# Patient Record
Sex: Male | Born: 1935 | Race: White | Hispanic: No | Smoking: Never smoker
Health system: Southern US, Community
[De-identification: ages and names within clinical notes are randomized; demographics above are authoritative.]

## PROBLEM LIST (undated history)

## (undated) DIAGNOSIS — G4730 Sleep apnea, unspecified: Secondary | ICD-10-CM

## (undated) DIAGNOSIS — C61 Malignant neoplasm of prostate: Secondary | ICD-10-CM

## (undated) DIAGNOSIS — R3915 Urgency of urination: Secondary | ICD-10-CM

## (undated) DIAGNOSIS — I639 Cerebral infarction, unspecified: Secondary | ICD-10-CM

## (undated) DIAGNOSIS — N400 Enlarged prostate without lower urinary tract symptoms: Secondary | ICD-10-CM

## (undated) DIAGNOSIS — K429 Umbilical hernia without obstruction or gangrene: Secondary | ICD-10-CM

## (undated) DIAGNOSIS — N419 Inflammatory disease of prostate, unspecified: Secondary | ICD-10-CM

## (undated) DIAGNOSIS — I509 Heart failure, unspecified: Secondary | ICD-10-CM

## (undated) DIAGNOSIS — E119 Type 2 diabetes mellitus without complications: Secondary | ICD-10-CM

## (undated) DIAGNOSIS — D560 Alpha thalassemia: Secondary | ICD-10-CM

## (undated) DIAGNOSIS — R600 Localized edema: Secondary | ICD-10-CM

## (undated) DIAGNOSIS — D649 Anemia, unspecified: Secondary | ICD-10-CM

## (undated) DIAGNOSIS — I10 Essential (primary) hypertension: Secondary | ICD-10-CM

## (undated) DIAGNOSIS — E7800 Pure hypercholesterolemia, unspecified: Secondary | ICD-10-CM

## (undated) DIAGNOSIS — B351 Tinea unguium: Secondary | ICD-10-CM

## (undated) HISTORY — DX: Inflammatory disease of prostate, unspecified: N41.9

## (undated) HISTORY — DX: Alpha thalassemia: D56.0

## (undated) HISTORY — DX: Sleep apnea, unspecified: G47.30

## (undated) HISTORY — DX: Urgency of urination: R39.15

## (undated) HISTORY — DX: Heart failure, unspecified: I50.9

## (undated) HISTORY — PX: HERNIA REPAIR: SHX51

## (undated) HISTORY — PX: VASECTOMY: SHX75

## (undated) HISTORY — PX: MASTOIDECTOMY: SHX711

## (undated) HISTORY — DX: Benign prostatic hyperplasia without lower urinary tract symptoms: N40.0

## (undated) HISTORY — PX: ADENOIDECTOMY: SUR15

## (undated) HISTORY — PX: TONSILLECTOMY: SUR1361

## (undated) HISTORY — DX: Localized edema: R60.0

## (undated) HISTORY — DX: Tinea unguium: B35.1

---

## 1999-09-15 ENCOUNTER — Ambulatory Visit (HOSPITAL_COMMUNITY): Admission: RE | Admit: 1999-09-15 | Discharge: 1999-09-15 | Payer: Self-pay | Admitting: Gastroenterology

## 1999-09-15 ENCOUNTER — Encounter (INDEPENDENT_AMBULATORY_CARE_PROVIDER_SITE_OTHER): Payer: Self-pay

## 2002-05-08 ENCOUNTER — Inpatient Hospital Stay (HOSPITAL_COMMUNITY): Admission: EM | Admit: 2002-05-08 | Discharge: 2002-05-13 | Payer: Self-pay | Admitting: Emergency Medicine

## 2002-05-08 ENCOUNTER — Encounter: Payer: Self-pay | Admitting: Neurology

## 2002-05-08 ENCOUNTER — Encounter: Payer: Self-pay | Admitting: Emergency Medicine

## 2002-05-09 ENCOUNTER — Encounter: Payer: Self-pay | Admitting: Neurology

## 2002-05-09 ENCOUNTER — Encounter: Payer: Self-pay | Admitting: Cardiology

## 2002-06-05 ENCOUNTER — Inpatient Hospital Stay (HOSPITAL_COMMUNITY): Admission: EM | Admit: 2002-06-05 | Discharge: 2002-06-06 | Payer: Self-pay | Admitting: Emergency Medicine

## 2002-06-05 ENCOUNTER — Encounter: Payer: Self-pay | Admitting: Emergency Medicine

## 2002-08-25 ENCOUNTER — Ambulatory Visit (HOSPITAL_COMMUNITY): Admission: RE | Admit: 2002-08-25 | Discharge: 2002-08-25 | Payer: Self-pay | Admitting: Gastroenterology

## 2004-08-16 ENCOUNTER — Ambulatory Visit: Admission: RE | Admit: 2004-08-16 | Discharge: 2004-09-01 | Payer: Self-pay | Admitting: Radiation Oncology

## 2004-10-05 ENCOUNTER — Ambulatory Visit: Admission: RE | Admit: 2004-10-05 | Discharge: 2004-10-05 | Payer: Self-pay | Admitting: Radiation Oncology

## 2004-10-06 ENCOUNTER — Ambulatory Visit: Admission: RE | Admit: 2004-10-06 | Discharge: 2005-01-04 | Payer: Self-pay | Admitting: Radiation Oncology

## 2004-10-10 ENCOUNTER — Emergency Department (HOSPITAL_COMMUNITY): Admission: EM | Admit: 2004-10-10 | Discharge: 2004-10-10 | Payer: Self-pay | Admitting: Emergency Medicine

## 2005-02-16 ENCOUNTER — Ambulatory Visit: Admission: RE | Admit: 2005-02-16 | Discharge: 2005-02-16 | Payer: Self-pay | Admitting: Radiation Oncology

## 2007-02-20 ENCOUNTER — Encounter: Admission: RE | Admit: 2007-02-20 | Discharge: 2007-02-20 | Payer: Self-pay | Admitting: Internal Medicine

## 2007-02-20 ENCOUNTER — Inpatient Hospital Stay (HOSPITAL_COMMUNITY): Admission: AD | Admit: 2007-02-20 | Discharge: 2007-02-25 | Payer: Self-pay | Admitting: Internal Medicine

## 2007-11-08 ENCOUNTER — Encounter: Admission: RE | Admit: 2007-11-08 | Discharge: 2007-11-08 | Payer: Self-pay | Admitting: Cardiology

## 2007-11-18 ENCOUNTER — Ambulatory Visit (HOSPITAL_COMMUNITY): Admission: RE | Admit: 2007-11-18 | Discharge: 2007-11-18 | Payer: Self-pay | Admitting: Cardiology

## 2008-06-03 ENCOUNTER — Inpatient Hospital Stay (HOSPITAL_COMMUNITY): Admission: EM | Admit: 2008-06-03 | Discharge: 2008-06-05 | Payer: Self-pay | Admitting: Emergency Medicine

## 2008-06-04 ENCOUNTER — Ambulatory Visit: Payer: Self-pay | Admitting: Vascular Surgery

## 2008-06-04 ENCOUNTER — Encounter (INDEPENDENT_AMBULATORY_CARE_PROVIDER_SITE_OTHER): Payer: Self-pay | Admitting: Internal Medicine

## 2009-03-19 ENCOUNTER — Inpatient Hospital Stay (HOSPITAL_COMMUNITY): Admission: EM | Admit: 2009-03-19 | Discharge: 2009-03-21 | Payer: Self-pay | Admitting: Emergency Medicine

## 2010-12-07 ENCOUNTER — Inpatient Hospital Stay (HOSPITAL_COMMUNITY)
Admission: EM | Admit: 2010-12-07 | Discharge: 2010-12-11 | Payer: Self-pay | Source: Home / Self Care | Attending: Internal Medicine | Admitting: Internal Medicine

## 2010-12-12 LAB — CBC
HCT: 39.1 % (ref 39.0–52.0)
Hemoglobin: 12.3 g/dL — ABNORMAL LOW (ref 13.0–17.0)
MCH: 21.8 pg — ABNORMAL LOW (ref 26.0–34.0)
MCHC: 31.5 g/dL (ref 30.0–36.0)
MCV: 69.4 fL — ABNORMAL LOW (ref 78.0–100.0)
Platelets: 200 10*3/uL (ref 150–400)
RBC: 5.63 MIL/uL (ref 4.22–5.81)
RDW: 14.2 % (ref 11.5–15.5)
WBC: 9.7 10*3/uL (ref 4.0–10.5)

## 2010-12-12 LAB — COMPREHENSIVE METABOLIC PANEL
ALT: 21 U/L (ref 0–53)
AST: 25 U/L (ref 0–37)
Albumin: 3.8 g/dL (ref 3.5–5.2)
Alkaline Phosphatase: 47 U/L (ref 39–117)
BUN: 16 mg/dL (ref 6–23)
CO2: 32 mEq/L (ref 19–32)
Calcium: 9.9 mg/dL (ref 8.4–10.5)
Chloride: 99 mEq/L (ref 96–112)
Creatinine, Ser: 1.2 mg/dL (ref 0.4–1.5)
GFR calc Af Amer: >60 mL/min (ref >60)
GFR calc non Af Amer: 59 mL/min — ABNORMAL LOW (ref >60)
Glucose, Bld: 127 mg/dL — ABNORMAL HIGH (ref 70–99)
Potassium: 4 mEq/L (ref 3.5–5.1)
Sodium: 141 mEq/L (ref 135–145)
Total Bilirubin: 0.5 mg/dL (ref 0.3–1.2)
Total Protein: 6.7 g/dL (ref 6.0–8.3)

## 2010-12-12 LAB — CARDIAC PANEL(CRET KIN+CKTOT+MB+TROPI)
CK, MB: 1.9 ng/mL (ref 0.3–4.0)
Relative Index: 1 (ref 0.0–2.5)
Total CK: 188 U/L (ref 7–232)
Total CK: 188 U/L (ref 7–232)
Total CK: 141 U/L (ref 7–232)
Troponin I: 0.02 ng/mL (ref 0.00–0.06)

## 2010-12-12 LAB — ACETAMINOPHEN LEVEL: Acetaminophen (Tylenol), Serum: <10 ug/mL — ABNORMAL LOW (ref 10–30)

## 2010-12-12 LAB — GLUCOSE, CAPILLARY
Glucose-Capillary: 192 mg/dL — ABNORMAL HIGH (ref 70–99)
Glucose-Capillary: 139 mg/dL — ABNORMAL HIGH (ref 70–99)
Glucose-Capillary: 150 mg/dL — ABNORMAL HIGH (ref 70–99)
Glucose-Capillary: 157 mg/dL — ABNORMAL HIGH (ref 70–99)
Glucose-Capillary: 170 mg/dL — ABNORMAL HIGH (ref 70–99)
Glucose-Capillary: 155 mg/dL — ABNORMAL HIGH (ref 70–99)
Glucose-Capillary: 179 mg/dL — ABNORMAL HIGH (ref 70–99)

## 2010-12-12 LAB — DIFFERENTIAL
Basophils Absolute: 0 10*3/uL (ref 0.0–0.1)
Basophils Relative: 0 % (ref 0–1)
Eosinophils Absolute: 0.2 10*3/uL (ref 0.0–0.7)
Eosinophils Relative: 2 % (ref 0–5)
Lymphocytes Relative: 14 % (ref 12–46)
Lymphs Abs: 1.4 10*3/uL (ref 0.7–4.0)
Monocytes Absolute: 1.1 10*3/uL — ABNORMAL HIGH (ref 0.1–1.0)
Monocytes Relative: 12 % (ref 3–12)
Neutro Abs: 6.9 10*3/uL (ref 1.7–7.7)
Neutrophils Relative %: 72 % (ref 43–77)

## 2010-12-12 LAB — URINALYSIS, ROUTINE W REFLEX MICROSCOPIC
Bilirubin Urine: NEGATIVE
Hgb urine dipstick: NEGATIVE
Ketones, ur: NEGATIVE mg/dL
Nitrite: NEGATIVE
Protein, ur: NEGATIVE mg/dL
Specific Gravity, Urine: 1.017 (ref 1.005–1.030)
Urine Glucose, Fasting: NEGATIVE mg/dL
Urobilinogen, UA: 1 mg/dL (ref 0.0–1.0)
pH: 7.5 (ref 5.0–8.0)

## 2010-12-12 LAB — CK TOTAL AND CKMB (NOT AT ARMC)
CK, MB: 2.1 ng/mL (ref 0.3–4.0)
Relative Index: 1.2 (ref 0.0–2.5)

## 2010-12-12 LAB — URINE CULTURE: Culture  Setup Time: 201201190030

## 2010-12-12 LAB — RAPID URINE DRUG SCREEN, HOSP PERFORMED
Barbiturates: NOT DETECTED
Opiates: POSITIVE — AB

## 2010-12-12 LAB — PROTIME-INR
INR: 2.3 — ABNORMAL HIGH (ref 0.00–1.49)
INR: 2.37 — ABNORMAL HIGH (ref 0.00–1.49)
Prothrombin Time: 25.4 seconds — ABNORMAL HIGH (ref 11.6–15.2)
Prothrombin Time: 26 seconds — ABNORMAL HIGH (ref 11.6–15.2)

## 2010-12-12 LAB — VITAMIN B12: Vitamin B-12: 340 pg/mL (ref 211–911)

## 2010-12-12 LAB — SEDIMENTATION RATE: Sed Rate: 2 mm/hr (ref 0–16)

## 2010-12-12 LAB — TROPONIN I: Troponin I: <0.01 ng/mL (ref 0.00–0.06)

## 2010-12-12 LAB — APTT: aPTT: 43 seconds — ABNORMAL HIGH (ref 24–37)

## 2010-12-13 LAB — DIFFERENTIAL
Basophils Relative: 0 % (ref 0–1)
Eosinophils Absolute: 0.3 10*3/uL (ref 0.0–0.7)
Eosinophils Relative: 4 % (ref 0–5)
Lymphs Abs: 1.6 10*3/uL (ref 0.7–4.0)
Monocytes Absolute: 1.2 10*3/uL — ABNORMAL HIGH (ref 0.1–1.0)

## 2010-12-13 LAB — GLUCOSE, CAPILLARY
Glucose-Capillary: 162 mg/dL — ABNORMAL HIGH (ref 70–99)
Glucose-Capillary: 149 mg/dL — ABNORMAL HIGH (ref 70–99)
Glucose-Capillary: 139 mg/dL — ABNORMAL HIGH (ref 70–99)
Glucose-Capillary: 174 mg/dL — ABNORMAL HIGH (ref 70–99)

## 2010-12-13 LAB — PROTEIN ELECTROPHORESIS, SERUM
Albumin ELP: 57.9 % (ref 55.8–66.1)
Alpha-1-Globulin: 4.4 % (ref 2.9–4.9)
Alpha-2-Globulin: 13.5 % — ABNORMAL HIGH (ref 7.1–11.8)
Beta 2: 5.1 % (ref 3.2–6.5)

## 2010-12-13 LAB — CBC
MCH: 21.4 pg — ABNORMAL LOW (ref 26.0–34.0)
MCV: 67.9 fL — ABNORMAL LOW (ref 78.0–100.0)
Platelets: 218 10*3/uL (ref 150–400)
RDW: 14.4 % (ref 11.5–15.5)
WBC: 8.6 10*3/uL (ref 4.0–10.5)

## 2010-12-13 LAB — VITAMIN D 1,25 DIHYDROXY: Vitamin D3 1, 25 (OH)2: 43 pg/mL

## 2010-12-13 LAB — PROTIME-INR
INR: 2.05 — ABNORMAL HIGH (ref 0.00–1.49)
INR: 2.55 — ABNORMAL HIGH (ref 0.00–1.49)
Prothrombin Time: 27.5 seconds — ABNORMAL HIGH (ref 11.6–15.2)

## 2010-12-27 NOTE — Discharge Summary (Signed)
Jared Hall, Jared Hall                   ACCOUNT NO.:  [id number]  MEDICAL RECORD NO.:  [id number]          PATIENT TYPE:  INP  LOCATION:  3737                         FACILITY:  [facility]  PHYSICIAN:  Rosalyn Gess. Norins, MD  DATE OF BIRTH:  January 28, 1931  DATE OF ADMISSION:  12/07/2010 DATE OF DISCHARGE:  12/11/2010                              DISCHARGE SUMMARY   ADMITTING DIAGNOSES: 1. Fall. 2. Chronic venous stasis. 3. Diabetes. 4. Hypertension. 5. Paroxysmal atrial fibrillation. 6. Benign prostatic hypertrophy. 7. Hyperlipidemia. 8. Peripheral neuropathy. 9. Obesity.  DISCHARGE DIAGNOSES: 1. Fall. 2. Chronic venous stasis. 3. Diabetes. 4. Hypertension. 5. Paroxysmal atrial fibrillation. 6. Benign prostatic hypertrophy. 7. Hyperlipidemia. 8. Peripheral neuropathy. 9. Obesity.  CONSULTANTS:  None.  PROCEDURES: 1. CT scan of the cervical spine without contrast performed at     admission which showed no evidence of acute cervical spine     fracture, traumatic subluxation, or static signs of instability.     He had diffuse spondylosis. 2. CT scan of the head without contrast which showed no acute     intracranial or calvarial findings.  Stable small-vessel ischemic     changes and old right cerebellar infarct is noted. 3. 2-D echo performed on January 21 which revealed left ventricular     wall thickness to be increased consistent with moderate LVH.  EF     was estimated at 60 to 65% with normal wall motion with no wall     motion abnormalities.  Doppler parameters are consistent with     abnormal left ventricular relaxation, grade 1 diastolic     dysfunction.  Aortic valve with trivial regurgitation.  Mitral     valve with calcified annulus.  Left atrium was mildly dilated,     right ventricle cavity size was mildly dilated.  HISTORY OF PRESENT ILLNESS:  The patient is an obese 77 year old Caucasian male followed by Dr. [name] with a history of  diabetes, hypertension, chronic venous stasis, and PAF who is anticoagulated.  He was seen in the emergency department on the day of admission with difficulty in ambulation as a baseline and a fall at home.  His fall was actually when he hit his foot on a chair and tripped with no loss of consciousness, no blurry vision, no chest pain, chest discomfort, or palpitations.  The patient reports he had 2 episodes of fall.  After the fall, he had extreme difficulty in getting up and ambulating and subsequently called on today to alert the EMS who brought the patient to the hospital.  Please see the H and P as well as the attached notes from Dr. Kevan Hall' office for past medical history, family history, social history, medications, and physical exam.  HOSPITAL COURSE:  The patient was admitted to a telemetry floor.  He remained in stable rate-controlled atrial fibrillation.  He was awake, alert, and was able to ambulate.  He was seen by physical therapy who recommended 24 hours supervision at discharge along with home health physical therapy.  With adequate supervision, skilled care was recommended.  The patient remained otherwise medically stable.  The patient is adamant about not going to a skilled care facility wants to remain in his own home.  Conversation was had with his son and family has agreed to arrange to look in on him or check with him by phone during the day since they all work.  I have advised the family to consider life alert to have for the patient when he is not attended.  We will arrange for home health physical therapy, occupational therapy, as well as fall risk assessment and recommendations for this patient.  The patient is being medically stable, and thought to be ready for discharge home.  DISCHARGE MEDICATIONS:  The patient will resume all his home medications per the medication reconciliation discharge form.  The patient will be seen by Dr. [name] in followup  in 10-14 days.  We will request home health as noted.  DISCHARGE EXAMINATION:  VITAL SIGNS:  Temperature 98.5, blood pressure 133/71, heart rate 62, respirations 18, and O2 sats 97% on room air. Orthostatic vital signs with a blood pressure of 133/71, pulse 62 lying down, blood pressure 129/70 with pulse of 83 sitting, blood pressure 110/64 with a pulse of 87 standing. GENERAL APPEARANCE:  This is an obese Caucasian elderly male sitting in a chair in no acute distress. HEENT:  Unremarkable. CHEST:  Patient is moving air well with no rales, wheezes, or rhonchi. No increased work of breathing. CARDIOVASCULAR:  2+ radial pulses.  Precordium was quiet.  He had a distant heart sound that was irregularly irregular but rate controlled. ABDOMEN:  Protuberant, positive bowel sounds were noted.  No guarding or rebound was noted.  No further examination conducted.  FINAL LABORATORY:  INR on the day of discharge was 2.55 and therapeutic. CBC from December 10, 2010, with a white count of 8600, hemoglobin 11.6 grams, hematocrit 36.8%, platelet count 280,000.  Differential was normal.  BNP from January 21 was less than 30.  Cardiac panels were negative with no evidence of cardiac injury.  B12 on December 08, 2010, was normal at 340.  TSH on January 19 was normal at 0.99.  ESR on January 19 was normal at 2.  DISPOSITION:  The patient to be discharged home in care of his family with home health as noted.  The patient's condition is stable but guarded given his multiple comorbidities.  Thank you very much for your assistance.     Rosalyn Gess Norins, MD     MEN/MEDQ  D:  12/11/2010  T:  12/12/2010  Job:  [id number]  cc:   Candyce Churn, M.D.  Electronically Signed by Illene Regulus MD on 12/27/2010 07:24:11 AM

## 2011-02-28 LAB — PROTIME-INR
INR: 3 — ABNORMAL HIGH (ref 0.00–1.49)
INR: 3.1 — ABNORMAL HIGH (ref 0.00–1.49)

## 2011-02-28 LAB — BASIC METABOLIC PANEL
CO2: 33 mEq/L — ABNORMAL HIGH (ref 19–32)
Chloride: 104 mEq/L (ref 96–112)
Creatinine, Ser: 1.13 mg/dL (ref 0.4–1.5)
GFR calc Af Amer: >60 mL/min (ref >60)
Glucose, Bld: 166 mg/dL — ABNORMAL HIGH (ref 70–99)

## 2011-02-28 LAB — GLUCOSE, CAPILLARY
Glucose-Capillary: 109 mg/dL — ABNORMAL HIGH (ref 70–99)
Glucose-Capillary: 162 mg/dL — ABNORMAL HIGH (ref 70–99)
Glucose-Capillary: 176 mg/dL — ABNORMAL HIGH (ref 70–99)
Glucose-Capillary: 213 mg/dL — ABNORMAL HIGH (ref 70–99)

## 2011-03-01 LAB — BASIC METABOLIC PANEL
BUN: 26 mg/dL — ABNORMAL HIGH (ref 6–23)
Calcium: 9 mg/dL (ref 8.4–10.5)
Chloride: 98 mEq/L (ref 96–112)
GFR calc Af Amer: >60 mL/min (ref >60)
GFR calc non Af Amer: 55 mL/min — ABNORMAL LOW (ref >60)
GFR calc non Af Amer: >60 mL/min (ref >60)
Potassium: 3 mEq/L — ABNORMAL LOW (ref 3.5–5.1)
Sodium: 137 mEq/L (ref 135–145)

## 2011-03-01 LAB — COMPREHENSIVE METABOLIC PANEL
ALT: 28 U/L (ref 0–53)
AST: 27 U/L (ref 0–37)
Albumin: 3.5 g/dL (ref 3.5–5.2)
Alkaline Phosphatase: 73 U/L (ref 39–117)
Chloride: 98 mEq/L (ref 96–112)
Creatinine, Ser: 1.98 mg/dL — ABNORMAL HIGH (ref 0.4–1.5)
GFR calc Af Amer: 40 mL/min — ABNORMAL LOW (ref >60)
Potassium: 3.5 mEq/L (ref 3.5–5.1)
Sodium: 138 mEq/L (ref 135–145)
Total Bilirubin: 0.5 mg/dL (ref 0.3–1.2)

## 2011-03-01 LAB — URINALYSIS, ROUTINE W REFLEX MICROSCOPIC
Nitrite: NEGATIVE
Protein, ur: NEGATIVE mg/dL
Specific Gravity, Urine: 1.022 (ref 1.005–1.030)
Urobilinogen, UA: 1 mg/dL (ref 0.0–1.0)

## 2011-03-01 LAB — POCT CARDIAC MARKERS
Troponin i, poc: <0.05 ng/mL (ref 0.00–0.09)
Troponin i, poc: <0.05 ng/mL (ref 0.00–0.09)

## 2011-03-01 LAB — PROTIME-INR: INR: 2.8 — ABNORMAL HIGH (ref 0.00–1.49)

## 2011-03-01 LAB — DIFFERENTIAL
Basophils Absolute: 0 10*3/uL (ref 0.0–0.1)
Lymphs Abs: 0.9 10*3/uL (ref 0.7–4.0)
Monocytes Relative: 10 % (ref 3–12)
Neutro Abs: 6.4 10*3/uL (ref 1.7–7.7)

## 2011-03-01 LAB — HEMOGLOBIN A1C: Mean Plasma Glucose: 212 mg/dL

## 2011-03-01 LAB — CBC
Platelets: 212 10*3/uL (ref 150–400)
WBC: 8.3 10*3/uL (ref 4.0–10.5)

## 2011-03-01 LAB — GLUCOSE, CAPILLARY
Glucose-Capillary: 147 mg/dL — ABNORMAL HIGH (ref 70–99)
Glucose-Capillary: 154 mg/dL — ABNORMAL HIGH (ref 70–99)
Glucose-Capillary: 196 mg/dL — ABNORMAL HIGH (ref 70–99)

## 2011-04-04 NOTE — Discharge Summary (Signed)
Jared Hall, Jared Hall                   ACCOUNT NO.:  [id number]   MEDICAL RECORD NO.:  [id number]          PATIENT TYPE:  INP   LOCATION:  4729                         FACILITY:  [facility]   PHYSICIAN:  Candyce Churn, M.D.DATE OF BIRTH:  [birth year]   DATE OF ADMISSION:  06/03/2008  DATE OF DISCHARGE:  06/05/2008                               DISCHARGE SUMMARY   DISCHARGE DIAGNOSES:  1. Syncope, likely multifactorial including relative dehydration.  No      further recurrences after 48 hours observation.  2. Mild nocturnal bradycardia, asymptomatic.  3. Azotemia with a BUN of 31, creatinine 1.7 on admission.  4. Mild dehydration.  5. Hyperlipidemia.  6. History of B12 deficiency.  7. 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 Gastroesophageal reflux disease.  9. Chest pain syndrome in the past felt to be secondary to      gastroesophageal reflux disease.  10.Sleep apnea with chronic nocturnal O2 therapy.  11.Severe obesity.  12.Type 2 diabetes mellitus, insulin-requiring.  13.History of small-bowel obstruction with hospitalization in Mar 28, 2007.  Resolved spontaneously.  14.Degenerative joint disease of the knees.  15.The patient had work up for chest pain syndrome in July 2003      revealing no significant coronary artery disease.  16.History of recurrent prostatitis.  17.History of colonic polyps.   DISCHARGE MEDICATIONS:  1. Vesicare 5 mg p.o. at bedtime for BPH.  2. Discontinue metolazone.  3. Simvastatin 20 mg daily.  4. Finasteride 5 mg daily.  5. Coumadin 5 mg daily.  6. Glimepiride 4 mg daily.  7. Lasix 80 mg daily.  8. Potassium chloride 20 mEq b.i.d.  9. Vicodin 10/750, one tablet p.o. p.r.n. pain.  10.Retain Lantus 30 units subcu b.i.d.   HOSPITAL COURSE:  Mr. Settlemire is a very pleasant 77 year old male with  multiple medical issues who was bending over to tie up a trash can  plastic bag besides his wife's bed and when  he stood up from that, he  thinks that he passed out because he found himself sitting in a  reclining chair besides her bed half in and half out of the chair.  He  tried to stand up from the chair after resting and had near syncope.  At  that point an EMS was called and he was brought to [facility] Emergency  Room.  He was found to be relatively dehydrated and azotemic.  His blood  sugar after the initial syncopal episode was 314.   In the emergency room, a CT scan was performed of the brain revealing no  acute intracranial abnormalities.  INR was therapeutic at 2.0 for his  previous stroke.  He was admitted for observation and had no further  symptoms while hospitalized.  The EEG results are still pending.  Carotid Dopplers revealed no significant internal carotid artery  stenoses.  Results of 2-D echo are performed on June 04, 2008, and are  pending.  Again, results of EEG are pending.   DISCHARGE LABORATORIES:  Protime  2.1 seconds on June 05, 2008.  Cardiac  enzymes revealed mildly elevated CK 258 on June 04, 2008, but with a CK-  MB of 2.2 and a troponin of 0.01.  Four other sets of cardiac enzymes  were all normal except for mildly elevated CK at 200-340.  This is most  likely secondary to his fall into the chair more than likely.   TSH from June 04, 2008, was normal at 0.973.  C-MET from June 04, 2008,  reveals sodium 140, potassium 4.8, chloride 101, bicarb 33, glucose 156,  BUN 30, creatinine 1.43, bilirubin 1.6, alk phos 47, SGOT 60, SGPT 30,  total protein 5.8, albumin 3.3, and calcium 9.1.  CBC from June 04, 2008, revealed a white cell count 8900, hemoglobin 12.2, MCV low at 69  with known history of alpha thalassemia.  Platelet count 238,000.   The patient was discharged home in improved condition, able to sit up,  stand, and ambulate to the bathroom back in for without symptoms.  No  exertional chest or arm pain.   Plans will be to discontinue metolazone and  reassess  in 2 weeks.  We  will consider Holter monitor for nocturnal bradycardia.      Candyce Churn, M.D.  Electronically Signed     RNG/MEDQ  D:  06/05/2008  T:  06/05/2008  Job:  [id number]

## 2011-04-04 NOTE — H&P (Signed)
NAME:  Jared Hall, Jared Hall                   ACCOUNT NO.:  [id number]   MEDICAL RECORD NO.:  [id number]          PATIENT TYPE:  INP   LOCATION:  4729                         FACILITY:  [facility]   PHYSICIAN:  Kela Millin, M.D.DATE OF BIRTH:  03/17/1931   DATE OF ADMISSION:  06/03/2008  DATE OF DISCHARGE:                              HISTORY & PHYSICAL   PRIMARY CARE PHYSICIAN:  Candyce Churn, M.D.   CHIEF COMPLAINT:  Syncopal episodes x2.   HISTORY OF PRESENT ILLNESS:  The patient is an obese 77 year old white  male with a past medical history significant for type 2 diabetes,  hypertension, vitamin B-12 deficiency, history of chest pain syndrome in  the past diagnosed with GERD, history of CVA on chronic Coumadin,  degenerative disk disease, alpha thalassemia with low MCV, sleep apnea  on nocturnal O2, who presents with the above complaints.  He states that  he is usually the caregiver for his wife, and he was standing up trying  to give her some juice today, and when he turned around, everything went  black.  He fell onto with a nearby chair and was unconscious for an  unknown duration of time.  When he came to, he checked his blood  glucose; it was 314, and he gave himself his scheduled dose of Lantus.  He then sat in the chair for some time and later on, as he tried to get  up again from the chair, he again passed out, fell back into the chair,  and again lost consciousness.  He states that by the second time, his  daughter was at the house, and they called 9-1-1, but she is not  available at this time, and the patient does not know for how long he  was unconscious.  He reports that he was incontinent of urine after the  first episode.  He admits to feeling dizzy for the past 2-3 days.  He  denies palpitations, blurry vision, focal weakness, chest pain, fevers,  melena, hematochezia, diarrhea, nausea and vomiting, headaches and no  dysphagia.  He admits to feeling short of breath  and weak sometimes in  the past couple of days.  He denies leg swelling; also no PND.  He  states that when EMS came by and they checked his blood glucose (after  he had given himself Lantus) it was 195.   In the ER, a CT scan was done which showed no acute intracranial  findings and no mass lesions.  Vascular calcifications and  dolichoechasia of the left internal carotid artery was noted.  Periventricular white matter disease and remote lacunar type infarcts  also noted with cerebral and more marked cerebellar atrophy.  A  urinalysis was done and was negative for infection, and his INR was  therapeutic at 2.0.  Point of care markers negative x1.  His BUN was 31  with a creatinine of 1.7.  He is admitted for further evaluation and  management.   PAST MEDICAL HISTORY:  1. As above.  2. History of small bowel obstruction.  3. History of recurrent  prostatitis.  4. History of colonic polyps; last colonoscopy in 2003.   MEDICATIONS:  1. Metolazone 5 mg on Mondays, Wednesdays and Fridays.  2. Coumadin 5 mg.  3. Amaryl 4 mg.  4. Lasix 80 mg daily.  5. KCl 20.  6. Simvastatin 20 mg.  7. Vesicare 5 mg daily.  8. Vicodin 10/750 b.i.d. p.r.n.  9. Finasteride 5 mg daily.  10.Lantus 40 units in a.m. and 30 units in p.m. per patient report      (but on his Lantus package, it is listed as 30 units b.i.d.)   ALLERGIES:  No known drug allergies.   SOCIAL HISTORY:  He denies tobacco.  He also denies alcohol.   FAMILY HISTORY:  Reviewed and noncontributory to current illness.   REVIEW OF SYSTEMS:  As per HPI.  Other review of systems negative.   PHYSICAL EXAMINATION:  GENERAL:  The patient is an obese elderly white  male in no apparent distress.  VITAL SIGNS:  Temperature is 97, blood pressure 113/76, pulse 100,  respiratory rate of 20, O2 saturation 96%.  HEENT - Pupils equal, round and reactive to light.  Extraocular  movements intact.  Sclerae anicteric.  Slightly dry mucous membranes  and  no oral exudates.  NECK:  Supple.  No adenopathy, no thyromegaly, no JVD and no carotid  bruits appreciated.  LUNGS:  Decreased breath sounds at the bases.  No crackles and no  wheezes.  CARDIOVASCULAR:  Regular rate and rhythm.  Normal S1 and S2.  No S4  appreciated.  ABDOMEN:  Soft.  Bowel sounds present.  Nontender, nondistended.  No  organomegaly and no masses palpable.  EXTREMITIES:  No cyanosis and no edema.  NEUROLOGIC:  She is alert and oriented x3.  Cranial nerves II-XII  grossly intact.  Strength is 5/5 and symmetric.  Nonfocal.   LABORATORY DATA:  CT scan as per HPI.  Urinalysis is negative for  infection.  Sodium is 141 with a potassium of 3.6, chloride 99, BUN is  31, creatinine 1.7, glucose 151, ionized calcium 1.20.  The white cell  count is 9.1, hemoglobin 12.7, hematocrit 40.7, MCV is 70, platelet  count 255, neutrophil count 75%.  Point of care markers negative x1.  INR is 2.0.   ASSESSMENT AND PLAN:  1. Syncope - as discussed above.  CT scan of the head negative for      acute findings.  We will obtain serial cardiac enzymes, monitor on      telemetry.  Also a 2-D echo, carotid Doppler ultrasound and an EEG.      Will check orthostatic vital signs and a D-dimer.  Follow and      further manage as appropriate pending results.  2. Renal insufficiency.  Obtain baseline creatinine from office      records.  Will hold diuretics for now.  Conscious hydration.      Follow and recheck.  3. Diabetes.  Monitor Accu-Cheks.  Lantus.  Hold off Amaryl for now.      Monitor blood sugars and resume when appropriate.  4. Hypertension.  Was on diuretics; will hold as discussed above.      Monitor blood pressures and treat as appropriate.  5. Gastroesophageal reflux disease.  PPI.  6. History of cerebrovascular accident, on chronic Coumadin.  INR      therapeutic.  Continue monitoring.  7. History of sleep apnea.  Continue nocturnal O2.  8. History of degenerative joint  disease.  Continue outpatient  medications.  9. History of sleep apnea, on nocturnal O2.  10.History of alpha thalassemia , with low MCV as discussed above.      Kela Millin, M.D.  Electronically Signed     ACV/MEDQ  D:  06/04/2008  T:  06/04/2008  Job:  [id number]   cc:   Candyce Churn, M.D.

## 2011-04-04 NOTE — H&P (Signed)
Jared Hall, Jared Hall                   ACCOUNT NO.:  [id number]   MEDICAL RECORD NO.:  [id number]          PATIENT TYPE:  EMS   LOCATION:  MAJO                         FACILITY:  [facility]   PHYSICIAN:  Hollice Espy, M.D.DATE OF BIRTH:  1931/01/07   DATE OF ADMISSION:  03/18/2009  DATE OF DISCHARGE:                              HISTORY & PHYSICAL   ATTENDING PHYSICIAN:  Hollice Espy, M.D.   PRIMARY CARE PHYSICIAN:  Candyce Churn, M.D.   CHIEF COMPLAINT:  Fall and passing out.   HISTORY OF PRESENT ILLNESS:  The patient is a 77 year old white male  with past medical history of obesity, diabetes mellitus and atrial  fibrillation who early on today became dizzy while walking in the  bathroom.  He had been seen by his PCP and was having lower extremity  edema in the last couple of weeks and had some metolazone added.  Today  he felt lightheaded then fell forward on to his face.  Not sure if he  passed out.  If so it was for less than a minute.  He had just a little  bit of bleeding from his nose afterwards.  Paramedics were called and  when he arrived to the emergency room his bleeding had been stopped.  He  complained of some severe dizziness especially when standing upright,  nausea and he had some initial urinary incontinence as well.  The  patient's blood pressure, when he first came in he was noted to be  slightly tachycardic.  His vitals on admission noted a blood pressure of  140/71, heart rate of 97.  The patient was started on IV fluids.  When  sitting upright he starts to become tachycardic.  An EKG done showed  some normal sinus rhythm.  He has occasional ectopic beats and at one  point paramedics thought he might be in first degree block, although EKG  here is definitely normal sinus rhythm with occasional ectopic beats.  Lab work was done.  The patient was found to have a normal white count,  no shift, normal hemoglobin.  His BUN and creatinine looked to be  acutely elevated with a BUN of 39 and creatinine of 1.98 and a glucose  of 201.  His INR stayed therapeutic at 2.4.  CPKs showed greater than  500 but normal MB and troponins consistent with a fall.  When I saw the  patient he was complaining of some dizziness, some nausea, a mild  headache.  No vision changes.  No dysphagia.  No chest pain.  No  palpitations.  No shortness of breath, wheeze, cough, abdominal pain,  hematuria, dysuria, constipation, diarrhea.  He complains of some  bilateral leg cramping and feels overall quite fatigued.   REVIEW OF SYSTEMS:  Otherwise negative.   PAST MEDICAL HISTORY:  Includes obesity, diabetes mellitus,  hypertension, history of paroxysmal atrial fibrillation on chronic  anticoagulation, hyperlipidemia, BPH and peripheral neuropathy secondary  to diabetes.   MEDICATIONS:  1. He is on metolazone 5.  2. Coumadin 5.  3. Glimepiride 4 b.i.d.  4. Lasix  80.  5. K-Dur 20 mEq b.i.d.  6. Zocor 20.  7. Vesicare 5.  8. Vicodin 10/750 b.i.d.  9. Finasteride 5.  10.Lantus 30 units subcu b.i.d.  11.Lyrica daily.   ALLERGIES:  No known drug allergies.   SOCIAL HISTORY:  No tobacco, alcohol or drug use.   FAMILY HISTORY:  Noncontributory.   PHYSICAL EXAMINATION:  VITAL SIGNS:  On admission temperature 96.7,  heart rate 97 up to as high as 117, now down to 94, blood pressure  140/71 when sitting upright.  He drops down to about as low as 116/63.  Respirations 20, O2 sat 97% on 2 liters.  GENERAL:  He is alert and oriented x3 and in no apparent distress.  HEENT:  Normocephalic.  He has signs of dried nosebleed, recent fall.  Mucous membranes are slightly dry.  NECK:  He has no carotid bruits.  HEART:  Currently regular rate and rhythm, S1, S2, 2/6 systolic ejection  murmur.  LUNGS:  Clear to auscultation bilaterally.  ABDOMEN:  Soft, obese, nontender, positive bowel sounds.  EXTREMITIES:  Show no clubbing, cyanosis.  Got about 1+ pitting edema.    LAB WORK:  White count 8.3, H and H 13.1 and 41, MCV 70, platelet count  212 and no shift.  Sodium 138, potassium 3.5, chloride 98, bicarb 33,  BUN 39, creatinine 2, glucose 201.  LFTs are unremarkable.  INR is 2.4.  Urinalysis unremarkable.  CPK greater than 500, MB 1.5, troponin-I less  than 0.05.  CT scan of the head notes no evidence of intracranial  trauma.  Chest x-ray notes decreased sensitivity and specificity but  overall cardiomegaly, low lung volumes and probable left based  atelectasis.   ASSESSMENT AND PLAN:  1. Syncope felt to be secondary to #2.  2. Acute renal failure which is secondary to likely from over      diuresis.  Will hold his antihypertensives, hold his diuretics,      gently hydrate, resume Lasix in the morning.  PT/OT to see.  Pain      control.  Will also continue his Lantus and other medications      including his Coumadin.  His INR is therapeutic.  The patient      likely will be able to be discharged tomorrow.  Please note that      the patient is a DNR.      Hollice Espy, M.D.  Electronically Signed     SKK/MEDQ  D:  03/18/2009  T:  03/18/2009  Job:  [id number]

## 2011-04-04 NOTE — Procedures (Signed)
REFERRING PHYSICIAN:  Candyce Churn, MD   CLINICAL HISTORY:  A 77 year old patient being evaluated for syncope.   MEDICATIONS LISTED:  NovoLog, Lantus, Protonix, Zocor, Coumadin,  Vicodin, Phenergan, and Tylenol.   This is a portable EEG recorded with the patient awake using 17-channel  machine and standard 10/20 electrode placement.   Background awake rhythm consists of 7-8 Hz alpha which is of diminished  amplitude, reactive to eye-opening and closure.  He intermittent 6-7 Hz  theta slowing as seen bilaterally in diffuse and symmetric fashion.  No  paroxysmal epileptiform activity.  Spikes are noted.  Length of the  recording is 23.7 minutes.  Definite sleep stages are not seen.  EKG  tracing reveals regular sinus rhythm.  Hyperventilation and photic  stimulation not performed.   IMPRESSION:  This EEG performed during awake state is borderline  abnormal due to mild background slowing.  No definite epileptiform  features are noted.           ______________________________  Sunny Schlein. Pearlean Brownie, MD     ZOX:WRUE  D:  06/04/2008 18:17:53  T:  06/05/2008 08:55:16  Job #:  [id number]   cc:   Candyce Churn, M.D.  Fax: [phone]

## 2011-04-04 NOTE — Cardiovascular Report (Signed)
NAMEWYLEE, Jared Hall                   ACCOUNT NO.:  [id number]   MEDICAL RECORD NO.:  [id number]          PATIENT TYPE:  OIB   LOCATION:  2899                         FACILITY:  [facility]   PHYSICIAN:  Armanda Magic, M.D.     DATE OF BIRTH:  01-Oct-1931   DATE OF PROCEDURE:  11/18/2007  DATE OF DISCHARGE:  11/18/2007                            CARDIAC CATHETERIZATION   PROCEDURES:  1. Left heart catheterization.  2. Coronary angiography.  3. Left ventriculography.   OPERATOR:  Armanda Magic, M.D.   INDICATIONS:  Shortness of breath and abnormal Cardiolite.   COMPLICATIONS:  None.   IV ACCESS:  Via right femoral artery with a 6-French sheath   This is a very pleasant 77 year old male with a history of type 2  diabetes, hypertension, GERD.  She presents with congestive heart  failure and lower extremity edema.  An adenosine Cardiolite showed a  small area of inferoapical and inferolateral ischemia.  He now presents  for cardiac catheterization.   The patient was brought to cardiac catheterization laboratory in the  fasting nonsedated state.  Informed consent was obtained.  The patient  was connected to continuous heart rate and pulse oximetry monitoring,  and intermittent blood pressure monitoring.  The right groin was prepped  and draped in a sterile fashion.  Xylocaine 1% was used for local  anesthesia.  Using the modified Seldinger technique, a 6-French sheath  was placed in the right femoral artery.  Under fluoroscopic guidance, a  6-French JL-4 catheter was placed in the left coronary artery.  Multiple  cineographic films were taken at 30-degree RAO and 40-degree LAO views.  The catheter was then exchanged out over a guidewire for a 6-French JR-  4.  The catheter was placed under fluoroscopic guidance to the right  coronary artery.  Multiple cine films were taken at 30-degree RAO and 40-  degree LAO views.  This catheter was then exchanged out over a guidewire  for a 6-French  angled pigtail catheter; which was placed under  fluoroscopic guidance to the left ventricular cavity.   Left ventriculography was performed in the 30-degree RAO view, using a  total of 30 mL of contrast at 15 mL per second.  The catheter was then  pulled back across the aortic valve, with no significant gradient noted.   At the end procedure all catheters and sheaths were removed.  Manual  compression was performed until adequate hemostasis was obtained.  The  patient was transferred back to the room in stable condition.   RESULTS:  1. Left main coronary artery was widely patent.  It bifurcates in the      left anterior descending artery and left circumflex artery.  2. The left anterior descending artery was widely patent throughout      its course to the apex; giving rise to one diagonal branch, which      was rather large.  Widely patent and bifurcates into 2 daughter      branches, both of which were widely patent.  3. The left circumflex traverses the AV groove and is  widely patent      throughout its course.  It gives rise to 3 obtuse marginal      branches, all of which were widely patent.  4. The right coronary artery was widely patent throughout its course,      and distally bifurcates into a posterior descending artery and      posterolateral artery; both of which are widely patent.   LEFT VENTRICULOGRAPHY:  1. Normal LV function; LVEDP 11 mmHg.  2. Aortic pressure 138/76 mmHg.  3. LV pressure 135/11 mmHg.   ASSESSMENT:  1. Normal coronary arteries.  2. Normal left ventricular function.  3. Shortness of breath.  4. Abnormal Cardiolite, secondary to obesity.   PLAN:  Discharge to home after IV fluids and bedrest.   FOLLOW-UP:  Groin check in 2 weeks, and follow up with me in 2-3 weeks.      Armanda Magic, M.D.  Electronically Signed     TT/MEDQ  D:  01/06/2008  T:  01/07/2008  Job:  [id number]   cc:   Candyce Churn, M.D.

## 2011-04-04 NOTE — Discharge Summary (Signed)
NAMEPAT, SIRES                   ACCOUNT NO.:  [id number]   MEDICAL RECORD NO.:  [id number]          PATIENT TYPE:  INP   LOCATION:  5125                         FACILITY:  [facility]   PHYSICIAN:  Theressa Millard, M.D.    DATE OF BIRTH:  1931/11/05   DATE OF ADMISSION:  03/18/2009  DATE OF DISCHARGE:  03/21/2009                               DISCHARGE SUMMARY   ADMITTING DIAGNOSIS:  Fall secondary to dehydration.   DISCHARGE DIAGNOSES:  1. Fall secondary to volume depletion.  2. History of peripheral edema and stasis dermatitis, status post      excessive diuresis.  3. Obesity.  4. Diabetes mellitus.  5. Hypertension.  6. Paroxysmal atrial fibrillation, on systemic anticoagulation.  7. Hyperlipidemia.  8. Benign prostatic hypertrophy.  9. Peripheral neuropathy secondary to diabetes.   The patient is a 77 year old white male who has had problems with lower  extremity edema, thought to be on the basis of his obesity.  He has had  a little bit of stasis dermatitis.  He was treated with Lasix and then  metolazone.  He became dizzy and lightheaded, and fell forward on his  face.  He came to the emergency room.   HOSPITAL COURSE:  Because of systemic anticoagulation, the patient was  admitted after a CT scan was negative.  This was done to be sure he did  not have any evidence of neurologic decline.  During his hospitalization  besides generalized weakness, he did fine.  There was no evidence of  serious neurologic problem.  His blood pressure which was initially  slightly low came up nicely.  Lasix and metolazone were both held during  the hospitalization.   He was not excessively anticoagulated, but despite this had bilateral  periorbital ecchymosis and bruising elsewhere in his body.  A CT scan of  the head revealed no facial trauma nor other evidence of intracranial  problem.   Atrial fibrillation remained under control.  In the hospital, his sugars  were acceptable in the 100  to low 200 range.  His hemoglobin A1c,  however, was 9.0.  It was also noted the patient has a very low  microcytic index, but no anemia.  This raised the possibility that he  has underlying thalassemia.   He was discharged in improved condition after evaluation by PT.  They  believe he will do well with the walker at home as well as home health  physical therapy.  This has been arranged.  His daughter-in-law will be  staying with him and his wife, who is demented, and I think they will do  acceptably well.   DISCHARGE MEDICATIONS:  1. Coumadin 5 mg one-half tablet daily.  2. Glimepiride 4 mg b.i.d.  3. Zocor 20 mg daily.  4. Vesicare 5 mg daily.  5. Vicodin 10/750 two times a day.  6. Finasteride 5 mg daily.  7. Lantus 40 units twice daily.  8. Lyrica 50 mg daily.  He is to hold Lasix and metolazone at this point.  He is to call to make  an appointment to  see Dr. Kevan Ny in approximately 4-5 days.  At that  time, he will need a protime.  He is to continue nocturnal oxygen.  Increase activity slowly.  No-added-salt diet.      Theressa Millard, M.D.  Electronically Signed     JO/MEDQ  D:  03/21/2009  T:  03/22/2009  Job:  [id number]

## 2011-04-07 NOTE — Consult Note (Signed)
Jared Hall, Jared Hall                   ACCOUNT NO.:  [id number]   MEDICAL RECORD NO.:  [id number]          PATIENT TYPE:  INP   LOCATION:  5737                         FACILITY:  [facility]   PHYSICIAN:  Sandria Bales. Ezzard Standing, M.D.  DATE OF BIRTH:  [birth year]   DATE OF CONSULTATION:  02/22/2007  DATE OF DISCHARGE:                                 CONSULTATION   PRIMARY CARE PHYSICIAN:  Candyce Churn, M.D.   SURGEON:  Sandria Bales. Ezzard Standing, M.D.   REASON FOR CONSULTATION:  Partial small-bowel obstruction.   HISTORY OF PRESENT ILLNESS:  Jared Hall is a 77 year old male patient  whose primary care doctor is Dr. Jerelyn Scott.  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  He has a history of obesity, diabetes and a  longstanding umbilical hernia.  On about February 19, 2007, the patient  developed abdominal pain that he was he felt was secondary to  constipation, so he took a laxative with no results yet.  He actually  developed increased abdominal pain quite severe enough to seek help from  his physician.   He was sent to [facility] where a CT revealed a partial small-  bowel obstruction.  Jared Hall has never had abdominal surgery.  There  was noted to be a loop of small bowel within the umbilical hernia area,  but this was nonincarcerated and nonobstructive.  There was also air  noted in the colon on the CT scan.  Since admission, the patient has  been placed on n.p.o. status.  He had no nausea and vomiting, so an NG  tube was not inserted.  As of yesterday, February 21, 2007, the patient had  a large explosive BM and gas times several occurrences.  X-ray today  shows continued partial small-bowel obstruction but still air in the  colon.  There is still an area of stacked small bowel in the left upper  abdomen.  The patient states he actually feels much better than prior to  arrival and has been tolerating clear liquids so far today.   REVIEW OF SYSTEMS:  As above.  The patient has had an  umbilical hernia  for some time that is normally soft.  He states that during the worst of  his symptoms that this area was distended and hard.  He denies issues  related to chronic constipation, and he does report that his weight has  steadily crept up since he began caring for his wife at home.   SOCIAL HISTORY:  No alcohol.  No tobacco.   PAST MEDICAL HISTORY:  1. Diabetes mellitus.  2. Hypertension.  3. DJD.  4. Vitamin B12 deficiency.  5. GERD.  6. Obesity.  7. History of prostatitis.  8. Alpha-thalassemia minor.  9. Colon polyps on colonoscopy in 2003.  10.Sleep apnea.  11.Chronic Coumadin anticoagulation.   PAST SURGICAL HISTORY:  No abdominal surgeries.   ALLERGIES:  GLUCOPHAGE AND FLOMAX.   MEDICATIONS:  At home, the patient takes:  1. Amaryl.  2. Lasix.  3. Potassium.  4. Exforge.  5. Zocor.  6. Coumadin.  7. Aspirin.  8. B12 injections.   While hospitalized, the patient has been on:  1. IV Lasix.  2. Lovenox for DVT prophylaxis; INR is 2.7 from prior Coumadin dosing.  3. P.r.n. Dilaudid.  4. Sliding scale insulin.   PHYSICAL EXAMINATION:  GENERAL:  Pleasant male patient currently  reporting decreased abdominal symptoms.  VITAL SIGNS:  Temperature 97.7, BP 136/72, pulse 95 and regular,  respirations 20.  NEUROLOGICAL:  The patient is alert when x3, moving all extremities x4  without focal deficits.  HEENT:  Head normocephalic, sclera noninjected.  NECK:  Supple with no adenopathy.  CHEST:  Bilateral lung sounds clear to auscultation.  Respiratory effort  is nonlabored.  He is on room air.  CARDIAC:  S1-S2; no rubs, murmurs, thrills or gallops.  No JVD.  Pulses  regular.  ABDOMEN:  Is obese but soft, somewhat tympanitic.  Bowel sounds are  present but diminished.  He has a protuberant soft umbilical hernia with  a large abdominal wall defect estimated about 3 x 4 cm total.  The  hernia is easily reducible but does not stay reduced, and again hernia   area is nontender.  He has no abdominal scars.  EXTREMITIES:  Symmetrical in appearance without edema, cyanosis or  clubbing.   LABORATORY DATA:  White count 7400 - was 11,800 on admission, hemoglobin  __________ , platelets 264,000.  Sodium 137, potassium 4.6, CO2 29,  glucose 119, BUN 15, creatinine 1.22.  LFTs were normal at admission.  INR is therapeutic at 2.7.   DIAGNOSTICS:  CT of the abdomen and pelvis was done on the date of  admission and again shows multiple dilated loops of small bowel which  were only mildly dilated within the central lower abdomen and upper  pelvis.  Contrast was noted in the proximal colon.  There is a small  umbilical hernia containing a loop of a small bowel, but this is  nonobstructing.  There is a tiny amount of free fluid.  Repeat plain  abdominal x-rays from today show a slight improvement in the bowel gas  pattern, still has air in colon, no free air and stacking of small bowel  loops which are distended in the left upper abdomen.   IMPRESSION:  1. Partial small-bowel obstruction seen on CT scan which is resolving      clinically.  Consider GI evaluation in this patient whose has never      had abdominal surgery.  2. Diabetes mellitus.  3. Hypertension.  4. Systemic anticoagulation with therapeutic INR.  5. Longstanding umbilical hernia, currently not incarcerated.   PLAN:  Agree with conservative medical management at this point.  Continue clear liquids.  Slowly advance diet.  Agree with serial x-rays.  Would stop diet if the patient's abdomen becomes more distended, if he  develops nausea and vomiting or pain again or x-rays worsened.   Umbilical hernia is stable.  No evidence of incarceration on clinical  exam, nor on CT.  Do not feel this is the etiology of the patient's  small bowel obstruction.  At this point since the patient has not had prior abdominal surgery, unclear what initiated this partial small-bowel  obstruction at this  time.      Allison L. Rennis Harding, N.P.      Sandria Bales. Ezzard Standing, M.D.  Electronically Signed    ALE/MEDQ  D:  02/22/2007  T:  02/22/2007  Job:  [id number]   cc:   Candyce Churn, M.D.

## 2011-04-07 NOTE — Cardiovascular Report (Signed)
[place]. [facility]  Patient:    Jared Hall, Jared Hall Visit Number: [id number] MRN: [id number]          Service Type: MED Location: 3700 [id number] Attending Physician:  Lesly Dukes Dictated by:   Noralyn Pick Eden Emms, M.D. [facility] Proc. Date: 05/12/02 Admit Date:  05/08/2002   CC:         [name], M.D.  Echocardiogram Lab   Cardiac Catheterization  PROCEDURE:  Transesophageal echocardiogram.  CARDIOLOGIST:  Noralyn Pick. Eden Emms, M.D.  INDICATIONS:  Rule out source of embolus, right sided stroke.  DESCRIPTION OF PROCEDURE:  The patient was premedicated with Demerol 12.5 mg and Zofran 4 mg on the floor.  She subsequently received 6 mg of Versed prior to the procedure in endoscopy.  Using digital technique, an Omniplane probe was advanced into the distal esophagus without incidence.  Transgastric imaging revealed mild LVH particularly with some basal septal prominence but no outflow gradient.  Ejection fraction was 60%.  Mitral valve was mildly thickened with mild MR.  Aortic valve was slightly thickened and sclerotic with mild aortic insufficiency.  Imaging in the atrial and ventricular septum showed no AST.  Bubble study showed no evidence of right to left shunting. Left atrial appendage had no thrombus.  There were no atrial masses.  Imaging of the aorta showed no significant aortic debris.  The patient tolerated the procedure well.  In short, there was no significant source of embolus.  FINAL IMPRESSION: 1. No source of embolus. 2. Negative bubble study. 3. No left atrial appendage thrombus. 4. Minimal mural aortic debris. 5. Mild aortic insufficiency. 6. Mild mitral insufficiency. 7. Mild left ventricular hypertrophy with normal left ventricular    function, ejection fraction 60%. Dictated by:   Noralyn Pick Eden Emms, M.D. [facility] Attending Physician:  Lesly Dukes DD:  05/12/02 TD:  05/12/02 Job: [id number]

## 2011-04-07 NOTE — Discharge Summary (Signed)
[place]. [facility]  Patient:    Jared Hall, Jared Hall Visit Number: [id number] MRN: [id number]          Service Type: MED Location: [id number] Attending Physician:  Lesly Dukes Dictated by:   Melvyn Novas, M.D. Admit Date:  05/08/2002 Discharge Date: 05/13/2002                             Discharge Summary  DATE OF BIRTH:  January 16, 1971.  ADMITTING PHYSICIAN:  Dr. [name].  SUMMARY:  The patient is a patient on the cardiac floor.  His primary care physician is Dr. Pearla Dubonnet at [facility].  This pleasant and cooperative right-handed Caucasian gentleman is 77 years old and presented on May 08, 2002 to the emergency room with new-onset right-sided weakness.  This had been of kind of stuttering onset throughout the day.  The patient states that he slept when he first noticed the weakness and after finally leaving his bedstead, he found himself weak in his gait as well.  The patient had left arm weakness, was unable to walk; he also had sensory changes.  The patient has a history of hypertension and diabetes mellitus.  He was treated and will be discharged on Monopril, Norvasc, Amaryl, Avandia, hydrochlorothiazide and Septra for prostatitis.  FURTHER TESTS:  The patient was on cardiac monitoring and showed normal EKG, QRS complexes, probably indicative of a remote inferior MI.  The patient further underwent an echocardiogram on May 09, 2002 which showed the ejection fraction to be 60% with mild mitral valve regurgitation and no obvious source of embolism.  A carotid Doppler study performed on May 09, 2002 showed no evidence of significant ICA stenosis bilaterally.  The right ECA appeared stenotic; antegrade/retrograde flow was appreciated.  Cardiology:  The patient had a portable chest that showed minimal atelectasis. A CT of the head was performed on May 08, 2002 at 10:30 p.m. 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  An MRI of the brain was then performed on the 20th of June; this shows acute non-hemorrhagic infarct scattered throughout the left parasagittal region involving left frontal lobe, left parietal lobe and left splenium of corpus callosum.  There was also a single small infarct, posterior limb of the right internal capsule, seen, question of hypotensive episode versus embolic phenomenon.  MRI shows that the left vertebral artery appeared dominant, the external carotid arteries are patent, internal carotid artery as well.  Ectasia of the distal vertical segment of the right internal carotid artery was appreciated.  Transesophageal echocardiogram was performed on the 23rd of June; no evidence of thrombus.  Laboratory:  The patient was started on IV heparin and therefore followed in his PT and PTT daily by protocol.  He was discharged on the 24th of June 2003 on a PTT of 66, glucose 171, MCV 70, white blood cell count 6.03; these were the only abnormal lab studies.  The patient was started on Coumadin 5 mg p.o. q.d. and was discharged in stabilized condition.  DISCHARGE MEDICATIONS: 1. Coumadin 5 mg q.d. p.o. 2. Norvasc 5 mg q.d. p.o. 3. Lisinopril 20 mg p.o. q.d. 4. Hydrochlorothiazide 25 mg q.a.m. 5. Bactrim DS b.i.d. p.o. for seven more days for prostatitis. 6. Oral antidiabetics as prior to admission. 7. Aspirin 81 mg q.d.  DIET:  The patient has a diabetic 2000-kilocalorie diet restriction.  FOLLOWUP:  He is supposed to see the [facility], Dr. Johnella Moloney, tomorrow for a followup and will have his INR controlled by his primary care physician.  He also received a prescription for physical therapy at home evaluation and treatment for gait and balance as needed. Dictated by:   Melvyn Novas, M.D. Attending Physician:  Lesly Dukes DD:  05/13/02 TD:  05/14/02 Job:  [id number]

## 2011-04-07 NOTE — H&P (Signed)
[place]. [facility]  Patient:    Jared Hall, Jared Hall Visit Number: [id number] MRN: [id number]          Service Type: MED Location: 3700 [phone] Attending Physician:  Lesly Dukes Dictated by:   [name], M.D. Admit Date:  05/08/2002   CC:         Pearla Dubonnet, M.D.   History and Physical  HISTORY OF PRESENT ILLNESS:  The patient is a 77 year old left-handed white male born [birth year] with a history of diabetes and obesity who comes to the [facility] emergency room for some right-sided weakness.  This patient claims that he was watching the news on T.V., believes that he fell asleep around 7 p.m., is not sure how long he slept.  The patient noted it was dark when he awakened and when he woke up noted that his right arm was weak.  The patient was sleeping on his left side on the couch.  The patient was able to walk to the telephone and called 911.  The patient was brought to the emergency room for an evaluation.  The patient denied any numbness of the arm in the emergency room.  Nursing staff and physician staff noted weakness of both the right arm and the right leg.  No clear slurred speech or visual changes were reported.  The patient has resolved with his deficit since initial presentation.  CT scan of the head is unremarkable.  The patient is being admitted for an evaluation of an apparent TIA event.  PAST MEDICAL HISTORY: 1. History of left-brain right-body TIA as above. 2. Diabetes. 3. Hypertension. 4. Obesity. 5. Recent prostate gland infection, on antibiotics.  MEDICATIONS: 1. Bactrim DS one p.o. b.i.d. 2. Norvasc 5 mg a day. 3. Monopril 20 mg a day. 4. Amaryl 4 mg a day. 5. Avandia 4 mg a day. 6. Aspirin 81 mg Mondays, Wednesdays, and Fridays.  ALLERGIES:  No known allergies.  HABITS:  Does not smoke or drink.  SOCIAL HISTORY:  This patient lives in [place], [place], is married, has two children who  are alive and well.  One child has passed away from lymphoma.  FAMILY HISTORY:  Notable that mother has passed away from double pneumonia. Father died from an MI at age 80.  The patient has one sister who died from colon cancer.  REVIEW OF SYSTEMS:  Notable for no recent fevers or chills.  The patient denies headache, visual field changes, swallowing problems, neck pains, shortness of breath, chest pains.  No nausea or vomiting.  The patient has had some urinary frequency.  Has had some dizziness.  Had a near-syncopal episode several days ago, did not blackout completely.  The patient denies any prior history of stroke.  PHYSICAL EXAMINATION:  VITAL SIGNS:  Blood pressure 142/76, heart rate 83, respiratory rate 16.  GENERAL:  This patient is a markedly obese white male who is alert and cooperative at the time of examination, appearing somewhat anxious.  HEENT:  Head is atraumatic.  Eyes show pupils are equal, round, react to light.  Disks are flat bilaterally.  NECK:  Supple.  No carotid bruits noted.  RESPIRATORY:  Clear.  CARDIOVASCULAR:  Reveals distant heart sounds.  No obvious murmurs or rubs noted.  ABDOMEN:  Very obese, positive bowel sounds.  No organomegaly or tenderness is noted.  EXTREMITIES:  Reveal 1+ edema at the ankles bilaterally.  NEUROLOGIC:  Cranial nerves as above.  Facial symmetry  is present.  The patient notes good sensation of the face to pinprick and soft touch bilaterally.  The patient has good strength to facial muscles and the muscles to head turning and shoulder shrug bilaterally.  Speech is well enunciated, not aphasic or dysarthric.  Motor testing reveals 5/5 strength in all fours. Good symmetric motor tone is noted throughout.  Sensory testing is intact to pinprick, soft touch, vibratory sensation throughout.  The patient has good finger-nose-finger, toe-to-finger bilaterally.  The patient was not ambulated. Deep tendon reflexes were present  but depressed throughout.  Toes neutral bilaterally.  Pronator drift was not present.  LABORATORY DATA:  White count 8.4, hemoglobin 13.1, hematocrit 41.1, MCV 67.4, platelets 278.  The rest of the blood work including PT, PTT, comprehensive metabolic profile are pending.  EKG and chest x-ray are pending.  CT scan of the head is as above.  IMPRESSION: 1. Transient ischemic attack event involving the left brain/right body. 2. Obesity. 3. Hypertension. 4. Diabetes.  This patient appears to be at baseline at this point.  Will be admitting this patient for evaluation of a TIA event that occurred tonight.  PLAN: 1. Admission to [facility], monitored bed. 2. MRI of the brain. 3. MR angiogram. 4. A 2-D echocardiogram. 5. IV heparin therapy for now.  Will follow the patients clinical course while in-house. Dictated by:   [name], M.D. Attending Physician:  Lesly Dukes DD:  05/08/02 TD:  05/10/02 Job: [id number]

## 2011-04-07 NOTE — Discharge Summary (Signed)
NAMECHAUN, UEMURA                   ACCOUNT NO.:  [id number]   MEDICAL RECORD NO.:  [id number]          PATIENT TYPE:  INP   LOCATION:  5737                         FACILITY:  [facility]   PHYSICIAN:  Candyce Churn, M.D.DATE OF BIRTH:  01/22/1931   DATE OF ADMISSION:  02/20/2007  DATE OF DISCHARGE:  02/25/2007                               DISCHARGE SUMMARY   DISCHARGE DIAGNOSES:  1. Small bowel obstruction, resolved.  2. Type 2 diabetes.  3. Hypertension.  4. 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  5. B12 deficiency.  6. Chest pain syndrome July 2003 secondary to gastroesophageal reflux      disease and spasm.  Coronary artery disease work up at the time      revealed no significant coronary artery disease.  7. History of cerebrovascular accident-on anticoagulation therapy.  8. Obesity.  9. Recurrent prostatitis.  10.Cervical degenerative disk disease.  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  12.History of colonic polyps with last colonoscopy October 2003.  13.History of sleep apnea on nocturnal O2.   DISCHARGE MEDICATIONS:  1. Coumadin 2.5 mg orally daily.  2. Lasix 40 mg daily.  3. Amaryl 40 mg daily.  4. Potassium chloride 20 mg daily.  5. Zocor 20 mg daily.  6. Aspirin 81 mg daily.  7. Exforge 5/160 onedaily.  8. Lantus insulin as per prior to hospitalization.   PROCEDURE:  CT of the abdomen with contrast on February 20, 2007-small bowel  obstruction the central lower abdomen and upper pelvis.  Thought that it  may possibly be secondary to adhesion.  Small umbilical hernia  containing small bowel which is nonobstructive.   HOSPITAL COURSE:  Mr. Jakyren Fluegge very pleasant 77 year old male who  presented to [facility] on February 20, 2007 complaining  of abdominal pain and bloating for 24 hours.  X-ray revealed a partial  small bowl obstruction with no free air.  Admission was elected to  manage.   Patient was treated conservatively with IV  fluids and no NG tube  suction.  He had a CT scan on the second day of admission revealing a  partial small bowel obstruction.   Simply resting his bowel with IV fluids and keeping NPO without NG tube  suctioning was quite helpful.   Continued to resolve on it's own.  Surgical consultation was obtained on  February 22, 2007 because of persistent abdominal distention on day 2 of the  hospitalization and they recommended conservative medical management as  well.   By February 22, 2007 he was able to take clear liquids and fullliquids on  February 23, 2007 and was passing flatus on February 24, 2007.  Was discharged  on February 25, 2007 with a spontaneous resolution of the small bowel  obstruction.  He never had prior abdominal surgeries.  Most likely cause  would be an adhesion.   The patient was discharged home with above medications to be followed up  with in one week at [facility].   DISCHARGE LABORATORIES:  On February 24, 2007 white count 6600.  Hemoglobin  11.9.  Platelet count 256000.  Pro Time on February 25, 2007 was 13.9  seconds.  INR 1.1.  INR on admission was 2.7.   Patient admits that his Coumadin was stopped.  He was placed on Lovenox  while hospitalized to for the possibility for necessary surgery for the  small bowel obstruction but because of it's spontaneous resolution the  Coumadin was restarted and discharged.   Electrolytes on February 25, 2007 revealed sodium 139.  Potassium 4.1.  Chloride 105.  Bicarb 31.  Glucose 119.  BUN 14.  Creatinine 1.24.  On  February 20, 2007, amylase was67 and lipase 25.  LFTs were normal on February 20, 2007 except for a slightly low albuminat 3.3.   Patient was discharged in improved condition and will be followed up in  1 week at [facility].      Candyce Churn, M.D.  Electronically Signed     RNG/MEDQ  D:  04/11/2007  T:  04/11/2007  Job:  [id number]   cc:   Sandria Bales. Ezzard Standing, M.D.

## 2011-04-07 NOTE — Discharge Summary (Signed)
[place]. [facility]  Patient:    Jared Hall, Jared Hall Visit Number: [id number] MRN: [id number]          Service Type: MED Location: 3700 [id number] Attending Physician:  Danae Chen Admit Date:  06/05/2002 Discharge Date: 06/06/2002   CC:         Jared Hall, M.D.   Discharge Summary  DISCHARGE DIAGNOSES: 1. Chest pain, resolved; likely secondary to esophageal reflux and esophageal    spasm.  Further cardiac work-up will occur as an outpatient. 2. Recent cerebrovascular accident with no residual deficits, on Coumadin. 3. Type 2 diabetes mellitus. 4. Hypertension. 5. Hypercholesterolemia.  DISCHARGE MEDICATIONS: 1. Amaryl 2 mg b.i.d. 2. Avandia 4 mg b.i.d. 3. Coumadin therapy - usually at 5 mg daily. 4. Monopril 10 mg b.i.d. 5. Norvasc 2.5 mg daily. 6. Zocor 20 mg a day.  HOSPITAL COURSE:  The patient is a pleasant 77 year old male who presented with left arm pain radiating to his chest, upper jaw, with an episode lasting less than five minutes each. These pains were associated with belching.  No nausea, vomiting, or diaphoresis.  Nitroglycerin helped relieve the pain in the emergency room.  When the patient presented to the [facility] ER on June 05, 2002, he had two more episodes of sharp pain in the emergency room and sublingual nitroglycerin helped relieve the pain twice.  The patient was admitted to the hospital and started on heparin and nitroglycerin. Serial CPKs were within normal limits.  Troponins were less than 0.02.  The patient was started on Protonix on the day of admission with resolution of symptoms the patient presented with.  The patient was discharged home on the second day of admission pain-free and feeling well. He ambulated without any chest, throat or arm pain.  No shortness of breath.  PLAN:  Plans will be to schedule an outpatient stress Cardiolite just to make sure that he has no coronary  ischemia and to continue Protonix 40 mg a day and to follow GERD precautions.  FOLLOW-UP:  Will plan to see him back in the office within one week.  OTHER LABORATORIES ON ADMISSION:  EKG revealing normal sinus rhythm with first degree AV block, rule out ST and T-wave changes.  Chest x-ray revealed cardiomegaly with no active disease.  INR on admission was 3.8 and Coumadin was held.  It was resumed as an outpatient. White count 6300, hemoglobin 12.3, platelets 259,000.  Sodium 135, potassium 3.9, chloride 104, bicarb 28, BUN 20, creatinine 1.2, calcium 9.0, blood sugar 243. CPK on admission was 137, CK-MB 1.8.  Total protein 6.3, albumin 3.6.  LFTs were normal. Attending Physician:  Danae Chen DD:  06/09/02 TD:  06/13/02 Job: [id number]

## 2011-04-07 NOTE — H&P (Signed)
[place]. [facility]  Patient:    Jared Hall, Jared Hall Visit Number: [id number] MRN: [id number]          Service Type: MED Location: 3700 [id number] Attending Physician:  Danae Chen Dictated by:   [name], M.D. Admit Date:  06/05/2002 Discharge Date: 06/06/2002   CC:         Pearla Dubonnet, M.D.   History and Physical  DATE OF BIRTH:  [birth year]  PRIMARY CARE PHYSICIAN:  Dr. [facility] [place].  PROBLEM LIST: 1. Chest pain with left arm pain.    a. With dyspnea.    b. Relieved with nitroglycerin. 2. Recent cerebrovascular accident, no residual deficits.    a. On Coumadin. 3. Diabetes mellitus. 4. Hypertension. 5. Hypercholesterolemia.  HISTORY OF PRESENT ILLNESS:  This is a 77 year old diabetic gentleman with a history of high blood pressure and cholesterol, who recently had a stroke about three weeks ago.  His primary physicians placed him on Coumadin and he had been doing well until this past two days, when he started experiencing some left arm numbness and tingling.  Over the last 24 hours, the numbness and tingling progressed to actual discomfort which radiated to his chest and he had several episodes of shoulder pain that were associated with dyspnea.  He never experienced any nausea or vomiting or diaphoresis.  Each episode lasted about five minutes, was not associated with exertional activity but was relieved with rest.  On admission to the emergency department, he had similar episodes which were relieved with nitroglycerin.  On his initial exam, he was pain-free, but he did have two more episodes of chest discomfort that were relieved with sublingual nitroglycerin.  PAST MEDICAL HISTORY:  Per his problem list.  SOCIAL HISTORY:  Per his problem list.  FAMILY HISTORY:  No history of coronary artery disease.  SOCIAL HISTORY:  He is not a tobacco user, occasional alcohol, is  married.  MEDICATIONS: 1. Amaryl 2 mg p.o. b.i.d. 2. Avandia 4 mg p.o. b.i.d. 3. Coumadin 5 mg p.o. q.d. 4. Monopril 10 mg p.o. b.i.d. 5. Norvasc 2.5 mg p.o. q.d. 6. Zocor 20 mg p.o. q.d.  ALLERGIES:  He has no allergies.  PHYSICAL EXAMINATION:  VITAL SIGNS:  Temperature 96.6, blood pressure 202/90, pulse 109, respiratory rate 22.  O2 saturation 97% on room air.  GENERAL:  He is in no acute distress, speaking in full complete sentences.  HEENT:  Pupils are equal and reactive.  Oropharynx is clear.  NECK:  Supple.  There are no carotid bruits appreciated.  CARDIOVASCULAR:  He was tachycardic but with regular rhythm and no murmurs. He had 2+ peripheral pulses.  ABDOMEN:  Soft, slightly obese but nontender and no rebound and no guarding.  LUNGS:  Clear to auscultation bilaterally.  EXTREMITIES:  No evidence of edema.  RECTAL:  He is guaiac-negative.  NEUROLOGIC:  Exam was nonfocal.  Strength was 5/5.  Deep tendon reflexes were intact. His cranial nerves II-XII were grossly intact.  LABORATORY AND ACCESSORY DATA:  Lab values:  White count was 6300, hemoglobin was 12.3 mg/dl, platelets 578,469.  Sodium 135, potassium 2.9, chloride 104, CO2 228, BUN 20, creatinine 1.2, calcium 9, glucose 243,000.  CK:  Initial set was 137/1.8.  INR was 3.8.  Troponin was 0.02.  EKG showed normal sinus rhythm with first-degree A-V block.  There were no ST or T wave abnormalities, no ST segment depression or elevation.  Chest x-ray showed  some cardiac enlargement but there was no active disease noted.  ASSESSMENT AND PLAN: 1. A [age]-year-old diabetic with atypical chest pain, but with a history    of hypertension and high cholesterol.  He is suspicious for silent    myocardial infarction or an atypical presentation of myocardial infarction.    a. Admit to telemetry bed.    b. Rule out myocardial infarction with serial cardiac enzymes.    c. Adequate pain control.    d. Adequate  sugar monitoring and control.    e. Gastrointestinal prophylaxis.    f. Cardiology consult.  The patient will most likely need an adenosine or       stress Cardiolite.  He is admitted to be 3741. Dictated by:   [name], M.D. Attending Physician:  Danae Chen DD:  06/05/02 TD:  06/10/02 Job: [id number]

## 2011-04-07 NOTE — H&P (Signed)
Jared Hall, Jared Hall                   ACCOUNT NO.:  [id number]   MEDICAL RECORD NO.:  [id number]          PATIENT TYPE:  INP   LOCATION:  2028                         FACILITY:  [facility]   PHYSICIAN:  Candyce Churn, M.D.DATE OF BIRTH:  1931/03/23   DATE OF ADMISSION:  02/20/2007  DATE OF DISCHARGE:                              HISTORY & PHYSICAL   DICTATED BY:  Lavinia Sharps, FNP   HISTORY OF PRESENT ILLNESS AND CHIEF COMPLAINT:  He has had abdominal  pain and bloating for 24 hours.  He only realized he had not had a bowel  movement and took a laxative.  He has noted ever since his abdomen has  become more painful and more bloated.   He does complain of mild nausea, but no vomiting.  He does not remember  when he had his last bowel movement.  He has no history of small bowel  obstruction or abdominal surgeries.   He does have a history of diabetes.   His abdominal x-rays showed a partial bowel obstruction with no free  air.  We decided to admit him.   DRUG ALLERGIES:  GLUCOPHAGE AND FLOMAX.   MEDICATIONS:  1. Amaryl 4 mg 1 every day.  2. Lasix 80 mg 1 every day.  3. K-Dur 20 mEq 1 b.i.d.  4. Exforge 5/160 1 every day.  5. Zocor 20 mg 1 q.h.s.  6. Coumadin 2.5 mg every day.  7. Aspirin 81 mg every day.  8. B12 injections every month.   PAST MEDICAL HISTORY:  1. Diabetes.  2. Hypertension.  3. DJD of the knees.  4. B12 deficiency.  5. Chest pain syndrome July 2003, secondary to GERD and spasm.  No CAD      with cardiac evaluation in August of 2003.  Now he is on the      anticoagulation therapy because of a stroke.  6. Obesity.  7. Prostatitis, recurring.  8. Cervical DJD.  9. 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  10.He has a history of colonic polyps, with a last colonoscopy in      October of 2003.  11.History of sleep apnea, on nocturnal O2.   FAMILY HISTORY:  Father died at age 41 of an MI.  Sister died of rectal  cancer about 12 years ago.  A son died of an  adenocarcinoma about 7  years ago.   SOCIAL HISTORY:  No smoking, no alcohol intake.  He is retired from [employer].   REVIEW OF SYSTEMS:  Negative other than chief complaint.   PHYSICAL EXAM:  GENERAL:  Obese male in acute distress.  VITAL SIGNS:  Weight not done.  Temperature 98.1, pulse 80, blood  pressure 140/70.  HEENT:  Eyes:  PERRL.  Ears:  Clear.  Nose and throat:  Unremarkable.  NECK:  Supple.  No thyroid enlargement.  No tenderness.  No  lymphadenopathy.  No carotid bruit.  CHEST:  Clear.  ABDOMEN:  Distended and firm to touch, with no bowel sounds present.  He  is tender throughout.  GENITALIA:  Testicles are normal and descended.  He has no masses.  RECTAL EXAM:  Reveals no stool to guaiac and prostate is enlarged, but  nontender.  EXTREMITIES:  No edema.  NEUROLOGIC EXAM:  Oriented x3.  SKIN:  Clear.   LABORATORY DATA:  A flat and upright of the abdomen reveals a small  bowel obstruction with no free air.   IMPRESSION:  Small bowel obstruction.   PLAN:  To admit for further evaluation and treatment.      Candyce Churn, M.D.  Electronically Signed     RNG/MEDQ  D:  02/20/2007  T:  02/21/2007  Job:  [id number]

## 2011-04-07 NOTE — Op Note (Signed)
   NAME:  Jared Hall, Jared Hall                             ACCOUNT NO.:  [id number]   MEDICAL RECORD NO.:  [id number]                   PATIENT TYPE:  AMB   LOCATION:  ENDO                                 [facility]ILITY[facility]  [facility]   PHYSICIAN:  Danise Edge, M.D.                DATE OF BIRTH:  12-14-1930   DATE OF PROCEDURE:  08/25/2002  DATE OF DISCHARGE:                                 OPERATIVE REPORT   PROCEDURE:  Surveillance colonoscopy.   INDICATIONS FOR PROCEDURE:  Mr. Jared Hall is a 77 year old male born  23-Mar-1931. Mr. Jared Hall has undergone colonoscopic exams in the past to  remove neoplastic but noncancerous colon polyps. He is scheduled to undergo  a surveillance colonoscopy with polypectomy to prevent colon cancer.   ENDOSCOPIST:  Charolett Bumpers, M.D.   PREMEDICATION:  Versed 5 mg, Demerol 50 mg .   ENDOSCOPE:  Olympus pediatric colonoscope.   DESCRIPTION OF PROCEDURE:  After obtaining informed consent, Mr. Jared Hall was  placed in the left lateral decubitus position. I administered intravenous  Demerol and intravenous Versed to achieve conscious sedation for the  procedure. The patient's blood pressure, oxygen saturation and cardiac  rhythm were monitored throughout the procedure and documented in the medical  record.   Anal inspection was normal. Digital rectal exam revealed an enlarged but non-  nodular prostate. The Olympus pediatric video colonoscope was introduced  into the rectum and advanced to the cecum. Colonic preparation for the exam  today was excellent.   RECTUM:  Normal.   SIGMOID COLON AND DESCENDING COLON:  Normal.   SPLENIC FLEXURE:  Normal.   TRANSVERSE COLON:  Normal.   HEPATIC FLEXURE:  Normal.   ASCENDING COLON:  Normal.   CECUM AND ILEOCECAL VALVE:  Normal.    ASSESSMENT:  Normal proctocolonoscopy to the cecum. No endoscopic evidence  for the presence of colorectal neoplasia.   RECOMMENDATIONS:  Repeat colonoscopy in five  years.                                                 Danise Edge, M.D.    MJ/MEDQ  D:  08/25/2002  T:  08/25/2002  Job:  [id number]   cc:   Candyce Churn, MD  [address] [place]  [place] [address]  Fax: [phone]

## 2011-08-18 LAB — DIFFERENTIAL
Basophils Absolute: 0
Lymphocytes Relative: 11 — ABNORMAL LOW
Lymphs Abs: 1
Neutro Abs: 6.9
Neutrophils Relative %: 75

## 2011-08-18 LAB — POCT CARDIAC MARKERS
CKMB, poc: 1.9
Myoglobin, poc: 136
Operator id: 272551

## 2011-08-18 LAB — COMPREHENSIVE METABOLIC PANEL
ALT: 30
AST: 60 — ABNORMAL HIGH
Alkaline Phosphatase: 47
CO2: 33 — ABNORMAL HIGH
Calcium: 9.1
GFR calc Af Amer: 58 — ABNORMAL LOW
Potassium: 4.8
Sodium: 140
Total Protein: 5.8 — ABNORMAL LOW

## 2011-08-18 LAB — CARDIAC PANEL(CRET KIN+CKTOT+MB+TROPI)
CK, MB: 2
Relative Index: 0.6
Relative Index: 0.8
Total CK: 297 — ABNORMAL HIGH
Total CK: 340 — ABNORMAL HIGH
Troponin I: 0.01
Troponin I: 0.01

## 2011-08-18 LAB — POCT I-STAT, CHEM 8
Calcium, Ion: 1.2
Creatinine, Ser: 1.7 — ABNORMAL HIGH
Glucose, Bld: 151 — ABNORMAL HIGH
Hemoglobin: 14.3
Potassium: 3.6

## 2011-08-18 LAB — CBC
MCHC: 31.9
Platelets: 255
RBC: 5.52
RDW: 15.3
RDW: 15.5
WBC: 9.1

## 2011-08-18 LAB — URINALYSIS, ROUTINE W REFLEX MICROSCOPIC
Glucose, UA: NEGATIVE
Ketones, ur: NEGATIVE
Nitrite: NEGATIVE
Protein, ur: NEGATIVE

## 2011-08-18 LAB — PROTIME-INR
INR: 2 — ABNORMAL HIGH
Prothrombin Time: 24 — ABNORMAL HIGH

## 2011-08-18 LAB — CK TOTAL AND CKMB (NOT AT ARMC): CK, MB: 1.4

## 2011-12-12 ENCOUNTER — Other Ambulatory Visit: Payer: Self-pay | Admitting: Internal Medicine

## 2011-12-12 DIAGNOSIS — K469 Unspecified abdominal hernia without obstruction or gangrene: Secondary | ICD-10-CM | POA: Diagnosis not present

## 2011-12-13 ENCOUNTER — Other Ambulatory Visit: Payer: Self-pay

## 2011-12-13 ENCOUNTER — Inpatient Hospital Stay (HOSPITAL_COMMUNITY)
Admission: EM | Admit: 2011-12-13 | Discharge: 2011-12-22 | DRG: 354 | Disposition: A | Discharge status: Home Health | Payer: Medicare Other | Attending: General Surgery | Admitting: General Surgery

## 2011-12-13 ENCOUNTER — Ambulatory Visit
Admission: RE | Admit: 2011-12-13 | Discharge: 2011-12-13 | Disposition: A | Discharge status: Home / Self Care | Payer: Medicare Other | Source: Ambulatory Visit | Attending: Internal Medicine | Admitting: Internal Medicine

## 2011-12-13 ENCOUNTER — Encounter (HOSPITAL_COMMUNITY): Payer: Self-pay | Admitting: *Deleted

## 2011-12-13 DIAGNOSIS — I471 Supraventricular tachycardia, unspecified: Secondary | ICD-10-CM | POA: Diagnosis present

## 2011-12-13 DIAGNOSIS — K420 Umbilical hernia with obstruction, without gangrene: Principal | ICD-10-CM | POA: Diagnosis present

## 2011-12-13 DIAGNOSIS — Z79899 Other long term (current) drug therapy: Secondary | ICD-10-CM

## 2011-12-13 DIAGNOSIS — E119 Type 2 diabetes mellitus without complications: Secondary | ICD-10-CM | POA: Diagnosis not present

## 2011-12-13 DIAGNOSIS — Z7982 Long term (current) use of aspirin: Secondary | ICD-10-CM

## 2011-12-13 DIAGNOSIS — N400 Enlarged prostate without lower urinary tract symptoms: Secondary | ICD-10-CM | POA: Diagnosis present

## 2011-12-13 DIAGNOSIS — N189 Chronic kidney disease, unspecified: Secondary | ICD-10-CM | POA: Diagnosis present

## 2011-12-13 DIAGNOSIS — Z8546 Personal history of malignant neoplasm of prostate: Secondary | ICD-10-CM | POA: Diagnosis not present

## 2011-12-13 DIAGNOSIS — I10 Essential (primary) hypertension: Secondary | ICD-10-CM | POA: Diagnosis not present

## 2011-12-13 DIAGNOSIS — R269 Unspecified abnormalities of gait and mobility: Secondary | ICD-10-CM | POA: Diagnosis present

## 2011-12-13 DIAGNOSIS — R109 Unspecified abdominal pain: Secondary | ICD-10-CM | POA: Diagnosis not present

## 2011-12-13 DIAGNOSIS — E860 Dehydration: Secondary | ICD-10-CM | POA: Diagnosis not present

## 2011-12-13 DIAGNOSIS — I4891 Unspecified atrial fibrillation: Secondary | ICD-10-CM | POA: Diagnosis not present

## 2011-12-13 DIAGNOSIS — K429 Umbilical hernia without obstruction or gangrene: Secondary | ICD-10-CM | POA: Diagnosis not present

## 2011-12-13 DIAGNOSIS — Z01810 Encounter for preprocedural cardiovascular examination: Secondary | ICD-10-CM

## 2011-12-13 DIAGNOSIS — F329 Major depressive disorder, single episode, unspecified: Secondary | ICD-10-CM | POA: Diagnosis present

## 2011-12-13 DIAGNOSIS — E785 Hyperlipidemia, unspecified: Secondary | ICD-10-CM | POA: Diagnosis present

## 2011-12-13 DIAGNOSIS — R5381 Other malaise: Secondary | ICD-10-CM | POA: Diagnosis present

## 2011-12-13 DIAGNOSIS — K430 Incisional hernia with obstruction, without gangrene: Secondary | ICD-10-CM

## 2011-12-13 DIAGNOSIS — K56609 Unspecified intestinal obstruction, unspecified as to partial versus complete obstruction: Secondary | ICD-10-CM | POA: Diagnosis not present

## 2011-12-13 DIAGNOSIS — Z6835 Body mass index (BMI) 35.0-35.9, adult: Secondary | ICD-10-CM

## 2011-12-13 DIAGNOSIS — Z01812 Encounter for preprocedural laboratory examination: Secondary | ICD-10-CM

## 2011-12-13 DIAGNOSIS — D72829 Elevated white blood cell count, unspecified: Secondary | ICD-10-CM | POA: Diagnosis present

## 2011-12-13 DIAGNOSIS — F3289 Other specified depressive episodes: Secondary | ICD-10-CM | POA: Diagnosis present

## 2011-12-13 DIAGNOSIS — Z8679 Personal history of other diseases of the circulatory system: Secondary | ICD-10-CM

## 2011-12-13 DIAGNOSIS — E669 Obesity, unspecified: Secondary | ICD-10-CM | POA: Diagnosis present

## 2011-12-13 DIAGNOSIS — Z8673 Personal history of transient ischemic attack (TIA), and cerebral infarction without residual deficits: Secondary | ICD-10-CM

## 2011-12-13 DIAGNOSIS — E1169 Type 2 diabetes mellitus with other specified complication: Secondary | ICD-10-CM | POA: Diagnosis present

## 2011-12-13 DIAGNOSIS — Z794 Long term (current) use of insulin: Secondary | ICD-10-CM

## 2011-12-13 DIAGNOSIS — N289 Disorder of kidney and ureter, unspecified: Secondary | ICD-10-CM | POA: Diagnosis not present

## 2011-12-13 DIAGNOSIS — K5669 Other intestinal obstruction: Secondary | ICD-10-CM | POA: Diagnosis present

## 2011-12-13 DIAGNOSIS — Z7902 Long term (current) use of antithrombotics/antiplatelets: Secondary | ICD-10-CM

## 2011-12-13 HISTORY — DX: Umbilical hernia without obstruction or gangrene: K42.9

## 2011-12-13 LAB — CBC
Hemoglobin: 12.7 g/dL — ABNORMAL LOW (ref 13.0–17.0)
MCH: 21.9 pg — ABNORMAL LOW (ref 26.0–34.0)
MCHC: 32.1 g/dL (ref 30.0–36.0)
MCV: 68.2 fL — ABNORMAL LOW (ref 78.0–100.0)
RBC: 5.81 MIL/uL (ref 4.22–5.81)

## 2011-12-13 LAB — DIFFERENTIAL
Basophils Relative: 0 % (ref 0–1)
Eosinophils Relative: 2 % (ref 0–5)
Lymphs Abs: 1.1 10*3/uL (ref 0.7–4.0)
Monocytes Absolute: 1.7 10*3/uL — ABNORMAL HIGH (ref 0.1–1.0)
Neutro Abs: 10.7 10*3/uL — ABNORMAL HIGH (ref 1.7–7.7)

## 2011-12-13 LAB — BASIC METABOLIC PANEL
BUN: 25 mg/dL — ABNORMAL HIGH (ref 6–23)
CO2: 26 mEq/L (ref 19–32)
Chloride: 102 mEq/L (ref 96–112)
Creatinine, Ser: 1.87 mg/dL — ABNORMAL HIGH (ref 0.50–1.35)
Glucose, Bld: 101 mg/dL — ABNORMAL HIGH (ref 70–99)

## 2011-12-13 LAB — LACTIC ACID, PLASMA: Lactic Acid, Venous: 2 mmol/L (ref 0.5–2.2)

## 2011-12-13 MED ORDER — HYDROMORPHONE HCL PF 1 MG/ML IJ SOLN
0.5000 mg | Freq: Once | INTRAMUSCULAR | Status: AC
  Administered 2011-12-13: 0.5 mg via INTRAVENOUS
  Filled 2011-12-13: qty 1

## 2011-12-13 NOTE — ED Notes (Signed)
Report received from [facility]

## 2011-12-13 NOTE — Consult Note (Addendum)
PCP: Dr. Candyce Churn Consulting physician: Dr. Bebe Shaggy   DOA:  12/13/2011  5:16 PM Reason for consultation Management of medical problems  Chief Complaint:  Abdominal pain  HPI: Patient is a poor historian Jared Hall is a 78 years old Caucasian man presented to the ER with abdominal pain for several days which progressively worsening associated with increasing bulge in his abdomen, and nausea but he denies any vomiting. He had a bowel movement at around 7 PM today. Which he described as regular nonbloody. He denies any chest pain, shortness of breath, cough, dysuria, flank pain, fever or chills . Patient had a CT abdomen today that showed small bowel obstruction caused by an umbilical hernia. Surgical service was consulted and the hernia was reduced manually. Patient was admitted under surgical service and we were consulted for management of medical problems.  Allergies: No Known Allergies  Prior to Admission medications   Medication Sig Start Date End Date Taking? Authorizing Provider  citalopram (CELEXA) 40 MG tablet Take 20 mg by mouth daily.   Yes Historical Provider, MD  clopidogrel (PLAVIX) 75 MG tablet Take 75 mg by mouth daily.   Yes Historical Provider, MD  finasteride (PROSCAR) 5 MG tablet Take 5 mg by mouth daily.   Yes Historical Provider, MD  HYDROcodone-acetaminophen (VICODIN) 5-500 MG per tablet Take 1 tablet by mouth 3 (three) times daily.   Yes Historical Provider, MD  insulin glargine (LANTUS) 100 UNIT/ML injection Inject 40 Units into the skin 2 (two) times daily.   Yes Historical Provider, MD  pantoprazole (PROTONIX) 40 MG tablet Take 40 mg by mouth daily.   Yes Historical Provider, MD  pregabalin (LYRICA) 50 MG capsule Take 50 mg by mouth 2 (two) times daily.   Yes Historical Provider, MD  simvastatin (ZOCOR) 20 MG tablet Take 20 mg by mouth every evening.   Yes Historical Provider, MD  solifenacin (VESICARE) 5 MG tablet Take 5 mg by mouth daily.    Yes  Historical Provider, MD  valsartan (DIOVAN) 320 MG tablet Take 160 mg by mouth daily.   Yes Historical Provider, MD  vitamin B-12 (CYANOCOBALAMIN) 1000 MCG tablet Take 1,000 mcg by mouth daily.   Yes Historical Provider, MD  warfarin (COUMADIN) 5 MG tablet Take 2.5-5 mg by mouth daily. Takes 0.5 tablet (2.5 MG) every day except on Friday; on Fridays, take 1 tablet (5 MG)   Yes Historical Provider, MD  zolpidem (AMBIEN) 10 MG tablet Take 10 mg by mouth at bedtime as needed. For insomnia   Yes Historical Provider, MD    Past Medical History  Diagnosis Date  .  paroxysmal atrial fibrillation History of stroke Benign prostatic hypertrophy Hypertension Umbilical hernia   . Diabetes mellitus   .  peripheral neuropathy     Social History:  He denies smoking , drinking alcohol or use illicit drugs.  No family history on file.  Review of Systems:  Constitutional: Denies fever, chills, diaphoresis, appetite change and fatigue. [name] Kitchen   Respiratory: Denies SOB, DOE, cough, chest tightness,  and wheezing.   Cardiovascular: Denies chest pain, palpitations and leg swelling.  Gastrointestinal: c/o  nausea, , abdominal pain, denies vomiting, diarrhea, constipation, blood in stool Genitourinary: Denies dysuria, urgency, frequency, hematuria, flank pain . Complaining of difficulty urinating sometimes.  Neurological: Denies dizziness, seizures, syncope, weakness, light-headedness, numbness and headaches.    Physical Exam:  Filed Vitals:   12/13/11 1717 12/13/11 1907 12/13/11 2100  BP: 104/55 120/59 120/59  Pulse: 80 80  Temp: 98.1 F (36.7 C)    TempSrc: Oral    Resp: 16 16 18   SpO2: 94% 96% 96%    Constitutional: Vital signs reviewed.  Patient is a  in mild  acute distress . Alert and oriented x 2.  Eyes: PERRL, EOMI, conjunctivae normal, No scleral icterus.  Neck: Supple, Trachea midline normal ROM, No JVD, .  Cardiovascular: Irregular irregular rate and rhythm , S1 normal, S2 normal, no  MRG, pulses symmetric and intact bilaterally Pulmonary/Chest: CTAB, no wheezes, rales, or rhonchi Abdominal: Distended, umbilical hernia noted with tenderness, no erythema, partially reducible. Bowel sounds diminished. Ext: no edema and no cyanosis, pulses palpable bilaterally  Neurological: Alert, no focal deficits appreciated.    Labs on Admission:  Results for orders placed during the hospital encounter of 12/13/11 (from the past 48 hour(s))  CBC     Status: Abnormal   Collection Time   12/13/11  6:20 PM      Component Value Range Comment   WBC 13.8 (*) 4.0 - 10.5 (K/uL)    RBC 5.81  4.22 - 5.81 (MIL/uL)    Hemoglobin 12.7 (*) 13.0 - 17.0 (g/dL)    HCT 16.1  09.6 - 04.5 (%)    MCV 68.2 (*) 78.0 - 100.0 (fL)    MCH 21.9 (*) 26.0 - 34.0 (pg)    MCHC 32.1  30.0 - 36.0 (g/dL)    RDW 40.9  81.1 - 91.4 (%)    Platelets 200  150 - 400 (K/uL)   DIFFERENTIAL     Status: Abnormal   Collection Time   12/13/11  6:20 PM      Component Value Range Comment   Neutrophils Relative 78 (*) 43 - 77 (%)    Lymphocytes Relative 8 (*) 12 - 46 (%)    Monocytes Relative 12  3 - 12 (%)    Eosinophils Relative 2  0 - 5 (%)    Basophils Relative 0  0 - 1 (%)    Neutro Abs 10.7 (*) 1.7 - 7.7 (K/uL)    Lymphs Abs 1.1  0.7 - 4.0 (K/uL)    Monocytes Absolute 1.7 (*) 0.1 - 1.0 (K/uL)    Eosinophils Absolute 0.3  0.0 - 0.7 (K/uL)    Basophils Absolute 0.0  0.0 - 0.1 (K/uL)    RBC Morphology POLYCHROMASIA PRESENT   ELLIPTOCYTES   Smear Review LARGE PLATELETS PRESENT     BASIC METABOLIC PANEL     Status: Abnormal   Collection Time   12/13/11  6:20 PM      Component Value Range Comment   Sodium 140  135 - 145 (mEq/L)    Potassium 3.4 (*) 3.5 - 5.1 (mEq/L)    Chloride 102  96 - 112 (mEq/L)    CO2 26  19 - 32 (mEq/L)    Glucose, Bld 101 (*) 70 - 99 (mg/dL)    BUN 25 (*) 6 - 23 (mg/dL)    Creatinine, Ser 7.82 (*) 0.50 - 1.35 (mg/dL)    Calcium 9.7  8.4 - 10.5 (mg/dL)    GFR calc non Af Amer 33 (*) >90  (mL/min)    GFR calc Af Amer 38 (*) >90 (mL/min)   LACTIC ACID, PLASMA     Status: Normal   Collection Time   12/13/11  6:27 PM      Component Value Range Comment   Lactic Acid, Venous 2.0  0.5 - 2.2 (mmol/L)   PROTIME-INR     Status: Normal  Collection Time   12/13/11  6:37 PM      Component Value Range Comment   Prothrombin Time 13.9  11.6 - 15.2 (seconds)    INR 1.05  0.00 - 1.49      Radiological Exams on Admission: Ct Abdomen Pelvis Wo Contrast  12/13/2011  *RADIOLOGY REPORT*  Clinical Data: Severe abdominal pain for several days, history of prostate carcinoma  CT ABDOMEN AND PELVIS WITHOUT CONTRAST  Technique:  Multidetector CT imaging of the abdomen and pelvis was performed following the standard protocol without intravenous contrast.  Comparison: CT abdomen pelvis of 02/20/2007  Findings: The lung bases are clear.  The liver is unremarkable in the unenhanced state.  No calcified gallstones are seen.  The pancreas is fatty infiltrated and the pancreatic duct is not dilated.  The adrenal glands and spleen are unremarkable.  The stomach is moderately distended with contrast and food debris and is unremarkable.  A cyst is stable emanating from the lateral right kidney.  No hydronephrosis is seen and no renal calculi are noted. The abdominal aorta is normal in caliber with atheromatous change present.  No adenopathy is seen.  An umbilical hernia has enlarged somewhat containing only a slightly dilated loop of small bowel.  No mucosal edema is seen. However, the more proximal small bowel is dilated measuring up to 4 cms in diameter consistent with partial small bowel obstruction with the umbilical hernia causing the obstruction.  No fluid is seen within the pelvis.  The urinary bladder is not distended.  The prostate is normal in size with a few small prostate clips.  Degenerative disc disease is noted diffusely throughout the lumbar spine.  IMPRESSION:    Small partial small bowel obstruction  caused by an umbilical hernia.  No mucosal edema is seen.  I discussed the findings of the study with Dr. Kevan Ny at 4:25 p.m. on 12/13/2011  Original Report Authenticated By: Juline Patch, M.D.   Assessment/Plan Small bowel obstruction secondary to an umbilical hernia Status post manual reduction, further management as per surgical service. Diabetes mellitus type 2 Recommend to hold Lantus insulin, place on insulin sliding scale and monitor CBGs. Chronic Paroxysmal atrial fibrillation EKG requested, rate controlled, patient was previously on Coumadin however as per ER conversation with daughter in law he was taken off Coumadin couple of months ago probably because of falls. Hypertension Currently well controlled on Diovan however we  recommend to switch to Norvasc because of a AK I.. Acute kidney injury Possibly secondary to dehydration, recommend to hold Diovan and gently hydrate. CT abdomen showed no hydronephrosis or bladder obstruction. Hypokalemia Recommend to replete and check magnesium level Leukocytosis Patient is afebrile, possibly secondary to stress de margination. Continue to observe and consider workup if he spikes fever. History of BPH Continue Proscar History of stroke On Plavix, that will probably need to be held if patient is going for surgery at some point. At this point he is being treated conservatively. Follow up Patient will be followed in consultation by Dr.Neville Kevan Ny and his group.VM was left . Time Spent : 45 minutes  Jared Hall 12/13/2011, 9:35 PM

## 2011-12-13 NOTE — ED Notes (Signed)
Pt had a CT at [facility] imaging which showed a umbilical hernia.  Pt was told to come here to be evaluated.  Pt had CT due to 2-3 days of abdominal pain.  No n/v or diarrhea.  LBM yesterday.

## 2011-12-13 NOTE — ED Notes (Signed)
Pt undressed and sitting comfortably in the bed.  Call bell in reach.

## 2011-12-13 NOTE — ED Provider Notes (Addendum)
History     CSN: [id number]  Arrival date & time 12/13/11  1643   First MD Initiated Contact with Patient 12/13/11 1737      Pt seen with medical student, I performed history/physical/documentation   Chief Complaint  Patient presents with  . Abdominal Pain    Patient is a 78 y.o. male presenting with abdominal pain. The history is provided by the patient and a relative.  Abdominal Pain The primary symptoms of the illness include abdominal pain and nausea. The primary symptoms of the illness do not include fever, vomiting or diarrhea. Episode onset: a brief time ago. The onset of the illness was gradual. The problem has been gradually worsening.  Associated symptoms comments: Nausea .   pt denies cp/sob/constipation No new weakness in his legs Reports long h/o umbilical hernia, has had increased pain and increased bulge in his abdomen over past several days  Past Medical History  Diagnosis Date  . Umbilical hernia   . Diabetes mellitus   . Cancer     History reviewed. No pertinent past surgical history.  No family history on file.  History  Substance Use Topics  . Smoking status: Not on file  . Smokeless tobacco: Not on file  . Alcohol Use: No      Review of Systems  Constitutional: Negative for fever.  Gastrointestinal: Positive for nausea and abdominal pain. Negative for vomiting and diarrhea.  All other systems reviewed and are negative.    Allergies  Review of patient's allergies indicates no known allergies.  Home Medications   Current Outpatient Rx  Name Route Sig Dispense Refill  . CITALOPRAM HYDROBROMIDE 40 MG PO TABS Oral Take 20 mg by mouth daily.    [name] Kitchen CLOPIDOGREL BISULFATE 75 MG PO TABS Oral Take 75 mg by mouth daily.    [name] Kitchen FINASTERIDE 5 MG PO TABS Oral Take 5 mg by mouth daily.    [name] Kitchen HYDROCODONE-ACETAMINOPHEN 5-500 MG PO TABS Oral Take 1 tablet by mouth 3 (three) times daily.    . INSULIN GLARGINE 100 UNIT/ML [place] SOLN Subcutaneous Inject 40 Units  into the skin 2 (two) times daily.    [name] Kitchen PANTOPRAZOLE SODIUM 40 MG PO TBEC Oral Take 40 mg by mouth daily.    [name] Kitchen PREGABALIN 50 MG PO CAPS Oral Take 50 mg by mouth 2 (two) times daily.    [name] Kitchen SIMVASTATIN 20 MG PO TABS Oral Take 20 mg by mouth every evening.    [name] Kitchen SOLIFENACIN SUCCINATE 5 MG PO TABS Oral Take 5 mg by mouth daily.     [name] Kitchen VALSARTAN 320 MG PO TABS Oral Take 160 mg by mouth daily.    [name] Kitchen VITAMIN B-12 1000 MCG PO TABS Oral Take 1,000 mcg by mouth daily.    . WARFARIN SODIUM 5 MG PO TABS Oral Take 2.5-5 mg by mouth daily. Takes 0.5 tablet (2.5 MG) every day except on Friday; on Fridays, take 1 tablet (5 MG)    . ZOLPIDEM TARTRATE 10 MG PO TABS Oral Take 10 mg by mouth at bedtime as needed. For insomnia      BP 104/55  Pulse 80  Temp(Src) 98.1 F (36.7 C) (Oral)  Resp 16  SpO2 94%  Physical Exam CONSTITUTIONAL: Well developed/well nourished HEAD AND FACE: Normocephalic/atraumatic EYES: EOMI/PERRL ENMT: Mucous membranes moist NECK: supple no meningeal signs SPINE:entire spine nontender CV: S1/S2 noted, no murmurs/rubs/gallops noted LUNGS: Lungs are clear to auscultation bilaterally, no apparent distress ABDOMEN: soft, umbilical hernia noted with significant tenderness, no  significant overlying erythema No other focal abd tenderness GU:no cva tenderness NEURO: Pt is awake/alert, moves all extremitiesx4 EXTREMITIES: full ROM SKIN: warm, color normal PSYCH: no abnormalities of mood noted  ED Course  Procedures  Labs Reviewed  CBC  DIFFERENTIAL  BASIC METABOLIC PANEL  LACTIC ACID, PLASMA  PROTIME-INR   Ct Abdomen Pelvis Wo Contrast  12/13/2011  *RADIOLOGY REPORT*  Clinical Data: Severe abdominal pain for several days, history of prostate carcinoma  CT ABDOMEN AND PELVIS WITHOUT CONTRAST  Technique:  Multidetector CT imaging of the abdomen and pelvis was performed following the standard protocol without intravenous contrast.  Comparison: CT abdomen pelvis of 02/20/2007   Findings: The lung bases are clear.  The liver is unremarkable in the unenhanced state.  No calcified gallstones are seen.  The pancreas is fatty infiltrated and the pancreatic duct is not dilated.  The adrenal glands and spleen are unremarkable.  The stomach is moderately distended with contrast and food debris and is unremarkable.  A cyst is stable emanating from the lateral right kidney.  No hydronephrosis is seen and no renal calculi are noted. The abdominal aorta is normal in caliber with atheromatous change present.  No adenopathy is seen.  An umbilical hernia has enlarged somewhat containing only a slightly dilated loop of small bowel.  No mucosal edema is seen. However, the more proximal small bowel is dilated measuring up to 4 cms in diameter consistent with partial small bowel obstruction with the umbilical hernia causing the obstruction.  No fluid is seen within the pelvis.  The urinary bladder is not distended.  The prostate is normal in size with a few small prostate clips.  Degenerative disc disease is noted diffusely throughout the lumbar spine.  IMPRESSION:    Small partial small bowel obstruction caused by an umbilical hernia.  No mucosal edema is seen.  I discussed the findings of the study with Dr. Kevan Ny at 4:25 p.m. on 12/13/2011  Original Report Authenticated By: Juline Patch, M.D.      6:34 PM D/w dr wyatt, will review ct imaging He asks me to attempt to reduce manually after pain meds  7:29 PM Pt pain controlled but unable to fully reduce hernia D/w dr wyatt will see patient  9:23 PM Dr wyatt able to reduce hernia, but will admit to hospital He requests medicine consultation but he will admit patient D/w medicine, will consult Pt stable in ED  MDM  Nursing notes reviewed and considered in documentation All labs/vitals reviewed and considered         Joya Gaskins, MD 12/13/11 2123   10:00 PM Pt is not currently on coumadin per daughter   Joya Gaskins, MD 12/13/11 2200

## 2011-12-13 NOTE — ED Notes (Signed)
Family Info: Aldon Hengst - [phone] - home, [phone] cell

## 2011-12-13 NOTE — ED Notes (Signed)
Assisted pt on bedpan. Pt is experiencing diarrhea

## 2011-12-13 NOTE — H&P (Signed)
Jared Hall is an 78 y.o. male.   Chief Complaint: Abdominal pain. HPI: CT diagnosis of partial SBO with incarcerated umbilical hernia which I was able to reduce in < 2 minutes.  Comes out immediately though.  Past Medical History  Diagnosis Date  . Umbilical hernia   . Diabetes mellitus   . Cancer     History reviewed. No pertinent past surgical history.  No family history on file. Social History:  does not have a smoking history on file. He does not have any smokeless tobacco history on file. He reports that he does not drink alcohol or use illicit drugs.  Allergies: No Known Allergies  Medications Prior to Admission  Medication Dose Route Frequency Provider Last Rate Last Dose  . HYDROmorphone (DILAUDID) injection 0.5 mg  0.5 mg Intravenous Once Joya Gaskins, MD   0.5 mg at 12/13/11 1846   No current outpatient prescriptions on file as of 12/13/2011.    Results for orders placed during the hospital encounter of 12/13/11 (from the past 48 hour(s))  CBC     Status: Abnormal   Collection Time   12/13/11  6:20 PM      Component Value Range Comment   WBC 13.8 (*) 4.0 - 10.5 (K/uL)    RBC 5.81  4.22 - 5.81 (MIL/uL)    Hemoglobin 12.7 (*) 13.0 - 17.0 (g/dL)    HCT 81.1  91.4 - 78.2 (%)    MCV 68.2 (*) 78.0 - 100.0 (fL)    MCH 21.9 (*) 26.0 - 34.0 (pg)    MCHC 32.1  30.0 - 36.0 (g/dL)    RDW 95.6  21.3 - 08.6 (%)    Platelets 200  150 - 400 (K/uL)   DIFFERENTIAL     Status: Abnormal   Collection Time   12/13/11  6:20 PM      Component Value Range Comment   Neutrophils Relative 78 (*) 43 - 77 (%)    Lymphocytes Relative 8 (*) 12 - 46 (%)    Monocytes Relative 12  3 - 12 (%)    Eosinophils Relative 2  0 - 5 (%)    Basophils Relative 0  0 - 1 (%)    Neutro Abs 10.7 (*) 1.7 - 7.7 (K/uL)    Lymphs Abs 1.1  0.7 - 4.0 (K/uL)    Monocytes Absolute 1.7 (*) 0.1 - 1.0 (K/uL)    Eosinophils Absolute 0.3  0.0 - 0.7 (K/uL)    Basophils Absolute 0.0  0.0 - 0.1 (K/uL)    RBC  Morphology POLYCHROMASIA PRESENT   ELLIPTOCYTES   Smear Review LARGE PLATELETS PRESENT     BASIC METABOLIC PANEL     Status: Abnormal   Collection Time   12/13/11  6:20 PM      Component Value Range Comment   Sodium 140  135 - 145 (mEq/L)    Potassium 3.4 (*) 3.5 - 5.1 (mEq/L)    Chloride 102  96 - 112 (mEq/L)    CO2 26  19 - 32 (mEq/L)    Glucose, Bld 101 (*) 70 - 99 (mg/dL)    BUN 25 (*) 6 - 23 (mg/dL)    Creatinine, Ser 5.78 (*) 0.50 - 1.35 (mg/dL)    Calcium 9.7  8.4 - 10.5 (mg/dL)    GFR calc non Af Amer 33 (*) >90 (mL/min)    GFR calc Af Amer 38 (*) >90 (mL/min)   LACTIC ACID, PLASMA     Status: Normal  Collection Time   12/13/11  6:27 PM      Component Value Range Comment   Lactic Acid, Venous 2.0  0.5 - 2.2 (mmol/L)   PROTIME-INR     Status: Normal   Collection Time   12/13/11  6:37 PM      Component Value Range Comment   Prothrombin Time 13.9  11.6 - 15.2 (seconds)    INR 1.05  0.00 - 1.49     Ct Abdomen Pelvis Wo Contrast  12/13/2011  *RADIOLOGY REPORT*  Clinical Data: Severe abdominal pain for several days, history of prostate carcinoma  CT ABDOMEN AND PELVIS WITHOUT CONTRAST  Technique:  Multidetector CT imaging of the abdomen and pelvis was performed following the standard protocol without intravenous contrast.  Comparison: CT abdomen pelvis of 02/20/2007  Findings: The lung bases are clear.  The liver is unremarkable in the unenhanced state.  No calcified gallstones are seen.  The pancreas is fatty infiltrated and the pancreatic duct is not dilated.  The adrenal glands and spleen are unremarkable.  The stomach is moderately distended with contrast and food debris and is unremarkable.  A cyst is stable emanating from the lateral right kidney.  No hydronephrosis is seen and no renal calculi are noted. The abdominal aorta is normal in caliber with atheromatous change present.  No adenopathy is seen.  An umbilical hernia has enlarged somewhat containing only a slightly dilated  loop of small bowel.  No mucosal edema is seen. However, the more proximal small bowel is dilated measuring up to 4 cms in diameter consistent with partial small bowel obstruction with the umbilical hernia causing the obstruction.  No fluid is seen within the pelvis.  The urinary bladder is not distended.  The prostate is normal in size with a few small prostate clips.  Degenerative disc disease is noted diffusely throughout the lumbar spine.  IMPRESSION:    Small partial small bowel obstruction caused by an umbilical hernia.  No mucosal edema is seen.  I discussed the findings of the study with Dr. Kevan Ny at 4:25 p.m. on 12/13/2011  Original Report Authenticated By: Juline Patch, M.D.    Review of Systems  Gastrointestinal: Positive for abdominal pain (chronically incarcerated umbilical hernia).  Genitourinary: Negative.   Skin: Positive for itching (scaley, thin skin).  Neurological: Positive for weakness. Negative for headaches.  Endo/Heme/Allergies: Negative.     Blood pressure 120/59, pulse 80, temperature 98.1 F (36.7 C), temperature source Oral, resp. rate 18, SpO2 96.00%. Physical Exam  Constitutional: He is oriented to person, place, and time. He appears well-developed and well-nourished.  HENT:  Head: Normocephalic and atraumatic.  Cardiovascular: Normal rate and regular rhythm.   Respiratory: Effort normal and breath sounds normal.  GI: Soft. Bowel sounds are normal. He exhibits distension (big belly). There is tenderness (mild at time of reduction). A hernia (large chronically incarcerated umbilical  hernia) is present.    Musculoskeletal: Normal range of motion.  Neurological: He is oriented to person, place, and time.  Skin: Skin is dry.     Assessment/Plan Chronically incarcerated umbilical hernia in a pretty unhealthy man, history of CVA, DM,  Poor condition. Partial SBO, but reducible hernia. Admit for pain control and possible repair semi-electively.  Luciann Gossett  III,Garey Alleva O 12/13/2011, 9:40 PM

## 2011-12-13 NOTE — ED Notes (Signed)
Dr Wyatt at bedside.  

## 2011-12-13 NOTE — ED Notes (Signed)
Report given to Thayer Ohm, [place].   Patient to be moved to 19.

## 2011-12-14 ENCOUNTER — Encounter (HOSPITAL_COMMUNITY): Payer: Self-pay | Admitting: *Deleted

## 2011-12-14 DIAGNOSIS — Z7902 Long term (current) use of antithrombotics/antiplatelets: Secondary | ICD-10-CM | POA: Diagnosis not present

## 2011-12-14 DIAGNOSIS — N189 Chronic kidney disease, unspecified: Secondary | ICD-10-CM | POA: Diagnosis not present

## 2011-12-14 DIAGNOSIS — E119 Type 2 diabetes mellitus without complications: Secondary | ICD-10-CM | POA: Diagnosis present

## 2011-12-14 DIAGNOSIS — K420 Umbilical hernia with obstruction, without gangrene: Secondary | ICD-10-CM | POA: Diagnosis present

## 2011-12-14 DIAGNOSIS — R109 Unspecified abdominal pain: Secondary | ICD-10-CM | POA: Diagnosis not present

## 2011-12-14 DIAGNOSIS — Z01810 Encounter for preprocedural cardiovascular examination: Secondary | ICD-10-CM | POA: Diagnosis not present

## 2011-12-14 DIAGNOSIS — K429 Umbilical hernia without obstruction or gangrene: Secondary | ICD-10-CM | POA: Diagnosis not present

## 2011-12-14 DIAGNOSIS — E669 Obesity, unspecified: Secondary | ICD-10-CM | POA: Diagnosis present

## 2011-12-14 DIAGNOSIS — N289 Disorder of kidney and ureter, unspecified: Secondary | ICD-10-CM | POA: Diagnosis not present

## 2011-12-14 DIAGNOSIS — R269 Unspecified abnormalities of gait and mobility: Secondary | ICD-10-CM | POA: Diagnosis present

## 2011-12-14 DIAGNOSIS — Z7982 Long term (current) use of aspirin: Secondary | ICD-10-CM | POA: Diagnosis not present

## 2011-12-14 DIAGNOSIS — D72829 Elevated white blood cell count, unspecified: Secondary | ICD-10-CM | POA: Diagnosis present

## 2011-12-14 DIAGNOSIS — N400 Enlarged prostate without lower urinary tract symptoms: Secondary | ICD-10-CM | POA: Diagnosis present

## 2011-12-14 DIAGNOSIS — K5669 Other intestinal obstruction: Secondary | ICD-10-CM | POA: Diagnosis present

## 2011-12-14 DIAGNOSIS — I471 Supraventricular tachycardia: Secondary | ICD-10-CM | POA: Diagnosis present

## 2011-12-14 DIAGNOSIS — Z01812 Encounter for preprocedural laboratory examination: Secondary | ICD-10-CM | POA: Diagnosis not present

## 2011-12-14 DIAGNOSIS — F3289 Other specified depressive episodes: Secondary | ICD-10-CM | POA: Diagnosis not present

## 2011-12-14 DIAGNOSIS — K439 Ventral hernia without obstruction or gangrene: Secondary | ICD-10-CM | POA: Diagnosis not present

## 2011-12-14 DIAGNOSIS — E782 Mixed hyperlipidemia: Secondary | ICD-10-CM | POA: Diagnosis not present

## 2011-12-14 DIAGNOSIS — E1169 Type 2 diabetes mellitus with other specified complication: Secondary | ICD-10-CM | POA: Diagnosis present

## 2011-12-14 DIAGNOSIS — E785 Hyperlipidemia, unspecified: Secondary | ICD-10-CM | POA: Diagnosis not present

## 2011-12-14 DIAGNOSIS — Z8673 Personal history of transient ischemic attack (TIA), and cerebral infarction without residual deficits: Secondary | ICD-10-CM | POA: Diagnosis not present

## 2011-12-14 DIAGNOSIS — R5381 Other malaise: Secondary | ICD-10-CM | POA: Diagnosis present

## 2011-12-14 DIAGNOSIS — F329 Major depressive disorder, single episode, unspecified: Secondary | ICD-10-CM | POA: Diagnosis present

## 2011-12-14 DIAGNOSIS — Z6835 Body mass index (BMI) 35.0-35.9, adult: Secondary | ICD-10-CM | POA: Diagnosis not present

## 2011-12-14 DIAGNOSIS — Z8679 Personal history of other diseases of the circulatory system: Secondary | ICD-10-CM

## 2011-12-14 DIAGNOSIS — E860 Dehydration: Secondary | ICD-10-CM | POA: Diagnosis not present

## 2011-12-14 DIAGNOSIS — Z9889 Other specified postprocedural states: Secondary | ICD-10-CM | POA: Diagnosis not present

## 2011-12-14 DIAGNOSIS — Z79899 Other long term (current) drug therapy: Secondary | ICD-10-CM | POA: Diagnosis not present

## 2011-12-14 DIAGNOSIS — Z794 Long term (current) use of insulin: Secondary | ICD-10-CM | POA: Diagnosis not present

## 2011-12-14 LAB — COMPREHENSIVE METABOLIC PANEL
ALT: 13 U/L (ref 0–53)
AST: 15 U/L (ref 0–37)
Albumin: 3.1 g/dL — ABNORMAL LOW (ref 3.5–5.2)
Alkaline Phosphatase: 57 U/L (ref 39–117)
Potassium: 3.6 mEq/L (ref 3.5–5.1)
Sodium: 141 mEq/L (ref 135–145)
Total Protein: 6.3 g/dL (ref 6.0–8.3)

## 2011-12-14 LAB — PROTIME-INR
INR: 1.12 (ref 0.00–1.49)
Prothrombin Time: 14.6 s (ref 11.6–15.2)

## 2011-12-14 LAB — GLUCOSE, CAPILLARY
Glucose-Capillary: 114 mg/dL — ABNORMAL HIGH (ref 70–99)
Glucose-Capillary: 156 mg/dL — ABNORMAL HIGH (ref 70–99)
Glucose-Capillary: 69 mg/dL — ABNORMAL LOW (ref 70–99)
Glucose-Capillary: 71 mg/dL (ref 70–99)
Glucose-Capillary: 83 mg/dL (ref 70–99)

## 2011-12-14 LAB — CBC
HCT: 35.4 % — ABNORMAL LOW (ref 39.0–52.0)
MCH: 21.7 pg — ABNORMAL LOW (ref 26.0–34.0)
MCHC: 31.9 g/dL (ref 30.0–36.0)
MCV: 67.9 fL — ABNORMAL LOW (ref 78.0–100.0)
RDW: 14.1 % (ref 11.5–15.5)
WBC: 10.9 10*3/uL — ABNORMAL HIGH (ref 4.0–10.5)

## 2011-12-14 MED ORDER — FINASTERIDE 5 MG PO TABS
5.0000 mg | ORAL_TABLET | Freq: Every day | ORAL | Status: DC
  Administered 2011-12-14 – 2011-12-22 (×9): 5 mg via ORAL
  Filled 2011-12-14 (×9): qty 1

## 2011-12-14 MED ORDER — CHLORHEXIDINE GLUCONATE 0.12 % MT SOLN
15.0000 mL | Freq: Two times a day (BID) | OROMUCOSAL | Status: DC
  Administered 2011-12-14 – 2011-12-16 (×5): 15 mL via OROMUCOSAL
  Filled 2011-12-14 (×5): qty 15

## 2011-12-14 MED ORDER — DEXTROSE 50 % IV SOLN
INTRAVENOUS | Status: AC
  Administered 2011-12-14: 25 mL via INTRAVENOUS
  Filled 2011-12-14: qty 50

## 2011-12-14 MED ORDER — PREGABALIN 50 MG PO CAPS
50.0000 mg | ORAL_CAPSULE | Freq: Two times a day (BID) | ORAL | Status: DC
  Administered 2011-12-14 – 2011-12-22 (×15): 50 mg via ORAL
  Filled 2011-12-14 (×15): qty 1

## 2011-12-14 MED ORDER — OLMESARTAN MEDOXOMIL 40 MG PO TABS
40.0000 mg | ORAL_TABLET | Freq: Every day | ORAL | Status: DC
  Administered 2011-12-14 – 2011-12-21 (×8): 40 mg via ORAL
  Filled 2011-12-14 (×9): qty 1

## 2011-12-14 MED ORDER — DEXTROSE 50 % IV SOLN
25.0000 mL | Freq: Once | INTRAVENOUS | Status: AC | PRN
  Administered 2011-12-14: 25 mL via INTRAVENOUS

## 2011-12-14 MED ORDER — ONDANSETRON HCL 4 MG/2ML IJ SOLN
4.0000 mg | Freq: Four times a day (QID) | INTRAMUSCULAR | Status: DC | PRN
  Administered 2011-12-16 – 2011-12-17 (×3): 4 mg via INTRAVENOUS
  Filled 2011-12-14 (×5): qty 2

## 2011-12-14 MED ORDER — POTASSIUM CHLORIDE IN NACL 20-0.9 MEQ/L-% IV SOLN
INTRAVENOUS | Status: DC
  Administered 2011-12-14: 100 mL/h via INTRAVENOUS
  Administered 2011-12-14 – 2011-12-15 (×2): via INTRAVENOUS
  Administered 2011-12-15: 10 mL/h via INTRAVENOUS
  Filled 2011-12-14 (×7): qty 1000

## 2011-12-14 MED ORDER — MORPHINE SULFATE 2 MG/ML IJ SOLN
2.0000 mg | INTRAMUSCULAR | Status: DC | PRN
  Administered 2011-12-14 – 2011-12-15 (×3): 4 mg via INTRAVENOUS
  Administered 2011-12-15 (×2): 2 mg via INTRAVENOUS
  Administered 2011-12-15 – 2011-12-16 (×7): 4 mg via INTRAVENOUS
  Administered 2011-12-17 – 2011-12-18 (×7): 2 mg via INTRAVENOUS
  Administered 2011-12-18 – 2011-12-20 (×3): 4 mg via INTRAVENOUS
  Filled 2011-12-14: qty 2
  Filled 2011-12-14: qty 1
  Filled 2011-12-14: qty 2
  Filled 2011-12-14: qty 1
  Filled 2011-12-14: qty 2
  Filled 2011-12-14: qty 1
  Filled 2011-12-14 (×3): qty 2
  Filled 2011-12-14 (×2): qty 1
  Filled 2011-12-14 (×3): qty 2
  Filled 2011-12-14: qty 1
  Filled 2011-12-14: qty 2
  Filled 2011-12-14 (×3): qty 1
  Filled 2011-12-14 (×3): qty 2

## 2011-12-14 MED ORDER — SIMVASTATIN 20 MG PO TABS
20.0000 mg | ORAL_TABLET | Freq: Every day | ORAL | Status: DC
  Administered 2011-12-14 – 2011-12-21 (×8): 20 mg via ORAL
  Filled 2011-12-14 (×9): qty 1

## 2011-12-14 MED ORDER — DARIFENACIN HYDROBROMIDE ER 7.5 MG PO TB24
7.5000 mg | ORAL_TABLET | Freq: Every day | ORAL | Status: DC
  Administered 2011-12-14 – 2011-12-22 (×9): 7.5 mg via ORAL
  Filled 2011-12-14 (×9): qty 1

## 2011-12-14 MED ORDER — PANTOPRAZOLE SODIUM 40 MG PO TBEC
40.0000 mg | DELAYED_RELEASE_TABLET | Freq: Every day | ORAL | Status: DC
  Administered 2011-12-14 – 2011-12-21 (×7): 40 mg via ORAL
  Filled 2011-12-14 (×6): qty 1

## 2011-12-14 NOTE — Progress Notes (Signed)
.    Subjective: He denies abdominal pain, nausea or vomiting.  He has not taken Plavix or Coumadin recently.  Hernia has been there for many years, but has been getting larger recently.  Objective: Vital signs in last 24 hours: Temp:  [98.1 F (36.7 C)-98.5 F (36.9 C)] 98.5 F (36.9 C) (01/24 0510) Pulse Rate:  [80-88] 80  (01/24 0510) Resp:  [16-18] 18  (01/24 0510) BP: (98-150)/(44-92) 150/81 mmHg (01/24 0510) SpO2:  [92 %-97 %] 97 % (01/24 0510) Weight:  [251 lb 1.7 oz (113.9 kg)] 251 lb 1.7 oz (113.9 kg) (01/24 0143) Last BM Date: 12/13/11  Intake/Output from previous day: 01/23 0701 - 01/24 0700 In: 348.3 [I.V.:348.3] Out: 400 [Urine:400] Intake/Output this shift:    PE: Gen-Obese, NAD.  Abd-soft and obese, reducible umbilical hernia   Lab Results:   Basename 12/14/11 0520 12/13/11 1820  WBC 10.9* 13.8*  HGB 11.3* 12.7*  HCT 35.4* 39.6  PLT 193 200   BMET  Basename 12/14/11 0520 12/13/11 1820  NA 141 140  K 3.6 3.4*  CL 105 102  CO2 28 26  GLUCOSE 79 101*  BUN 21 25*  CREATININE 1.53* 1.87*  CALCIUM 9.0 9.7   PT/INR  Basename 12/14/11 0520 12/13/11 1837  LABPROT 14.6 13.9  INR 1.12 1.05   Comprehensive Metabolic Panel:    Component Value Date/Time   NA 141 12/14/2011 0520   K 3.6 12/14/2011 0520   CL 105 12/14/2011 0520   CO2 28 12/14/2011 0520   BUN 21 12/14/2011 0520   CREATININE 1.53* 12/14/2011 0520   GLUCOSE 79 12/14/2011 0520   CALCIUM 9.0 12/14/2011 0520   AST 15 12/14/2011 0520   ALT 13 12/14/2011 0520   ALKPHOS 57 12/14/2011 0520   BILITOT 0.5 12/14/2011 0520   PROT 6.3 12/14/2011 0520   ALBUMIN 3.1* 12/14/2011 0520     Studies/Results: Ct Abdomen Pelvis Wo Contrast  12/13/2011  *RADIOLOGY REPORT*  Clinical Data: Severe abdominal pain for several days, history of prostate carcinoma  CT ABDOMEN AND PELVIS WITHOUT CONTRAST  Technique:  Multidetector CT imaging of the abdomen and pelvis was performed following the standard protocol without  intravenous contrast.  Comparison: CT abdomen pelvis of 02/20/2007  Findings: The lung bases are clear.  The liver is unremarkable in the unenhanced state.  No calcified gallstones are seen.  The pancreas is fatty infiltrated and the pancreatic duct is not dilated.  The adrenal glands and spleen are unremarkable.  The stomach is moderately distended with contrast and food debris and is unremarkable.  A cyst is stable emanating from the lateral right kidney.  No hydronephrosis is seen and no renal calculi are noted. The abdominal aorta is normal in caliber with atheromatous change present.  No adenopathy is seen.  An umbilical hernia has enlarged somewhat containing only a slightly dilated loop of small bowel.  No mucosal edema is seen. However, the more proximal small bowel is dilated measuring up to 4 cms in diameter consistent with partial small bowel obstruction with the umbilical hernia causing the obstruction.  No fluid is seen within the pelvis.  The urinary bladder is not distended.  The prostate is normal in size with a few small prostate clips.  Degenerative disc disease is noted diffusely throughout the lumbar spine.  IMPRESSION:    Small partial small bowel obstruction caused by an umbilical hernia.  No mucosal edema is seen.  I discussed the findings of the study with Dr. Kevan Ny at  4:25 p.m. on 12/13/2011  Original Report Authenticated By: Juline Patch, M.D.    Anti-infectives: Anti-infectives    None      Assessment Active Problems:  Umbilical hernia with obstruction-partial on CT scan; easily reducible-no clinical signs of obstruction   History of PSVT (paroxysmal supraventricular tachycardia)  DM (diabetes mellitus)    LOS: 1 day   Plan: Discussed laparoscopic or open repair of hernia with mesh with him.  I have discussed the procedure, risks, and aftercare. Risks include but are not limited to bleeding, infection, wound healing problems, anesthesia, recurrence, accidental injury  to intra-abdominal organs-such as intestine, liver, spleen, bladder, etc. We also discussed the rare complication of mesh rejection. All questions were answered.  Will try and talk with Dr. Kevan Ny to see if he is an acceptable operative candidate.   Myanna Ziesmer J 12/14/2011

## 2011-12-14 NOTE — Progress Notes (Signed)
CBG: 69   Treatment: D50 IV 25 mL  Symptoms: None  Follow-up CBG: Time:0211 CBG Result:125  Possible Reasons for Event: Inadequate meal intake  Comments/MD notified:Dr. Lindie Spruce, no new orders received    Neva Seat

## 2011-12-14 NOTE — Progress Notes (Signed)
Patient admitted from emergency department via stretcher.  Alert and oriented to person, self, place, and situation.  IV intact to right forearm.  Patient oriented to room and unit; safety video viewed.  Stage II pressure ulcer located on right inner buttock.  Dry/flaky skin, bruising to BUE, scattered scabs to BUE and to left shin.  Denies pain.  Umbilical hernia present.  Will continue to monitor.

## 2011-12-14 NOTE — Progress Notes (Signed)
INITIAL ADULT NUTRITION ASSESSMENT Date: 12/14/2011   Time: 10:40 AM  Reason for Assessment: Nutrition Risk report- "Dysphagia"  ASSESSMENT: Male 78 y.o.  Dx: Umbilical hernia with partial small bowel obstruction  Hx:  Past Medical History  Diagnosis Date  . Umbilical hernia   . Diabetes mellitus   . Cancer     Related Meds:     . chlorhexidine  15 mL Mouth Rinse BID  . darifenacin  7.5 mg Oral Daily  . finasteride  5 mg Oral Daily  .  HYDROmorphone (DILAUDID) injection  0.5 mg Intravenous Once  . olmesartan  40 mg Oral Daily  . pantoprazole  40 mg Oral Daily  . pregabalin  50 mg Oral BID  . simvastatin  20 mg Oral q1800     Ht: 5\' 11"  (180.3 cm)  Wt: 251 lb 1.7 oz (113.9 kg)  Ideal Wt: 78.2 kg  % Ideal Wt: 145.7%  Usual Wt: 113.9 kg- Patient denies recent weight loss % Usual Wt: 100%   Body mass index is 35.02 kg/(m^2). Class II Obesity  Food/Nutrition Related Hx: Patient has Stage II pressure ulcer on right buttcocks.  He had CBG: 69 on 01/24 AM d/t inadequate meal intake per RN.  Patient does not follow DM diet at home, but gives himself Lantus twice/day to control blood sugars. He also reported a healthcare aid visits him three days/week to assist him with daily activities and prepare meals.  Labs:  CMP     Component Value Date/Time   NA 141 12/14/2011 0520   K 3.6 12/14/2011 0520   CL 105 12/14/2011 0520   CO2 28 12/14/2011 0520   GLUCOSE 79 12/14/2011 0520   BUN 21 12/14/2011 0520   CREATININE 1.53* 12/14/2011 0520   CALCIUM 9.0 12/14/2011 0520   PROT 6.3 12/14/2011 0520   ALBUMIN 3.1* 12/14/2011 0520   AST 15 12/14/2011 0520   ALT 13 12/14/2011 0520   ALKPHOS 57 12/14/2011 0520   BILITOT 0.5 12/14/2011 0520   GFRNONAA 42* 12/14/2011 0520   GFRAA 48* 12/14/2011 0520   CBG (last 3)   Basename 12/14/11 0416 12/14/11 0211 12/14/11 0147  GLUCAP 83 125* 69*      Lab Results  Component Value Date   HGBA1C  Value: 9.0 (NOTE) The ADA recommends the  following therapeutic goal for glycemic control related to Hgb A1c measurement: Goal of therapy: <6.5 Hgb A1c  Reference: American Diabetes Association: Clinical Practice Recommendations 2010, Diabetes Care, 2010, 33: (Suppl  1).* 03/18/2009    Intake:   Intake/Output Summary (Last 24 hours) at 12/14/11 1046 Last data filed at 12/14/11 0600  Gross per 24 hour  Intake 348.33 ml  Output    400 ml  Net -51.67 ml   Diet Order: Carb Control Medium 1600-2000: Ordered this AM (01/24)  Supplements/Tube Feeding: N/A  IVF:    0.9 % NaCl with KCl 20 mEq / L Last Rate: 100 mL/hr at 12/14/11 0231    Estimated Nutritional Needs:   Kcal:2075-2275 Protein:90-100 gm Fluid: > 2.0 L  Patient reported having a very good appetite. He seemed slightly confused, but was able to answer majority of questions. He is missing some teeth, which may inhibit chewing certain foods, as was stated in the nutrition screening. However, patient stated he is able to eat most foods at home and has no history of weight loss.   NUTRITION DIAGNOSIS: No nutrition diagnosis at this time  MONITORING/EVALUATION(Goals): Goal: PO intake adequate to meet estimated needs  to prepare for upcoming surgery  Monitor: Adequacy of PO intake   EDUCATION NEEDS: - No needs identified at this time  INTERVENTION: - Consider rechecking HgbA1c in order to monitor recent glycemic control d/t newest hgbA1c being taken in 2010   Dietitian #[phone]  DOCUMENTATION CODES Per approved criteria  - Obesity unspecified    Lloyd Huger 12/14/2011, 10:40 AM  Sanjuan Dame, Sheliah Hatch Pager [phone]

## 2011-12-14 NOTE — Progress Notes (Addendum)
Subjective: Patient with no symptoms to suggest unstable angina or respiratory compromise. Could likely tolerate surgery reasonably well  Objective: Weight change:   Intake/Output Summary (Last 24 hours) at 12/14/11 0829 Last data filed at 12/14/11 0600  Gross per 24 hour  Intake 348.33 ml  Output    400 ml  Net -51.67 ml    General Appearance: Alert, cooperative, no distress, appears stated age Head: Normocephalic, without obvious abnormality, atraumatic Neck: Supple, symmetrical Lungs: Clear to auscultation bilaterally, respirations unlabored Heart: Regular rate and rhythm, S1 and S2 normal, no murmur, rub or gallop Abdomen: Soft, reducible umbilical hernia Extremities: Extremities normal, atraumatic, no cyanosis or edema Pulses: 2+ and symmetric all extremities Skin: Skin color, texture, turgor normal, no rashes or lesions Neuro: CNII-XII intact. Normal strength, sensation and reflexes throughout   Lab Results:  [facility] 12/14/11 0520 12/13/11 1820  NA 141 140  K 3.6 3.4*  CL 105 102  CO2 28 26  GLUCOSE 79 101*  BUN 21 25*  CREATININE 1.53* 1.87*  CALCIUM 9.0 9.7  MG -- --  PHOS -- --    Basename 12/14/11 0520  AST 15  ALT 13  ALKPHOS 57  BILITOT 0.5  PROT 6.3  ALBUMIN 3.1*   No results found for this basename: LIPASE:2,AMYLASE:2 in the last 72 hours  Basename 12/14/11 0520 12/13/11 1820  WBC 10.9* 13.8*  NEUTROABS -- 10.7*  HGB 11.3* 12.7*  HCT 35.4* 39.6  MCV 67.9* 68.2*  PLT 193 200   No results found for this basename: CKTOTAL:3,CKMB:3,CKMBINDEX:3,TROPONINI:3 in the last 72 hours No components found with this basename: POCBNP:3 No results found for this basename: DDIMER:2 in the last 72 hours No results found for this basename: HGBA1C:2 in the last 72 hours No results found for this basename: CHOL:2,HDL:2,LDLCALC:2,TRIG:2,CHOLHDL:2,LDLDIRECT:2 in the last 72 hours No results found for this basename: TSH,T4TOTAL,FREET3,T3FREE,THYROIDAB in the last  72 hours No results found for this basename: VITAMINB12:2,FOLATE:2,FERRITIN:2,TIBC:2,IRON:2,RETICCTPCT:2 in the last 72 hours  Studies/Results: Ct Abdomen Pelvis Wo Contrast  12/13/2011  *RADIOLOGY REPORT*  Clinical Data: Severe abdominal pain for several days, history of prostate carcinoma  CT ABDOMEN AND PELVIS WITHOUT CONTRAST  Technique:  Multidetector CT imaging of the abdomen and pelvis was performed following the standard protocol without intravenous contrast.  Comparison: CT abdomen pelvis of 02/20/2007  Findings: The lung bases are clear.  The liver is unremarkable in the unenhanced state.  No calcified gallstones are seen.  The pancreas is fatty infiltrated and the pancreatic duct is not dilated.  The adrenal glands and spleen are unremarkable.  The stomach is moderately distended with contrast and food debris and is unremarkable.  A cyst is stable emanating from the lateral right kidney.  No hydronephrosis is seen and no renal calculi are noted. The abdominal aorta is normal in caliber with atheromatous change present.  No adenopathy is seen.  An umbilical hernia has enlarged somewhat containing only a slightly dilated loop of small bowel.  No mucosal edema is seen. However, the more proximal small bowel is dilated measuring up to 4 cms in diameter consistent with partial small bowel obstruction with the umbilical hernia causing the obstruction.  No fluid is seen within the pelvis.  The urinary bladder is not distended.  The prostate is normal in size with a few small prostate clips.  Degenerative disc disease is noted diffusely throughout the lumbar spine.  IMPRESSION:    Small partial small bowel obstruction caused by an umbilical hernia.  No mucosal edema  is seen.  I discussed the findings of the study with Dr. Kevan Ny at 4:25 p.m. on 12/13/2011  Original Report Authenticated By: Juline Patch, M.D.   Medications: Scheduled Meds:   . chlorhexidine  15 mL Mouth Rinse BID  . darifenacin  7.5 mg  Oral Daily  . finasteride  5 mg Oral Daily  .  HYDROmorphone (DILAUDID) injection  0.5 mg Intravenous Once  . olmesartan  40 mg Oral Daily  . pantoprazole  40 mg Oral Daily  . pregabalin  50 mg Oral BID  . simvastatin  20 mg Oral q1800   Continuous Infusions:   . 0.9 % NaCl with KCl 20 mEq / L 100 mL/hr at 12/14/11 0231   PRN Meds:.dextrose, morphine, ondansetron  Assessment/Plan: Patient Active Problem List  Diagnoses Date Noted  . Umbilical hernia with obstruction-partial on CT scan; easily reducible - suspect he would tolerate surgery well  12/14/2011  . History of PSVT (paroxysmal supraventricular tachycardia) 12/14/2011  . DM (diabetes mellitus) History of CVA  Obesity  Gait disorder, multifactorial  Hyperlipidemia Has typically been on Coumadin therapy for what was felt to be thromboembolic CVA on aspirin/Plavix. We'll review old records  12/14/2011     LOS: 1 day   Jared Hall 12/14/2011, 8:29 AM

## 2011-12-14 NOTE — ED Notes (Signed)
Pt. Transferred to the floor via stretcher with tech.

## 2011-12-14 NOTE — Progress Notes (Signed)
Notified Dr. Lindie Spruce of patient's CBG of 21; patient asympomatic.  Dr. Lindie Spruce aware of former CBG of 69 and follow-up CBG of 125.  Patient NPO.  No new orders received.  Will continue to monitor.

## 2011-12-14 NOTE — Progress Notes (Signed)
Patient's CBG 69.  Dr. Lindie Spruce notified of patient's CBG.  New orders received for diet order and CBG checks.  Will continue to monitor.

## 2011-12-14 NOTE — ED Notes (Signed)
Pt. OOB to BR with 1 assist., gait unsteady, large amount of diarrhea noted, pt returned to bed, c/o abd. Pain 10/10

## 2011-12-14 NOTE — Progress Notes (Signed)
I spoke with Dr. Kevan Ny and Mr. Sardina' son and daughter in law.  He is on Aspirin and Plavix.  He no longer needs and urgent operation as the hernia is easily reducible.  Will hold the Plavix and Aspirin.  Will start him on a diet and place an abdominal binder on him.  If he does okay with this, will discharge him tomorrow and arrange for an elective hernia repair in the next week or two.

## 2011-12-15 LAB — GLUCOSE, CAPILLARY
Glucose-Capillary: 155 mg/dL — ABNORMAL HIGH (ref 70–99)
Glucose-Capillary: 129 mg/dL — ABNORMAL HIGH (ref 70–99)
Glucose-Capillary: 141 mg/dL — ABNORMAL HIGH (ref 70–99)

## 2011-12-15 MED ORDER — INSULIN GLARGINE 100 UNIT/ML ~~LOC~~ SOLN
40.0000 [IU] | Freq: Two times a day (BID) | SUBCUTANEOUS | Status: DC
  Administered 2011-12-15 – 2011-12-18 (×7): 40 [IU] via SUBCUTANEOUS
  Filled 2011-12-15: qty 3

## 2011-12-15 MED ORDER — INSULIN ASPART 100 UNIT/ML ~~LOC~~ SOLN
0.0000 [IU] | Freq: Three times a day (TID) | SUBCUTANEOUS | Status: DC
  Administered 2011-12-15: 1 [IU] via SUBCUTANEOUS
  Filled 2011-12-15: qty 3

## 2011-12-15 NOTE — Progress Notes (Signed)
Patient seen and examined.  Agree with PA's note.  Plan hernia repair next week.

## 2011-12-15 NOTE — Progress Notes (Signed)
Rn noticed a redness on patient's right arm. I asked the patient if he had an IV in the area of where the redness is an he did. It is tender to touch and warm. Rn elevated and put warm compress on it. Call MD he is aware.

## 2011-12-15 NOTE — Progress Notes (Signed)
Subjective: Jared Hall was complaining of abdominal pain this morning after eating, periumbilical. Otherwise, feeling better  Objective: Weight change:   Intake/Output Summary (Last 24 hours) at 12/15/11 1802 Last data filed at 12/15/11 1400  Gross per 24 hour  Intake 1608.33 ml  Output    427 ml  Net 1181.33 ml   Filed Vitals:   12/14/11 2155 12/15/11 0530 12/15/11 0741 12/15/11 1451  BP: 141/59 139/62 142/55 135/54  Pulse: 73 72 65 86  Temp: 98.7 F (37.1 C) 98.2 F (36.8 C) 97.7 F (36.5 C) 98.1 F (36.7 C)  TempSrc:   Oral Oral  Resp: 18 18 22 20   Height:      Weight:      SpO2: 95% 95% 95% 100%    General Appearance: Alert, cooperative, no distress, appears stated age  Head: Normocephalic, without obvious abnormality, atraumatic  Neck: Supple, symmetrical  Lungs: Clear to auscultation bilaterally, respirations unlabored  Heart: Regular rate and rhythm, S1 and S2 normal, no murmur, rub or gallop  Abdomen: Soft, reducible umbilical hernia, has been uncomfortable at times during the day  Extremities: right upper extremity with some swelling in the right forearm and erythema where he had a prior IV site attempt. Pulses: 2+ and symmetric all extremities  Skin: Skin color, texture, turgor normal, no rashes or lesions  Neuro: CNII-XII intact. Normal strength, sensation and reflexes throughout    Lab Results:  [facility] 12/14/11 0520 12/13/11 1820  NA 141 140  K 3.6 3.4*  CL 105 102  CO2 28 26  GLUCOSE 79 101*  BUN 21 25*  CREATININE 1.53* 1.87*  CALCIUM 9.0 9.7  MG -- --  PHOS -- --    Basename 12/14/11 0520  AST 15  ALT 13  ALKPHOS 57  BILITOT 0.5  PROT 6.3  ALBUMIN 3.1*   No results found for this basename: LIPASE:2,AMYLASE:2 in the last 72 hours  Basename 12/14/11 0520 12/13/11 1820  WBC 10.9* 13.8*  NEUTROABS -- 10.7*  HGB 11.3* 12.7*  HCT 35.4* 39.6  MCV 67.9* 68.2*  PLT 193 200   No results found for this basename:  CKTOTAL:3,CKMB:3,CKMBINDEX:3,TROPONINI:3 in the last 72 hours No components found with this basename: POCBNP:3 No results found for this basename: DDIMER:2 in the last 72 hours No results found for this basename: HGBA1C:2 in the last 72 hours No results found for this basename: CHOL:2,HDL:2,LDLCALC:2,TRIG:2,CHOLHDL:2,LDLDIRECT:2 in the last 72 hours No results found for this basename: TSH,T4TOTAL,FREET3,T3FREE,THYROIDAB in the last 72 hours No results found for this basename: VITAMINB12:2,FOLATE:2,FERRITIN:2,TIBC:2,IRON:2,RETICCTPCT:2 in the last 72 hours  Studies/Results: No results found. Medications: Scheduled Meds:   . chlorhexidine  15 mL Mouth Rinse BID  . darifenacin  7.5 mg Oral Daily  . finasteride  5 mg Oral Daily  . insulin aspart  0-9 Units Subcutaneous TID WC  . insulin glargine  40 Units Subcutaneous BID  . olmesartan  40 mg Oral Daily  . pantoprazole  40 mg Oral Daily  . pregabalin  50 mg Oral BID  . simvastatin  20 mg Oral q1800   Continuous Infusions:   . 0.9 % NaCl with KCl 20 mEq / L 10 mL/hr (12/15/11 1156)   PRN Meds:.morphine, ondansetron  Assessment/Plan:  Patient Active Problem List   Diagnoses  Date Noted   .  Umbilical hernia with obstruction-partial on CT scan; easily reducible - suspect he would tolerate surgery well. Planned for January 28 12/14/2011   .  History of PSVT (paroxysmal supraventricular tachycardia) - controlled  12/14/2011   .  DM (diabetes mellitus) - controlled History of CVA - switch from Coumadin to aspirin and Plavix earlier this year secondary to fall risk Obesity  Gait disorder, multifactorial  Hyperlipidemia BPH - symptomatically doing okay     LOS: 2 days   Jared Hall NEVILL 12/15/2011, 6:02 PM

## 2011-12-15 NOTE — Progress Notes (Signed)
Subjective: Pt c/o a lot of pain at hernia site and would prefer to get hernia fixed while here. He did eat reg breakfast this morning without issue. He is passing flatus  Objective: Vital signs in last 24 hours: Temp:  [97.7 F (36.5 C)-98.7 F (37.1 C)] 97.7 F (36.5 C) (01/25 0741) Pulse Rate:  [65-82] 65  (01/25 0741) Resp:  [18-22] 22  (01/25 0741) BP: (132-142)/(55-71) 142/55 mmHg (01/25 0741) SpO2:  [95 %-96 %] 95 % (01/25 0741) Last BM Date: 12/13/11  Intake/Output this shift:    Physical Exam: BP 142/55  Pulse 65  Temp(Src) 97.7 F (36.5 C) (Oral)  Resp 22  Ht 5\' 11"  (1.803 m)  Wt 113.9 kg (251 lb 1.7 oz)  BMI 35.02 kg/m2  SpO2 95% Abdomen: obese. Umbilical hernia soft, reducible, no erythema but it is tender to manipulate.  Labs: CBC  Basename 12/14/11 0520 12/13/11 1820  WBC 10.9* 13.8*  HGB 11.3* 12.7*  HCT 35.4* 39.6  PLT 193 200   BMET  Basename 12/14/11 0520 12/13/11 1820  NA 141 140  K 3.6 3.4*  CL 105 102  CO2 28 26  GLUCOSE 79 101*  BUN 21 25*  CREATININE 1.53* 1.87*  CALCIUM 9.0 9.7   LFT  Basename 12/14/11 0520  PROT 6.3  ALBUMIN 3.1*  AST 15  ALT 13  ALKPHOS 57  BILITOT 0.5  BILIDIR --  IBILI --  LIPASE --   PT/INR  Basename 12/14/11 0520 12/13/11 1837  LABPROT 14.6 13.9  INR 1.12 1.05   ABG No results found for this basename: PHART:2,PCO2:2,PO2:2,HCO3:2 in the last 72 hours  Studies/Results: Ct Abdomen Pelvis Wo Contrast  12/13/2011  *RADIOLOGY REPORT*  Clinical Data: Severe abdominal pain for several days, history of prostate carcinoma  CT ABDOMEN AND PELVIS WITHOUT CONTRAST  Technique:  Multidetector CT imaging of the abdomen and pelvis was performed following the standard protocol without intravenous contrast.  Comparison: CT abdomen pelvis of 02/20/2007  Findings: The lung bases are clear.  The liver is unremarkable in the unenhanced state.  No calcified gallstones are seen.  The pancreas is fatty infiltrated and  the pancreatic duct is not dilated.  The adrenal glands and spleen are unremarkable.  The stomach is moderately distended with contrast and food debris and is unremarkable.  A cyst is stable emanating from the lateral right kidney.  No hydronephrosis is seen and no renal calculi are noted. The abdominal aorta is normal in caliber with atheromatous change present.  No adenopathy is seen.  An umbilical hernia has enlarged somewhat containing only a slightly dilated loop of small bowel.  No mucosal edema is seen. However, the more proximal small bowel is dilated measuring up to 4 cms in diameter consistent with partial small bowel obstruction with the umbilical hernia causing the obstruction.  No fluid is seen within the pelvis.  The urinary bladder is not distended.  The prostate is normal in size with a few small prostate clips.  Degenerative disc disease is noted diffusely throughout the lumbar spine.  IMPRESSION:    Small partial small bowel obstruction caused by an umbilical hernia.  No mucosal edema is seen.  I discussed the findings of the study with Dr. Kevan Ny at 4:25 p.m. on 12/13/2011  Original Report Authenticated By: Juline Patch, M.D.    Assessment: Active Problems:  Umbilical hernia with obstruction-partial on CT scan; easily reducible  History of PSVT (paroxysmal supraventricular tachycardia)  DM (diabetes mellitus)  Plan: Continue to hold ASA and Plavix. Given continued pain, may need to keep pt here and plan for hernia repair early next week, once off Plavix for minimum 5 days.  LOS: 2 days    Alyse Low 12/15/2011

## 2011-12-16 DIAGNOSIS — E669 Obesity, unspecified: Secondary | ICD-10-CM | POA: Diagnosis present

## 2011-12-16 DIAGNOSIS — Z8673 Personal history of transient ischemic attack (TIA), and cerebral infarction without residual deficits: Secondary | ICD-10-CM

## 2011-12-16 DIAGNOSIS — N289 Disorder of kidney and ureter, unspecified: Secondary | ICD-10-CM

## 2011-12-16 DIAGNOSIS — E785 Hyperlipidemia, unspecified: Secondary | ICD-10-CM

## 2011-12-16 LAB — LIPID PANEL
LDL Cholesterol: 49 mg/dL (ref 0–99)
VLDL: 25 mg/dL (ref 0–40)

## 2011-12-16 LAB — GLUCOSE, CAPILLARY
Glucose-Capillary: 93 mg/dL (ref 70–99)
Glucose-Capillary: 106 mg/dL — ABNORMAL HIGH (ref 70–99)

## 2011-12-16 NOTE — Progress Notes (Signed)
Subjective: Continued moderate discomfort at site of umbilical hernia.  Was able to eat normally yesterday.  Had two BM's yesterday.  No new complaints.  Objective: Vital signs in last 24 hours: Temp:  [97.7 F (36.5 C)-98.7 F (37.1 C)] 98.7 F (37.1 C) (01/25 2135) Pulse Rate:  [65-86] 77  (01/25 2135) Resp:  [18-22] 18  (01/25 2135) BP: (135-156)/(54-55) 156/55 mmHg (01/25 2135) SpO2:  [94 %-100 %] 94 % (01/25 2135) Weight change:  Last BM Date: 12/13/11  Intake/Output from previous day: 01/25 0701 - 01/26 0700 In: 652 [I.V.:652] Out: 200 [Urine:200] Intake/Output this shift:    General appearance: alert, cooperative and no distress HEENT: MMM CV: RRR no mrg ABD: bowel sounds are decreased, abd is soft, some tenderness over umbilical hernia, hernia is soft, mostly reducible,  EXT: no edema over LE's, pulses +1, warm  Lab Results:  [facility] 12/14/11 0520 12/13/11 1820  WBC 10.9* 13.8*  HGB 11.3* 12.7*  HCT 35.4* 39.6  PLT 193 200   BMET  Basename 12/14/11 0520 12/13/11 1820  NA 141 140  K 3.6 3.4*  CL 105 102  CO2 28 26  GLUCOSE 79 101*  BUN 21 25*  CREATININE 1.53* 1.87*  CALCIUM 9.0 9.7    Studies/Results: No results found.  Medications:  Prior to Admission:  Prescriptions prior to admission  Medication Sig Dispense Refill  . citalopram (CELEXA) 40 MG tablet Take 20 mg by mouth daily.      . clopidogrel (PLAVIX) 75 MG tablet Take 75 mg by mouth daily.      . finasteride (PROSCAR) 5 MG tablet Take 5 mg by mouth daily.      [name] Kitchen HYDROcodone-acetaminophen (VICODIN) 5-500 MG per tablet Take 1 tablet by mouth 3 (three) times daily.      . insulin glargine (LANTUS) 100 UNIT/ML injection Inject 40 Units into the skin 2 (two) times daily.      . pantoprazole (PROTONIX) 40 MG tablet Take 40 mg by mouth daily.      . pregabalin (LYRICA) 50 MG capsule Take 50 mg by mouth 2 (two) times daily.      . simvastatin (ZOCOR) 20 MG tablet Take 20 mg by mouth every  evening.      . solifenacin (VESICARE) 5 MG tablet Take 5 mg by mouth daily.       . valsartan (DIOVAN) 320 MG tablet Take 160 mg by mouth daily.      . vitamin B-12 (CYANOCOBALAMIN) 1000 MCG tablet Take 1,000 mcg by mouth daily.      [name] Kitchen warfarin (COUMADIN) 5 MG tablet Take 2.5-5 mg by mouth daily. Takes 0.5 tablet (2.5 MG) every day except on Friday; on Fridays, take 1 tablet (5 MG)      . zolpidem (AMBIEN) 10 MG tablet Take 10 mg by mouth at bedtime as needed. For insomnia       Scheduled:   . chlorhexidine  15 mL Mouth Rinse BID  . darifenacin  7.5 mg Oral Daily  . finasteride  5 mg Oral Daily  . insulin aspart  0-9 Units Subcutaneous TID WC  . insulin glargine  40 Units Subcutaneous BID  . olmesartan  40 mg Oral Daily  . pantoprazole  40 mg Oral Daily  . pregabalin  50 mg Oral BID  . simvastatin  20 mg Oral q1800   Continuous:   . 0.9 % NaCl with KCl 20 mEq / L 10 mL/hr (12/15/11 1156)   HYQ:MVHQIONG, ondansetron  Assessment/Plan:  1) umbilical hernia with partial obstruction:  Hernia is uncomfortable, patient tolerating fairly well.  Obstruction partial with normal meals and two BM's yesterday.  Plan is for surgical repair early next week.  Continue to hold anticoagulation in preparation for surgery. 2) Diabetes mellitus: Continue lantus with sliding scale.  Good control. 3) leucocytosis: likely secondary to #1. Trending down. 4) acute on chronic renal insufficiency: cr trending down.   LOS: 3 days   Jared Hall 12/16/2011, 7:23 AM

## 2011-12-16 NOTE — Progress Notes (Signed)
  Subjective: Pt c/o a lot of pain at hernia site. HOH, limited mobility, uses aide at home and power chair to get around, lives alone.  ON diabetic diet.   Objective: Vital signs in last 24 hours: Temp:  [98.1 F (36.7 C)-98.7 F (37.1 C)] 98.7 F (37.1 C) (01/25 2135) Pulse Rate:  [77-86] 77  (01/25 2135) Resp:  [18-20] 18  (01/25 2135) BP: (135-156)/(54-55) 156/55 mmHg (01/25 2135) SpO2:  [94 %-100 %] 94 % (01/25 2135) Last BM Date: 12/13/11  Intake/Output this shift:    Physical Exam: BP 156/55  Pulse 77  Temp(Src) 98.7 F (37.1 C) (Oral)  Resp 18  Ht 5\' 11"  (1.803 m)  Wt 113.9 kg (251 lb 1.7 oz)  BMI 35.02 kg/m2  SpO2 94% Abdomen: obese. Umbilical hernia soft, reducible, no erythema but it is tender to manipulate. Chest Clear, LE: no edema good pulses Labs: CBC  Basename 12/14/11 0520 12/13/11 1820  WBC 10.9* 13.8*  HGB 11.3* 12.7*  HCT 35.4* 39.6  PLT 193 200   BMET  Basename 12/14/11 0520 12/13/11 1820  NA 141 140  K 3.6 3.4*  CL 105 102  CO2 28 26  GLUCOSE 79 101*  BUN 21 25*  CREATININE 1.53* 1.87*  CALCIUM 9.0 9.7   LFT  Basename 12/14/11 0520  PROT 6.3  ALBUMIN 3.1*  AST 15  ALT 13  ALKPHOS 57  BILITOT 0.5  BILIDIR --  IBILI --  LIPASE --   PT/INR  Basename 12/14/11 0520 12/13/11 1837  LABPROT 14.6 13.9  INR 1.12 1.05   ABG No results found for this basename: PHART:2,PCO2:2,PO2:2,HCO3:2 in the last 72 hours  Studies/Results: No results found.  Assessment: Umbilical Hernia with ongoing pain. Patient Active Problem List  Diagnoses  . Umbilical hernia with obstruction-partial on CT scan; easily reducible  . History of PSVT (paroxysmal supraventricular tachycardia)  . DM (diabetes mellitus)  . Renal insufficiency  . History of CVA (cerebrovascular accident)  . Dyslipidemia  . Obesity (BMI 30-39.9)  Limited mobility at home with an Aide and power chair.   HOH   Plan: Continue to hold ASA and Plavix. Given continued  pain, may need to keep pt here and plan for hernia repair early next week, once off Plavix for minimum 5 days.Add IS,  Ask OT/PT to see. He live alone, he may have allot of difficulty returning home after surgery.     LOS: 3 days    Nadea Kirkland PA-C 12/16/2011

## 2011-12-16 NOTE — Evaluation (Signed)
Physical Therapy Evaluation Patient Details Name: Jared Hall MRN: [id number] DOB: 01/10/1931 Today's Date: 12/16/2011  Problem List:  Patient Active Problem List  Diagnoses  . Umbilical hernia with obstruction-partial on CT scan; easily reducible  . History of PSVT (paroxysmal supraventricular tachycardia)  . DM (diabetes mellitus)  . Renal insufficiency  . History of CVA (cerebrovascular accident)  . Dyslipidemia  . Obesity (BMI 30-39.9)    Past Medical History:  Past Medical History  Diagnosis Date  . Umbilical hernia   . Diabetes mellitus   . Cancer    Past Surgical History: History reviewed. No pertinent past surgical history.  PT Assessment/Plan/Recommendation PT Assessment Clinical Impression Statement: Patient is a 78 yo male admitted with partial SBO and incarcerated umbilical hernia.  Patient for surgery possibly Monday - will need re-order to reassess patient post-op.  Patient with limited mobility prior to admission, using electric wheelchair and ambulating short distances.  Patient has HH aide for assist with ADL's, homemaking, meals.  Patient will need to progress to modified independent level with transfers post-op to be able to return home.  Recommend HHPT at this time for continued therapy at discharge.  Will need re-order to reassess post-op for any additional needs. PT Recommendation/Assessment: Patient will need skilled PT in the acute care venue PT Problem List: Decreased strength;Decreased activity tolerance;Decreased mobility;Decreased coordination;Pain PT Therapy Diagnosis : Difficulty walking;Generalized weakness;Acute pain PT Plan PT Frequency: Min 3X/week PT Treatment/Interventions: DME instruction;Gait training;Functional mobility training;Balance training;Patient/family education PT Recommendation Follow Up Recommendations: Home health PT;Supervision - Intermittent (Continue HH aide) Equipment Recommended: None recommended by PT PT Goals  Acute Rehab  PT Goals PT Goal Formulation: With patient Time For Goal Achievement: 7 days Pt will go Supine/Side to Sit: with modified independence;with rail PT Goal: Supine/Side to Sit - Progress: Goal set today Pt will go Sit to Supine/Side: with modified independence;with rail PT Goal: Sit to Supine/Side - Progress: Goal set today Pt will go Sit to Stand: with modified independence;with upper extremity assist PT Goal: Sit to Stand - Progress: Goal set today Pt will go Stand to Sit: with modified independence;with upper extremity assist PT Goal: Stand to Sit - Progress: Goal set today Pt will Transfer Bed to Chair/Chair to Bed: with modified independence PT Transfer Goal: Bed to Chair/Chair to Bed - Progress: Goal set today Pt will Ambulate: 1 - 15 feet;with modified independence;with rolling walker PT Goal: Ambulate - Progress: Goal set today  PT Evaluation Precautions/Restrictions  Precautions Precautions: Fall Required Braces or Orthoses: No Restrictions Weight Bearing Restrictions: No Prior Functioning  Home Living Lives With: Alone Receives Help From: Home health;Family (Aide 3 days/week, 4 hours per day) Type of Home: House Home Layout: One level Home Access: Ramped entrance Bathroom Shower/Tub: Health visitor: Standard Home Adaptive Equipment: Wheelchair - powered;Crutches;Hospital bed;Shower chair with back;Bedside commode/3-in-1;Walker - rolling Prior Function Level of Independence: Needs assistance with ADLs;Needs assistance with homemaking;Independent with transfers;Requires assistive device for independence Driving: Yes Vocation: Retired Comments: Patient primarily uses Passenger transport manager.  Does walk short distances within house (6-8 feet inside bathroom). Cognition Cognition Arousal/Alertness: Awake/alert Overall Cognitive Status: Appears within functional limits for tasks assessed Orientation Level: Oriented X4 Sensation/Coordination Coordination Gross  Motor Movements are Fluid and Coordinated: No Coordination and Movement Description: Movements uncoordinated in LE's Extremity Assessment RUE Assessment RUE Assessment: Within Functional Limits LUE Assessment LUE Assessment: Within Functional Limits RLE Assessment RLE Assessment:  (Grossly 4-/5) LLE Assessment LLE Assessment:  (Grossly 4-/5) Mobility (including Balance) Bed  Mobility Bed Mobility: Yes Supine to Sit: 6: Modified independent (Device/Increase time);With rails;HOB elevated (Comment degrees) (HOB at 40 degrees) Sitting - Scoot to Edge of Bed: 6: Modified independent (Device/Increase time);With rail Transfers Transfers: Yes Sit to Stand: 4: Min assist;From bed;With upper extremity assist Sit to Stand Details (indicate cue type and reason): Assist for safety/balance.  Cues for safe hand placement. Stand to Sit: 5: Supervision;With upper extremity assist;To bed Stand to Sit Details: Cues for safety and hand placement Ambulation/Gait Ambulation/Gait: Yes Ambulation/Gait Assistance: 4: Min assist Ambulation/Gait Assistance Details (indicate cue type and reason): Decreased balance - decreased ankle strategy, using hip strategy.  Keeps knees extended during steps (2 steps forward and 2 steps backward) Ambulation Distance (Feet): 4 Feet Assistive device: Rolling walker Gait Pattern: Step-to pattern;Decreased stride length;Decreased dorsiflexion - right;Decreased dorsiflexion - left;Shuffle (Knees in extension during gait) Gait velocity: Slow gait speed  Balance Balance Assessed: No Exercise    End of Session PT - End of Session Equipment Utilized During Treatment: Gait belt Activity Tolerance: Patient limited by fatigue;Patient limited by pain Patient left: in bed;with call bell in reach (Sitting on edge of bed) Nurse Communication: Mobility status for transfers General Behavior During Session: [facility] for tasks performed Cognition: [facility] for tasks performed  Vena Austria  [phone] 12/16/2011, 8:46 PM

## 2011-12-16 NOTE — Progress Notes (Signed)
I agree with Jared Hall night.   Patient is stable and alert. Insight  fair. States he is having bowel movements and tolerating diet. Denies abdominal pain.  Abdomen is obese. Slightly distended. Soft. Nontender. Large umbilical hernia sac a little tender, could not completely reduced, but soft.  Assuming he. does not reobstruct, would think we could proceed with umbilical hernia repair on Monday.   Angelia Mould. Derrell Lolling, M.D., [facility] General and Minimally invasive Surgery Breast and Colorectal Surgery Office:   [phone] Pager:   [phone]

## 2011-12-17 LAB — HEMOGLOBIN A1C: Hgb A1c MFr Bld: 8.7 % — ABNORMAL HIGH (ref <5.7)

## 2011-12-17 LAB — BASIC METABOLIC PANEL
BUN: 12 mg/dL (ref 6–23)
CO2: 28 mEq/L (ref 19–32)
Chloride: 104 mEq/L (ref 96–112)
GFR calc Af Amer: 61 mL/min — ABNORMAL LOW (ref >90)
Glucose, Bld: 75 mg/dL (ref 70–99)
Potassium: 4.1 mEq/L (ref 3.5–5.1)

## 2011-12-17 LAB — SURGICAL PCR SCREEN
MRSA, PCR: NEGATIVE
Staphylococcus aureus: NEGATIVE

## 2011-12-17 LAB — GLUCOSE, CAPILLARY
Glucose-Capillary: 75 mg/dL (ref 70–99)
Glucose-Capillary: 105 mg/dL — ABNORMAL HIGH (ref 70–99)
Glucose-Capillary: 94 mg/dL (ref 70–99)

## 2011-12-17 LAB — CBC
HCT: 36.7 % — ABNORMAL LOW (ref 39.0–52.0)
Hemoglobin: 11.4 g/dL — ABNORMAL LOW (ref 13.0–17.0)
MCV: 69.2 fL — ABNORMAL LOW (ref 78.0–100.0)
RBC: 5.3 MIL/uL (ref 4.22–5.81)
RDW: 14.2 % (ref 11.5–15.5)
WBC: 9.7 10*3/uL (ref 4.0–10.5)

## 2011-12-17 MED ORDER — POLYETHYLENE GLYCOL 3350 17 G PO PACK
17.0000 g | PACK | Freq: Once | ORAL | Status: AC
  Administered 2011-12-17: 17 g via ORAL
  Filled 2011-12-17: qty 1

## 2011-12-17 MED ORDER — CEFAZOLIN SODIUM-DEXTROSE 2-3 GM-% IV SOLR
2.0000 g | INTRAVENOUS | Status: AC
  Administered 2011-12-18: 2 g via INTRAVENOUS
  Filled 2011-12-17 (×2): qty 50

## 2011-12-17 MED ORDER — CHLORHEXIDINE GLUCONATE 4 % EX LIQD
1.0000 "application " | Freq: Once | CUTANEOUS | Status: DC
  Filled 2011-12-17: qty 15

## 2011-12-17 NOTE — Progress Notes (Signed)
Subjective: Uncomfortable, has just received morphine for pain, resting in bed, not moving.  Objective: Vital signs in last 24 hours: Temp:  [98.4 F (36.9 C)-98.9 F (37.2 C)] 98.4 F (36.9 C) (01/27 0618) Pulse Rate:  [89-95] 95  (01/27 0618) Resp:  [18] 18  (01/27 0618) BP: (128-132)/(51-60) 132/60 mmHg (01/27 0618) SpO2:  [90 %-91 %] 91 % (01/27 0618) Weight change:  Last BM Date: 12/16/11  Intake/Output from previous day: 01/26 0701 - 01/27 0700 In: 1170 [P.O.:1080; I.V.:90] Out: 400 [Urine:400] Intake/Output this shift: Total I/O In: -  Out: 150 [Urine:150]  General appearance: alert, moderate distress and morbidly obese Cardio: regular rate and rhythm, S1, S2 normal, no murmur, click, rub or gallop GI: soft, tender to palpation at all sites, but mostly over hernia, brace is in position Extremities: extremities normal, atraumatic, no cyanosis or edema  Lab Results:  [facility] 12/17/11 0635  WBC 9.7  HGB 11.4*  HCT 36.7*  PLT 206   BMET  Basename 12/17/11 0635  NA 141  K 4.1  CL 104  CO2 28  GLUCOSE 75  BUN 12  CREATININE 1.26  CALCIUM 9.2    Studies/Results: No results found.  Medications: I have reviewed the patient's current medications.  Assessment/Plan: 1) umbilical hernia with partial obstruction: Hernia is uncomfortable, tolerating less well than yesterday. Obstruction partial with normal meals and passing flatus. Plan is for surgical repair early next week, hopefully tomorrow. Continue to hold anticoagulation in preparation for surgery.  2) Diabetes mellitus: Continue lantus with sliding scale. Good control. cbgs in normal range. 3) acute on chronic renal insufficiency: resolved. cr trending down.   LOS: 4 days   Thad Osoria 12/17/2011, 11:54 AM

## 2011-12-17 NOTE — Progress Notes (Addendum)
Subjective: Creatinine is better.  Lying on his side with the Hernia out and binder abound his buttocks.  Complaining of pain.  I was able to reduce it and with nurses help get binder back up around the hernia. Objective: Vital signs in last 24 hours: Temp:  [98.4 F (36.9 C)-98.9 F (37.2 C)] 98.4 F (36.9 C) (01/27 0618) Pulse Rate:  [89-95] 95  (01/27 0618) Resp:  [18] 18  (01/27 0618) BP: (128-132)/(51-60) 132/60 mmHg (01/27 0618) SpO2:  [90 %-91 %] 91 % (01/27 0618) Last BM Date: 12/16/11  Intake/Output this shift:    Physical Exam: BP 132/60  Pulse 95  Temp(Src) 98.4 F (36.9 C) (Oral)  Resp 18  Ht 5\' 11"  (1.803 m)  Wt 113.9 kg (251 lb 1.7 oz)  BMI 35.02 kg/m2  SpO2 91% Abdomen: obese. Umbilical hernia soft tender, and full of small bowel, reducible, no erythema but it is tender to manipulate. abd distended.  +BS, +flatus,+BM. Chest Clear, LE: no edema good pulses Labs: CBC  Basename 12/17/11 0635  WBC 9.7  HGB 11.4*  HCT 36.7*  PLT 206   BMET  Basename 12/17/11 0635  NA 141  K 4.1  CL 104  CO2 28  GLUCOSE 75  BUN 12  CREATININE 1.26  CALCIUM 9.2   LFT No results found for this basename: PROT,ALBUMIN,AST,ALT,ALKPHOS,BILITOT,BILIDIR,IBILI,LIPASE in the last 72 hours PT/INR No results found for this basename: LABPROT:2,INR:2 in the last 72 hours ABG No results found for this basename: PHART:2,PCO2:2,PO2:2,HCO3:2 in the last 72 hours  Studies/Results: No results found.  Assessment: Umbilical Hernia with ongoing pain. Patient Active Problem List  Diagnoses  . Umbilical hernia with obstruction-partial on CT scan; easily reducible  . History of PSVT (paroxysmal supraventricular tachycardia)  . DM (diabetes mellitus)  . Renal insufficiency  . History of CVA (cerebrovascular accident)  . Dyslipidemia  . Obesity (BMI 30-39.9)  Limited mobility at home with an Aide and power chair.   HOH   Plan: Continue to hold ASA and Plavix. Given  continued pain, may need to keep pt here and plan for hernia repair early next week, once off Plavix for minimum 5 days.Add IS,  Possible surgery tomorrow.   LOS: 4 days    JENNINGS,WILLARD PA-C 12/17/2011  Addendum:  I have examined Mr. Kamath and I agree with Mr. Marlyne Beards' note above.  His hernia is painful but is still reducible. He is tolerating his diet very well.   He has been off Plavix for 5 days. I have proposed to him that we take him to the operating room tomorrow for laparoscopic repair of his ventral hernia with mesh. I told him that we may have to convert this to an open procedure if the adhesions are bad. I told him that we will need to resect skin and umbilicus and he will be left without an umbilicus.  I discussed the indications and details of surgery with him. Risks and complications have been outlined, including but not limited to bleeding, infection, conversion to open laparotomy, recurrence of the hernia in the future, injury to the intestine with major surgery for repair, cardiac, pulmonary, and thromboembolic problems, skin healing problems, and other unforseen problems.  He understands these issues. His questions were answered. He agrees with this plan.  We will give him a dose of MiraLAX as a bowel prep today.  Angelia Mould. Derrell Lolling, M.D., [facility] General and Minimally invasive Surgery Breast and Colorectal Surgery Office:   [phone] Pager:  [phone]  12/17/2011 9:09 AM

## 2011-12-18 ENCOUNTER — Encounter (HOSPITAL_COMMUNITY): Payer: Self-pay | Admitting: Anesthesiology

## 2011-12-18 ENCOUNTER — Encounter (HOSPITAL_COMMUNITY): Admission: EM | Disposition: A | Discharge status: Home Health | Payer: Self-pay | Source: Home / Self Care

## 2011-12-18 ENCOUNTER — Other Ambulatory Visit (INDEPENDENT_AMBULATORY_CARE_PROVIDER_SITE_OTHER): Payer: Self-pay | Admitting: General Surgery

## 2011-12-18 ENCOUNTER — Inpatient Hospital Stay (HOSPITAL_COMMUNITY): Payer: Medicare Other | Admitting: Anesthesiology

## 2011-12-18 HISTORY — PX: VENTRAL HERNIA REPAIR: SHX424

## 2011-12-18 LAB — CBC
Hemoglobin: 11.3 g/dL — ABNORMAL LOW (ref 13.0–17.0)
MCH: 21.7 pg — ABNORMAL LOW (ref 26.0–34.0)
RBC: 5.2 MIL/uL (ref 4.22–5.81)

## 2011-12-18 LAB — GLUCOSE, CAPILLARY
Glucose-Capillary: 60 mg/dL — ABNORMAL LOW (ref 70–99)
Glucose-Capillary: 82 mg/dL (ref 70–99)
Glucose-Capillary: 93 mg/dL (ref 70–99)
Glucose-Capillary: 60 mg/dL — ABNORMAL LOW (ref 70–99)
Glucose-Capillary: 70 mg/dL (ref 70–99)
Glucose-Capillary: 99 mg/dL (ref 70–99)

## 2011-12-18 LAB — CREATININE, SERUM
Creatinine, Ser: 1.06 mg/dL (ref 0.50–1.35)
GFR calc Af Amer: 76 mL/min — ABNORMAL LOW (ref >90)

## 2011-12-18 SURGERY — REPAIR, HERNIA, VENTRAL
Anesthesia: General | Site: Abdomen | Wound class: Clean

## 2011-12-18 MED ORDER — DEXTROSE 50 % IV SOLN
INTRAVENOUS | Status: DC | PRN
  Administered 2011-12-18: 10 g via INTRAVENOUS

## 2011-12-18 MED ORDER — SODIUM CHLORIDE 0.9 % IJ SOLN
INTRAMUSCULAR | Status: DC | PRN
  Administered 2011-12-18: 20 mL

## 2011-12-18 MED ORDER — MORPHINE SULFATE 2 MG/ML IJ SOLN: 2.0000 mg | INTRAMUSCULAR | Status: DC | PRN

## 2011-12-18 MED ORDER — ONDANSETRON HCL 4 MG/2ML IJ SOLN: 4.0000 mg | Freq: Four times a day (QID) | INTRAMUSCULAR | Status: DC | PRN

## 2011-12-18 MED ORDER — NEOSTIGMINE METHYLSULFATE 1 MG/ML IJ SOLN
INTRAMUSCULAR | Status: DC | PRN
  Administered 2011-12-18: 5 mg via INTRAVENOUS

## 2011-12-18 MED ORDER — BUPIVACAINE LIPOSOME 1.3 % IJ SUSP
20.0000 mL | INTRAMUSCULAR | Status: AC
  Administered 2011-12-18: 20 mL
  Filled 2011-12-18: qty 20

## 2011-12-18 MED ORDER — ROCURONIUM BROMIDE 100 MG/10ML IV SOLN
INTRAVENOUS | Status: DC | PRN
  Administered 2011-12-18: 50 mg via INTRAVENOUS

## 2011-12-18 MED ORDER — POTASSIUM CHLORIDE IN NACL 20-0.9 MEQ/L-% IV SOLN
INTRAVENOUS | Status: DC
  Administered 2011-12-18 – 2011-12-21 (×4): via INTRAVENOUS
  Filled 2011-12-18 (×4): qty 1000

## 2011-12-18 MED ORDER — POTASSIUM CHLORIDE IN NACL 20-0.9 MEQ/L-% IV SOLN: INTRAVENOUS | Status: DC

## 2011-12-18 MED ORDER — CEFAZOLIN SODIUM 1-5 GM-% IV SOLN
INTRAVENOUS | Status: DC | PRN
  Administered 2011-12-18: 2 g via INTRAVENOUS

## 2011-12-18 MED ORDER — CEFAZOLIN SODIUM-DEXTROSE 2-3 GM-% IV SOLR
2.0000 g | Freq: Three times a day (TID) | INTRAVENOUS | Status: AC
  Administered 2011-12-18 – 2011-12-19 (×3): 2 g via INTRAVENOUS
  Filled 2011-12-18 (×3): qty 50

## 2011-12-18 MED ORDER — HYDROCODONE-ACETAMINOPHEN 5-325 MG PO TABS
1.0000 | ORAL_TABLET | ORAL | Status: DC | PRN
  Administered 2011-12-19 – 2011-12-21 (×12): 2 via ORAL
  Filled 2011-12-18 (×12): qty 2

## 2011-12-18 MED ORDER — ONDANSETRON HCL 4 MG/2ML IJ SOLN
4.0000 mg | Freq: Four times a day (QID) | INTRAMUSCULAR | Status: DC | PRN
  Administered 2011-12-20: 4 mg via INTRAVENOUS

## 2011-12-18 MED ORDER — ONDANSETRON HCL 4 MG PO TABS: 4.0000 mg | ORAL_TABLET | Freq: Four times a day (QID) | ORAL | Status: DC | PRN

## 2011-12-18 MED ORDER — HYDROMORPHONE HCL PF 1 MG/ML IJ SOLN
0.2500 mg | INTRAMUSCULAR | Status: DC | PRN
  Administered 2011-12-18 (×2): 0.5 mg via INTRAVENOUS

## 2011-12-18 MED ORDER — PROPOFOL 10 MG/ML IV EMUL
INTRAVENOUS | Status: DC | PRN
  Administered 2011-12-18: 180 mg via INTRAVENOUS

## 2011-12-18 MED ORDER — HEPARIN SODIUM (PORCINE) 5000 UNIT/ML IJ SOLN
5000.0000 [IU] | Freq: Three times a day (TID) | INTRAMUSCULAR | Status: DC
  Administered 2011-12-19 – 2011-12-22 (×10): 5000 [IU] via SUBCUTANEOUS
  Filled 2011-12-18 (×13): qty 1

## 2011-12-18 MED ORDER — SUCCINYLCHOLINE CHLORIDE 20 MG/ML IJ SOLN
INTRAMUSCULAR | Status: DC | PRN
  Administered 2011-12-18: 100 mg via INTRAVENOUS

## 2011-12-18 MED ORDER — HYDROMORPHONE HCL PF 1 MG/ML IJ SOLN
0.2500 mg | INTRAMUSCULAR | Status: DC | PRN
  Administered 2011-12-18: 0.5 mg via INTRAVENOUS

## 2011-12-18 MED ORDER — HYDROMORPHONE HCL PF 1 MG/ML IJ SOLN
INTRAMUSCULAR | Status: AC
  Filled 2011-12-18: qty 1

## 2011-12-18 MED ORDER — FENTANYL CITRATE 0.05 MG/ML IJ SOLN
INTRAMUSCULAR | Status: DC | PRN
  Administered 2011-12-18: 50 ug via INTRAVENOUS
  Administered 2011-12-18: 100 ug via INTRAVENOUS

## 2011-12-18 MED ORDER — LACTATED RINGERS IV SOLN
INTRAVENOUS | Status: DC | PRN
  Administered 2011-12-18 (×2): via INTRAVENOUS

## 2011-12-18 MED ORDER — 0.9 % SODIUM CHLORIDE (POUR BTL) OPTIME
TOPICAL | Status: DC | PRN
  Administered 2011-12-18: 1000 mL

## 2011-12-18 MED ORDER — GLYCOPYRROLATE 0.2 MG/ML IJ SOLN
INTRAMUSCULAR | Status: DC | PRN
  Administered 2011-12-18: .8 mg via INTRAVENOUS

## 2011-12-18 SURGICAL SUPPLY — 66 items
ADH SKN CLS APL DERMABOND .7 (GAUZE/BANDAGES/DRESSINGS)
APL SKNCLS STERI-STRIP NONHPOA (GAUZE/BANDAGES/DRESSINGS)
APPLIER CLIP LOGIC TI 5 (MISCELLANEOUS) IMPLANT
APPLIER CLIP ROT 10 11.4 M/L (STAPLE)
APR CLP MED LRG 11.4X10 (STAPLE)
APR CLP MED LRG 33X5 (MISCELLANEOUS)
BANDAGE GAUZE ELAST BULKY 4 IN (GAUZE/BANDAGES/DRESSINGS) ×1 IMPLANT
BENZOIN TINCTURE PRP APPL 2/3 (GAUZE/BANDAGES/DRESSINGS) IMPLANT
BINDER ABD UNIV 10 28-50 (GAUZE/BANDAGES/DRESSINGS) ×1 IMPLANT
BINDER ABDOM UNIV 10 (GAUZE/BANDAGES/DRESSINGS) ×3
BLADE SURG 10 STRL SS (BLADE) ×2 IMPLANT
BLADE SURG ROTATE 9660 (MISCELLANEOUS) ×2 IMPLANT
CANISTER SUCTION 2500CC (MISCELLANEOUS) IMPLANT
CHLORAPREP W/TINT 26ML (MISCELLANEOUS) ×3 IMPLANT
CLIP APPLIE ROT 10 11.4 M/L (STAPLE) IMPLANT
CLOTH BEACON ORANGE TIMEOUT ST (SAFETY) ×3 IMPLANT
COVER SURGICAL LIGHT HANDLE (MISCELLANEOUS) ×3 IMPLANT
DECANTER SPIKE VIAL GLASS SM (MISCELLANEOUS) ×1 IMPLANT
DERMABOND ADVANCED (GAUZE/BANDAGES/DRESSINGS)
DERMABOND ADVANCED .7 DNX12 (GAUZE/BANDAGES/DRESSINGS) ×1 IMPLANT
DEVICE SECURE STRAP 25 ABSORB (INSTRUMENTS) ×1 IMPLANT
DEVICE TROCAR PUNCTURE CLOSURE (ENDOMECHANICALS) ×1 IMPLANT
DRAPE INCISE IOBAN 66X45 STRL (DRAPES) ×1 IMPLANT
ELECT CAUTERY BLADE 6.4 (BLADE) ×2 IMPLANT
ELECT REM PT RETURN 9FT ADLT (ELECTROSURGICAL) ×3
ELECTRODE REM PT RTRN 9FT ADLT (ELECTROSURGICAL) ×2 IMPLANT
GAUZE SPONGE 4X4 12PLY STRL LF (GAUZE/BANDAGES/DRESSINGS) ×2 IMPLANT
GLOVE BIO SURGEON STRL SZ7 (GLOVE) ×1 IMPLANT
GLOVE BIOGEL PI IND STRL 7.0 (GLOVE) ×1 IMPLANT
GLOVE BIOGEL PI IND STRL 7.5 (GLOVE) ×1 IMPLANT
GLOVE BIOGEL PI INDICATOR 7.0 (GLOVE) ×1
GLOVE BIOGEL PI INDICATOR 7.5 (GLOVE) ×1
GLOVE ECLIPSE 7.5 STRL STRAW (GLOVE) ×2 IMPLANT
GLOVE EUDERMIC 7 POWDERFREE (GLOVE) ×2 IMPLANT
GLOVE SURG SS PI 6.5 STRL IVOR (GLOVE) ×4 IMPLANT
GOWN PREVENTION PLUS XLARGE (GOWN DISPOSABLE) ×2 IMPLANT
GOWN STRL NON-REIN LRG LVL3 (GOWN DISPOSABLE) ×9 IMPLANT
KIT BASIN OR (CUSTOM PROCEDURE TRAY) ×3 IMPLANT
KIT ROOM TURNOVER OR (KITS) ×3 IMPLANT
MESH PHYSIO OVAL 10X15CM (Mesh General) ×2 IMPLANT
NDL SPNL 22GX3.5 QUINCKE BK (NEEDLE) ×1 IMPLANT
NEEDLE 22X1 1/2 (OR ONLY) (NEEDLE) ×2 IMPLANT
NEEDLE SPNL 22GX3.5 QUINCKE BK (NEEDLE) IMPLANT
NS IRRIG 1000ML POUR BTL (IV SOLUTION) ×3 IMPLANT
PAD ARMBOARD 7.5X6 YLW CONV (MISCELLANEOUS) ×6 IMPLANT
PEN SKIN MARKING BROAD (MISCELLANEOUS) ×3 IMPLANT
PENCIL BUTTON HOLSTER BLD 10FT (ELECTRODE) ×2 IMPLANT
SCALPEL HARMONIC ACE (MISCELLANEOUS) IMPLANT
SCISSORS LAP 5X35 DISP (ENDOMECHANICALS) IMPLANT
SET IRRIG TUBING LAPAROSCOPIC (IRRIGATION / IRRIGATOR) IMPLANT
SLEEVE ENDOPATH XCEL 5M (ENDOMECHANICALS) ×2 IMPLANT
STAPLER VISISTAT 35W (STAPLE) ×2 IMPLANT
SUT MNCRL AB 4-0 PS2 18 (SUTURE) ×4 IMPLANT
SUT NOVA NAB DX-16 0-1 5-0 T12 (SUTURE) ×10 IMPLANT
SUT VIC AB 3-0 SH 18 (SUTURE) ×2 IMPLANT
TAPE CLOTH SURG 4X10 WHT LF (GAUZE/BANDAGES/DRESSINGS) ×2 IMPLANT
TOWEL OR 17X24 6PK STRL BLUE (TOWEL DISPOSABLE) ×3 IMPLANT
TOWEL OR 17X26 10 PK STRL BLUE (TOWEL DISPOSABLE) ×3 IMPLANT
TRAY FOLEY CATH 14FRSI W/METER (CATHETERS) ×3 IMPLANT
TRAY LAPAROSCOPIC (CUSTOM PROCEDURE TRAY) ×3 IMPLANT
TROCAR XCEL BLUNT TIP 100MML (ENDOMECHANICALS) ×1 IMPLANT
TROCAR XCEL NON-BLD 11X100MML (ENDOMECHANICALS) ×1 IMPLANT
TROCAR XCEL NON-BLD 5MMX100MML (ENDOMECHANICALS) ×3 IMPLANT
TUBE CONNECTING 12X1/4 (SUCTIONS) ×2 IMPLANT
WATER STERILE IRR 1000ML POUR (IV SOLUTION) IMPLANT
YANKAUER SUCT BULB TIP NO VENT (SUCTIONS) ×2 IMPLANT

## 2011-12-18 NOTE — Progress Notes (Signed)
PT CANCELLATION NOTE  Pt. Headed down for surgery this morning. Will need re-order after surgery to continue with therapy.  Thanks 12/18/2011 Fredrich Birks PTA [phone] pager [phone] office

## 2011-12-18 NOTE — Preoperative (Signed)
Beta Blockers   Reason not to administer Beta Blockers:Not Applicable 

## 2011-12-18 NOTE — Op Note (Addendum)
Patient Name:           Jared Hall   Date of Surgery:        12/18/2011  Pre op Diagnosis:      Incarcerated ventral hernia  Post op Diagnosis:    Incarcerated ventral hernia with partial small bowel junction  Procedure:                 Repair of incarcerated ventral hernia with mesh, debride 15 cm X 10 cm area of skin and subcutaneous tissue.  Surgeon:                     Angelia Mould. Derrell Lolling, M.D., FACS  Assistant:                      none  Operative Indications:   This is a 78 year old Caucasian male with cardiac disease, history of PSVT, history of CVA, history of renal insuffiency,  and diabetes and obesity. He presented to the hospital 5 days ago with a partial small bowel obstruction secondary to incarcerated ventral hernia. He was found to have a softball-sized ventral hernia protrusion in the central abdomen. This had essentially destroyed  the umbilicus. The hernia contained small bowel and there was a partial small bowel obstruction.    The hernia was reduced in the emergency room and he felt better. We had to reduce this 2 or 3 times since then. We have hospitalized him and taking him off of his Plavix and aspirin. He is brought to thr operating room now after being off of his Plavix for 5 days.  Operative Findings:       The patient had a defect in the fascia centrally which was about 8 cm transversely by about 5 cm vertically. There was a loop of small bowel which was densely and chronically incarcerated in the hernia which required fairly careful tedious dissection to dissect it out. Once the small bowel was dissected out of the hernia the small bowel was free. It was not grossly obstructed at this point in time. The patient had almost no omentum to bring down. There were no other defects palpable.  Procedure in Detail:          Following the induction of general endotracheal anesthesia the patient's abdomen was prepped and draped in a sterile fashion. Surgical time out was performed.  Intravenous antibiotics were given.  At the end of the case we injected a 50% solution of Exparel Into the muscle layers and subcutaneous tissue.  The patient had a partially incarcerated hernia and I was able to reduce most of the small bowel back into the abdomen. He had a very large redundant amount of skin which I had planned to resect. A transverse elliptical incision excised all the skin subcutaneous tissue. I dissected down and entered the hernia sac without any difficulty and debrided the hernia sac. This was all sent to the lab. There was a loop of small bowel chronically scarred into the upper rim of the hernia defect. This was taken down with sharp scissors and reduced and returned the abdominal cavity. I inspected and palpated throughout the abdominal cavity and felt no other abnormalities.  I brought a 15 cm x 10 cm piece of PhysioMesh up to the field. I had to trim this down a little bit in size. This was used to repair the hernia as an inlay graft. I placed about ten mattress sutures of #1 Novofil.  These were passed under direct vision down through the fascia, through the mesh and then back up through the fascia. These are placed carefully so as to not leave any gaps in the mesh. Once all these sutures were placed we slowly tied all of the sutures and very carefully made sure that there was no incarcerated bowel.  We could feel all the way around the defect and there was no gaps. We then tied all the sutures and we were very satisfied with this. We irrigated the wound. The fascia was then closed transversely over the top of the mesh with interrupted sutures of #1 Novofil. Subcutaneous tissue was closed with interrupted sutures of #1 of 3-0 Vicryl and the skin closed with skin staples. A dry bandage and a Velcro binder was placed. Patient tolerated procedure well. No complications. Counts correct. EBL 25 cc.     Angelia Mould. Derrell Lolling, M.D., FACS General and Minimally Invasive Surgery Breast  and Colorectal Surgery  12/18/2011 10:46 AM

## 2011-12-18 NOTE — Progress Notes (Signed)
Pt received from PACU alert and oriented but sleepy. VSS. Rn re-introduced self and did assessment. Rn put call bell within reach and placed patient on bed alarm.

## 2011-12-18 NOTE — Anesthesia Preprocedure Evaluation (Addendum)
Anesthesia Evaluation  Patient identified by MRN, date of birth, ID band Patient awake    Reviewed: Allergy & Precautions, H&P , NPO status , Patient's Chart, lab work & pertinent test results  Airway Mallampati: II  Neck ROM: full    Dental   Pulmonary          Cardiovascular hypertension, Pt. on medications     Neuro/Psych Right side leg weakness residual form cva  On blood thinners CVA, Residual Symptoms    GI/Hepatic GERD-  Medicated,  Endo/Other  Diabetes mellitus-Morbid obesityAM blood sugar in holding room - 72  Renal/GU      Musculoskeletal   Abdominal   Peds  Hematology   Anesthesia Other Findings   Reproductive/Obstetrics                         Anesthesia Physical Anesthesia Plan  ASA: II  Anesthesia Plan: General   Post-op Pain Management:    Induction: Intravenous  Airway Management Planned: Oral ETT  Additional Equipment:   Intra-op Plan:   Post-operative Plan: Extubation in OR  Informed Consent: I have reviewed the patients History and Physical, chart, labs and discussed the procedure including the risks, benefits and alternatives for the proposed anesthesia with the patient or authorized representative who has indicated his/her understanding and acceptance.   Dental advisory given  Plan Discussed with: CRNA, Surgeon and Anesthesiologist  Anesthesia Plan Comments:        Anesthesia Quick Evaluation

## 2011-12-18 NOTE — Progress Notes (Signed)
Subjective: Resting in chair.  Has bathed and binder is off of abdomen.  Uncomfortable.  Hoping for surgery today.  Objective: Vital signs in last 24 hours: Temp:  [97.5 F (36.4 C)-99 F (37.2 C)] 97.7 F (36.5 C) (01/28 0623) Pulse Rate:  [84-97] 97  (01/28 0623) Resp:  [18] 18  (01/28 0623) BP: (94-150)/(50-68) 94/50 mmHg (01/28 0623) SpO2:  [92 %-94 %] 92 % (01/28 4540) Weight change:  Last BM Date: 12/17/11  Intake/Output from previous day: 01/27 0701 - 01/28 0700 In: 720 [P.O.:720] Out: 550 [Urine:550] Intake/Output this shift:    General appearance: alert, cooperative and mild distress Resp: clear to auscultation bilaterally and shallow resps Cardio: regular rate and rhythm, S1, S2 normal, no murmur, click, rub or gallop GI: BS +, soft, tender to palpation at all sites, hernia is soft very tender Extremities: extremities normal, atraumatic, no cyanosis or edema  Lab Results:  [facility] 12/17/11 0635  WBC 9.7  HGB 11.4*  HCT 36.7*  PLT 206   BMET  Basename 12/17/11 0635  NA 141  K 4.1  CL 104  CO2 28  GLUCOSE 75  BUN 12  CREATININE 1.26  CALCIUM 9.2    Studies/Results: No results found.  Medications: I have reviewed the patient's current medications.  Assessment/Plan: 1) umbilical hernia with partial obstruction: Hernia is uncomfortable. Has not eaten this am in preparation for surgery.  Colon prep was ordered, but he denies any BM's overnight.  Hoping to have surgery today.  Continues off of anticoagulation.  2) Diabetes mellitus: Continue lantus with sliding scale. Good control. cbgs in normal range.  3) acute on chronic renal insufficiency: resolved. Cr has been trending down. No results this am.   LOS: 5 days   Brittain Smithey 12/18/2011, 7:06 AM

## 2011-12-18 NOTE — Anesthesia Postprocedure Evaluation (Signed)
  Anesthesia Post-op Note  Patient: Jared Hall  Procedure(s) Performed:  HERNIA REPAIR VENTRAL ADULT  Patient Location: PACU  Anesthesia Type: General  Level of Consciousness: awake and alert   Airway and Oxygen Therapy: Patient Spontanous Breathing  Post-op Pain: mild  Post-op Assessment: Post-op Vital signs reviewed, Patient's Cardiovascular Status Stable, Respiratory Function Stable, Patent Airway, No signs of Nausea or vomiting and Pain level controlled  Post-op Vital Signs: stable  Complications: No apparent anesthesia complications

## 2011-12-18 NOTE — Anesthesia Procedure Notes (Signed)
Procedure Name: Intubation Date/Time: 12/18/2011 9:43 AM Performed by: Glendora Score Pre-anesthesia Checklist: Patient identified, Emergency Drugs available, Suction available and Patient being monitored Patient Re-evaluated:Patient Re-evaluated prior to inductionOxygen Delivery Method: Circle System Utilized Preoxygenation: Pre-oxygenation with 100% oxygen Intubation Type: IV induction Ventilation: Mask ventilation without difficulty Laryngoscope Size: Miller and 3 Grade View: Grade I Tube type: Oral Tube size: 7.5 mm Number of attempts: 1 Airway Equipment and Method: stylet and LTA Placement Confirmation: ETT inserted through vocal cords under direct vision,  positive ETCO2 and breath sounds checked- equal and bilateral Secured at: 23 cm Tube secured with: Tape Dental Injury: Teeth and Oropharynx as per pre-operative assessment

## 2011-12-18 NOTE — Progress Notes (Signed)
Blood sugar taken at 1545 CBG: 60  Treatment: orange juice  Symptoms: sleepy  Follow-up CBG: Time:1600 CBG Result:70  Possible Reasons for Event: inadequate meal intake  Comments/MD notified:     Terance Hart

## 2011-12-18 NOTE — Transfer of Care (Signed)
Immediate Anesthesia Transfer of Care Note  Patient: Jared Hall  Procedure(s) Performed:  HERNIA REPAIR VENTRAL ADULT  Patient Location: PACU  Anesthesia Type: General  Level of Consciousness: awake and patient cooperative  Airway & Oxygen Therapy: Patient Spontanous Breathing and Patient connected to face mask oxygen  Post-op Assessment: Report given to PACU RN  Post vital signs: Reviewed and stable  Complications: No apparent anesthesia complications

## 2011-12-18 NOTE — Progress Notes (Signed)
OT Note  OT order received. Pt underwent surgery today. Please reorder OT in order to complete eval. Thanks [facility], OTR/L  [phone] 12/18/2011

## 2011-12-19 ENCOUNTER — Encounter (HOSPITAL_COMMUNITY): Payer: Self-pay | Admitting: General Surgery

## 2011-12-19 LAB — GLUCOSE, CAPILLARY
Glucose-Capillary: 65 mg/dL — ABNORMAL LOW (ref 70–99)
Glucose-Capillary: 82 mg/dL (ref 70–99)
Glucose-Capillary: 89 mg/dL (ref 70–99)

## 2011-12-19 LAB — BASIC METABOLIC PANEL
BUN: 10 mg/dL (ref 6–23)
Calcium: 8.6 mg/dL (ref 8.4–10.5)
Creatinine, Ser: 1.07 mg/dL (ref 0.50–1.35)
GFR calc Af Amer: 75 mL/min — ABNORMAL LOW (ref >90)
GFR calc non Af Amer: 64 mL/min — ABNORMAL LOW (ref >90)
Potassium: 3.5 mEq/L (ref 3.5–5.1)

## 2011-12-19 LAB — CBC
Hemoglobin: 10.7 g/dL — ABNORMAL LOW (ref 13.0–17.0)
MCHC: 31.5 g/dL (ref 30.0–36.0)
Platelets: 264 10*3/uL (ref 150–400)
RDW: 14.2 % (ref 11.5–15.5)

## 2011-12-19 MED ORDER — POLYETHYLENE GLYCOL 3350 17 G PO PACK
17.0000 g | PACK | Freq: Once | ORAL | Status: AC
  Administered 2011-12-19: 17 g via ORAL
  Filled 2011-12-19: qty 1

## 2011-12-19 MED ORDER — INSULIN GLARGINE 100 UNIT/ML ~~LOC~~ SOLN
20.0000 [IU] | Freq: Two times a day (BID) | SUBCUTANEOUS | Status: DC
  Administered 2011-12-19 – 2011-12-20 (×3): 20 [IU] via SUBCUTANEOUS
  Filled 2011-12-19: qty 3

## 2011-12-19 NOTE — Progress Notes (Signed)
MD notified about hypoglycemic event and Lantus dose.  Order received.  Will continue to monitor.  Hector Shade [place]

## 2011-12-19 NOTE — Progress Notes (Signed)
PT Note  New order received after surgery.  Pt up to chair earlier today and doesn't feel ready for therapy right now.  Ask [place] to come back in AM.  Will re-eval tomorrow AM.  Harrison Mons [facility] PT [phone]

## 2011-12-19 NOTE — Progress Notes (Signed)
CBG: 58    Treatment: 15 GM carbohydrate snack  Symptoms: None  Follow-up CBG: Time: 0818 CBG Result: 51  Treatment: 15 GM carbohydrate snack  Follow-up CBG: Time: 0835  CBG Result: 63    Treatment: Pt now eating breakfast tray  Follow-up CBG: Time: 0856  CBG Result: 65  Treatment: Pt still eating breakfast  Follow-up CBG: Time: 0917  CBG Result: 65    Treatment: 15 GM carbohydrate snack  Follow-up CBG: Time: 1011  CBG Result: 74     Possible Reasons for Event: Inadequate meal intake  Comments/MD notified: Will continue to monitor     Kincaid, Sharyon Medicus

## 2011-12-19 NOTE — Progress Notes (Signed)
Inpatient Diabetes Program Recommendations  AACE/ADA: New Consensus Statement on Inpatient Glycemic Control (2009)  Target Ranges:  Prepandial:   less than 140 mg/dL      Peak postprandial:   less than 180 mg/dL (1-2 hours)      Critically ill patients:  140 - 180 mg/dL   Reason for Visit: Hypoglycemia  Inpatient Diabetes Program Recommendations Insulin - Basal: Current Lantus dose is 40 units twice daily.  Needs reduction in dosage.  CBG's under target.  Note:  Results for Jared Hall, Jared Hall (MRN [id number]) as of 12/19/2011 09:14  Ref. Range 12/18/2011 10:43 12/18/2011 11:12 12/18/2011 15:42 12/18/2011 15:56 12/18/2011 17:07 12/18/2011 22:18 12/19/2011 08:03 12/19/2011 08:18 12/19/2011 08:35  Glucose-Capillary Latest Range: 70-99 mg/dL 93 82 60 (L) 70 70 99 58 (L) 51 (L) 63 (L)   Nurse to contact MD to address Lantus.

## 2011-12-19 NOTE — Progress Notes (Signed)
Patient ID: TENNESSEE PERRA, male   DOB: 1931/08/10, 78 y.o.   MRN: [id number] 1 Day Post-Op  Subjective: Pt feels ok.  C/o some soreness.  No nausea.  Some flatus.  Ambulated to the bathroom.  Objective: Vital signs in last 24 hours: Temp:  [97.3 F (36.3 C)-99.1 F (37.3 C)] 99.1 F (37.3 C) (01/29 0548) Pulse Rate:  [75-92] 85  (01/29 0548) Resp:  [15-20] 20  (01/29 0548) BP: (109-150)/(62-110) 124/65 mmHg (01/29 0548) SpO2:  [91 %-99 %] 97 % (01/29 0548) Last BM Date: 12/17/11  Intake/Output from previous day: 01/28 0701 - 01/29 0700 In: 2665.8 [P.O.:60; I.V.:2455.8; IV Piggyback:150] Out: 400 [Urine:400] Intake/Output this shift:    PE: Abd: soft, tender, few BS, ND, obese, incision c/d/i and covered with gauze and tape.  Abdominal binder in place.  Lab Results:   Basename 12/19/11 0500 12/18/11 1524  WBC 11.3* 10.9*  HGB 10.7* 11.3*  HCT 34.0* 36.0*  PLT 264 238   BMET  Basename 12/19/11 0500 12/18/11 1524 12/17/11 0635  NA 140 -- 141  K 3.5 -- 4.1  CL 103 -- 104  CO2 29 -- 28  GLUCOSE 64* -- 75  BUN 10 -- 12  CREATININE 1.07 1.06 --  CALCIUM 8.6 -- 9.2   PT/INR No results found for this basename: LABPROT:2,INR:2 in the last 72 hours   Studies/Results: No results found.  Anti-infectives: Anti-infectives     Start     Dose/Rate Route Frequency Ordered Stop   12/18/11 1600   ceFAZolin (ANCEF) IVPB 2 g/50 mL premix        2 g 100 mL/hr over 30 Minutes Intravenous 3 times per day 12/18/11 1341 12/19/11 0543   12/17/11 0931   ceFAZolin (ANCEF) IVPB 2 g/50 mL premix        2 g 100 mL/hr over 30 Minutes Intravenous 60 min pre-op 12/17/11 0931 12/18/11 0830           Assessment/Plan  1. S/p umbilical hernia repair with mesh 2. Deconditioning 3. DM 4. H/o CVA  Plan: 1. Patient does have some bowel activity, will allow clear liquids 2. PT/OT consult for mobilization 3. Resume coumadin and plavix today per IM team.   LOS: 6 days     Rainee Sweatt E 12/19/2011

## 2011-12-19 NOTE — Progress Notes (Signed)
Patient seen and examined. He is alert and stable.  Abdomen soft, obese, wound OK.  Plan: advance diet and activities.            Resume all home meds per [facility]. Will contact.   Angelia Mould. Derrell Lolling, M.D., [facility] General and Minimally invasive Surgery Breast and Colorectal Surgery Office:   [phone] Pager:   [phone]

## 2011-12-20 LAB — GLUCOSE, CAPILLARY
Glucose-Capillary: 71 mg/dL (ref 70–99)
Glucose-Capillary: 70 mg/dL (ref 70–99)
Glucose-Capillary: 85 mg/dL (ref 70–99)

## 2011-12-20 MED ORDER — CLOPIDOGREL BISULFATE 75 MG PO TABS
75.0000 mg | ORAL_TABLET | Freq: Every day | ORAL | Status: DC
  Administered 2011-12-21 – 2011-12-22 (×2): 75 mg via ORAL
  Filled 2011-12-20 (×3): qty 1

## 2011-12-20 MED ORDER — GLUCOSE 40 % PO GEL: 1.0000 | ORAL | Status: DC | PRN

## 2011-12-20 MED ORDER — DEXTROSE 50 % IV SOLN: 25.0000 mL | Freq: Once | INTRAVENOUS | Status: AC | PRN

## 2011-12-20 MED ORDER — GLUCOSE 40 % PO GEL
ORAL | Status: AC
  Administered 2011-12-20: 37.5 g
  Filled 2011-12-20: qty 1

## 2011-12-20 MED ORDER — GLUCOSE-VITAMIN C 4-6 GM-MG PO CHEW: 4.0000 | CHEWABLE_TABLET | ORAL | Status: DC | PRN

## 2011-12-20 NOTE — Progress Notes (Signed)
Physical Therapy Treatment Patient Details Name: Jared Hall MRN: [id number] DOB: 04/27/1931 Today's Date: 12/20/2011  PT Assessment/Plan  PT - Assessment/Plan Comments on Treatment Session: Pt reevaluated after repair of ventral hernia.  Pt making good progress with mobility and goals remain appropriate.  Pt's home environment adapted for him and he should be able to return home. PT Plan: Discharge plan remains appropriate PT Frequency: Min 3X/week Follow Up Recommendations: Home health PT;Supervision - Intermittent (continue HH aide) Equipment Recommended: None recommended by PT PT Goals  Acute Rehab PT Goals PT Goal: Sit to Stand - Progress: Progressing toward goal PT Goal: Stand to Sit - Progress: Progressing toward goal PT Transfer Goal: Bed to Chair/Chair to Bed - Progress: Progressing toward goal PT Goal: Ambulate - Progress: Progressing toward goal  PT Treatment Precautions/Restrictions  Precautions Precautions: Fall Required Braces or Orthoses: No Restrictions Weight Bearing Restrictions: No Mobility (including Balance) Transfers Sit to Stand: 5: Supervision;From chair/3-in-1;With upper extremity assist;With armrests Sit to Stand Details (indicate cue type and reason): No cues needed.   Stand to Sit: 5: Supervision;With upper extremity assist;To chair/3-in-1;With armrests Stand to Sit Details: No cues needed. Ambulation/Gait Ambulation/Gait Assistance: 5: Supervision Ambulation Distance (Feet): 120 Feet Assistive device: Rolling walker Gait Pattern: Trunk flexed;Decreased step length - left;Decreased step length - right (Knees in extension)  Static Standing Balance Static Standing - Balance Support: Bilateral upper extremity supported (on walker) Static Standing - Level of Assistance: 5: Stand by assistance Exercise    End of Session PT - End of Session Activity Tolerance: Patient tolerated treatment well Patient left: in chair;with call bell in reach Nurse  Communication: Mobility status for transfers;Mobility status for ambulation General Behavior During Session: [facility] for tasks performed Cognition: [facility] for tasks performed  [facility] 12/20/2011, 9:28 AM  [facility] PT [phone]

## 2011-12-20 NOTE — Progress Notes (Signed)
Patient ID: Jared Hall, male   DOB: April 26, 1931, 78 y.o.   MRN: [id number] 2 Days Post-Op  Subjective: Pt feels well.  Wants more than liquids.  + flatus  Objective: Vital signs in last 24 hours: Temp:  [98.2 F (36.8 C)-98.6 F (37 C)] 98.6 F (37 C) (01/30 0621) Pulse Rate:  [75-84] 83  (01/30 0621) Resp:  [17-20] 20  (01/30 0621) BP: (122-154)/(60-71) 154/71 mmHg (01/30 0621) SpO2:  [92 %-98 %] 92 % (01/30 0919) Last BM Date: 12/17/11  Intake/Output from previous day: 01/29 0701 - 01/30 0700 In: 1579.5 [P.O.:360; I.V.:1219.5] Out: 1850 [Urine:1850] Intake/Output this shift:    PE: Abd: soft, minimally tender, +BS, ND, obese, incision c/d/i  Lab Results:   Basename 12/19/11 0500 12/18/11 1524  WBC 11.3* 10.9*  HGB 10.7* 11.3*  HCT 34.0* 36.0*  PLT 264 238   BMET  Basename 12/19/11 0500 12/18/11 1524  NA 140 --  K 3.5 --  CL 103 --  CO2 29 --  GLUCOSE 64* --  BUN 10 --  CREATININE 1.07 1.06  CALCIUM 8.6 --   PT/INR No results found for this basename: LABPROT:2,INR:2 in the last 72 hours   Studies/Results: No results found.  Anti-infectives: Anti-infectives     Start     Dose/Rate Route Frequency Ordered Stop   12/18/11 1600   ceFAZolin (ANCEF) IVPB 2 g/50 mL premix        2 g 100 mL/hr over 30 Minutes Intravenous 3 times per day 12/18/11 1341 12/19/11 0543   12/17/11 0931   ceFAZolin (ANCEF) IVPB 2 g/50 mL premix        2 g 100 mL/hr over 30 Minutes Intravenous 60 min pre-op 12/17/11 0931 12/18/11 0830           Assessment/Plan  1. S/p umbilical hernia repair 2. On plavix, will resume today 3. DM 4. Deconditioning  Plan: 1. HHPT has been arranged 2. Advance diet today, anticipate d/c tomorrow am 3. D/w Dr. Kevan Ny, he will adjust Lantus today 4. Will resume Plavix today as well.   LOS: 7 days    Shalina Norfolk E 12/20/2011

## 2011-12-20 NOTE — Progress Notes (Signed)
Occupational Therapy Evaluation Patient Details Name: Jared Hall MRN: [id number] DOB: [birth year] Today's Date: 12/20/2011  Problem List:  Patient Active Problem List  Diagnoses  . Umbilical hernia with obstruction-partial on CT scan; easily reducible  . History of PSVT (paroxysmal supraventricular tachycardia)  . DM (diabetes mellitus)  . Renal insufficiency  . History of CVA (cerebrovascular accident)  . Dyslipidemia  . Obesity (BMI 30-39.9)    Past Medical History:  Past Medical History  Diagnosis Date  . Umbilical hernia   . Diabetes mellitus   . Cancer    Past Surgical History:  Past Surgical History  Procedure Date  . Ventral hernia repair 12/18/2011    Procedure: HERNIA REPAIR VENTRAL ADULT;  Surgeon: Ernestene Mention, MD;  Location: [facility] OR;  Service: General;  Laterality: N/A;    OT Assessment/Plan/Recommendation OT Assessment Clinical Impression Statement: 78 yo male s/p hernia repair. Pt required A for ADL and IADL tasks PTA and received services (?cap worker?) to assist with those needs. Pt will have necessary help after D/C. Pt given reacher to assist with ADL. No further acute OT needs. All future needs can be addressed by HHOT. OT Recommendation/Assessment: All further OT needs can be met in the next venue of care OT Recommendation Follow Up Recommendations: Home health OT Equipment Recommended: None recommended by OT Individuals Consulted Consulted and Agree with Results and Recommendations: Patient OT Goals Acute Rehab OT Goals OT Goal Formulation:  (eval only)  OT Evaluation Precautions/Restrictions  Precautions Precautions: Fall Required Braces or Orthoses: No Restrictions Weight Bearing Restrictions: No Prior Functioning Home Living Lives With: Alone Receives Help From: Home health;Family;Personal care attendant (Attendant comes every other day to help with ADL and IADL) Type of Home: House Home Layout: One level Home Access: Ramped  entrance Bathroom Shower/Tub: Health visitor: Standard Bathroom Accessibility: Yes How Accessible: Accessible via walker Home Adaptive Equipment: Wheelchair - powered;Crutches;Hospital bed;Shower chair with back;Bedside commode/3-in-1;Walker - rolling Prior Function Level of Independence: Needs assistance with ADLs;Needs assistance with homemaking;Independent with transfers;Requires assistive device for independence Driving: Yes Vocation: Retired ADL ADL Eating/Feeding: Not assessed Grooming: Simulated;Independent Upper Body Bathing: Simulated;Set up Where Assessed - Upper Body Bathing: Sitting, bed Lower Body Bathing: Moderate assistance (demonstrated use of long handled sponge to increase indep) Where Assessed - Lower Body Bathing: Sitting, bed Upper Body Dressing: Simulated;Moderate assistance (due to decreased B shoulder ROM) Where Assessed - Upper Body Dressing: Sitting, bed Lower Body Dressing: Moderate assistance Lower Body Dressing Details (indicate cue type and reason):  (demonstrated use of reacher, sock aid and shoehorn) Where Assessed - Lower Body Dressing: Sitting, bed Toilet Transfer: Not assessed (Pt stated he did not want to get up and walk) Equipment Used: Reacher;Long-handled sponge;Long-handled shoe horn;Sock aid Ambulation Related to ADLs: NA. Per PT, pt ambulating with S ADL Comments: REquired A PTA. Will resume services after D/C. discussed use of AE and energy conservation techniques to increase indep with ADL. Vision/Perception    Cognition Cognition Arousal/Alertness: Awake/alert Overall Cognitive Status: Appears within functional limits for tasks assessed Orientation Level: Oriented X4 Sensation/Coordination Sensation Light Touch: Appears Intact Proprioception: Appears Intact Coordination Gross Motor Movements are Fluid and Coordinated: Yes Fine Motor Movements are Fluid and Coordinated: Yes Coordination and Movement Description:  decreased functional use BUE Extremity Assessment RUE Assessment RUE Assessment: Exceptions to [facility] (@ 30 shoulder flexion/abduction) LUE Assessment LUE Assessment: Exceptions to [facility] (limited shoulder ROM. premorbid) Mobility  Bed Mobility Bed Mobility: Yes Supine to Sit: 6: Modified independent (Device/Increase  time);With rails;HOB elevated (Comment degrees) Sitting - Scoot to Edge of Bed: 6: Modified independent (Device/Increase time);With rail Transfers Transfers: No Exercises   End of Session OT - End of Session Activity Tolerance: Patient limited by pain Patient left: in bed;with call bell in reach General Behavior During Session: [facility] for tasks performed Cognition: [facility] for tasks performed   [facility] 12/20/2011, 4:26 PM  [facility], OTR/L  [phone] 12/20/2011

## 2011-12-20 NOTE — Progress Notes (Signed)
The patient is seen and examined. I agree with Ms. Earl Gala is note.  Plavix to be restarted and insulin to be adjusted.  He looks pretty good and his abdominal wound looks fine. I appreciate all of the medical input from [place] [facility] [place].  Hopefully we can advance his diet and activities and he can be discharged in one to 2 days.   Angelia Mould. Derrell Lolling, M.D., [facility] General and Minimally invasive Surgery Breast and Colorectal Surgery Office:   [phone] Pager:   [phone]

## 2011-12-20 NOTE — Progress Notes (Signed)
HH agency choice offered to patient. He reports that he used [facility] in the past and wants to stay with them.  HHPT order entered with Face to Face form sent to MD. Address and phone number in the system is correct for patient. He has no DME needs.

## 2011-12-21 LAB — GLUCOSE, CAPILLARY
Glucose-Capillary: 73 mg/dL (ref 70–99)
Glucose-Capillary: 68 mg/dL — ABNORMAL LOW (ref 70–99)
Glucose-Capillary: 68 mg/dL — ABNORMAL LOW (ref 70–99)
Glucose-Capillary: 134 mg/dL — ABNORMAL HIGH (ref 70–99)
Glucose-Capillary: 98 mg/dL (ref 70–99)

## 2011-12-21 LAB — CBC
Hemoglobin: 10.9 g/dL — ABNORMAL LOW (ref 13.0–17.0)
Platelets: 269 10*3/uL (ref 150–400)
RBC: 5.1 MIL/uL (ref 4.22–5.81)
WBC: 9.5 10*3/uL (ref 4.0–10.5)

## 2011-12-21 LAB — DIFFERENTIAL
Basophils Relative: 0 % (ref 0–1)
Eosinophils Relative: 3 % (ref 0–5)
Lymphocytes Relative: 10 % — ABNORMAL LOW (ref 12–46)
Monocytes Absolute: 1.4 10*3/uL — ABNORMAL HIGH (ref 0.1–1.0)
Monocytes Relative: 15 % — ABNORMAL HIGH (ref 3–12)
Neutrophils Relative %: 72 % (ref 43–77)

## 2011-12-21 LAB — BASIC METABOLIC PANEL
CO2: 28 mEq/L (ref 19–32)
Calcium: 8.9 mg/dL (ref 8.4–10.5)
Glucose, Bld: 69 mg/dL — ABNORMAL LOW (ref 70–99)
Potassium: 4 mEq/L (ref 3.5–5.1)
Sodium: 142 mEq/L (ref 135–145)

## 2011-12-21 MED ORDER — INSULIN GLARGINE 100 UNIT/ML ~~LOC~~ SOLN
20.0000 [IU] | Freq: Every day | SUBCUTANEOUS | Status: DC
  Administered 2011-12-21: 20 [IU] via SUBCUTANEOUS

## 2011-12-21 MED ORDER — DEXTROSE 50 % IV SOLN
INTRAVENOUS | Status: AC
  Administered 2011-12-21: 19:00:00
  Filled 2011-12-21: qty 50

## 2011-12-21 NOTE — Progress Notes (Signed)
CBG: 58  Treatment: D50 IV 25 mL  Symptoms: None  Follow-up CBG: Time:1900 CBG Result:134  Possible Reasons for Event: Inadequate meal intake  Comments/MD notified:no    Arbutus Leas A

## 2011-12-21 NOTE — Progress Notes (Signed)
CBG: 66  Treatment: 15 GM geldextrose gelSymptoms: None  Follow-up CBG: Time2250: CBG Result70:  Possible Reasons for Event: Inadequate meal intake  Comments/MD notified:none    Jared Hall, Jared Hall

## 2011-12-21 NOTE — Progress Notes (Addendum)
Room Air Sat @93 % down from 2l/m. Using IS. Room Air Sat @92 % after ambulating with PT.

## 2011-12-21 NOTE — Progress Notes (Addendum)
Subjective: Starting to eat well. CBGs still low. Abd pain is better  Objective: Weight change:   Intake/Output Summary (Last 24 hours) at 12/21/11 0820 Last data filed at 12/21/11 0700  Gross per 24 hour  Intake   2500 ml  Output    500 ml  Net   2000 ml   Filed Vitals:   12/20/11 0919 12/20/11 1430 12/20/11 2155 12/21/11 0625  BP:  113/92 137/74 119/57  Pulse:  90 103 87  Temp:  99 F (37.2 C) 98.4 F (36.9 C) 98.4 F (36.9 C)  TempSrc:  Oral    Resp:  20 20 18   Height:      Weight:      SpO2: 92%  91% 92%   Physical exam: Alert and oriented Chest clear Cardiac exam with regular rhythm Abdomen is obese soft and only minimally tender near the surgical site periumbilically. Dressing is dry Extremities without cyanosis or clubbing, trace edema Neurological, oriented x3   Lab Results:  Basename 12/19/11 0500 12/18/11 1524  NA 140 --  K 3.5 --  CL 103 --  CO2 29 --  GLUCOSE 64* --  BUN 10 --  CREATININE 1.07 1.06  CALCIUM 8.6 --  MG -- --  PHOS -- --   No results found for this basename: AST:2,ALT:2,ALKPHOS:2,BILITOT:2,PROT:2,ALBUMIN:2 in the last 72 hours No results found for this basename: LIPASE:2,AMYLASE:2 in the last 72 hours  Basename 12/19/11 0500 12/18/11 1524  WBC 11.3* 10.9*  NEUTROABS -- --  HGB 10.7* 11.3*  HCT 34.0* 36.0*  MCV 69.5* 69.2*  PLT 264 238   No results found for this basename: CKTOTAL:3,CKMB:3,CKMBINDEX:3,TROPONINI:3 in the last 72 hours No components found with this basename: POCBNP:3 No results found for this basename: DDIMER:2 in the last 72 hours No results found for this basename: HGBA1C:2 in the last 72 hours No results found for this basename: CHOL:2,HDL:2,LDLCALC:2,TRIG:2,CHOLHDL:2,LDLDIRECT:2 in the last 72 hours No results found for this basename: TSH,T4TOTAL,FREET3,T3FREE,THYROIDAB in the last 72 hours No results found for this basename: VITAMINB12:2,FOLATE:2,FERRITIN:2,TIBC:2,IRON:2,RETICCTPCT:2 in the last 72  hours  Studies/Results: No results found. Medications: Scheduled Meds:   . clopidogrel  75 mg Oral Q breakfast  . darifenacin  7.5 mg Oral Daily  . dextrose      . finasteride  5 mg Oral Daily  . heparin  5,000 Units Subcutaneous Q8H  . insulin aspart  0-9 Units Subcutaneous TID WC  . insulin glargine  20 Units Subcutaneous BID  . olmesartan  40 mg Oral Daily  . pantoprazole  40 mg Oral Daily  . pregabalin  50 mg Oral BID  . simvastatin  20 mg Oral q1800   Continuous Infusions:   . 0.9 % NaCl with KCl 20 mEq / L 50 mL/hr at 12/21/11 0605   PRN Meds:.dextrose, dextrose, glucose-Vitamin C, HYDROcodone-acetaminophen, HYDROmorphone, morphine, ondansetron, ondansetron (ZOFRAN) IV, ondansetron (ZOFRAN) IV, ondansetron  Assessment/Plan: Patient Active Problem List  Diagnoses Date Noted  . Renal insufficiency - stable  12/16/2011  . History of CVA (cerebrovascular accident) - Plavix resumed  12/16/2011  . Dyslipidemia - aware  12/16/2011  . Obesity (BMI 30-39.9) 12/16/2011  . Umbilical hernia - repaired. Should be ready for discharge home in a.m.  12/14/2011  . History of PSVT (paroxysmal supraventricular tachycardia) - controlled  12/14/2011  . DM (diabetes mellitus) - Lantus has been reduced. Plans for nursing followup at home and nutritional education today Plans are for discharge home in a.m., 12/22/2011 with home health physical therapy occupational therapy  and nursing  12/14/2011     LOS: 8 days   Jared Hall 12/21/2011, 8:20 AM

## 2011-12-21 NOTE — Progress Notes (Signed)
Agree with dc from acute PT.

## 2011-12-21 NOTE — Progress Notes (Signed)
Patient ID: Jared Hall, male   DOB: 10-22-1931, 78 y.o.   MRN: [id number] 3 Days Post-Op  Subjective: Pt without c/o.  Tolerating a regular diet.  Had one BM. Pain well controlled.  Objective: Vital signs in last 24 hours: Temp:  [98.4 F (36.9 C)-99 F (37.2 C)] 98.4 F (36.9 C) (01/31 0625) Pulse Rate:  [87-103] 87  (01/31 0625) Resp:  [18-20] 18  (01/31 0625) BP: (113-137)/(57-92) 119/57 mmHg (01/31 0625) SpO2:  [91 %-92 %] 92 % (01/31 0625) Last BM Date: 12/21/11  Intake/Output from previous day: 01/30 0701 - 01/31 0700 In: 2500 [P.O.:1200; I.V.:1300] Out: 500 [Urine:500] Intake/Output this shift:    PE: Abd: soft, minimally tender, incision c/d/i with staples.  +BS, ND, obese  Lab Results:   Basename 12/19/11 0500 12/18/11 1524  WBC 11.3* 10.9*  HGB 10.7* 11.3*  HCT 34.0* 36.0*  PLT 264 238   BMET  Basename 12/19/11 0500 12/18/11 1524  NA 140 --  K 3.5 --  CL 103 --  CO2 29 --  GLUCOSE 64* --  BUN 10 --  CREATININE 1.07 1.06  CALCIUM 8.6 --   PT/INR No results found for this basename: LABPROT:2,INR:2 in the last 72 hours   Studies/Results: No results found.  Anti-infectives: Anti-infectives     Start     Dose/Rate Route Frequency Ordered Stop   12/18/11 1600   ceFAZolin (ANCEF) IVPB 2 g/50 mL premix        2 g 100 mL/hr over 30 Minutes Intravenous 3 times per day 12/18/11 1341 12/19/11 0543   12/17/11 0931   ceFAZolin (ANCEF) IVPB 2 g/50 mL premix        2 g 100 mL/hr over 30 Minutes Intravenous 60 min pre-op 12/17/11 0931 12/18/11 0830           Assessment/Plan  1. S/p UHR 2. DM  Plan: 1. Patient is doing well surgically.  He is tolerating a regular diet well. 2. Appreciate Dr. Kevan Ny' assistance with management of the patient's medical problems. 3. Patient has HH already set up.  Anticipate d/c home tomorrow am.   LOS: 8 days    Jared Hall E 12/21/2011

## 2011-12-21 NOTE — Progress Notes (Signed)
CBG: 70  Treatment: 15 GM carbohydrate snack  Symptoms: None  Follow-up CBG: Time:2320 CBG Result:85  Possible Reasons for Event: Inadequate meal intake  Comments/MD notified:none    [place], Raylene Everts

## 2011-12-21 NOTE — Progress Notes (Signed)
Physical Therapy Treatment Patient Details Name: Jared Hall MRN: [id number] DOB: October 30, 1931 Today's Date: 12/21/2011  PT Assessment/Plan  PT - Assessment/Plan Comments on Treatment Session: Pt progressed well with therapy. Will sign off at this time. Patient has HHPT and aide set up for discharge.  PT Plan: All goals met and education completed, patient dischaged from PT services Follow Up Recommendations: Home health PT;Supervision - Intermittent;Other (comment) (continue aide) Equipment Recommended: None recommended by PT;None recommended by OT PT Goals  Acute Rehab PT Goals PT Goal: Sit to Stand - Progress: Met PT Goal: Stand to Sit - Progress: Met PT Transfer Goal: Bed to Chair/Chair to Bed - Progress: Met PT Goal: Ambulate - Progress: Met  PT Treatment Precautions/Restrictions  Precautions Precautions: Fall Required Braces or Orthoses: No Restrictions Weight Bearing Restrictions: No Mobility (including Balance) Transfers Sit to Stand: 6: Modified independent (Device/Increase time) Stand to Sit: 6: Modified independent (Device/Increase time) Ambulation/Gait Ambulation/Gait Assistance: 6: Modified independent (Device/Increase time) Ambulation Distance (Feet): 120 Feet Assistive device: Rolling walker Gait Pattern: Step-through pattern;Decreased stride length    Exercise  General Exercises - Lower Extremity Long Arc Quad: AROM;Both;10 reps Hip Flexion/Marching: AROM;Both;10 reps End of Session PT - End of Session Equipment Utilized During Treatment: Gait belt Activity Tolerance: Patient tolerated treatment well Patient left: in chair General Behavior During Session: [facility] for tasks performed Cognition: [facility] for tasks performed  [place], Adline Potter 12/21/2011, 2:02 PM 12/21/2011 Fredrich Birks PTA [phone] pager [phone] office

## 2011-12-21 NOTE — Progress Notes (Signed)
Nutrition Follow-up/Consult for Diet Education:  Diet Order:  Carb Mod Medium diet, 10-50% meal completion.  Patient states he is just not hungry and has mostly been taking liquids.  Meds: Scheduled Meds:   . clopidogrel  75 mg Oral Q breakfast  . darifenacin  7.5 mg Oral Daily  . dextrose      . finasteride  5 mg Oral Daily  . heparin  5,000 Units Subcutaneous Q8H  . insulin aspart  0-9 Units Subcutaneous TID WC  . insulin glargine  20 Units Subcutaneous Daily  . olmesartan  40 mg Oral Daily  . pantoprazole  40 mg Oral Daily  . pregabalin  50 mg Oral BID  . simvastatin  20 mg Oral q1800  . DISCONTD: insulin glargine  20 Units Subcutaneous BID   Continuous Infusions:   . DISCONTD: 0.9 % NaCl with KCl 20 mEq / L 50 mL/hr at 12/21/11 0605   PRN Meds:.dextrose, dextrose, glucose-Vitamin C, HYDROcodone-acetaminophen, ondansetron, ondansetron (ZOFRAN) IV, ondansetron (ZOFRAN) IV, ondansetron, DISCONTD: HYDROmorphone, DISCONTD: morphine  Labs:  CMP     Component Value Date/Time   NA 142 12/21/2011 0919   K 4.0 12/21/2011 0919   CL 106 12/21/2011 0919   CO2 28 12/21/2011 0919   GLUCOSE 69* 12/21/2011 0919   BUN 11 12/21/2011 0919   CREATININE 0.93 12/21/2011 0919   CALCIUM 8.9 12/21/2011 0919   PROT 6.3 12/14/2011 0520   ALBUMIN 3.1* 12/14/2011 0520   AST 15 12/14/2011 0520   ALT 13 12/14/2011 0520   ALKPHOS 57 12/14/2011 0520   BILITOT 0.5 12/14/2011 0520   GFRNONAA 78* 12/21/2011 0919   GFRAA >90 12/21/2011 0919   CBG (last 3)   Basename 12/21/11 1237 12/21/11 0931 12/21/11 0756  GLUCAP 91 68* 68*    Lab Results  Component Value Date   HGBA1C 8.7* 12/16/2011     Intake/Output Summary (Last 24 hours) at 12/21/11 1512 Last data filed at 12/21/11 1300  Gross per 24 hour  Intake   1570 ml  Output      0 ml  Net   1570 ml    Weight Status:  No new measurements available  Estimated needs:  2075-2275 kcal, 90-100 gm protein daily   Patient with frequent episodes of  hypoglycemia during this admission.  Intakes are likely inadequate, but insulin regimen has also been adjusted with reduction in basal insulin and addition of a bolus insulin correction scale.  Patient states he didn't follow a special diet prior to admission but was regularly eating breakfast and lunch, sometimes eating dinner, and usually eating a bedtime snack.  He said he always checks his blood sugar 2 or 3 times daily.  He is able to verbalize what to do if his blood sugar is low at home.  Nutrition Dx:  Inadequate oral intake related to decreased appetite post-operatively as evidenced by 10-50% meal completion and frequent hypoglycemic episodes.  Goal:  Po intake adequate to meet estimated needs for wound healing, not met  Intervention:    Brief education completed with patient including importance of consuming meals at regular intervals to prevent low BG as well as how carbohydrate affects blood glucose levels.  Printed materials provided along with contact information.  Patient verbalized limited understanding of concepts.   Otto Herb Pager #:  [phone]

## 2011-12-21 NOTE — Progress Notes (Signed)
Agree with assessment and care plan as outlined by Ms. Osborne. The patient interviewed and examined.  Care plan also discussed with Dr. Johnella Moloney. Hopefully discharge tomorrow.  Angelia Mould. Derrell Lolling, M.D., [facility] General and Minimally invasive Surgery Breast and Colorectal Surgery Office:   [phone] Pager:   [phone]

## 2011-12-21 NOTE — Progress Notes (Signed)
Room Air Sat.@87 %, up to 89% with deep breathing, 02 on @ 2l/m 91%. Using IS

## 2011-12-22 LAB — HEMOGLOBIN A1C
Hgb A1c MFr Bld: 8 % — ABNORMAL HIGH (ref <5.7)
Mean Plasma Glucose: 183 mg/dL — ABNORMAL HIGH (ref <117)

## 2011-12-22 LAB — GLUCOSE, CAPILLARY: Glucose-Capillary: 95 mg/dL (ref 70–99)

## 2011-12-22 LAB — TSH: TSH: 0.532 u[IU]/mL (ref 0.350–4.500)

## 2011-12-22 MED ORDER — INSULIN GLARGINE 100 UNIT/ML ~~LOC~~ SOLN: 20.0000 [IU] | Freq: Every day | SUBCUTANEOUS | Status: DC

## 2011-12-22 NOTE — Discharge Summary (Signed)
Physician Discharge Summary  NAME:Jared Hall  WUJ:[id number]  DOB: May 30, 1931   Admit date: 12/13/2011 Discharge date: 12/22/2011  Discharge Diagnoses:  Active Problems:  Umbilical hernia with obstruction-partial - resolved with surgical repair of umbilical hernia with mesh  History of PSVT (paroxysmal supraventricular tachycardia) - controlled  DM (diabetes mellitus) - episodes of hypoglycemia, Lantus has been reduced at discharge  Renal insufficiency - resolved. Discharge creatinine 0.93  History of CVA (cerebrovascular accident) - aware, stable  Dyslipidemia - cholesterol 99 and HDL 25 and LDL 49 on 12/16/2011  Obesity (BMI 30-39.9)  Depression - stable  Discharge Condition: Improved  Hospital Course:  Jared Hall is a pleasant 78 year old male with a history of type 2 diabetes mellitus, obesity, cerebrovascular disease and stroke, and chronic umbilical hernia who presented on 12/13/2011 with abdominal pain. CT scan showed partial small bowel obstruction emanating from the hernia. Because patient had been on aspirin and Plavix, surgery had to be delayed for 5 days for surgical repair. Patient underwent reduction of the umbilical hernia surgically with mesh repair per Dr. Claud Kelp on 12/18/2011. His recovery has been relatively uneventful except for episodes of hypoglycemia secondary to decreased by mouth intake. Patient is normally on Lantus 40 units subcutaneous twice a day at home but his dietary intake has been markedly different while hospitalized. He will be discharged on Lantus 20 units subcutaneous daily expecting dietary intake to definitely increased. He will also likely be less compliant at home with his diabetic diet which has been a chronic issue. He has been placed back on his Plavix for cerebrovascular disease and is now ready for discharge.   Consults: Treatment Team:  Pearla Dubonnet, MD  Filed Vitals:   12/21/11 9562 12/21/11 1400 12/21/11 2200 12/22/11 0555    BP: 131/62 155/75 125/68 147/63  Pulse: 82 85 81 80  Temp: 98.2 F (36.8 C) 98.2 F (36.8 C) 97.5 F (36.4 C) 97.9 F (36.6 C)  TempSrc: Oral Oral    Resp: 18 19 18 18   Height:      Weight:      SpO2: 97% 94% 93% 94%   Physical exam:  Alert and oriented  Chest clear  Cardiac exam with regular rhythm  Abdomen is obese soft and only minimally tender near the surgical site periumbilically. Dressing is dry. Bowel sounds are normal  Extremities without cyanosis or clubbing, trace edema  Neurological, oriented x3  Disposition:  Discharge to home in the company of family with followup in 4-5 days in the office   Medication List  As of 12/22/2011  7:31 AM   ASK your doctor about these medications         citalopram 40 MG tablet   Commonly known as: CELEXA   Take 20 mg by mouth daily.      clopidogrel 75 MG tablet   Commonly known as: PLAVIX   Take 75 mg by mouth daily.      finasteride 5 MG tablet   Commonly known as: PROSCAR   Take 5 mg by mouth daily.      HYDROcodone-acetaminophen 5-500 MG per tablet   Commonly known as: VICODIN   Take 1 tablet by mouth 3 (three) times daily.      insulin glargine 100 UNIT/ML injection   Commonly known as: LANTUS   Inject 40 Units into the skin 2 (two) times daily.      pantoprazole 40 MG tablet   Commonly known as: PROTONIX   Take 40  mg by mouth daily.      pregabalin 50 MG capsule   Commonly known as: LYRICA   Take 50 mg by mouth 2 (two) times daily.      simvastatin 20 MG tablet   Commonly known as: ZOCOR   Take 20 mg by mouth every evening.      solifenacin 5 MG tablet   Commonly known as: VESICARE   Take 5 mg by mouth daily.      valsartan 320 MG tablet   Commonly known as: DIOVAN   Take 160 mg by mouth daily.      vitamin B-12 1000 MCG tablet   Commonly known as: CYANOCOBALAMIN   Take 1,000 mcg by mouth daily.      warfarin 5 MG tablet   Commonly known as: COUMADIN   Take 2.5-5 mg by mouth daily. Takes 0.5  tablet (2.5 MG) every day except on Friday; on Fridays, take 1 tablet (5 MG)      zolpidem 10 MG tablet   Commonly known as: AMBIEN   Take 10 mg by mouth at bedtime as needed. For insomnia             Things to follow up in the outpatient setting: Abdominal pain/dietary intake/blood sugar control  Time coordinating discharge: 35 minutes  The results of significant diagnostics from this hospitalization (including imaging, microbiology, ancillary and laboratory) are listed below for reference.    Significant Diagnostic Studies: Ct Abdomen Pelvis Wo Contrast  12/13/2011  *RADIOLOGY REPORT*  Clinical Data: Severe abdominal pain for several days, history of prostate carcinoma  CT ABDOMEN AND PELVIS WITHOUT CONTRAST  Technique:  Multidetector CT imaging of the abdomen and pelvis was performed following the standard protocol without intravenous contrast.  Comparison: CT abdomen pelvis of 02/20/2007  Findings: The lung bases are clear.  The liver is unremarkable in the unenhanced state.  No calcified gallstones are seen.  The pancreas is fatty infiltrated and the pancreatic duct is not dilated.  The adrenal glands and spleen are unremarkable.  The stomach is moderately distended with contrast and food debris and is unremarkable.  A cyst is stable emanating from the lateral right kidney.  No hydronephrosis is seen and no renal calculi are noted. The abdominal aorta is normal in caliber with atheromatous change present.  No adenopathy is seen.  An umbilical hernia has enlarged somewhat containing only a slightly dilated loop of small bowel.  No mucosal edema is seen. However, the more proximal small bowel is dilated measuring up to 4 cms in diameter consistent with partial small bowel obstruction with the umbilical hernia causing the obstruction.  No fluid is seen within the pelvis.  The urinary bladder is not distended.  The prostate is normal in size with a few small prostate clips.  Degenerative disc  disease is noted diffusely throughout the lumbar spine.  IMPRESSION:    Small partial small bowel obstruction caused by an umbilical hernia.  No mucosal edema is seen.  I discussed the findings of the study with Dr. Kevan Ny at 4:25 p.m. on 12/13/2011  Original Report Authenticated By: Juline Patch, M.D.    Microbiology: Recent Results (from the past 240 hour(s))  SURGICAL PCR SCREEN     Status: Normal   Collection Time   12/17/11  2:40 PM      Component Value Range Status Comment   MRSA, PCR NEGATIVE  NEGATIVE  Final    Staphylococcus aureus NEGATIVE  NEGATIVE  Final  Labs: Results for orders placed during the hospital encounter of 12/13/11  CBC      Component Value Range   WBC 13.8 (*) 4.0 - 10.5 (K/uL)   RBC 5.81  4.22 - 5.81 (MIL/uL)   Hemoglobin 12.7 (*) 13.0 - 17.0 (g/dL)   HCT 16.1  09.6 - 04.5 (%)   MCV 68.2 (*) 78.0 - 100.0 (fL)   MCH 21.9 (*) 26.0 - 34.0 (pg)   MCHC 32.1  30.0 - 36.0 (g/dL)   RDW 40.9  81.1 - 91.4 (%)   Platelets 200  150 - 400 (K/uL)  DIFFERENTIAL      Component Value Range   Neutrophils Relative 78 (*) 43 - 77 (%)   Lymphocytes Relative 8 (*) 12 - 46 (%)   Monocytes Relative 12  3 - 12 (%)   Eosinophils Relative 2  0 - 5 (%)   Basophils Relative 0  0 - 1 (%)   Neutro Abs 10.7 (*) 1.7 - 7.7 (K/uL)   Lymphs Abs 1.1  0.7 - 4.0 (K/uL)   Monocytes Absolute 1.7 (*) 0.1 - 1.0 (K/uL)   Eosinophils Absolute 0.3  0.0 - 0.7 (K/uL)   Basophils Absolute 0.0  0.0 - 0.1 (K/uL)   RBC Morphology POLYCHROMASIA PRESENT     Smear Review LARGE PLATELETS PRESENT    BASIC METABOLIC PANEL      Component Value Range   Sodium 140  135 - 145 (mEq/L)   Potassium 3.4 (*) 3.5 - 5.1 (mEq/L)   Chloride 102  96 - 112 (mEq/L)   CO2 26  19 - 32 (mEq/L)   Glucose, Bld 101 (*) 70 - 99 (mg/dL)   BUN 25 (*) 6 - 23 (mg/dL)   Creatinine, Ser 7.82 (*) 0.50 - 1.35 (mg/dL)   Calcium 9.7  8.4 - 95.6 (mg/dL)   GFR calc non Af Amer 33 (*) >90 (mL/min)   GFR calc Af Amer 38 (*) >90  (mL/min)  LACTIC ACID, PLASMA      Component Value Range   Lactic Acid, Venous 2.0  0.5 - 2.2 (mmol/L)  PROTIME-INR      Component Value Range   Prothrombin Time 13.9  11.6 - 15.2 (seconds)   INR 1.05  0.00 - 1.49   COMPREHENSIVE METABOLIC PANEL      Component Value Range   Sodium 141  135 - 145 (mEq/L)   Potassium 3.6  3.5 - 5.1 (mEq/L)   Chloride 105  96 - 112 (mEq/L)   CO2 28  19 - 32 (mEq/L)   Glucose, Bld 79  70 - 99 (mg/dL)   BUN 21  6 - 23 (mg/dL)   Creatinine, Ser 2.13 (*) 0.50 - 1.35 (mg/dL)   Calcium 9.0  8.4 - 08.6 (mg/dL)   Total Protein 6.3  6.0 - 8.3 (g/dL)   Albumin 3.1 (*) 3.5 - 5.2 (g/dL)   AST 15  0 - 37 (U/L)   ALT 13  0 - 53 (U/L)   Alkaline Phosphatase 57  39 - 117 (U/L)   Total Bilirubin 0.5  0.3 - 1.2 (mg/dL)   GFR calc non Af Amer 42 (*) >90 (mL/min)   GFR calc Af Amer 48 (*) >90 (mL/min)  CBC      Component Value Range   WBC 10.9 (*) 4.0 - 10.5 (K/uL)   RBC 5.21  4.22 - 5.81 (MIL/uL)   Hemoglobin 11.3 (*) 13.0 - 17.0 (g/dL)   HCT 57.8 (*) 46.9 - 52.0 (%)  MCV 67.9 (*) 78.0 - 100.0 (fL)   MCH 21.7 (*) 26.0 - 34.0 (pg)   MCHC 31.9  30.0 - 36.0 (g/dL)   RDW 16.1  09.6 - 04.5 (%)   Platelets 193  150 - 400 (K/uL)  PROTIME-INR      Component Value Range   Prothrombin Time 14.6  11.6 - 15.2 (seconds)   INR 1.12  0.00 - 1.49   GLUCOSE, CAPILLARY      Component Value Range   Glucose-Capillary 69 (*) 70 - 99 (mg/dL)  GLUCOSE, CAPILLARY      Component Value Range   Glucose-Capillary 125 (*) 70 - 99 (mg/dL)  GLUCOSE, CAPILLARY      Component Value Range   Glucose-Capillary 83  70 - 99 (mg/dL)  GLUCOSE, CAPILLARY      Component Value Range   Glucose-Capillary 71  70 - 99 (mg/dL)   Comment 1 Notify RN    GLUCOSE, CAPILLARY      Component Value Range   Glucose-Capillary 114 (*) 70 - 99 (mg/dL)  GLUCOSE, CAPILLARY      Component Value Range   Glucose-Capillary 130 (*) 70 - 99 (mg/dL)  GLUCOSE, CAPILLARY      Component Value Range    Glucose-Capillary 159 (*) 70 - 99 (mg/dL)  GLUCOSE, CAPILLARY      Component Value Range   Glucose-Capillary 156 (*) 70 - 99 (mg/dL)  GLUCOSE, CAPILLARY      Component Value Range   Glucose-Capillary 155 (*) 70 - 99 (mg/dL)  GLUCOSE, CAPILLARY      Component Value Range   Glucose-Capillary 163 (*) 70 - 99 (mg/dL)   Comment 1 Notify RN    GLUCOSE, CAPILLARY      Component Value Range   Glucose-Capillary 155 (*) 70 - 99 (mg/dL)   Comment 1 Notify RN    GLUCOSE, CAPILLARY      Component Value Range   Glucose-Capillary 129 (*) 70 - 99 (mg/dL)  GLUCOSE, CAPILLARY      Component Value Range   Glucose-Capillary 141 (*) 70 - 99 (mg/dL)  HEMOGLOBIN W0J      Component Value Range   Hemoglobin A1C 8.7 (*) <5.7 (%)   Mean Plasma Glucose 203 (*) <117 (mg/dL)  MICROALBUMIN, URINE      Component Value Range   Microalb, Ur 0.68  0.00 - 1.89 (mg/dL)  LIPID PANEL      Component Value Range   Cholesterol 99  0 - 200 (mg/dL)   Triglycerides 811  <914 (mg/dL)   HDL 25 (*) >78 (mg/dL)   Total CHOL/HDL Ratio 4.0     VLDL 25  0 - 40 (mg/dL)   LDL Cholesterol 49  0 - 99 (mg/dL)  GLUCOSE, CAPILLARY      Component Value Range   Glucose-Capillary 93  70 - 99 (mg/dL)   Comment 1 Documented in Chart     Comment 2 Notify RN    GLUCOSE, CAPILLARY      Component Value Range   Glucose-Capillary 104 (*) 70 - 99 (mg/dL)   Comment 1 Documented in Chart     Comment 2 Notify RN    CBC      Component Value Range   WBC 9.7  4.0 - 10.5 (K/uL)   RBC 5.30  4.22 - 5.81 (MIL/uL)   Hemoglobin 11.4 (*) 13.0 - 17.0 (g/dL)   HCT 29.5 (*) 62.1 - 52.0 (%)   MCV 69.2 (*) 78.0 - 100.0 (fL)   MCH 21.5 (*)  26.0 - 34.0 (pg)   MCHC 31.1  30.0 - 36.0 (g/dL)   RDW 16.1  09.6 - 04.5 (%)   Platelets 206  150 - 400 (K/uL)  BASIC METABOLIC PANEL      Component Value Range   Sodium 141  135 - 145 (mEq/L)   Potassium 4.1  3.5 - 5.1 (mEq/L)   Chloride 104  96 - 112 (mEq/L)   CO2 28  19 - 32 (mEq/L)   Glucose, Bld 75  70  - 99 (mg/dL)   BUN 12  6 - 23 (mg/dL)   Creatinine, Ser 4.09  0.50 - 1.35 (mg/dL)   Calcium 9.2  8.4 - 81.1 (mg/dL)   GFR calc non Af Amer 53 (*) >90 (mL/min)   GFR calc Af Amer 61 (*) >90 (mL/min)  GLUCOSE, CAPILLARY      Component Value Range   Glucose-Capillary 106 (*) 70 - 99 (mg/dL)   Comment 1 Documented in Chart     Comment 2 Notify RN    GLUCOSE, CAPILLARY      Component Value Range   Glucose-Capillary 106 (*) 70 - 99 (mg/dL)  GLUCOSE, CAPILLARY      Component Value Range   Glucose-Capillary 75  70 - 99 (mg/dL)   Comment 1 Documented in Chart     Comment 2 Notify RN    GLUCOSE, CAPILLARY      Component Value Range   Glucose-Capillary 114 (*) 70 - 99 (mg/dL)   Comment 1 Documented in Chart     Comment 2 Notify RN    SURGICAL PCR SCREEN      Component Value Range   MRSA, PCR NEGATIVE  NEGATIVE    Staphylococcus aureus NEGATIVE  NEGATIVE   GLUCOSE, CAPILLARY      Component Value Range   Glucose-Capillary 105 (*) 70 - 99 (mg/dL)   Comment 1 Notify RN    GLUCOSE, CAPILLARY      Component Value Range   Glucose-Capillary 94  70 - 99 (mg/dL)  GLUCOSE, CAPILLARY      Component Value Range   Glucose-Capillary 68 (*) 70 - 99 (mg/dL)   Comment 1 Notify RN    GLUCOSE, CAPILLARY      Component Value Range   Glucose-Capillary 65 (*) 70 - 99 (mg/dL)   Comment 1 Repeat Test    GLUCOSE, CAPILLARY      Component Value Range   Glucose-Capillary 72  70 - 99 (mg/dL)  GLUCOSE, CAPILLARY      Component Value Range   Glucose-Capillary 82  70 - 99 (mg/dL)  GLUCOSE, CAPILLARY      Component Value Range   Glucose-Capillary 55 (*) 70 - 99 (mg/dL)  GLUCOSE, CAPILLARY      Component Value Range   Glucose-Capillary 60 (*) 70 - 99 (mg/dL)  GLUCOSE, CAPILLARY      Component Value Range   Glucose-Capillary 93  70 - 99 (mg/dL)  CBC      Component Value Range   WBC 10.9 (*) 4.0 - 10.5 (K/uL)   RBC 5.20  4.22 - 5.81 (MIL/uL)   Hemoglobin 11.3 (*) 13.0 - 17.0 (g/dL)   HCT 91.4 (*)  78.2 - 52.0 (%)   MCV 69.2 (*) 78.0 - 100.0 (fL)   MCH 21.7 (*) 26.0 - 34.0 (pg)   MCHC 31.4  30.0 - 36.0 (g/dL)   RDW 95.6  21.3 - 08.6 (%)   Platelets 238  150 - 400 (K/uL)  CREATININE, SERUM  Component Value Range   Creatinine, Ser 1.06  0.50 - 1.35 (mg/dL)   GFR calc non Af Amer 65 (*) >90 (mL/min)   GFR calc Af Amer 76 (*) >90 (mL/min)  GLUCOSE, CAPILLARY      Component Value Range   Glucose-Capillary 60 (*) 70 - 99 (mg/dL)   Comment 1 Notify RN    GLUCOSE, CAPILLARY      Component Value Range   Glucose-Capillary 70  70 - 99 (mg/dL)  BASIC METABOLIC PANEL      Component Value Range   Sodium 140  135 - 145 (mEq/L)   Potassium 3.5  3.5 - 5.1 (mEq/L)   Chloride 103  96 - 112 (mEq/L)   CO2 29  19 - 32 (mEq/L)   Glucose, Bld 64 (*) 70 - 99 (mg/dL)   BUN 10  6 - 23 (mg/dL)   Creatinine, Ser 0.98  0.50 - 1.35 (mg/dL)   Calcium 8.6  8.4 - 11.9 (mg/dL)   GFR calc non Af Amer 64 (*) >90 (mL/min)   GFR calc Af Amer 75 (*) >90 (mL/min)  CBC      Component Value Range   WBC 11.3 (*) 4.0 - 10.5 (K/uL)   RBC 4.89  4.22 - 5.81 (MIL/uL)   Hemoglobin 10.7 (*) 13.0 - 17.0 (g/dL)   HCT 14.7 (*) 82.9 - 52.0 (%)   MCV 69.5 (*) 78.0 - 100.0 (fL)   MCH 21.9 (*) 26.0 - 34.0 (pg)   MCHC 31.5  30.0 - 36.0 (g/dL)   RDW 56.2  13.0 - 86.5 (%)   Platelets 264  150 - 400 (K/uL)  GLUCOSE, CAPILLARY      Component Value Range   Glucose-Capillary 70  70 - 99 (mg/dL)  GLUCOSE, CAPILLARY      Component Value Range   Glucose-Capillary 99  70 - 99 (mg/dL)  GLUCOSE, CAPILLARY      Component Value Range   Glucose-Capillary 58 (*) 70 - 99 (mg/dL)   Comment 1 Notify RN    GLUCOSE, CAPILLARY      Component Value Range   Glucose-Capillary 51 (*) 70 - 99 (mg/dL)   Comment 1 Notify RN    GLUCOSE, CAPILLARY      Component Value Range   Glucose-Capillary 63 (*) 70 - 99 (mg/dL)   Comment 1 Notify RN    GLUCOSE, CAPILLARY      Component Value Range   Glucose-Capillary 82  70 - 99 (mg/dL)    GLUCOSE, CAPILLARY      Component Value Range   Glucose-Capillary 65 (*) 70 - 99 (mg/dL)   Comment 1 Documented in Chart     Comment 2 Notify RN     Comment 3 Repeat Test    GLUCOSE, CAPILLARY      Component Value Range   Glucose-Capillary 65 (*) 70 - 99 (mg/dL)   Comment 1 Notify RN    GLUCOSE, CAPILLARY      Component Value Range   Glucose-Capillary 74  70 - 99 (mg/dL)  GLUCOSE, CAPILLARY      Component Value Range   Glucose-Capillary 111 (*) 70 - 99 (mg/dL)   Comment 1 Notify RN    GLUCOSE, CAPILLARY      Component Value Range   Glucose-Capillary 89  70 - 99 (mg/dL)   Comment 1 Documented in Chart     Comment 2 Notify RN    GLUCOSE, CAPILLARY      Component Value Range   Glucose-Capillary 71  70 - 99 (mg/dL)   Comment 1 Notify RN    GLUCOSE, CAPILLARY      Component Value Range   Glucose-Capillary 71  70 - 99 (mg/dL)   Comment 1 Notify RN    GLUCOSE, CAPILLARY      Component Value Range   Glucose-Capillary 76  70 - 99 (mg/dL)   Comment 1 Notify RN    GLUCOSE, CAPILLARY      Component Value Range   Glucose-Capillary 66 (*) 70 - 99 (mg/dL)  GLUCOSE, CAPILLARY      Component Value Range   Glucose-Capillary 70  70 - 99 (mg/dL)  GLUCOSE, CAPILLARY      Component Value Range   Glucose-Capillary 85  70 - 99 (mg/dL)  GLUCOSE, CAPILLARY      Component Value Range   Glucose-Capillary 73  70 - 99 (mg/dL)  GLUCOSE, CAPILLARY      Component Value Range   Glucose-Capillary 68 (*) 70 - 99 (mg/dL)   Comment 1 Notify RN    CBC      Component Value Range   WBC 9.5  4.0 - 10.5 (K/uL)   RBC 5.10  4.22 - 5.81 (MIL/uL)   Hemoglobin 10.9 (*) 13.0 - 17.0 (g/dL)   HCT 46.9 (*) 62.9 - 52.0 (%)   MCV 69.8 (*) 78.0 - 100.0 (fL)   MCH 21.4 (*) 26.0 - 34.0 (pg)   MCHC 30.6  30.0 - 36.0 (g/dL)   RDW 52.8  41.3 - 24.4 (%)   Platelets 269  150 - 400 (K/uL)  DIFFERENTIAL      Component Value Range   Neutrophils Relative 72  43 - 77 (%)   Lymphocytes Relative 10 (*) 12 - 46 (%)    Monocytes Relative 15 (*) 3 - 12 (%)   Eosinophils Relative 3  0 - 5 (%)   Basophils Relative 0  0 - 1 (%)   Neutro Abs 6.8  1.7 - 7.7 (K/uL)   Lymphs Abs 1.0  0.7 - 4.0 (K/uL)   Monocytes Absolute 1.4 (*) 0.1 - 1.0 (K/uL)   Eosinophils Absolute 0.3  0.0 - 0.7 (K/uL)   Basophils Absolute 0.0  0.0 - 0.1 (K/uL)   RBC Morphology POLYCHROMASIA PRESENT    BASIC METABOLIC PANEL      Component Value Range   Sodium 142  135 - 145 (mEq/L)   Potassium 4.0  3.5 - 5.1 (mEq/L)   Chloride 106  96 - 112 (mEq/L)   CO2 28  19 - 32 (mEq/L)   Glucose, Bld 69 (*) 70 - 99 (mg/dL)   BUN 11  6 - 23 (mg/dL)   Creatinine, Ser 0.10  0.50 - 1.35 (mg/dL)   Calcium 8.9  8.4 - 27.2 (mg/dL)   GFR calc non Af Amer 78 (*) >90 (mL/min)   GFR calc Af Amer >90  >90 (mL/min)  HEMOGLOBIN A1C      Component Value Range   Hemoglobin A1C 8.0 (*) <5.7 (%)   Mean Plasma Glucose 183 (*) <117 (mg/dL)  GLUCOSE, CAPILLARY      Component Value Range   Glucose-Capillary 68 (*) 70 - 99 (mg/dL)  GLUCOSE, CAPILLARY      Component Value Range   Glucose-Capillary 91  70 - 99 (mg/dL)   Comment 1 Notify RN    GLUCOSE, CAPILLARY      Component Value Range   Glucose-Capillary 58 (*) 70 - 99 (mg/dL)   Comment 1 Notify RN    GLUCOSE, CAPILLARY  Component Value Range   Glucose-Capillary 134 (*) 70 - 99 (mg/dL)   Comment 1 Notify RN    GLUCOSE, CAPILLARY      Component Value Range   Glucose-Capillary 98  70 - 99 (mg/dL)     Signed: Nera Haworth NEVILL 12/22/2011, 7:31 AM

## 2011-12-22 NOTE — Progress Notes (Signed)
4 Days Post-Op  Subjective: Seen by DR.Gates who has a discharge summary already on chart from this AM.  He wants to go home.  He says family will take care of him at night and caretaker he had before admission will attend to him  During the day.  Objective: Vital signs in last 24 hours: Temp:  [97.5 F (36.4 C)-98.2 F (36.8 C)] 97.9 F (36.6 C) (02/01 0555) Pulse Rate:  [80-85] 80  (02/01 0555) Resp:  [18-19] 18  (02/01 0555) BP: (125-155)/(62-75) 147/63 mmHg (02/01 0555) SpO2:  [93 %-97 %] 94 % (02/01 0555) Last BM Date: 12/21/11  Intake/Output from previous day: 01/31 0701 - 02/01 0700 In: 600 [P.O.:600] Out: -  Intake/Output this shift:   PE: Up in chair, Chest: decreased BS, but he's clear.  Abd: Staples in, Wound OK, +BS, +BM.  He normally walks at home with walker.   Lab Results:   [facility] 12/21/11 0919  WBC 9.5  HGB 10.9*  HCT 35.6*  PLT 269    BMET  Basename 12/21/11 0919  NA 142  K 4.0  CL 106  CO2 28  GLUCOSE 69*  BUN 11  CREATININE 0.93  CALCIUM 8.9   PT/INR No results found for this basename: LABPROT:2,INR:2 in the last 72 hours   Studies/Results: No results found.  Anti-infectives: Anti-infectives     Start     Dose/Rate Route Frequency Ordered Stop   12/18/11 1600   ceFAZolin (ANCEF) IVPB 2 g/50 mL premix        2 g 100 mL/hr over 30 Minutes Intravenous 3 times per day 12/18/11 1341 12/19/11 0543   12/17/11 0931   ceFAZolin (ANCEF) IVPB 2 g/50 mL premix        2 g 100 mL/hr over 30 Minutes Intravenous 60 min pre-op 12/17/11 0931 12/18/11 0830         Current Facility-Administered Medications  Medication Dose Route Frequency Provider Last Rate Last Dose  . clopidogrel (PLAVIX) tablet 75 mg  75 mg Oral Q breakfast Letha Cape, PA   75 mg at 12/21/11 5784  . darifenacin (ENABLEX) 24 hr tablet 7.5 mg  7.5 mg Oral Daily Cherylynn Ridges III, MD   7.5 mg at 12/21/11 1009  . dextrose 50 % solution           . finasteride (PROSCAR)  tablet 5 mg  5 mg Oral Daily Cherylynn Ridges III, MD   5 mg at 12/21/11 1009  . HYDROcodone-acetaminophen (NORCO) 5-325 MG per tablet 1-2 tablet  1-2 tablet Oral Q4H PRN Ernestene Mention, MD   2 tablet at 12/21/11 1859  . pregabalin (LYRICA) capsule 50 mg  50 mg Oral BID Cherylynn Ridges III, MD   50 mg at 12/21/11 2230  . simvastatin (ZOCOR) tablet 20 mg  20 mg Oral q1800 Cherylynn Ridges III, MD   20 mg at 12/21/11 1900  . DISCONTD: 0.9 % NaCl with KCl 20 mEq/ L  infusion   Intravenous Continuous Ernestene Mention, MD 50 mL/hr at 12/21/11 [phone]    . DISCONTD: dextrose (GLUTOSE) 40 % oral gel 37.5 g  1 Tube Oral PRN Ernestene Mention, MD      . DISCONTD: glucose-Vitamin C 4-0.006 GM per chewable tablet 4 tablet  4 tablet Oral PRN Ernestene Mention, MD      . DISCONTD: heparin injection 5,000 Units  5,000 Units Subcutaneous Q8H Ernestene Mention, MD   5,000 Units at  12/22/11 0654  . DISCONTD: HYDROmorphone (DILAUDID) injection 0.25-0.5 mg  0.25-0.5 mg Intravenous Q5 min PRN Herby Abraham, PHARMD   0.5 mg at 12/18/11 1224  . DISCONTD: insulin aspart (novoLOG) injection 0-9 Units  0-9 Units Subcutaneous TID WC Marianna Fuss, PA   1 Units at 12/15/11 1758  . DISCONTD: insulin glargine (LANTUS) injection 20 Units  20 Units Subcutaneous Daily Pearla Dubonnet, MD   20 Units at 12/21/11 1010  . DISCONTD: morphine 2 MG/ML injection 2-4 mg  2-4 mg Intravenous Q2H PRN Cherylynn Ridges III, MD   4 mg at 12/20/11 0120  . DISCONTD: olmesartan (BENICAR) tablet 40 mg  40 mg Oral Daily Cherylynn Ridges III, MD   40 mg at 12/21/11 1009  . DISCONTD: ondansetron (ZOFRAN) injection 4 mg  4 mg Intravenous Q6H PRN Cherylynn Ridges III, MD   4 mg at 12/17/11 1137  . DISCONTD: ondansetron (ZOFRAN) injection 4 mg  4 mg Intravenous Q6H PRN Ernestene Mention, MD   4 mg at 12/20/11 2248  . DISCONTD: ondansetron (ZOFRAN) injection 4 mg  4 mg Intravenous Q6H PRN Herby Abraham, PHARMD      . DISCONTD: ondansetron (ZOFRAN) tablet 4 mg  4 mg  Oral Q6H PRN Ernestene Mention, MD      . DISCONTD: pantoprazole (PROTONIX) EC tablet 40 mg  40 mg Oral Daily Cherylynn Ridges III, MD   40 mg at 12/21/11 1010    Assessment/Plan Repair of incarcerated ventral hernia with mesh, debride 15 cm X 10 cm area of skin and subcutaneous tissue. 12/18/11 On Plavix .History of PSVT (paroxysmal supraventricular tachycardia) - controlled  DM (diabetes mellitus) - episodes of hypoglycemia, Lantus has been reduced at discharge  Renal insufficiency - resolved. Discharge creatinine 0.93  History of CVA (cerebrovascular accident) - aware, stable  Dyslipidemia - cholesterol 99 and HDL 25 and LDL 49 on 12/16/2011  Obesity (BMI 30-39.9)  Depression    Plan:  I will go over DR. Gates D/C info and be sure he has follow up with DR. Ingram,  Staple removal and assist in discharge. I have added a follow-up appointment with DR. Derrell Lolling 12/28/11 @10 :30AM, and added Open abdominal surgery post op instructions.    LOS: 9 days    Jared Hall 12/22/2011

## 2011-12-22 NOTE — Progress Notes (Signed)
  Patient seen and examined. I agree with assessment and care plan as outlined by Mr. Marlyne Beards.  I will see next week for wound check.   Angelia Mould. Derrell Lolling, M.D., [facility] General and Minimally invasive Surgery Breast and Colorectal Surgery Office:   [phone] Pager:   [phone]

## 2011-12-22 NOTE — Discharge Planning (Signed)
Patient discharged home in stable condition. Verbalizes understanding of all discharge instructions, including home medications and follow up appointments.   Earna Coder, RN

## 2011-12-25 DIAGNOSIS — R269 Unspecified abnormalities of gait and mobility: Secondary | ICD-10-CM | POA: Diagnosis not present

## 2011-12-25 DIAGNOSIS — Z8673 Personal history of transient ischemic attack (TIA), and cerebral infarction without residual deficits: Secondary | ICD-10-CM | POA: Diagnosis not present

## 2011-12-25 DIAGNOSIS — N189 Chronic kidney disease, unspecified: Secondary | ICD-10-CM | POA: Diagnosis not present

## 2011-12-25 DIAGNOSIS — E119 Type 2 diabetes mellitus without complications: Secondary | ICD-10-CM | POA: Diagnosis not present

## 2011-12-25 DIAGNOSIS — F329 Major depressive disorder, single episode, unspecified: Secondary | ICD-10-CM | POA: Diagnosis not present

## 2011-12-27 DIAGNOSIS — F329 Major depressive disorder, single episode, unspecified: Secondary | ICD-10-CM | POA: Diagnosis not present

## 2011-12-27 DIAGNOSIS — Z8673 Personal history of transient ischemic attack (TIA), and cerebral infarction without residual deficits: Secondary | ICD-10-CM | POA: Diagnosis not present

## 2011-12-27 DIAGNOSIS — K429 Umbilical hernia without obstruction or gangrene: Secondary | ICD-10-CM | POA: Diagnosis not present

## 2011-12-27 DIAGNOSIS — E119 Type 2 diabetes mellitus without complications: Secondary | ICD-10-CM | POA: Diagnosis not present

## 2011-12-27 DIAGNOSIS — R269 Unspecified abnormalities of gait and mobility: Secondary | ICD-10-CM | POA: Diagnosis not present

## 2011-12-27 DIAGNOSIS — N189 Chronic kidney disease, unspecified: Secondary | ICD-10-CM | POA: Diagnosis not present

## 2011-12-28 ENCOUNTER — Encounter (INDEPENDENT_AMBULATORY_CARE_PROVIDER_SITE_OTHER): Payer: Medicare Other | Admitting: General Surgery

## 2011-12-28 DIAGNOSIS — R269 Unspecified abnormalities of gait and mobility: Secondary | ICD-10-CM | POA: Diagnosis not present

## 2011-12-28 DIAGNOSIS — N189 Chronic kidney disease, unspecified: Secondary | ICD-10-CM | POA: Diagnosis not present

## 2011-12-28 DIAGNOSIS — Z8673 Personal history of transient ischemic attack (TIA), and cerebral infarction without residual deficits: Secondary | ICD-10-CM | POA: Diagnosis not present

## 2011-12-28 DIAGNOSIS — F329 Major depressive disorder, single episode, unspecified: Secondary | ICD-10-CM | POA: Diagnosis not present

## 2011-12-28 DIAGNOSIS — E119 Type 2 diabetes mellitus without complications: Secondary | ICD-10-CM | POA: Diagnosis not present

## 2011-12-29 ENCOUNTER — Encounter (INDEPENDENT_AMBULATORY_CARE_PROVIDER_SITE_OTHER): Payer: Medicare Other

## 2011-12-29 DIAGNOSIS — N189 Chronic kidney disease, unspecified: Secondary | ICD-10-CM | POA: Diagnosis not present

## 2011-12-29 DIAGNOSIS — Z8673 Personal history of transient ischemic attack (TIA), and cerebral infarction without residual deficits: Secondary | ICD-10-CM | POA: Diagnosis not present

## 2011-12-29 DIAGNOSIS — F329 Major depressive disorder, single episode, unspecified: Secondary | ICD-10-CM | POA: Diagnosis not present

## 2011-12-29 DIAGNOSIS — E119 Type 2 diabetes mellitus without complications: Secondary | ICD-10-CM | POA: Diagnosis not present

## 2011-12-29 DIAGNOSIS — R269 Unspecified abnormalities of gait and mobility: Secondary | ICD-10-CM | POA: Diagnosis not present

## 2012-01-01 DIAGNOSIS — F329 Major depressive disorder, single episode, unspecified: Secondary | ICD-10-CM | POA: Diagnosis not present

## 2012-01-01 DIAGNOSIS — E119 Type 2 diabetes mellitus without complications: Secondary | ICD-10-CM | POA: Diagnosis not present

## 2012-01-01 DIAGNOSIS — N189 Chronic kidney disease, unspecified: Secondary | ICD-10-CM | POA: Diagnosis not present

## 2012-01-01 DIAGNOSIS — R269 Unspecified abnormalities of gait and mobility: Secondary | ICD-10-CM | POA: Diagnosis not present

## 2012-01-01 DIAGNOSIS — Z8673 Personal history of transient ischemic attack (TIA), and cerebral infarction without residual deficits: Secondary | ICD-10-CM | POA: Diagnosis not present

## 2012-01-02 DIAGNOSIS — F329 Major depressive disorder, single episode, unspecified: Secondary | ICD-10-CM | POA: Diagnosis not present

## 2012-01-02 DIAGNOSIS — R269 Unspecified abnormalities of gait and mobility: Secondary | ICD-10-CM | POA: Diagnosis not present

## 2012-01-02 DIAGNOSIS — E119 Type 2 diabetes mellitus without complications: Secondary | ICD-10-CM | POA: Diagnosis not present

## 2012-01-02 DIAGNOSIS — Z8673 Personal history of transient ischemic attack (TIA), and cerebral infarction without residual deficits: Secondary | ICD-10-CM | POA: Diagnosis not present

## 2012-01-02 DIAGNOSIS — N189 Chronic kidney disease, unspecified: Secondary | ICD-10-CM | POA: Diagnosis not present

## 2012-01-03 DIAGNOSIS — F329 Major depressive disorder, single episode, unspecified: Secondary | ICD-10-CM | POA: Diagnosis not present

## 2012-01-03 DIAGNOSIS — N189 Chronic kidney disease, unspecified: Secondary | ICD-10-CM | POA: Diagnosis not present

## 2012-01-03 DIAGNOSIS — R269 Unspecified abnormalities of gait and mobility: Secondary | ICD-10-CM | POA: Diagnosis not present

## 2012-01-03 DIAGNOSIS — Z8673 Personal history of transient ischemic attack (TIA), and cerebral infarction without residual deficits: Secondary | ICD-10-CM | POA: Diagnosis not present

## 2012-01-03 DIAGNOSIS — E119 Type 2 diabetes mellitus without complications: Secondary | ICD-10-CM | POA: Diagnosis not present

## 2012-01-05 ENCOUNTER — Telehealth (INDEPENDENT_AMBULATORY_CARE_PROVIDER_SITE_OTHER): Payer: Self-pay

## 2012-01-05 DIAGNOSIS — N189 Chronic kidney disease, unspecified: Secondary | ICD-10-CM | POA: Diagnosis not present

## 2012-01-05 DIAGNOSIS — R269 Unspecified abnormalities of gait and mobility: Secondary | ICD-10-CM | POA: Diagnosis not present

## 2012-01-05 DIAGNOSIS — Z8673 Personal history of transient ischemic attack (TIA), and cerebral infarction without residual deficits: Secondary | ICD-10-CM | POA: Diagnosis not present

## 2012-01-05 DIAGNOSIS — E119 Type 2 diabetes mellitus without complications: Secondary | ICD-10-CM | POA: Diagnosis not present

## 2012-01-05 DIAGNOSIS — F329 Major depressive disorder, single episode, unspecified: Secondary | ICD-10-CM | POA: Diagnosis not present

## 2012-01-05 NOTE — Telephone Encounter (Signed)
Tresa Endo at [facility] requests order for [facility] aid and Social worker consult due to living conditions of pt. Also I noticed the pt does not have F/U appt with our office until 3-14 for wound check. I will route this note to Dr Derrell Lolling for review of order request and if he wants pt seen sooner for wound check.

## 2012-01-05 NOTE — Telephone Encounter (Signed)
Per Dr Aura Camps request Tresa Endo at [facility] given ok to set up [facility] aid and social worker consult. She will also advise [facility] to call with wound update and if wounds healing well 02-01-12 ov will be ok.

## 2012-01-08 DIAGNOSIS — R269 Unspecified abnormalities of gait and mobility: Secondary | ICD-10-CM | POA: Diagnosis not present

## 2012-01-08 DIAGNOSIS — E119 Type 2 diabetes mellitus without complications: Secondary | ICD-10-CM | POA: Diagnosis not present

## 2012-01-08 DIAGNOSIS — Z8673 Personal history of transient ischemic attack (TIA), and cerebral infarction without residual deficits: Secondary | ICD-10-CM | POA: Diagnosis not present

## 2012-01-08 DIAGNOSIS — F329 Major depressive disorder, single episode, unspecified: Secondary | ICD-10-CM | POA: Diagnosis not present

## 2012-01-08 DIAGNOSIS — N189 Chronic kidney disease, unspecified: Secondary | ICD-10-CM | POA: Diagnosis not present

## 2012-01-09 DIAGNOSIS — N189 Chronic kidney disease, unspecified: Secondary | ICD-10-CM | POA: Diagnosis not present

## 2012-01-09 DIAGNOSIS — F329 Major depressive disorder, single episode, unspecified: Secondary | ICD-10-CM | POA: Diagnosis not present

## 2012-01-09 DIAGNOSIS — Z8673 Personal history of transient ischemic attack (TIA), and cerebral infarction without residual deficits: Secondary | ICD-10-CM | POA: Diagnosis not present

## 2012-01-09 DIAGNOSIS — E119 Type 2 diabetes mellitus without complications: Secondary | ICD-10-CM | POA: Diagnosis not present

## 2012-01-09 DIAGNOSIS — R269 Unspecified abnormalities of gait and mobility: Secondary | ICD-10-CM | POA: Diagnosis not present

## 2012-01-11 DIAGNOSIS — F329 Major depressive disorder, single episode, unspecified: Secondary | ICD-10-CM | POA: Diagnosis not present

## 2012-01-11 DIAGNOSIS — E119 Type 2 diabetes mellitus without complications: Secondary | ICD-10-CM | POA: Diagnosis not present

## 2012-01-11 DIAGNOSIS — N189 Chronic kidney disease, unspecified: Secondary | ICD-10-CM | POA: Diagnosis not present

## 2012-01-11 DIAGNOSIS — Z8673 Personal history of transient ischemic attack (TIA), and cerebral infarction without residual deficits: Secondary | ICD-10-CM | POA: Diagnosis not present

## 2012-01-11 DIAGNOSIS — R269 Unspecified abnormalities of gait and mobility: Secondary | ICD-10-CM | POA: Diagnosis not present

## 2012-01-15 DIAGNOSIS — E119 Type 2 diabetes mellitus without complications: Secondary | ICD-10-CM | POA: Diagnosis not present

## 2012-01-15 DIAGNOSIS — N189 Chronic kidney disease, unspecified: Secondary | ICD-10-CM | POA: Diagnosis not present

## 2012-01-15 DIAGNOSIS — R269 Unspecified abnormalities of gait and mobility: Secondary | ICD-10-CM | POA: Diagnosis not present

## 2012-01-15 DIAGNOSIS — F329 Major depressive disorder, single episode, unspecified: Secondary | ICD-10-CM | POA: Diagnosis not present

## 2012-01-15 DIAGNOSIS — Z8673 Personal history of transient ischemic attack (TIA), and cerebral infarction without residual deficits: Secondary | ICD-10-CM | POA: Diagnosis not present

## 2012-01-16 DIAGNOSIS — F329 Major depressive disorder, single episode, unspecified: Secondary | ICD-10-CM | POA: Diagnosis not present

## 2012-01-16 DIAGNOSIS — Z8673 Personal history of transient ischemic attack (TIA), and cerebral infarction without residual deficits: Secondary | ICD-10-CM | POA: Diagnosis not present

## 2012-01-16 DIAGNOSIS — N189 Chronic kidney disease, unspecified: Secondary | ICD-10-CM | POA: Diagnosis not present

## 2012-01-16 DIAGNOSIS — R269 Unspecified abnormalities of gait and mobility: Secondary | ICD-10-CM | POA: Diagnosis not present

## 2012-01-16 DIAGNOSIS — E119 Type 2 diabetes mellitus without complications: Secondary | ICD-10-CM | POA: Diagnosis not present

## 2012-01-17 DIAGNOSIS — N189 Chronic kidney disease, unspecified: Secondary | ICD-10-CM | POA: Diagnosis not present

## 2012-01-17 DIAGNOSIS — R269 Unspecified abnormalities of gait and mobility: Secondary | ICD-10-CM | POA: Diagnosis not present

## 2012-01-17 DIAGNOSIS — F329 Major depressive disorder, single episode, unspecified: Secondary | ICD-10-CM | POA: Diagnosis not present

## 2012-01-17 DIAGNOSIS — E119 Type 2 diabetes mellitus without complications: Secondary | ICD-10-CM | POA: Diagnosis not present

## 2012-01-17 DIAGNOSIS — Z8673 Personal history of transient ischemic attack (TIA), and cerebral infarction without residual deficits: Secondary | ICD-10-CM | POA: Diagnosis not present

## 2012-01-18 DIAGNOSIS — Z8673 Personal history of transient ischemic attack (TIA), and cerebral infarction without residual deficits: Secondary | ICD-10-CM | POA: Diagnosis not present

## 2012-01-18 DIAGNOSIS — N189 Chronic kidney disease, unspecified: Secondary | ICD-10-CM | POA: Diagnosis not present

## 2012-01-18 DIAGNOSIS — F329 Major depressive disorder, single episode, unspecified: Secondary | ICD-10-CM | POA: Diagnosis not present

## 2012-01-18 DIAGNOSIS — E119 Type 2 diabetes mellitus without complications: Secondary | ICD-10-CM | POA: Diagnosis not present

## 2012-01-18 DIAGNOSIS — R269 Unspecified abnormalities of gait and mobility: Secondary | ICD-10-CM | POA: Diagnosis not present

## 2012-01-22 DIAGNOSIS — N189 Chronic kidney disease, unspecified: Secondary | ICD-10-CM | POA: Diagnosis not present

## 2012-01-22 DIAGNOSIS — F329 Major depressive disorder, single episode, unspecified: Secondary | ICD-10-CM | POA: Diagnosis not present

## 2012-01-22 DIAGNOSIS — R269 Unspecified abnormalities of gait and mobility: Secondary | ICD-10-CM | POA: Diagnosis not present

## 2012-01-22 DIAGNOSIS — Z8673 Personal history of transient ischemic attack (TIA), and cerebral infarction without residual deficits: Secondary | ICD-10-CM | POA: Diagnosis not present

## 2012-01-22 DIAGNOSIS — E119 Type 2 diabetes mellitus without complications: Secondary | ICD-10-CM | POA: Diagnosis not present

## 2012-01-24 DIAGNOSIS — E119 Type 2 diabetes mellitus without complications: Secondary | ICD-10-CM | POA: Diagnosis not present

## 2012-01-24 DIAGNOSIS — F329 Major depressive disorder, single episode, unspecified: Secondary | ICD-10-CM | POA: Diagnosis not present

## 2012-01-24 DIAGNOSIS — R269 Unspecified abnormalities of gait and mobility: Secondary | ICD-10-CM | POA: Diagnosis not present

## 2012-01-24 DIAGNOSIS — Z8673 Personal history of transient ischemic attack (TIA), and cerebral infarction without residual deficits: Secondary | ICD-10-CM | POA: Diagnosis not present

## 2012-01-24 DIAGNOSIS — N189 Chronic kidney disease, unspecified: Secondary | ICD-10-CM | POA: Diagnosis not present

## 2012-01-31 DIAGNOSIS — E119 Type 2 diabetes mellitus without complications: Secondary | ICD-10-CM | POA: Diagnosis not present

## 2012-01-31 DIAGNOSIS — F329 Major depressive disorder, single episode, unspecified: Secondary | ICD-10-CM | POA: Diagnosis not present

## 2012-01-31 DIAGNOSIS — Z8673 Personal history of transient ischemic attack (TIA), and cerebral infarction without residual deficits: Secondary | ICD-10-CM | POA: Diagnosis not present

## 2012-01-31 DIAGNOSIS — R269 Unspecified abnormalities of gait and mobility: Secondary | ICD-10-CM | POA: Diagnosis not present

## 2012-01-31 DIAGNOSIS — N189 Chronic kidney disease, unspecified: Secondary | ICD-10-CM | POA: Diagnosis not present

## 2012-02-01 ENCOUNTER — Encounter (INDEPENDENT_AMBULATORY_CARE_PROVIDER_SITE_OTHER): Payer: Medicare Other | Admitting: General Surgery

## 2012-02-07 DIAGNOSIS — R269 Unspecified abnormalities of gait and mobility: Secondary | ICD-10-CM | POA: Diagnosis not present

## 2012-02-07 DIAGNOSIS — F329 Major depressive disorder, single episode, unspecified: Secondary | ICD-10-CM | POA: Diagnosis not present

## 2012-02-07 DIAGNOSIS — Z8673 Personal history of transient ischemic attack (TIA), and cerebral infarction without residual deficits: Secondary | ICD-10-CM | POA: Diagnosis not present

## 2012-02-07 DIAGNOSIS — N189 Chronic kidney disease, unspecified: Secondary | ICD-10-CM | POA: Diagnosis not present

## 2012-02-07 DIAGNOSIS — E119 Type 2 diabetes mellitus without complications: Secondary | ICD-10-CM | POA: Diagnosis not present

## 2012-02-14 DIAGNOSIS — F329 Major depressive disorder, single episode, unspecified: Secondary | ICD-10-CM | POA: Diagnosis not present

## 2012-02-14 DIAGNOSIS — Z8673 Personal history of transient ischemic attack (TIA), and cerebral infarction without residual deficits: Secondary | ICD-10-CM | POA: Diagnosis not present

## 2012-02-14 DIAGNOSIS — R269 Unspecified abnormalities of gait and mobility: Secondary | ICD-10-CM | POA: Diagnosis not present

## 2012-02-14 DIAGNOSIS — E119 Type 2 diabetes mellitus without complications: Secondary | ICD-10-CM | POA: Diagnosis not present

## 2012-02-14 DIAGNOSIS — N189 Chronic kidney disease, unspecified: Secondary | ICD-10-CM | POA: Diagnosis not present

## 2012-02-20 DIAGNOSIS — F329 Major depressive disorder, single episode, unspecified: Secondary | ICD-10-CM | POA: Diagnosis not present

## 2012-02-20 DIAGNOSIS — N189 Chronic kidney disease, unspecified: Secondary | ICD-10-CM | POA: Diagnosis not present

## 2012-02-20 DIAGNOSIS — Z8673 Personal history of transient ischemic attack (TIA), and cerebral infarction without residual deficits: Secondary | ICD-10-CM | POA: Diagnosis not present

## 2012-02-20 DIAGNOSIS — R269 Unspecified abnormalities of gait and mobility: Secondary | ICD-10-CM | POA: Diagnosis not present

## 2012-02-20 DIAGNOSIS — E119 Type 2 diabetes mellitus without complications: Secondary | ICD-10-CM | POA: Diagnosis not present

## 2012-03-18 DIAGNOSIS — G609 Hereditary and idiopathic neuropathy, unspecified: Secondary | ICD-10-CM | POA: Diagnosis not present

## 2012-03-18 DIAGNOSIS — E1149 Type 2 diabetes mellitus with other diabetic neurological complication: Secondary | ICD-10-CM | POA: Diagnosis not present

## 2012-03-18 DIAGNOSIS — R269 Unspecified abnormalities of gait and mobility: Secondary | ICD-10-CM | POA: Diagnosis not present

## 2012-03-18 DIAGNOSIS — B351 Tinea unguium: Secondary | ICD-10-CM | POA: Diagnosis not present

## 2012-03-18 DIAGNOSIS — L218 Other seborrheic dermatitis: Secondary | ICD-10-CM | POA: Diagnosis not present

## 2012-03-21 DIAGNOSIS — E1142 Type 2 diabetes mellitus with diabetic polyneuropathy: Secondary | ICD-10-CM | POA: Diagnosis not present

## 2012-03-21 DIAGNOSIS — B350 Tinea barbae and tinea capitis: Secondary | ICD-10-CM | POA: Diagnosis not present

## 2012-03-21 DIAGNOSIS — Z7901 Long term (current) use of anticoagulants: Secondary | ICD-10-CM | POA: Diagnosis not present

## 2012-03-21 DIAGNOSIS — F329 Major depressive disorder, single episode, unspecified: Secondary | ICD-10-CM | POA: Diagnosis not present

## 2012-03-21 DIAGNOSIS — E1149 Type 2 diabetes mellitus with other diabetic neurological complication: Secondary | ICD-10-CM | POA: Diagnosis not present

## 2012-03-21 DIAGNOSIS — M1710 Unilateral primary osteoarthritis, unspecified knee: Secondary | ICD-10-CM | POA: Diagnosis not present

## 2012-03-21 DIAGNOSIS — R609 Edema, unspecified: Secondary | ICD-10-CM | POA: Diagnosis not present

## 2012-03-21 DIAGNOSIS — L97909 Non-pressure chronic ulcer of unspecified part of unspecified lower leg with unspecified severity: Secondary | ICD-10-CM | POA: Diagnosis not present

## 2012-03-21 DIAGNOSIS — I69998 Other sequelae following unspecified cerebrovascular disease: Secondary | ICD-10-CM | POA: Diagnosis not present

## 2012-03-21 DIAGNOSIS — R269 Unspecified abnormalities of gait and mobility: Secondary | ICD-10-CM | POA: Diagnosis not present

## 2012-03-21 DIAGNOSIS — I5030 Unspecified diastolic (congestive) heart failure: Secondary | ICD-10-CM | POA: Diagnosis not present

## 2012-03-21 DIAGNOSIS — I10 Essential (primary) hypertension: Secondary | ICD-10-CM | POA: Diagnosis not present

## 2012-03-25 DIAGNOSIS — M1710 Unilateral primary osteoarthritis, unspecified knee: Secondary | ICD-10-CM | POA: Diagnosis not present

## 2012-03-25 DIAGNOSIS — R269 Unspecified abnormalities of gait and mobility: Secondary | ICD-10-CM | POA: Diagnosis not present

## 2012-03-25 DIAGNOSIS — E1142 Type 2 diabetes mellitus with diabetic polyneuropathy: Secondary | ICD-10-CM | POA: Diagnosis not present

## 2012-03-25 DIAGNOSIS — I69998 Other sequelae following unspecified cerebrovascular disease: Secondary | ICD-10-CM | POA: Diagnosis not present

## 2012-03-25 DIAGNOSIS — F329 Major depressive disorder, single episode, unspecified: Secondary | ICD-10-CM | POA: Diagnosis not present

## 2012-03-25 DIAGNOSIS — E1149 Type 2 diabetes mellitus with other diabetic neurological complication: Secondary | ICD-10-CM | POA: Diagnosis not present

## 2012-03-26 DIAGNOSIS — F329 Major depressive disorder, single episode, unspecified: Secondary | ICD-10-CM | POA: Diagnosis not present

## 2012-03-26 DIAGNOSIS — E1142 Type 2 diabetes mellitus with diabetic polyneuropathy: Secondary | ICD-10-CM | POA: Diagnosis not present

## 2012-03-26 DIAGNOSIS — R269 Unspecified abnormalities of gait and mobility: Secondary | ICD-10-CM | POA: Diagnosis not present

## 2012-03-26 DIAGNOSIS — M1710 Unilateral primary osteoarthritis, unspecified knee: Secondary | ICD-10-CM | POA: Diagnosis not present

## 2012-03-26 DIAGNOSIS — E1149 Type 2 diabetes mellitus with other diabetic neurological complication: Secondary | ICD-10-CM | POA: Diagnosis not present

## 2012-03-26 DIAGNOSIS — I69998 Other sequelae following unspecified cerebrovascular disease: Secondary | ICD-10-CM | POA: Diagnosis not present

## 2012-03-28 DIAGNOSIS — I69998 Other sequelae following unspecified cerebrovascular disease: Secondary | ICD-10-CM | POA: Diagnosis not present

## 2012-03-28 DIAGNOSIS — E1142 Type 2 diabetes mellitus with diabetic polyneuropathy: Secondary | ICD-10-CM | POA: Diagnosis not present

## 2012-03-28 DIAGNOSIS — F329 Major depressive disorder, single episode, unspecified: Secondary | ICD-10-CM | POA: Diagnosis not present

## 2012-03-28 DIAGNOSIS — R269 Unspecified abnormalities of gait and mobility: Secondary | ICD-10-CM | POA: Diagnosis not present

## 2012-03-28 DIAGNOSIS — E1149 Type 2 diabetes mellitus with other diabetic neurological complication: Secondary | ICD-10-CM | POA: Diagnosis not present

## 2012-03-28 DIAGNOSIS — M1710 Unilateral primary osteoarthritis, unspecified knee: Secondary | ICD-10-CM | POA: Diagnosis not present

## 2012-04-01 DIAGNOSIS — E1149 Type 2 diabetes mellitus with other diabetic neurological complication: Secondary | ICD-10-CM | POA: Diagnosis not present

## 2012-04-01 DIAGNOSIS — I69998 Other sequelae following unspecified cerebrovascular disease: Secondary | ICD-10-CM | POA: Diagnosis not present

## 2012-04-01 DIAGNOSIS — F329 Major depressive disorder, single episode, unspecified: Secondary | ICD-10-CM | POA: Diagnosis not present

## 2012-04-01 DIAGNOSIS — M1710 Unilateral primary osteoarthritis, unspecified knee: Secondary | ICD-10-CM | POA: Diagnosis not present

## 2012-04-01 DIAGNOSIS — R269 Unspecified abnormalities of gait and mobility: Secondary | ICD-10-CM | POA: Diagnosis not present

## 2012-04-01 DIAGNOSIS — E1142 Type 2 diabetes mellitus with diabetic polyneuropathy: Secondary | ICD-10-CM | POA: Diagnosis not present

## 2012-04-02 DIAGNOSIS — R269 Unspecified abnormalities of gait and mobility: Secondary | ICD-10-CM | POA: Diagnosis not present

## 2012-04-02 DIAGNOSIS — F329 Major depressive disorder, single episode, unspecified: Secondary | ICD-10-CM | POA: Diagnosis not present

## 2012-04-02 DIAGNOSIS — M1710 Unilateral primary osteoarthritis, unspecified knee: Secondary | ICD-10-CM | POA: Diagnosis not present

## 2012-04-02 DIAGNOSIS — E1149 Type 2 diabetes mellitus with other diabetic neurological complication: Secondary | ICD-10-CM | POA: Diagnosis not present

## 2012-04-02 DIAGNOSIS — E1142 Type 2 diabetes mellitus with diabetic polyneuropathy: Secondary | ICD-10-CM | POA: Diagnosis not present

## 2012-04-02 DIAGNOSIS — I69998 Other sequelae following unspecified cerebrovascular disease: Secondary | ICD-10-CM | POA: Diagnosis not present

## 2012-04-04 DIAGNOSIS — I69998 Other sequelae following unspecified cerebrovascular disease: Secondary | ICD-10-CM | POA: Diagnosis not present

## 2012-04-04 DIAGNOSIS — E1142 Type 2 diabetes mellitus with diabetic polyneuropathy: Secondary | ICD-10-CM | POA: Diagnosis not present

## 2012-04-04 DIAGNOSIS — M1710 Unilateral primary osteoarthritis, unspecified knee: Secondary | ICD-10-CM | POA: Diagnosis not present

## 2012-04-04 DIAGNOSIS — R269 Unspecified abnormalities of gait and mobility: Secondary | ICD-10-CM | POA: Diagnosis not present

## 2012-04-04 DIAGNOSIS — E1149 Type 2 diabetes mellitus with other diabetic neurological complication: Secondary | ICD-10-CM | POA: Diagnosis not present

## 2012-04-04 DIAGNOSIS — F329 Major depressive disorder, single episode, unspecified: Secondary | ICD-10-CM | POA: Diagnosis not present

## 2012-04-05 DIAGNOSIS — E1149 Type 2 diabetes mellitus with other diabetic neurological complication: Secondary | ICD-10-CM | POA: Diagnosis not present

## 2012-04-05 DIAGNOSIS — E1142 Type 2 diabetes mellitus with diabetic polyneuropathy: Secondary | ICD-10-CM | POA: Diagnosis not present

## 2012-04-05 DIAGNOSIS — M1710 Unilateral primary osteoarthritis, unspecified knee: Secondary | ICD-10-CM | POA: Diagnosis not present

## 2012-04-05 DIAGNOSIS — I69998 Other sequelae following unspecified cerebrovascular disease: Secondary | ICD-10-CM | POA: Diagnosis not present

## 2012-04-05 DIAGNOSIS — F329 Major depressive disorder, single episode, unspecified: Secondary | ICD-10-CM | POA: Diagnosis not present

## 2012-04-05 DIAGNOSIS — R269 Unspecified abnormalities of gait and mobility: Secondary | ICD-10-CM | POA: Diagnosis not present

## 2012-04-09 DIAGNOSIS — R269 Unspecified abnormalities of gait and mobility: Secondary | ICD-10-CM | POA: Diagnosis not present

## 2012-04-09 DIAGNOSIS — M1710 Unilateral primary osteoarthritis, unspecified knee: Secondary | ICD-10-CM | POA: Diagnosis not present

## 2012-04-09 DIAGNOSIS — I69998 Other sequelae following unspecified cerebrovascular disease: Secondary | ICD-10-CM | POA: Diagnosis not present

## 2012-04-09 DIAGNOSIS — F329 Major depressive disorder, single episode, unspecified: Secondary | ICD-10-CM | POA: Diagnosis not present

## 2012-04-09 DIAGNOSIS — E1142 Type 2 diabetes mellitus with diabetic polyneuropathy: Secondary | ICD-10-CM | POA: Diagnosis not present

## 2012-04-09 DIAGNOSIS — E1149 Type 2 diabetes mellitus with other diabetic neurological complication: Secondary | ICD-10-CM | POA: Diagnosis not present

## 2012-04-10 DIAGNOSIS — F329 Major depressive disorder, single episode, unspecified: Secondary | ICD-10-CM | POA: Diagnosis not present

## 2012-04-10 DIAGNOSIS — I69998 Other sequelae following unspecified cerebrovascular disease: Secondary | ICD-10-CM | POA: Diagnosis not present

## 2012-04-10 DIAGNOSIS — M1710 Unilateral primary osteoarthritis, unspecified knee: Secondary | ICD-10-CM | POA: Diagnosis not present

## 2012-04-10 DIAGNOSIS — E1142 Type 2 diabetes mellitus with diabetic polyneuropathy: Secondary | ICD-10-CM | POA: Diagnosis not present

## 2012-04-10 DIAGNOSIS — E1149 Type 2 diabetes mellitus with other diabetic neurological complication: Secondary | ICD-10-CM | POA: Diagnosis not present

## 2012-04-10 DIAGNOSIS — R269 Unspecified abnormalities of gait and mobility: Secondary | ICD-10-CM | POA: Diagnosis not present

## 2012-04-11 DIAGNOSIS — I69998 Other sequelae following unspecified cerebrovascular disease: Secondary | ICD-10-CM | POA: Diagnosis not present

## 2012-04-11 DIAGNOSIS — E1149 Type 2 diabetes mellitus with other diabetic neurological complication: Secondary | ICD-10-CM | POA: Diagnosis not present

## 2012-04-11 DIAGNOSIS — E1142 Type 2 diabetes mellitus with diabetic polyneuropathy: Secondary | ICD-10-CM | POA: Diagnosis not present

## 2012-04-11 DIAGNOSIS — F329 Major depressive disorder, single episode, unspecified: Secondary | ICD-10-CM | POA: Diagnosis not present

## 2012-04-11 DIAGNOSIS — R269 Unspecified abnormalities of gait and mobility: Secondary | ICD-10-CM | POA: Diagnosis not present

## 2012-04-11 DIAGNOSIS — M1710 Unilateral primary osteoarthritis, unspecified knee: Secondary | ICD-10-CM | POA: Diagnosis not present

## 2012-04-12 DIAGNOSIS — I69998 Other sequelae following unspecified cerebrovascular disease: Secondary | ICD-10-CM | POA: Diagnosis not present

## 2012-04-12 DIAGNOSIS — M1710 Unilateral primary osteoarthritis, unspecified knee: Secondary | ICD-10-CM | POA: Diagnosis not present

## 2012-04-12 DIAGNOSIS — E1149 Type 2 diabetes mellitus with other diabetic neurological complication: Secondary | ICD-10-CM | POA: Diagnosis not present

## 2012-04-12 DIAGNOSIS — R269 Unspecified abnormalities of gait and mobility: Secondary | ICD-10-CM | POA: Diagnosis not present

## 2012-04-12 DIAGNOSIS — F329 Major depressive disorder, single episode, unspecified: Secondary | ICD-10-CM | POA: Diagnosis not present

## 2012-04-12 DIAGNOSIS — E1142 Type 2 diabetes mellitus with diabetic polyneuropathy: Secondary | ICD-10-CM | POA: Diagnosis not present

## 2012-04-16 DIAGNOSIS — I69998 Other sequelae following unspecified cerebrovascular disease: Secondary | ICD-10-CM | POA: Diagnosis not present

## 2012-04-16 DIAGNOSIS — E1149 Type 2 diabetes mellitus with other diabetic neurological complication: Secondary | ICD-10-CM | POA: Diagnosis not present

## 2012-04-16 DIAGNOSIS — E1142 Type 2 diabetes mellitus with diabetic polyneuropathy: Secondary | ICD-10-CM | POA: Diagnosis not present

## 2012-04-16 DIAGNOSIS — R269 Unspecified abnormalities of gait and mobility: Secondary | ICD-10-CM | POA: Diagnosis not present

## 2012-04-16 DIAGNOSIS — F329 Major depressive disorder, single episode, unspecified: Secondary | ICD-10-CM | POA: Diagnosis not present

## 2012-04-16 DIAGNOSIS — M1710 Unilateral primary osteoarthritis, unspecified knee: Secondary | ICD-10-CM | POA: Diagnosis not present

## 2012-04-17 DIAGNOSIS — M1710 Unilateral primary osteoarthritis, unspecified knee: Secondary | ICD-10-CM | POA: Diagnosis not present

## 2012-04-17 DIAGNOSIS — E1142 Type 2 diabetes mellitus with diabetic polyneuropathy: Secondary | ICD-10-CM | POA: Diagnosis not present

## 2012-04-17 DIAGNOSIS — I69998 Other sequelae following unspecified cerebrovascular disease: Secondary | ICD-10-CM | POA: Diagnosis not present

## 2012-04-17 DIAGNOSIS — R269 Unspecified abnormalities of gait and mobility: Secondary | ICD-10-CM | POA: Diagnosis not present

## 2012-04-17 DIAGNOSIS — F329 Major depressive disorder, single episode, unspecified: Secondary | ICD-10-CM | POA: Diagnosis not present

## 2012-04-17 DIAGNOSIS — E1149 Type 2 diabetes mellitus with other diabetic neurological complication: Secondary | ICD-10-CM | POA: Diagnosis not present

## 2012-04-18 DIAGNOSIS — R259 Unspecified abnormal involuntary movements: Secondary | ICD-10-CM | POA: Diagnosis not present

## 2012-04-18 DIAGNOSIS — R5381 Other malaise: Secondary | ICD-10-CM | POA: Diagnosis not present

## 2012-04-19 ENCOUNTER — Other Ambulatory Visit: Payer: Self-pay | Admitting: Internal Medicine

## 2012-04-19 DIAGNOSIS — I69998 Other sequelae following unspecified cerebrovascular disease: Secondary | ICD-10-CM | POA: Diagnosis not present

## 2012-04-19 DIAGNOSIS — M1710 Unilateral primary osteoarthritis, unspecified knee: Secondary | ICD-10-CM | POA: Diagnosis not present

## 2012-04-19 DIAGNOSIS — F329 Major depressive disorder, single episode, unspecified: Secondary | ICD-10-CM | POA: Diagnosis not present

## 2012-04-19 DIAGNOSIS — I639 Cerebral infarction, unspecified: Secondary | ICD-10-CM

## 2012-04-19 DIAGNOSIS — R269 Unspecified abnormalities of gait and mobility: Secondary | ICD-10-CM | POA: Diagnosis not present

## 2012-04-19 DIAGNOSIS — E1142 Type 2 diabetes mellitus with diabetic polyneuropathy: Secondary | ICD-10-CM | POA: Diagnosis not present

## 2012-04-19 DIAGNOSIS — E1149 Type 2 diabetes mellitus with other diabetic neurological complication: Secondary | ICD-10-CM | POA: Diagnosis not present

## 2012-04-22 ENCOUNTER — Ambulatory Visit
Admission: RE | Admit: 2012-04-22 | Discharge: 2012-04-22 | Disposition: A | Discharge status: Home / Self Care | Payer: Medicare Other | Source: Ambulatory Visit | Attending: Internal Medicine | Admitting: Internal Medicine

## 2012-04-22 DIAGNOSIS — G609 Hereditary and idiopathic neuropathy, unspecified: Secondary | ICD-10-CM | POA: Diagnosis not present

## 2012-04-22 DIAGNOSIS — G259 Extrapyramidal and movement disorder, unspecified: Secondary | ICD-10-CM | POA: Diagnosis not present

## 2012-04-22 DIAGNOSIS — R269 Unspecified abnormalities of gait and mobility: Secondary | ICD-10-CM | POA: Diagnosis not present

## 2012-04-22 DIAGNOSIS — K089 Disorder of teeth and supporting structures, unspecified: Secondary | ICD-10-CM | POA: Diagnosis not present

## 2012-04-22 DIAGNOSIS — I6789 Other cerebrovascular disease: Secondary | ICD-10-CM | POA: Diagnosis not present

## 2012-04-22 DIAGNOSIS — I639 Cerebral infarction, unspecified: Secondary | ICD-10-CM

## 2012-04-22 DIAGNOSIS — R42 Dizziness and giddiness: Secondary | ICD-10-CM | POA: Diagnosis not present

## 2012-04-22 DIAGNOSIS — D510 Vitamin B12 deficiency anemia due to intrinsic factor deficiency: Secondary | ICD-10-CM | POA: Diagnosis not present

## 2012-04-24 DIAGNOSIS — E1142 Type 2 diabetes mellitus with diabetic polyneuropathy: Secondary | ICD-10-CM | POA: Diagnosis not present

## 2012-04-24 DIAGNOSIS — F329 Major depressive disorder, single episode, unspecified: Secondary | ICD-10-CM | POA: Diagnosis not present

## 2012-04-24 DIAGNOSIS — I69998 Other sequelae following unspecified cerebrovascular disease: Secondary | ICD-10-CM | POA: Diagnosis not present

## 2012-04-24 DIAGNOSIS — E1149 Type 2 diabetes mellitus with other diabetic neurological complication: Secondary | ICD-10-CM | POA: Diagnosis not present

## 2012-04-24 DIAGNOSIS — R269 Unspecified abnormalities of gait and mobility: Secondary | ICD-10-CM | POA: Diagnosis not present

## 2012-04-24 DIAGNOSIS — M1710 Unilateral primary osteoarthritis, unspecified knee: Secondary | ICD-10-CM | POA: Diagnosis not present

## 2012-04-25 DIAGNOSIS — M1710 Unilateral primary osteoarthritis, unspecified knee: Secondary | ICD-10-CM | POA: Diagnosis not present

## 2012-04-25 DIAGNOSIS — E1149 Type 2 diabetes mellitus with other diabetic neurological complication: Secondary | ICD-10-CM | POA: Diagnosis not present

## 2012-04-25 DIAGNOSIS — R269 Unspecified abnormalities of gait and mobility: Secondary | ICD-10-CM | POA: Diagnosis not present

## 2012-04-25 DIAGNOSIS — I69998 Other sequelae following unspecified cerebrovascular disease: Secondary | ICD-10-CM | POA: Diagnosis not present

## 2012-04-25 DIAGNOSIS — F329 Major depressive disorder, single episode, unspecified: Secondary | ICD-10-CM | POA: Diagnosis not present

## 2012-04-25 DIAGNOSIS — E1142 Type 2 diabetes mellitus with diabetic polyneuropathy: Secondary | ICD-10-CM | POA: Diagnosis not present

## 2012-04-26 DIAGNOSIS — M1710 Unilateral primary osteoarthritis, unspecified knee: Secondary | ICD-10-CM | POA: Diagnosis not present

## 2012-04-26 DIAGNOSIS — R269 Unspecified abnormalities of gait and mobility: Secondary | ICD-10-CM | POA: Diagnosis not present

## 2012-04-26 DIAGNOSIS — F329 Major depressive disorder, single episode, unspecified: Secondary | ICD-10-CM | POA: Diagnosis not present

## 2012-04-26 DIAGNOSIS — E1149 Type 2 diabetes mellitus with other diabetic neurological complication: Secondary | ICD-10-CM | POA: Diagnosis not present

## 2012-04-26 DIAGNOSIS — I69998 Other sequelae following unspecified cerebrovascular disease: Secondary | ICD-10-CM | POA: Diagnosis not present

## 2012-04-26 DIAGNOSIS — E1142 Type 2 diabetes mellitus with diabetic polyneuropathy: Secondary | ICD-10-CM | POA: Diagnosis not present

## 2012-04-30 DIAGNOSIS — E1142 Type 2 diabetes mellitus with diabetic polyneuropathy: Secondary | ICD-10-CM | POA: Diagnosis not present

## 2012-04-30 DIAGNOSIS — M1710 Unilateral primary osteoarthritis, unspecified knee: Secondary | ICD-10-CM | POA: Diagnosis not present

## 2012-04-30 DIAGNOSIS — F329 Major depressive disorder, single episode, unspecified: Secondary | ICD-10-CM | POA: Diagnosis not present

## 2012-04-30 DIAGNOSIS — I69998 Other sequelae following unspecified cerebrovascular disease: Secondary | ICD-10-CM | POA: Diagnosis not present

## 2012-04-30 DIAGNOSIS — E1149 Type 2 diabetes mellitus with other diabetic neurological complication: Secondary | ICD-10-CM | POA: Diagnosis not present

## 2012-04-30 DIAGNOSIS — R269 Unspecified abnormalities of gait and mobility: Secondary | ICD-10-CM | POA: Diagnosis not present

## 2012-05-01 DIAGNOSIS — F329 Major depressive disorder, single episode, unspecified: Secondary | ICD-10-CM | POA: Diagnosis not present

## 2012-05-01 DIAGNOSIS — I69998 Other sequelae following unspecified cerebrovascular disease: Secondary | ICD-10-CM | POA: Diagnosis not present

## 2012-05-01 DIAGNOSIS — M1710 Unilateral primary osteoarthritis, unspecified knee: Secondary | ICD-10-CM | POA: Diagnosis not present

## 2012-05-01 DIAGNOSIS — E1142 Type 2 diabetes mellitus with diabetic polyneuropathy: Secondary | ICD-10-CM | POA: Diagnosis not present

## 2012-05-01 DIAGNOSIS — R269 Unspecified abnormalities of gait and mobility: Secondary | ICD-10-CM | POA: Diagnosis not present

## 2012-05-01 DIAGNOSIS — E1149 Type 2 diabetes mellitus with other diabetic neurological complication: Secondary | ICD-10-CM | POA: Diagnosis not present

## 2012-05-02 DIAGNOSIS — E1142 Type 2 diabetes mellitus with diabetic polyneuropathy: Secondary | ICD-10-CM | POA: Diagnosis not present

## 2012-05-02 DIAGNOSIS — F329 Major depressive disorder, single episode, unspecified: Secondary | ICD-10-CM | POA: Diagnosis not present

## 2012-05-02 DIAGNOSIS — E1149 Type 2 diabetes mellitus with other diabetic neurological complication: Secondary | ICD-10-CM | POA: Diagnosis not present

## 2012-05-02 DIAGNOSIS — I69998 Other sequelae following unspecified cerebrovascular disease: Secondary | ICD-10-CM | POA: Diagnosis not present

## 2012-05-02 DIAGNOSIS — M1710 Unilateral primary osteoarthritis, unspecified knee: Secondary | ICD-10-CM | POA: Diagnosis not present

## 2012-05-02 DIAGNOSIS — R269 Unspecified abnormalities of gait and mobility: Secondary | ICD-10-CM | POA: Diagnosis not present

## 2012-05-06 DIAGNOSIS — I10 Essential (primary) hypertension: Secondary | ICD-10-CM | POA: Diagnosis not present

## 2012-05-06 DIAGNOSIS — R269 Unspecified abnormalities of gait and mobility: Secondary | ICD-10-CM | POA: Diagnosis not present

## 2012-05-06 DIAGNOSIS — D510 Vitamin B12 deficiency anemia due to intrinsic factor deficiency: Secondary | ICD-10-CM | POA: Diagnosis not present

## 2012-05-06 DIAGNOSIS — K089 Disorder of teeth and supporting structures, unspecified: Secondary | ICD-10-CM | POA: Diagnosis not present

## 2012-05-06 DIAGNOSIS — G609 Hereditary and idiopathic neuropathy, unspecified: Secondary | ICD-10-CM | POA: Diagnosis not present

## 2012-05-06 DIAGNOSIS — G259 Extrapyramidal and movement disorder, unspecified: Secondary | ICD-10-CM | POA: Diagnosis not present

## 2012-05-08 DIAGNOSIS — R269 Unspecified abnormalities of gait and mobility: Secondary | ICD-10-CM | POA: Diagnosis not present

## 2012-05-08 DIAGNOSIS — E1142 Type 2 diabetes mellitus with diabetic polyneuropathy: Secondary | ICD-10-CM | POA: Diagnosis not present

## 2012-05-08 DIAGNOSIS — E1149 Type 2 diabetes mellitus with other diabetic neurological complication: Secondary | ICD-10-CM | POA: Diagnosis not present

## 2012-05-08 DIAGNOSIS — I69998 Other sequelae following unspecified cerebrovascular disease: Secondary | ICD-10-CM | POA: Diagnosis not present

## 2012-05-08 DIAGNOSIS — M1710 Unilateral primary osteoarthritis, unspecified knee: Secondary | ICD-10-CM | POA: Diagnosis not present

## 2012-05-08 DIAGNOSIS — F329 Major depressive disorder, single episode, unspecified: Secondary | ICD-10-CM | POA: Diagnosis not present

## 2012-05-10 DIAGNOSIS — E1149 Type 2 diabetes mellitus with other diabetic neurological complication: Secondary | ICD-10-CM | POA: Diagnosis not present

## 2012-05-10 DIAGNOSIS — E1142 Type 2 diabetes mellitus with diabetic polyneuropathy: Secondary | ICD-10-CM | POA: Diagnosis not present

## 2012-05-10 DIAGNOSIS — R269 Unspecified abnormalities of gait and mobility: Secondary | ICD-10-CM | POA: Diagnosis not present

## 2012-05-10 DIAGNOSIS — I69998 Other sequelae following unspecified cerebrovascular disease: Secondary | ICD-10-CM | POA: Diagnosis not present

## 2012-05-10 DIAGNOSIS — M1710 Unilateral primary osteoarthritis, unspecified knee: Secondary | ICD-10-CM | POA: Diagnosis not present

## 2012-05-10 DIAGNOSIS — F329 Major depressive disorder, single episode, unspecified: Secondary | ICD-10-CM | POA: Diagnosis not present

## 2012-05-29 DIAGNOSIS — E119 Type 2 diabetes mellitus without complications: Secondary | ICD-10-CM | POA: Diagnosis not present

## 2012-06-04 DIAGNOSIS — I10 Essential (primary) hypertension: Secondary | ICD-10-CM | POA: Diagnosis not present

## 2012-06-04 DIAGNOSIS — M109 Gout, unspecified: Secondary | ICD-10-CM | POA: Diagnosis not present

## 2012-06-04 DIAGNOSIS — R609 Edema, unspecified: Secondary | ICD-10-CM | POA: Diagnosis not present

## 2012-06-04 DIAGNOSIS — N2581 Secondary hyperparathyroidism of renal origin: Secondary | ICD-10-CM | POA: Diagnosis not present

## 2012-06-06 ENCOUNTER — Encounter: Payer: Self-pay | Admitting: Internal Medicine

## 2012-06-19 DIAGNOSIS — E1149 Type 2 diabetes mellitus with other diabetic neurological complication: Secondary | ICD-10-CM | POA: Diagnosis not present

## 2012-06-19 DIAGNOSIS — G909 Disorder of the autonomic nervous system, unspecified: Secondary | ICD-10-CM | POA: Diagnosis not present

## 2012-06-19 DIAGNOSIS — M19079 Primary osteoarthritis, unspecified ankle and foot: Secondary | ICD-10-CM | POA: Diagnosis not present

## 2012-06-19 DIAGNOSIS — B351 Tinea unguium: Secondary | ICD-10-CM | POA: Diagnosis not present

## 2012-08-07 DIAGNOSIS — M5137 Other intervertebral disc degeneration, lumbosacral region: Secondary | ICD-10-CM | POA: Diagnosis not present

## 2012-08-07 DIAGNOSIS — M5430 Sciatica, unspecified side: Secondary | ICD-10-CM | POA: Diagnosis not present

## 2012-08-07 DIAGNOSIS — S336XXA Sprain of sacroiliac joint, initial encounter: Secondary | ICD-10-CM | POA: Diagnosis not present

## 2012-08-07 DIAGNOSIS — M999 Biomechanical lesion, unspecified: Secondary | ICD-10-CM | POA: Diagnosis not present

## 2012-08-14 DIAGNOSIS — M5137 Other intervertebral disc degeneration, lumbosacral region: Secondary | ICD-10-CM | POA: Diagnosis not present

## 2012-08-14 DIAGNOSIS — S336XXA Sprain of sacroiliac joint, initial encounter: Secondary | ICD-10-CM | POA: Diagnosis not present

## 2012-08-14 DIAGNOSIS — M999 Biomechanical lesion, unspecified: Secondary | ICD-10-CM | POA: Diagnosis not present

## 2012-08-14 DIAGNOSIS — M5430 Sciatica, unspecified side: Secondary | ICD-10-CM | POA: Diagnosis not present

## 2012-08-15 DIAGNOSIS — S336XXA Sprain of sacroiliac joint, initial encounter: Secondary | ICD-10-CM | POA: Diagnosis not present

## 2012-08-15 DIAGNOSIS — M5137 Other intervertebral disc degeneration, lumbosacral region: Secondary | ICD-10-CM | POA: Diagnosis not present

## 2012-08-15 DIAGNOSIS — M5430 Sciatica, unspecified side: Secondary | ICD-10-CM | POA: Diagnosis not present

## 2012-08-15 DIAGNOSIS — M999 Biomechanical lesion, unspecified: Secondary | ICD-10-CM | POA: Diagnosis not present

## 2012-08-16 DIAGNOSIS — S336XXA Sprain of sacroiliac joint, initial encounter: Secondary | ICD-10-CM | POA: Diagnosis not present

## 2012-08-16 DIAGNOSIS — M999 Biomechanical lesion, unspecified: Secondary | ICD-10-CM | POA: Diagnosis not present

## 2012-08-16 DIAGNOSIS — M5430 Sciatica, unspecified side: Secondary | ICD-10-CM | POA: Diagnosis not present

## 2012-08-16 DIAGNOSIS — M5137 Other intervertebral disc degeneration, lumbosacral region: Secondary | ICD-10-CM | POA: Diagnosis not present

## 2012-08-20 DIAGNOSIS — M999 Biomechanical lesion, unspecified: Secondary | ICD-10-CM | POA: Diagnosis not present

## 2012-08-20 DIAGNOSIS — Z23 Encounter for immunization: Secondary | ICD-10-CM | POA: Diagnosis not present

## 2012-08-20 DIAGNOSIS — M5430 Sciatica, unspecified side: Secondary | ICD-10-CM | POA: Diagnosis not present

## 2012-08-20 DIAGNOSIS — M5137 Other intervertebral disc degeneration, lumbosacral region: Secondary | ICD-10-CM | POA: Diagnosis not present

## 2012-08-20 DIAGNOSIS — S336XXA Sprain of sacroiliac joint, initial encounter: Secondary | ICD-10-CM | POA: Diagnosis not present

## 2012-08-21 DIAGNOSIS — S336XXA Sprain of sacroiliac joint, initial encounter: Secondary | ICD-10-CM | POA: Diagnosis not present

## 2012-08-21 DIAGNOSIS — M5137 Other intervertebral disc degeneration, lumbosacral region: Secondary | ICD-10-CM | POA: Diagnosis not present

## 2012-08-21 DIAGNOSIS — M999 Biomechanical lesion, unspecified: Secondary | ICD-10-CM | POA: Diagnosis not present

## 2012-08-21 DIAGNOSIS — M5430 Sciatica, unspecified side: Secondary | ICD-10-CM | POA: Diagnosis not present

## 2012-08-22 DIAGNOSIS — S336XXA Sprain of sacroiliac joint, initial encounter: Secondary | ICD-10-CM | POA: Diagnosis not present

## 2012-08-22 DIAGNOSIS — M5137 Other intervertebral disc degeneration, lumbosacral region: Secondary | ICD-10-CM | POA: Diagnosis not present

## 2012-08-22 DIAGNOSIS — M999 Biomechanical lesion, unspecified: Secondary | ICD-10-CM | POA: Diagnosis not present

## 2012-08-22 DIAGNOSIS — M5430 Sciatica, unspecified side: Secondary | ICD-10-CM | POA: Diagnosis not present

## 2012-08-27 DIAGNOSIS — M5430 Sciatica, unspecified side: Secondary | ICD-10-CM | POA: Diagnosis not present

## 2012-08-27 DIAGNOSIS — S336XXA Sprain of sacroiliac joint, initial encounter: Secondary | ICD-10-CM | POA: Diagnosis not present

## 2012-08-27 DIAGNOSIS — M999 Biomechanical lesion, unspecified: Secondary | ICD-10-CM | POA: Diagnosis not present

## 2012-08-27 DIAGNOSIS — M5137 Other intervertebral disc degeneration, lumbosacral region: Secondary | ICD-10-CM | POA: Diagnosis not present

## 2012-08-29 DIAGNOSIS — M5137 Other intervertebral disc degeneration, lumbosacral region: Secondary | ICD-10-CM | POA: Diagnosis not present

## 2012-08-29 DIAGNOSIS — S336XXA Sprain of sacroiliac joint, initial encounter: Secondary | ICD-10-CM | POA: Diagnosis not present

## 2012-08-29 DIAGNOSIS — M999 Biomechanical lesion, unspecified: Secondary | ICD-10-CM | POA: Diagnosis not present

## 2012-08-29 DIAGNOSIS — M5430 Sciatica, unspecified side: Secondary | ICD-10-CM | POA: Diagnosis not present

## 2012-09-10 DIAGNOSIS — M5137 Other intervertebral disc degeneration, lumbosacral region: Secondary | ICD-10-CM | POA: Diagnosis not present

## 2012-09-10 DIAGNOSIS — S336XXA Sprain of sacroiliac joint, initial encounter: Secondary | ICD-10-CM | POA: Diagnosis not present

## 2012-09-10 DIAGNOSIS — M5430 Sciatica, unspecified side: Secondary | ICD-10-CM | POA: Diagnosis not present

## 2012-09-10 DIAGNOSIS — M999 Biomechanical lesion, unspecified: Secondary | ICD-10-CM | POA: Diagnosis not present

## 2012-09-12 DIAGNOSIS — M5137 Other intervertebral disc degeneration, lumbosacral region: Secondary | ICD-10-CM | POA: Diagnosis not present

## 2012-09-12 DIAGNOSIS — M999 Biomechanical lesion, unspecified: Secondary | ICD-10-CM | POA: Diagnosis not present

## 2012-09-12 DIAGNOSIS — M5430 Sciatica, unspecified side: Secondary | ICD-10-CM | POA: Diagnosis not present

## 2012-09-12 DIAGNOSIS — S336XXA Sprain of sacroiliac joint, initial encounter: Secondary | ICD-10-CM | POA: Diagnosis not present

## 2012-09-19 DIAGNOSIS — R259 Unspecified abnormal involuntary movements: Secondary | ICD-10-CM | POA: Diagnosis not present

## 2012-09-19 DIAGNOSIS — D510 Vitamin B12 deficiency anemia due to intrinsic factor deficiency: Secondary | ICD-10-CM | POA: Diagnosis not present

## 2012-09-19 DIAGNOSIS — K089 Disorder of teeth and supporting structures, unspecified: Secondary | ICD-10-CM | POA: Diagnosis not present

## 2012-09-19 DIAGNOSIS — G609 Hereditary and idiopathic neuropathy, unspecified: Secondary | ICD-10-CM | POA: Diagnosis not present

## 2012-09-19 DIAGNOSIS — E1349 Other specified diabetes mellitus with other diabetic neurological complication: Secondary | ICD-10-CM | POA: Diagnosis not present

## 2012-12-03 DIAGNOSIS — K089 Disorder of teeth and supporting structures, unspecified: Secondary | ICD-10-CM | POA: Diagnosis not present

## 2012-12-03 DIAGNOSIS — D510 Vitamin B12 deficiency anemia due to intrinsic factor deficiency: Secondary | ICD-10-CM | POA: Diagnosis not present

## 2012-12-03 DIAGNOSIS — R259 Unspecified abnormal involuntary movements: Secondary | ICD-10-CM | POA: Diagnosis not present

## 2012-12-03 DIAGNOSIS — E1349 Other specified diabetes mellitus with other diabetic neurological complication: Secondary | ICD-10-CM | POA: Diagnosis not present

## 2012-12-03 DIAGNOSIS — G609 Hereditary and idiopathic neuropathy, unspecified: Secondary | ICD-10-CM | POA: Diagnosis not present

## 2013-01-29 DIAGNOSIS — L03119 Cellulitis of unspecified part of limb: Secondary | ICD-10-CM | POA: Diagnosis not present

## 2013-02-10 DIAGNOSIS — L03119 Cellulitis of unspecified part of limb: Secondary | ICD-10-CM | POA: Diagnosis not present

## 2013-02-10 DIAGNOSIS — L02419 Cutaneous abscess of limb, unspecified: Secondary | ICD-10-CM | POA: Diagnosis not present

## 2013-02-10 DIAGNOSIS — E669 Obesity, unspecified: Secondary | ICD-10-CM | POA: Diagnosis not present

## 2013-04-04 ENCOUNTER — Encounter (HOSPITAL_COMMUNITY): Payer: Self-pay | Admitting: *Deleted

## 2013-04-04 ENCOUNTER — Inpatient Hospital Stay (HOSPITAL_COMMUNITY)
Admission: EM | Admit: 2013-04-04 | Discharge: 2013-04-08 | DRG: 603 | Disposition: A | Discharge status: Skilled Nursing Facility | Payer: Medicare Other | Attending: Internal Medicine | Admitting: Internal Medicine

## 2013-04-04 ENCOUNTER — Emergency Department (HOSPITAL_COMMUNITY): Payer: Medicare Other

## 2013-04-04 DIAGNOSIS — I872 Venous insufficiency (chronic) (peripheral): Secondary | ICD-10-CM | POA: Diagnosis present

## 2013-04-04 DIAGNOSIS — IMO0001 Reserved for inherently not codable concepts without codable children: Secondary | ICD-10-CM | POA: Diagnosis present

## 2013-04-04 DIAGNOSIS — L03119 Cellulitis of unspecified part of limb: Secondary | ICD-10-CM | POA: Diagnosis not present

## 2013-04-04 DIAGNOSIS — E7800 Pure hypercholesterolemia, unspecified: Secondary | ICD-10-CM | POA: Diagnosis present

## 2013-04-04 DIAGNOSIS — N179 Acute kidney failure, unspecified: Secondary | ICD-10-CM

## 2013-04-04 DIAGNOSIS — Z8679 Personal history of other diseases of the circulatory system: Secondary | ICD-10-CM | POA: Diagnosis not present

## 2013-04-04 DIAGNOSIS — G4700 Insomnia, unspecified: Secondary | ICD-10-CM | POA: Diagnosis not present

## 2013-04-04 DIAGNOSIS — L02419 Cutaneous abscess of limb, unspecified: Secondary | ICD-10-CM | POA: Diagnosis not present

## 2013-04-04 DIAGNOSIS — Z8673 Personal history of transient ischemic attack (TIA), and cerebral infarction without residual deficits: Secondary | ICD-10-CM

## 2013-04-04 DIAGNOSIS — N289 Disorder of kidney and ureter, unspecified: Secondary | ICD-10-CM | POA: Diagnosis present

## 2013-04-04 DIAGNOSIS — L03116 Cellulitis of left lower limb: Secondary | ICD-10-CM

## 2013-04-04 DIAGNOSIS — K863 Pseudocyst of pancreas: Secondary | ICD-10-CM | POA: Diagnosis not present

## 2013-04-04 DIAGNOSIS — L02619 Cutaneous abscess of unspecified foot: Secondary | ICD-10-CM | POA: Diagnosis not present

## 2013-04-04 DIAGNOSIS — L0390 Cellulitis, unspecified: Secondary | ICD-10-CM | POA: Diagnosis not present

## 2013-04-04 DIAGNOSIS — M6281 Muscle weakness (generalized): Secondary | ICD-10-CM | POA: Diagnosis not present

## 2013-04-04 DIAGNOSIS — F17200 Nicotine dependence, unspecified, uncomplicated: Secondary | ICD-10-CM | POA: Diagnosis not present

## 2013-04-04 DIAGNOSIS — Z794 Long term (current) use of insulin: Secondary | ICD-10-CM | POA: Diagnosis not present

## 2013-04-04 DIAGNOSIS — E785 Hyperlipidemia, unspecified: Secondary | ICD-10-CM | POA: Diagnosis present

## 2013-04-04 DIAGNOSIS — E119 Type 2 diabetes mellitus without complications: Secondary | ICD-10-CM | POA: Diagnosis not present

## 2013-04-04 DIAGNOSIS — I6789 Other cerebrovascular disease: Secondary | ICD-10-CM | POA: Diagnosis not present

## 2013-04-04 DIAGNOSIS — K862 Cyst of pancreas: Secondary | ICD-10-CM | POA: Diagnosis not present

## 2013-04-04 DIAGNOSIS — Z6836 Body mass index (BMI) 36.0-36.9, adult: Secondary | ICD-10-CM | POA: Diagnosis not present

## 2013-04-04 DIAGNOSIS — E1149 Type 2 diabetes mellitus with other diabetic neurological complication: Secondary | ICD-10-CM | POA: Diagnosis not present

## 2013-04-04 DIAGNOSIS — I10 Essential (primary) hypertension: Secondary | ICD-10-CM | POA: Diagnosis present

## 2013-04-04 DIAGNOSIS — R609 Edema, unspecified: Secondary | ICD-10-CM | POA: Diagnosis present

## 2013-04-04 DIAGNOSIS — R1031 Right lower quadrant pain: Secondary | ICD-10-CM | POA: Diagnosis not present

## 2013-04-04 DIAGNOSIS — M25559 Pain in unspecified hip: Secondary | ICD-10-CM | POA: Diagnosis present

## 2013-04-04 DIAGNOSIS — R262 Difficulty in walking, not elsewhere classified: Secondary | ICD-10-CM | POA: Diagnosis not present

## 2013-04-04 DIAGNOSIS — N189 Chronic kidney disease, unspecified: Secondary | ICD-10-CM | POA: Diagnosis not present

## 2013-04-04 DIAGNOSIS — E1142 Type 2 diabetes mellitus with diabetic polyneuropathy: Secondary | ICD-10-CM | POA: Diagnosis not present

## 2013-04-04 DIAGNOSIS — M169 Osteoarthritis of hip, unspecified: Secondary | ICD-10-CM | POA: Diagnosis not present

## 2013-04-04 DIAGNOSIS — M25551 Pain in right hip: Secondary | ICD-10-CM

## 2013-04-04 DIAGNOSIS — R109 Unspecified abdominal pain: Secondary | ICD-10-CM | POA: Diagnosis not present

## 2013-04-04 DIAGNOSIS — L03115 Cellulitis of right lower limb: Secondary | ICD-10-CM

## 2013-04-04 DIAGNOSIS — L0291 Cutaneous abscess, unspecified: Secondary | ICD-10-CM | POA: Diagnosis not present

## 2013-04-04 DIAGNOSIS — K219 Gastro-esophageal reflux disease without esophagitis: Secondary | ICD-10-CM | POA: Diagnosis not present

## 2013-04-04 DIAGNOSIS — M79609 Pain in unspecified limb: Secondary | ICD-10-CM | POA: Diagnosis not present

## 2013-04-04 DIAGNOSIS — R6889 Other general symptoms and signs: Secondary | ICD-10-CM | POA: Diagnosis not present

## 2013-04-04 LAB — CBC WITH DIFFERENTIAL/PLATELET
Basophils Absolute: 0 10*3/uL (ref 0.0–0.1)
Basophils Relative: 0 % (ref 0–1)
Eosinophils Absolute: 0.3 10*3/uL (ref 0.0–0.7)
Hemoglobin: 13.2 g/dL (ref 13.0–17.0)
Lymphocytes Relative: 14 % (ref 12–46)
MCHC: 32.4 g/dL (ref 30.0–36.0)
Neutrophils Relative %: 75 % (ref 43–77)
RDW: 15.6 % — ABNORMAL HIGH (ref 11.5–15.5)

## 2013-04-04 LAB — GLUCOSE, CAPILLARY
Glucose-Capillary: 99 mg/dL (ref 70–99)
Glucose-Capillary: 111 mg/dL — ABNORMAL HIGH (ref 70–99)
Glucose-Capillary: 156 mg/dL — ABNORMAL HIGH (ref 70–99)

## 2013-04-04 LAB — URINALYSIS, ROUTINE W REFLEX MICROSCOPIC
Bilirubin Urine: NEGATIVE
Hgb urine dipstick: NEGATIVE
Nitrite: NEGATIVE
Specific Gravity, Urine: 1.007 (ref 1.005–1.030)
pH: 7.5 (ref 5.0–8.0)

## 2013-04-04 LAB — COMPREHENSIVE METABOLIC PANEL
ALT: 21 U/L (ref 0–53)
AST: 26 U/L (ref 0–37)
Albumin: 3.7 g/dL (ref 3.5–5.2)
Alkaline Phosphatase: 73 U/L (ref 39–117)
Potassium: 4.3 mEq/L (ref 3.5–5.1)
Sodium: 142 mEq/L (ref 135–145)
Total Protein: 7.1 g/dL (ref 6.0–8.3)

## 2013-04-04 LAB — CG4 I-STAT (LACTIC ACID): Lactic Acid, Venous: 0.77 mmol/L (ref 0.5–2.2)

## 2013-04-04 MED ORDER — DOCUSATE SODIUM 100 MG PO CAPS
100.0000 mg | ORAL_CAPSULE | Freq: Two times a day (BID) | ORAL | Status: DC
  Administered 2013-04-04 – 2013-04-08 (×8): 100 mg via ORAL
  Filled 2013-04-04 (×9): qty 1

## 2013-04-04 MED ORDER — ONDANSETRON HCL 4 MG/2ML IJ SOLN: 4.0000 mg | Freq: Four times a day (QID) | INTRAMUSCULAR | Status: DC | PRN

## 2013-04-04 MED ORDER — HYDROCODONE-ACETAMINOPHEN 5-500 MG PO TABS: 2.0000 | ORAL_TABLET | ORAL | Status: DC | PRN

## 2013-04-04 MED ORDER — VANCOMYCIN HCL IN DEXTROSE 1-5 GM/200ML-% IV SOLN
1000.0000 mg | Freq: Once | INTRAVENOUS | Status: AC
  Administered 2013-04-04: 1000 mg via INTRAVENOUS
  Filled 2013-04-04: qty 200

## 2013-04-04 MED ORDER — HYDROMORPHONE HCL PF 1 MG/ML IJ SOLN: 0.5000 mg | INTRAMUSCULAR | Status: AC | PRN

## 2013-04-04 MED ORDER — VITAMIN B-12 1000 MCG PO TABS
1000.0000 ug | ORAL_TABLET | Freq: Every day | ORAL | Status: DC
  Administered 2013-04-04 – 2013-04-08 (×5): 1000 ug via ORAL
  Filled 2013-04-04 (×5): qty 1

## 2013-04-04 MED ORDER — ENOXAPARIN SODIUM 40 MG/0.4ML ~~LOC~~ SOLN
40.0000 mg | SUBCUTANEOUS | Status: DC
  Administered 2013-04-04 – 2013-04-07 (×4): 40 mg via SUBCUTANEOUS
  Filled 2013-04-04 (×5): qty 0.4

## 2013-04-04 MED ORDER — ACETAMINOPHEN 650 MG RE SUPP: 650.0000 mg | Freq: Four times a day (QID) | RECTAL | Status: DC | PRN

## 2013-04-04 MED ORDER — TERAZOSIN HCL 2 MG PO CAPS
4.0000 mg | ORAL_CAPSULE | Freq: Every day | ORAL | Status: DC
  Administered 2013-04-04 – 2013-04-08 (×5): 4 mg via ORAL
  Filled 2013-04-04 (×5): qty 2

## 2013-04-04 MED ORDER — INSULIN GLARGINE 100 UNIT/ML ~~LOC~~ SOLN
60.0000 [IU] | Freq: Every day | SUBCUTANEOUS | Status: DC
  Administered 2013-04-04 – 2013-04-07 (×4): 60 [IU] via SUBCUTANEOUS
  Filled 2013-04-04 (×6): qty 0.6

## 2013-04-04 MED ORDER — INSULIN ASPART 100 UNIT/ML ~~LOC~~ SOLN
0.0000 [IU] | Freq: Three times a day (TID) | SUBCUTANEOUS | Status: DC
  Administered 2013-04-05 – 2013-04-06 (×3): 3 [IU] via SUBCUTANEOUS
  Administered 2013-04-06: 5 [IU] via SUBCUTANEOUS
  Administered 2013-04-07: 18:00:00 via SUBCUTANEOUS
  Administered 2013-04-07: 3 [IU] via SUBCUTANEOUS
  Administered 2013-04-07: 5 [IU] via SUBCUTANEOUS
  Administered 2013-04-08: 3 [IU] via SUBCUTANEOUS

## 2013-04-04 MED ORDER — ACETAMINOPHEN 325 MG PO TABS
650.0000 mg | ORAL_TABLET | Freq: Four times a day (QID) | ORAL | Status: DC | PRN
  Administered 2013-04-08: 650 mg via ORAL
  Filled 2013-04-04: qty 2

## 2013-04-04 MED ORDER — ZOLPIDEM TARTRATE 5 MG PO TABS
5.0000 mg | ORAL_TABLET | Freq: Every evening | ORAL | Status: DC | PRN
  Administered 2013-04-06: 5 mg via ORAL
  Filled 2013-04-04: qty 1

## 2013-04-04 MED ORDER — PRIMIDONE 50 MG PO TABS
25.0000 mg | ORAL_TABLET | Freq: Two times a day (BID) | ORAL | Status: DC
  Administered 2013-04-04 – 2013-04-08 (×8): 25 mg via ORAL
  Filled 2013-04-04 (×9): qty 0.5

## 2013-04-04 MED ORDER — SODIUM CHLORIDE 0.9 % IV SOLN
INTRAVENOUS | Status: DC
  Administered 2013-04-05: 07:00:00 via INTRAVENOUS

## 2013-04-04 MED ORDER — SODIUM CHLORIDE 0.9 % IV SOLN
INTRAVENOUS | Status: AC
  Administered 2013-04-04: 18:00:00 via INTRAVENOUS

## 2013-04-04 MED ORDER — VANCOMYCIN HCL 10 G IV SOLR
1500.0000 mg | INTRAVENOUS | Status: DC
  Administered 2013-04-04 – 2013-04-07 (×4): 1500 mg via INTRAVENOUS
  Filled 2013-04-04 (×5): qty 1500

## 2013-04-04 MED ORDER — SODIUM CHLORIDE 0.9 % IV BOLUS (SEPSIS)
2000.0000 mL | Freq: Once | INTRAVENOUS | Status: AC
  Administered 2013-04-04: 2000 mL via INTRAVENOUS

## 2013-04-04 MED ORDER — ASPIRIN EC 81 MG PO TBEC
81.0000 mg | DELAYED_RELEASE_TABLET | Freq: Every day | ORAL | Status: DC
  Administered 2013-04-04 – 2013-04-08 (×5): 81 mg via ORAL
  Filled 2013-04-04 (×5): qty 1

## 2013-04-04 MED ORDER — MORPHINE SULFATE 4 MG/ML IJ SOLN
4.0000 mg | INTRAMUSCULAR | Status: DC | PRN
  Administered 2013-04-04 – 2013-04-07 (×6): 4 mg via INTRAVENOUS
  Filled 2013-04-04 (×6): qty 1

## 2013-04-04 MED ORDER — IOHEXOL 300 MG/ML  SOLN
25.0000 mL | INTRAMUSCULAR | Status: AC
  Administered 2013-04-04: 25 mL via ORAL

## 2013-04-04 MED ORDER — SODIUM CHLORIDE 0.9 % IV SOLN
INTRAVENOUS | Status: DC
  Administered 2013-04-04 (×2): via INTRAVENOUS

## 2013-04-04 MED ORDER — ONDANSETRON HCL 4 MG PO TABS: 4.0000 mg | ORAL_TABLET | Freq: Four times a day (QID) | ORAL | Status: DC | PRN

## 2013-04-04 MED ORDER — SODIUM CHLORIDE 0.9 % IV BOLUS (SEPSIS)
1000.0000 mL | Freq: Once | INTRAVENOUS | Status: AC
  Administered 2013-04-04: 1000 mL via INTRAVENOUS

## 2013-04-04 MED ORDER — PANTOPRAZOLE SODIUM 40 MG PO TBEC
40.0000 mg | DELAYED_RELEASE_TABLET | Freq: Every day | ORAL | Status: DC
  Administered 2013-04-04 – 2013-04-08 (×5): 40 mg via ORAL
  Filled 2013-04-04 (×5): qty 1

## 2013-04-04 MED ORDER — IOHEXOL 300 MG/ML  SOLN
100.0000 mL | Freq: Once | INTRAMUSCULAR | Status: AC | PRN
  Administered 2013-04-04: 100 mL via INTRAVENOUS

## 2013-04-04 MED ORDER — PREGABALIN 50 MG PO CAPS
50.0000 mg | ORAL_CAPSULE | Freq: Two times a day (BID) | ORAL | Status: DC
  Administered 2013-04-04 – 2013-04-08 (×8): 50 mg via ORAL
  Filled 2013-04-04 (×8): qty 1

## 2013-04-04 MED ORDER — HYDROCODONE-ACETAMINOPHEN 10-325 MG PO TABS
1.0000 | ORAL_TABLET | Freq: Three times a day (TID) | ORAL | Status: DC | PRN
  Administered 2013-04-06 – 2013-04-08 (×4): 1 via ORAL
  Filled 2013-04-04 (×4): qty 1

## 2013-04-04 MED ORDER — SIMVASTATIN 20 MG PO TABS
20.0000 mg | ORAL_TABLET | Freq: Every evening | ORAL | Status: DC
  Administered 2013-04-04 – 2013-04-07 (×4): 20 mg via ORAL
  Filled 2013-04-04 (×5): qty 1

## 2013-04-04 MED ORDER — SENNA 8.6 MG PO TABS
2.0000 | ORAL_TABLET | Freq: Two times a day (BID) | ORAL | Status: DC
  Administered 2013-04-04 – 2013-04-08 (×8): 17.2 mg via ORAL
  Filled 2013-04-04 (×9): qty 2

## 2013-04-04 MED ORDER — BUPROPION HCL ER (SR) 150 MG PO TB12
150.0000 mg | ORAL_TABLET | Freq: Every day | ORAL | Status: DC
  Administered 2013-04-04 – 2013-04-08 (×5): 150 mg via ORAL
  Filled 2013-04-04 (×5): qty 1

## 2013-04-04 MED ORDER — HYDROMORPHONE HCL PF 1 MG/ML IJ SOLN
1.0000 mg | Freq: Once | INTRAMUSCULAR | Status: AC
  Administered 2013-04-04: 1 mg via INTRAVENOUS
  Filled 2013-04-04: qty 1

## 2013-04-04 MED ORDER — CLOPIDOGREL BISULFATE 75 MG PO TABS
75.0000 mg | ORAL_TABLET | Freq: Every day | ORAL | Status: DC
  Administered 2013-04-04 – 2013-04-08 (×5): 75 mg via ORAL
  Filled 2013-04-04 (×5): qty 1

## 2013-04-04 NOTE — ED Notes (Signed)
Lactic acid results shown to Dr. Bednar 

## 2013-04-04 NOTE — H&P (Signed)
[facility] History and Physical  Jared Hall ZOX:[id number] DOB: January 25, 1931 DOA: 04/04/2013   PCP: No primary provider on file.   Chief Complaint: leg edema and pain, right hip pain  HPI:  79 year old male with a history of diabetes mellitus type 2, stroke, chronic abdominal pain secondary to umbilical hernia, small bowel obstruction, PSVT, dyslipidemia presents with 3 day history of worsening atraumatic right hip pain. The patient denies any recent falls or injuries. He states that his pain in his right hip has worsened progressively over the last 3 days. He is able to stand on it but has significant pain. The patient, amylase with a walker. In addition, the patient also complains of worsening edema and erythema of the lower extremities, left greater than right. The patient states that he went to see his primary care physician, Dr. Kevan Ny, approximately 6 weeks agoat which time, the patient was given a ten-day course of antibiotics. There was some improvement of the erythema. However it worsened again. The patient was placed on a second ten-day course of antibiotics. Again there was improvement. However in the past week he has noted increasing edema and erythema of his extremities. He has noted "water blisters" that have erupted draining clear fluid. He has subjective chills without any fevers. He denies any chest discomfort, chest pain, nausea, vomiting, diarrhea, dysuria, hematuria. He states that he is somewhat short of breath with exertion, but this has been unchanged. He states that he has had dyspnea on exertion for the past 6 months. He also complains of right lower quadrant abdominal pain for the past week. He had his last bowel movement yesterday.  In the emergency department, the patient had x-ray of the right hip which did not reveal any acute fractures. CT of the abdomen and pelvis revealed a cystic lesion in the pancreatic body consistent with a pseudocyst without other acute findings.  He did suggest constipation. The patient is afebrile and hemodynamically stable. He was given vancomycin. WBC was 10.0. BMP showed renal insufficiency with a serum creatinine of 1.39. Assessment/Plan: Cellulitis left lower extremity -Has failed outpatient antibiotics -Start IV vancomycin -Blood cultures have been obtained -venous Dopplers to rule out DVT -Some of the patient's discoloration is due to chronic venous stasis particularly in the right lower extremity. -judicious IV fluids x1 L Abdominal pain -CT of the abdomen and pelvis without any acute findings -Suspect constipation -Start the patient on Colace and Senokot Right hip pain -MRI of right hip without gadolinium Diabetes mellitus type 2 -Start patient on half of his home dose of insulin as the patient may require procedures -NovoLog sliding scale -Check hemoglobin A1c AKI -Baseline creatinine 0.9-1.0 -Discontinue patient's furosemide at this time -Give patient judicious normal saline -Recheck morning BMP Hyperlipidemia -Continue Zocor        Past Medical History  Diagnosis Date  . Umbilical hernia   . Diabetes mellitus   . Cancer    Past Surgical History  Procedure Laterality Date  . Ventral hernia repair  12/18/2011    Procedure: HERNIA REPAIR VENTRAL ADULT;  Surgeon: Ernestene Mention, MD;  Location: [facility] OR;  Service: General;  Laterality: N/A;   Social History:  reports that he has never smoked. He has never used smokeless tobacco. He reports that he does not drink alcohol or use illicit drugs.   Family history reviewed: no pertinent family history  No Known Allergies    Prior to Admission medications   Medication Sig Start Date End Date Taking? Authorizing  Provider  aspirin EC 81 MG tablet Take 81 mg by mouth daily.   Yes Historical Provider, MD  B Complex Vitamins (VITAMIN B COMPLEX PO) Take 1 tablet by mouth daily.   Yes Historical Provider, MD  buPROPion (WELLBUTRIN SR) 150 MG 12 hr tablet Take  150 mg by mouth daily.   Yes Historical Provider, MD  clopidogrel (PLAVIX) 75 MG tablet Take 75 mg by mouth daily.   Yes Historical Provider, MD  clotrimazole-betamethasone (LOTRISONE) cream Apply 1 application topically 2 (two) times daily as needed (to affected areas of scalp).   Yes Historical Provider, MD  furosemide (LASIX) 80 MG tablet Take 40 mg by mouth daily.   Yes Historical Provider, MD  HYDROcodone-acetaminophen (VICODIN) 5-500 MG per tablet Take 2 tablets by mouth 3 (three) times daily.    Yes Historical Provider, MD  insulin glargine (LANTUS) 100 UNIT/ML injection Inject 60 Units into the skin 2 (two) times daily.   Yes Historical Provider, MD  pantoprazole (PROTONIX) 40 MG tablet Take 40 mg by mouth daily.   Yes Historical Provider, MD  potassium chloride SA (K-DUR,KLOR-CON) 20 MEQ tablet Take 20 mEq by mouth daily.   Yes Historical Provider, MD  pregabalin (LYRICA) 50 MG capsule Take 50 mg by mouth 2 (two) times daily.   Yes Historical Provider, MD  primidone (MYSOLINE) 50 MG tablet Take 25 mg by mouth 2 (two) times daily.   Yes Historical Provider, MD  simvastatin (ZOCOR) 20 MG tablet Take 20 mg by mouth every evening.   Yes Historical Provider, MD  terazosin (HYTRIN) 2 MG capsule Take 4 mg by mouth daily.   Yes Historical Provider, MD  vitamin B-12 (CYANOCOBALAMIN) 1000 MCG tablet Take 1,000 mcg by mouth daily.   Yes Historical Provider, MD  zolpidem (AMBIEN) 10 MG tablet Take 10 mg by mouth at bedtime as needed. For insomnia   Yes Historical Provider, MD    Review of Systems:  Constitutional:  No weight loss, night sweats,  fatigue.  Head&Eyes: No headache.  No vision loss.  No eye pain or scotoma ENT:  No Difficulty swallowing,Tooth/dental problems,Sore throat,   Cardio-vascular:  No chest pain, Orthopnea, PND,dizziness, palpitations  GI:  No  abdominal pain, nausea, vomiting, diarrhea, loss of appetite, hematochezia, melena, heartburn, indigestion, Resp:  No  shortness of breath with exertion or at rest. No cough. No coughing up of blood . Skin:  no rash or lesions.  GU:  no dysuria, change in color of urine, no urgency or frequency. No flank pain.  Musculoskeletal:  Complains of right hip pain without trauma. No back pain.  Psych:  No change in mood or affect. No depression or anxiety. Neurologic: No headache, no dysesthesia, no focal weakness, no vision loss. No syncope  Physical Exam: Filed Vitals:   04/04/13 1138 04/04/13 1157 04/04/13 1403 04/04/13 1554  BP:  176/85 174/86 163/81  Pulse:  84 88 88  Temp:    97.8 F (36.6 C)  TempSrc:    Oral  Resp:  22 20 21   Height:      Weight:      SpO2: 100% 100% 99% 100%   General:  A&O x 3, NAD, nontoxic, pleasant/cooperative Head/Eye: No conjunctival hemorrhage, no icterus, [place]/AT, No nystagmus ENT:  No icterus,  No thrush, good dentition, no pharyngeal exudate Neck:  No masses, no lymphadenpathy, no bruits CV:  RRR, no rub, no gallop, no S3 Lung:  CTAB, good air movement, no wheeze, no rhonchi Abdomen: soft/NT, +BS,  nondistended, no peritoneal signs Ext: No cyanosis, No rashes, No petechiae,3+lower extremity edema, left greater than right;erythema extending from the patient's dorsal foot to the distal third of the pretibial area on the left lower extremity without crepitance or necrosis--fluid blister noted on the calf of the left lower extremity without any purulent drainage or necrosis; bilateral venous stasis changes with lichenification of the bilateral pretibial areas   Labs on Admission:  Basic Metabolic Panel:  Recent Labs Lab 04/04/13 1140  NA 142  K 4.3  CL 106  CO2 29  GLUCOSE 113*  BUN 18  CREATININE 1.33  CALCIUM 9.8   Liver Function Tests:  Recent Labs Lab 04/04/13 1140  AST 26  ALT 21  ALKPHOS 73  BILITOT 0.4  PROT 7.1  ALBUMIN 3.7    Recent Labs Lab 04/04/13 1140  LIPASE 28   No results found for this basename: AMMONIA,  in the last 168  hours CBC:  Recent Labs Lab 04/04/13 1140  WBC 10.0  NEUTROABS 7.5  HGB 13.2  HCT 40.8  MCV 67.1*  PLT 193   Cardiac Enzymes: No results found for this basename: CKTOTAL, CKMB, CKMBINDEX, TROPONINI,  in the last 168 hours BNP: No components found with this basename: POCBNP,  CBG:  Recent Labs Lab 04/04/13 1118  GLUCAP 99    Radiological Exams on Admission: Dg Hip Complete Right  04/04/2013   *RADIOLOGY REPORT*  Clinical Data: Right hip pain without trauma  RIGHT HIP - COMPLETE 2+ VIEW  Comparison: None  Findings: Negative for fracture.  Mild cartilage loss in the medial acetabulum.  Negative for AVN.  No focal bony lesion.  IMPRESSION: No acute abnormality.   Original Report Authenticated By: Janeece Riggers, M.D.   Ct Abdomen Pelvis W Contrast  04/04/2013   *RADIOLOGY REPORT*  Clinical Data: Right lower quadrant pain.  Sweating.  Chills. History of umbilical hernia repair and prostate cancer.  Diabetes.  CT ABDOMEN AND PELVIS WITH CONTRAST  Technique:  Multidetector CT imaging of the abdomen and pelvis was performed following the standard protocol during bolus administration of intravenous contrast.  Contrast: OMNIPAQUE IOHEXOL 300 MG/ML  SOLN  Comparison: 12/13/2011.  Findings: Lung bases:  Mild motion degradation at the lung bases. A calcified granuloma at the right lung base.  Mild scarring at the left lung base.  Mild cardiomegaly with aortic valvular calcifications. No pericardial or pleural effusion.  Abdomen/pelvis:  Normal liver, spleen, stomach, gallbladder, biliary tract, right adrenal gland.  Minimal left adrenal thickening is unchanged. A cystic lesion within the pancreatic body measures 1.5 cm on image 39/series 2.  Right renal cyst with bilateral too small to characterize renal lesions.  Small retroperitoneal nodes.  The largest is in the aortocaval space measures 1.2 cm on image 50/series 2.  This is similar to the 12/13/2011 exam, suggesting a reactive etiology.   Moderate stool within the rectum.  Normal terminal ileum and appendix.  Normal small bowel without abdominal ascites.  Fat containing right inguinal hernia. No pelvic adenopathy.  Normal urinary bladder.  Radiation seeds in the prostate. No significant free fluid.  Surgical changes about the anterior abdominal wall with secondary fascial thickening.No acute osseous abnormality.  Bones/Musculoskeletal:  No acute osseous abnormality.  IMPRESSION:  1. No acute process in the abdomen or pelvis. 2.  Stool within the rectum, suggesting constipation or possibly fecal impaction. 3.  Aortic valvular calcifications.  Consider echocardiography non emergently to exclude valvular pathology. 4.  Cystic lesion within the pancreatic  body which measures 1.5 cm. Most likely a pseudocyst versus less likely an indolent neoplasm such as intraductal papillary mucinous tumor.  Recommend follow-up with pre and post contrast abdominal MRI at 1 year. This recommendation follows ACR consensus guidelines:  Managing Incidental Findings on Abdominal CT:  White Paper of the ACR Incidental Findings Committee.  [facility] [phone]   Original Report Authenticated By: Jeronimo Greaves, M.D.        Time spent:70 minutes Code Status:   Full Family Communication:   Family at bedside   Amond Speranza, DO  [facility] Pager [phone]  If 7PM-7AM, please contact night-coverage [email or web address] Password [facility] 04/04/2013, 4:33 PM

## 2013-04-04 NOTE — ED Notes (Signed)
Jared Hall, EMT transporting pt to floor. Belongings being sent with pt to floor

## 2013-04-04 NOTE — ED Notes (Signed)
MD at bedside. 

## 2013-04-04 NOTE — ED Notes (Signed)
Patient c/o right hip x 3 days, patient states he usually can get around with walker but now cannot bear weight on right leg, patient with ongoing cellulitis on feet bilaterally

## 2013-04-04 NOTE — Progress Notes (Signed)
ANTIBIOTIC CONSULT NOTE - INITIAL  Pharmacy Consult for vancomycin Indication: lower extremity cellulitis  No Known Allergies  Patient Measurements: Height: 5\' 11"  (180.3 cm) Weight: 264 lb (119.75 kg) IBW/kg (Calculated) : 75.3 Adjusted Body Weight:   Vital Signs: Temp: 97.8 F (36.6 C) (05/16 1554) Temp src: Oral (05/16 1554) BP: 163/81 mmHg (05/16 1554) Pulse Rate: 88 (05/16 1554) Intake/Output from previous day:   Intake/Output from this shift:    Labs:  Recent Labs  04/04/13 1140  WBC 10.0  HGB 13.2  PLT 193  CREATININE 1.33   Estimated Creatinine Clearance: 58.3 ml/min (by C-G formula based on Cr of 1.33). No results found for this basename: VANCOTROUGH, VANCOPEAK, VANCORANDOM, GENTTROUGH, GENTPEAK, GENTRANDOM, TOBRATROUGH, TOBRAPEAK, TOBRARND, AMIKACINPEAK, AMIKACINTROU, AMIKACIN,  in the last 72 hours   Microbiology: No results found for this or any previous visit (from the past 720 hour(s)).  Medical History: Past Medical History  Diagnosis Date  . Umbilical hernia   . Diabetes mellitus   . Cancer     Medications:  Scheduled:  . sodium chloride   Intravenous STAT  . aspirin EC  81 mg Oral Daily  . buPROPion  150 mg Oral Daily  . clopidogrel  75 mg Oral Daily  . docusate sodium  100 mg Oral BID  . enoxaparin (LOVENOX) injection  40 mg Subcutaneous Q24H  . [START ON 04/05/2013] insulin aspart  0-15 Units Subcutaneous TID WC  . insulin glargine  60 Units Subcutaneous QHS  . pantoprazole  40 mg Oral Daily  . pregabalin  50 mg Oral BID  . primidone  25 mg Oral BID  . senna  2 tablet Oral BID  . simvastatin  20 mg Oral QPM  . terazosin  4 mg Oral Daily  . vitamin B-12  1,000 mcg Oral Daily   Assessment: 79 yr old male presented to the ED with complaints of leg edema and pain plus rt hip pain. He has failed 2 ten day courses of antibiotics as an outpatient. To be admitted for IV antibiotics for cellulitis of his left lower extremity. Pt received  vancomycin 1 Gm in the ED.  Goal of Therapy:  Vancomycin trough level 10-15 mcg/ml  Plan:  Pt had Vancomycin 1 Gm IV in the ED. Pt wt 120 kg and CrCl 58 ml/min. Will start Vancomycin 1500 mg q24 hrs tonight.  Eugene Garnet 04/04/2013,6:09 PM

## 2013-04-04 NOTE — ED Notes (Signed)
Kathlene November and Lawson Fiscal Hine's (pts son and daughter) phone number [phone]

## 2013-04-04 NOTE — ED Notes (Signed)
Pt denies feeling shortness of breath at this time. Pt resting in bed. Pt alert and mentating appropriately. NAD noted at this time

## 2013-04-04 NOTE — ED Provider Notes (Signed)
History     CSN: [id number]  Arrival date & time 04/04/13  4098   First MD Initiated Contact with Patient 04/04/13 1050      Chief Complaint  Patient presents with  . Hip Pain    right    (Consider location/radiation/quality/duration/timing/severity/associated sxs/prior treatment) HPI This 79 year old male lives at home alone, at baseline has generalized weakness and walks with a walker from chronic bilateral foot pain with chronic edema to his lower legs chronic stasis dermatitis, within the last few weeks had localized cellulitis left lower leg and foot street with oral antibiotics, temporarily improved but not the last few days has recurrent worse than usual pain with redness warmth and tenderness to his left foot and lower leg consistent with recurrent localized cellulitis he also however has atraumatic new severe right hip pain gradual onset constant over the last few days without a fall but now the patient cannot bear weight on his right leg due to right hip pain or left leg due to left foot pain today, upon arrival to the emergency room he realizes he also has abdominal pain to the right lower quadrant which may or may not have been in the last few days as well but he was perceiving his right hip pain in part, he is no fever confusion chest pain shortness breath or cough vomiting diarrhea dysuria or testicular pain, he takes hydrocodone for his chronic pain which is not helping his new hip pain is new left foot pain. he states his blood sugars have been running approximately 150.   Past Medical History  Diagnosis Date  . Umbilical hernia   . Diabetes mellitus   . Cancer     Past Surgical History  Procedure Laterality Date  . Ventral hernia repair  12/18/2011    Procedure: HERNIA REPAIR VENTRAL ADULT;  Surgeon: Ernestene Mention, MD;  Location: [facility] OR;  Service: General;  Laterality: N/A;    No family history on file.  History  Substance Use Topics  . Smoking status: Never  Smoker   . Smokeless tobacco: Never Used  . Alcohol Use: No      Review of Systems 10 Systems reviewed and are negative for acute change except as noted in the HPI. Allergies  Review of patient's allergies indicates no known allergies.  Home Medications   Current Outpatient Rx  Name  Route  Sig  Dispense  Refill  . aspirin EC 81 MG tablet   Oral   Take 81 mg by mouth daily.         . B Complex Vitamins (VITAMIN B COMPLEX PO)   Oral   Take 1 tablet by mouth daily.         [name] Kitchen buPROPion (WELLBUTRIN SR) 150 MG 12 hr tablet   Oral   Take 150 mg by mouth daily.         . clopidogrel (PLAVIX) 75 MG tablet   Oral   Take 75 mg by mouth daily.         . furosemide (LASIX) 80 MG tablet   Oral   Take 40 mg by mouth daily.         . insulin glargine (LANTUS) 100 UNIT/ML injection   Subcutaneous   Inject 60 Units into the skin 2 (two) times daily.         . pantoprazole (PROTONIX) 40 MG tablet   Oral   Take 40 mg by mouth daily.         [name] Kitchen  potassium chloride SA (K-DUR,KLOR-CON) 20 MEQ tablet   Oral   Take 20 mEq by mouth daily.         . pregabalin (LYRICA) 50 MG capsule   Oral   Take 50 mg by mouth 2 (two) times daily.         . primidone (MYSOLINE) 50 MG tablet   Oral   Take 25 mg by mouth 2 (two) times daily.         . simvastatin (ZOCOR) 20 MG tablet   Oral   Take 20 mg by mouth every evening.         . terazosin (HYTRIN) 2 MG capsule   Oral   Take 4 mg by mouth daily.         . vitamin B-12 (CYANOCOBALAMIN) 1000 MCG tablet   Oral   Take 1,000 mcg by mouth daily.         [name] Kitchen zolpidem (AMBIEN) 10 MG tablet   Oral   Take 10 mg by mouth at bedtime as needed. For insomnia         . docusate sodium 100 MG CAPS   Oral   Take 100 mg by mouth 2 (two) times daily.   30 capsule   0   . doxycycline (VIBRAMYCIN) 50 MG capsule   Oral   Take 1 capsule (50 mg total) by mouth 2 (two) times daily.   14 capsule   0   .  HYDROcodone-acetaminophen (NORCO) 10-325 MG per tablet   Oral   Take 1 tablet by mouth every 8 (eight) hours as needed.   30 tablet   0   . senna (SENOKOT) 8.6 MG TABS   Oral   Take 2 tablets (17.2 mg total) by mouth 2 (two) times daily.   120 each   1     BP 166/77  Pulse 90  Temp(Src) 97.2 F (36.2 C) (Oral)  Resp 18  Ht 5\' 11"  (1.803 m)  Wt 264 lb (119.75 kg)  BMI 36.84 kg/m2  SpO2 91%  Physical Exam  Nursing note and vitals reviewed. Constitutional:  Awake, alert, nontoxic appearance. Pleasant cooperative morbidly obese.  HENT:  Head: Atraumatic.  Eyes: Right eye exhibits no discharge. Left eye exhibits no discharge.  Neck: Neck supple.  Cardiovascular: Normal rate and regular rhythm.   No murmur heard. Pulmonary/Chest: Effort normal and breath sounds normal. No respiratory distress. He has no wheezes. He has no rales. He exhibits no tenderness.  Abdominal: Soft. Bowel sounds are normal. He exhibits no distension and no mass. There is tenderness. There is no rebound and no guarding.  Minimal diffuse tenderness moderate right lower quadrant tenderness without rebound  Genitourinary:  Testicles are nontender no palpable inguinal hernias  Musculoskeletal: He exhibits edema and tenderness.  Baseline ROM, no obvious new focal weakness. Right hip is tender with pain with active and passive range of motion without erythema or increased warmth, bilateral lower legs have chronic stasis dermatitis right lower leg with no surrounding cellulitis his right foot his cap refill less than than 3 seconds with baseline light touch and movement his left leg is chronic stasis dermatitis with localized acute cellulitis with surrounding erythema induration warmth tenderness for the last few days with capillary refill less than 3 seconds normal light touch as well with good movement and strength to the left foot.  Neurological: He is alert.  Mental status and motor strength appears baseline  for patient and situation.  Skin: No rash noted.  Psychiatric: He has a normal mood and affect.    ED Course  Procedures (including critical care time) Patient understand and agree with initial ED impression and plan with expectations set for ED visit.D/w [facility] for admit. Pt stable in ED with no significant deterioration in condition.   Labs Reviewed  CBC WITH DIFFERENTIAL - Abnormal; Notable for the following:    RBC 6.08 (*)    MCV 67.1 (*)    MCH 21.7 (*)    RDW 15.6 (*)    All other components within normal limits  COMPREHENSIVE METABOLIC PANEL - Abnormal; Notable for the following:    Glucose, Bld 113 (*)    GFR calc non Af Amer 49 (*)    GFR calc Af Amer 57 (*)    All other components within normal limits  BASIC METABOLIC PANEL - Abnormal; Notable for the following:    Glucose, Bld 100 (*)    GFR calc non Af Amer 67 (*)    GFR calc Af Amer 78 (*)    All other components within normal limits  CBC - Abnormal; Notable for the following:    Hemoglobin 11.4 (*)    HCT 36.0 (*)    MCV 67.4 (*)    MCH 21.3 (*)    RDW 15.8 (*)    All other components within normal limits  HEMOGLOBIN A1C - Abnormal; Notable for the following:    Hemoglobin A1C 8.3 (*)    Mean Plasma Glucose 192 (*)    All other components within normal limits  GLUCOSE, CAPILLARY - Abnormal; Notable for the following:    Glucose-Capillary 111 (*)    All other components within normal limits  GLUCOSE, CAPILLARY - Abnormal; Notable for the following:    Glucose-Capillary 156 (*)    All other components within normal limits  GLUCOSE, CAPILLARY - Abnormal; Notable for the following:    Glucose-Capillary 110 (*)    All other components within normal limits  GLUCOSE, CAPILLARY - Abnormal; Notable for the following:    Glucose-Capillary 159 (*)    All other components within normal limits  GLUCOSE, CAPILLARY - Abnormal; Notable for the following:    Glucose-Capillary 194 (*)    All other components within  normal limits  BASIC METABOLIC PANEL - Abnormal; Notable for the following:    Glucose, Bld 117 (*)    GFR calc non Af Amer 58 (*)    GFR calc Af Amer 67 (*)    All other components within normal limits  GLUCOSE, CAPILLARY - Abnormal; Notable for the following:    Glucose-Capillary 125 (*)    All other components within normal limits  GLUCOSE, CAPILLARY - Abnormal; Notable for the following:    Glucose-Capillary 118 (*)    All other components within normal limits  GLUCOSE, CAPILLARY - Abnormal; Notable for the following:    Glucose-Capillary 180 (*)    All other components within normal limits  BASIC METABOLIC PANEL - Abnormal; Notable for the following:    Glucose, Bld 139 (*)    GFR calc non Af Amer 56 (*)    GFR calc Af Amer 65 (*)    All other components within normal limits  GLUCOSE, CAPILLARY - Abnormal; Notable for the following:    Glucose-Capillary 201 (*)    All other components within normal limits  GLUCOSE, CAPILLARY - Abnormal; Notable for the following:    Glucose-Capillary 171 (*)    All other components within normal limits  GLUCOSE, CAPILLARY -  Abnormal; Notable for the following:    Glucose-Capillary 156 (*)    All other components within normal limits  GLUCOSE, CAPILLARY - Abnormal; Notable for the following:    Glucose-Capillary 184 (*)    All other components within normal limits  GLUCOSE, CAPILLARY - Abnormal; Notable for the following:    Glucose-Capillary 149 (*)    All other components within normal limits  GLUCOSE, CAPILLARY - Abnormal; Notable for the following:    Glucose-Capillary 130 (*)    All other components within normal limits  GLUCOSE, CAPILLARY - Abnormal; Notable for the following:    Glucose-Capillary 109 (*)    All other components within normal limits  GLUCOSE, CAPILLARY - Abnormal; Notable for the following:    Glucose-Capillary 190 (*)    All other components within normal limits  CULTURE, BLOOD (ROUTINE X 2)  CULTURE, BLOOD  (ROUTINE X 2)  URINE CULTURE  LIPASE, BLOOD  URINALYSIS, ROUTINE W REFLEX MICROSCOPIC  GLUCOSE, CAPILLARY  CG4 I-STAT (LACTIC ACID)   No results found.   1. Cellulitis of left leg   2. Right hip pain   3. Abdominal pain   4. AKI (acute kidney injury)   5. DM (diabetes mellitus)   6. Essential hypertension, benign   7. History of PSVT (paroxysmal supraventricular tachycardia)       MDM  The patient appears reasonably stabilized for admission considering the current resources, flow, and capabilities available in the ED at this time, and I doubt any other [facility] requiring further screening and/or treatment in the ED prior to admission.        Hurman Horn, MD 04/14/13 [phone]

## 2013-04-04 NOTE — ED Notes (Signed)
Report called to Renata Caprice, Charity fundraiser. Nurse has no further questions upon report given.

## 2013-04-05 ENCOUNTER — Inpatient Hospital Stay (HOSPITAL_COMMUNITY): Payer: Medicare Other

## 2013-04-05 DIAGNOSIS — M79609 Pain in unspecified limb: Secondary | ICD-10-CM

## 2013-04-05 DIAGNOSIS — R609 Edema, unspecified: Secondary | ICD-10-CM

## 2013-04-05 LAB — BASIC METABOLIC PANEL
CO2: 23 mEq/L (ref 19–32)
Chloride: 108 mEq/L (ref 96–112)
Creatinine, Ser: 1.02 mg/dL (ref 0.50–1.35)
GFR calc Af Amer: 78 mL/min — ABNORMAL LOW (ref >90)
Potassium: 3.8 mEq/L (ref 3.5–5.1)

## 2013-04-05 LAB — GLUCOSE, CAPILLARY
Glucose-Capillary: 110 mg/dL — ABNORMAL HIGH (ref 70–99)
Glucose-Capillary: 159 mg/dL — ABNORMAL HIGH (ref 70–99)
Glucose-Capillary: 194 mg/dL — ABNORMAL HIGH (ref 70–99)
Glucose-Capillary: 125 mg/dL — ABNORMAL HIGH (ref 70–99)

## 2013-04-05 LAB — CBC
HCT: 36 % — ABNORMAL LOW (ref 39.0–52.0)
Hemoglobin: 11.4 g/dL — ABNORMAL LOW (ref 13.0–17.0)
MCV: 67.4 fL — ABNORMAL LOW (ref 78.0–100.0)
RDW: 15.8 % — ABNORMAL HIGH (ref 11.5–15.5)
WBC: 7.6 10*3/uL (ref 4.0–10.5)

## 2013-04-05 LAB — HEMOGLOBIN A1C
Hgb A1c MFr Bld: 8.3 % — ABNORMAL HIGH
Mean Plasma Glucose: 192 mg/dL — ABNORMAL HIGH

## 2013-04-05 MED ORDER — POTASSIUM CHLORIDE CRYS ER 20 MEQ PO TBCR
20.0000 meq | EXTENDED_RELEASE_TABLET | Freq: Every day | ORAL | Status: DC
  Administered 2013-04-05 – 2013-04-08 (×4): 20 meq via ORAL
  Filled 2013-04-05 (×5): qty 1

## 2013-04-05 MED ORDER — SALINE SPRAY 0.65 % NA SOLN
1.0000 | NASAL | Status: DC | PRN
  Administered 2013-04-05: 1 via NASAL
  Filled 2013-04-05: qty 44

## 2013-04-05 MED ORDER — FUROSEMIDE 40 MG PO TABS
40.0000 mg | ORAL_TABLET | Freq: Every day | ORAL | Status: DC
  Administered 2013-04-05 – 2013-04-08 (×4): 40 mg via ORAL
  Filled 2013-04-05 (×4): qty 1

## 2013-04-05 NOTE — Progress Notes (Signed)
VASCULAR LAB PRELIMINARY  PRELIMINARY  PRELIMINARY  PRELIMINARY  Bilateral lower extremity venous Dopplers completed.    Preliminary report:  There is no DVT or SVT noted in the bilateral lower extremities.  Jared Hall, RVT 04/05/2013, 12:07 PM

## 2013-04-05 NOTE — Progress Notes (Signed)
Pt had shaking/tremors in bilateral arms. When asked about this the pt stated that this has happened before a long time ago. Pt is a poor historian so it was difficult to get good information from him. On call MD was notified & advised me to take a temperature. Temperature was 97.7. Will continue to monitor.

## 2013-04-05 NOTE — Progress Notes (Signed)
Subjective: Admission history and physical reviewed. Apparently patient admitted with cellulitis, worsening leg erythema, edema, right hip pain. Apparently patient has been treated with 2 rounds of antibiotics outpatient without improvement. Patient denies any falls. He states he typically ambulates with a walker. He states he typically does not have any hip pain. X-ray is negative for pathology. Currently he denies fever or chills.  Objective: Vital signs in last 24 hours: Temp:  [97.8 F (36.6 C)-98.7 F (37.1 C)] 97.8 F (36.6 C) (05/17 0602) Pulse Rate:  [76-88] 76 (05/17 0602) Resp:  [19-22] 20 (05/17 0602) BP: (150-176)/(70-86) 151/77 mmHg (05/17 0602) SpO2:  [94 %-100 %] 95 % (05/17 0602) Weight:  [119.75 kg (264 lb)] 119.75 kg (264 lb) (05/16 0956) Weight change:  Last BM Date: 04/04/13  Intake/Output from previous day:   Intake/Output this shift:    General appearance: morbidly obese Resp: A few basilar crackles Cardio: regular rate and rhythm, S1, S2 normal, no murmur, click, rub or gallop Extremities: Bilateral leg edema 2+, patient has some venous stasis dermatitis. A demarcation line is noted. There is no expansion of erythema past the line. Patient does have a denuded blister left posterior calf Neurologic: Grossly normal  Lab Results:  Results for orders placed during the hospital encounter of 04/04/13 (from the past 24 hour(s))  GLUCOSE, CAPILLARY     Status: None   Collection Time    04/04/13 11:18 AM      Result Value Range   Glucose-Capillary 99  70 - 99 mg/dL   Comment 1 Documented in Chart     Comment 2 Notify RN    URINALYSIS, ROUTINE W REFLEX MICROSCOPIC     Status: None   Collection Time    04/04/13 11:20 AM      Result Value Range   Color, Urine YELLOW  YELLOW   APPearance CLEAR  CLEAR   Specific Gravity, Urine 1.007  1.005 - 1.030   pH 7.5  5.0 - 8.0   Glucose, UA NEGATIVE  NEGATIVE mg/dL   Hgb urine dipstick NEGATIVE  NEGATIVE   Bilirubin  Urine NEGATIVE  NEGATIVE   Ketones, ur NEGATIVE  NEGATIVE mg/dL   Protein, ur NEGATIVE  NEGATIVE mg/dL   Urobilinogen, UA 0.2  0.0 - 1.0 mg/dL   Nitrite NEGATIVE  NEGATIVE   Leukocytes, UA NEGATIVE  NEGATIVE  CBC WITH DIFFERENTIAL     Status: Abnormal   Collection Time    04/04/13 11:40 AM      Result Value Range   WBC 10.0  4.0 - 10.5 K/uL   RBC 6.08 (*) 4.22 - 5.81 MIL/uL   Hemoglobin 13.2  13.0 - 17.0 g/dL   HCT 16.1  09.6 - 04.5 %   MCV 67.1 (*) 78.0 - 100.0 fL   MCH 21.7 (*) 26.0 - 34.0 pg   MCHC 32.4  30.0 - 36.0 g/dL   RDW 40.9 (*) 81.1 - 91.4 %   Platelets 193  150 - 400 K/uL   Neutrophils Relative % 75  43 - 77 %   Lymphocytes Relative 14  12 - 46 %   Monocytes Relative 8  3 - 12 %   Eosinophils Relative 3  0 - 5 %   Basophils Relative 0  0 - 1 %   Neutro Abs 7.5  1.7 - 7.7 K/uL   Lymphs Abs 1.4  0.7 - 4.0 K/uL   Monocytes Absolute 0.8  0.1 - 1.0 K/uL   Eosinophils Absolute 0.3  0.0 -  0.7 K/uL   Basophils Absolute 0.0  0.0 - 0.1 K/uL   RBC Morphology ELLIPTOCYTES    COMPREHENSIVE METABOLIC PANEL     Status: Abnormal   Collection Time    04/04/13 11:40 AM      Result Value Range   Sodium 142  135 - 145 mEq/L   Potassium 4.3  3.5 - 5.1 mEq/L   Chloride 106  96 - 112 mEq/L   CO2 29  19 - 32 mEq/L   Glucose, Bld 113 (*) 70 - 99 mg/dL   BUN 18  6 - 23 mg/dL   Creatinine, Ser 1.61  0.50 - 1.35 mg/dL   Calcium 9.8  8.4 - 09.6 mg/dL   Total Protein 7.1  6.0 - 8.3 g/dL   Albumin 3.7  3.5 - 5.2 g/dL   AST 26  0 - 37 U/L   ALT 21  0 - 53 U/L   Alkaline Phosphatase 73  39 - 117 U/L   Total Bilirubin 0.4  0.3 - 1.2 mg/dL   GFR calc non Af Amer 49 (*) >90 mL/min   GFR calc Af Amer 57 (*) >90 mL/min  LIPASE, BLOOD     Status: None   Collection Time    04/04/13 11:40 AM      Result Value Range   Lipase 28  11 - 59 U/L  CG4 I-STAT (LACTIC ACID)     Status: None   Collection Time    04/04/13 11:44 AM      Result Value Range   Lactic Acid, Venous 0.77  0.5 - 2.2  mmol/L  GLUCOSE, CAPILLARY     Status: Abnormal   Collection Time    04/04/13  5:40 PM      Result Value Range   Glucose-Capillary 111 (*) 70 - 99 mg/dL  GLUCOSE, CAPILLARY     Status: Abnormal   Collection Time    04/04/13  9:57 PM      Result Value Range   Glucose-Capillary 156 (*) 70 - 99 mg/dL  BASIC METABOLIC PANEL     Status: Abnormal   Collection Time    04/05/13  6:45 AM      Result Value Range   Sodium 141  135 - 145 mEq/L   Potassium 3.8  3.5 - 5.1 mEq/L   Chloride 108  96 - 112 mEq/L   CO2 23  19 - 32 mEq/L   Glucose, Bld 100 (*) 70 - 99 mg/dL   BUN 12  6 - 23 mg/dL   Creatinine, Ser 0.45  0.50 - 1.35 mg/dL   Calcium 8.5  8.4 - 40.9 mg/dL   GFR calc non Af Amer 67 (*) >90 mL/min   GFR calc Af Amer 78 (*) >90 mL/min  CBC     Status: Abnormal   Collection Time    04/05/13  6:45 AM      Result Value Range   WBC 7.6  4.0 - 10.5 K/uL   RBC 5.34  4.22 - 5.81 MIL/uL   Hemoglobin 11.4 (*) 13.0 - 17.0 g/dL   HCT 81.1 (*) 91.4 - 78.2 %   MCV 67.4 (*) 78.0 - 100.0 fL   MCH 21.3 (*) 26.0 - 34.0 pg   MCHC 31.7  30.0 - 36.0 g/dL   RDW 95.6 (*) 21.3 - 08.6 %   Platelets 182  150 - 400 K/uL  GLUCOSE, CAPILLARY     Status: Abnormal   Collection Time    04/05/13  8:00 AM      Result Value Range   Glucose-Capillary 110 (*) 70 - 99 mg/dL      Studies/Results: Dg Hip Complete Right  04/04/2013   *RADIOLOGY REPORT*  Clinical Data: Right hip pain without trauma  RIGHT HIP - COMPLETE 2+ VIEW  Comparison: None  Findings: Negative for fracture.  Mild cartilage loss in the medial acetabulum.  Negative for AVN.  No focal bony lesion.  IMPRESSION: No acute abnormality.   Original Report Authenticated By: Janeece Riggers, M.D.   Ct Abdomen Pelvis W Contrast  04/04/2013   *RADIOLOGY REPORT*  Clinical Data: Right lower quadrant pain.  Sweating.  Chills. History of umbilical hernia repair and prostate cancer.  Diabetes.  CT ABDOMEN AND PELVIS WITH CONTRAST  Technique:  Multidetector CT  imaging of the abdomen and pelvis was performed following the standard protocol during bolus administration of intravenous contrast.  Contrast: OMNIPAQUE IOHEXOL 300 MG/ML  SOLN  Comparison: 12/13/2011.  Findings: Lung bases:  Mild motion degradation at the lung bases. A calcified granuloma at the right lung base.  Mild scarring at the left lung base.  Mild cardiomegaly with aortic valvular calcifications. No pericardial or pleural effusion.  Abdomen/pelvis:  Normal liver, spleen, stomach, gallbladder, biliary tract, right adrenal gland.  Minimal left adrenal thickening is unchanged. A cystic lesion within the pancreatic body measures 1.5 cm on image 39/series 2.  Right renal cyst with bilateral too small to characterize renal lesions.  Small retroperitoneal nodes.  The largest is in the aortocaval space measures 1.2 cm on image 50/series 2.  This is similar to the 12/13/2011 exam, suggesting a reactive etiology.  Moderate stool within the rectum.  Normal terminal ileum and appendix.  Normal small bowel without abdominal ascites.  Fat containing right inguinal hernia. No pelvic adenopathy.  Normal urinary bladder.  Radiation seeds in the prostate. No significant free fluid.  Surgical changes about the anterior abdominal wall with secondary fascial thickening.No acute osseous abnormality.  Bones/Musculoskeletal:  No acute osseous abnormality.  IMPRESSION:  1. No acute process in the abdomen or pelvis. 2.  Stool within the rectum, suggesting constipation or possibly fecal impaction. 3.  Aortic valvular calcifications.  Consider echocardiography non emergently to exclude valvular pathology. 4.  Cystic lesion within the pancreatic body which measures 1.5 cm. Most likely a pseudocyst versus less likely an indolent neoplasm such as intraductal papillary mucinous tumor.  Recommend follow-up with pre and post contrast abdominal MRI at 1 year. This recommendation follows ACR consensus guidelines:  Managing Incidental  Findings on Abdominal CT:  White Paper of the ACR Incidental Findings Committee.  [facility] [phone]   Original Report Authenticated By: Jeronimo Greaves, M.D.    Medications:  Prior to Admission:  Prescriptions prior to admission  Medication Sig Dispense Refill  . aspirin EC 81 MG tablet Take 81 mg by mouth daily.      . B Complex Vitamins (VITAMIN B COMPLEX PO) Take 1 tablet by mouth daily.      [name] Kitchen buPROPion (WELLBUTRIN SR) 150 MG 12 hr tablet Take 150 mg by mouth daily.      . clopidogrel (PLAVIX) 75 MG tablet Take 75 mg by mouth daily.      . clotrimazole-betamethasone (LOTRISONE) cream Apply 1 application topically 2 (two) times daily as needed (to affected areas of scalp).      . furosemide (LASIX) 80 MG tablet Take 40 mg by mouth daily.      [name] Kitchen HYDROcodone-acetaminophen (VICODIN)  5-500 MG per tablet Take 2 tablets by mouth 3 (three) times daily.       . insulin glargine (LANTUS) 100 UNIT/ML injection Inject 60 Units into the skin 2 (two) times daily.      . pantoprazole (PROTONIX) 40 MG tablet Take 40 mg by mouth daily.      . potassium chloride SA (K-DUR,KLOR-CON) 20 MEQ tablet Take 20 mEq by mouth daily.      . pregabalin (LYRICA) 50 MG capsule Take 50 mg by mouth 2 (two) times daily.      . primidone (MYSOLINE) 50 MG tablet Take 25 mg by mouth 2 (two) times daily.      . simvastatin (ZOCOR) 20 MG tablet Take 20 mg by mouth every evening.      . terazosin (HYTRIN) 2 MG capsule Take 4 mg by mouth daily.      . vitamin B-12 (CYANOCOBALAMIN) 1000 MCG tablet Take 1,000 mcg by mouth daily.      [name] Kitchen zolpidem (AMBIEN) 10 MG tablet Take 10 mg by mouth at bedtime as needed. For insomnia       Scheduled: . aspirin EC  81 mg Oral Daily  . buPROPion  150 mg Oral Daily  . clopidogrel  75 mg Oral Daily  . docusate sodium  100 mg Oral BID  . enoxaparin (LOVENOX) injection  40 mg Subcutaneous Q24H  . insulin aspart  0-15 Units Subcutaneous TID WC  . insulin glargine  60 Units Subcutaneous  QHS  . pantoprazole  40 mg Oral Daily  . pregabalin  50 mg Oral BID  . primidone  25 mg Oral BID  . senna  2 tablet Oral BID  . simvastatin  20 mg Oral QPM  . terazosin  4 mg Oral Daily  . vancomycin  1,500 mg Intravenous Q24H  . vitamin B-12  1,000 mcg Oral Daily   Continuous:   Assessment/Plan: Failed outpatient treatment for cellulitis in a patient with chronic lower extremity edema, venous stasis dermatitis. He does have a denuded blister posterior left calf. Continue antibiotics, wound care consult. Right hip pain, patient denies any history of fall, x-ray negative for pathology, patient elevated leg without problem, continue analgesia, PT evaluation Bilateral lower extremity edema 2-3+. DC IV fluids resume home dose of Lasix Diabetes resume home medication Hypercholesterolemia Hypertension CT abnormality of the pancreas, cystic lesion . Most likely a pseudocyst versus less likely an indolent neoplasm such as intraductal papillary mucinous tumor.  Recommend follow-up with pre and post contrast abdominal MRI at 1 year.  LOS: 1 day   Clark Clowdus D 04/05/2013, 8:29 AM

## 2013-04-06 LAB — URINE CULTURE: Colony Count: 40000

## 2013-04-06 LAB — BASIC METABOLIC PANEL
BUN: 10 mg/dL (ref 6–23)
Calcium: 9.1 mg/dL (ref 8.4–10.5)
Chloride: 106 mEq/L (ref 96–112)
Creatinine, Ser: 1.16 mg/dL (ref 0.50–1.35)
GFR calc Af Amer: 67 mL/min — ABNORMAL LOW (ref >90)
GFR calc non Af Amer: 58 mL/min — ABNORMAL LOW (ref >90)

## 2013-04-06 NOTE — Evaluation (Signed)
Physical Therapy Evaluation Patient Details Name: Jared Hall MRN: [id number] DOB: November 04, 1931 Today's Date: 04/06/2013 Time: 1139-1200 PT Time Calculation (min): 21 min  PT Assessment / Plan / Recommendation Clinical Impression  79 yo male admitted with recurrent cellulitis and RLE pain.  He is deconditioned and lives alone with intermittent caregivers.  He will benefit from short term SNF to improve functional mobility to level of independence to return to prior living situation.    PT Assessment  Patient needs continued PT services    Follow Up Recommendations  SNF    Does the patient have the potential to tolerate intense rehabilitation      Barriers to Discharge Decreased caregiver support      Equipment Recommendations  None recommended by PT    Recommendations for Other Services OT consult   Frequency Min 3X/week    Precautions / Restrictions Precautions Precautions: Fall Restrictions Weight Bearing Restrictions: No   Pertinent Vitals/Pain Pt c/o pain in right leg with attempts at ROM      Mobility  Bed Mobility Bed Mobility: Supine to Sit;Sit to Supine Supine to Sit: 4: Min assist Details for Bed Mobility Assistance: Pt is able to lift legs and use momentum to assist with lifting body up to edge of bed Transfers Transfers: Sit to Stand;Stand to Sit Sit to Stand: 4: Min assist Stand to Sit: 4: Min assist Ambulation/Gait Ambulation/Gait Assistance: 4: Min assist Ambulation Distance (Feet): 10 Feet (pt marched in place for 10 steps on each leg) Assistive device: Rolling walker Ambulation/Gait Assistance Details: assist for safety General Gait Details: pt marched in place. He was able to lift knees up, but had some pain with weight bearing throught RLE. Pt limited by weakness Stairs: No Wheelchair Mobility Wheelchair Mobility: No    Exercises General Exercises - Lower Extremity Gluteal Sets: AROM;Both;5 reps;Seated;Standing Short Arc Quad: AROM;Both;10  reps;Supine   PT Diagnosis: Difficulty walking;Abnormality of gait;Generalized weakness;Acute pain  PT Problem List: Decreased strength;Decreased activity tolerance;Decreased mobility;Decreased range of motion;Pain PT Treatment Interventions: DME instruction;Gait training;Functional mobility training;Therapeutic activities;Therapeutic exercise   PT Goals Acute Rehab PT Goals PT Goal Formulation: With patient Time For Goal Achievement: 04/20/13 Potential to Achieve Goals: Good Pt will go Supine/Side to Sit: Independently PT Goal: Supine/Side to Sit - Progress: Goal set today Pt will go Sit to Supine/Side: Independently PT Goal: Sit to Supine/Side - Progress: Goal set today Pt will go Sit to Stand: with modified independence PT Goal: Sit to Stand - Progress: Goal set today Pt will go Stand to Sit: with modified independence PT Goal: Stand to Sit - Progress: Goal set today Pt will Ambulate: >150 feet;with modified independence PT Goal: Ambulate - Progress: Goal set today  Visit Information  Last PT Received On: 04/06/13    Subjective Data  Subjective: "I didn't realize how weak I was" Patient Stated Goal: To go for rehab for a couple of weeks and then return home   Prior Functioning  Home Living Lives With: Alone Available Help at Discharge: Personal care attendant (3 times a week.  Family nearby.  Life alert) Type of Home: House Home Layout: One level Home Adaptive Equipment: Walker - rolling;Walker - four wheeled Prior Function Level of Independence: Independent with assistive device(s) Comments: Pt reports he can't get rid of the cellulitis Communication Communication: No difficulties    Cognition  Cognition Arousal/Alertness: Awake/alert Behavior During Therapy: WFL for tasks assessed/performed Overall Cognitive Status: Within Functional Limits for tasks assessed    Extremity/Trunk Assessment Right  Lower Extremity Assessment RLE ROM/Strength/Tone: Deficits RLE  ROM/Strength/Tone Deficits: edema and area of erythema with trophic changes in skin and nails Left Lower Extremity Assessment LLE ROM/Strength/Tone: Deficits LLE ROM/Strength/Tone Deficits: edema and area of erythema with trophic changes in skin and nails Trunk Assessment Trunk Assessment: Other exceptions Trunk Exceptions: obese   Balance Static Sitting Balance Static Sitting - Balance Support: No upper extremity supported Static Sitting - Level of Assistance: 7: Independent Static Standing Balance Static Standing - Balance Support: Bilateral upper extremity supported Static Standing - Level of Assistance: 5: Stand by assistance Static Standing - Comment/# of Minutes: pt limited by feeling of weakness  End of Session PT - End of Session Activity Tolerance: Patient limited by fatigue Patient left: in chair;with call bell/phone within reach Nurse Communication: Mobility status  GP    Rosey Bath K. Hoffman, [place] [phone] 04/06/2013, 12:12 PM

## 2013-04-06 NOTE — Progress Notes (Signed)
Subjective: No problems overnight. Patient still with some complaint of hip pain although x-ray negative, MRI negative. He elevates legs off the bed without any trouble. Again he denies any falls. His legs are less erythematous. There is no fever no chills. Labs reveal uncontrolled diabetes  Objective: Vital signs in last 24 hours: Temp:  [97.6 F (36.4 C)-98.8 F (37.1 C)] 98.2 F (36.8 C) (05/18 0605) Pulse Rate:  [85-89] 88 (05/18 0605) Resp:  [20] 20 (05/18 0605) BP: (130-169)/(83-93) 130/93 mmHg (05/18 0605) SpO2:  [91 %-94 %] 91 % (05/18 0605) Weight change:  Last BM Date: 04/06/13  Intake/Output from previous day: 05/17 0701 - 05/18 0700 In: 740 [P.O.:240; IV Piggyback:500] Out: 3600 [Urine:3600] Intake/Output this shift:    General appearance: alert and cooperative Resp: clear to auscultation bilaterally Cardio: Regular rate, rhythm Extremities: Less erythema, no warmth. Fairly dry denuded blister left posterior calf.  Lab Results:  Results for orders placed during the hospital encounter of 04/04/13 (from the past 24 hour(s))  GLUCOSE, CAPILLARY     Status: Abnormal   Collection Time    04/05/13 12:46 PM      Result Value Range   Glucose-Capillary 159 (*) 70 - 99 mg/dL  GLUCOSE, CAPILLARY     Status: Abnormal   Collection Time    04/05/13  4:35 PM      Result Value Range   Glucose-Capillary 194 (*) 70 - 99 mg/dL  GLUCOSE, CAPILLARY     Status: Abnormal   Collection Time    04/05/13  9:28 PM      Result Value Range   Glucose-Capillary 125 (*) 70 - 99 mg/dL   Comment 1 Notify RN     Comment 2 Documented in Chart    BASIC METABOLIC PANEL     Status: Abnormal   Collection Time    04/06/13  6:54 AM      Result Value Range   Sodium 142  135 - 145 mEq/L   Potassium 4.0  3.5 - 5.1 mEq/L   Chloride 106  96 - 112 mEq/L   CO2 27  19 - 32 mEq/L   Glucose, Bld 117 (*) 70 - 99 mg/dL   BUN 10  6 - 23 mg/dL   Creatinine, Ser 4.54  0.50 - 1.35 mg/dL   Calcium 9.1   8.4 - 09.8 mg/dL   GFR calc non Af Amer 58 (*) >90 mL/min   GFR calc Af Amer 67 (*) >90 mL/min  GLUCOSE, CAPILLARY     Status: Abnormal   Collection Time    04/06/13  8:07 AM      Result Value Range   Glucose-Capillary 118 (*) 70 - 99 mg/dL      Studies/Results: Dg Hip Complete Right  04/04/2013   *RADIOLOGY REPORT*  Clinical Data: Right hip pain without trauma  RIGHT HIP - COMPLETE 2+ VIEW  Comparison: None  Findings: Negative for fracture.  Mild cartilage loss in the medial acetabulum.  Negative for AVN.  No focal bony lesion.  IMPRESSION: No acute abnormality.   Original Report Authenticated By: Janeece Riggers, M.D.   Ct Abdomen Pelvis W Contrast  04/04/2013   *RADIOLOGY REPORT*  Clinical Data: Right lower quadrant pain.  Sweating.  Chills. History of umbilical hernia repair and prostate cancer.  Diabetes.  CT ABDOMEN AND PELVIS WITH CONTRAST  Technique:  Multidetector CT imaging of the abdomen and pelvis was performed following the standard protocol during bolus administration of intravenous contrast.  Contrast: OMNIPAQUE  IOHEXOL 300 MG/ML  SOLN  Comparison: 12/13/2011.  Findings: Lung bases:  Mild motion degradation at the lung bases. A calcified granuloma at the right lung base.  Mild scarring at the left lung base.  Mild cardiomegaly with aortic valvular calcifications. No pericardial or pleural effusion.  Abdomen/pelvis:  Normal liver, spleen, stomach, gallbladder, biliary tract, right adrenal gland.  Minimal left adrenal thickening is unchanged. A cystic lesion within the pancreatic body measures 1.5 cm on image 39/series 2.  Right renal cyst with bilateral too small to characterize renal lesions.  Small retroperitoneal nodes.  The largest is in the aortocaval space measures 1.2 cm on image 50/series 2.  This is similar to the 12/13/2011 exam, suggesting a reactive etiology.  Moderate stool within the rectum.  Normal terminal ileum and appendix.  Normal small bowel without abdominal  ascites.  Fat containing right inguinal hernia. No pelvic adenopathy.  Normal urinary bladder.  Radiation seeds in the prostate. No significant free fluid.  Surgical changes about the anterior abdominal wall with secondary fascial thickening.No acute osseous abnormality.  Bones/Musculoskeletal:  No acute osseous abnormality.  IMPRESSION:  1. No acute process in the abdomen or pelvis. 2.  Stool within the rectum, suggesting constipation or possibly fecal impaction. 3.  Aortic valvular calcifications.  Consider echocardiography non emergently to exclude valvular pathology. 4.  Cystic lesion within the pancreatic body which measures 1.5 cm. Most likely a pseudocyst versus less likely an indolent neoplasm such as intraductal papillary mucinous tumor.  Recommend follow-up with pre and post contrast abdominal MRI at 1 year. This recommendation follows ACR consensus guidelines:  Managing Incidental Findings on Abdominal CT:  White Paper of the ACR Incidental Findings Committee.  [facility] [phone]   Original Report Authenticated By: Jeronimo Greaves, M.D.    Medications:  Prior to Admission:  Prescriptions prior to admission  Medication Sig Dispense Refill  . aspirin EC 81 MG tablet Take 81 mg by mouth daily.      . B Complex Vitamins (VITAMIN B COMPLEX PO) Take 1 tablet by mouth daily.      [name] Kitchen buPROPion (WELLBUTRIN SR) 150 MG 12 hr tablet Take 150 mg by mouth daily.      . clopidogrel (PLAVIX) 75 MG tablet Take 75 mg by mouth daily.      . clotrimazole-betamethasone (LOTRISONE) cream Apply 1 application topically 2 (two) times daily as needed (to affected areas of scalp).      . furosemide (LASIX) 80 MG tablet Take 40 mg by mouth daily.      [name] Kitchen HYDROcodone-acetaminophen (VICODIN) 5-500 MG per tablet Take 2 tablets by mouth 3 (three) times daily.       . insulin glargine (LANTUS) 100 UNIT/ML injection Inject 60 Units into the skin 2 (two) times daily.      . pantoprazole (PROTONIX) 40 MG tablet Take 40  mg by mouth daily.      . potassium chloride SA (K-DUR,KLOR-CON) 20 MEQ tablet Take 20 mEq by mouth daily.      . pregabalin (LYRICA) 50 MG capsule Take 50 mg by mouth 2 (two) times daily.      . primidone (MYSOLINE) 50 MG tablet Take 25 mg by mouth 2 (two) times daily.      . simvastatin (ZOCOR) 20 MG tablet Take 20 mg by mouth every evening.      . terazosin (HYTRIN) 2 MG capsule Take 4 mg by mouth daily.      . vitamin B-12 (CYANOCOBALAMIN) 1000  MCG tablet Take 1,000 mcg by mouth daily.      [name] Kitchen zolpidem (AMBIEN) 10 MG tablet Take 10 mg by mouth at bedtime as needed. For insomnia       Scheduled: . aspirin EC  81 mg Oral Daily  . buPROPion  150 mg Oral Daily  . clopidogrel  75 mg Oral Daily  . docusate sodium  100 mg Oral BID  . enoxaparin (LOVENOX) injection  40 mg Subcutaneous Q24H  . furosemide  40 mg Oral Daily  . insulin aspart  0-15 Units Subcutaneous TID WC  . insulin glargine  60 Units Subcutaneous QHS  . pantoprazole  40 mg Oral Daily  . potassium chloride SA  20 mEq Oral Daily  . pregabalin  50 mg Oral BID  . primidone  25 mg Oral BID  . senna  2 tablet Oral BID  . simvastatin  20 mg Oral QPM  . terazosin  4 mg Oral Daily  . vancomycin  1,500 mg Intravenous Q24H  . vitamin B-12  1,000 mcg Oral Daily   Continuous:   Assessment/Plan: Failed outpatient treatment for cellulitis in a patient with chronic lower extremity edema, venous stasis dermatitis. He does have a denuded blister posterior left calf which appears dry today. Continue antibiotics, wound care consult.  Right hip pain, patient denies any history of fall, x-ray and mri negative for pathology, patient elevated leg without problem, continue analgesia, PT evaluation  Bilateral lower extremity edema 2-3+. DC IV fluids resume home dose of Lasix  Diabetes resume home medication , a1c shows poor control Hypercholesterolemia  Hypertension  CT abnormality of the pancreas, cystic lesion . Most likely a pseudocyst  versus less likely an indolent neoplasm such as intraductal papillary mucinous tumor. Recommend follow-up with pre and post contrast abdominal MRI at 1 year   LOS: 2 days   Donald Jacque D 04/06/2013, 8:44 AM

## 2013-04-07 DIAGNOSIS — I10 Essential (primary) hypertension: Secondary | ICD-10-CM | POA: Diagnosis present

## 2013-04-07 LAB — BASIC METABOLIC PANEL
BUN: 13 mg/dL (ref 6–23)
CO2: 28 mEq/L (ref 19–32)
Calcium: 9.3 mg/dL (ref 8.4–10.5)
Creatinine, Ser: 1.19 mg/dL (ref 0.50–1.35)
Glucose, Bld: 139 mg/dL — ABNORMAL HIGH (ref 70–99)

## 2013-04-07 LAB — GLUCOSE, CAPILLARY
Glucose-Capillary: 171 mg/dL — ABNORMAL HIGH (ref 70–99)
Glucose-Capillary: 184 mg/dL — ABNORMAL HIGH (ref 70–99)
Glucose-Capillary: 149 mg/dL — ABNORMAL HIGH (ref 70–99)

## 2013-04-07 NOTE — Progress Notes (Signed)
Physical Therapy Treatment Patient Details Name: Jared Hall MRN: [id number] DOB: 1931/03/14 Today's Date: 04/07/2013 Time: 4098-1191 PT Time Calculation (min): 29 min  PT Assessment / Plan / Recommendation Comments on Treatment Session  Pt showing inmprovement in mobility but still needs assist for care and needs 24/7 assist.  He will benefit from continued PT at SNF    Follow Up Recommendations  SNF     Does the patient have the potential to tolerate intense rehabilitation     Barriers to Discharge        Equipment Recommendations  None recommended by PT    Recommendations for Other Services OT consult  Frequency Min 3X/week   Plan Discharge plan remains appropriate    Precautions / Restrictions Precautions Precautions: Fall Restrictions Weight Bearing Restrictions: No   Pertinent Vitals/Pain Pt c/o pain in right leg    Mobility  Bed Mobility Supine to Sit: 4: Min assist Details for Bed Mobility Assistance: Pt up in chair per nursing Transfers Transfers: Sit to Stand;Stand to Sit Sit to Stand: 4: Min guard Stand to Sit: 4: Min guard Details for Transfer Assistance: Pt using arms to push up on armrests Pt asked to go to bathroom for BM and urination.  He was able to stand but was unable to reach enough to provide his own peri care Ambulation/Gait Ambulation/Gait Assistance: 4: Min assist Ambulation Distance (Feet): 150 Feet (75x 2) Assistive device: Rolling walker (wide) Ambulation/Gait Assistance Details: Pt needs encouragement to keep chest up hight and step up into walker Gait Pattern: Decreased step length - right;Decreased step length - left;Trunk flexed Gait velocity: wfl General Gait Details: pt continues to toe extension when feet are on ground and admits to difficulty feeling his feet. Pt with edema in legs exacerbated by dependent position and constriction from top of gripper socks Pt continues with limited endurance Stairs: No Wheelchair  Mobility Wheelchair Mobility: No    Exercises General Exercises - Lower Extremity Ankle Circles/Pumps: AROM;Both;10 reps;Seated Long Arc Quad: AROM;Both;10 reps;Seated Hip Flexion/Marching: AROM;Both;5 reps;Standing Other Exercises Other Exercises: Bilater UE ROM in sitting and standing to activate core muscles   PT Diagnosis:    PT Problem List:   PT Treatment Interventions:     PT Goals Acute Rehab PT Goals PT Goal Formulation: With patient Time For Goal Achievement: 04/20/13 Potential to Achieve Goals: Good Pt will go Supine/Side to Sit: Independently Pt will go Sit to Supine/Side: Independently Pt will go Sit to Stand: with modified independence PT Goal: Sit to Stand - Progress: Progressing toward goal Pt will go Stand to Sit: with modified independence PT Goal: Stand to Sit - Progress: Progressing toward goal Pt will Ambulate: >150 feet;with modified independence PT Goal: Ambulate - Progress: Progressing toward goal  Visit Information  Last PT Received On: 04/07/13 Assistance Needed: +1    Subjective Data  Subjective: My leg hurts (the right leg) Patient Stated Goal: To go for rehab for a couple of weeks and then return home   Cognition  Cognition Arousal/Alertness: Awake/alert Behavior During Therapy: WFL for tasks assessed/performed Overall Cognitive Status: Within Functional Limits for tasks assessed    Balance  Balance Balance Assessed: Yes Static Sitting Balance Static Sitting - Balance Support: No upper extremity supported Static Sitting - Level of Assistance: 7: Independent Static Standing Balance Static Standing - Balance Support: Bilateral upper extremity supported;Right upper extremity supported;Left upper extremity supported;No upper extremity supported Static Standing - Level of Assistance: 5: Stand by assistance Static Standing - Comment/#  of Minutes: Pt appears unsure when working on standing without any UE support.  Pt encouraged to extend hips  and trunk for more core stability  End of Session PT - End of Session Activity Tolerance: Patient limited by fatigue Patient left: in chair;with call bell/phone within reach Nurse Communication: Mobility status   GP    Bayard Hugger. Manson Passey, [place] [phone] 04/07/2013, 10:53 AM

## 2013-04-07 NOTE — Progress Notes (Signed)
Subjective: Patient feeling better today but still quite weak. Legs are feeling better slowly. Patient will need a skilled nursing facility for rehabilitation and hopefully can transition back home. Too weak to walk safely currently - needs assistance to stand - with upper extremity support for stability by assistant   Objective: Weight change:   Intake/Output Summary (Last 24 hours) at 04/07/13 1933 Last data filed at 04/07/13 1600  Gross per 24 hour  Intake   1100 ml  Output   1825 ml  Net   -725 ml   Filed Vitals:   04/06/13 2152 04/07/13 0616 04/07/13 1102 04/07/13 1400  BP:  159/84 157/77 180/90  Pulse:  89 90 91  Temp: 98.3 F (36.8 C) 98.2 F (36.8 C) 98.5 F (36.9 C) 97.5 F (36.4 C)  TempSrc: Oral Oral Oral   Resp: 19 18  20   Height:      Weight:      SpO2: 93% 93% 90% 94%    General Appearance: Alert, cooperative, no distress, appears stated age, appears fatigued Lungs: Clear to auscultation bilaterally, respirations unlabored Heart: Regular rate and rhythm, S1 and S2 normal, no murmur, rub or gallop Abdomen: Soft, non-tender, bowel sounds active all four quadrants, no masses, no organomegaly Extremities: edema improved with still peripheral erythema. Denuded blister on left posterior leg Neuro: Alert, nonfocal  Lab Results: Results for orders placed during the hospital encounter of 04/04/13 (from the past 48 hour(s))  GLUCOSE, CAPILLARY     Status: Abnormal   Collection Time    04/05/13  9:28 PM      Result Value Range   Glucose-Capillary 125 (*) 70 - 99 mg/dL   Comment 1 Notify RN     Comment 2 Documented in Chart    BASIC METABOLIC PANEL     Status: Abnormal   Collection Time    04/06/13  6:54 AM      Result Value Range   Sodium 142  135 - 145 mEq/L   Potassium 4.0  3.5 - 5.1 mEq/L   Chloride 106  96 - 112 mEq/L   CO2 27  19 - 32 mEq/L   Glucose, Bld 117 (*) 70 - 99 mg/dL   BUN 10  6 - 23 mg/dL   Creatinine, Ser 1.61  0.50 - 1.35 mg/dL   Calcium  9.1  8.4 - 09.6 mg/dL   GFR calc non Af Amer 58 (*) >90 mL/min   GFR calc Af Amer 67 (*) >90 mL/min   Comment:            The eGFR has been calculated     using the CKD EPI equation.     This calculation has not been     validated in all clinical     situations.     eGFR's persistently     <90 mL/min signify     possible Chronic Kidney Disease.  GLUCOSE, CAPILLARY     Status: Abnormal   Collection Time    04/06/13  8:07 AM      Result Value Range   Glucose-Capillary 118 (*) 70 - 99 mg/dL  GLUCOSE, CAPILLARY     Status: Abnormal   Collection Time    04/06/13 12:20 PM      Result Value Range   Glucose-Capillary 180 (*) 70 - 99 mg/dL  GLUCOSE, CAPILLARY     Status: Abnormal   Collection Time    04/06/13  5:37 PM      Result Value Range  Glucose-Capillary 201 (*) 70 - 99 mg/dL  GLUCOSE, CAPILLARY     Status: Abnormal   Collection Time    04/06/13  9:54 PM      Result Value Range   Glucose-Capillary 171 (*) 70 - 99 mg/dL  BASIC METABOLIC PANEL     Status: Abnormal   Collection Time    04/07/13  4:15 AM      Result Value Range   Sodium 141  135 - 145 mEq/L   Potassium 3.5  3.5 - 5.1 mEq/L   Chloride 105  96 - 112 mEq/L   CO2 28  19 - 32 mEq/L   Glucose, Bld 139 (*) 70 - 99 mg/dL   BUN 13  6 - 23 mg/dL   Creatinine, Ser 1.61  0.50 - 1.35 mg/dL   Calcium 9.3  8.4 - 09.6 mg/dL   GFR calc non Af Amer 56 (*) >90 mL/min   GFR calc Af Amer 65 (*) >90 mL/min   Comment:            The eGFR has been calculated     using the CKD EPI equation.     This calculation has not been     validated in all clinical     situations.     eGFR's persistently     <90 mL/min signify     possible Chronic Kidney Disease.  GLUCOSE, CAPILLARY     Status: Abnormal   Collection Time    04/07/13  7:51 AM      Result Value Range   Glucose-Capillary 156 (*) 70 - 99 mg/dL   Comment 1 Documented in Chart     Comment 2 Notify RN    GLUCOSE, CAPILLARY     Status: Abnormal   Collection Time     04/07/13 12:14 PM      Result Value Range   Glucose-Capillary 184 (*) 70 - 99 mg/dL   Comment 1 Documented in Chart     Comment 2 Notify RN    GLUCOSE, CAPILLARY     Status: Abnormal   Collection Time    04/07/13  4:58 PM      Result Value Range   Glucose-Capillary 149 (*) 70 - 99 mg/dL   Comment 1 Documented in Chart     Comment 2 Notify RN      Studies/Results: No results found. Medications: Scheduled Meds: . aspirin EC  81 mg Oral Daily  . buPROPion  150 mg Oral Daily  . clopidogrel  75 mg Oral Daily  . docusate sodium  100 mg Oral BID  . enoxaparin (LOVENOX) injection  40 mg Subcutaneous Q24H  . furosemide  40 mg Oral Daily  . insulin aspart  0-15 Units Subcutaneous TID WC  . insulin glargine  60 Units Subcutaneous QHS  . pantoprazole  40 mg Oral Daily  . potassium chloride SA  20 mEq Oral Daily  . pregabalin  50 mg Oral BID  . primidone  25 mg Oral BID  . senna  2 tablet Oral BID  . simvastatin  20 mg Oral QPM  . terazosin  4 mg Oral Daily  . vancomycin  1,500 mg Intravenous Q24H  . vitamin B-12  1,000 mcg Oral Daily   Continuous Infusions:  PRN Meds:.acetaminophen, acetaminophen, HYDROcodone-acetaminophen, morphine injection, ondansetron (ZOFRAN) IV, ondansetron, sodium chloride, zolpidem  Assessment/Plan: Assessment/Plan:  Failed outpatient treatment for cellulitis in a patient with chronic lower extremity edema, venous stasis dermatitis. He does have a denuded blister  posterior left calf which appears dry today. Continue antibiotics, wound care consult.  Right hip pain, patient denies any history of fall, x-ray and mri negative for pathology, patient elevated leg without problem, continue analgesia, PT recommending sniff bed for further physical therapy Bilateral lower extremity edema 2-3+ - improved today.  Diabetes resume home medication , a1c shows poor control  Hypercholesterolemia  Hypertension  CT abnormality of the pancreas, cystic lesion . Most likely a  pseudocyst versus less likely an indolent neoplasm such as intraductal papillary mucinous tumor. Recommend follow-up with pre and post contrast abdominal MRI at 1 year  Disposition - for rehabilitation skilled nursing facility and to complete therapy for lower shin cellulitis      LOS: 3 days   Pearla Dubonnet, MD 04/07/2013, 7:33 PM

## 2013-04-07 NOTE — Progress Notes (Signed)
ANTIBIOTIC CONSULT NOTE - FOLLOW UP  Pharmacy Consult for Vancomycin Indication: lower extremity cellulitis  No Known Allergies  Patient Measurements: Height: 5\' 11"  (180.3 cm) Weight: 264 lb (119.75 kg) IBW/kg (Calculated) : 75.3  Vital Signs: Temp: 97.5 F (36.4 C) (05/19 1400) Temp src: Oral (05/19 1102) BP: 180/90 mmHg (05/19 1400) Pulse Rate: 91 (05/19 1400) Intake/Output from previous day: 05/18 0701 - 05/19 0700 In: 740 [P.O.:240; IV Piggyback:500] Out: 1575 [Urine:1575] Intake/Output from this shift: Total I/O In: 360 [P.O.:360] Out: 750 [Urine:750]  Labs:  Recent Labs  04/05/13 0645 04/06/13 0654 04/07/13 0415  WBC 7.6  --   --   HGB 11.4*  --   --   PLT 182  --   --   CREATININE 1.02 1.16 1.19   Estimated Creatinine Clearance: 65.2 ml/min (by C-G formula based on Cr of 1.19).  Microbiology: Recent Results (from the past 720 hour(s))  URINE CULTURE     Status: None   Collection Time    04/04/13 11:20 AM      Result Value Range Status   Specimen Description URINE, CLEAN CATCH   Final   Special Requests NONE   Final   Culture  Setup Time 04/04/2013 12:18   Final   Colony Count 40,000 COLONIES/ML   Final   Culture PROVIDENCIA RETTGERI   Final   Report Status 04/06/2013 FINAL   Final   Organism ID, Bacteria PROVIDENCIA RETTGERI   Final  CULTURE, BLOOD (ROUTINE X 2)     Status: None   Collection Time    04/04/13 11:40 AM      Result Value Range Status   Specimen Description BLOOD LEFT ARM   Final   Special Requests BOTTLES DRAWN AEROBIC ONLY 10CC   Final   Culture  Setup Time 04/04/2013 18:31   Final   Culture     Final   Value:        BLOOD CULTURE RECEIVED NO GROWTH TO DATE CULTURE WILL BE HELD FOR 5 DAYS BEFORE ISSUING A FINAL NEGATIVE REPORT   Report Status PENDING   Incomplete  CULTURE, BLOOD (ROUTINE X 2)     Status: None   Collection Time    04/04/13 11:53 AM      Result Value Range Status   Specimen Description BLOOD RIGHT ANTECUBITAL    Final   Special Requests BOTTLES DRAWN AEROBIC ONLY 11CC   Final   Culture  Setup Time 04/04/2013 18:33   Final   Culture     Final   Value:        BLOOD CULTURE RECEIVED NO GROWTH TO DATE CULTURE WILL BE HELD FOR 5 DAYS BEFORE ISSUING A FINAL NEGATIVE REPORT   Report Status PENDING   Incomplete    Anti-infectives   Start     Dose/Rate Route Frequency Ordered Stop   04/04/13 2200  vancomycin (VANCOCIN) 1,500 mg in sodium chloride 0.9 % 500 mL IVPB     1,500 mg 250 mL/hr over 120 Minutes Intravenous Every 24 hours 04/04/13 1820     04/04/13 1115  vancomycin (VANCOCIN) IVPB 1000 mg/200 mL premix     1,000 mg 200 mL/hr over 60 Minutes Intravenous  Once 04/04/13 1107 04/04/13 1444      Assessment:  Day # 4 Vancomycin for lower extremity cellulitis. Afebrile, WBC 7.6 on 5/17.  Once daily Vancomycin still appropriate for renal function.  Providencia species in urine culture, low colony count.   Goal of Therapy:  Vancomycin trough  level 10-15 mcg/ml  Plan:   Continue Vancomycin 1500 mg IV q24hrs.  Will consider checking trough level if >5-7 days therapy needed.  Dennie Fetters, [place] Pager: [phone] 04/07/2013,4:19 PM

## 2013-04-08 DIAGNOSIS — L0390 Cellulitis, unspecified: Secondary | ICD-10-CM | POA: Diagnosis not present

## 2013-04-08 DIAGNOSIS — E785 Hyperlipidemia, unspecified: Secondary | ICD-10-CM | POA: Diagnosis not present

## 2013-04-08 DIAGNOSIS — N289 Disorder of kidney and ureter, unspecified: Secondary | ICD-10-CM | POA: Diagnosis not present

## 2013-04-08 DIAGNOSIS — G4700 Insomnia, unspecified: Secondary | ICD-10-CM | POA: Diagnosis not present

## 2013-04-08 DIAGNOSIS — F329 Major depressive disorder, single episode, unspecified: Secondary | ICD-10-CM | POA: Diagnosis not present

## 2013-04-08 DIAGNOSIS — N189 Chronic kidney disease, unspecified: Secondary | ICD-10-CM | POA: Diagnosis not present

## 2013-04-08 DIAGNOSIS — M6281 Muscle weakness (generalized): Secondary | ICD-10-CM | POA: Diagnosis not present

## 2013-04-08 DIAGNOSIS — E1149 Type 2 diabetes mellitus with other diabetic neurological complication: Secondary | ICD-10-CM | POA: Diagnosis not present

## 2013-04-08 DIAGNOSIS — I6789 Other cerebrovascular disease: Secondary | ICD-10-CM | POA: Diagnosis not present

## 2013-04-08 DIAGNOSIS — L03119 Cellulitis of unspecified part of limb: Secondary | ICD-10-CM | POA: Diagnosis not present

## 2013-04-08 DIAGNOSIS — R51 Headache: Secondary | ICD-10-CM | POA: Diagnosis not present

## 2013-04-08 DIAGNOSIS — F17200 Nicotine dependence, unspecified, uncomplicated: Secondary | ICD-10-CM | POA: Diagnosis not present

## 2013-04-08 DIAGNOSIS — K219 Gastro-esophageal reflux disease without esophagitis: Secondary | ICD-10-CM | POA: Diagnosis not present

## 2013-04-08 DIAGNOSIS — E119 Type 2 diabetes mellitus without complications: Secondary | ICD-10-CM | POA: Diagnosis not present

## 2013-04-08 DIAGNOSIS — I872 Venous insufficiency (chronic) (peripheral): Secondary | ICD-10-CM | POA: Diagnosis not present

## 2013-04-08 DIAGNOSIS — E1059 Type 1 diabetes mellitus with other circulatory complications: Secondary | ICD-10-CM | POA: Diagnosis not present

## 2013-04-08 DIAGNOSIS — Z8673 Personal history of transient ischemic attack (TIA), and cerebral infarction without residual deficits: Secondary | ICD-10-CM | POA: Diagnosis not present

## 2013-04-08 DIAGNOSIS — Z8679 Personal history of other diseases of the circulatory system: Secondary | ICD-10-CM | POA: Diagnosis not present

## 2013-04-08 DIAGNOSIS — M79609 Pain in unspecified limb: Secondary | ICD-10-CM | POA: Diagnosis not present

## 2013-04-08 DIAGNOSIS — M25559 Pain in unspecified hip: Secondary | ICD-10-CM | POA: Diagnosis not present

## 2013-04-08 DIAGNOSIS — K863 Pseudocyst of pancreas: Secondary | ICD-10-CM | POA: Diagnosis not present

## 2013-04-08 DIAGNOSIS — R262 Difficulty in walking, not elsewhere classified: Secondary | ICD-10-CM | POA: Diagnosis not present

## 2013-04-08 DIAGNOSIS — I10 Essential (primary) hypertension: Secondary | ICD-10-CM | POA: Diagnosis not present

## 2013-04-08 DIAGNOSIS — E1142 Type 2 diabetes mellitus with diabetic polyneuropathy: Secondary | ICD-10-CM | POA: Diagnosis not present

## 2013-04-08 LAB — GLUCOSE, CAPILLARY: Glucose-Capillary: 190 mg/dL — ABNORMAL HIGH (ref 70–99)

## 2013-04-08 MED ORDER — HYDROCODONE-ACETAMINOPHEN 10-325 MG PO TABS: 1.0000 | ORAL_TABLET | Freq: Three times a day (TID) | ORAL | Status: DC | PRN

## 2013-04-08 MED ORDER — DSS 100 MG PO CAPS: 100.0000 mg | ORAL_CAPSULE | Freq: Two times a day (BID) | ORAL | Status: DC

## 2013-04-08 MED ORDER — SENNA 8.6 MG PO TABS: 2.0000 | ORAL_TABLET | Freq: Two times a day (BID) | ORAL | Status: DC

## 2013-04-08 MED ORDER — DOXYCYCLINE HYCLATE 50 MG PO CAPS: 50.0000 mg | ORAL_CAPSULE | Freq: Two times a day (BID) | ORAL | Status: DC

## 2013-04-08 NOTE — Discharge Summary (Signed)
Physician Discharge Summary  NAME:Jared Hall  JXB:[id number]  DOB: [birth year]   Admit date: 04/04/2013 Discharge date: 04/08/2013  Discharge Diagnoses:  Active Problems:   DM (diabetes mellitus)   Renal insufficiency   History of CVA (cerebrovascular accident)   AKI (acute kidney injury)   Cellulitis of leg   Right hip pain   Essential hypertension, benign   Discharge Physical Exam:  General Appearance: Alert, cooperative, no distress, appears stated age  Weight change:   Intake/Output Summary (Last 24 hours) at 04/08/13 0827 Last data filed at 04/08/13 0500  Gross per 24 hour  Intake    360 ml  Output   2675 ml  Net  -2315 ml   Filed Vitals:   04/07/13 1102 04/07/13 1400 04/07/13 2051 04/08/13 0528  BP: 157/77 180/90 177/94 156/85  Pulse: 90 91 81 88  Temp: 98.5 F (36.9 C) 97.5 F (36.4 C) 98.3 F (36.8 C) 98.3 F (36.8 C)  TempSrc: Oral  Oral Oral  Resp:  20 18 20   Height:      Weight:      SpO2: 90% 94% 93% 91%   General Appearance: Alert, cooperative, no distress, appears stated age, appears fatigued  Lungs: Clear to auscultation bilaterally, respirations unlabored  Heart: Regular rate and rhythm, S1 and S2 normal, no murmur, rub or gallop  Abdomen: Soft, non-tender, bowel sounds active all four quadrants, no masses, no organomegaly  Extremities: edema improved with still peripheral erythema. Denuded blister on left posterior leg  Neuro: Alert, nonfocal  Discharge Condition: Improved  Hospital Course: Very pleasant 79 year old male with multiple medical problems including type 2 diabetes, lower extremity venous insufficiency, prior CVA, hypertension, who presented with recurrent lower extremity saline is not responding to oral antibiotics.  He also has a blisterlike lesion on the posterior aspect of the left leg which will need continuing topical therapy for healing.  Issues during this hospitalization include:  Failed outpatient treatment for cellulitis  in a patient with chronic lower extremity edema, venous stasis dermatitis. He does have a denuded blister posterior left calf which is drying up. Continue oral doxycycline for 7 days.  Elevate legs at all times when not ambulating or eating meals  Right hip pain, patient denies any history of fall, x-ray and mri negative for pathology except for some slight muscle edema in the hip area.  Probable strain. PT recommending skilled nursing facility for further physical therapy - probable muscular strain  Bilateral lower extremity edema 2-3+ - improved with leg elevation. - Now 1+  Diabetes - continue carb modified diet and NPH twice a day Hypercholesterolemia  Hypertension  CT abnormality of the pancreas, cystic lesion . Most likely a pseudocyst versus less likely an indolent neoplasm such as intraductal papillary mucinous tumor. Recommend follow-up with pre and post contrast abdominal MRI at 1 year  Disposition - for rehabilitation skilled nursing facility and to complete therapy for lower shin cellulitis   Things to follow up in the outpatient setting:  Would follow blood sugars twice a day and add sliding scale NovoLog insulin if necessary.  Consults: Physical therapy  Disposition: Short-term skilled nursing facility rehabilitation with hopes to transfer home in 2-3 weeks  Discharge Orders   Future Orders Complete By Expires     Call MD for:  difficulty breathing, headache or visual disturbances  As directed     Call MD for:  redness, tenderness, or signs of infection (pain, swelling, redness, odor or green/yellow discharge around incision site)  As directed     Call MD for:  severe uncontrolled pain  As directed     Call MD for:  temperature >100.4  As directed     Diet - low sodium heart healthy  As directed     Discharge wound care:  As directed     Comments:      Mepilex to left leg wound and change daily    Increase activity slowly  As directed         Medication List    STOP  taking these medications       clotrimazole-betamethasone cream  Commonly known as:  LOTRISONE     HYDROcodone-acetaminophen 5-500 MG per tablet  Commonly known as:  VICODIN      TAKE these medications       aspirin EC 81 MG tablet  Take 81 mg by mouth daily.     buPROPion 150 MG 12 hr tablet  Commonly known as:  WELLBUTRIN SR  Take 150 mg by mouth daily.     clopidogrel 75 MG tablet  Commonly known as:  PLAVIX  Take 75 mg by mouth daily.     doxycycline 50 MG capsule  Commonly known as:  VIBRAMYCIN  Take 1 capsule (50 mg total) by mouth 2 (two) times daily.     DSS 100 MG Caps  Take 100 mg by mouth 2 (two) times daily.     furosemide 80 MG tablet  Commonly known as:  LASIX  Take 40 mg by mouth daily.     HYDROcodone-acetaminophen 10-325 MG per tablet  Commonly known as:  NORCO  Take 1 tablet by mouth every 8 (eight) hours as needed.     insulin glargine 100 UNIT/ML injection  Commonly known as:  LANTUS  Inject 60 Units into the skin 2 (two) times daily.     pantoprazole 40 MG tablet  Commonly known as:  PROTONIX  Take 40 mg by mouth daily.     potassium chloride SA 20 MEQ tablet  Commonly known as:  K-DUR,KLOR-CON  Take 20 mEq by mouth daily.     pregabalin 50 MG capsule  Commonly known as:  LYRICA  Take 50 mg by mouth 2 (two) times daily.     primidone 50 MG tablet  Commonly known as:  MYSOLINE  Take 25 mg by mouth 2 (two) times daily.     senna 8.6 MG Tabs  Commonly known as:  SENOKOT  Take 2 tablets (17.2 mg total) by mouth 2 (two) times daily.     simvastatin 20 MG tablet  Commonly known as:  ZOCOR  Take 20 mg by mouth every evening.     terazosin 2 MG capsule  Commonly known as:  HYTRIN  Take 4 mg by mouth daily.     VITAMIN B COMPLEX PO  Take 1 tablet by mouth daily.     vitamin B-12 1000 MCG tablet  Commonly known as:  CYANOCOBALAMIN  Take 1,000 mcg by mouth daily.     zolpidem 10 MG tablet  Commonly known as:  AMBIEN  Take 10 mg  by mouth at bedtime as needed. For insomnia         The results of significant diagnostics from this hospitalization (including imaging, microbiology, ancillary and laboratory) are listed below for reference.    Significant Diagnostic Studies: Dg Hip Complete Right  04/04/2013   *RADIOLOGY REPORT*  Clinical Data: Right hip pain without trauma  RIGHT HIP - COMPLETE 2+ VIEW  Comparison: None  Findings: Negative for fracture.  Mild cartilage loss in the medial acetabulum.  Negative for AVN.  No focal bony lesion.  IMPRESSION: No acute abnormality.   Original Report Authenticated By: Janeece Riggers, M.D.   Ct Abdomen Pelvis W Contrast  04/04/2013   *RADIOLOGY REPORT*  Clinical Data: Right lower quadrant pain.  Sweating.  Chills. History of umbilical hernia repair and prostate cancer.  Diabetes.  CT ABDOMEN AND PELVIS WITH CONTRAST  Technique:  Multidetector CT imaging of the abdomen and pelvis was performed following the standard protocol during bolus administration of intravenous contrast.  Contrast: OMNIPAQUE IOHEXOL 300 MG/ML  SOLN  Comparison: 12/13/2011.  Findings: Lung bases:  Mild motion degradation at the lung bases. A calcified granuloma at the right lung base.  Mild scarring at the left lung base.  Mild cardiomegaly with aortic valvular calcifications. No pericardial or pleural effusion.  Abdomen/pelvis:  Normal liver, spleen, stomach, gallbladder, biliary tract, right adrenal gland.  Minimal left adrenal thickening is unchanged. A cystic lesion within the pancreatic body measures 1.5 cm on image 39/series 2.  Right renal cyst with bilateral too small to characterize renal lesions.  Small retroperitoneal nodes.  The largest is in the aortocaval space measures 1.2 cm on image 50/series 2.  This is similar to the 12/13/2011 exam, suggesting a reactive etiology.  Moderate stool within the rectum.  Normal terminal ileum and appendix.  Normal small bowel without abdominal ascites.  Fat containing  right inguinal hernia. No pelvic adenopathy.  Normal urinary bladder.  Radiation seeds in the prostate. No significant free fluid.  Surgical changes about the anterior abdominal wall with secondary fascial thickening.No acute osseous abnormality.  Bones/Musculoskeletal:  No acute osseous abnormality.  IMPRESSION:  1. No acute process in the abdomen or pelvis. 2.  Stool within the rectum, suggesting constipation or possibly fecal impaction. 3.  Aortic valvular calcifications.  Consider echocardiography non emergently to exclude valvular pathology. 4.  Cystic lesion within the pancreatic body which measures 1.5 cm. Most likely a pseudocyst versus less likely an indolent neoplasm such as intraductal papillary mucinous tumor.  Recommend follow-up with pre and post contrast abdominal MRI at 1 year. This recommendation follows ACR consensus guidelines:  Managing Incidental Findings on Abdominal CT:  White Paper of the ACR Incidental Findings Committee.  [facility] [phone]   Original Report Authenticated By: Jeronimo Greaves, M.D.   Mr Hip Right Wo Contrast  04/06/2013   *RADIOLOGY REPORT*  Clinical Data: Right hip pain.  MRI OF THE RIGHT HIP WITHOUT CONTRAST  Technique:  Multiplanar, multisequence MR imaging was performed. No intravenous contrast was administered.  Comparison: Radiographs and CT scan dated 04/04/2013  Findings: There is slight edema around the distal right gluteus minimus muscle as well as around the proximal right biceps femoris muscle anterior to the right hip.  On the coronal images there is slight edema in the right vastus lateralis muscle more distally.  The osseous structures of the right hip are normal except for minimal degenerative changes of the anterior-superior aspect of the acetabulum and slight medial joint space narrowing.  The appearance is similar to the opposite side.  The pelvic bones are normal. There are no mass lesions and there is no adenopathy.  No joint effusions.   IMPRESSION:  1. Minimal degenerative changes of the right hip, similar to the opposite side.  No acute abnormality of the hip joint. 2.  Slight nonspecific edema of the distal right gluteus minimus muscle  and the proximal biceps femoris and vastus lateralis muscles. This might represent  slight strains of those muscles.   Original Report Authenticated By: Francene Boyers, M.D.    Microbiology: Recent Results (from the past 240 hour(s))  URINE CULTURE     Status: None   Collection Time    04/04/13 11:20 AM      Result Value Range Status   Specimen Description URINE, CLEAN CATCH   Final   Special Requests NONE   Final   Culture  Setup Time 04/04/2013 12:18   Final   Colony Count 40,000 COLONIES/ML   Final   Culture [facility]ENCIA RETTGERI   Final   Report Status 04/06/2013 FINAL   Final   Organism ID, Bacteria PROVIDENCIA RETTGERI   Final  CULTURE, BLOOD (ROUTINE X 2)     Status: None   Collection Time    04/04/13 11:40 AM      Result Value Range Status   Specimen Description BLOOD LEFT ARM   Final   Special Requests BOTTLES DRAWN AEROBIC ONLY 10CC   Final   Culture  Setup Time 04/04/2013 18:31   Final   Culture     Final   Value:        BLOOD CULTURE RECEIVED NO GROWTH TO DATE CULTURE WILL BE HELD FOR 5 DAYS BEFORE ISSUING A FINAL NEGATIVE REPORT   Report Status PENDING   Incomplete  CULTURE, BLOOD (ROUTINE X 2)     Status: None   Collection Time    04/04/13 11:53 AM      Result Value Range Status   Specimen Description BLOOD RIGHT ANTECUBITAL   Final   Special Requests BOTTLES DRAWN AEROBIC ONLY 11CC   Final   Culture  Setup Time 04/04/2013 18:33   Final   Culture     Final   Value:        BLOOD CULTURE RECEIVED NO GROWTH TO DATE CULTURE WILL BE HELD FOR 5 DAYS BEFORE ISSUING A FINAL NEGATIVE REPORT   Report Status PENDING   Incomplete     Labs: Results for orders placed during the hospital encounter of 04/04/13  CULTURE, BLOOD (ROUTINE X 2)      Result Value Range   Specimen  Description BLOOD LEFT ARM     Special Requests BOTTLES DRAWN AEROBIC ONLY 10CC     Culture  Setup Time 04/04/2013 18:31     Culture       Value:        BLOOD CULTURE RECEIVED NO GROWTH TO DATE CULTURE WILL BE HELD FOR 5 DAYS BEFORE ISSUING A FINAL NEGATIVE REPORT   Report Status PENDING    CULTURE, BLOOD (ROUTINE X 2)      Result Value Range   Specimen Description BLOOD RIGHT ANTECUBITAL     Special Requests BOTTLES DRAWN AEROBIC ONLY 11CC     Culture  Setup Time 04/04/2013 18:33     Culture       Value:        BLOOD CULTURE RECEIVED NO GROWTH TO DATE CULTURE WILL BE HELD FOR 5 DAYS BEFORE ISSUING A FINAL NEGATIVE REPORT   Report Status PENDING    URINE CULTURE      Result Value Range   Specimen Description URINE, CLEAN CATCH     Special Requests NONE     Culture  Setup Time 04/04/2013 12:18     Colony Count 40,000 COLONIES/ML     Culture PROVIDENCIA RETTGERI     Report Status 04/06/2013  FINAL     Organism ID, Bacteria PROVIDENCIA RETTGERI    CBC WITH DIFFERENTIAL      Result Value Range   WBC 10.0  4.0 - 10.5 K/uL   RBC 6.08 (*) 4.22 - 5.81 MIL/uL   Hemoglobin 13.2  13.0 - 17.0 g/dL   HCT 40.9  81.1 - 91.4 %   MCV 67.1 (*) 78.0 - 100.0 fL   MCH 21.7 (*) 26.0 - 34.0 pg   MCHC 32.4  30.0 - 36.0 g/dL   RDW 78.2 (*) 95.6 - 21.3 %   Platelets 193  150 - 400 K/uL   Neutrophils Relative % 75  43 - 77 %   Lymphocytes Relative 14  12 - 46 %   Monocytes Relative 8  3 - 12 %   Eosinophils Relative 3  0 - 5 %   Basophils Relative 0  0 - 1 %   Neutro Abs 7.5  1.7 - 7.7 K/uL   Lymphs Abs 1.4  0.7 - 4.0 K/uL   Monocytes Absolute 0.8  0.1 - 1.0 K/uL   Eosinophils Absolute 0.3  0.0 - 0.7 K/uL   Basophils Absolute 0.0  0.0 - 0.1 K/uL   RBC Morphology ELLIPTOCYTES    COMPREHENSIVE METABOLIC PANEL      Result Value Range   Sodium 142  135 - 145 mEq/L   Potassium 4.3  3.5 - 5.1 mEq/L   Chloride 106  96 - 112 mEq/L   CO2 29  19 - 32 mEq/L   Glucose, Bld 113 (*) 70 - 99 mg/dL   BUN 18   6 - 23 mg/dL   Creatinine, Ser 0.86  0.50 - 1.35 mg/dL   Calcium 9.8  8.4 - 57.8 mg/dL   Total Protein 7.1  6.0 - 8.3 g/dL   Albumin 3.7  3.5 - 5.2 g/dL   AST 26  0 - 37 U/L   ALT 21  0 - 53 U/L   Alkaline Phosphatase 73  39 - 117 U/L   Total Bilirubin 0.4  0.3 - 1.2 mg/dL   GFR calc non Af Amer 49 (*) >90 mL/min   GFR calc Af Amer 57 (*) >90 mL/min  LIPASE, BLOOD      Result Value Range   Lipase 28  11 - 59 U/L  URINALYSIS, ROUTINE W REFLEX MICROSCOPIC      Result Value Range   Color, Urine YELLOW  YELLOW   APPearance CLEAR  CLEAR   Specific Gravity, Urine 1.007  1.005 - 1.030   pH 7.5  5.0 - 8.0   Glucose, UA NEGATIVE  NEGATIVE mg/dL   Hgb urine dipstick NEGATIVE  NEGATIVE   Bilirubin Urine NEGATIVE  NEGATIVE   Ketones, ur NEGATIVE  NEGATIVE mg/dL   Protein, ur NEGATIVE  NEGATIVE mg/dL   Urobilinogen, UA 0.2  0.0 - 1.0 mg/dL   Nitrite NEGATIVE  NEGATIVE   Leukocytes, UA NEGATIVE  NEGATIVE  GLUCOSE, CAPILLARY      Result Value Range   Glucose-Capillary 99  70 - 99 mg/dL   Comment 1 Documented in Chart     Comment 2 Notify RN    BASIC METABOLIC PANEL      Result Value Range   Sodium 141  135 - 145 mEq/L   Potassium 3.8  3.5 - 5.1 mEq/L   Chloride 108  96 - 112 mEq/L   CO2 23  19 - 32 mEq/L   Glucose, Bld 100 (*) 70 - 99 mg/dL  BUN 12  6 - 23 mg/dL   Creatinine, Ser 1.61  0.50 - 1.35 mg/dL   Calcium 8.5  8.4 - 09.6 mg/dL   GFR calc non Af Amer 67 (*) >90 mL/min   GFR calc Af Amer 78 (*) >90 mL/min  CBC      Result Value Range   WBC 7.6  4.0 - 10.5 K/uL   RBC 5.34  4.22 - 5.81 MIL/uL   Hemoglobin 11.4 (*) 13.0 - 17.0 g/dL   HCT 04.5 (*) 40.9 - 81.1 %   MCV 67.4 (*) 78.0 - 100.0 fL   MCH 21.3 (*) 26.0 - 34.0 pg   MCHC 31.7  30.0 - 36.0 g/dL   RDW 91.4 (*) 78.2 - 95.6 %   Platelets 182  150 - 400 K/uL  HEMOGLOBIN A1C      Result Value Range   Hemoglobin A1C 8.3 (*) <5.7 %   Mean Plasma Glucose 192 (*) <117 mg/dL  GLUCOSE, CAPILLARY      Result Value Range    Glucose-Capillary 111 (*) 70 - 99 mg/dL  GLUCOSE, CAPILLARY      Result Value Range   Glucose-Capillary 156 (*) 70 - 99 mg/dL  GLUCOSE, CAPILLARY      Result Value Range   Glucose-Capillary 110 (*) 70 - 99 mg/dL  GLUCOSE, CAPILLARY      Result Value Range   Glucose-Capillary 159 (*) 70 - 99 mg/dL  GLUCOSE, CAPILLARY      Result Value Range   Glucose-Capillary 194 (*) 70 - 99 mg/dL  BASIC METABOLIC PANEL      Result Value Range   Sodium 142  135 - 145 mEq/L   Potassium 4.0  3.5 - 5.1 mEq/L   Chloride 106  96 - 112 mEq/L   CO2 27  19 - 32 mEq/L   Glucose, Bld 117 (*) 70 - 99 mg/dL   BUN 10  6 - 23 mg/dL   Creatinine, Ser 2.13  0.50 - 1.35 mg/dL   Calcium 9.1  8.4 - 08.6 mg/dL   GFR calc non Af Amer 58 (*) >90 mL/min   GFR calc Af Amer 67 (*) >90 mL/min  GLUCOSE, CAPILLARY      Result Value Range   Glucose-Capillary 125 (*) 70 - 99 mg/dL   Comment 1 Notify RN     Comment 2 Documented in Chart    GLUCOSE, CAPILLARY      Result Value Range   Glucose-Capillary 118 (*) 70 - 99 mg/dL  GLUCOSE, CAPILLARY      Result Value Range   Glucose-Capillary 180 (*) 70 - 99 mg/dL  BASIC METABOLIC PANEL      Result Value Range   Sodium 141  135 - 145 mEq/L   Potassium 3.5  3.5 - 5.1 mEq/L   Chloride 105  96 - 112 mEq/L   CO2 28  19 - 32 mEq/L   Glucose, Bld 139 (*) 70 - 99 mg/dL   BUN 13  6 - 23 mg/dL   Creatinine, Ser 5.78  0.50 - 1.35 mg/dL   Calcium 9.3  8.4 - 46.9 mg/dL   GFR calc non Af Amer 56 (*) >90 mL/min   GFR calc Af Amer 65 (*) >90 mL/min  GLUCOSE, CAPILLARY      Result Value Range   Glucose-Capillary 201 (*) 70 - 99 mg/dL  GLUCOSE, CAPILLARY      Result Value Range   Glucose-Capillary 171 (*) 70 - 99 mg/dL  GLUCOSE,  CAPILLARY      Result Value Range   Glucose-Capillary 156 (*) 70 - 99 mg/dL   Comment 1 Documented in Chart     Comment 2 Notify RN    GLUCOSE, CAPILLARY      Result Value Range   Glucose-Capillary 184 (*) 70 - 99 mg/dL   Comment 1 Documented in Chart      Comment 2 Notify RN    GLUCOSE, CAPILLARY      Result Value Range   Glucose-Capillary 149 (*) 70 - 99 mg/dL   Comment 1 Documented in Chart     Comment 2 Notify RN    GLUCOSE, CAPILLARY      Result Value Range   Glucose-Capillary 130 (*) 70 - 99 mg/dL   Comment 1 Documented in Chart     Comment 2 Notify RN    GLUCOSE, CAPILLARY      Result Value Range   Glucose-Capillary 109 (*) 70 - 99 mg/dL  CG4 I-STAT (LACTIC ACID)      Result Value Range   Lactic Acid, Venous 0.77  0.5 - 2.2 mmol/L    Time coordinating discharge: 40 minutes  Signed: Pearla Dubonnet, MD 04/08/2013, 8:27 AM

## 2013-04-08 NOTE — Progress Notes (Signed)
Physical Therapy Treatment Patient Details Name: Jared Hall MRN: [id number] DOB: 1931-07-27 Today's Date: 04/08/2013 Time: 4098-1191 PT Time Calculation (min): 32 min  PT Assessment / Plan / Recommendation Comments on Treatment Session  Continues steady improvement.  Ready for incraeased frequency and duration of PT at SNF    Follow Up Recommendations  SNF     Does the patient have the potential to tolerate intense rehabilitation     Barriers to Discharge        Equipment Recommendations  None recommended by PT    Recommendations for Other Services OT consult  Frequency Min 3X/week   Plan Discharge plan remains appropriate    Precautions / Restrictions Precautions Precautions: Fall Restrictions Weight Bearing Restrictions: No   Pertinent Vitals/Pain Pain in right leg continues, but is lessened    Mobility  Bed Mobility Details for Bed Mobility Assistance: Pt up in chair per nursing Transfers Transfers: Sit to Stand;Stand to Sit Sit to Stand: 5: Supervision;4: Min guard Stand to Sit: 5: Supervision;4: Min guard Details for Transfer Assistance: Pt using arms to push up on armrests Pt improved in indenpendence during course of treatment Ambulation/Gait Ambulation/Gait Assistance: 4: Min guard Ambulation Distance (Feet): 500 Feet (3 trials totalling 500 feet, sitting rests between) Assistive device: Rolling walker (wide) Ambulation/Gait Assistance Details: cues to keep chest up and hips extended to step up into walker to lessen weight bearing through arms Gait Pattern: Decreased step length - right;Decreased step length - left;Trunk flexed Gait velocity: wfl General Gait Details: pt continues to toe extension when feet are on ground and admits to difficulty feeling his feet. Pt with edema in legs.  Pt was better able to stand on legs and extend hips at end of sesion Stairs: No Wheelchair Mobility Wheelchair Mobility: No    Exercises General Exercises - Lower  Extremity Ankle Circles/Pumps: AROM;Both;Seated;15 reps Long Arc Quad: AROM;Both;Seated;15 reps Hip Flexion/Marching: AROM;Both;Standing;Seated;15 reps   PT Diagnosis:    PT Problem List:   PT Treatment Interventions:     PT Goals Acute Rehab PT Goals PT Goal Formulation: With patient Time For Goal Achievement: 04/20/13 Potential to Achieve Goals: Good Pt will go Supine/Side to Sit: Independently Pt will go Sit to Supine/Side: Independently Pt will go Sit to Stand: with modified independence PT Goal: Sit to Stand - Progress: Progressing toward goal Pt will go Stand to Sit: with modified independence PT Goal: Stand to Sit - Progress: Progressing toward goal Pt will Ambulate: >150 feet;with modified independence PT Goal: Ambulate - Progress: Progressing toward goal  Visit Information  Last PT Received On: 04/08/13 Assistance Needed: +1    Subjective Data  Subjective: "If I don't see you again, thank you for helping me" Patient Stated Goal: to go to rehab today   Cognition  Cognition Arousal/Alertness: Awake/alert Behavior During Therapy: WFL for tasks assessed/performed Overall Cognitive Status: Within Functional Limits for tasks assessed    Balance  Balance Balance Assessed: Yes Static Sitting Balance Static Sitting - Balance Support: No upper extremity supported Static Sitting - Level of Assistance: 7: Independent Static Standing Balance Static Standing - Balance Support: Bilateral upper extremity supported;Right upper extremity supported;Left upper extremity supported;No upper extremity supported Static Standing - Level of Assistance: 5: Stand by assistance Static Standing - Comment/# of Minutes: pt able to work on extending hips to activate core muscles and improve standing balance  End of Session PT - End of Session Equipment Utilized During Treatment: Gait belt Activity Tolerance: Patient tolerated treatment well  Patient left: in chair;with call bell/phone within  reach Nurse Communication: Mobility status   GP    Bayard Hugger. Manson Passey, [place] [phone] 04/08/2013, 10:11 AM

## 2013-04-08 NOTE — Care Management Note (Signed)
    Page 1 of 1   04/08/2013     4:35:48 PM   CARE MANAGEMENT NOTE 04/08/2013  Patient:  Jared Hall, Jared Hall   Account Number:  [id number]  Date Initiated:  04/08/2013  Documentation initiated by:  Letha Cape  Subjective/Objective Assessment:   dx dm  admit- lives alone.     Action/Plan:   pt rec snf   Anticipated DC Date:  04/08/2013   Anticipated DC Plan:  SKILLED NURSING FACILITY  In-house referral  Clinical Social Worker      DC Planning Services  CM consult      Choice offered to / List presented to:             Status of service:  Completed, signed off Medicare Important Message given?   (If response is "NO", the following Medicare IM given date fields will be blank) Date Medicare IM given:   Date Additional Medicare IM given:    Discharge Disposition:  SKILLED NURSING FACILITY  Per UR Regulation:  Reviewed for med. necessity/level of care/duration of stay  If discussed at Long Length of Stay Meetings, dates discussed:    Comments:  04/08/13 16:34 Letha Cape RN, BSN [phone] patient lives alone, per physical therapy recs snf, pt dc to snf today, CSW following.

## 2013-04-08 NOTE — Clinical Social Work Psychosocial (Signed)
Clinical Social Work Department BRIEF PSYCHOSOCIAL ASSESSMENT 04/08/2013  Patient:  Jared Hall, Jared Hall     Account Number:  [id number]     Admit date:  04/04/2013  Clinical Social Worker:  Skip Mayer  Date/Time:  04/08/2013 10:00 AM  Referred by:  Physician  Date Referred:  04/08/2013 Referred for  SNF Placement   Other Referral:   Interview type:  Patient Other interview type:    PSYCHOSOCIAL DATA Living Status:  ALONE Admitted from facility:   Level of care:   Primary support name:  Casimiro Needle Primary support relationship to patient:  CHILD, ADULT Degree of support available:   Adequate, per pt    CURRENT CONCERNS Current Concerns  Post-Acute Placement   Other Concerns:    SOCIAL WORK ASSESSMENT / PLAN CSW met with pt re: Pt recommendation for SNF.  CSW explained placement process and answered questions.  Pt reports [facility] as preference but agreeable to full [facility] and [facility] searches to maximize options.  CSW initiated SNF search.  Will f/u with bed offers and facilitate d/c to SNF today.   Assessment/plan status:  Information/Referral to [facility] Other assessment/ plan:   Information/referral to community resources:   PTAR  SNF    PATIENT'S/FAMILY'S RESPONSE TO PLAN OF CARE: Pt reports agreeable to ST-SNF in order to increase strength and independence with mobility prior to return home alone.  Pt verbalized understanding of placement process and appreciation for CSW assist.       Dellie Burns, MSW, LCSWA [phone] (coverage)

## 2013-04-08 NOTE — Progress Notes (Addendum)
Nsg Discharge Note  Admit Date:  04/04/2013 Discharge date: 04/08/2013   Truddie Coco to be D/C'd Nursing Home per MD order.  AVS completed.  Copy for chart, and copy for patient signed, and dated. Patient/caregiver able to verbalize understanding.  Discharge Medication:   Medication List    STOP taking these medications       clotrimazole-betamethasone cream  Commonly known as:  LOTRISONE     HYDROcodone-acetaminophen 5-500 MG per tablet  Commonly known as:  VICODIN      TAKE these medications       aspirin EC 81 MG tablet  Take 81 mg by mouth daily.     buPROPion 150 MG 12 hr tablet  Commonly known as:  WELLBUTRIN SR  Take 150 mg by mouth daily.     clopidogrel 75 MG tablet  Commonly known as:  PLAVIX  Take 75 mg by mouth daily.     doxycycline 50 MG capsule  Commonly known as:  VIBRAMYCIN  Take 1 capsule (50 mg total) by mouth 2 (two) times daily.     DSS 100 MG Caps  Take 100 mg by mouth 2 (two) times daily.     furosemide 80 MG tablet  Commonly known as:  LASIX  Take 40 mg by mouth daily.     HYDROcodone-acetaminophen 10-325 MG per tablet  Commonly known as:  NORCO  Take 1 tablet by mouth every 8 (eight) hours as needed.     insulin glargine 100 UNIT/ML injection  Commonly known as:  LANTUS  Inject 60 Units into the skin 2 (two) times daily.     pantoprazole 40 MG tablet  Commonly known as:  PROTONIX  Take 40 mg by mouth daily.     potassium chloride SA 20 MEQ tablet  Commonly known as:  K-DUR,KLOR-CON  Take 20 mEq by mouth daily.     pregabalin 50 MG capsule  Commonly known as:  LYRICA  Take 50 mg by mouth 2 (two) times daily.     primidone 50 MG tablet  Commonly known as:  MYSOLINE  Take 25 mg by mouth 2 (two) times daily.     senna 8.6 MG Tabs  Commonly known as:  SENOKOT  Take 2 tablets (17.2 mg total) by mouth 2 (two) times daily.     simvastatin 20 MG tablet  Commonly known as:  ZOCOR  Take 20 mg by mouth every evening.     terazosin 2 MG capsule  Commonly known as:  HYTRIN  Take 4 mg by mouth daily.     VITAMIN B COMPLEX PO  Take 1 tablet by mouth daily.     vitamin B-12 1000 MCG tablet  Commonly known as:  CYANOCOBALAMIN  Take 1,000 mcg by mouth daily.     zolpidem 10 MG tablet  Commonly known as:  AMBIEN  Take 10 mg by mouth at bedtime as needed. For insomnia        Discharge Assessment: Filed Vitals:   04/08/13 1421  BP: 166/77  Pulse: 90  Temp: 97.2 F (36.2 C)  Resp: 18   Skin has tear on back of LLE with foam dressing. IV catheter discontinued intact. Site without signs and symptoms of complications - no redness or edema noted at insertion site, patient denies c/o pain - only slight tenderness at site.  Dressing with slight pressure applied.  D/c Instructions-Education: Discharge instructions given to patient/family with verbalized understanding. D/c education completed with patient/family including follow up instructions, medication  list, d/c activities limitations if indicated, with other d/c instructions as indicated by MD - patient able to verbalize understanding, all questions fully answered. Patient instructed to return to ED, call 911, or call MD for any changes in condition.  Patient escorted via WC, and D/C home via private auto.  Hikari Tripp Consuella Lose, RN 04/08/2013 4:00 PM

## 2013-04-08 NOTE — Clinical Social Work Placement (Addendum)
Clinical Social Work Department CLINICAL SOCIAL WORK PLACEMENT NOTE 04/08/2013  Patient:  Jared Hall, Jared Hall  Account Number:  [id number] Admit date:  04/04/2013  Clinical Social Worker:  Skip Mayer  Date/time:  04/08/2013 10:00 AM  Clinical Social Work is seeking post-discharge placement for this patient at the following level of care:   SKILLED NURSING   (*CSW will update this form in Epic as items are completed)   04/08/2013  Patient/family provided with [facility] Department of Clinical Social Work's list of facilities offering this level of care within the geographic area requested by the patient (or if unable, by the patient's family).  04/08/2013  Patient/family informed of their freedom to choose among providers that offer the needed level of care, that participate in Medicare, Medicaid or managed care program needed by the patient, have an available bed and are willing to accept the patient.  04/08/2013  Patient/family informed of [facility]' ownership interest in [facility], as well as of the fact that they are under no obligation to receive care at this facility.  PASARR submitted to EDS on 04/08/2013 PASARR number received from EDS on 04/08/2013  FL2 transmitted to all facilities in geographic area requested by pt/family on  04/08/2013 FL2 transmitted to all facilities within larger geographic area on   Patient informed that his/her managed care company has contracts with or will negotiate with  certain facilities, including the following:     Patient/family informed of bed offers received:  04/08/13 Dorma Russell) Patient chooses bed at [facility]) Physician recommends and patient chooses bed at  SNF  Patient to be transferred to  on  04/08/13 (JB) Patient to be transferred to facility by Darnell Level)  The following physician request were entered in Epic:   Additional Comments:

## 2013-04-09 ENCOUNTER — Non-Acute Institutional Stay (SKILLED_NURSING_FACILITY): Payer: Medicare Other | Admitting: Internal Medicine

## 2013-04-09 DIAGNOSIS — E1059 Type 1 diabetes mellitus with other circulatory complications: Secondary | ICD-10-CM

## 2013-04-09 DIAGNOSIS — Z8673 Personal history of transient ischemic attack (TIA), and cerebral infarction without residual deficits: Secondary | ICD-10-CM

## 2013-04-09 DIAGNOSIS — I10 Essential (primary) hypertension: Secondary | ICD-10-CM

## 2013-04-09 DIAGNOSIS — R51 Headache: Secondary | ICD-10-CM | POA: Diagnosis not present

## 2013-04-10 LAB — CULTURE, BLOOD (ROUTINE X 2)

## 2013-04-22 ENCOUNTER — Non-Acute Institutional Stay (SKILLED_NURSING_FACILITY): Payer: Medicare Other | Admitting: Adult Health

## 2013-04-22 DIAGNOSIS — L03119 Cellulitis of unspecified part of limb: Secondary | ICD-10-CM

## 2013-04-22 DIAGNOSIS — L02419 Cutaneous abscess of limb, unspecified: Secondary | ICD-10-CM | POA: Diagnosis not present

## 2013-04-22 DIAGNOSIS — F329 Major depressive disorder, single episode, unspecified: Secondary | ICD-10-CM | POA: Diagnosis not present

## 2013-04-22 DIAGNOSIS — I10 Essential (primary) hypertension: Secondary | ICD-10-CM | POA: Diagnosis not present

## 2013-04-22 DIAGNOSIS — K219 Gastro-esophageal reflux disease without esophagitis: Secondary | ICD-10-CM | POA: Diagnosis not present

## 2013-04-23 ENCOUNTER — Other Ambulatory Visit: Payer: Self-pay | Admitting: *Deleted

## 2013-04-23 MED ORDER — HYDROCODONE-ACETAMINOPHEN 10-325 MG PO TABS: 1.0000 | ORAL_TABLET | Freq: Three times a day (TID) | ORAL | Status: DC | PRN

## 2013-04-28 ENCOUNTER — Other Ambulatory Visit: Payer: Self-pay | Admitting: *Deleted

## 2013-04-28 MED ORDER — HYDROCODONE-ACETAMINOPHEN 10-325 MG PO TABS: 1.0000 | ORAL_TABLET | Freq: Three times a day (TID) | ORAL | Status: DC | PRN

## 2013-05-02 DIAGNOSIS — E1051 Type 1 diabetes mellitus with diabetic peripheral angiopathy without gangrene: Secondary | ICD-10-CM | POA: Insufficient documentation

## 2013-05-02 NOTE — Progress Notes (Signed)
Patient ID: Jared Hall, male   DOB: 1931-03-01, 79 y.o.   MRN: [id number]        HISTORY & PHYSICAL  DATE:  04/09/2013   FACILITY: [facility]  LEVEL OF CARE: SNF (31)  ALLERGIES:  No Known Allergies  CHIEF COMPLAINT:  Manage diabetes mellitus, CVA, and hypertension.    HISTORY OF PRESENT ILLNESS:  The patient is an 79 year-old, Caucasian male who was hospitalized for left lower extremity cellulitis and then discharged to this facility for short-term rehabilitation.  He has the following problems:   DM:pt's DM remains stable.  Pt denies polyuria, polydipsia, polyphagia, changes in vision or hypoglycemic episodes.  No complications noted from the medication presently being used.  Last hemoglobin A1c is: A recent  hemoglobin A1C is not available.   CVA: The patient's CVA remains stable.  Patient denies new neurologic symptoms such as numbness, tingling, weakness, speech difficulties or visual disturbances.  No complications reported from the medications currently being used.   HTN: Pt 's HTN remains stable.  Denies CP, sob, DOE, pedal edema, headaches, dizziness or visual disturbances.  No complications from the medications currently being used.  Last BP : 179/96, 167/92.  PAST MEDICAL HISTORY :  Past Medical History  Diagnosis Date  . Umbilical hernia   . Diabetes mellitus   . Cancer     PAST SURGICAL HISTORY: Past Surgical History  Procedure Laterality Date  . Ventral hernia repair  12/18/2011    Procedure: HERNIA REPAIR VENTRAL ADULT;  Surgeon: Ernestene Mention, MD;  Location: [facility] OR;  Service: General;  Laterality: N/A;    SOCIAL HISTORY:  reports that he has never smoked. He has never used smokeless tobacco. He reports that he does not drink alcohol or use illicit drugs.  FAMILY HISTORY: None  CURRENT MEDICATIONS: Reviewed per [facility]  REVIEW OF SYSTEMS:   NEUROLOGICAL:   Complains of a headache, not relieved with Tylenol.    See HPI otherwise 14 point  ROS is negative.  PHYSICAL EXAMINATION  VS:  T 98.9       P 78       RR 18       BP 179/96      POX 94% room air        WT (Lb)  GENERAL: no acute distress, moderately obese body habitus EYES: conjunctivae normal, sclerae normal, normal eye lids MOUTH/THROAT: lips without lesions,no lesions in the mouth,tongue is without lesions,uvula elevates in midline NECK: supple, trachea midline, no neck masses, no thyroid tenderness, no thyromegaly LYMPHATICS: no LAN in the neck, no supraclavicular LAN RESPIRATORY: breathing is even & unlabored, BS CTAB CARDIAC: RRR, no murmur,no extra heart sounds EDEMA/VARICOSITIES:  +2 bilateral lower extremity edema  ARTERIAL:  pedal pulses nonpalpable  GI:  ABDOMEN: abdomen soft, normal BS, no masses, no tenderness  LIVER/SPLEEN: no hepatomegaly, no splenomegaly MUSCULOSKELETAL: HEAD: normal to inspection & palpation BACK: no kyphosis, scoliosis or spinal processes tenderness EXTREMITIES: LEFT UPPER EXTREMITY: full range of motion, normal strength & tone RIGHT UPPER EXTREMITY:  full range of motion, normal strength & tone LEFT LOWER EXTREMITY: strength intact, range of motion moderate  RIGHT LOWER EXTREMITY: strength intact, range of motion moderate  PSYCHIATRIC: the patient is alert & oriented to person, affect & behavior appropriate  LABS/RADIOLOGY: Right hip x-ray and MRI:  Negative for acute findings.    CT of the abdomen and pelvis showed a cystic lesion, most likely a pseudocyst.  Urine culture showed nonsignificant growth.    Blood culture x2 showed no growth.    CBC normal.   CMP normal.   Lipase 28.   Urinalysis negative.   BMP normal.   ASSESSMENT/PLAN:  Diabetes mellitus with vascular complications.  Continue current medications.   Hypertension.  Uncontrolled problem.  Start Lopressor 25 b.i.d.    CVA.  Stable.    Headache.  May use p.r.n. Norco.   Left lower extremity cellulitis.   Currently on doxycycline.     Peripheral neuropathy.  Continue Lyrica.   Hyperlipidemia.  Continue Zocor.    Check CBC and BMP.   I have reviewed patient's medical records received at admission/from hospitalization.  CPT CODE: 16109

## 2013-05-06 ENCOUNTER — Other Ambulatory Visit: Payer: Self-pay | Admitting: *Deleted

## 2013-05-06 MED ORDER — PREGABALIN 50 MG PO CAPS: ORAL_CAPSULE | ORAL | Status: DC

## 2013-05-07 ENCOUNTER — Non-Acute Institutional Stay (SKILLED_NURSING_FACILITY): Payer: Medicare Other | Admitting: Adult Health

## 2013-05-07 DIAGNOSIS — I10 Essential (primary) hypertension: Secondary | ICD-10-CM | POA: Diagnosis not present

## 2013-05-07 DIAGNOSIS — E119 Type 2 diabetes mellitus without complications: Secondary | ICD-10-CM

## 2013-05-07 DIAGNOSIS — F329 Major depressive disorder, single episode, unspecified: Secondary | ICD-10-CM

## 2013-05-07 DIAGNOSIS — E1142 Type 2 diabetes mellitus with diabetic polyneuropathy: Secondary | ICD-10-CM

## 2013-05-07 DIAGNOSIS — L02419 Cutaneous abscess of limb, unspecified: Secondary | ICD-10-CM | POA: Diagnosis not present

## 2013-05-07 DIAGNOSIS — M25559 Pain in unspecified hip: Secondary | ICD-10-CM

## 2013-05-07 DIAGNOSIS — K219 Gastro-esophageal reflux disease without esophagitis: Secondary | ICD-10-CM

## 2013-05-07 DIAGNOSIS — E785 Hyperlipidemia, unspecified: Secondary | ICD-10-CM

## 2013-05-07 DIAGNOSIS — M25551 Pain in right hip: Secondary | ICD-10-CM

## 2013-05-07 DIAGNOSIS — E1140 Type 2 diabetes mellitus with diabetic neuropathy, unspecified: Secondary | ICD-10-CM

## 2013-05-07 DIAGNOSIS — L03119 Cellulitis of unspecified part of limb: Secondary | ICD-10-CM

## 2013-05-07 DIAGNOSIS — K5900 Constipation, unspecified: Secondary | ICD-10-CM

## 2013-05-07 DIAGNOSIS — E1149 Type 2 diabetes mellitus with other diabetic neurological complication: Secondary | ICD-10-CM

## 2013-05-07 DIAGNOSIS — F32A Depression, unspecified: Secondary | ICD-10-CM

## 2013-05-13 DIAGNOSIS — E1129 Type 2 diabetes mellitus with other diabetic kidney complication: Secondary | ICD-10-CM

## 2013-05-13 DIAGNOSIS — M25559 Pain in unspecified hip: Secondary | ICD-10-CM | POA: Diagnosis not present

## 2013-05-13 DIAGNOSIS — N189 Chronic kidney disease, unspecified: Secondary | ICD-10-CM | POA: Diagnosis not present

## 2013-05-13 DIAGNOSIS — E1165 Type 2 diabetes mellitus with hyperglycemia: Secondary | ICD-10-CM | POA: Diagnosis not present

## 2013-05-13 DIAGNOSIS — E1159 Type 2 diabetes mellitus with other circulatory complications: Secondary | ICD-10-CM | POA: Diagnosis not present

## 2013-05-13 DIAGNOSIS — I10 Essential (primary) hypertension: Secondary | ICD-10-CM | POA: Diagnosis not present

## 2013-05-13 DIAGNOSIS — I798 Other disorders of arteries, arterioles and capillaries in diseases classified elsewhere: Secondary | ICD-10-CM

## 2013-05-13 DIAGNOSIS — L03119 Cellulitis of unspecified part of limb: Secondary | ICD-10-CM | POA: Diagnosis not present

## 2013-05-13 DIAGNOSIS — L02419 Cutaneous abscess of limb, unspecified: Secondary | ICD-10-CM | POA: Diagnosis not present

## 2013-05-13 DIAGNOSIS — Z602 Problems related to living alone: Secondary | ICD-10-CM | POA: Diagnosis not present

## 2013-05-15 DIAGNOSIS — I798 Other disorders of arteries, arterioles and capillaries in diseases classified elsewhere: Secondary | ICD-10-CM | POA: Diagnosis not present

## 2013-05-15 DIAGNOSIS — L03119 Cellulitis of unspecified part of limb: Secondary | ICD-10-CM | POA: Diagnosis not present

## 2013-05-15 DIAGNOSIS — I10 Essential (primary) hypertension: Secondary | ICD-10-CM | POA: Diagnosis not present

## 2013-05-15 DIAGNOSIS — E1159 Type 2 diabetes mellitus with other circulatory complications: Secondary | ICD-10-CM | POA: Diagnosis not present

## 2013-05-15 DIAGNOSIS — E1165 Type 2 diabetes mellitus with hyperglycemia: Secondary | ICD-10-CM | POA: Diagnosis not present

## 2013-05-15 DIAGNOSIS — L02419 Cutaneous abscess of limb, unspecified: Secondary | ICD-10-CM | POA: Diagnosis not present

## 2013-05-15 DIAGNOSIS — N189 Chronic kidney disease, unspecified: Secondary | ICD-10-CM | POA: Diagnosis not present

## 2013-05-19 DIAGNOSIS — I10 Essential (primary) hypertension: Secondary | ICD-10-CM | POA: Diagnosis not present

## 2013-05-19 DIAGNOSIS — I798 Other disorders of arteries, arterioles and capillaries in diseases classified elsewhere: Secondary | ICD-10-CM | POA: Diagnosis not present

## 2013-05-19 DIAGNOSIS — N189 Chronic kidney disease, unspecified: Secondary | ICD-10-CM | POA: Diagnosis not present

## 2013-05-19 DIAGNOSIS — L03119 Cellulitis of unspecified part of limb: Secondary | ICD-10-CM | POA: Diagnosis not present

## 2013-05-19 DIAGNOSIS — E1159 Type 2 diabetes mellitus with other circulatory complications: Secondary | ICD-10-CM | POA: Diagnosis not present

## 2013-05-19 DIAGNOSIS — E1129 Type 2 diabetes mellitus with other diabetic kidney complication: Secondary | ICD-10-CM | POA: Diagnosis not present

## 2013-05-20 DIAGNOSIS — L03119 Cellulitis of unspecified part of limb: Secondary | ICD-10-CM | POA: Diagnosis not present

## 2013-05-20 DIAGNOSIS — I10 Essential (primary) hypertension: Secondary | ICD-10-CM | POA: Diagnosis not present

## 2013-05-20 DIAGNOSIS — N189 Chronic kidney disease, unspecified: Secondary | ICD-10-CM | POA: Diagnosis not present

## 2013-05-20 DIAGNOSIS — I798 Other disorders of arteries, arterioles and capillaries in diseases classified elsewhere: Secondary | ICD-10-CM | POA: Diagnosis not present

## 2013-05-20 DIAGNOSIS — E1159 Type 2 diabetes mellitus with other circulatory complications: Secondary | ICD-10-CM | POA: Diagnosis not present

## 2013-05-20 DIAGNOSIS — E1165 Type 2 diabetes mellitus with hyperglycemia: Secondary | ICD-10-CM | POA: Diagnosis not present

## 2013-05-22 DIAGNOSIS — L02419 Cutaneous abscess of limb, unspecified: Secondary | ICD-10-CM | POA: Diagnosis not present

## 2013-05-22 DIAGNOSIS — L03119 Cellulitis of unspecified part of limb: Secondary | ICD-10-CM | POA: Diagnosis not present

## 2013-05-22 DIAGNOSIS — I798 Other disorders of arteries, arterioles and capillaries in diseases classified elsewhere: Secondary | ICD-10-CM | POA: Diagnosis not present

## 2013-05-22 DIAGNOSIS — N189 Chronic kidney disease, unspecified: Secondary | ICD-10-CM | POA: Diagnosis not present

## 2013-05-22 DIAGNOSIS — E1165 Type 2 diabetes mellitus with hyperglycemia: Secondary | ICD-10-CM | POA: Diagnosis not present

## 2013-05-22 DIAGNOSIS — E1159 Type 2 diabetes mellitus with other circulatory complications: Secondary | ICD-10-CM | POA: Diagnosis not present

## 2013-05-22 DIAGNOSIS — I10 Essential (primary) hypertension: Secondary | ICD-10-CM | POA: Diagnosis not present

## 2013-05-26 DIAGNOSIS — L02419 Cutaneous abscess of limb, unspecified: Secondary | ICD-10-CM | POA: Diagnosis not present

## 2013-05-26 DIAGNOSIS — E1129 Type 2 diabetes mellitus with other diabetic kidney complication: Secondary | ICD-10-CM | POA: Diagnosis not present

## 2013-05-26 DIAGNOSIS — I798 Other disorders of arteries, arterioles and capillaries in diseases classified elsewhere: Secondary | ICD-10-CM | POA: Diagnosis not present

## 2013-05-26 DIAGNOSIS — E1159 Type 2 diabetes mellitus with other circulatory complications: Secondary | ICD-10-CM | POA: Diagnosis not present

## 2013-05-26 DIAGNOSIS — I10 Essential (primary) hypertension: Secondary | ICD-10-CM | POA: Diagnosis not present

## 2013-05-26 DIAGNOSIS — N189 Chronic kidney disease, unspecified: Secondary | ICD-10-CM | POA: Diagnosis not present

## 2013-05-28 DIAGNOSIS — N189 Chronic kidney disease, unspecified: Secondary | ICD-10-CM | POA: Diagnosis not present

## 2013-05-28 DIAGNOSIS — I10 Essential (primary) hypertension: Secondary | ICD-10-CM | POA: Diagnosis not present

## 2013-05-28 DIAGNOSIS — E1129 Type 2 diabetes mellitus with other diabetic kidney complication: Secondary | ICD-10-CM | POA: Diagnosis not present

## 2013-05-28 DIAGNOSIS — I798 Other disorders of arteries, arterioles and capillaries in diseases classified elsewhere: Secondary | ICD-10-CM | POA: Diagnosis not present

## 2013-05-28 DIAGNOSIS — E1159 Type 2 diabetes mellitus with other circulatory complications: Secondary | ICD-10-CM | POA: Diagnosis not present

## 2013-05-28 DIAGNOSIS — L03119 Cellulitis of unspecified part of limb: Secondary | ICD-10-CM | POA: Diagnosis not present

## 2013-05-30 DIAGNOSIS — I798 Other disorders of arteries, arterioles and capillaries in diseases classified elsewhere: Secondary | ICD-10-CM | POA: Diagnosis not present

## 2013-05-30 DIAGNOSIS — E1165 Type 2 diabetes mellitus with hyperglycemia: Secondary | ICD-10-CM | POA: Diagnosis not present

## 2013-05-30 DIAGNOSIS — N189 Chronic kidney disease, unspecified: Secondary | ICD-10-CM | POA: Diagnosis not present

## 2013-05-30 DIAGNOSIS — L02419 Cutaneous abscess of limb, unspecified: Secondary | ICD-10-CM | POA: Diagnosis not present

## 2013-05-30 DIAGNOSIS — I10 Essential (primary) hypertension: Secondary | ICD-10-CM | POA: Diagnosis not present

## 2013-05-30 DIAGNOSIS — E1159 Type 2 diabetes mellitus with other circulatory complications: Secondary | ICD-10-CM | POA: Diagnosis not present

## 2013-06-02 DIAGNOSIS — L03119 Cellulitis of unspecified part of limb: Secondary | ICD-10-CM | POA: Diagnosis not present

## 2013-06-02 DIAGNOSIS — I798 Other disorders of arteries, arterioles and capillaries in diseases classified elsewhere: Secondary | ICD-10-CM | POA: Diagnosis not present

## 2013-06-02 DIAGNOSIS — E1165 Type 2 diabetes mellitus with hyperglycemia: Secondary | ICD-10-CM | POA: Diagnosis not present

## 2013-06-02 DIAGNOSIS — N189 Chronic kidney disease, unspecified: Secondary | ICD-10-CM | POA: Diagnosis not present

## 2013-06-02 DIAGNOSIS — I10 Essential (primary) hypertension: Secondary | ICD-10-CM | POA: Diagnosis not present

## 2013-06-02 DIAGNOSIS — L02419 Cutaneous abscess of limb, unspecified: Secondary | ICD-10-CM | POA: Diagnosis not present

## 2013-06-02 DIAGNOSIS — E1159 Type 2 diabetes mellitus with other circulatory complications: Secondary | ICD-10-CM | POA: Diagnosis not present

## 2013-06-04 DIAGNOSIS — L02419 Cutaneous abscess of limb, unspecified: Secondary | ICD-10-CM | POA: Diagnosis not present

## 2013-06-04 DIAGNOSIS — E1129 Type 2 diabetes mellitus with other diabetic kidney complication: Secondary | ICD-10-CM | POA: Diagnosis not present

## 2013-06-04 DIAGNOSIS — N189 Chronic kidney disease, unspecified: Secondary | ICD-10-CM | POA: Diagnosis not present

## 2013-06-04 DIAGNOSIS — I10 Essential (primary) hypertension: Secondary | ICD-10-CM | POA: Diagnosis not present

## 2013-06-04 DIAGNOSIS — E1159 Type 2 diabetes mellitus with other circulatory complications: Secondary | ICD-10-CM | POA: Diagnosis not present

## 2013-06-04 DIAGNOSIS — I798 Other disorders of arteries, arterioles and capillaries in diseases classified elsewhere: Secondary | ICD-10-CM | POA: Diagnosis not present

## 2013-06-06 DIAGNOSIS — N189 Chronic kidney disease, unspecified: Secondary | ICD-10-CM | POA: Diagnosis not present

## 2013-06-06 DIAGNOSIS — E1165 Type 2 diabetes mellitus with hyperglycemia: Secondary | ICD-10-CM | POA: Diagnosis not present

## 2013-06-06 DIAGNOSIS — I798 Other disorders of arteries, arterioles and capillaries in diseases classified elsewhere: Secondary | ICD-10-CM | POA: Diagnosis not present

## 2013-06-06 DIAGNOSIS — L02419 Cutaneous abscess of limb, unspecified: Secondary | ICD-10-CM | POA: Diagnosis not present

## 2013-06-06 DIAGNOSIS — I10 Essential (primary) hypertension: Secondary | ICD-10-CM | POA: Diagnosis not present

## 2013-06-06 DIAGNOSIS — E1159 Type 2 diabetes mellitus with other circulatory complications: Secondary | ICD-10-CM | POA: Diagnosis not present

## 2013-06-09 DIAGNOSIS — N189 Chronic kidney disease, unspecified: Secondary | ICD-10-CM | POA: Diagnosis not present

## 2013-06-09 DIAGNOSIS — E1159 Type 2 diabetes mellitus with other circulatory complications: Secondary | ICD-10-CM | POA: Diagnosis not present

## 2013-06-09 DIAGNOSIS — L02419 Cutaneous abscess of limb, unspecified: Secondary | ICD-10-CM | POA: Diagnosis not present

## 2013-06-09 DIAGNOSIS — I798 Other disorders of arteries, arterioles and capillaries in diseases classified elsewhere: Secondary | ICD-10-CM | POA: Diagnosis not present

## 2013-06-09 DIAGNOSIS — E1165 Type 2 diabetes mellitus with hyperglycemia: Secondary | ICD-10-CM | POA: Diagnosis not present

## 2013-06-09 DIAGNOSIS — I10 Essential (primary) hypertension: Secondary | ICD-10-CM | POA: Diagnosis not present

## 2013-06-10 DIAGNOSIS — E1129 Type 2 diabetes mellitus with other diabetic kidney complication: Secondary | ICD-10-CM | POA: Diagnosis not present

## 2013-06-10 DIAGNOSIS — L03119 Cellulitis of unspecified part of limb: Secondary | ICD-10-CM | POA: Diagnosis not present

## 2013-06-10 DIAGNOSIS — I798 Other disorders of arteries, arterioles and capillaries in diseases classified elsewhere: Secondary | ICD-10-CM | POA: Diagnosis not present

## 2013-06-10 DIAGNOSIS — I10 Essential (primary) hypertension: Secondary | ICD-10-CM | POA: Diagnosis not present

## 2013-06-10 DIAGNOSIS — N189 Chronic kidney disease, unspecified: Secondary | ICD-10-CM | POA: Diagnosis not present

## 2013-06-10 DIAGNOSIS — E1159 Type 2 diabetes mellitus with other circulatory complications: Secondary | ICD-10-CM | POA: Diagnosis not present

## 2013-06-11 DIAGNOSIS — I798 Other disorders of arteries, arterioles and capillaries in diseases classified elsewhere: Secondary | ICD-10-CM | POA: Diagnosis not present

## 2013-06-11 DIAGNOSIS — E1159 Type 2 diabetes mellitus with other circulatory complications: Secondary | ICD-10-CM | POA: Diagnosis not present

## 2013-06-11 DIAGNOSIS — N189 Chronic kidney disease, unspecified: Secondary | ICD-10-CM | POA: Diagnosis not present

## 2013-06-11 DIAGNOSIS — L02419 Cutaneous abscess of limb, unspecified: Secondary | ICD-10-CM | POA: Diagnosis not present

## 2013-06-11 DIAGNOSIS — E1165 Type 2 diabetes mellitus with hyperglycemia: Secondary | ICD-10-CM | POA: Diagnosis not present

## 2013-06-11 DIAGNOSIS — I10 Essential (primary) hypertension: Secondary | ICD-10-CM | POA: Diagnosis not present

## 2013-06-12 DIAGNOSIS — N189 Chronic kidney disease, unspecified: Secondary | ICD-10-CM | POA: Diagnosis not present

## 2013-06-12 DIAGNOSIS — E1159 Type 2 diabetes mellitus with other circulatory complications: Secondary | ICD-10-CM | POA: Diagnosis not present

## 2013-06-12 DIAGNOSIS — L02419 Cutaneous abscess of limb, unspecified: Secondary | ICD-10-CM | POA: Diagnosis not present

## 2013-06-12 DIAGNOSIS — I10 Essential (primary) hypertension: Secondary | ICD-10-CM | POA: Diagnosis not present

## 2013-06-12 DIAGNOSIS — E1165 Type 2 diabetes mellitus with hyperglycemia: Secondary | ICD-10-CM | POA: Diagnosis not present

## 2013-06-12 DIAGNOSIS — I798 Other disorders of arteries, arterioles and capillaries in diseases classified elsewhere: Secondary | ICD-10-CM | POA: Diagnosis not present

## 2013-06-13 DIAGNOSIS — E1129 Type 2 diabetes mellitus with other diabetic kidney complication: Secondary | ICD-10-CM | POA: Diagnosis not present

## 2013-06-13 DIAGNOSIS — N189 Chronic kidney disease, unspecified: Secondary | ICD-10-CM | POA: Diagnosis not present

## 2013-06-13 DIAGNOSIS — L02419 Cutaneous abscess of limb, unspecified: Secondary | ICD-10-CM | POA: Diagnosis not present

## 2013-06-13 DIAGNOSIS — I10 Essential (primary) hypertension: Secondary | ICD-10-CM | POA: Diagnosis not present

## 2013-06-13 DIAGNOSIS — I798 Other disorders of arteries, arterioles and capillaries in diseases classified elsewhere: Secondary | ICD-10-CM | POA: Diagnosis not present

## 2013-06-13 DIAGNOSIS — E1159 Type 2 diabetes mellitus with other circulatory complications: Secondary | ICD-10-CM | POA: Diagnosis not present

## 2013-06-14 ENCOUNTER — Encounter: Payer: Self-pay | Admitting: Adult Health

## 2013-06-14 NOTE — Progress Notes (Signed)
  Subjective:    Patient ID: Jared Hall, male    DOB: 08-09-1931, 79 y.o.   MRN: [id number]  HPI This is an 79 years old male who had just finished antibiotic treatment for left leg cellulitis. Note that left leg to be still erythematous and warm to touch.   Review of Systems  Constitutional: Negative.   Cardiovascular: Positive for leg swelling. Negative for chest pain.  Gastrointestinal: Negative for abdominal pain and abdominal distention.  Endocrine: Negative.   Genitourinary: Negative.   Neurological: Negative.   Psychiatric/Behavioral: Negative.        Objective:   Physical Exam  Nursing note and vitals reviewed. Constitutional: He is oriented to person, place, and time. He appears well-developed and well-nourished.  HENT:  Head: Normocephalic and atraumatic.  Right Ear: External ear normal.  Nose: Nose normal.  Mouth/Throat: Oropharynx is clear and moist.  Cardiovascular: Normal rate, regular rhythm and normal heart sounds.   Pulmonary/Chest: Effort normal.  Abdominal: He exhibits no distension.  Musculoskeletal: Normal range of motion. He exhibits edema. He exhibits no tenderness.  Left lower extremity edema, 2+, erythematous  Neurological: He is alert and oriented to person, place, and time.  Skin: Skin is warm and dry. There is erythema.  Psychiatric: He has a normal mood and affect. His behavior is normal. Judgment and thought content normal.     LABS/PROCEDURES: 04/15/13 TSH 1.869 T4 total 8.3 T3 uptake 40.4 free thyroxine index 3.4 WBC 7.2 hemoglobin 12.5 hematocrit 38.7 sodium 142 potassium 4.0 glucose 86 BUN 18 creatinine 1.21 calcium 9.6     Medications reviewed.     Assessment & Plan:   Cellulitis - continue Vibramycin 50 mg by mouth twice a day x1 week

## 2013-06-15 ENCOUNTER — Encounter: Payer: Self-pay | Admitting: Adult Health

## 2013-06-15 DIAGNOSIS — K219 Gastro-esophageal reflux disease without esophagitis: Secondary | ICD-10-CM | POA: Insufficient documentation

## 2013-06-15 DIAGNOSIS — K5900 Constipation, unspecified: Secondary | ICD-10-CM | POA: Insufficient documentation

## 2013-06-15 DIAGNOSIS — E1140 Type 2 diabetes mellitus with diabetic neuropathy, unspecified: Secondary | ICD-10-CM | POA: Insufficient documentation

## 2013-06-15 DIAGNOSIS — F329 Major depressive disorder, single episode, unspecified: Secondary | ICD-10-CM | POA: Insufficient documentation

## 2013-06-15 NOTE — Progress Notes (Signed)
  Subjective:    Patient ID: Jared Hall, male    DOB: 03/04/1931, 79 y.o.   MRN: [id number]  HPI This is an 79 year old male who is for discharge home with home health PT, nursing and CNA. He has been admitted to [facility] place on 04/08/13 from [facility] discharge diagnoses of cellulitis of lower extremity, right hip pain, diabetes mellitus, type II, hyper cholesterolemia and hypertension. Patient has completed SNF rehabilitation and therapy has cleared the patient for discharge.  Review of Systems  Constitutional: Negative.   HENT: Negative.   Eyes: Negative.   Respiratory: Negative for cough and shortness of breath.   Cardiovascular: Positive for leg swelling.  Gastrointestinal: Negative for abdominal pain and abdominal distention.  Endocrine: Negative.   Genitourinary: Negative.   Skin: Negative.   Neurological: Negative.   Hematological: Negative for adenopathy. Does not bruise/bleed easily.  Psychiatric/Behavioral: Negative.        Objective:   Physical Exam  Nursing note and vitals reviewed. Constitutional: He is oriented to person, place, and time. He appears well-developed and well-nourished.  HENT:  Head: Normocephalic and atraumatic.  Right Ear: External ear normal.  Left Ear: External ear normal.  Nose: Nose normal.  Mouth/Throat: Oropharynx is clear and moist.  Eyes: Conjunctivae and EOM are normal. Pupils are equal, round, and reactive to light.  Neck: Normal range of motion. Neck supple.  Cardiovascular: Normal rate, regular rhythm and normal heart sounds.   Pulmonary/Chest: Effort normal and breath sounds normal.  Abdominal: Soft. Bowel sounds are normal.  Musculoskeletal: Normal range of motion. He exhibits edema. He exhibits no tenderness.  BLE edema, 1+  Neurological: He is alert and oriented to person, place, and time.  Skin: Skin is warm and dry.  Psychiatric: He has a normal mood and affect. His behavior is normal. Judgment and thought content  normal.     LABS/PROCEDURES: 04/15/13 TSH 1.869 T4 total 8.3 T3 uptake 40.4 free thyroxine index 3.4 WBC 7.2 hemoglobin 12.5 hematocrit 38.7 sodium 142 potassium 4.0 glucose 86 BUN 18 creatinine 1.21 calcium 9.6     Medications reviewed.     Assessment & Plan:   Depression -stable   GERD (gastroesophageal reflux disease) - Stable   Constipation - stable  Diabetic neuropathy - Well controlled  Essential hypertension, benign - Well controlled  Cellulitis of leg - Resolved  Right hip pain - Well controlled  Dyslipidemia - Stable  DM (diabetes mellitus) - Well controlled     Total discharge time:  <30 minutes Discharge time involved coordination of the discharge process with social worker, nursing staff and therapy department. Medical justification for home health services verified.    CPT CODE: 991 5

## 2013-06-16 DIAGNOSIS — E1159 Type 2 diabetes mellitus with other circulatory complications: Secondary | ICD-10-CM | POA: Diagnosis not present

## 2013-06-16 DIAGNOSIS — I798 Other disorders of arteries, arterioles and capillaries in diseases classified elsewhere: Secondary | ICD-10-CM | POA: Diagnosis not present

## 2013-06-16 DIAGNOSIS — L03119 Cellulitis of unspecified part of limb: Secondary | ICD-10-CM | POA: Diagnosis not present

## 2013-06-16 DIAGNOSIS — E1165 Type 2 diabetes mellitus with hyperglycemia: Secondary | ICD-10-CM | POA: Diagnosis not present

## 2013-06-16 DIAGNOSIS — I10 Essential (primary) hypertension: Secondary | ICD-10-CM | POA: Diagnosis not present

## 2013-06-16 DIAGNOSIS — N189 Chronic kidney disease, unspecified: Secondary | ICD-10-CM | POA: Diagnosis not present

## 2013-06-17 DIAGNOSIS — E1165 Type 2 diabetes mellitus with hyperglycemia: Secondary | ICD-10-CM | POA: Diagnosis not present

## 2013-06-17 DIAGNOSIS — I798 Other disorders of arteries, arterioles and capillaries in diseases classified elsewhere: Secondary | ICD-10-CM | POA: Diagnosis not present

## 2013-06-17 DIAGNOSIS — L03119 Cellulitis of unspecified part of limb: Secondary | ICD-10-CM | POA: Diagnosis not present

## 2013-06-17 DIAGNOSIS — N189 Chronic kidney disease, unspecified: Secondary | ICD-10-CM | POA: Diagnosis not present

## 2013-06-17 DIAGNOSIS — E1159 Type 2 diabetes mellitus with other circulatory complications: Secondary | ICD-10-CM | POA: Diagnosis not present

## 2013-06-17 DIAGNOSIS — I10 Essential (primary) hypertension: Secondary | ICD-10-CM | POA: Diagnosis not present

## 2013-06-18 DIAGNOSIS — E1159 Type 2 diabetes mellitus with other circulatory complications: Secondary | ICD-10-CM | POA: Diagnosis not present

## 2013-06-18 DIAGNOSIS — I10 Essential (primary) hypertension: Secondary | ICD-10-CM | POA: Diagnosis not present

## 2013-06-18 DIAGNOSIS — I798 Other disorders of arteries, arterioles and capillaries in diseases classified elsewhere: Secondary | ICD-10-CM | POA: Diagnosis not present

## 2013-06-18 DIAGNOSIS — E1129 Type 2 diabetes mellitus with other diabetic kidney complication: Secondary | ICD-10-CM | POA: Diagnosis not present

## 2013-06-18 DIAGNOSIS — L03119 Cellulitis of unspecified part of limb: Secondary | ICD-10-CM | POA: Diagnosis not present

## 2013-06-18 DIAGNOSIS — N189 Chronic kidney disease, unspecified: Secondary | ICD-10-CM | POA: Diagnosis not present

## 2013-06-20 ENCOUNTER — Other Ambulatory Visit: Payer: Self-pay | Admitting: Adult Health

## 2013-06-20 DIAGNOSIS — N189 Chronic kidney disease, unspecified: Secondary | ICD-10-CM | POA: Diagnosis not present

## 2013-06-20 DIAGNOSIS — I10 Essential (primary) hypertension: Secondary | ICD-10-CM | POA: Diagnosis not present

## 2013-06-20 DIAGNOSIS — E1159 Type 2 diabetes mellitus with other circulatory complications: Secondary | ICD-10-CM | POA: Diagnosis not present

## 2013-06-20 DIAGNOSIS — E1129 Type 2 diabetes mellitus with other diabetic kidney complication: Secondary | ICD-10-CM | POA: Diagnosis not present

## 2013-06-20 DIAGNOSIS — I798 Other disorders of arteries, arterioles and capillaries in diseases classified elsewhere: Secondary | ICD-10-CM | POA: Diagnosis not present

## 2013-06-20 DIAGNOSIS — L02419 Cutaneous abscess of limb, unspecified: Secondary | ICD-10-CM | POA: Diagnosis not present

## 2013-06-23 DIAGNOSIS — E1159 Type 2 diabetes mellitus with other circulatory complications: Secondary | ICD-10-CM | POA: Diagnosis not present

## 2013-06-23 DIAGNOSIS — N189 Chronic kidney disease, unspecified: Secondary | ICD-10-CM | POA: Diagnosis not present

## 2013-06-23 DIAGNOSIS — E1129 Type 2 diabetes mellitus with other diabetic kidney complication: Secondary | ICD-10-CM | POA: Diagnosis not present

## 2013-06-23 DIAGNOSIS — I10 Essential (primary) hypertension: Secondary | ICD-10-CM | POA: Diagnosis not present

## 2013-06-23 DIAGNOSIS — I798 Other disorders of arteries, arterioles and capillaries in diseases classified elsewhere: Secondary | ICD-10-CM | POA: Diagnosis not present

## 2013-06-23 DIAGNOSIS — L02419 Cutaneous abscess of limb, unspecified: Secondary | ICD-10-CM | POA: Diagnosis not present

## 2013-06-25 DIAGNOSIS — E1165 Type 2 diabetes mellitus with hyperglycemia: Secondary | ICD-10-CM | POA: Diagnosis not present

## 2013-06-25 DIAGNOSIS — L03119 Cellulitis of unspecified part of limb: Secondary | ICD-10-CM | POA: Diagnosis not present

## 2013-06-25 DIAGNOSIS — N189 Chronic kidney disease, unspecified: Secondary | ICD-10-CM | POA: Diagnosis not present

## 2013-06-25 DIAGNOSIS — I10 Essential (primary) hypertension: Secondary | ICD-10-CM | POA: Diagnosis not present

## 2013-06-25 DIAGNOSIS — E1159 Type 2 diabetes mellitus with other circulatory complications: Secondary | ICD-10-CM | POA: Diagnosis not present

## 2013-06-25 DIAGNOSIS — I798 Other disorders of arteries, arterioles and capillaries in diseases classified elsewhere: Secondary | ICD-10-CM | POA: Diagnosis not present

## 2013-06-30 ENCOUNTER — Other Ambulatory Visit: Payer: Self-pay | Admitting: Adult Health

## 2013-07-01 DIAGNOSIS — E1129 Type 2 diabetes mellitus with other diabetic kidney complication: Secondary | ICD-10-CM | POA: Diagnosis not present

## 2013-07-01 DIAGNOSIS — L03119 Cellulitis of unspecified part of limb: Secondary | ICD-10-CM | POA: Diagnosis not present

## 2013-07-01 DIAGNOSIS — N189 Chronic kidney disease, unspecified: Secondary | ICD-10-CM | POA: Diagnosis not present

## 2013-07-01 DIAGNOSIS — I10 Essential (primary) hypertension: Secondary | ICD-10-CM | POA: Diagnosis not present

## 2013-07-01 DIAGNOSIS — E1159 Type 2 diabetes mellitus with other circulatory complications: Secondary | ICD-10-CM | POA: Diagnosis not present

## 2013-07-01 DIAGNOSIS — I798 Other disorders of arteries, arterioles and capillaries in diseases classified elsewhere: Secondary | ICD-10-CM | POA: Diagnosis not present

## 2013-07-08 DIAGNOSIS — E1159 Type 2 diabetes mellitus with other circulatory complications: Secondary | ICD-10-CM | POA: Diagnosis not present

## 2013-07-08 DIAGNOSIS — L02419 Cutaneous abscess of limb, unspecified: Secondary | ICD-10-CM | POA: Diagnosis not present

## 2013-07-08 DIAGNOSIS — E1129 Type 2 diabetes mellitus with other diabetic kidney complication: Secondary | ICD-10-CM | POA: Diagnosis not present

## 2013-07-08 DIAGNOSIS — I10 Essential (primary) hypertension: Secondary | ICD-10-CM | POA: Diagnosis not present

## 2013-07-08 DIAGNOSIS — I798 Other disorders of arteries, arterioles and capillaries in diseases classified elsewhere: Secondary | ICD-10-CM | POA: Diagnosis not present

## 2013-07-08 DIAGNOSIS — N189 Chronic kidney disease, unspecified: Secondary | ICD-10-CM | POA: Diagnosis not present

## 2013-07-10 DIAGNOSIS — E1149 Type 2 diabetes mellitus with other diabetic neurological complication: Secondary | ICD-10-CM | POA: Diagnosis not present

## 2013-07-10 DIAGNOSIS — I6789 Other cerebrovascular disease: Secondary | ICD-10-CM | POA: Diagnosis not present

## 2013-07-10 DIAGNOSIS — I10 Essential (primary) hypertension: Secondary | ICD-10-CM | POA: Diagnosis not present

## 2013-07-10 DIAGNOSIS — L218 Other seborrheic dermatitis: Secondary | ICD-10-CM | POA: Diagnosis not present

## 2013-07-10 DIAGNOSIS — N289 Disorder of kidney and ureter, unspecified: Secondary | ICD-10-CM | POA: Diagnosis not present

## 2013-07-12 DIAGNOSIS — L03119 Cellulitis of unspecified part of limb: Secondary | ICD-10-CM

## 2013-07-12 DIAGNOSIS — E1129 Type 2 diabetes mellitus with other diabetic kidney complication: Secondary | ICD-10-CM

## 2013-07-12 DIAGNOSIS — Z602 Problems related to living alone: Secondary | ICD-10-CM | POA: Diagnosis not present

## 2013-07-12 DIAGNOSIS — E1165 Type 2 diabetes mellitus with hyperglycemia: Secondary | ICD-10-CM

## 2013-07-12 DIAGNOSIS — L02419 Cutaneous abscess of limb, unspecified: Secondary | ICD-10-CM | POA: Diagnosis not present

## 2013-07-12 DIAGNOSIS — I10 Essential (primary) hypertension: Secondary | ICD-10-CM | POA: Diagnosis not present

## 2013-07-12 DIAGNOSIS — M25559 Pain in unspecified hip: Secondary | ICD-10-CM | POA: Diagnosis not present

## 2013-07-12 DIAGNOSIS — I798 Other disorders of arteries, arterioles and capillaries in diseases classified elsewhere: Secondary | ICD-10-CM | POA: Diagnosis not present

## 2013-07-12 DIAGNOSIS — E1159 Type 2 diabetes mellitus with other circulatory complications: Secondary | ICD-10-CM | POA: Diagnosis not present

## 2013-07-12 DIAGNOSIS — N189 Chronic kidney disease, unspecified: Secondary | ICD-10-CM | POA: Diagnosis not present

## 2013-07-29 DIAGNOSIS — L02419 Cutaneous abscess of limb, unspecified: Secondary | ICD-10-CM | POA: Diagnosis not present

## 2013-07-29 DIAGNOSIS — E1159 Type 2 diabetes mellitus with other circulatory complications: Secondary | ICD-10-CM | POA: Diagnosis not present

## 2013-07-29 DIAGNOSIS — N189 Chronic kidney disease, unspecified: Secondary | ICD-10-CM | POA: Diagnosis not present

## 2013-07-29 DIAGNOSIS — E1129 Type 2 diabetes mellitus with other diabetic kidney complication: Secondary | ICD-10-CM | POA: Diagnosis not present

## 2013-07-29 DIAGNOSIS — I798 Other disorders of arteries, arterioles and capillaries in diseases classified elsewhere: Secondary | ICD-10-CM | POA: Diagnosis not present

## 2013-07-29 DIAGNOSIS — I10 Essential (primary) hypertension: Secondary | ICD-10-CM | POA: Diagnosis not present

## 2013-07-30 DIAGNOSIS — L02419 Cutaneous abscess of limb, unspecified: Secondary | ICD-10-CM | POA: Diagnosis not present

## 2013-07-30 DIAGNOSIS — N189 Chronic kidney disease, unspecified: Secondary | ICD-10-CM | POA: Diagnosis not present

## 2013-07-30 DIAGNOSIS — I10 Essential (primary) hypertension: Secondary | ICD-10-CM | POA: Diagnosis not present

## 2013-07-30 DIAGNOSIS — E1129 Type 2 diabetes mellitus with other diabetic kidney complication: Secondary | ICD-10-CM | POA: Diagnosis not present

## 2013-07-30 DIAGNOSIS — E1159 Type 2 diabetes mellitus with other circulatory complications: Secondary | ICD-10-CM | POA: Diagnosis not present

## 2013-07-30 DIAGNOSIS — I798 Other disorders of arteries, arterioles and capillaries in diseases classified elsewhere: Secondary | ICD-10-CM | POA: Diagnosis not present

## 2013-08-12 DIAGNOSIS — N189 Chronic kidney disease, unspecified: Secondary | ICD-10-CM | POA: Diagnosis not present

## 2013-08-12 DIAGNOSIS — L02419 Cutaneous abscess of limb, unspecified: Secondary | ICD-10-CM | POA: Diagnosis not present

## 2013-08-12 DIAGNOSIS — I798 Other disorders of arteries, arterioles and capillaries in diseases classified elsewhere: Secondary | ICD-10-CM | POA: Diagnosis not present

## 2013-08-12 DIAGNOSIS — E1159 Type 2 diabetes mellitus with other circulatory complications: Secondary | ICD-10-CM | POA: Diagnosis not present

## 2013-08-12 DIAGNOSIS — E1129 Type 2 diabetes mellitus with other diabetic kidney complication: Secondary | ICD-10-CM | POA: Diagnosis not present

## 2013-08-12 DIAGNOSIS — I10 Essential (primary) hypertension: Secondary | ICD-10-CM | POA: Diagnosis not present

## 2013-08-19 ENCOUNTER — Other Ambulatory Visit: Payer: Self-pay | Admitting: Adult Health

## 2013-08-19 DIAGNOSIS — L02419 Cutaneous abscess of limb, unspecified: Secondary | ICD-10-CM | POA: Diagnosis not present

## 2013-08-19 DIAGNOSIS — R300 Dysuria: Secondary | ICD-10-CM | POA: Diagnosis not present

## 2013-08-19 DIAGNOSIS — N419 Inflammatory disease of prostate, unspecified: Secondary | ICD-10-CM | POA: Diagnosis not present

## 2013-08-19 DIAGNOSIS — N189 Chronic kidney disease, unspecified: Secondary | ICD-10-CM | POA: Diagnosis not present

## 2013-08-19 DIAGNOSIS — E1349 Other specified diabetes mellitus with other diabetic neurological complication: Secondary | ICD-10-CM | POA: Diagnosis not present

## 2013-08-19 DIAGNOSIS — E1159 Type 2 diabetes mellitus with other circulatory complications: Secondary | ICD-10-CM | POA: Diagnosis not present

## 2013-08-19 DIAGNOSIS — I6789 Other cerebrovascular disease: Secondary | ICD-10-CM | POA: Diagnosis not present

## 2013-08-19 DIAGNOSIS — E119 Type 2 diabetes mellitus without complications: Secondary | ICD-10-CM | POA: Diagnosis not present

## 2013-08-19 DIAGNOSIS — E1129 Type 2 diabetes mellitus with other diabetic kidney complication: Secondary | ICD-10-CM | POA: Diagnosis not present

## 2013-08-19 DIAGNOSIS — I10 Essential (primary) hypertension: Secondary | ICD-10-CM | POA: Diagnosis not present

## 2013-08-19 DIAGNOSIS — E1149 Type 2 diabetes mellitus with other diabetic neurological complication: Secondary | ICD-10-CM | POA: Diagnosis not present

## 2013-08-19 DIAGNOSIS — I798 Other disorders of arteries, arterioles and capillaries in diseases classified elsewhere: Secondary | ICD-10-CM | POA: Diagnosis not present

## 2013-09-01 DIAGNOSIS — I10 Essential (primary) hypertension: Secondary | ICD-10-CM | POA: Diagnosis not present

## 2013-09-01 DIAGNOSIS — N189 Chronic kidney disease, unspecified: Secondary | ICD-10-CM | POA: Diagnosis not present

## 2013-09-01 DIAGNOSIS — E1159 Type 2 diabetes mellitus with other circulatory complications: Secondary | ICD-10-CM | POA: Diagnosis not present

## 2013-09-01 DIAGNOSIS — L02419 Cutaneous abscess of limb, unspecified: Secondary | ICD-10-CM | POA: Diagnosis not present

## 2013-09-01 DIAGNOSIS — I798 Other disorders of arteries, arterioles and capillaries in diseases classified elsewhere: Secondary | ICD-10-CM | POA: Diagnosis not present

## 2013-09-01 DIAGNOSIS — E1129 Type 2 diabetes mellitus with other diabetic kidney complication: Secondary | ICD-10-CM | POA: Diagnosis not present

## 2013-09-11 ENCOUNTER — Other Ambulatory Visit: Payer: Self-pay | Admitting: Adult Health

## 2013-09-12 ENCOUNTER — Other Ambulatory Visit: Payer: Self-pay | Admitting: Adult Health

## 2013-10-30 DIAGNOSIS — E1349 Other specified diabetes mellitus with other diabetic neurological complication: Secondary | ICD-10-CM | POA: Diagnosis not present

## 2013-10-30 DIAGNOSIS — R300 Dysuria: Secondary | ICD-10-CM | POA: Diagnosis not present

## 2013-10-30 DIAGNOSIS — I6789 Other cerebrovascular disease: Secondary | ICD-10-CM | POA: Diagnosis not present

## 2013-10-30 DIAGNOSIS — I10 Essential (primary) hypertension: Secondary | ICD-10-CM | POA: Diagnosis not present

## 2013-10-30 DIAGNOSIS — E1149 Type 2 diabetes mellitus with other diabetic neurological complication: Secondary | ICD-10-CM | POA: Diagnosis not present

## 2013-10-30 DIAGNOSIS — Z0000 Encounter for general adult medical examination without abnormal findings: Secondary | ICD-10-CM | POA: Diagnosis not present

## 2013-10-30 DIAGNOSIS — Z23 Encounter for immunization: Secondary | ICD-10-CM | POA: Diagnosis not present

## 2013-10-30 DIAGNOSIS — N419 Inflammatory disease of prostate, unspecified: Secondary | ICD-10-CM | POA: Diagnosis not present

## 2013-10-30 DIAGNOSIS — E119 Type 2 diabetes mellitus without complications: Secondary | ICD-10-CM | POA: Diagnosis not present

## 2014-01-09 DIAGNOSIS — E1149 Type 2 diabetes mellitus with other diabetic neurological complication: Secondary | ICD-10-CM | POA: Diagnosis not present

## 2014-01-09 DIAGNOSIS — L03119 Cellulitis of unspecified part of limb: Secondary | ICD-10-CM | POA: Diagnosis not present

## 2014-01-09 DIAGNOSIS — IMO0002 Reserved for concepts with insufficient information to code with codable children: Secondary | ICD-10-CM | POA: Diagnosis not present

## 2014-01-09 DIAGNOSIS — M1710 Unilateral primary osteoarthritis, unspecified knee: Secondary | ICD-10-CM | POA: Diagnosis not present

## 2014-01-09 DIAGNOSIS — R609 Edema, unspecified: Secondary | ICD-10-CM | POA: Diagnosis not present

## 2014-01-09 DIAGNOSIS — L02419 Cutaneous abscess of limb, unspecified: Secondary | ICD-10-CM | POA: Diagnosis not present

## 2014-01-09 DIAGNOSIS — I10 Essential (primary) hypertension: Secondary | ICD-10-CM | POA: Diagnosis not present

## 2014-01-11 ENCOUNTER — Emergency Department (HOSPITAL_COMMUNITY): Payer: Medicare Other

## 2014-01-11 ENCOUNTER — Encounter (HOSPITAL_COMMUNITY): Payer: Self-pay | Admitting: Emergency Medicine

## 2014-01-11 ENCOUNTER — Inpatient Hospital Stay (HOSPITAL_COMMUNITY)
Admission: EM | Admit: 2014-01-11 | Discharge: 2014-01-13 | DRG: 603 | Disposition: A | Discharge status: Home Health | Payer: Medicare Other | Attending: Internal Medicine | Admitting: Internal Medicine

## 2014-01-11 DIAGNOSIS — E1149 Type 2 diabetes mellitus with other diabetic neurological complication: Secondary | ICD-10-CM | POA: Diagnosis present

## 2014-01-11 DIAGNOSIS — L0390 Cellulitis, unspecified: Secondary | ICD-10-CM | POA: Diagnosis present

## 2014-01-11 DIAGNOSIS — E1142 Type 2 diabetes mellitus with diabetic polyneuropathy: Secondary | ICD-10-CM | POA: Diagnosis not present

## 2014-01-11 DIAGNOSIS — L0291 Cutaneous abscess, unspecified: Secondary | ICD-10-CM | POA: Diagnosis present

## 2014-01-11 DIAGNOSIS — Z794 Long term (current) use of insulin: Secondary | ICD-10-CM

## 2014-01-11 DIAGNOSIS — L02419 Cutaneous abscess of limb, unspecified: Secondary | ICD-10-CM | POA: Diagnosis not present

## 2014-01-11 DIAGNOSIS — L03119 Cellulitis of unspecified part of limb: Secondary | ICD-10-CM

## 2014-01-11 DIAGNOSIS — J984 Other disorders of lung: Secondary | ICD-10-CM | POA: Diagnosis not present

## 2014-01-11 DIAGNOSIS — E669 Obesity, unspecified: Secondary | ICD-10-CM | POA: Diagnosis present

## 2014-01-11 DIAGNOSIS — IMO0001 Reserved for inherently not codable concepts without codable children: Secondary | ICD-10-CM | POA: Diagnosis not present

## 2014-01-11 DIAGNOSIS — R609 Edema, unspecified: Secondary | ICD-10-CM

## 2014-01-11 DIAGNOSIS — I69998 Other sequelae following unspecified cerebrovascular disease: Secondary | ICD-10-CM

## 2014-01-11 DIAGNOSIS — R600 Localized edema: Secondary | ICD-10-CM

## 2014-01-11 DIAGNOSIS — Z7982 Long term (current) use of aspirin: Secondary | ICD-10-CM | POA: Diagnosis not present

## 2014-01-11 DIAGNOSIS — K219 Gastro-esophageal reflux disease without esophagitis: Secondary | ICD-10-CM | POA: Diagnosis present

## 2014-01-11 DIAGNOSIS — Z66 Do not resuscitate: Secondary | ICD-10-CM | POA: Diagnosis present

## 2014-01-11 DIAGNOSIS — I872 Venous insufficiency (chronic) (peripheral): Secondary | ICD-10-CM | POA: Diagnosis not present

## 2014-01-11 DIAGNOSIS — I10 Essential (primary) hypertension: Secondary | ICD-10-CM | POA: Diagnosis present

## 2014-01-11 DIAGNOSIS — L03115 Cellulitis of right lower limb: Secondary | ICD-10-CM | POA: Diagnosis present

## 2014-01-11 DIAGNOSIS — Z79899 Other long term (current) drug therapy: Secondary | ICD-10-CM | POA: Diagnosis not present

## 2014-01-11 DIAGNOSIS — I8310 Varicose veins of unspecified lower extremity with inflammation: Secondary | ICD-10-CM | POA: Diagnosis not present

## 2014-01-11 DIAGNOSIS — L03116 Cellulitis of left lower limb: Secondary | ICD-10-CM

## 2014-01-11 DIAGNOSIS — R9431 Abnormal electrocardiogram [ECG] [EKG]: Secondary | ICD-10-CM | POA: Diagnosis not present

## 2014-01-11 HISTORY — DX: Essential (primary) hypertension: I10

## 2014-01-11 HISTORY — DX: Type 2 diabetes mellitus without complications: E11.9

## 2014-01-11 HISTORY — DX: Cerebral infarction, unspecified: I63.9

## 2014-01-11 HISTORY — DX: Malignant neoplasm of prostate: C61

## 2014-01-11 HISTORY — DX: Pure hypercholesterolemia, unspecified: E78.00

## 2014-01-11 LAB — CBC
HEMATOCRIT: 38.9 % — AB (ref 39.0–52.0)
HEMATOCRIT: 36.7 % — AB (ref 39.0–52.0)
Hemoglobin: 12.4 g/dL — ABNORMAL LOW (ref 13.0–17.0)
Hemoglobin: 11.6 g/dL — ABNORMAL LOW (ref 13.0–17.0)
MCH: 22.2 pg — AB (ref 26.0–34.0)
MCH: 21.9 pg — ABNORMAL LOW (ref 26.0–34.0)
MCHC: 31.9 g/dL (ref 30.0–36.0)
MCHC: 31.6 g/dL (ref 30.0–36.0)
MCV: 69.6 fL — AB (ref 78.0–100.0)
MCV: 69.4 fL — ABNORMAL LOW (ref 78.0–100.0)
Platelets: 240 10*3/uL (ref 150–400)
Platelets: 234 10*3/uL (ref 150–400)
RBC: 5.59 MIL/uL (ref 4.22–5.81)
RBC: 5.29 MIL/uL (ref 4.22–5.81)
RDW: 15.2 % (ref 11.5–15.5)
RDW: 15.2 % (ref 11.5–15.5)
WBC: 8.2 10*3/uL (ref 4.0–10.5)
WBC: 8.7 10*3/uL (ref 4.0–10.5)

## 2014-01-11 LAB — GLUCOSE, CAPILLARY
GLUCOSE-CAPILLARY: 209 mg/dL — AB (ref 70–99)
GLUCOSE-CAPILLARY: 249 mg/dL — AB (ref 70–99)

## 2014-01-11 LAB — BASIC METABOLIC PANEL
BUN: 17 mg/dL (ref 6–23)
CHLORIDE: 99 meq/L (ref 96–112)
CO2: 29 mEq/L (ref 19–32)
CREATININE: 1.25 mg/dL (ref 0.50–1.35)
Calcium: 9.2 mg/dL (ref 8.4–10.5)
GFR calc Af Amer: 61 mL/min — ABNORMAL LOW (ref >90)
GFR calc non Af Amer: 53 mL/min — ABNORMAL LOW (ref >90)
Glucose, Bld: 219 mg/dL — ABNORMAL HIGH (ref 70–99)
Potassium: 4.7 mEq/L (ref 3.7–5.3)
Sodium: 139 mEq/L (ref 137–147)

## 2014-01-11 LAB — CREATININE, SERUM
Creatinine, Ser: 1.22 mg/dL (ref 0.50–1.35)
GFR calc Af Amer: 63 mL/min — ABNORMAL LOW (ref >90)
GFR calc non Af Amer: 54 mL/min — ABNORMAL LOW (ref >90)

## 2014-01-11 LAB — PRO B NATRIURETIC PEPTIDE: PRO B NATRI PEPTIDE: 158 pg/mL (ref 0–450)

## 2014-01-11 MED ORDER — SODIUM CHLORIDE 0.9 % IV SOLN: 250.0000 mL | INTRAVENOUS | Status: DC | PRN

## 2014-01-11 MED ORDER — SODIUM CHLORIDE 0.9 % IJ SOLN: 3.0000 mL | INTRAMUSCULAR | Status: DC | PRN

## 2014-01-11 MED ORDER — DEXTROSE 5 % IV SOLN
1.0000 g | INTRAVENOUS | Status: DC
  Administered 2014-01-11 – 2014-01-12 (×2): 1 g via INTRAVENOUS
  Filled 2014-01-11 (×3): qty 10

## 2014-01-11 MED ORDER — ACETAMINOPHEN 650 MG RE SUPP: 650.0000 mg | Freq: Four times a day (QID) | RECTAL | Status: DC | PRN

## 2014-01-11 MED ORDER — CLOPIDOGREL BISULFATE 75 MG PO TABS
75.0000 mg | ORAL_TABLET | Freq: Every day | ORAL | Status: DC
  Administered 2014-01-12 – 2014-01-13 (×2): 75 mg via ORAL
  Filled 2014-01-11 (×2): qty 1

## 2014-01-11 MED ORDER — TERAZOSIN HCL 2 MG PO CAPS
4.0000 mg | ORAL_CAPSULE | Freq: Every day | ORAL | Status: DC
  Administered 2014-01-12 – 2014-01-13 (×2): 4 mg via ORAL
  Filled 2014-01-11 (×2): qty 2

## 2014-01-11 MED ORDER — INSULIN ASPART 100 UNIT/ML ~~LOC~~ SOLN
0.0000 [IU] | Freq: Every day | SUBCUTANEOUS | Status: DC
  Administered 2014-01-11: 2 [IU] via SUBCUTANEOUS

## 2014-01-11 MED ORDER — PREGABALIN 50 MG PO CAPS
50.0000 mg | ORAL_CAPSULE | Freq: Two times a day (BID) | ORAL | Status: DC
  Administered 2014-01-11 – 2014-01-13 (×4): 50 mg via ORAL
  Filled 2014-01-11 (×4): qty 1

## 2014-01-11 MED ORDER — ENOXAPARIN SODIUM 40 MG/0.4ML ~~LOC~~ SOLN
40.0000 mg | SUBCUTANEOUS | Status: DC
  Administered 2014-01-11 – 2014-01-12 (×2): 40 mg via SUBCUTANEOUS
  Filled 2014-01-11 (×3): qty 0.4

## 2014-01-11 MED ORDER — BUPROPION HCL ER (SR) 150 MG PO TB12
150.0000 mg | ORAL_TABLET | Freq: Every day | ORAL | Status: DC
  Administered 2014-01-12 – 2014-01-13 (×2): 150 mg via ORAL
  Filled 2014-01-11 (×2): qty 1

## 2014-01-11 MED ORDER — ZOLPIDEM TARTRATE 5 MG PO TABS: 5.0000 mg | ORAL_TABLET | Freq: Every evening | ORAL | Status: DC | PRN

## 2014-01-11 MED ORDER — DOCUSATE SODIUM 100 MG PO CAPS
100.0000 mg | ORAL_CAPSULE | Freq: Two times a day (BID) | ORAL | Status: DC
  Administered 2014-01-11 – 2014-01-13 (×4): 100 mg via ORAL
  Filled 2014-01-11 (×5): qty 1

## 2014-01-11 MED ORDER — SODIUM CHLORIDE 0.9 % IJ SOLN
3.0000 mL | Freq: Two times a day (BID) | INTRAMUSCULAR | Status: DC
  Administered 2014-01-11 – 2014-01-13 (×4): 3 mL via INTRAVENOUS

## 2014-01-11 MED ORDER — SIMVASTATIN 20 MG PO TABS
20.0000 mg | ORAL_TABLET | Freq: Every day | ORAL | Status: DC
  Administered 2014-01-12 – 2014-01-13 (×2): 20 mg via ORAL
  Filled 2014-01-11 (×2): qty 1

## 2014-01-11 MED ORDER — LEVOFLOXACIN IN D5W 500 MG/100ML IV SOLN
500.0000 mg | INTRAVENOUS | Status: DC
  Filled 2014-01-11: qty 100

## 2014-01-11 MED ORDER — ACETAMINOPHEN 325 MG PO TABS: 650.0000 mg | ORAL_TABLET | Freq: Four times a day (QID) | ORAL | Status: DC | PRN

## 2014-01-11 MED ORDER — POTASSIUM CHLORIDE CRYS ER 20 MEQ PO TBCR
20.0000 meq | EXTENDED_RELEASE_TABLET | Freq: Every day | ORAL | Status: DC
  Administered 2014-01-12 – 2014-01-13 (×2): 20 meq via ORAL
  Filled 2014-01-11 (×2): qty 1

## 2014-01-11 MED ORDER — PANTOPRAZOLE SODIUM 40 MG PO TBEC
40.0000 mg | DELAYED_RELEASE_TABLET | Freq: Every day | ORAL | Status: DC
  Administered 2014-01-12 – 2014-01-13 (×2): 40 mg via ORAL
  Filled 2014-01-11 (×4): qty 1

## 2014-01-11 MED ORDER — ONDANSETRON HCL 4 MG PO TABS: 4.0000 mg | ORAL_TABLET | Freq: Four times a day (QID) | ORAL | Status: DC | PRN

## 2014-01-11 MED ORDER — VANCOMYCIN HCL IN DEXTROSE 1-5 GM/200ML-% IV SOLN
1000.0000 mg | Freq: Two times a day (BID) | INTRAVENOUS | Status: DC
  Administered 2014-01-11 – 2014-01-13 (×4): 1000 mg via INTRAVENOUS
  Filled 2014-01-11 (×5): qty 200

## 2014-01-11 MED ORDER — ONDANSETRON HCL 4 MG/2ML IJ SOLN: 4.0000 mg | Freq: Four times a day (QID) | INTRAMUSCULAR | Status: DC | PRN

## 2014-01-11 MED ORDER — FUROSEMIDE 40 MG PO TABS
40.0000 mg | ORAL_TABLET | Freq: Every day | ORAL | Status: DC
  Administered 2014-01-12 – 2014-01-13 (×2): 40 mg via ORAL
  Filled 2014-01-11 (×2): qty 1

## 2014-01-11 MED ORDER — INSULIN GLARGINE 100 UNIT/ML ~~LOC~~ SOLN
45.0000 [IU] | Freq: Two times a day (BID) | SUBCUTANEOUS | Status: DC
  Administered 2014-01-11 – 2014-01-13 (×4): 45 [IU] via SUBCUTANEOUS
  Filled 2014-01-11 (×5): qty 0.45

## 2014-01-11 MED ORDER — INSULIN ASPART 100 UNIT/ML ~~LOC~~ SOLN
0.0000 [IU] | Freq: Three times a day (TID) | SUBCUTANEOUS | Status: DC
  Administered 2014-01-11: 5 [IU] via SUBCUTANEOUS
  Administered 2014-01-12: 3 [IU] via SUBCUTANEOUS
  Administered 2014-01-12: 5 [IU] via SUBCUTANEOUS
  Administered 2014-01-12 – 2014-01-13 (×3): 3 [IU] via SUBCUTANEOUS

## 2014-01-11 MED ORDER — OXYCODONE HCL 5 MG PO TABS
5.0000 mg | ORAL_TABLET | ORAL | Status: DC | PRN
  Administered 2014-01-11: 5 mg via ORAL
  Filled 2014-01-11: qty 1

## 2014-01-11 MED ORDER — MORPHINE SULFATE 2 MG/ML IJ SOLN: 1.0000 mg | INTRAMUSCULAR | Status: DC | PRN

## 2014-01-11 MED ORDER — ASPIRIN EC 81 MG PO TBEC
81.0000 mg | DELAYED_RELEASE_TABLET | Freq: Every day | ORAL | Status: DC
  Administered 2014-01-12 – 2014-01-13 (×2): 81 mg via ORAL
  Filled 2014-01-11 (×2): qty 1

## 2014-01-11 NOTE — H&P (Signed)
[facility] History and Physical  Jared Hall [id number] DOB: 1931/08/25 DOA: 01/11/2014  Referring physician: Pamella Pert, ER physician PCP: Henrine Screws, MD   Chief Complaint: Leg infection  HPI: Jared Hall is a 80 y.o. male  The past history diabetes, obesity and hypertension plus chronic venous stasis of the lower extremities who in the past few weeks has noted increased swelling and erythema of his lower legs to the point where he started having wounds that burst open. He says primary care physician several days ago who advised the patient to go to the hospital for admission and IV antibiotics. However patient's family was unable to get in on the hospital until today. Patient apparently had some issues to be taken care of prior to coming in. In the interim, he was put on Augmentin. He states that the erythema and cellulitis is no better or no worse since he started by mouth antibiotics. After coming into the emergency room today, his vital signs were stable with a normal white blood cell count. He was noted to have swollen and erythematous lower extremities with some open wounds. Hospitalists were called for further evaluation and admission.   Review of Systems:  Patient seen down emergency room. Doing okay. Denies any headaches, vision changes, dysphagia, chest pain, palpitations, shortness of breath, wheeze, cough, abdominal pain, hematuria, dysuria, constipation, diarrhea. Patient has chronic lower extremity numbness as well as swelling. He seemed to be a bit more tender and swollen and erythematous in the past few weeks. No nausea vomiting. Review systems otherwise negative.  Past Medical History  Diagnosis Date  . Umbilical hernia   . Diabetes mellitus   . Cancer    hypertension Chronic venous stasis  Past Surgical History  Procedure Laterality Date  . Ventral hernia repair  12/18/2011    Procedure: HERNIA REPAIR VENTRAL ADULT;  Surgeon: Adin Hector,  MD;  Location: [facility];  Service: General;  Laterality: N/A;   Social History:  reports that he has never smoked. He has never used smokeless tobacco. He reports that he does not drink alcohol or use illicit drugs. patient lives at home by himself, with family lives nearby. He is normally able to ambulate using a walker.  No Known Allergies  Family history: Diabetes and hypertension  Prior to Admission medications   Medication Sig Start Date End Date Taking? Authorizing Provider  amoxicillin-clavulanate (AUGMENTIN) 875-125 MG per tablet Take 1 tablet by mouth 2 (two) times daily. 01/09/14 01/18/14 Yes Historical Provider, MD  aspirin EC 81 MG tablet Take 81 mg by mouth daily.   Yes Historical Provider, MD  B Complex Vitamins (VITAMIN B COMPLEX PO) Take 1 tablet by mouth daily.   Yes Historical Provider, MD  buPROPion (WELLBUTRIN SR) 150 MG 12 hr tablet Take 150 mg by mouth daily.   Yes Historical Provider, MD  clopidogrel (PLAVIX) 75 MG tablet Take 75 mg by mouth daily.   Yes Historical Provider, MD  Docusate Sodium (DSS) 100 MG CAPS Take 100 mg by mouth 2 (two) times daily. 04/08/13  Yes Josetta Huddle, MD  furosemide (LASIX) 80 MG tablet Take 40 mg by mouth daily.   Yes Historical Provider, MD  HYDROcodone-acetaminophen (NORCO) 10-325 MG per tablet Take 2 tablets by mouth every 6 (six) hours as needed for moderate pain.   Yes Historical Provider, MD  insulin glargine (LANTUS) 100 UNIT/ML injection Inject 60 Units into the skin 2 (two) times daily.   Yes Historical Provider, MD  pantoprazole (  PROTONIX) 40 MG tablet Take 40 mg by mouth daily.   Yes Historical Provider, MD  potassium chloride SA (K-DUR,KLOR-CON) 20 MEQ tablet Take 20 mEq by mouth daily.   Yes Historical Provider, MD  pregabalin (LYRICA) 50 MG capsule Take 50 mg by mouth 2 (two) times daily.   Yes Historical Provider, MD  PRESCRIPTION MEDICATION Take 1 tablet by mouth 2 (two) times daily. 01/09/14 01/18/14 Yes Historical Provider, MD    simvastatin (ZOCOR) 20 MG tablet Take 20 mg by mouth daily.    Yes Historical Provider, MD  terazosin (HYTRIN) 2 MG capsule Take 4 mg by mouth daily.   Yes Historical Provider, MD  vitamin B-12 (CYANOCOBALAMIN) 1000 MCG tablet Take 1,000 mcg by mouth daily.   Yes Historical Provider, MD  zolpidem (AMBIEN) 5 MG tablet Take 5 mg by mouth at bedtime as needed for sleep.   Yes Historical Provider, MD   Physical Exam: Filed Vitals:   01/11/14 1400  BP: 139/71  Pulse: 85  Temp: 98 F (36.7 C)  Resp: 20    BP 139/71  Pulse 85  Temp(Src) 98 F (36.7 C) (Oral)  Resp 20  Ht 5\' 11"  (1.803 m)  Wt 119.75 kg (264 lb)  BMI 36.84 kg/m2  SpO2 97%  General:  Alert and oriented x3, no acute distress, looks younger than stated age HEENT: Normocephalic, atraumatic, extremities are moist, airway is narrow Cardiovascular: Regular rate and rhythm, S1-S2, The lungs: Clear to auscultation bilaterally Abdomen: Soft, obese, nontender, positive bowel sounds Extremities: Patient has chronic venous stasis of the lower extremities with 2+ pitting edema from the knees down. Both legs are significantly erythematous on the anterior aspect with some open wounds measuring approximately 2 inches x2 linear wounds on the left lower extremity below the knee. The erythema and edema extending into the feet always the toes. Poor toenail condition. Evidence of callus formation on the feet. Skin is dry Neuro: No focal deficits, although evidence of peripheral myopathy is present Psych: Patient is appropriate, no evidence of psychoses           Labs on Admission:  Basic Metabolic Panel:  Recent Labs Lab 01/11/14 1031  NA 139  K 4.7  CL 99  CO2 29  GLUCOSE 219*  BUN 17  CREATININE 1.25  CALCIUM 9.2   Liver Function Tests: No results found for this basename: AST, ALT, ALKPHOS, BILITOT, PROT, ALBUMIN,  in the last 168 hours No results found for this basename: LIPASE, AMYLASE,  in the last 168 hours No results  found for this basename: AMMONIA,  in the last 168 hours CBC:  Recent Labs Lab 01/11/14 1031  WBC 8.2  HGB 12.4*  HCT 38.9*  MCV 69.6*  PLT 240   Cardiac Enzymes: No results found for this basename: CKTOTAL, CKMB, CKMBINDEX, TROPONINI,  in the last 168 hours  BNP (last 3 results)  Recent Labs  01/11/14 1031  PROBNP 158.0   CBG: No results found for this basename: GLUCAP,  in the last 168 hours  Radiological Exams on Admission: Dg Chest 2 View  01/11/2014   CLINICAL DATA:  Shortness of breath, hypertension  EXAM: CHEST  2 VIEW  COMPARISON:  04/18/2012  FINDINGS: Stable cardiomegaly with low lung volumes. Streaky bibasilar densities as before compatible with scarring or atelectasis. Negative for CHF.  Left midlung 12 mm nodular density noted projects over the fourth anterior rib shadow. This could represent superimposed shadows. Difficult to exclude underlying nodule by plain radiography. Consider  short-term follow-up versus nonemergent chest CT.  Trachea is midline.  Atherosclerosis of the aorta.  IMPRESSION: cardiomegaly with similar pattern of bibasilar atelectasis versus scarring.  left midlung nodular density versus nodule could represent superimposed shadows versus lung nodule. See above comment.   Electronically Signed   By: Daryll Brod M.D.   On: 01/11/2014 12:10    EKG: Independently reviewed. Sinus rhythm with prolonged PR, no change  Assessment/Plan Active Problems:   Obesity (BMI 30-39.9): Patient meets criteria for obesity with BMI of 36.9    Cellulitis of leg: Will ask wound care nurse to see, check when cultures and start IV Levaquin. Stable, no evidence of sepsis    Essential hypertension, benign: Continue home meds    GERD (gastroesophageal reflux disease): Continue PPI.    Lower extremity edema: Chronic venous stasis with worsening edema secondary to cellulitis. BNP normal.   Venous stasis dermatitis   DM type 2 with diabetic peripheral neuropathy:  Patient on Lantus 60 units twice a day. Decreased to 45 for now and continue sliding scale and monitor. Patient states CBGs are normally well controlled. Patient's PCP will be seeing him tomorrow, so no reason to check A1 C. is that likely has been checked in the office recently.    Code Status: DO NOT RESUSCITATE as indicated per patient.  Family Communication: Son and daughter-in-law at bedside.  Disposition Plan: Once wound care plan decided, patient could potentially go home on IV or by mouth antibiotics plus wound care  Time spent: 35 minutes  [facility] Pager [phone]

## 2014-01-11 NOTE — ED Provider Notes (Signed)
CSN: [id number]     Arrival date & time 01/11/14  1003 History   First MD Initiated Contact with Patient 01/11/14 1049     Chief Complaint  Patient presents with  . Leg Swelling     (Consider location/radiation/quality/duration/timing/severity/associated sxs/prior Treatment) Patient is a 80 y.o. male presenting with leg pain. The history is provided by the patient.  Leg Pain Location:  Leg (bilateral lower legs) Time since incident:  2 weeks Injury: no   Leg location:  L leg and R leg Pain details:    Quality:  Aching   Radiates to:  Does not radiate   Severity:  Mild   Onset quality:  Gradual   Duration:  2 weeks   Timing:  Constant   Progression:  Worsening Chronicity:  Recurrent Dislocation: no   Foreign body present:  No foreign bodies Prior injury to area:  No Relieved by: pain medicine at home. Worsened by:  Nothing tried Ineffective treatments:  None tried Associated symptoms: no fever and no neck pain     Past Medical History  Diagnosis Date  . Umbilical hernia   . Diabetes mellitus   . Cancer    Past Surgical History  Procedure Laterality Date  . Ventral hernia repair  12/18/2011    Procedure: HERNIA REPAIR VENTRAL ADULT;  Surgeon: Adin Hector, MD;  Location: [facility];  Service: General;  Laterality: N/A;   No family history on file. History  Substance Use Topics  . Smoking status: Never Smoker   . Smokeless tobacco: Never Used  . Alcohol Use: No    Review of Systems  Constitutional: Negative for fever.  HENT: Negative for drooling and rhinorrhea.   Eyes: Negative for pain.  Respiratory: Negative for cough and shortness of breath.   Cardiovascular: Negative for chest pain and leg swelling.  Gastrointestinal: Negative for nausea, vomiting, abdominal pain and diarrhea.  Genitourinary: Negative for dysuria and hematuria.  Musculoskeletal: Negative for gait problem and neck pain.  Skin: Negative for color change.  Neurological: Negative for  numbness and headaches.  Hematological: Negative for adenopathy.  Psychiatric/Behavioral: Negative for behavioral problems.  All other systems reviewed and are negative.      Allergies  Review of patient's allergies indicates no known allergies.  Home Medications   Current Outpatient Rx  Name  Route  Sig  Dispense  Refill  . aspirin EC 81 MG tablet   Oral   Take 81 mg by mouth daily.         . B Complex Vitamins (VITAMIN B COMPLEX PO)   Oral   Take 1 tablet by mouth daily.         [name] Kitchen buPROPion (WELLBUTRIN SR) 150 MG 12 hr tablet   Oral   Take 150 mg by mouth daily.         . clopidogrel (PLAVIX) 75 MG tablet   Oral   Take 75 mg by mouth daily.         [name] Kitchen docusate sodium 100 MG CAPS   Oral   Take 100 mg by mouth 2 (two) times daily.   30 capsule   0   . doxycycline (VIBRAMYCIN) 50 MG capsule   Oral   Take 1 capsule (50 mg total) by mouth 2 (two) times daily.   14 capsule   0   . furosemide (LASIX) 80 MG tablet   Oral   Take 40 mg by mouth daily.         [name] Kitchen HYDROcodone-acetaminophen (  NORCO) 10-325 MG per tablet   Oral   Take 1 tablet by mouth every 8 (eight) hours as needed. For pain   90 tablet   0   . insulin glargine (LANTUS) 100 UNIT/ML injection   Subcutaneous   Inject 60 Units into the skin 2 (two) times daily.         . pantoprazole (PROTONIX) 40 MG tablet   Oral   Take 40 mg by mouth daily.         . potassium chloride SA (K-DUR,KLOR-CON) 20 MEQ tablet   Oral   Take 20 mEq by mouth daily.         . primidone (MYSOLINE) 50 MG tablet   Oral   Take 25 mg by mouth 2 (two) times daily.         [name] Kitchen senna (SENOKOT) 8.6 MG TABS   Oral   Take 2 tablets (17.2 mg total) by mouth 2 (two) times daily.   120 each   1   . simvastatin (ZOCOR) 20 MG tablet   Oral   Take 20 mg by mouth every evening.         . terazosin (HYTRIN) 2 MG capsule   Oral   Take 4 mg by mouth daily.         . vitamin B-12 (CYANOCOBALAMIN) 1000 MCG  tablet   Oral   Take 1,000 mcg by mouth daily.         [name] Kitchen zolpidem (AMBIEN) 10 MG tablet   Oral   Take 10 mg by mouth at bedtime as needed. For insomnia          BP 156/59  Pulse 84  Temp(Src) 97.6 F (36.4 C) (Oral)  Resp 22  Ht 5\' 11"  (1.803 m)  Wt 264 lb (119.75 kg)  BMI 36.84 kg/m2  SpO2 93% Physical Exam  Nursing note and vitals reviewed. Constitutional: He is oriented to person, place, and time. He appears well-developed and well-nourished.  HENT:  Head: Normocephalic and atraumatic.  Right Ear: External ear normal.  Left Ear: External ear normal.  Nose: Nose normal.  Mouth/Throat: Oropharynx is clear and moist. No oropharyngeal exudate.  Eyes: Conjunctivae and EOM are normal. Pupils are equal, round, and reactive to light.  Neck: Normal range of motion. Neck supple.  Cardiovascular: Normal rate, regular rhythm, normal heart sounds and intact distal pulses.  Exam reveals no gallop and no friction rub.   No murmur heard. Pulmonary/Chest: Effort normal and breath sounds normal. No respiratory distress. He has no wheezes.  Abdominal: Soft. Bowel sounds are normal. He exhibits no distension. There is no tenderness. There is no rebound and no guarding.  Musculoskeletal: Normal range of motion. He exhibits edema (moderate in bilateral LE's). He exhibits no tenderness.  Neurological: He is alert and oriented to person, place, and time.  Skin: Skin is warm and dry.  Mild erythema extending from bilateral feet to the mid shin of both legs. Mild weeping and several superficial abrasions are noted on bilateral shins.  Psychiatric: He has a normal mood and affect. His behavior is normal.    ED Course  Procedures (including critical care time) Labs Review Labs Reviewed  CBC - Abnormal; Notable for the following:    Hemoglobin 12.4 (*)    HCT 38.9 (*)    MCV 69.6 (*)    MCH 22.2 (*)    All other components within normal limits  BASIC METABOLIC PANEL - Abnormal; Notable  for the following:  Glucose, Bld 219 (*)    GFR calc non Af Amer 53 (*)    GFR calc Af Amer 61 (*)    All other components within normal limits  CBC - Abnormal; Notable for the following:    Hemoglobin 11.6 (*)    HCT 36.7 (*)    MCV 69.4 (*)    MCH 21.9 (*)    All other components within normal limits  CREATININE, SERUM - Abnormal; Notable for the following:    GFR calc non Af Amer 54 (*)    GFR calc Af Amer 63 (*)    All other components within normal limits  GLUCOSE, CAPILLARY - Abnormal; Notable for the following:    Glucose-Capillary 209 (*)    All other components within normal limits  BASIC METABOLIC PANEL - Abnormal; Notable for the following:    Glucose, Bld 164 (*)    GFR calc non Af Amer 57 (*)    GFR calc Af Amer 66 (*)    All other components within normal limits  CBC - Abnormal; Notable for the following:    WBC 10.7 (*)    Hemoglobin 11.9 (*)    HCT 37.7 (*)    MCV 68.5 (*)    MCH 21.6 (*)    All other components within normal limits  GLUCOSE, CAPILLARY - Abnormal; Notable for the following:    Glucose-Capillary 249 (*)    All other components within normal limits  GLUCOSE, CAPILLARY - Abnormal; Notable for the following:    Glucose-Capillary 173 (*)    All other components within normal limits  GLUCOSE, CAPILLARY - Abnormal; Notable for the following:    Glucose-Capillary 220 (*)    All other components within normal limits  WOUND CULTURE  PRO B NATRIURETIC PEPTIDE   Imaging Review Dg Chest 2 View  01/11/2014   CLINICAL DATA:  Shortness of breath, hypertension  EXAM: CHEST  2 VIEW  COMPARISON:  04/18/2012  FINDINGS: Stable cardiomegaly with low lung volumes. Streaky bibasilar densities as before compatible with scarring or atelectasis. Negative for CHF.  Left midlung 12 mm nodular density noted projects over the fourth anterior rib shadow. This could represent superimposed shadows. Difficult to exclude underlying nodule by plain radiography. Consider  short-term follow-up versus nonemergent chest CT.  Trachea is midline.  Atherosclerosis of the aorta.  IMPRESSION: cardiomegaly with similar pattern of bibasilar atelectasis versus scarring.  left midlung nodular density versus nodule could represent superimposed shadows versus lung nodule. See above comment.   Electronically Signed   By: Daryll Brod M.D.   On: 01/11/2014 12:10     Date: 01/12/2014  Rate: 85  Rhythm: normal sinus rhythm  QRS Axis: normal  Intervals: PR prolonged  ST/T Wave abnormalities: normal  Conduction Disutrbances:none  Narrative Interpretation: No ST or T wave changes consistent with ischemia.    Old EKG Reviewed: unchanged       MDM   Final diagnoses:  Cellulitis  Venous stasis dermatitis  Lower extremity edema    11:09 AM 80 y.o. male with a history of diabetes and chronic lower extremity swelling who presents with erythema and pain of his distal lower extremities. He states that his legs began swelling approximately 1.5 weeks ago. It became erythematous, mildly painful, and began developing blisters and weeping. He was seen by his primary care provider 2 days ago. The family reports that his PCP wanted him admitted to the hospital. The patient's family was unable to bring him to the hospital  until today and they report he was started on oral antibiotics until he could come and be evaluated. The patient denies any fevers, shortness of breath, diarrhea, cough. He is afebrile and vital signs are unremarkable here. Will get screening labwork. Pt denies hx of DVT's.   The patient will [facility]. Given stability of pt will allow hospitalist to see pt and decide what type of abx needed.    Blanchard Kelch, MD 01/12/14 [phone]

## 2014-01-11 NOTE — ED Notes (Signed)
Son [name] [phone], Daughter in [place] [phone].[name] KitchenMarland Kitchen

## 2014-01-11 NOTE — Consult Note (Signed)
WOC wound consult note Reason for Consult:Bilateral venous insufficiency with apparent cellulitis. Wound type:Venous insufficiency Pressure Ulcer POA: No Measurement:Bilateral erythema and edema with pretibular ulcerations forming where edema has been weeping onto tissue.  L>R.  Left affected area measures 10cm x 7cm and while there is discoloration, there arte no open wounds in that space.  The right affected areas measures 3cm x 4cm with three partial thickness wounds in the area, the largest measuring 2cm x 1cm x 0.2cm Wound ONG:EXBMW, pink, moist Drainage (amount, consistency, odor) No odor from the serous fluid on tissue or on the bedlinens. Periwound:erythematous, edematous with warmth bilaterally.  Anterior ulcerations that patient reports "burn" when weeping fluid  touches tissue. Dressing procedure/placement/frequency: I do not feel it appropriate to compress with Unnas' Boot or other multilayer compression dressing at this time as they are left in place for several days and it is important to monitor the resolution or at least the improvement in the erythema, etc. While on systemic antibiotics.  Instead I will provide orders for topical management using xeroform gauze as a wound contact layer and topping with an absorbant dressing secured with Kerlix.  ACE wraps (13-17 mmHg) are used for mild compression in conjunction with elevation and floating of the heels to redistribute pressure and prevent pressure ulcerations. Patient has had [place] [facility] for post acute wrapping of his LEs with Unnas' Boots in the past; when appropriate, I think this is a reasonable, efficacious intervention.  Patient was reminded that his [facility] or internist perhaps had mentioned the need for compression hosiery, but patient does not think that "those are easy to get or easy to wear".  If you agree after inflammatory process is abating, please order [facility] and or follow up in the outpatient wound care clinic of the patient's  choice. [facility] nursing team will not follow, but will remain available to this patient, the nursing and medical team.  Please re-consult if needed. Thanks, Maudie Flakes, MSN, RN, [place], [place], [place] [phone])

## 2014-01-11 NOTE — Progress Notes (Signed)
ANTIBIOTIC CONSULT NOTE - INITIAL  Pharmacy Consult for vancomycin Indication: LE cellulitis  No Known Allergies  Patient Measurements: Height: 5\' 11"  (180.3 cm) Weight: 264 lb (119.75 kg) IBW/kg (Calculated) : 75.3  Vital Signs: Temp: 98 F (36.7 C) (02/22 1400) Temp src: Oral (02/22 1015) BP: 139/71 mmHg (02/22 1400) Pulse Rate: 85 (02/22 1400) Intake/Output from previous day:   Intake/Output from this shift:    Labs:  Recent Labs  01/11/14 1031  WBC 8.2  HGB 12.4*  PLT 240  CREATININE 1.25   Estimated Creatinine Clearance: 62.1 ml/min (by C-G formula based on Cr of 1.25). No results found for this basename: VANCOTROUGH, VANCOPEAK, VANCORANDOM, GENTTROUGH, GENTPEAK, GENTRANDOM, TOBRATROUGH, TOBRAPEAK, TOBRARND, AMIKACINPEAK, AMIKACINTROU, AMIKACIN,  in the last 72 hours   Microbiology: No results found for this or any previous visit (from the past 720 hour(s)).  Medical History: Past Medical History  Diagnosis Date  . Umbilical hernia   . Diabetes mellitus   . Cancer     Medications:  Prescriptions prior to admission  Medication Sig Dispense Refill  . amoxicillin-clavulanate (AUGMENTIN) 875-125 MG per tablet Take 1 tablet by mouth 2 (two) times daily.      [name] Kitchen aspirin EC 81 MG tablet Take 81 mg by mouth daily.      . B Complex Vitamins (VITAMIN B COMPLEX PO) Take 1 tablet by mouth daily.      [name] Kitchen buPROPion (WELLBUTRIN SR) 150 MG 12 hr tablet Take 150 mg by mouth daily.      . clopidogrel (PLAVIX) 75 MG tablet Take 75 mg by mouth daily.      Mariane Baumgarten Sodium (DSS) 100 MG CAPS Take 100 mg by mouth 2 (two) times daily.      . furosemide (LASIX) 80 MG tablet Take 40 mg by mouth daily.      [name] Kitchen HYDROcodone-acetaminophen (NORCO) 10-325 MG per tablet Take 2 tablets by mouth every 6 (six) hours as needed for moderate pain.      [name] Kitchen insulin glargine (LANTUS) 100 UNIT/ML injection Inject 60 Units into the skin 2 (two) times daily.      . pantoprazole (PROTONIX) 40 MG  tablet Take 40 mg by mouth daily.      . potassium chloride SA (K-DUR,KLOR-CON) 20 MEQ tablet Take 20 mEq by mouth daily.      . pregabalin (LYRICA) 50 MG capsule Take 50 mg by mouth 2 (two) times daily.      [name] Kitchen PRESCRIPTION MEDICATION Take 1 tablet by mouth 2 (two) times daily.      . simvastatin (ZOCOR) 20 MG tablet Take 20 mg by mouth daily.       [name] Kitchen terazosin (HYTRIN) 2 MG capsule Take 4 mg by mouth daily.      . vitamin B-12 (CYANOCOBALAMIN) 1000 MCG tablet Take 1,000 mcg by mouth daily.      [name] Kitchen zolpidem (AMBIEN) 5 MG tablet Take 5 mg by mouth at bedtime as needed for sleep.       Assessment: 80 y/o male with chronic LE venous stasis with increased swelling and erythema over the past few weeks. Pharmacy consulted to begin vancomycin for LE cellulitis. Patient is obese. Renal function is normal.  Goal of Therapy:  Vancomycin trough level 10-15 mcg/ml  Plan:  -Vancomycin 1000 mg IV q12h -Monitor renal function and clinical course  [facility], Pharm.D., BCPS Clinical Pharmacist Pager: [phone] 01/11/2014 3:15 PM

## 2014-01-11 NOTE — Progress Notes (Signed)
Utilization review completed.  

## 2014-01-11 NOTE — ED Notes (Signed)
Pt c/o swelling and pain to B/L legs onset Wednesday. Pt seen by PMD on Friday for same and given antibiotics. Pt present with B/L redness and swelling. Wound noted to left lower leg area. Pt has ace wrap to right lower leg area. Reports multiple blisters to B/L ankles that are busting open and draining.

## 2014-01-12 ENCOUNTER — Encounter (HOSPITAL_COMMUNITY): Payer: Self-pay | Admitting: General Practice

## 2014-01-12 LAB — BASIC METABOLIC PANEL
BUN: 11 mg/dL (ref 6–23)
CO2: 28 mEq/L (ref 19–32)
Calcium: 9.4 mg/dL (ref 8.4–10.5)
Chloride: 105 mEq/L (ref 96–112)
Creatinine, Ser: 1.17 mg/dL (ref 0.50–1.35)
GFR calc Af Amer: 66 mL/min — ABNORMAL LOW (ref >90)
GFR, EST NON AFRICAN AMERICAN: 57 mL/min — AB (ref >90)
GLUCOSE: 164 mg/dL — AB (ref 70–99)
Potassium: 4.2 mEq/L (ref 3.7–5.3)
Sodium: 144 mEq/L (ref 137–147)

## 2014-01-12 LAB — CBC
HCT: 37.7 % — ABNORMAL LOW (ref 39.0–52.0)
HEMOGLOBIN: 11.9 g/dL — AB (ref 13.0–17.0)
MCH: 21.6 pg — AB (ref 26.0–34.0)
MCHC: 31.6 g/dL (ref 30.0–36.0)
MCV: 68.5 fL — AB (ref 78.0–100.0)
Platelets: 255 10*3/uL (ref 150–400)
RBC: 5.5 MIL/uL (ref 4.22–5.81)
RDW: 15 % (ref 11.5–15.5)
WBC: 10.7 10*3/uL — ABNORMAL HIGH (ref 4.0–10.5)

## 2014-01-12 LAB — GLUCOSE, CAPILLARY
GLUCOSE-CAPILLARY: 220 mg/dL — AB (ref 70–99)
Glucose-Capillary: 173 mg/dL — ABNORMAL HIGH (ref 70–99)
Glucose-Capillary: 164 mg/dL — ABNORMAL HIGH (ref 70–99)
Glucose-Capillary: 167 mg/dL — ABNORMAL HIGH (ref 70–99)

## 2014-01-12 NOTE — Evaluation (Signed)
Physical Therapy Evaluation Patient Details Name: Jared Hall MRN: [id number] DOB: May 19, 1931 Today's Date: 01/12/2014 Time: 1045-1100 PT Time Calculation (min): 15 min  PT Assessment / Plan / Recommendation History of Present Illness  The past history diabetes, obesity and hypertension plus chronic venous stasis of the lower extremities who in the past few weeks has noted increased swelling and erythema of his lower legs to the point where he started having wounds that burst open.  Clinical Impression  Pt presents with decreased strength, mobility and gait and will benefit from skilled PT services to address deficits and increase functional independence.    PT Assessment  Patient needs continued PT services    Follow Up Recommendations  Home health PT;Supervision/Assistance - 24 hour    Does the patient have the potential to tolerate intense rehabilitation      Barriers to Discharge Decreased caregiver support lives alone    Equipment Recommendations  None recommended by PT    Recommendations for Other Services OT consult   Frequency Min 3X/week    Precautions / Restrictions Precautions Precautions: Fall Restrictions Weight Bearing Restrictions: No   Pertinent Vitals/Pain No c/o pain, HR 140bpm during mobility, RN made aware      Mobility  Bed Mobility Overal bed mobility: Modified Independent General bed mobility comments: uses rails Transfers Overall transfer level: Needs assistance Equipment used: Rolling walker (2 wheeled) Transfers: Sit to/from Stand Sit to Stand: Min guard General transfer comment: steadying assist for balance and safety Ambulation/Gait Ambulation/Gait assistance: Min assist Ambulation Distance (Feet): 100 Feet Assistive device: Rolling walker (2 wheeled) Gait velocity interpretation: Below normal speed for age/gender General Gait Details: pt able to gait with min steadying assist, cues for posture, cues for deep breathing, 1 standing rest  break, cues for safety with RW    Exercises     PT Diagnosis: Difficulty walking;Generalized weakness  PT Problem List: Decreased strength;Decreased activity tolerance;Decreased balance;Cardiopulmonary status limiting activity;Decreased mobility PT Treatment Interventions: DME instruction;Therapeutic exercise;Balance training;Gait training;Neuromuscular re-education;Modalities;Functional mobility training;Therapeutic activities;Patient/family education     PT Goals(Current goals can be found in the care plan section) Acute Rehab PT Goals Patient Stated Goal: go home PT Goal Formulation: With patient Time For Goal Achievement: 01/19/14 Potential to Achieve Goals: Good  Visit Information  Last PT Received On: 01/12/14 Assistance Needed: +1 History of Present Illness: The past history diabetes, obesity and hypertension plus chronic venous stasis of the lower extremities who in the past few weeks has noted increased swelling and erythema of his lower legs to the point where he started having wounds that burst open.       Prior [place] expects to be discharged to:: Private residence Living Arrangements: Alone Available Help at Discharge: Family;Available PRN/intermittently Type of Home: House Home Access: Ramped entrance Home Layout: One level Home Equipment: [place] - 4 wheels;Walker - 2 wheels Prior Function Level of Independence: Independent with assistive device(s) Comments: not driving, family does shopping for him Communication Communication: No difficulties    Cognition  Cognition Arousal/Alertness: Awake/alert Behavior During Therapy: WFL for tasks assessed/performed Overall Cognitive Status: Within Functional Limits for tasks assessed    Extremity/Trunk Assessment Upper Extremity Assessment Upper Extremity Assessment: Generalized weakness Lower Extremity Assessment Lower Extremity Assessment: Generalized weakness Cervical / Trunk  Assessment Cervical / Trunk Assessment: Kyphotic   Balance    End of Session PT - End of Session Equipment Utilized During Treatment: Gait belt Activity Tolerance: Patient limited by fatigue (HR 140bpm with mobility, RN  notified) Patient left: in bed;with call bell/phone within reach Nurse Communication: Mobility status  GP     DONAWERTH,KAREN 01/12/2014, 11:14 AM

## 2014-01-12 NOTE — Progress Notes (Addendum)
Subjective: Feeling better today. No distress. Alert and conversive  Objective: Weight change:   Intake/Output Summary (Last 24 hours) at 01/12/14 0714 Last data filed at 01/12/14 0525  Gross per 24 hour  Intake    530 ml  Output   3500 ml  Net  -2970 ml   Filed Vitals:   01/11/14 1313 01/11/14 1400 01/11/14 2100 01/12/14 0524  BP: 127/58 139/71 175/69 169/90  Pulse: 77 85 97 100  Temp:  98 F (36.7 C) 98.3 F (36.8 C) 98.6 F (37 C)  TempSrc:   Oral Oral  Resp: _0 Height:      Weight:      SpO2: 95% 97% 97% 98%    General: Alert and oriented x3, no acute distress, looks younger than stated age, full beard  Cardiovascular: Regular rate and rhythm, S1-S2,  The lungs: Clear to auscultation bilaterally  Abdomen: Soft, obese, nontender, positive bowel sounds  Extremities: Patient has chronic venous stasis of the lower extremities with 2+ pitting edema from the knees down. Both legs are significantly erythematous on the anterior aspect with some open wounds measuring approximately 2 inches x2 linear wounds on the left lower extremity below the knee. The erythema and edema extending into the feet always the toes. Poor toenail condition. Evidence of callus formation on the feet. Skin is dry  Neuro: No focal deficits, although evidence of peripheral myopathy is present  Psych: Patient is appropriate, no evidence of psychoses   Lab Results: Results for orders placed during the hospital encounter of 01/11/14 (from the past 48 hour(s))  CBC     Status: Abnormal   Collection Time    01/11/14 10:31 AM      Result Value Ref Range   WBC 8.2  4.0 - 10.5 K/uL   RBC 5.59  4.22 - 5.81 MIL/uL   Hemoglobin 12.4 (*) 13.0 - 17.0 g/dL   HCT 38.9 (*) 39.0 - 52.0 %   MCV 69.6 (*) 78.0 - 100.0 fL   MCH 22.2 (*) 26.0 - 34.0 pg   MCHC 31.9  30.0 - 36.0 g/dL   RDW 15.2  11.5 - 15.5 %   Platelets 240  150 - 400 K/uL  BASIC METABOLIC PANEL     Status: Abnormal   Collection Time     01/11/14 10:31 AM      Result Value Ref Range   Sodium 139  137 - 147 mEq/L   Potassium 4.7  3.7 - 5.3 mEq/L   Chloride 99  96 - 112 mEq/L   CO2 29  19 - 32 mEq/L   Glucose, Bld 219 (*) 70 - 99 mg/dL   BUN 17  6 - 23 mg/dL   Creatinine, Ser 1.25  0.50 - 1.35 mg/dL   Calcium 9.2  8.4 - 10.5 mg/dL   GFR calc non Af Amer 53 (*) >90 mL/min   GFR calc Af Amer 61 (*) >90 mL/min   Comment: (NOTE)     The eGFR has been calculated using the CKD EPI equation.     This calculation has not been validated in all clinical situations.     eGFR's persistently <90 mL/min signify possible Chronic Kidney     Disease.  PRO B NATRIURETIC PEPTIDE     Status: None   Collection Time    01/11/14 10:31 AM      Result Value Ref Range   Pro B Natriuretic peptide (BNP) 158.0  0 - 450 pg/mL  GLUCOSE, CAPILLARY     Status: Abnormal   Collection Time    01/11/14  4:00 PM      Result Value Ref Range   Glucose-Capillary 209 (*) 70 - 99 mg/dL   Comment 1 Documented in Chart     Comment 2 Notify RN    CBC     Status: Abnormal   Collection Time    01/11/14  4:20 PM      Result Value Ref Range   WBC 8.7  4.0 - 10.5 K/uL   RBC 5.29  4.22 - 5.81 MIL/uL   Hemoglobin 11.6 (*) 13.0 - 17.0 g/dL   HCT 36.7 (*) 39.0 - 52.0 %   MCV 69.4 (*) 78.0 - 100.0 fL   MCH 21.9 (*) 26.0 - 34.0 pg   MCHC 31.6  30.0 - 36.0 g/dL   RDW 15.2  11.5 - 15.5 %   Platelets 234  150 - 400 K/uL  CREATININE, SERUM     Status: Abnormal   Collection Time    01/11/14  4:20 PM      Result Value Ref Range   Creatinine, Ser 1.22  0.50 - 1.35 mg/dL   GFR calc non Af Amer 54 (*) >90 mL/min   GFR calc Af Amer 63 (*) >90 mL/min   Comment: (NOTE)     The eGFR has been calculated using the CKD EPI equation.     This calculation has not been validated in all clinical situations.     eGFR's persistently <90 mL/min signify possible Chronic Kidney     Disease.  WOUND CULTURE     Status: None   Collection Time    01/11/14  4:26 PM      Result  Value Ref Range   Specimen Description WOUND RIGHT LEG     Special Requests NONE     Gram Stain       Value: FEW WBC PRESENT, PREDOMINANTLY PMN     NO SQUAMOUS EPITHELIAL CELLS SEEN     FEW GRAM POSITIVE COCCI IN PAIRS     Performed at [facility]   Culture PENDING     Report Status PENDING    GLUCOSE, CAPILLARY     Status: Abnormal   Collection Time    01/11/14  9:24 PM      Result Value Ref Range   Glucose-Capillary 249 (*) 70 - 99 mg/dL   Comment 1 Notify RN    CBC     Status: Abnormal   Collection Time    01/12/14  5:52 AM      Result Value Ref Range   WBC 10.7 (*) 4.0 - 10.5 K/uL   RBC 5.50  4.22 - 5.81 MIL/uL   Hemoglobin 11.9 (*) 13.0 - 17.0 g/dL   HCT 37.7 (*) 39.0 - 52.0 %   MCV 68.5 (*) 78.0 - 100.0 fL   MCH 21.6 (*) 26.0 - 34.0 pg   MCHC 31.6  30.0 - 36.0 g/dL   RDW 15.0  11.5 - 15.5 %   Platelets 255  150 - 400 K/uL  GLUCOSE, CAPILLARY     Status: Abnormal   Collection Time    01/12/14  6:52 AM      Result Value Ref Range   Glucose-Capillary 173 (*) 70 - 99 mg/dL   Comment 1 Notify RN      Studies/Results: Dg Chest 2 View  01/11/2014   CLINICAL DATA:  Shortness of breath, hypertension  EXAM: CHEST  2  VIEW  COMPARISON:  04/18/2012  FINDINGS: Stable cardiomegaly with low lung volumes. Streaky bibasilar densities as before compatible with scarring or atelectasis. Negative for CHF.  Left midlung 12 mm nodular density noted projects over the fourth anterior rib shadow. This could represent superimposed shadows. Difficult to exclude underlying nodule by plain radiography. Consider short-term follow-up versus nonemergent chest CT.  Trachea is midline.  Atherosclerosis of the aorta.  IMPRESSION: cardiomegaly with similar pattern of bibasilar atelectasis versus scarring.  left midlung nodular density versus nodule could represent superimposed shadows versus lung nodule. See above comment.   Electronically Signed   By: Daryll Brod M.D.   On: 01/11/2014 12:10    Medications: Scheduled Meds: . aspirin EC  81 mg Oral Daily  . buPROPion  150 mg Oral Daily  . cefTRIAXone (ROCEPHIN)  IV  1 g Intravenous Q24H  . clopidogrel  75 mg Oral Daily  . docusate sodium  100 mg Oral BID  . enoxaparin (LOVENOX) injection  40 mg Subcutaneous Q24H  . furosemide  40 mg Oral Daily  . insulin aspart  0-15 Units Subcutaneous TID WC  . insulin aspart  0-5 Units Subcutaneous QHS  . insulin glargine  45 Units Subcutaneous BID  . pantoprazole  40 mg Oral Daily  . potassium chloride SA  20 mEq Oral Daily  . pregabalin  50 mg Oral BID  . simvastatin  20 mg Oral Daily  . sodium chloride  3 mL Intravenous Q12H  . terazosin  4 mg Oral Daily  . vancomycin  1,000 mg Intravenous Q12H   Continuous Infusions:  PRN Meds:.sodium chloride, acetaminophen, acetaminophen, morphine injection, ondansetron (ZOFRAN) IV, ondansetron, oxyCODONE, sodium chloride, zolpidem  Assessment/Plan: Active Problems:   Obesity (BMI 30-39.9) - BMI 37   Cellulitis of leg - continue IV antibiotics today and we will transition to by mouth tomorrow. Need to decide on whether or not skilled nursing facility is necessary or not   Essential hypertension, benign   GERD (gastroesophageal reflux disease)   Lower extremity edema - continue Lasix 40 mg daily   Venous stasis dermatitis - continue dressings as per wound care   DM type 2 with diabetic peripheral neuropathy - aware, controlled diabetes as well as possible   No CODE BLUE   Disposition - either to skilled nursing facility or to home with home health. Skilled nursing facility stay would be helpful for conditioning and strengthening for better gait and for treating the arch of the edema and cellulitis completely and carefully   LOS: 1 day   Henrine Screws, MD 01/12/2014, 7:14 AM

## 2014-01-13 LAB — BASIC METABOLIC PANEL
BUN: 12 mg/dL (ref 6–23)
CO2: 28 mEq/L (ref 19–32)
CREATININE: 1.14 mg/dL (ref 0.50–1.35)
Calcium: 9.5 mg/dL (ref 8.4–10.5)
Chloride: 103 mEq/L (ref 96–112)
GFR calc Af Amer: 68 mL/min — ABNORMAL LOW (ref >90)
GFR, EST NON AFRICAN AMERICAN: 59 mL/min — AB (ref >90)
Glucose, Bld: 145 mg/dL — ABNORMAL HIGH (ref 70–99)
Potassium: 3.9 mEq/L (ref 3.7–5.3)
Sodium: 142 mEq/L (ref 137–147)

## 2014-01-13 LAB — GLUCOSE, CAPILLARY
GLUCOSE-CAPILLARY: 154 mg/dL — AB (ref 70–99)
Glucose-Capillary: 192 mg/dL — ABNORMAL HIGH (ref 70–99)

## 2014-01-13 LAB — CBC WITH DIFFERENTIAL/PLATELET
Basophils Absolute: 0 10*3/uL (ref 0.0–0.1)
Basophils Relative: 0 % (ref 0–1)
Eosinophils Absolute: 0.2 10*3/uL (ref 0.0–0.7)
Eosinophils Relative: 2 % (ref 0–5)
HEMATOCRIT: 37.7 % — AB (ref 39.0–52.0)
Hemoglobin: 12.1 g/dL — ABNORMAL LOW (ref 13.0–17.0)
LYMPHS PCT: 14 % (ref 12–46)
Lymphs Abs: 1.4 10*3/uL (ref 0.7–4.0)
MCH: 21.8 pg — ABNORMAL LOW (ref 26.0–34.0)
MCHC: 32.1 g/dL (ref 30.0–36.0)
MCV: 68.1 fL — ABNORMAL LOW (ref 78.0–100.0)
Monocytes Absolute: 1.2 10*3/uL — ABNORMAL HIGH (ref 0.1–1.0)
Monocytes Relative: 12 % (ref 3–12)
NEUTROS ABS: 7 10*3/uL (ref 1.7–7.7)
Neutrophils Relative %: 72 % (ref 43–77)
Platelets: 226 10*3/uL (ref 150–400)
RBC: 5.54 MIL/uL (ref 4.22–5.81)
RDW: 14.9 % (ref 11.5–15.5)
WBC: 9.8 10*3/uL (ref 4.0–10.5)

## 2014-01-13 MED ORDER — AMOXICILLIN-POT CLAVULANATE 875-125 MG PO TABS: 1.0000 | ORAL_TABLET | Freq: Two times a day (BID) | ORAL | Status: AC

## 2014-01-13 MED ORDER — RIFAMPIN 300 MG PO CAPS: 300.0000 mg | ORAL_CAPSULE | Freq: Two times a day (BID) | ORAL | Status: DC

## 2014-01-13 NOTE — Care Management Note (Signed)
CARE MANAGEMENT NOTE 01/13/2014  Patient:  Jared, Hall   Account Number:  [id number]  Date Initiated:  01/13/2014  Documentation initiated by:  Ricki Miller  Subjective/Objective Assessment:   80 yr old male admitted with bilateral lower extremity celllulitis.     Action/Plan:   Patient states he has been previously seen by [facility], wants to use them now. CM called referral to [place] with [facility]. Patient has family support at discharge.   Anticipated DC Date:  01/13/2014   Anticipated DC Plan:  [place]  CM consult      [facility] Choice  HOME HEALTH   Choice offered to / List presented to:     DME arranged  NA        [place] arranged  [facility]-2 PT      [place].   Status of service:  Completed, signed off Medicare Important Message given?   (If response is "NO", the following Medicare IM given date fields will be blank) Date Medicare IM given:   Date Additional Medicare IM given:    Discharge Disposition:  [place]  Per UR Regulation:    If discussed at Long Length of Stay Meetings, dates discussed:    Comments:

## 2014-01-13 NOTE — Discharge Summary (Signed)
Physician Discharge Summary  NAME:Jared Hall  VPX:[id number]  DOB: [birth year]   Admit date: 01/11/2014 Discharge date: 01/13/2014  Discharge Diagnoses:  Active Problems:   Obesity (BMI 30-39.9)   Cellulitis of leg   Essential hypertension, benign   GERD (gastroesophageal reflux disease)   Cellulitis   Lower extremity edema   Venous stasis dermatitis   DM type 2 with diabetic peripheral neuropathy   Cellulitis and abscess of leg   Cellulitis and abscess   Discharge Physical Exam:  General Appearance: Alert, cooperative, no distress, appears stated age  Weight change:   Intake/Output Summary (Last 24 hours) at 01/13/14 1104 Last data filed at 01/13/14 0800  Gross per 24 hour  Intake   1130 ml  Output   1000 ml  Net    130 ml   Filed Vitals:   01/12/14 0524 01/12/14 1400 01/12/14 2125 01/13/14 0555  BP: 169/90 158/70 156/66 153/77  Pulse: 100 103 100 85  Temp: 98.6 F (37 C) 98.3 F (36.8 C) 97.9 F (36.6 C) 98.4 F (36.9 C)  TempSrc: Oral Oral Oral Oral  Resp: 18 20 19 20   Height:      Weight:      SpO2: 98% 93% 97% 98%    Lungs: Clear to auscultation bilaterally, respirations unlabored Heart: Regular rate and rhythm, S1 and S2 normal, no murmur, rub or gallop Abdomen: Soft, non-tender, bowel sounds active all four quadrants, no masses, no organomegaly Extremities: Extremities with improved edema and minimal erythema. No significant drainage. Legs are wrapped Neuro: Alert and oriented. Chronic left-sided weakness secondary to prior stroke  Discharge Condition: Much improved  Hospital Course: Mr. Jared Hall is a very pleasant 80 year old male with a history of diabetes, lower extremity edema and venous insufficiency, obesity, and hypertension. He has had recurrent cellulitis involving the legs in the past and was seen in my office last week and hospitalization was recommended. Patient and his son declined secondary to his son's requirements for his job and they  represented on Sunday to the [facility] emergency room and were admitted. Excessive edema and erythema and leg drainage has improved with elevation of legs and antibiotic therapy and continue diuretics. He is now able to transition to home with home health nursing for wound care and physical therapy for strengthening for gait disorder secondary to deconditioning and prior stroke with left-sided weakness. He has responded nicely to intravenous antibiotic therapy with vancomycin and Rocephin and will be transitioned back to Augmentin with the addition of oral rifampin for 7 more days. He has a lift chair at home that will enable him to elevate his legs whenever he is not walking. I will plan to see him back in the office in 7-10 days  Things to follow up in the outpatient setting: Leg edema and erythema and cellulitis as well as ability to use walker carefully and to participate in physical therapy  Consults: Wound care  Disposition: 03-Skilled [place]  Discharge Orders   Future Orders Complete By Expires   Call MD for:  difficulty breathing, headache or visual disturbances  As directed    Call MD for:  extreme fatigue  As directed    Call MD for:  hives  As directed    Call MD for:  severe uncontrolled pain  As directed    Call MD for:  temperature >100.4  As directed    Diet - low sodium heart healthy  As directed    Discharge instructions  As  directed    Comments:     Whenever not walking, elevate legs to the level of the waist. Never sit with legs dangling down on the floor.  Will need dressings changed on legs daily and will need a home health care nurse to assist and also physical therapy for strengthening and working to improve your stability while walking   Discharge wound care:  As directed    Comments:     As per home nursing   Increase activity slowly  As directed        Medication List         amoxicillin-clavulanate 875-125 MG per tablet  Commonly known as:   AUGMENTIN  Take 1 tablet by mouth 2 (two) times daily.     aspirin EC 81 MG tablet  Take 81 mg by mouth daily.     buPROPion 150 MG 12 hr tablet  Commonly known as:  WELLBUTRIN SR  Take 150 mg by mouth daily.     clopidogrel 75 MG tablet  Commonly known as:  PLAVIX  Take 75 mg by mouth daily.     DSS 100 MG Caps  Take 100 mg by mouth 2 (two) times daily.     furosemide 80 MG tablet  Commonly known as:  LASIX  Take 40 mg by mouth daily.     HYDROcodone-acetaminophen 10-325 MG per tablet  Commonly known as:  NORCO  Take 2 tablets by mouth every 6 (six) hours as needed for moderate pain.     insulin glargine 100 UNIT/ML injection  Commonly known as:  LANTUS  Inject 60 Units into the skin 2 (two) times daily.     pantoprazole 40 MG tablet  Commonly known as:  PROTONIX  Take 40 mg by mouth daily.     potassium chloride SA 20 MEQ tablet  Commonly known as:  K-DUR,KLOR-CON  Take 20 mEq by mouth daily.     pregabalin 50 MG capsule  Commonly known as:  LYRICA  Take 50 mg by mouth 2 (two) times daily.     rifampin 300 MG capsule  Commonly known as:  RIFADIN  Take 1 capsule (300 mg total) by mouth 2 (two) times daily.     simvastatin 20 MG tablet  Commonly known as:  ZOCOR  Take 20 mg by mouth daily.     terazosin 2 MG capsule  Commonly known as:  HYTRIN  Take 4 mg by mouth daily.     VITAMIN B COMPLEX PO  Take 1 tablet by mouth daily.     vitamin B-12 1000 MCG tablet  Commonly known as:  CYANOCOBALAMIN  Take 1,000 mcg by mouth daily.     zolpidem 5 MG tablet  Commonly known as:  AMBIEN  Take 5 mg by mouth at bedtime as needed for sleep.         The results of significant diagnostics from this hospitalization (including imaging, microbiology, ancillary and laboratory) are listed below for reference.    Significant Diagnostic Studies: Dg Chest 2 View  01/11/2014   CLINICAL DATA:  Shortness of breath, hypertension  EXAM: CHEST  2 VIEW  COMPARISON:   04/18/2012  FINDINGS: Stable cardiomegaly with low lung volumes. Streaky bibasilar densities as before compatible with scarring or atelectasis. Negative for CHF.  Left midlung 12 mm nodular density noted projects over the fourth anterior rib shadow. This could represent superimposed shadows. Difficult to exclude underlying nodule by plain radiography. Consider short-term follow-up versus nonemergent chest CT.  Trachea is midline.  Atherosclerosis of the aorta.  IMPRESSION: cardiomegaly with similar pattern of bibasilar atelectasis versus scarring.  left midlung nodular density versus nodule could represent superimposed shadows versus lung nodule. See above comment.   Electronically Signed   By: Daryll Brod M.D.   On: 01/11/2014 12:10    Microbiology: Recent Results (from the past 240 hour(s))  WOUND CULTURE     Status: None   Collection Time    01/11/14  4:26 PM      Result Value Ref Range Status   Specimen Description WOUND RIGHT LEG   Final   Special Requests NONE   Final   Gram Stain     Final   Value: FEW WBC PRESENT, PREDOMINANTLY PMN     NO SQUAMOUS EPITHELIAL CELLS SEEN     FEW GRAM POSITIVE COCCI IN PAIRS     Performed at [facility]   Culture     Final   Value: MULTIPLE ORGANISMS PRESENT, NONE PREDOMINANT     Performed at [facility]   Report Status PENDING   Incomplete     Labs: Results for orders placed during the hospital encounter of 01/11/14  WOUND CULTURE      Result Value Ref Range   Specimen Description WOUND RIGHT LEG     Special Requests NONE     Gram Stain       Value: FEW WBC PRESENT, PREDOMINANTLY PMN     NO SQUAMOUS EPITHELIAL CELLS SEEN     FEW GRAM POSITIVE COCCI IN PAIRS     Performed at [facility]   Culture       Value: MULTIPLE ORGANISMS PRESENT, NONE PREDOMINANT     Performed at [facility]   Report Status PENDING    CBC      Result Value Ref Range   WBC 8.2  4.0 - 10.5 K/uL   RBC 5.59  4.22 - 5.81 MIL/uL    Hemoglobin 12.4 (*) 13.0 - 17.0 g/dL   HCT 38.9 (*) 39.0 - 52.0 %   MCV 69.6 (*) 78.0 - 100.0 fL   MCH 22.2 (*) 26.0 - 34.0 pg   MCHC 31.9  30.0 - 36.0 g/dL   RDW 15.2  11.5 - 15.5 %   Platelets 240  150 - 400 K/uL  BASIC METABOLIC PANEL      Result Value Ref Range   Sodium 139  137 - 147 mEq/L   Potassium 4.7  3.7 - 5.3 mEq/L   Chloride 99  96 - 112 mEq/L   CO2 29  19 - 32 mEq/L   Glucose, Bld 219 (*) 70 - 99 mg/dL   BUN 17  6 - 23 mg/dL   Creatinine, Ser 1.25  0.50 - 1.35 mg/dL   Calcium 9.2  8.4 - 10.5 mg/dL   GFR calc non Af Amer 53 (*) >90 mL/min   GFR calc Af Amer 61 (*) >90 mL/min  PRO B NATRIURETIC PEPTIDE      Result Value Ref Range   Pro B Natriuretic peptide (BNP) 158.0  0 - 450 pg/mL  CBC      Result Value Ref Range   WBC 8.7  4.0 - 10.5 K/uL   RBC 5.29  4.22 - 5.81 MIL/uL   Hemoglobin 11.6 (*) 13.0 - 17.0 g/dL   HCT 36.7 (*) 39.0 - 52.0 %   MCV 69.4 (*) 78.0 - 100.0 fL   MCH 21.9 (*) 26.0 - 34.0 pg  MCHC 31.6  30.0 - 36.0 g/dL   RDW 15.2  11.5 - 15.5 %   Platelets 234  150 - 400 K/uL  CREATININE, SERUM      Result Value Ref Range   Creatinine, Ser 1.22  0.50 - 1.35 mg/dL   GFR calc non Af Amer 54 (*) >90 mL/min   GFR calc Af Amer 63 (*) >90 mL/min  GLUCOSE, CAPILLARY      Result Value Ref Range   Glucose-Capillary 209 (*) 70 - 99 mg/dL   Comment 1 Documented in Chart     Comment 2 Notify RN    BASIC METABOLIC PANEL      Result Value Ref Range   Sodium 144  137 - 147 mEq/L   Potassium 4.2  3.7 - 5.3 mEq/L   Chloride 105  96 - 112 mEq/L   CO2 28  19 - 32 mEq/L   Glucose, Bld 164 (*) 70 - 99 mg/dL   BUN 11  6 - 23 mg/dL   Creatinine, Ser 1.17  0.50 - 1.35 mg/dL   Calcium 9.4  8.4 - 10.5 mg/dL   GFR calc non Af Amer 57 (*) >90 mL/min   GFR calc Af Amer 66 (*) >90 mL/min  CBC      Result Value Ref Range   WBC 10.7 (*) 4.0 - 10.5 K/uL   RBC 5.50  4.22 - 5.81 MIL/uL   Hemoglobin 11.9 (*) 13.0 - 17.0 g/dL   HCT 37.7 (*) 39.0 - 52.0 %   MCV 68.5 (*)  78.0 - 100.0 fL   MCH 21.6 (*) 26.0 - 34.0 pg   MCHC 31.6  30.0 - 36.0 g/dL   RDW 15.0  11.5 - 15.5 %   Platelets 255  150 - 400 K/uL  GLUCOSE, CAPILLARY      Result Value Ref Range   Glucose-Capillary 249 (*) 70 - 99 mg/dL   Comment 1 Notify RN    GLUCOSE, CAPILLARY      Result Value Ref Range   Glucose-Capillary 173 (*) 70 - 99 mg/dL   Comment 1 Notify RN    GLUCOSE, CAPILLARY      Result Value Ref Range   Glucose-Capillary 220 (*) 70 - 99 mg/dL  GLUCOSE, CAPILLARY      Result Value Ref Range   Glucose-Capillary 164 (*) 70 - 99 mg/dL  BASIC METABOLIC PANEL      Result Value Ref Range   Sodium 142  137 - 147 mEq/L   Potassium 3.9  3.7 - 5.3 mEq/L   Chloride 103  96 - 112 mEq/L   CO2 28  19 - 32 mEq/L   Glucose, Bld 145 (*) 70 - 99 mg/dL   BUN 12  6 - 23 mg/dL   Creatinine, Ser 1.14  0.50 - 1.35 mg/dL   Calcium 9.5  8.4 - 10.5 mg/dL   GFR calc non Af Amer 59 (*) >90 mL/min   GFR calc Af Amer 68 (*) >90 mL/min  CBC WITH DIFFERENTIAL      Result Value Ref Range   WBC 9.8  4.0 - 10.5 K/uL   RBC 5.54  4.22 - 5.81 MIL/uL   Hemoglobin 12.1 (*) 13.0 - 17.0 g/dL   HCT 37.7 (*) 39.0 - 52.0 %   MCV 68.1 (*) 78.0 - 100.0 fL   MCH 21.8 (*) 26.0 - 34.0 pg   MCHC 32.1  30.0 - 36.0 g/dL   RDW 14.9  11.5 - 15.5 %  Platelets 226  150 - 400 K/uL   Neutrophils Relative % 72  43 - 77 %   Lymphocytes Relative 14  12 - 46 %   Monocytes Relative 12  3 - 12 %   Eosinophils Relative 2  0 - 5 %   Basophils Relative 0  0 - 1 %   Neutro Abs 7.0  1.7 - 7.7 K/uL   Lymphs Abs 1.4  0.7 - 4.0 K/uL   Monocytes Absolute 1.2 (*) 0.1 - 1.0 K/uL   Eosinophils Absolute 0.2  0.0 - 0.7 K/uL   Basophils Absolute 0.0  0.0 - 0.1 K/uL   RBC Morphology ELLIPTOCYTES    GLUCOSE, CAPILLARY      Result Value Ref Range   Glucose-Capillary 167 (*) 70 - 99 mg/dL  GLUCOSE, CAPILLARY      Result Value Ref Range   Glucose-Capillary 154 (*) 70 - 99 mg/dL    Time coordinating discharge: 37  minutes  Signed: Henrine Screws, MD 01/13/2014, 11:04 AM

## 2014-01-13 NOTE — Progress Notes (Signed)
Discharge instructions given to and explained to patient. Patient denies questions or concerns. Patient prescriptions electronically sent to pharmacy for pick up. IV removed. Vital signs stable. Patient discharged via wheelchair escorted by RN.

## 2014-01-15 LAB — WOUND CULTURE

## 2014-01-16 DIAGNOSIS — E669 Obesity, unspecified: Secondary | ICD-10-CM | POA: Diagnosis not present

## 2014-01-16 DIAGNOSIS — L97809 Non-pressure chronic ulcer of other part of unspecified lower leg with unspecified severity: Secondary | ICD-10-CM | POA: Diagnosis not present

## 2014-01-16 DIAGNOSIS — E1149 Type 2 diabetes mellitus with other diabetic neurological complication: Secondary | ICD-10-CM | POA: Diagnosis not present

## 2014-01-16 DIAGNOSIS — L02419 Cutaneous abscess of limb, unspecified: Secondary | ICD-10-CM | POA: Diagnosis not present

## 2014-01-16 DIAGNOSIS — I10 Essential (primary) hypertension: Secondary | ICD-10-CM | POA: Diagnosis not present

## 2014-01-16 DIAGNOSIS — L03119 Cellulitis of unspecified part of limb: Secondary | ICD-10-CM | POA: Diagnosis not present

## 2014-01-16 DIAGNOSIS — G909 Disorder of the autonomic nervous system, unspecified: Secondary | ICD-10-CM | POA: Diagnosis not present

## 2014-01-16 DIAGNOSIS — I872 Venous insufficiency (chronic) (peripheral): Secondary | ICD-10-CM | POA: Diagnosis not present

## 2014-01-19 DIAGNOSIS — I872 Venous insufficiency (chronic) (peripheral): Secondary | ICD-10-CM | POA: Diagnosis not present

## 2014-01-19 DIAGNOSIS — I10 Essential (primary) hypertension: Secondary | ICD-10-CM | POA: Diagnosis not present

## 2014-01-19 DIAGNOSIS — E1149 Type 2 diabetes mellitus with other diabetic neurological complication: Secondary | ICD-10-CM | POA: Diagnosis not present

## 2014-01-19 DIAGNOSIS — L02419 Cutaneous abscess of limb, unspecified: Secondary | ICD-10-CM | POA: Diagnosis not present

## 2014-01-19 DIAGNOSIS — L97809 Non-pressure chronic ulcer of other part of unspecified lower leg with unspecified severity: Secondary | ICD-10-CM | POA: Diagnosis not present

## 2014-01-19 DIAGNOSIS — G909 Disorder of the autonomic nervous system, unspecified: Secondary | ICD-10-CM | POA: Diagnosis not present

## 2014-01-22 DIAGNOSIS — G909 Disorder of the autonomic nervous system, unspecified: Secondary | ICD-10-CM | POA: Diagnosis not present

## 2014-01-22 DIAGNOSIS — I872 Venous insufficiency (chronic) (peripheral): Secondary | ICD-10-CM | POA: Diagnosis not present

## 2014-01-22 DIAGNOSIS — L97809 Non-pressure chronic ulcer of other part of unspecified lower leg with unspecified severity: Secondary | ICD-10-CM | POA: Diagnosis not present

## 2014-01-22 DIAGNOSIS — I10 Essential (primary) hypertension: Secondary | ICD-10-CM | POA: Diagnosis not present

## 2014-01-22 DIAGNOSIS — L02419 Cutaneous abscess of limb, unspecified: Secondary | ICD-10-CM | POA: Diagnosis not present

## 2014-01-22 DIAGNOSIS — E1149 Type 2 diabetes mellitus with other diabetic neurological complication: Secondary | ICD-10-CM | POA: Diagnosis not present

## 2014-01-24 DIAGNOSIS — I10 Essential (primary) hypertension: Secondary | ICD-10-CM | POA: Diagnosis not present

## 2014-01-24 DIAGNOSIS — G909 Disorder of the autonomic nervous system, unspecified: Secondary | ICD-10-CM | POA: Diagnosis not present

## 2014-01-24 DIAGNOSIS — L97809 Non-pressure chronic ulcer of other part of unspecified lower leg with unspecified severity: Secondary | ICD-10-CM | POA: Diagnosis not present

## 2014-01-24 DIAGNOSIS — L02419 Cutaneous abscess of limb, unspecified: Secondary | ICD-10-CM | POA: Diagnosis not present

## 2014-01-24 DIAGNOSIS — E1149 Type 2 diabetes mellitus with other diabetic neurological complication: Secondary | ICD-10-CM | POA: Diagnosis not present

## 2014-01-24 DIAGNOSIS — I872 Venous insufficiency (chronic) (peripheral): Secondary | ICD-10-CM | POA: Diagnosis not present

## 2014-01-26 DIAGNOSIS — L97809 Non-pressure chronic ulcer of other part of unspecified lower leg with unspecified severity: Secondary | ICD-10-CM | POA: Diagnosis not present

## 2014-01-26 DIAGNOSIS — E1149 Type 2 diabetes mellitus with other diabetic neurological complication: Secondary | ICD-10-CM | POA: Diagnosis not present

## 2014-01-26 DIAGNOSIS — I872 Venous insufficiency (chronic) (peripheral): Secondary | ICD-10-CM | POA: Diagnosis not present

## 2014-01-26 DIAGNOSIS — L02419 Cutaneous abscess of limb, unspecified: Secondary | ICD-10-CM | POA: Diagnosis not present

## 2014-01-26 DIAGNOSIS — G909 Disorder of the autonomic nervous system, unspecified: Secondary | ICD-10-CM | POA: Diagnosis not present

## 2014-01-26 DIAGNOSIS — I10 Essential (primary) hypertension: Secondary | ICD-10-CM | POA: Diagnosis not present

## 2014-01-27 DIAGNOSIS — L97809 Non-pressure chronic ulcer of other part of unspecified lower leg with unspecified severity: Secondary | ICD-10-CM | POA: Diagnosis not present

## 2014-01-27 DIAGNOSIS — E1149 Type 2 diabetes mellitus with other diabetic neurological complication: Secondary | ICD-10-CM | POA: Diagnosis not present

## 2014-01-27 DIAGNOSIS — G909 Disorder of the autonomic nervous system, unspecified: Secondary | ICD-10-CM | POA: Diagnosis not present

## 2014-01-27 DIAGNOSIS — L02419 Cutaneous abscess of limb, unspecified: Secondary | ICD-10-CM | POA: Diagnosis not present

## 2014-01-27 DIAGNOSIS — I872 Venous insufficiency (chronic) (peripheral): Secondary | ICD-10-CM | POA: Diagnosis not present

## 2014-01-27 DIAGNOSIS — L03119 Cellulitis of unspecified part of limb: Secondary | ICD-10-CM | POA: Diagnosis not present

## 2014-01-27 DIAGNOSIS — I10 Essential (primary) hypertension: Secondary | ICD-10-CM | POA: Diagnosis not present

## 2014-01-28 DIAGNOSIS — I10 Essential (primary) hypertension: Secondary | ICD-10-CM | POA: Diagnosis not present

## 2014-01-28 DIAGNOSIS — G909 Disorder of the autonomic nervous system, unspecified: Secondary | ICD-10-CM | POA: Diagnosis not present

## 2014-01-28 DIAGNOSIS — L97809 Non-pressure chronic ulcer of other part of unspecified lower leg with unspecified severity: Secondary | ICD-10-CM | POA: Diagnosis not present

## 2014-01-28 DIAGNOSIS — L02419 Cutaneous abscess of limb, unspecified: Secondary | ICD-10-CM | POA: Diagnosis not present

## 2014-01-28 DIAGNOSIS — I872 Venous insufficiency (chronic) (peripheral): Secondary | ICD-10-CM | POA: Diagnosis not present

## 2014-01-28 DIAGNOSIS — E1149 Type 2 diabetes mellitus with other diabetic neurological complication: Secondary | ICD-10-CM | POA: Diagnosis not present

## 2014-01-28 DIAGNOSIS — L03119 Cellulitis of unspecified part of limb: Secondary | ICD-10-CM | POA: Diagnosis not present

## 2014-01-29 DIAGNOSIS — I10 Essential (primary) hypertension: Secondary | ICD-10-CM | POA: Diagnosis not present

## 2014-01-29 DIAGNOSIS — L97809 Non-pressure chronic ulcer of other part of unspecified lower leg with unspecified severity: Secondary | ICD-10-CM | POA: Diagnosis not present

## 2014-01-29 DIAGNOSIS — E1149 Type 2 diabetes mellitus with other diabetic neurological complication: Secondary | ICD-10-CM | POA: Diagnosis not present

## 2014-01-29 DIAGNOSIS — I872 Venous insufficiency (chronic) (peripheral): Secondary | ICD-10-CM | POA: Diagnosis not present

## 2014-01-29 DIAGNOSIS — G909 Disorder of the autonomic nervous system, unspecified: Secondary | ICD-10-CM | POA: Diagnosis not present

## 2014-01-29 DIAGNOSIS — L02419 Cutaneous abscess of limb, unspecified: Secondary | ICD-10-CM | POA: Diagnosis not present

## 2014-01-30 DIAGNOSIS — L97809 Non-pressure chronic ulcer of other part of unspecified lower leg with unspecified severity: Secondary | ICD-10-CM | POA: Diagnosis not present

## 2014-01-30 DIAGNOSIS — E1149 Type 2 diabetes mellitus with other diabetic neurological complication: Secondary | ICD-10-CM | POA: Diagnosis not present

## 2014-01-30 DIAGNOSIS — G909 Disorder of the autonomic nervous system, unspecified: Secondary | ICD-10-CM | POA: Diagnosis not present

## 2014-01-30 DIAGNOSIS — I10 Essential (primary) hypertension: Secondary | ICD-10-CM | POA: Diagnosis not present

## 2014-01-30 DIAGNOSIS — L02419 Cutaneous abscess of limb, unspecified: Secondary | ICD-10-CM | POA: Diagnosis not present

## 2014-01-30 DIAGNOSIS — I872 Venous insufficiency (chronic) (peripheral): Secondary | ICD-10-CM | POA: Diagnosis not present

## 2014-02-02 DIAGNOSIS — E1149 Type 2 diabetes mellitus with other diabetic neurological complication: Secondary | ICD-10-CM | POA: Diagnosis not present

## 2014-02-02 DIAGNOSIS — G909 Disorder of the autonomic nervous system, unspecified: Secondary | ICD-10-CM | POA: Diagnosis not present

## 2014-02-02 DIAGNOSIS — I872 Venous insufficiency (chronic) (peripheral): Secondary | ICD-10-CM | POA: Diagnosis not present

## 2014-02-02 DIAGNOSIS — L02419 Cutaneous abscess of limb, unspecified: Secondary | ICD-10-CM | POA: Diagnosis not present

## 2014-02-02 DIAGNOSIS — L97809 Non-pressure chronic ulcer of other part of unspecified lower leg with unspecified severity: Secondary | ICD-10-CM | POA: Diagnosis not present

## 2014-02-02 DIAGNOSIS — I10 Essential (primary) hypertension: Secondary | ICD-10-CM | POA: Diagnosis not present

## 2014-02-03 DIAGNOSIS — E1149 Type 2 diabetes mellitus with other diabetic neurological complication: Secondary | ICD-10-CM | POA: Diagnosis not present

## 2014-02-03 DIAGNOSIS — I872 Venous insufficiency (chronic) (peripheral): Secondary | ICD-10-CM | POA: Diagnosis not present

## 2014-02-03 DIAGNOSIS — L02419 Cutaneous abscess of limb, unspecified: Secondary | ICD-10-CM | POA: Diagnosis not present

## 2014-02-03 DIAGNOSIS — L97809 Non-pressure chronic ulcer of other part of unspecified lower leg with unspecified severity: Secondary | ICD-10-CM | POA: Diagnosis not present

## 2014-02-03 DIAGNOSIS — G909 Disorder of the autonomic nervous system, unspecified: Secondary | ICD-10-CM | POA: Diagnosis not present

## 2014-02-03 DIAGNOSIS — I10 Essential (primary) hypertension: Secondary | ICD-10-CM | POA: Diagnosis not present

## 2014-02-05 DIAGNOSIS — I10 Essential (primary) hypertension: Secondary | ICD-10-CM | POA: Diagnosis not present

## 2014-02-05 DIAGNOSIS — L97809 Non-pressure chronic ulcer of other part of unspecified lower leg with unspecified severity: Secondary | ICD-10-CM | POA: Diagnosis not present

## 2014-02-05 DIAGNOSIS — L02419 Cutaneous abscess of limb, unspecified: Secondary | ICD-10-CM | POA: Diagnosis not present

## 2014-02-05 DIAGNOSIS — E1149 Type 2 diabetes mellitus with other diabetic neurological complication: Secondary | ICD-10-CM | POA: Diagnosis not present

## 2014-02-05 DIAGNOSIS — I872 Venous insufficiency (chronic) (peripheral): Secondary | ICD-10-CM | POA: Diagnosis not present

## 2014-02-05 DIAGNOSIS — G909 Disorder of the autonomic nervous system, unspecified: Secondary | ICD-10-CM | POA: Diagnosis not present

## 2014-02-06 DIAGNOSIS — I10 Essential (primary) hypertension: Secondary | ICD-10-CM | POA: Diagnosis not present

## 2014-02-06 DIAGNOSIS — L02419 Cutaneous abscess of limb, unspecified: Secondary | ICD-10-CM | POA: Diagnosis not present

## 2014-02-06 DIAGNOSIS — L97809 Non-pressure chronic ulcer of other part of unspecified lower leg with unspecified severity: Secondary | ICD-10-CM | POA: Diagnosis not present

## 2014-02-06 DIAGNOSIS — G909 Disorder of the autonomic nervous system, unspecified: Secondary | ICD-10-CM | POA: Diagnosis not present

## 2014-02-06 DIAGNOSIS — E1149 Type 2 diabetes mellitus with other diabetic neurological complication: Secondary | ICD-10-CM | POA: Diagnosis not present

## 2014-02-06 DIAGNOSIS — I872 Venous insufficiency (chronic) (peripheral): Secondary | ICD-10-CM | POA: Diagnosis not present

## 2014-02-09 DIAGNOSIS — L97809 Non-pressure chronic ulcer of other part of unspecified lower leg with unspecified severity: Secondary | ICD-10-CM | POA: Diagnosis not present

## 2014-02-09 DIAGNOSIS — L02419 Cutaneous abscess of limb, unspecified: Secondary | ICD-10-CM | POA: Diagnosis not present

## 2014-02-09 DIAGNOSIS — I10 Essential (primary) hypertension: Secondary | ICD-10-CM | POA: Diagnosis not present

## 2014-02-09 DIAGNOSIS — E1149 Type 2 diabetes mellitus with other diabetic neurological complication: Secondary | ICD-10-CM | POA: Diagnosis not present

## 2014-02-09 DIAGNOSIS — G909 Disorder of the autonomic nervous system, unspecified: Secondary | ICD-10-CM | POA: Diagnosis not present

## 2014-02-09 DIAGNOSIS — I872 Venous insufficiency (chronic) (peripheral): Secondary | ICD-10-CM | POA: Diagnosis not present

## 2014-02-10 DIAGNOSIS — G909 Disorder of the autonomic nervous system, unspecified: Secondary | ICD-10-CM | POA: Diagnosis not present

## 2014-02-10 DIAGNOSIS — I872 Venous insufficiency (chronic) (peripheral): Secondary | ICD-10-CM | POA: Diagnosis not present

## 2014-02-10 DIAGNOSIS — L03119 Cellulitis of unspecified part of limb: Secondary | ICD-10-CM | POA: Diagnosis not present

## 2014-02-10 DIAGNOSIS — I10 Essential (primary) hypertension: Secondary | ICD-10-CM | POA: Diagnosis not present

## 2014-02-10 DIAGNOSIS — E1149 Type 2 diabetes mellitus with other diabetic neurological complication: Secondary | ICD-10-CM | POA: Diagnosis not present

## 2014-02-10 DIAGNOSIS — L02419 Cutaneous abscess of limb, unspecified: Secondary | ICD-10-CM | POA: Diagnosis not present

## 2014-02-10 DIAGNOSIS — L97809 Non-pressure chronic ulcer of other part of unspecified lower leg with unspecified severity: Secondary | ICD-10-CM | POA: Diagnosis not present

## 2014-02-12 DIAGNOSIS — M1710 Unilateral primary osteoarthritis, unspecified knee: Secondary | ICD-10-CM | POA: Diagnosis not present

## 2014-02-12 DIAGNOSIS — I6789 Other cerebrovascular disease: Secondary | ICD-10-CM | POA: Diagnosis not present

## 2014-02-12 DIAGNOSIS — L02419 Cutaneous abscess of limb, unspecified: Secondary | ICD-10-CM | POA: Diagnosis not present

## 2014-02-12 DIAGNOSIS — E1349 Other specified diabetes mellitus with other diabetic neurological complication: Secondary | ICD-10-CM | POA: Diagnosis not present

## 2014-02-12 DIAGNOSIS — R609 Edema, unspecified: Secondary | ICD-10-CM | POA: Diagnosis not present

## 2014-02-12 DIAGNOSIS — E1149 Type 2 diabetes mellitus with other diabetic neurological complication: Secondary | ICD-10-CM | POA: Diagnosis not present

## 2014-02-12 DIAGNOSIS — D510 Vitamin B12 deficiency anemia due to intrinsic factor deficiency: Secondary | ICD-10-CM | POA: Diagnosis not present

## 2014-02-12 DIAGNOSIS — I10 Essential (primary) hypertension: Secondary | ICD-10-CM | POA: Diagnosis not present

## 2014-02-12 DIAGNOSIS — IMO0001 Reserved for inherently not codable concepts without codable children: Secondary | ICD-10-CM | POA: Diagnosis not present

## 2014-02-12 DIAGNOSIS — L97809 Non-pressure chronic ulcer of other part of unspecified lower leg with unspecified severity: Secondary | ICD-10-CM | POA: Diagnosis not present

## 2014-02-12 DIAGNOSIS — N400 Enlarged prostate without lower urinary tract symptoms: Secondary | ICD-10-CM | POA: Diagnosis not present

## 2014-02-12 DIAGNOSIS — I872 Venous insufficiency (chronic) (peripheral): Secondary | ICD-10-CM | POA: Diagnosis not present

## 2014-02-12 DIAGNOSIS — G909 Disorder of the autonomic nervous system, unspecified: Secondary | ICD-10-CM | POA: Diagnosis not present

## 2014-02-12 DIAGNOSIS — IMO0002 Reserved for concepts with insufficient information to code with codable children: Secondary | ICD-10-CM | POA: Diagnosis not present

## 2014-02-13 DIAGNOSIS — L02419 Cutaneous abscess of limb, unspecified: Secondary | ICD-10-CM | POA: Diagnosis not present

## 2014-02-13 DIAGNOSIS — I872 Venous insufficiency (chronic) (peripheral): Secondary | ICD-10-CM | POA: Diagnosis not present

## 2014-02-13 DIAGNOSIS — L97809 Non-pressure chronic ulcer of other part of unspecified lower leg with unspecified severity: Secondary | ICD-10-CM | POA: Diagnosis not present

## 2014-02-13 DIAGNOSIS — I10 Essential (primary) hypertension: Secondary | ICD-10-CM | POA: Diagnosis not present

## 2014-02-13 DIAGNOSIS — E1149 Type 2 diabetes mellitus with other diabetic neurological complication: Secondary | ICD-10-CM | POA: Diagnosis not present

## 2014-02-13 DIAGNOSIS — G909 Disorder of the autonomic nervous system, unspecified: Secondary | ICD-10-CM | POA: Diagnosis not present

## 2014-02-13 DIAGNOSIS — L03119 Cellulitis of unspecified part of limb: Secondary | ICD-10-CM | POA: Diagnosis not present

## 2014-02-15 DIAGNOSIS — I872 Venous insufficiency (chronic) (peripheral): Secondary | ICD-10-CM | POA: Diagnosis not present

## 2014-02-15 DIAGNOSIS — I10 Essential (primary) hypertension: Secondary | ICD-10-CM | POA: Diagnosis not present

## 2014-02-15 DIAGNOSIS — E1149 Type 2 diabetes mellitus with other diabetic neurological complication: Secondary | ICD-10-CM | POA: Diagnosis not present

## 2014-02-15 DIAGNOSIS — L02419 Cutaneous abscess of limb, unspecified: Secondary | ICD-10-CM | POA: Diagnosis not present

## 2014-02-15 DIAGNOSIS — L97809 Non-pressure chronic ulcer of other part of unspecified lower leg with unspecified severity: Secondary | ICD-10-CM | POA: Diagnosis not present

## 2014-02-15 DIAGNOSIS — G909 Disorder of the autonomic nervous system, unspecified: Secondary | ICD-10-CM | POA: Diagnosis not present

## 2014-02-19 DIAGNOSIS — I10 Essential (primary) hypertension: Secondary | ICD-10-CM | POA: Diagnosis not present

## 2014-02-19 DIAGNOSIS — I872 Venous insufficiency (chronic) (peripheral): Secondary | ICD-10-CM | POA: Diagnosis not present

## 2014-02-19 DIAGNOSIS — L02419 Cutaneous abscess of limb, unspecified: Secondary | ICD-10-CM | POA: Diagnosis not present

## 2014-02-19 DIAGNOSIS — G909 Disorder of the autonomic nervous system, unspecified: Secondary | ICD-10-CM | POA: Diagnosis not present

## 2014-02-19 DIAGNOSIS — E1149 Type 2 diabetes mellitus with other diabetic neurological complication: Secondary | ICD-10-CM | POA: Diagnosis not present

## 2014-02-19 DIAGNOSIS — L97809 Non-pressure chronic ulcer of other part of unspecified lower leg with unspecified severity: Secondary | ICD-10-CM | POA: Diagnosis not present

## 2014-02-24 DIAGNOSIS — E1149 Type 2 diabetes mellitus with other diabetic neurological complication: Secondary | ICD-10-CM | POA: Diagnosis not present

## 2014-02-24 DIAGNOSIS — I10 Essential (primary) hypertension: Secondary | ICD-10-CM | POA: Diagnosis not present

## 2014-02-24 DIAGNOSIS — L02419 Cutaneous abscess of limb, unspecified: Secondary | ICD-10-CM | POA: Diagnosis not present

## 2014-02-24 DIAGNOSIS — G909 Disorder of the autonomic nervous system, unspecified: Secondary | ICD-10-CM | POA: Diagnosis not present

## 2014-02-24 DIAGNOSIS — L97809 Non-pressure chronic ulcer of other part of unspecified lower leg with unspecified severity: Secondary | ICD-10-CM | POA: Diagnosis not present

## 2014-02-24 DIAGNOSIS — I872 Venous insufficiency (chronic) (peripheral): Secondary | ICD-10-CM | POA: Diagnosis not present

## 2014-02-27 DIAGNOSIS — L97809 Non-pressure chronic ulcer of other part of unspecified lower leg with unspecified severity: Secondary | ICD-10-CM | POA: Diagnosis not present

## 2014-02-27 DIAGNOSIS — L02419 Cutaneous abscess of limb, unspecified: Secondary | ICD-10-CM | POA: Diagnosis not present

## 2014-02-27 DIAGNOSIS — E1149 Type 2 diabetes mellitus with other diabetic neurological complication: Secondary | ICD-10-CM | POA: Diagnosis not present

## 2014-02-27 DIAGNOSIS — G909 Disorder of the autonomic nervous system, unspecified: Secondary | ICD-10-CM | POA: Diagnosis not present

## 2014-02-27 DIAGNOSIS — I10 Essential (primary) hypertension: Secondary | ICD-10-CM | POA: Diagnosis not present

## 2014-02-27 DIAGNOSIS — I872 Venous insufficiency (chronic) (peripheral): Secondary | ICD-10-CM | POA: Diagnosis not present

## 2014-03-02 DIAGNOSIS — I872 Venous insufficiency (chronic) (peripheral): Secondary | ICD-10-CM | POA: Diagnosis not present

## 2014-03-02 DIAGNOSIS — L02419 Cutaneous abscess of limb, unspecified: Secondary | ICD-10-CM | POA: Diagnosis not present

## 2014-03-02 DIAGNOSIS — E1149 Type 2 diabetes mellitus with other diabetic neurological complication: Secondary | ICD-10-CM | POA: Diagnosis not present

## 2014-03-02 DIAGNOSIS — L03119 Cellulitis of unspecified part of limb: Secondary | ICD-10-CM | POA: Diagnosis not present

## 2014-03-02 DIAGNOSIS — I10 Essential (primary) hypertension: Secondary | ICD-10-CM | POA: Diagnosis not present

## 2014-03-02 DIAGNOSIS — L97809 Non-pressure chronic ulcer of other part of unspecified lower leg with unspecified severity: Secondary | ICD-10-CM | POA: Diagnosis not present

## 2014-03-02 DIAGNOSIS — G909 Disorder of the autonomic nervous system, unspecified: Secondary | ICD-10-CM | POA: Diagnosis not present

## 2014-03-05 DIAGNOSIS — L02419 Cutaneous abscess of limb, unspecified: Secondary | ICD-10-CM | POA: Diagnosis not present

## 2014-03-05 DIAGNOSIS — I872 Venous insufficiency (chronic) (peripheral): Secondary | ICD-10-CM | POA: Diagnosis not present

## 2014-03-05 DIAGNOSIS — G909 Disorder of the autonomic nervous system, unspecified: Secondary | ICD-10-CM | POA: Diagnosis not present

## 2014-03-05 DIAGNOSIS — E1149 Type 2 diabetes mellitus with other diabetic neurological complication: Secondary | ICD-10-CM | POA: Diagnosis not present

## 2014-03-05 DIAGNOSIS — I10 Essential (primary) hypertension: Secondary | ICD-10-CM | POA: Diagnosis not present

## 2014-03-05 DIAGNOSIS — L97809 Non-pressure chronic ulcer of other part of unspecified lower leg with unspecified severity: Secondary | ICD-10-CM | POA: Diagnosis not present

## 2014-03-09 DIAGNOSIS — E1149 Type 2 diabetes mellitus with other diabetic neurological complication: Secondary | ICD-10-CM | POA: Diagnosis not present

## 2014-03-09 DIAGNOSIS — I10 Essential (primary) hypertension: Secondary | ICD-10-CM | POA: Diagnosis not present

## 2014-03-09 DIAGNOSIS — G909 Disorder of the autonomic nervous system, unspecified: Secondary | ICD-10-CM | POA: Diagnosis not present

## 2014-03-09 DIAGNOSIS — L02419 Cutaneous abscess of limb, unspecified: Secondary | ICD-10-CM | POA: Diagnosis not present

## 2014-03-09 DIAGNOSIS — I872 Venous insufficiency (chronic) (peripheral): Secondary | ICD-10-CM | POA: Diagnosis not present

## 2014-03-09 DIAGNOSIS — L97809 Non-pressure chronic ulcer of other part of unspecified lower leg with unspecified severity: Secondary | ICD-10-CM | POA: Diagnosis not present

## 2014-03-12 DIAGNOSIS — L03119 Cellulitis of unspecified part of limb: Secondary | ICD-10-CM | POA: Diagnosis not present

## 2014-03-12 DIAGNOSIS — L97809 Non-pressure chronic ulcer of other part of unspecified lower leg with unspecified severity: Secondary | ICD-10-CM | POA: Diagnosis not present

## 2014-03-12 DIAGNOSIS — E1149 Type 2 diabetes mellitus with other diabetic neurological complication: Secondary | ICD-10-CM | POA: Diagnosis not present

## 2014-03-12 DIAGNOSIS — L02419 Cutaneous abscess of limb, unspecified: Secondary | ICD-10-CM | POA: Diagnosis not present

## 2014-03-12 DIAGNOSIS — G909 Disorder of the autonomic nervous system, unspecified: Secondary | ICD-10-CM | POA: Diagnosis not present

## 2014-03-12 DIAGNOSIS — I10 Essential (primary) hypertension: Secondary | ICD-10-CM | POA: Diagnosis not present

## 2014-03-12 DIAGNOSIS — I872 Venous insufficiency (chronic) (peripheral): Secondary | ICD-10-CM | POA: Diagnosis not present

## 2014-05-14 DIAGNOSIS — N400 Enlarged prostate without lower urinary tract symptoms: Secondary | ICD-10-CM | POA: Diagnosis not present

## 2014-05-14 DIAGNOSIS — R609 Edema, unspecified: Secondary | ICD-10-CM | POA: Diagnosis not present

## 2014-05-14 DIAGNOSIS — I10 Essential (primary) hypertension: Secondary | ICD-10-CM | POA: Diagnosis not present

## 2014-05-14 DIAGNOSIS — IMO0001 Reserved for inherently not codable concepts without codable children: Secondary | ICD-10-CM | POA: Diagnosis not present

## 2014-05-14 DIAGNOSIS — L03119 Cellulitis of unspecified part of limb: Secondary | ICD-10-CM | POA: Diagnosis not present

## 2014-05-14 DIAGNOSIS — M1710 Unilateral primary osteoarthritis, unspecified knee: Secondary | ICD-10-CM | POA: Diagnosis not present

## 2014-05-14 DIAGNOSIS — IMO0002 Reserved for concepts with insufficient information to code with codable children: Secondary | ICD-10-CM | POA: Diagnosis not present

## 2014-05-14 DIAGNOSIS — I6789 Other cerebrovascular disease: Secondary | ICD-10-CM | POA: Diagnosis not present

## 2014-05-14 DIAGNOSIS — N183 Chronic kidney disease, stage 3 unspecified: Secondary | ICD-10-CM | POA: Diagnosis not present

## 2014-05-14 DIAGNOSIS — L02419 Cutaneous abscess of limb, unspecified: Secondary | ICD-10-CM | POA: Diagnosis not present

## 2014-05-14 DIAGNOSIS — E1349 Other specified diabetes mellitus with other diabetic neurological complication: Secondary | ICD-10-CM | POA: Diagnosis not present

## 2014-06-15 ENCOUNTER — Encounter: Payer: Self-pay | Admitting: *Deleted

## 2014-08-17 DIAGNOSIS — F329 Major depressive disorder, single episode, unspecified: Secondary | ICD-10-CM | POA: Diagnosis not present

## 2014-08-17 DIAGNOSIS — N400 Enlarged prostate without lower urinary tract symptoms: Secondary | ICD-10-CM | POA: Diagnosis not present

## 2014-08-17 DIAGNOSIS — IMO0002 Reserved for concepts with insufficient information to code with codable children: Secondary | ICD-10-CM | POA: Diagnosis not present

## 2014-08-17 DIAGNOSIS — R609 Edema, unspecified: Secondary | ICD-10-CM | POA: Diagnosis not present

## 2014-08-17 DIAGNOSIS — I6789 Other cerebrovascular disease: Secondary | ICD-10-CM | POA: Diagnosis not present

## 2014-08-17 DIAGNOSIS — Z23 Encounter for immunization: Secondary | ICD-10-CM | POA: Diagnosis not present

## 2014-08-17 DIAGNOSIS — E1349 Other specified diabetes mellitus with other diabetic neurological complication: Secondary | ICD-10-CM | POA: Diagnosis not present

## 2014-08-17 DIAGNOSIS — I10 Essential (primary) hypertension: Secondary | ICD-10-CM | POA: Diagnosis not present

## 2014-08-17 DIAGNOSIS — IMO0001 Reserved for inherently not codable concepts without codable children: Secondary | ICD-10-CM | POA: Diagnosis not present

## 2014-08-17 DIAGNOSIS — M1710 Unilateral primary osteoarthritis, unspecified knee: Secondary | ICD-10-CM | POA: Diagnosis not present

## 2014-09-24 DIAGNOSIS — H52223 Regular astigmatism, bilateral: Secondary | ICD-10-CM | POA: Diagnosis not present

## 2014-09-24 DIAGNOSIS — H04123 Dry eye syndrome of bilateral lacrimal glands: Secondary | ICD-10-CM | POA: Diagnosis not present

## 2014-09-24 DIAGNOSIS — H524 Presbyopia: Secondary | ICD-10-CM | POA: Diagnosis not present

## 2014-09-24 DIAGNOSIS — H5203 Hypermetropia, bilateral: Secondary | ICD-10-CM | POA: Diagnosis not present

## 2014-09-24 DIAGNOSIS — E119 Type 2 diabetes mellitus without complications: Secondary | ICD-10-CM | POA: Diagnosis not present

## 2014-09-24 DIAGNOSIS — H25813 Combined forms of age-related cataract, bilateral: Secondary | ICD-10-CM | POA: Diagnosis not present

## 2014-11-24 DIAGNOSIS — L03115 Cellulitis of right lower limb: Secondary | ICD-10-CM | POA: Diagnosis not present

## 2014-11-24 DIAGNOSIS — E1165 Type 2 diabetes mellitus with hyperglycemia: Secondary | ICD-10-CM | POA: Diagnosis not present

## 2014-11-24 DIAGNOSIS — L03116 Cellulitis of left lower limb: Secondary | ICD-10-CM | POA: Diagnosis not present

## 2014-12-04 DIAGNOSIS — M170 Bilateral primary osteoarthritis of knee: Secondary | ICD-10-CM | POA: Diagnosis not present

## 2014-12-04 DIAGNOSIS — N183 Chronic kidney disease, stage 3 (moderate): Secondary | ICD-10-CM | POA: Diagnosis not present

## 2014-12-04 DIAGNOSIS — F329 Major depressive disorder, single episode, unspecified: Secondary | ICD-10-CM | POA: Diagnosis not present

## 2014-12-04 DIAGNOSIS — L03116 Cellulitis of left lower limb: Secondary | ICD-10-CM | POA: Diagnosis not present

## 2014-12-04 DIAGNOSIS — I129 Hypertensive chronic kidney disease with stage 1 through stage 4 chronic kidney disease, or unspecified chronic kidney disease: Secondary | ICD-10-CM | POA: Diagnosis not present

## 2014-12-04 DIAGNOSIS — L03115 Cellulitis of right lower limb: Secondary | ICD-10-CM | POA: Diagnosis not present

## 2014-12-04 DIAGNOSIS — Z794 Long term (current) use of insulin: Secondary | ICD-10-CM | POA: Diagnosis not present

## 2014-12-04 DIAGNOSIS — E1140 Type 2 diabetes mellitus with diabetic neuropathy, unspecified: Secondary | ICD-10-CM | POA: Diagnosis not present

## 2014-12-06 ENCOUNTER — Emergency Department (HOSPITAL_COMMUNITY): Payer: Medicare Other

## 2014-12-06 ENCOUNTER — Emergency Department (HOSPITAL_COMMUNITY)
Admission: EM | Admit: 2014-12-06 | Discharge: 2014-12-06 | Disposition: A | Discharge status: Home / Self Care | Payer: Medicare Other | Attending: Emergency Medicine | Admitting: Emergency Medicine

## 2014-12-06 ENCOUNTER — Encounter (HOSPITAL_COMMUNITY): Payer: Self-pay

## 2014-12-06 DIAGNOSIS — Y92000 Kitchen of unspecified non-institutional (private) residence as  the place of occurrence of the external cause: Secondary | ICD-10-CM | POA: Diagnosis not present

## 2014-12-06 DIAGNOSIS — Z8619 Personal history of other infectious and parasitic diseases: Secondary | ICD-10-CM | POA: Diagnosis not present

## 2014-12-06 DIAGNOSIS — Z8669 Personal history of other diseases of the nervous system and sense organs: Secondary | ICD-10-CM | POA: Insufficient documentation

## 2014-12-06 DIAGNOSIS — M545 Low back pain: Secondary | ICD-10-CM | POA: Diagnosis not present

## 2014-12-06 DIAGNOSIS — Z862 Personal history of diseases of the blood and blood-forming organs and certain disorders involving the immune mechanism: Secondary | ICD-10-CM | POA: Diagnosis not present

## 2014-12-06 DIAGNOSIS — Z792 Long term (current) use of antibiotics: Secondary | ICD-10-CM | POA: Diagnosis not present

## 2014-12-06 DIAGNOSIS — Z794 Long term (current) use of insulin: Secondary | ICD-10-CM | POA: Insufficient documentation

## 2014-12-06 DIAGNOSIS — S0990XA Unspecified injury of head, initial encounter: Secondary | ICD-10-CM | POA: Diagnosis not present

## 2014-12-06 DIAGNOSIS — R6889 Other general symptoms and signs: Secondary | ICD-10-CM | POA: Diagnosis not present

## 2014-12-06 DIAGNOSIS — I10 Essential (primary) hypertension: Secondary | ICD-10-CM | POA: Diagnosis not present

## 2014-12-06 DIAGNOSIS — Y998 Other external cause status: Secondary | ICD-10-CM | POA: Diagnosis not present

## 2014-12-06 DIAGNOSIS — Z7982 Long term (current) use of aspirin: Secondary | ICD-10-CM | POA: Insufficient documentation

## 2014-12-06 DIAGNOSIS — W01198A Fall on same level from slipping, tripping and stumbling with subsequent striking against other object, initial encounter: Secondary | ICD-10-CM | POA: Insufficient documentation

## 2014-12-06 DIAGNOSIS — Z87438 Personal history of other diseases of male genital organs: Secondary | ICD-10-CM | POA: Insufficient documentation

## 2014-12-06 DIAGNOSIS — M549 Dorsalgia, unspecified: Secondary | ICD-10-CM | POA: Diagnosis not present

## 2014-12-06 DIAGNOSIS — Z7901 Long term (current) use of anticoagulants: Secondary | ICD-10-CM | POA: Diagnosis not present

## 2014-12-06 DIAGNOSIS — R51 Headache: Secondary | ICD-10-CM | POA: Diagnosis not present

## 2014-12-06 DIAGNOSIS — I509 Heart failure, unspecified: Secondary | ICD-10-CM | POA: Insufficient documentation

## 2014-12-06 DIAGNOSIS — Z8546 Personal history of malignant neoplasm of prostate: Secondary | ICD-10-CM | POA: Insufficient documentation

## 2014-12-06 DIAGNOSIS — S3992XA Unspecified injury of lower back, initial encounter: Secondary | ICD-10-CM | POA: Diagnosis present

## 2014-12-06 DIAGNOSIS — Y9389 Activity, other specified: Secondary | ICD-10-CM | POA: Diagnosis not present

## 2014-12-06 DIAGNOSIS — E119 Type 2 diabetes mellitus without complications: Secondary | ICD-10-CM | POA: Insufficient documentation

## 2014-12-06 DIAGNOSIS — R531 Weakness: Secondary | ICD-10-CM | POA: Insufficient documentation

## 2014-12-06 DIAGNOSIS — S20229A Contusion of unspecified back wall of thorax, initial encounter: Secondary | ICD-10-CM | POA: Diagnosis not present

## 2014-12-06 DIAGNOSIS — Z79899 Other long term (current) drug therapy: Secondary | ICD-10-CM | POA: Insufficient documentation

## 2014-12-06 DIAGNOSIS — S199XXA Unspecified injury of neck, initial encounter: Secondary | ICD-10-CM | POA: Diagnosis not present

## 2014-12-06 DIAGNOSIS — M25551 Pain in right hip: Secondary | ICD-10-CM | POA: Diagnosis not present

## 2014-12-06 DIAGNOSIS — Z8673 Personal history of transient ischemic attack (TIA), and cerebral infarction without residual deficits: Secondary | ICD-10-CM | POA: Insufficient documentation

## 2014-12-06 DIAGNOSIS — S3993XA Unspecified injury of pelvis, initial encounter: Secondary | ICD-10-CM | POA: Diagnosis not present

## 2014-12-06 DIAGNOSIS — S300XXA Contusion of lower back and pelvis, initial encounter: Secondary | ICD-10-CM | POA: Diagnosis not present

## 2014-12-06 DIAGNOSIS — W19XXXA Unspecified fall, initial encounter: Secondary | ICD-10-CM

## 2014-12-06 DIAGNOSIS — S24109A Unspecified injury at unspecified level of thoracic spinal cord, initial encounter: Secondary | ICD-10-CM | POA: Diagnosis not present

## 2014-12-06 LAB — CBC WITH DIFFERENTIAL/PLATELET
Basophils Absolute: 0 10*3/uL (ref 0.0–0.1)
Basophils Relative: 0 % (ref 0–1)
Eosinophils Absolute: 0.1 10*3/uL (ref 0.0–0.7)
Eosinophils Relative: 1 % (ref 0–5)
HCT: 41.5 % (ref 39.0–52.0)
Hemoglobin: 13.2 g/dL (ref 13.0–17.0)
LYMPHS ABS: 1.3 10*3/uL (ref 0.7–4.0)
Lymphocytes Relative: 12 % (ref 12–46)
MCH: 21.5 pg — ABNORMAL LOW (ref 26.0–34.0)
MCHC: 31.8 g/dL (ref 30.0–36.0)
MCV: 67.6 fL — ABNORMAL LOW (ref 78.0–100.0)
MONO ABS: 1.3 10*3/uL — AB (ref 0.1–1.0)
MONOS PCT: 13 % — AB (ref 3–12)
NEUTROS ABS: 7.7 10*3/uL (ref 1.7–7.7)
Neutrophils Relative %: 74 % (ref 43–77)
Platelets: 267 10*3/uL (ref 150–400)
RBC: 6.14 MIL/uL — ABNORMAL HIGH (ref 4.22–5.81)
RDW: 15 % (ref 11.5–15.5)
WBC: 10.4 10*3/uL (ref 4.0–10.5)

## 2014-12-06 LAB — URINALYSIS, ROUTINE W REFLEX MICROSCOPIC
Bilirubin Urine: NEGATIVE
GLUCOSE, UA: NEGATIVE mg/dL
Hgb urine dipstick: NEGATIVE
Ketones, ur: 40 mg/dL — AB
Leukocytes, UA: NEGATIVE
Nitrite: NEGATIVE
PROTEIN: NEGATIVE mg/dL
Specific Gravity, Urine: 1.012 (ref 1.005–1.030)
UROBILINOGEN UA: 1 mg/dL (ref 0.0–1.0)
pH: 6.5 (ref 5.0–8.0)

## 2014-12-06 LAB — COMPREHENSIVE METABOLIC PANEL
ALT: 27 U/L (ref 0–53)
AST: 55 U/L — AB (ref 0–37)
Albumin: 3.3 g/dL — ABNORMAL LOW (ref 3.5–5.2)
Alkaline Phosphatase: 66 U/L (ref 39–117)
Anion gap: 10 (ref 5–15)
BUN: 17 mg/dL (ref 6–23)
CALCIUM: 9.1 mg/dL (ref 8.4–10.5)
CO2: 27 mmol/L (ref 19–32)
Chloride: 103 mEq/L (ref 96–112)
Creatinine, Ser: 1.02 mg/dL (ref 0.50–1.35)
GFR, EST AFRICAN AMERICAN: 77 mL/min — AB (ref >90)
GFR, EST NON AFRICAN AMERICAN: 67 mL/min — AB (ref >90)
GLUCOSE: 137 mg/dL — AB (ref 70–99)
Potassium: 4.1 mmol/L (ref 3.5–5.1)
Sodium: 140 mmol/L (ref 135–145)
TOTAL PROTEIN: 6.6 g/dL (ref 6.0–8.3)
Total Bilirubin: 0.9 mg/dL (ref 0.3–1.2)

## 2014-12-06 LAB — ACETAMINOPHEN LEVEL: Acetaminophen (Tylenol), Serum: <10 ug/mL — ABNORMAL LOW (ref 10–30)

## 2014-12-06 LAB — AMMONIA: AMMONIA: 13 umol/L (ref 11–32)

## 2014-12-06 MED ORDER — HYDROCODONE-ACETAMINOPHEN 5-325 MG PO TABS: 2.0000 | ORAL_TABLET | ORAL | Status: DC | PRN

## 2014-12-06 MED ORDER — HYDROCODONE-ACETAMINOPHEN 5-325 MG PO TABS
2.0000 | ORAL_TABLET | Freq: Once | ORAL | Status: AC
  Administered 2014-12-06: 2 via ORAL
  Filled 2014-12-06: qty 2

## 2014-12-06 MED ORDER — HYDROCODONE-ACETAMINOPHEN 5-325 MG PO TABS
1.0000 | ORAL_TABLET | Freq: Once | ORAL | Status: AC
  Administered 2014-12-06: 1 via ORAL
  Filled 2014-12-06: qty 1

## 2014-12-06 NOTE — ED Provider Notes (Signed)
CSN: [id number]     Arrival date & time 12/06/14  1300 History   First MD Initiated Contact with Patient 12/06/14 1307     Chief Complaint  Patient presents with  . Tailbone Pain     (Consider location/radiation/quality/duration/timing/severity/associated sxs/prior Treatment) Patient is a 81 y.o. male presenting with fall. The history is provided by the patient. No language interpreter was used.  Fall This is a new problem. Episode onset: 2 days ago. The problem occurs constantly. The problem has been gradually worsening. Associated symptoms include neck pain and weakness. Pertinent negatives include no abdominal pain. Nothing aggravates the symptoms. He has tried nothing for the symptoms.  Pt reports he fell 2 days ago. Pt does not know how he fell.  (Pt reports he has had a stroke in the past)  Pt reports he was able to get up on his own.  Pt reports he has had severe pain since fall in his low back.  Pt reports his neck feels sore. Pt lives alone.   Past Medical History  Diagnosis Date  . Umbilical hernia   . Hypertension   . High cholesterol   . Type II diabetes mellitus   . Stroke ~ 2005    denies residual on 2/123/2015  . Prostate cancer     S/P "8 weeks of radiation"  . CHF (congestive heart failure)   . Onychomycosis   . BPH (benign prostatic hyperplasia)   . Urinary urgency     with incontinence  . Lower extremity edema   . Prostatitis     recurrent  . Sleep apnea   . Alpha thalassemia    Past Surgical History  Procedure Laterality Date  . Ventral hernia repair  12/18/2011    Procedure: HERNIA REPAIR VENTRAL ADULT;  Surgeon: Adin Hector, MD;  Location: [place];  Service: General;  Laterality: N/A;  . Hernia repair    . Vasectomy     No family history on file. History  Substance Use Topics  . Smoking status: Never Smoker   . Smokeless tobacco: Never Used  . Alcohol Use: No    Review of Systems  Gastrointestinal: Negative for abdominal pain.   Musculoskeletal: Positive for neck pain.  Neurological: Positive for weakness.      Allergies  Flomax and Glucophage  Home Medications   Prior to Admission medications   Medication Sig Start Date End Date Taking? Authorizing Provider  aspirin EC 81 MG tablet Take 81 mg by mouth daily.   Yes Historical Provider, MD  B Complex Vitamins (VITAMIN B COMPLEX PO) Take 1 tablet by mouth daily.   Yes Historical Provider, MD  clopidogrel (PLAVIX) 75 MG tablet Take 75 mg by mouth daily.   Yes Historical Provider, MD  Docusate Sodium (DSS) 100 MG CAPS Take 100 mg by mouth 2 (two) times daily. 04/08/13  Yes Josetta Huddle, MD  furosemide (LASIX) 80 MG tablet Take 40 mg by mouth daily.   Yes Historical Provider, MD  HYDROcodone-acetaminophen (NORCO) 10-325 MG per tablet Take 2 tablets by mouth every 6 (six) hours as needed for moderate pain.   Yes Historical Provider, MD  insulin glargine (LANTUS) 100 UNIT/ML injection Inject 80 Units into the skin 2 (two) times daily.    Yes Historical Provider, MD  potassium chloride SA (K-DUR,KLOR-CON) 20 MEQ tablet Take 20 mEq by mouth daily.   Yes Historical Provider, MD  pregabalin (LYRICA) 50 MG capsule Take 50 mg by mouth 2 (two) times daily.  Yes Historical Provider, MD  rifampin (RIFADIN) 300 MG capsule Take 1 capsule (300 mg total) by mouth 2 (two) times daily. 01/13/14  Yes Josetta Huddle, MD  terazosin (HYTRIN) 2 MG capsule Take 4 mg by mouth daily.   Yes Historical Provider, MD  vitamin B-12 (CYANOCOBALAMIN) 1000 MCG tablet Take 1,000 mcg by mouth daily.   Yes Historical Provider, MD  zolpidem (AMBIEN) 5 MG tablet Take 5 mg by mouth at bedtime as needed for sleep.   Yes Historical Provider, MD   BP 167/89 mmHg  Pulse 101  Temp(Src) 98.3 F (36.8 C) (Oral)  Resp 17  Ht 5\' 11"  (1.803 m)  Wt 274 lb (124.286 kg)  BMI 38.23 kg/m2  SpO2 91% Physical Exam  Constitutional: He is oriented to person, place, and time. He appears well-developed and  well-nourished.  HENT:  Head: Normocephalic.  Eyes: EOM are normal.  Neck: Normal range of motion.  Cardiovascular: Normal rate and normal heart sounds.   Pulmonary/Chest: Effort normal and breath sounds normal.  Abdominal: Soft. He exhibits no distension.  Musculoskeletal: He exhibits tenderness.  Una boots bilat lower extremities,  Diffusely tender thoracic and lumbar spine.  Neurological: He is alert and oriented to person, place, and time.  Skin: Skin is warm.  Psychiatric: He has a normal mood and affect.  Nursing note and vitals reviewed.   ED Course  Procedures (including critical care time) Labs Review Labs Reviewed  URINALYSIS, ROUTINE W REFLEX MICROSCOPIC - Abnormal; Notable for the following:    Ketones, ur 40 (*)    All other components within normal limits  CBC WITH DIFFERENTIAL - Abnormal; Notable for the following:    RBC 6.14 (*)    MCV 67.6 (*)    MCH 21.5 (*)    Monocytes Relative 13 (*)    Monocytes Absolute 1.3 (*)    All other components within normal limits  COMPREHENSIVE METABOLIC PANEL    Imaging Review Dg Thoracic Spine 2 View  12/06/2014   CLINICAL DATA:  Fall yesterday in pt's kitchen on the linoleum floor two days ago. Pt was putting his chicken and dumplings in the microwave when he fell backwards landing on his back and hitting his head. Pt c/o middle and lower back pain as well as bilateral hip pain, with most of his pain being on the right hip.  EXAM: THORACIC SPINE - 2 VIEW  COMPARISON:  Chest radiographs dated 01/11/2014.  FINDINGS: Positional dextroconvex scoliosis on the frontal view due to patient rotation to the left. Anterior spur formation at multiple levels. No fractures or subluxations. Atheromatous arterial calcifications. The cervicothoracic region is not well visualized on the swimmer's view.  IMPRESSION: 1. No fracture or subluxation seen. 2. Degenerative changes.   Electronically Signed   By: Enrique Sack M.D.   On: 12/06/2014 15:30    Dg Lumbar Spine Complete  12/06/2014   CLINICAL DATA:  Fall yesterday in pt's kitchen on the linoleum floor two days ago. Pt was putting his chicken and dumplings in the microwave when he fell backwards landing on his back and hitting his head. Pt c/o middle and lower back pain as well as bilateral hip pain, with most of his pain being on the right hip.  EXAM: LUMBAR SPINE - COMPLETE 4+ VIEW  COMPARISON:  Thoracic spine radiographs obtained at the same time. Abdomen and pelvis CT dated 12/13/2011.  FINDINGS: Five non-rib-bearing lumbar vertebrae. Stable mild levoconvex lumbar scoliosis. Disc space narrowing and anterior and lateral spur  formation at multiple levels with mild progression. Facet degenerative changes throughout the lumbar spine. Straightening of the normal lumbar lordosis. No fractures, pars defects or subluxations. Atheromatous arterial calcifications.  IMPRESSION: 1. No fracture or subluxation. 2. Mildly progressive degenerative changes. 3. Mild scoliosis.   Electronically Signed   By: Enrique Sack M.D.   On: 12/06/2014 15:32   Dg Pelvis 1-2 Views  12/06/2014   CLINICAL DATA:  Fall yesterday in pt's kitchen on the linoleum floor two days ago. Pt was putting his chicken and dumplings in the microwave when he fell backwards landing on his back and hitting his head. Pt c/o middle and lower back pain as well as bilateral hip pain, with most of his pain being on the right hip.  EXAM: PELVIS - 1-2 VIEW  COMPARISON:  Right hip MR dated 04/05/2013. Pelvis CT dated 04/04/2013.  FINDINGS: Diffuse osteopenia. No fracture or dislocation seen. Lower lumbar spine degenerative changes. Three radiation seed implants overlying the pubic region.  IMPRESSION: No fracture or dislocation.   Electronically Signed   By: Enrique Sack M.D.   On: 12/06/2014 15:34   Ct Head Wo Contrast  12/06/2014   CLINICAL DATA:  Patient fell 2 days ago and hit head C/O severe headache, dizziness and weakness Pt is slow to respond  and could not answer all of medical hx Hx of stroke 2005  EXAM: CT HEAD WITHOUT CONTRAST  CT CERVICAL SPINE WITHOUT CONTRAST  TECHNIQUE: Multidetector CT imaging of the head and cervical spine was performed following the standard protocol without intravenous contrast. Multiplanar CT image reconstructions of the cervical spine were also generated.  COMPARISON:  Head CT dated 04/22/2012 and cervical spine CT dated 12/07/2010.  FINDINGS: CT HEAD FINDINGS  Diffusely enlarged ventricles and subarachnoid spaces. Patchy white matter low density in both cerebral hemispheres. Old right external capsule lacunar infarct. No skull fracture, intracranial hemorrhage, mass lesion, CT evidence of acute infarction or paranasal sinus air-fluid levels. Mild left maxillary sinus mucosal thickening or retention cyst.  CT CERVICAL SPINE FINDINGS  Stable mild levoconvex scoliosis. Multilevel degenerative changes without significant change. No prevertebral soft tissue swelling, fractures or subluxations. Minimal bilateral carotid artery atheromatous calcifications.  IMPRESSION: 1. No acute abnormality. 2. Stable mild to moderate diffuse cerebral atrophy and moderate diffuse cerebellar atrophy. 3. Stable moderate small vessel white matter ischemic changes in both cerebral hemispheres. 4. Stable extensive cervical spine degenerative changes.   Electronically Signed   By: Enrique Sack M.D.   On: 12/06/2014 15:56   Ct Cervical Spine Wo Contrast  12/06/2014   CLINICAL DATA:  Patient fell 2 days ago and hit head C/O severe headache, dizziness and weakness Pt is slow to respond and could not answer all of medical hx Hx of stroke 2005  EXAM: CT HEAD WITHOUT CONTRAST  CT CERVICAL SPINE WITHOUT CONTRAST  TECHNIQUE: Multidetector CT imaging of the head and cervical spine was performed following the standard protocol without intravenous contrast. Multiplanar CT image reconstructions of the cervical spine were also generated.  COMPARISON:  Head CT  dated 04/22/2012 and cervical spine CT dated 12/07/2010.  FINDINGS: CT HEAD FINDINGS  Diffusely enlarged ventricles and subarachnoid spaces. Patchy white matter low density in both cerebral hemispheres. Old right external capsule lacunar infarct. No skull fracture, intracranial hemorrhage, mass lesion, CT evidence of acute infarction or paranasal sinus air-fluid levels. Mild left maxillary sinus mucosal thickening or retention cyst.  CT CERVICAL SPINE FINDINGS  Stable mild levoconvex scoliosis. Multilevel degenerative changes without  significant change. No prevertebral soft tissue swelling, fractures or subluxations. Minimal bilateral carotid artery atheromatous calcifications.  IMPRESSION: 1. No acute abnormality. 2. Stable mild to moderate diffuse cerebral atrophy and moderate diffuse cerebellar atrophy. 3. Stable moderate small vessel white matter ischemic changes in both cerebral hemispheres. 4. Stable extensive cervical spine degenerative changes.   Electronically Signed   By: Enrique Sack M.D.   On: 12/06/2014 15:56     EKG Interpretation None      MDM   Final diagnoses:  Fall  Contusion of back, unspecified laterality, initial encounter    Social worker to see here . Home health to be set up.   Social worker will discuss needs with family    Fransico Meadow, Hershal Coria 12/06/14 Graham III, MD 12/07/14 [phone]

## 2014-12-06 NOTE — ED Notes (Signed)
Jared Hall daughter in Sports coach cell [phone]

## 2014-12-06 NOTE — ED Notes (Signed)
O2 saturation dropped to 75% with a good pleth. 2L O2 applied.

## 2014-12-06 NOTE — ED Notes (Addendum)
Pt.  Lives at home alone uses a walker to ambulate.  Pt. Reports falling 2 days ago and hitting his head in his kitchen, while preparing food.  Pt. Was able to get up and called his family.  Lower back pain ,  Pt. Also reports having a headache.  Pt.; is alert and oriented to self and, place and time.  Pt. Is vague and why he fell.  Pt. Denies any chest pain.  Pt. Denies any cough or sob.  Pt. Had a stroke in 2005, pt. Denies any weakness. Paramedics reports that pt. s urine is very a dark and strong.  Pt. Took a Vicodin for pain this morning

## 2014-12-06 NOTE — Discharge Instructions (Signed)
°Contusion °A contusion is a deep bruise. Contusions are the result of an injury that caused bleeding under the skin. The contusion may turn blue, purple, or yellow. Minor injuries will give you a painless contusion, but more severe contusions may stay painful and swollen for a few weeks.  °CAUSES  °A contusion is usually caused by a blow, trauma, or direct force to an area of the body. °SYMPTOMS  °· Swelling and redness of the injured area. °· Bruising of the injured area. °· Tenderness and soreness of the injured area. °· Pain. °DIAGNOSIS  °The diagnosis can be made by taking a history and physical exam. An X-ray, CT scan, or MRI may be needed to determine if there were any associated injuries, such as fractures. °TREATMENT  °Specific treatment will depend on what area of the body was injured. In general, the best treatment for a contusion is resting, icing, elevating, and applying cold compresses to the injured area. Over-the-counter medicines may also be recommended for pain control. Ask your caregiver what the best treatment is for your contusion. °HOME CARE INSTRUCTIONS  °· Put ice on the injured area. °· Put ice in a plastic bag. °· Place a towel between your skin and the bag. °· Leave the ice on for 15-20 minutes, 3-4 times a day, or as directed by your health care provider. °· Only take over-the-counter or prescription medicines for pain, discomfort, or fever as directed by your caregiver. Your caregiver may recommend avoiding anti-inflammatory medicines (aspirin, ibuprofen, and naproxen) for 48 hours because these medicines may increase bruising. °· Rest the injured area. °· If possible, elevate the injured area to reduce swelling. °SEEK IMMEDIATE MEDICAL CARE IF:  °· You have increased bruising or swelling. °· You have pain that is getting worse. °· Your swelling or pain is not relieved with medicines. °MAKE SURE YOU:  °· Understand these instructions. °· Will watch your condition. °· Will get help  right away if you are not doing well or get worse. °Document Released: 08/16/2005 Document Revised: 11/11/2013 Document Reviewed: 09/11/2011 °ExitCare® Patient Information ©2015 [employer]. This information is not intended to replace advice given to you by your health care provider. Make sure you discuss any questions you have with your health care provider. °Fall Prevention and Home Safety °Falls cause injuries and can affect all age groups. It is possible to use preventive measures to significantly decrease the likelihood of falls. There are many simple measures which can make your home safer and prevent falls. °OUTDOORS °· Repair cracks and edges of walkways and driveways. °· Remove high doorway thresholds. °· Trim shrubbery on the main path into your home. °· Have good outside lighting. °· Clear walkways of tools, rocks, debris, and clutter. °· Check that handrails are not broken and are securely fastened. Both sides of steps should have handrails. °· Have leaves, snow, and ice cleared regularly. °· Use sand or salt on walkways during winter months. °· In the garage, clean up grease or oil spills. °BATHROOM °· Install night lights. °· Install grab bars by the toilet and in the tub and shower. °· Use non-skid mats or decals in the tub or shower. °· Place a plastic non-slip stool in the shower to sit on, if needed. °· Keep floors dry and clean up all water on the floor immediately. °· Remove soap buildup in the tub or shower on a regular basis. °· Secure bath mats with non-slip, double-sided rug tape. °· Remove throw rugs and tripping hazards   from the floors. °BEDROOMS °· Install night lights. °· Make sure a bedside light is easy to reach. °· Do not use oversized bedding. °· Keep a telephone by your bedside. °· Have a firm chair with side arms to use for getting dressed. °· Remove throw rugs and tripping hazards from the floor. °KITCHEN °· Keep handles on pots and pans turned toward the center of the stove. Use  back burners when possible. °· Clean up spills quickly and allow time for drying. °· Avoid walking on wet floors. °· Avoid hot utensils and knives. °· Position shelves so they are not too high or low. °· Place commonly used objects within easy reach. °· If necessary, use a sturdy step stool with a grab bar when reaching. °· Keep electrical cables out of the way. °· Do not use floor polish or wax that makes floors slippery. If you must use wax, use non-skid floor wax. °· Remove throw rugs and tripping hazards from the floor. °STAIRWAYS °· Never leave objects on stairs. °· Place handrails on both sides of stairways and use them. Fix any loose handrails. Make sure handrails on both sides of the stairways are as long as the stairs. °· Check carpeting to make sure it is firmly attached along stairs. Make repairs to worn or loose carpet promptly. °· Avoid placing throw rugs at the top or bottom of stairways, or properly secure the rug with carpet tape to prevent slippage. Get rid of throw rugs, if possible. °· Have an electrician put in a light switch at the top and bottom of the stairs. °OTHER FALL PREVENTION TIPS °· Wear low-heel or rubber-soled shoes that are supportive and fit well. Wear closed toe shoes. °· When using a stepladder, make sure it is fully opened and both spreaders are firmly locked. Do not climb a closed stepladder. °· Add color or contrast paint or tape to grab bars and handrails in your home. Place contrasting color strips on first and last steps. °· Learn and use mobility aids as needed. Install an electrical emergency response system. °· Turn on lights to avoid dark areas. Replace light bulbs that burn out immediately. Get light switches that glow. °· Arrange furniture to create clear pathways. Keep furniture in the same place. °· Firmly attach carpet with non-skid or double-sided tape. °· Eliminate uneven floor surfaces. °· Select a carpet pattern that does not visually hide the edge of  steps. °· Be aware of all pets. °OTHER HOME SAFETY TIPS °· Set the water temperature for 120° F (48.8° C). °· Keep emergency numbers on or near the telephone. °· Keep smoke detectors on every level of the home and near sleeping areas. °Document Released: 10/27/2002 Document Revised: 05/07/2012 Document Reviewed: 01/26/2012 °ExitCare® Patient Information ©2015 [employer]. This information is not intended to replace advice given to you by your health care provider. Make sure you discuss any questions you have with your health care provider. ° °

## 2014-12-06 NOTE — ED Notes (Signed)
PA at bedside.

## 2014-12-06 NOTE — Progress Notes (Signed)
ED CM received consult from K. Threasa Alpha PA-C concerning home health recommendations. Patient not best historian as per [facility]. Patient presented to [facility] ED s/p unwitnessed fall at home. Contacted son and daughter-in-law Olivia Royse by phone to obtain information and discuss recommendations for [facility] services RN/PT/SW/HHA. Offered choice, [facility] HH agency was selected. Confirmed DME equipment not needed, patient has rolling walker at home. Verified information for where services will be rendered. Referral faxed to [facility] at [phone] fax confirmation received.  Explained that a rep from [facility] will contact patient and family  in 24-48 hours to set up the initial assessment visit. Patient and family agreeable with discharge plan. K. Sofia PA-C updated on disposition plan, she is agreeable. No further CM needs identified

## 2014-12-07 DIAGNOSIS — L03116 Cellulitis of left lower limb: Secondary | ICD-10-CM | POA: Diagnosis not present

## 2014-12-07 DIAGNOSIS — E1140 Type 2 diabetes mellitus with diabetic neuropathy, unspecified: Secondary | ICD-10-CM | POA: Diagnosis not present

## 2014-12-07 DIAGNOSIS — E1165 Type 2 diabetes mellitus with hyperglycemia: Secondary | ICD-10-CM | POA: Diagnosis not present

## 2014-12-07 DIAGNOSIS — M179 Osteoarthritis of knee, unspecified: Secondary | ICD-10-CM | POA: Diagnosis not present

## 2014-12-07 DIAGNOSIS — R55 Syncope and collapse: Secondary | ICD-10-CM | POA: Diagnosis not present

## 2014-12-07 DIAGNOSIS — L03115 Cellulitis of right lower limb: Secondary | ICD-10-CM | POA: Diagnosis not present

## 2014-12-07 NOTE — ED Provider Notes (Signed)
Taken in sign out from [place]. This is a gentleman who came in with altered mental status and fall, which occurred last week. He is being set up for home health care currently. We are also awaiting his family to come up from another city. The patient is resting comfortably at this time.   Patient reports to the nurse that he is taking 2 Norco 325 every 2 hours. I discussed with the patient who seems confused about his medicines scheduled at this could certainly damaged his liver and also contribute to confusion and falls at home. We'll obtain an ammonia level and a Tylenol level. I informed the patient and his daughter-in-law of our plan. They agree with plan of care. Patient offered food during his wait time.   The patient has no elevated Tylenol level and ammonia level is normal. He has been set up with home health care. I have advised the family that he will need their support. Considering his confusion and the fact that he lives alone. It seems that he may need increased supervision, especially concerning his medications. The daughter agrees with plan of care. Vision appears safe for discharge at this time.  Margarita Mail, PA-C 12/07/14 1643  Blanchie Dessert, MD 12/09/14 1714

## 2014-12-08 DIAGNOSIS — N183 Chronic kidney disease, stage 3 (moderate): Secondary | ICD-10-CM | POA: Diagnosis not present

## 2014-12-08 DIAGNOSIS — M170 Bilateral primary osteoarthritis of knee: Secondary | ICD-10-CM | POA: Diagnosis not present

## 2014-12-08 DIAGNOSIS — E1140 Type 2 diabetes mellitus with diabetic neuropathy, unspecified: Secondary | ICD-10-CM | POA: Diagnosis not present

## 2014-12-08 DIAGNOSIS — L03115 Cellulitis of right lower limb: Secondary | ICD-10-CM | POA: Diagnosis not present

## 2014-12-08 DIAGNOSIS — L03116 Cellulitis of left lower limb: Secondary | ICD-10-CM | POA: Diagnosis not present

## 2014-12-08 DIAGNOSIS — I129 Hypertensive chronic kidney disease with stage 1 through stage 4 chronic kidney disease, or unspecified chronic kidney disease: Secondary | ICD-10-CM | POA: Diagnosis not present

## 2014-12-10 DIAGNOSIS — E1140 Type 2 diabetes mellitus with diabetic neuropathy, unspecified: Secondary | ICD-10-CM | POA: Diagnosis not present

## 2014-12-10 DIAGNOSIS — L03116 Cellulitis of left lower limb: Secondary | ICD-10-CM | POA: Diagnosis not present

## 2014-12-10 DIAGNOSIS — N183 Chronic kidney disease, stage 3 (moderate): Secondary | ICD-10-CM | POA: Diagnosis not present

## 2014-12-10 DIAGNOSIS — I129 Hypertensive chronic kidney disease with stage 1 through stage 4 chronic kidney disease, or unspecified chronic kidney disease: Secondary | ICD-10-CM | POA: Diagnosis not present

## 2014-12-10 DIAGNOSIS — L03115 Cellulitis of right lower limb: Secondary | ICD-10-CM | POA: Diagnosis not present

## 2014-12-10 DIAGNOSIS — M170 Bilateral primary osteoarthritis of knee: Secondary | ICD-10-CM | POA: Diagnosis not present

## 2014-12-16 DIAGNOSIS — I129 Hypertensive chronic kidney disease with stage 1 through stage 4 chronic kidney disease, or unspecified chronic kidney disease: Secondary | ICD-10-CM | POA: Diagnosis not present

## 2014-12-16 DIAGNOSIS — L03116 Cellulitis of left lower limb: Secondary | ICD-10-CM | POA: Diagnosis not present

## 2014-12-16 DIAGNOSIS — N183 Chronic kidney disease, stage 3 (moderate): Secondary | ICD-10-CM | POA: Diagnosis not present

## 2014-12-16 DIAGNOSIS — M170 Bilateral primary osteoarthritis of knee: Secondary | ICD-10-CM | POA: Diagnosis not present

## 2014-12-16 DIAGNOSIS — L03115 Cellulitis of right lower limb: Secondary | ICD-10-CM | POA: Diagnosis not present

## 2014-12-16 DIAGNOSIS — E1140 Type 2 diabetes mellitus with diabetic neuropathy, unspecified: Secondary | ICD-10-CM | POA: Diagnosis not present

## 2014-12-17 DIAGNOSIS — I129 Hypertensive chronic kidney disease with stage 1 through stage 4 chronic kidney disease, or unspecified chronic kidney disease: Secondary | ICD-10-CM | POA: Diagnosis not present

## 2014-12-17 DIAGNOSIS — E1140 Type 2 diabetes mellitus with diabetic neuropathy, unspecified: Secondary | ICD-10-CM | POA: Diagnosis not present

## 2014-12-17 DIAGNOSIS — L03115 Cellulitis of right lower limb: Secondary | ICD-10-CM | POA: Diagnosis not present

## 2014-12-17 DIAGNOSIS — L03116 Cellulitis of left lower limb: Secondary | ICD-10-CM | POA: Diagnosis not present

## 2014-12-17 DIAGNOSIS — M170 Bilateral primary osteoarthritis of knee: Secondary | ICD-10-CM | POA: Diagnosis not present

## 2014-12-17 DIAGNOSIS — N183 Chronic kidney disease, stage 3 (moderate): Secondary | ICD-10-CM | POA: Diagnosis not present

## 2014-12-18 DIAGNOSIS — L03115 Cellulitis of right lower limb: Secondary | ICD-10-CM | POA: Diagnosis not present

## 2014-12-18 DIAGNOSIS — N183 Chronic kidney disease, stage 3 (moderate): Secondary | ICD-10-CM | POA: Diagnosis not present

## 2014-12-18 DIAGNOSIS — E1140 Type 2 diabetes mellitus with diabetic neuropathy, unspecified: Secondary | ICD-10-CM | POA: Diagnosis not present

## 2014-12-18 DIAGNOSIS — I129 Hypertensive chronic kidney disease with stage 1 through stage 4 chronic kidney disease, or unspecified chronic kidney disease: Secondary | ICD-10-CM | POA: Diagnosis not present

## 2014-12-18 DIAGNOSIS — L03116 Cellulitis of left lower limb: Secondary | ICD-10-CM | POA: Diagnosis not present

## 2014-12-18 DIAGNOSIS — M170 Bilateral primary osteoarthritis of knee: Secondary | ICD-10-CM | POA: Diagnosis not present

## 2014-12-21 DIAGNOSIS — L03116 Cellulitis of left lower limb: Secondary | ICD-10-CM | POA: Diagnosis not present

## 2014-12-21 DIAGNOSIS — M170 Bilateral primary osteoarthritis of knee: Secondary | ICD-10-CM | POA: Diagnosis not present

## 2014-12-21 DIAGNOSIS — N183 Chronic kidney disease, stage 3 (moderate): Secondary | ICD-10-CM | POA: Diagnosis not present

## 2014-12-21 DIAGNOSIS — E1140 Type 2 diabetes mellitus with diabetic neuropathy, unspecified: Secondary | ICD-10-CM | POA: Diagnosis not present

## 2014-12-21 DIAGNOSIS — L03115 Cellulitis of right lower limb: Secondary | ICD-10-CM | POA: Diagnosis not present

## 2014-12-21 DIAGNOSIS — I129 Hypertensive chronic kidney disease with stage 1 through stage 4 chronic kidney disease, or unspecified chronic kidney disease: Secondary | ICD-10-CM | POA: Diagnosis not present

## 2014-12-22 DIAGNOSIS — L03116 Cellulitis of left lower limb: Secondary | ICD-10-CM | POA: Diagnosis not present

## 2014-12-22 DIAGNOSIS — N183 Chronic kidney disease, stage 3 (moderate): Secondary | ICD-10-CM | POA: Diagnosis not present

## 2014-12-22 DIAGNOSIS — I129 Hypertensive chronic kidney disease with stage 1 through stage 4 chronic kidney disease, or unspecified chronic kidney disease: Secondary | ICD-10-CM | POA: Diagnosis not present

## 2014-12-22 DIAGNOSIS — L03115 Cellulitis of right lower limb: Secondary | ICD-10-CM | POA: Diagnosis not present

## 2014-12-22 DIAGNOSIS — M170 Bilateral primary osteoarthritis of knee: Secondary | ICD-10-CM | POA: Diagnosis not present

## 2014-12-22 DIAGNOSIS — E1140 Type 2 diabetes mellitus with diabetic neuropathy, unspecified: Secondary | ICD-10-CM | POA: Diagnosis not present

## 2014-12-23 DIAGNOSIS — L03115 Cellulitis of right lower limb: Secondary | ICD-10-CM | POA: Diagnosis not present

## 2014-12-23 DIAGNOSIS — I129 Hypertensive chronic kidney disease with stage 1 through stage 4 chronic kidney disease, or unspecified chronic kidney disease: Secondary | ICD-10-CM | POA: Diagnosis not present

## 2014-12-23 DIAGNOSIS — L03116 Cellulitis of left lower limb: Secondary | ICD-10-CM | POA: Diagnosis not present

## 2014-12-23 DIAGNOSIS — M170 Bilateral primary osteoarthritis of knee: Secondary | ICD-10-CM | POA: Diagnosis not present

## 2014-12-23 DIAGNOSIS — E1140 Type 2 diabetes mellitus with diabetic neuropathy, unspecified: Secondary | ICD-10-CM | POA: Diagnosis not present

## 2014-12-23 DIAGNOSIS — N183 Chronic kidney disease, stage 3 (moderate): Secondary | ICD-10-CM | POA: Diagnosis not present

## 2014-12-24 DIAGNOSIS — E1140 Type 2 diabetes mellitus with diabetic neuropathy, unspecified: Secondary | ICD-10-CM | POA: Diagnosis not present

## 2014-12-24 DIAGNOSIS — L03116 Cellulitis of left lower limb: Secondary | ICD-10-CM | POA: Diagnosis not present

## 2014-12-24 DIAGNOSIS — N183 Chronic kidney disease, stage 3 (moderate): Secondary | ICD-10-CM | POA: Diagnosis not present

## 2014-12-24 DIAGNOSIS — I129 Hypertensive chronic kidney disease with stage 1 through stage 4 chronic kidney disease, or unspecified chronic kidney disease: Secondary | ICD-10-CM | POA: Diagnosis not present

## 2014-12-24 DIAGNOSIS — M170 Bilateral primary osteoarthritis of knee: Secondary | ICD-10-CM | POA: Diagnosis not present

## 2014-12-24 DIAGNOSIS — L03115 Cellulitis of right lower limb: Secondary | ICD-10-CM | POA: Diagnosis not present

## 2014-12-25 DIAGNOSIS — L03115 Cellulitis of right lower limb: Secondary | ICD-10-CM | POA: Diagnosis not present

## 2014-12-25 DIAGNOSIS — M170 Bilateral primary osteoarthritis of knee: Secondary | ICD-10-CM | POA: Diagnosis not present

## 2014-12-25 DIAGNOSIS — I129 Hypertensive chronic kidney disease with stage 1 through stage 4 chronic kidney disease, or unspecified chronic kidney disease: Secondary | ICD-10-CM | POA: Diagnosis not present

## 2014-12-25 DIAGNOSIS — E1140 Type 2 diabetes mellitus with diabetic neuropathy, unspecified: Secondary | ICD-10-CM | POA: Diagnosis not present

## 2014-12-25 DIAGNOSIS — N183 Chronic kidney disease, stage 3 (moderate): Secondary | ICD-10-CM | POA: Diagnosis not present

## 2014-12-25 DIAGNOSIS — L03116 Cellulitis of left lower limb: Secondary | ICD-10-CM | POA: Diagnosis not present

## 2014-12-28 DIAGNOSIS — L03116 Cellulitis of left lower limb: Secondary | ICD-10-CM | POA: Diagnosis not present

## 2014-12-28 DIAGNOSIS — N183 Chronic kidney disease, stage 3 (moderate): Secondary | ICD-10-CM | POA: Diagnosis not present

## 2014-12-28 DIAGNOSIS — M170 Bilateral primary osteoarthritis of knee: Secondary | ICD-10-CM | POA: Diagnosis not present

## 2014-12-28 DIAGNOSIS — L03115 Cellulitis of right lower limb: Secondary | ICD-10-CM | POA: Diagnosis not present

## 2014-12-28 DIAGNOSIS — E1140 Type 2 diabetes mellitus with diabetic neuropathy, unspecified: Secondary | ICD-10-CM | POA: Diagnosis not present

## 2014-12-28 DIAGNOSIS — I129 Hypertensive chronic kidney disease with stage 1 through stage 4 chronic kidney disease, or unspecified chronic kidney disease: Secondary | ICD-10-CM | POA: Diagnosis not present

## 2014-12-29 DIAGNOSIS — I129 Hypertensive chronic kidney disease with stage 1 through stage 4 chronic kidney disease, or unspecified chronic kidney disease: Secondary | ICD-10-CM | POA: Diagnosis not present

## 2014-12-29 DIAGNOSIS — L03115 Cellulitis of right lower limb: Secondary | ICD-10-CM | POA: Diagnosis not present

## 2014-12-29 DIAGNOSIS — N183 Chronic kidney disease, stage 3 (moderate): Secondary | ICD-10-CM | POA: Diagnosis not present

## 2014-12-29 DIAGNOSIS — E1140 Type 2 diabetes mellitus with diabetic neuropathy, unspecified: Secondary | ICD-10-CM | POA: Diagnosis not present

## 2014-12-29 DIAGNOSIS — M170 Bilateral primary osteoarthritis of knee: Secondary | ICD-10-CM | POA: Diagnosis not present

## 2014-12-29 DIAGNOSIS — L03116 Cellulitis of left lower limb: Secondary | ICD-10-CM | POA: Diagnosis not present

## 2014-12-30 DIAGNOSIS — L03116 Cellulitis of left lower limb: Secondary | ICD-10-CM | POA: Diagnosis not present

## 2014-12-30 DIAGNOSIS — I129 Hypertensive chronic kidney disease with stage 1 through stage 4 chronic kidney disease, or unspecified chronic kidney disease: Secondary | ICD-10-CM | POA: Diagnosis not present

## 2014-12-30 DIAGNOSIS — M170 Bilateral primary osteoarthritis of knee: Secondary | ICD-10-CM | POA: Diagnosis not present

## 2014-12-30 DIAGNOSIS — E1140 Type 2 diabetes mellitus with diabetic neuropathy, unspecified: Secondary | ICD-10-CM | POA: Diagnosis not present

## 2014-12-30 DIAGNOSIS — N183 Chronic kidney disease, stage 3 (moderate): Secondary | ICD-10-CM | POA: Diagnosis not present

## 2014-12-30 DIAGNOSIS — L03115 Cellulitis of right lower limb: Secondary | ICD-10-CM | POA: Diagnosis not present

## 2014-12-31 DIAGNOSIS — M170 Bilateral primary osteoarthritis of knee: Secondary | ICD-10-CM | POA: Diagnosis not present

## 2014-12-31 DIAGNOSIS — N183 Chronic kidney disease, stage 3 (moderate): Secondary | ICD-10-CM | POA: Diagnosis not present

## 2014-12-31 DIAGNOSIS — L03115 Cellulitis of right lower limb: Secondary | ICD-10-CM | POA: Diagnosis not present

## 2014-12-31 DIAGNOSIS — E1140 Type 2 diabetes mellitus with diabetic neuropathy, unspecified: Secondary | ICD-10-CM | POA: Diagnosis not present

## 2014-12-31 DIAGNOSIS — L03116 Cellulitis of left lower limb: Secondary | ICD-10-CM | POA: Diagnosis not present

## 2014-12-31 DIAGNOSIS — I129 Hypertensive chronic kidney disease with stage 1 through stage 4 chronic kidney disease, or unspecified chronic kidney disease: Secondary | ICD-10-CM | POA: Diagnosis not present

## 2015-01-01 DIAGNOSIS — L03115 Cellulitis of right lower limb: Secondary | ICD-10-CM | POA: Diagnosis not present

## 2015-01-01 DIAGNOSIS — N183 Chronic kidney disease, stage 3 (moderate): Secondary | ICD-10-CM | POA: Diagnosis not present

## 2015-01-01 DIAGNOSIS — L03116 Cellulitis of left lower limb: Secondary | ICD-10-CM | POA: Diagnosis not present

## 2015-01-01 DIAGNOSIS — M170 Bilateral primary osteoarthritis of knee: Secondary | ICD-10-CM | POA: Diagnosis not present

## 2015-01-01 DIAGNOSIS — I129 Hypertensive chronic kidney disease with stage 1 through stage 4 chronic kidney disease, or unspecified chronic kidney disease: Secondary | ICD-10-CM | POA: Diagnosis not present

## 2015-01-01 DIAGNOSIS — E1140 Type 2 diabetes mellitus with diabetic neuropathy, unspecified: Secondary | ICD-10-CM | POA: Diagnosis not present

## 2015-01-04 DIAGNOSIS — N183 Chronic kidney disease, stage 3 (moderate): Secondary | ICD-10-CM | POA: Diagnosis not present

## 2015-01-04 DIAGNOSIS — L03116 Cellulitis of left lower limb: Secondary | ICD-10-CM | POA: Diagnosis not present

## 2015-01-04 DIAGNOSIS — E1140 Type 2 diabetes mellitus with diabetic neuropathy, unspecified: Secondary | ICD-10-CM | POA: Diagnosis not present

## 2015-01-04 DIAGNOSIS — L03115 Cellulitis of right lower limb: Secondary | ICD-10-CM | POA: Diagnosis not present

## 2015-01-04 DIAGNOSIS — M170 Bilateral primary osteoarthritis of knee: Secondary | ICD-10-CM | POA: Diagnosis not present

## 2015-01-04 DIAGNOSIS — I129 Hypertensive chronic kidney disease with stage 1 through stage 4 chronic kidney disease, or unspecified chronic kidney disease: Secondary | ICD-10-CM | POA: Diagnosis not present

## 2015-01-06 DIAGNOSIS — L03116 Cellulitis of left lower limb: Secondary | ICD-10-CM | POA: Diagnosis not present

## 2015-01-06 DIAGNOSIS — M170 Bilateral primary osteoarthritis of knee: Secondary | ICD-10-CM | POA: Diagnosis not present

## 2015-01-06 DIAGNOSIS — I129 Hypertensive chronic kidney disease with stage 1 through stage 4 chronic kidney disease, or unspecified chronic kidney disease: Secondary | ICD-10-CM | POA: Diagnosis not present

## 2015-01-06 DIAGNOSIS — L03115 Cellulitis of right lower limb: Secondary | ICD-10-CM | POA: Diagnosis not present

## 2015-01-06 DIAGNOSIS — N183 Chronic kidney disease, stage 3 (moderate): Secondary | ICD-10-CM | POA: Diagnosis not present

## 2015-01-06 DIAGNOSIS — E1140 Type 2 diabetes mellitus with diabetic neuropathy, unspecified: Secondary | ICD-10-CM | POA: Diagnosis not present

## 2015-01-07 DIAGNOSIS — M170 Bilateral primary osteoarthritis of knee: Secondary | ICD-10-CM | POA: Diagnosis not present

## 2015-01-07 DIAGNOSIS — I129 Hypertensive chronic kidney disease with stage 1 through stage 4 chronic kidney disease, or unspecified chronic kidney disease: Secondary | ICD-10-CM | POA: Diagnosis not present

## 2015-01-07 DIAGNOSIS — L03115 Cellulitis of right lower limb: Secondary | ICD-10-CM | POA: Diagnosis not present

## 2015-01-07 DIAGNOSIS — N183 Chronic kidney disease, stage 3 (moderate): Secondary | ICD-10-CM | POA: Diagnosis not present

## 2015-01-07 DIAGNOSIS — E1140 Type 2 diabetes mellitus with diabetic neuropathy, unspecified: Secondary | ICD-10-CM | POA: Diagnosis not present

## 2015-01-07 DIAGNOSIS — L03116 Cellulitis of left lower limb: Secondary | ICD-10-CM | POA: Diagnosis not present

## 2015-01-08 DIAGNOSIS — E1140 Type 2 diabetes mellitus with diabetic neuropathy, unspecified: Secondary | ICD-10-CM | POA: Diagnosis not present

## 2015-01-08 DIAGNOSIS — L03115 Cellulitis of right lower limb: Secondary | ICD-10-CM | POA: Diagnosis not present

## 2015-01-08 DIAGNOSIS — M170 Bilateral primary osteoarthritis of knee: Secondary | ICD-10-CM | POA: Diagnosis not present

## 2015-01-08 DIAGNOSIS — L03116 Cellulitis of left lower limb: Secondary | ICD-10-CM | POA: Diagnosis not present

## 2015-01-08 DIAGNOSIS — I129 Hypertensive chronic kidney disease with stage 1 through stage 4 chronic kidney disease, or unspecified chronic kidney disease: Secondary | ICD-10-CM | POA: Diagnosis not present

## 2015-01-08 DIAGNOSIS — N183 Chronic kidney disease, stage 3 (moderate): Secondary | ICD-10-CM | POA: Diagnosis not present

## 2015-01-11 DIAGNOSIS — I129 Hypertensive chronic kidney disease with stage 1 through stage 4 chronic kidney disease, or unspecified chronic kidney disease: Secondary | ICD-10-CM | POA: Diagnosis not present

## 2015-01-11 DIAGNOSIS — E1140 Type 2 diabetes mellitus with diabetic neuropathy, unspecified: Secondary | ICD-10-CM | POA: Diagnosis not present

## 2015-01-11 DIAGNOSIS — L03115 Cellulitis of right lower limb: Secondary | ICD-10-CM | POA: Diagnosis not present

## 2015-01-11 DIAGNOSIS — N183 Chronic kidney disease, stage 3 (moderate): Secondary | ICD-10-CM | POA: Diagnosis not present

## 2015-01-11 DIAGNOSIS — L03116 Cellulitis of left lower limb: Secondary | ICD-10-CM | POA: Diagnosis not present

## 2015-01-11 DIAGNOSIS — M170 Bilateral primary osteoarthritis of knee: Secondary | ICD-10-CM | POA: Diagnosis not present

## 2015-01-13 DIAGNOSIS — I129 Hypertensive chronic kidney disease with stage 1 through stage 4 chronic kidney disease, or unspecified chronic kidney disease: Secondary | ICD-10-CM | POA: Diagnosis not present

## 2015-01-13 DIAGNOSIS — E1140 Type 2 diabetes mellitus with diabetic neuropathy, unspecified: Secondary | ICD-10-CM | POA: Diagnosis not present

## 2015-01-13 DIAGNOSIS — L03116 Cellulitis of left lower limb: Secondary | ICD-10-CM | POA: Diagnosis not present

## 2015-01-13 DIAGNOSIS — N183 Chronic kidney disease, stage 3 (moderate): Secondary | ICD-10-CM | POA: Diagnosis not present

## 2015-01-13 DIAGNOSIS — L03115 Cellulitis of right lower limb: Secondary | ICD-10-CM | POA: Diagnosis not present

## 2015-01-13 DIAGNOSIS — M170 Bilateral primary osteoarthritis of knee: Secondary | ICD-10-CM | POA: Diagnosis not present

## 2015-01-14 DIAGNOSIS — I129 Hypertensive chronic kidney disease with stage 1 through stage 4 chronic kidney disease, or unspecified chronic kidney disease: Secondary | ICD-10-CM | POA: Diagnosis not present

## 2015-01-14 DIAGNOSIS — E1140 Type 2 diabetes mellitus with diabetic neuropathy, unspecified: Secondary | ICD-10-CM | POA: Diagnosis not present

## 2015-01-14 DIAGNOSIS — N183 Chronic kidney disease, stage 3 (moderate): Secondary | ICD-10-CM | POA: Diagnosis not present

## 2015-01-14 DIAGNOSIS — L03116 Cellulitis of left lower limb: Secondary | ICD-10-CM | POA: Diagnosis not present

## 2015-01-14 DIAGNOSIS — L03115 Cellulitis of right lower limb: Secondary | ICD-10-CM | POA: Diagnosis not present

## 2015-01-14 DIAGNOSIS — M170 Bilateral primary osteoarthritis of knee: Secondary | ICD-10-CM | POA: Diagnosis not present

## 2015-01-15 DIAGNOSIS — I129 Hypertensive chronic kidney disease with stage 1 through stage 4 chronic kidney disease, or unspecified chronic kidney disease: Secondary | ICD-10-CM | POA: Diagnosis not present

## 2015-01-15 DIAGNOSIS — N183 Chronic kidney disease, stage 3 (moderate): Secondary | ICD-10-CM | POA: Diagnosis not present

## 2015-01-15 DIAGNOSIS — M170 Bilateral primary osteoarthritis of knee: Secondary | ICD-10-CM | POA: Diagnosis not present

## 2015-01-15 DIAGNOSIS — E1140 Type 2 diabetes mellitus with diabetic neuropathy, unspecified: Secondary | ICD-10-CM | POA: Diagnosis not present

## 2015-01-15 DIAGNOSIS — L03116 Cellulitis of left lower limb: Secondary | ICD-10-CM | POA: Diagnosis not present

## 2015-01-15 DIAGNOSIS — L03115 Cellulitis of right lower limb: Secondary | ICD-10-CM | POA: Diagnosis not present

## 2015-01-19 DIAGNOSIS — L03115 Cellulitis of right lower limb: Secondary | ICD-10-CM | POA: Diagnosis not present

## 2015-01-19 DIAGNOSIS — M170 Bilateral primary osteoarthritis of knee: Secondary | ICD-10-CM | POA: Diagnosis not present

## 2015-01-19 DIAGNOSIS — L03116 Cellulitis of left lower limb: Secondary | ICD-10-CM | POA: Diagnosis not present

## 2015-01-19 DIAGNOSIS — N183 Chronic kidney disease, stage 3 (moderate): Secondary | ICD-10-CM | POA: Diagnosis not present

## 2015-01-19 DIAGNOSIS — I129 Hypertensive chronic kidney disease with stage 1 through stage 4 chronic kidney disease, or unspecified chronic kidney disease: Secondary | ICD-10-CM | POA: Diagnosis not present

## 2015-01-19 DIAGNOSIS — E1140 Type 2 diabetes mellitus with diabetic neuropathy, unspecified: Secondary | ICD-10-CM | POA: Diagnosis not present

## 2015-01-21 DIAGNOSIS — L03115 Cellulitis of right lower limb: Secondary | ICD-10-CM | POA: Diagnosis not present

## 2015-01-21 DIAGNOSIS — N183 Chronic kidney disease, stage 3 (moderate): Secondary | ICD-10-CM | POA: Diagnosis not present

## 2015-01-21 DIAGNOSIS — M170 Bilateral primary osteoarthritis of knee: Secondary | ICD-10-CM | POA: Diagnosis not present

## 2015-01-21 DIAGNOSIS — I129 Hypertensive chronic kidney disease with stage 1 through stage 4 chronic kidney disease, or unspecified chronic kidney disease: Secondary | ICD-10-CM | POA: Diagnosis not present

## 2015-01-21 DIAGNOSIS — E1140 Type 2 diabetes mellitus with diabetic neuropathy, unspecified: Secondary | ICD-10-CM | POA: Diagnosis not present

## 2015-01-21 DIAGNOSIS — L03116 Cellulitis of left lower limb: Secondary | ICD-10-CM | POA: Diagnosis not present

## 2015-01-22 DIAGNOSIS — L03116 Cellulitis of left lower limb: Secondary | ICD-10-CM | POA: Diagnosis not present

## 2015-01-22 DIAGNOSIS — E1140 Type 2 diabetes mellitus with diabetic neuropathy, unspecified: Secondary | ICD-10-CM | POA: Diagnosis not present

## 2015-01-22 DIAGNOSIS — N183 Chronic kidney disease, stage 3 (moderate): Secondary | ICD-10-CM | POA: Diagnosis not present

## 2015-01-22 DIAGNOSIS — I129 Hypertensive chronic kidney disease with stage 1 through stage 4 chronic kidney disease, or unspecified chronic kidney disease: Secondary | ICD-10-CM | POA: Diagnosis not present

## 2015-01-22 DIAGNOSIS — M170 Bilateral primary osteoarthritis of knee: Secondary | ICD-10-CM | POA: Diagnosis not present

## 2015-01-22 DIAGNOSIS — L03115 Cellulitis of right lower limb: Secondary | ICD-10-CM | POA: Diagnosis not present

## 2015-01-28 DIAGNOSIS — I129 Hypertensive chronic kidney disease with stage 1 through stage 4 chronic kidney disease, or unspecified chronic kidney disease: Secondary | ICD-10-CM | POA: Diagnosis not present

## 2015-01-28 DIAGNOSIS — L03116 Cellulitis of left lower limb: Secondary | ICD-10-CM | POA: Diagnosis not present

## 2015-01-28 DIAGNOSIS — M170 Bilateral primary osteoarthritis of knee: Secondary | ICD-10-CM | POA: Diagnosis not present

## 2015-01-28 DIAGNOSIS — E1140 Type 2 diabetes mellitus with diabetic neuropathy, unspecified: Secondary | ICD-10-CM | POA: Diagnosis not present

## 2015-01-28 DIAGNOSIS — L03115 Cellulitis of right lower limb: Secondary | ICD-10-CM | POA: Diagnosis not present

## 2015-01-28 DIAGNOSIS — N183 Chronic kidney disease, stage 3 (moderate): Secondary | ICD-10-CM | POA: Diagnosis not present

## 2015-02-02 DIAGNOSIS — E1140 Type 2 diabetes mellitus with diabetic neuropathy, unspecified: Secondary | ICD-10-CM | POA: Diagnosis not present

## 2015-02-02 DIAGNOSIS — F329 Major depressive disorder, single episode, unspecified: Secondary | ICD-10-CM | POA: Diagnosis not present

## 2015-02-02 DIAGNOSIS — L03116 Cellulitis of left lower limb: Secondary | ICD-10-CM | POA: Diagnosis not present

## 2015-02-02 DIAGNOSIS — N183 Chronic kidney disease, stage 3 (moderate): Secondary | ICD-10-CM | POA: Diagnosis not present

## 2015-02-02 DIAGNOSIS — Z794 Long term (current) use of insulin: Secondary | ICD-10-CM | POA: Diagnosis not present

## 2015-02-02 DIAGNOSIS — I129 Hypertensive chronic kidney disease with stage 1 through stage 4 chronic kidney disease, or unspecified chronic kidney disease: Secondary | ICD-10-CM | POA: Diagnosis not present

## 2015-02-02 DIAGNOSIS — M170 Bilateral primary osteoarthritis of knee: Secondary | ICD-10-CM | POA: Diagnosis not present

## 2015-02-02 DIAGNOSIS — L03115 Cellulitis of right lower limb: Secondary | ICD-10-CM | POA: Diagnosis not present

## 2015-02-03 DIAGNOSIS — E1140 Type 2 diabetes mellitus with diabetic neuropathy, unspecified: Secondary | ICD-10-CM | POA: Diagnosis not present

## 2015-02-03 DIAGNOSIS — L03115 Cellulitis of right lower limb: Secondary | ICD-10-CM | POA: Diagnosis not present

## 2015-02-03 DIAGNOSIS — N183 Chronic kidney disease, stage 3 (moderate): Secondary | ICD-10-CM | POA: Diagnosis not present

## 2015-02-03 DIAGNOSIS — M170 Bilateral primary osteoarthritis of knee: Secondary | ICD-10-CM | POA: Diagnosis not present

## 2015-02-03 DIAGNOSIS — I129 Hypertensive chronic kidney disease with stage 1 through stage 4 chronic kidney disease, or unspecified chronic kidney disease: Secondary | ICD-10-CM | POA: Diagnosis not present

## 2015-02-03 DIAGNOSIS — L03116 Cellulitis of left lower limb: Secondary | ICD-10-CM | POA: Diagnosis not present

## 2015-02-04 DIAGNOSIS — L03116 Cellulitis of left lower limb: Secondary | ICD-10-CM | POA: Diagnosis not present

## 2015-02-04 DIAGNOSIS — I129 Hypertensive chronic kidney disease with stage 1 through stage 4 chronic kidney disease, or unspecified chronic kidney disease: Secondary | ICD-10-CM | POA: Diagnosis not present

## 2015-02-04 DIAGNOSIS — L03115 Cellulitis of right lower limb: Secondary | ICD-10-CM | POA: Diagnosis not present

## 2015-02-04 DIAGNOSIS — N183 Chronic kidney disease, stage 3 (moderate): Secondary | ICD-10-CM | POA: Diagnosis not present

## 2015-02-04 DIAGNOSIS — E1140 Type 2 diabetes mellitus with diabetic neuropathy, unspecified: Secondary | ICD-10-CM | POA: Diagnosis not present

## 2015-02-04 DIAGNOSIS — M170 Bilateral primary osteoarthritis of knee: Secondary | ICD-10-CM | POA: Diagnosis not present

## 2015-02-08 DIAGNOSIS — L03115 Cellulitis of right lower limb: Secondary | ICD-10-CM | POA: Diagnosis not present

## 2015-02-08 DIAGNOSIS — E1140 Type 2 diabetes mellitus with diabetic neuropathy, unspecified: Secondary | ICD-10-CM | POA: Diagnosis not present

## 2015-02-08 DIAGNOSIS — N183 Chronic kidney disease, stage 3 (moderate): Secondary | ICD-10-CM | POA: Diagnosis not present

## 2015-02-08 DIAGNOSIS — L03116 Cellulitis of left lower limb: Secondary | ICD-10-CM | POA: Diagnosis not present

## 2015-02-08 DIAGNOSIS — I129 Hypertensive chronic kidney disease with stage 1 through stage 4 chronic kidney disease, or unspecified chronic kidney disease: Secondary | ICD-10-CM | POA: Diagnosis not present

## 2015-02-08 DIAGNOSIS — M170 Bilateral primary osteoarthritis of knee: Secondary | ICD-10-CM | POA: Diagnosis not present

## 2015-02-10 DIAGNOSIS — L03115 Cellulitis of right lower limb: Secondary | ICD-10-CM | POA: Diagnosis not present

## 2015-02-10 DIAGNOSIS — E1140 Type 2 diabetes mellitus with diabetic neuropathy, unspecified: Secondary | ICD-10-CM | POA: Diagnosis not present

## 2015-02-10 DIAGNOSIS — L03116 Cellulitis of left lower limb: Secondary | ICD-10-CM | POA: Diagnosis not present

## 2015-02-10 DIAGNOSIS — M170 Bilateral primary osteoarthritis of knee: Secondary | ICD-10-CM | POA: Diagnosis not present

## 2015-02-10 DIAGNOSIS — N183 Chronic kidney disease, stage 3 (moderate): Secondary | ICD-10-CM | POA: Diagnosis not present

## 2015-02-10 DIAGNOSIS — I129 Hypertensive chronic kidney disease with stage 1 through stage 4 chronic kidney disease, or unspecified chronic kidney disease: Secondary | ICD-10-CM | POA: Diagnosis not present

## 2015-02-11 DIAGNOSIS — E1140 Type 2 diabetes mellitus with diabetic neuropathy, unspecified: Secondary | ICD-10-CM | POA: Diagnosis not present

## 2015-02-11 DIAGNOSIS — L03116 Cellulitis of left lower limb: Secondary | ICD-10-CM | POA: Diagnosis not present

## 2015-02-11 DIAGNOSIS — N183 Chronic kidney disease, stage 3 (moderate): Secondary | ICD-10-CM | POA: Diagnosis not present

## 2015-02-11 DIAGNOSIS — I129 Hypertensive chronic kidney disease with stage 1 through stage 4 chronic kidney disease, or unspecified chronic kidney disease: Secondary | ICD-10-CM | POA: Diagnosis not present

## 2015-02-11 DIAGNOSIS — L03115 Cellulitis of right lower limb: Secondary | ICD-10-CM | POA: Diagnosis not present

## 2015-02-11 DIAGNOSIS — M170 Bilateral primary osteoarthritis of knee: Secondary | ICD-10-CM | POA: Diagnosis not present

## 2015-02-18 DIAGNOSIS — I129 Hypertensive chronic kidney disease with stage 1 through stage 4 chronic kidney disease, or unspecified chronic kidney disease: Secondary | ICD-10-CM | POA: Diagnosis not present

## 2015-02-18 DIAGNOSIS — L03116 Cellulitis of left lower limb: Secondary | ICD-10-CM | POA: Diagnosis not present

## 2015-02-18 DIAGNOSIS — N183 Chronic kidney disease, stage 3 (moderate): Secondary | ICD-10-CM | POA: Diagnosis not present

## 2015-02-18 DIAGNOSIS — M170 Bilateral primary osteoarthritis of knee: Secondary | ICD-10-CM | POA: Diagnosis not present

## 2015-02-18 DIAGNOSIS — E1140 Type 2 diabetes mellitus with diabetic neuropathy, unspecified: Secondary | ICD-10-CM | POA: Diagnosis not present

## 2015-02-18 DIAGNOSIS — L03115 Cellulitis of right lower limb: Secondary | ICD-10-CM | POA: Diagnosis not present

## 2015-02-23 DIAGNOSIS — M170 Bilateral primary osteoarthritis of knee: Secondary | ICD-10-CM | POA: Diagnosis not present

## 2015-02-23 DIAGNOSIS — L03116 Cellulitis of left lower limb: Secondary | ICD-10-CM | POA: Diagnosis not present

## 2015-02-23 DIAGNOSIS — I129 Hypertensive chronic kidney disease with stage 1 through stage 4 chronic kidney disease, or unspecified chronic kidney disease: Secondary | ICD-10-CM | POA: Diagnosis not present

## 2015-02-23 DIAGNOSIS — L03115 Cellulitis of right lower limb: Secondary | ICD-10-CM | POA: Diagnosis not present

## 2015-02-23 DIAGNOSIS — E1140 Type 2 diabetes mellitus with diabetic neuropathy, unspecified: Secondary | ICD-10-CM | POA: Diagnosis not present

## 2015-02-23 DIAGNOSIS — N183 Chronic kidney disease, stage 3 (moderate): Secondary | ICD-10-CM | POA: Diagnosis not present

## 2015-02-25 DIAGNOSIS — L03116 Cellulitis of left lower limb: Secondary | ICD-10-CM | POA: Diagnosis not present

## 2015-02-25 DIAGNOSIS — E1140 Type 2 diabetes mellitus with diabetic neuropathy, unspecified: Secondary | ICD-10-CM | POA: Diagnosis not present

## 2015-02-25 DIAGNOSIS — I129 Hypertensive chronic kidney disease with stage 1 through stage 4 chronic kidney disease, or unspecified chronic kidney disease: Secondary | ICD-10-CM | POA: Diagnosis not present

## 2015-02-25 DIAGNOSIS — N183 Chronic kidney disease, stage 3 (moderate): Secondary | ICD-10-CM | POA: Diagnosis not present

## 2015-02-25 DIAGNOSIS — M170 Bilateral primary osteoarthritis of knee: Secondary | ICD-10-CM | POA: Diagnosis not present

## 2015-02-25 DIAGNOSIS — L03115 Cellulitis of right lower limb: Secondary | ICD-10-CM | POA: Diagnosis not present

## 2015-03-04 DIAGNOSIS — N183 Chronic kidney disease, stage 3 (moderate): Secondary | ICD-10-CM | POA: Diagnosis not present

## 2015-03-04 DIAGNOSIS — M170 Bilateral primary osteoarthritis of knee: Secondary | ICD-10-CM | POA: Diagnosis not present

## 2015-03-04 DIAGNOSIS — L03115 Cellulitis of right lower limb: Secondary | ICD-10-CM | POA: Diagnosis not present

## 2015-03-04 DIAGNOSIS — I129 Hypertensive chronic kidney disease with stage 1 through stage 4 chronic kidney disease, or unspecified chronic kidney disease: Secondary | ICD-10-CM | POA: Diagnosis not present

## 2015-03-04 DIAGNOSIS — L03116 Cellulitis of left lower limb: Secondary | ICD-10-CM | POA: Diagnosis not present

## 2015-03-04 DIAGNOSIS — E1140 Type 2 diabetes mellitus with diabetic neuropathy, unspecified: Secondary | ICD-10-CM | POA: Diagnosis not present

## 2015-03-11 DIAGNOSIS — M170 Bilateral primary osteoarthritis of knee: Secondary | ICD-10-CM | POA: Diagnosis not present

## 2015-03-11 DIAGNOSIS — L03115 Cellulitis of right lower limb: Secondary | ICD-10-CM | POA: Diagnosis not present

## 2015-03-11 DIAGNOSIS — E1140 Type 2 diabetes mellitus with diabetic neuropathy, unspecified: Secondary | ICD-10-CM | POA: Diagnosis not present

## 2015-03-11 DIAGNOSIS — I129 Hypertensive chronic kidney disease with stage 1 through stage 4 chronic kidney disease, or unspecified chronic kidney disease: Secondary | ICD-10-CM | POA: Diagnosis not present

## 2015-03-11 DIAGNOSIS — L03116 Cellulitis of left lower limb: Secondary | ICD-10-CM | POA: Diagnosis not present

## 2015-03-11 DIAGNOSIS — N183 Chronic kidney disease, stage 3 (moderate): Secondary | ICD-10-CM | POA: Diagnosis not present

## 2015-03-12 DIAGNOSIS — L03115 Cellulitis of right lower limb: Secondary | ICD-10-CM | POA: Diagnosis not present

## 2015-03-12 DIAGNOSIS — I129 Hypertensive chronic kidney disease with stage 1 through stage 4 chronic kidney disease, or unspecified chronic kidney disease: Secondary | ICD-10-CM | POA: Diagnosis not present

## 2015-03-12 DIAGNOSIS — M170 Bilateral primary osteoarthritis of knee: Secondary | ICD-10-CM | POA: Diagnosis not present

## 2015-03-12 DIAGNOSIS — E1140 Type 2 diabetes mellitus with diabetic neuropathy, unspecified: Secondary | ICD-10-CM | POA: Diagnosis not present

## 2015-03-12 DIAGNOSIS — N183 Chronic kidney disease, stage 3 (moderate): Secondary | ICD-10-CM | POA: Diagnosis not present

## 2015-03-12 DIAGNOSIS — L03116 Cellulitis of left lower limb: Secondary | ICD-10-CM | POA: Diagnosis not present

## 2015-03-14 ENCOUNTER — Encounter (HOSPITAL_COMMUNITY): Payer: Self-pay

## 2015-03-14 ENCOUNTER — Observation Stay (HOSPITAL_COMMUNITY): Payer: Medicare Other

## 2015-03-14 ENCOUNTER — Emergency Department (HOSPITAL_COMMUNITY): Payer: Medicare Other

## 2015-03-14 ENCOUNTER — Inpatient Hospital Stay (HOSPITAL_COMMUNITY)
Admission: EM | Admit: 2015-03-14 | Discharge: 2015-03-17 | DRG: 074 | Disposition: A | Discharge status: Skilled Nursing Facility | Payer: Medicare Other | Attending: Internal Medicine | Admitting: Internal Medicine

## 2015-03-14 DIAGNOSIS — G4733 Obstructive sleep apnea (adult) (pediatric): Secondary | ICD-10-CM | POA: Diagnosis present

## 2015-03-14 DIAGNOSIS — I471 Supraventricular tachycardia: Secondary | ICD-10-CM | POA: Diagnosis not present

## 2015-03-14 DIAGNOSIS — M6281 Muscle weakness (generalized): Secondary | ICD-10-CM | POA: Diagnosis not present

## 2015-03-14 DIAGNOSIS — I639 Cerebral infarction, unspecified: Secondary | ICD-10-CM | POA: Diagnosis not present

## 2015-03-14 DIAGNOSIS — R29810 Facial weakness: Secondary | ICD-10-CM | POA: Insufficient documentation

## 2015-03-14 DIAGNOSIS — I10 Essential (primary) hypertension: Secondary | ICD-10-CM | POA: Diagnosis not present

## 2015-03-14 DIAGNOSIS — R278 Other lack of coordination: Secondary | ICD-10-CM | POA: Diagnosis not present

## 2015-03-14 DIAGNOSIS — E1140 Type 2 diabetes mellitus with diabetic neuropathy, unspecified: Secondary | ICD-10-CM | POA: Diagnosis not present

## 2015-03-14 DIAGNOSIS — I5032 Chronic diastolic (congestive) heart failure: Secondary | ICD-10-CM | POA: Diagnosis present

## 2015-03-14 DIAGNOSIS — Z8546 Personal history of malignant neoplasm of prostate: Secondary | ICD-10-CM

## 2015-03-14 DIAGNOSIS — E1142 Type 2 diabetes mellitus with diabetic polyneuropathy: Secondary | ICD-10-CM

## 2015-03-14 DIAGNOSIS — R471 Dysarthria and anarthria: Secondary | ICD-10-CM

## 2015-03-14 DIAGNOSIS — I509 Heart failure, unspecified: Secondary | ICD-10-CM | POA: Diagnosis not present

## 2015-03-14 DIAGNOSIS — R4781 Slurred speech: Secondary | ICD-10-CM | POA: Diagnosis not present

## 2015-03-14 DIAGNOSIS — Z5189 Encounter for other specified aftercare: Secondary | ICD-10-CM | POA: Diagnosis not present

## 2015-03-14 DIAGNOSIS — G629 Polyneuropathy, unspecified: Secondary | ICD-10-CM

## 2015-03-14 DIAGNOSIS — E785 Hyperlipidemia, unspecified: Secondary | ICD-10-CM | POA: Diagnosis present

## 2015-03-14 DIAGNOSIS — N179 Acute kidney failure, unspecified: Secondary | ICD-10-CM | POA: Diagnosis not present

## 2015-03-14 DIAGNOSIS — Z8673 Personal history of transient ischemic attack (TIA), and cerebral infarction without residual deficits: Secondary | ICD-10-CM | POA: Diagnosis not present

## 2015-03-14 DIAGNOSIS — I480 Paroxysmal atrial fibrillation: Secondary | ICD-10-CM | POA: Insufficient documentation

## 2015-03-14 DIAGNOSIS — I6340 Cerebral infarction due to embolism of unspecified cerebral artery: Secondary | ICD-10-CM | POA: Diagnosis not present

## 2015-03-14 DIAGNOSIS — I6789 Other cerebrovascular disease: Secondary | ICD-10-CM | POA: Diagnosis not present

## 2015-03-14 DIAGNOSIS — R1312 Dysphagia, oropharyngeal phase: Secondary | ICD-10-CM | POA: Diagnosis not present

## 2015-03-14 DIAGNOSIS — R41841 Cognitive communication deficit: Secondary | ICD-10-CM | POA: Diagnosis not present

## 2015-03-14 DIAGNOSIS — Z923 Personal history of irradiation: Secondary | ICD-10-CM

## 2015-03-14 DIAGNOSIS — R0989 Other specified symptoms and signs involving the circulatory and respiratory systems: Secondary | ICD-10-CM | POA: Diagnosis not present

## 2015-03-14 DIAGNOSIS — G510 Bell's palsy: Secondary | ICD-10-CM | POA: Diagnosis not present

## 2015-03-14 DIAGNOSIS — R1310 Dysphagia, unspecified: Secondary | ICD-10-CM | POA: Diagnosis not present

## 2015-03-14 DIAGNOSIS — R0602 Shortness of breath: Secondary | ICD-10-CM | POA: Diagnosis not present

## 2015-03-14 DIAGNOSIS — I517 Cardiomegaly: Secondary | ICD-10-CM | POA: Diagnosis not present

## 2015-03-14 DIAGNOSIS — D560 Alpha thalassemia: Secondary | ICD-10-CM | POA: Diagnosis present

## 2015-03-14 DIAGNOSIS — Z794 Long term (current) use of insulin: Secondary | ICD-10-CM

## 2015-03-14 DIAGNOSIS — R2681 Unsteadiness on feet: Secondary | ICD-10-CM | POA: Diagnosis not present

## 2015-03-14 DIAGNOSIS — Z8249 Family history of ischemic heart disease and other diseases of the circulatory system: Secondary | ICD-10-CM

## 2015-03-14 DIAGNOSIS — N401 Enlarged prostate with lower urinary tract symptoms: Secondary | ICD-10-CM | POA: Diagnosis not present

## 2015-03-14 DIAGNOSIS — Z7902 Long term (current) use of antithrombotics/antiplatelets: Secondary | ICD-10-CM | POA: Diagnosis not present

## 2015-03-14 DIAGNOSIS — N189 Chronic kidney disease, unspecified: Secondary | ICD-10-CM | POA: Diagnosis not present

## 2015-03-14 DIAGNOSIS — R600 Localized edema: Secondary | ICD-10-CM | POA: Diagnosis not present

## 2015-03-14 DIAGNOSIS — Z7982 Long term (current) use of aspirin: Secondary | ICD-10-CM

## 2015-03-14 LAB — CBC
HCT: 43.3 % (ref 39.0–52.0)
Hemoglobin: 13.5 g/dL (ref 13.0–17.0)
MCH: 21.5 pg — AB (ref 26.0–34.0)
MCHC: 31.2 g/dL (ref 30.0–36.0)
MCV: 69.1 fL — ABNORMAL LOW (ref 78.0–100.0)
Platelets: 188 10*3/uL (ref 150–400)
RBC: 6.27 MIL/uL — ABNORMAL HIGH (ref 4.22–5.81)
RDW: 15.5 % (ref 11.5–15.5)
WBC: 11 10*3/uL — ABNORMAL HIGH (ref 4.0–10.5)

## 2015-03-14 LAB — DIFFERENTIAL
Basophils Absolute: 0 10*3/uL (ref 0.0–0.1)
Basophils Relative: 0 % (ref 0–1)
EOS PCT: 2 % (ref 0–5)
Eosinophils Absolute: 0.2 10*3/uL (ref 0.0–0.7)
Lymphocytes Relative: 17 % (ref 12–46)
Lymphs Abs: 1.9 10*3/uL (ref 0.7–4.0)
MONOS PCT: 8 % (ref 3–12)
Monocytes Absolute: 0.9 10*3/uL (ref 0.1–1.0)
NEUTROS PCT: 73 % (ref 43–77)
Neutro Abs: 8 10*3/uL — ABNORMAL HIGH (ref 1.7–7.7)

## 2015-03-14 LAB — COMPREHENSIVE METABOLIC PANEL
ALT: 21 U/L (ref 0–53)
ANION GAP: 11 (ref 5–15)
AST: 23 U/L (ref 0–37)
Albumin: 3.6 g/dL (ref 3.5–5.2)
Alkaline Phosphatase: 66 U/L (ref 39–117)
BILIRUBIN TOTAL: 0.6 mg/dL (ref 0.3–1.2)
BUN: 17 mg/dL (ref 6–23)
CALCIUM: 9.1 mg/dL (ref 8.4–10.5)
CHLORIDE: 102 mmol/L (ref 96–112)
CO2: 26 mmol/L (ref 19–32)
CREATININE: 1.21 mg/dL (ref 0.50–1.35)
GFR calc Af Amer: 63 mL/min — ABNORMAL LOW (ref >90)
GFR, EST NON AFRICAN AMERICAN: 54 mL/min — AB (ref >90)
Glucose, Bld: 129 mg/dL — ABNORMAL HIGH (ref 70–99)
POTASSIUM: 4.2 mmol/L (ref 3.5–5.1)
Sodium: 139 mmol/L (ref 135–145)
TOTAL PROTEIN: 6.6 g/dL (ref 6.0–8.3)

## 2015-03-14 LAB — I-STAT TROPONIN, ED: TROPONIN I, POC: 0 ng/mL (ref 0.00–0.08)

## 2015-03-14 LAB — APTT: aPTT: 34 seconds (ref 24–37)

## 2015-03-14 LAB — PROTIME-INR
INR: 1.04 (ref 0.00–1.49)
PROTHROMBIN TIME: 13.7 s (ref 11.6–15.2)

## 2015-03-14 MED ORDER — STROKE: EARLY STAGES OF RECOVERY BOOK: Freq: Once | Status: DC

## 2015-03-14 MED ORDER — INSULIN ASPART 100 UNIT/ML ~~LOC~~ SOLN
0.0000 [IU] | Freq: Three times a day (TID) | SUBCUTANEOUS | Status: DC
  Administered 2015-03-15: 3 [IU] via SUBCUTANEOUS
  Administered 2015-03-16: 9 [IU] via SUBCUTANEOUS
  Administered 2015-03-16: 7 [IU] via SUBCUTANEOUS
  Administered 2015-03-16: 9 [IU] via SUBCUTANEOUS
  Administered 2015-03-17: 7 [IU] via SUBCUTANEOUS
  Administered 2015-03-17: 3 [IU] via SUBCUTANEOUS

## 2015-03-14 MED ORDER — DOCUSATE SODIUM 100 MG PO CAPS
100.0000 mg | ORAL_CAPSULE | Freq: Two times a day (BID) | ORAL | Status: DC
  Administered 2015-03-15 – 2015-03-17 (×5): 100 mg via ORAL
  Filled 2015-03-14 (×5): qty 1

## 2015-03-14 MED ORDER — SODIUM CHLORIDE 0.9 % IJ SOLN
3.0000 mL | Freq: Two times a day (BID) | INTRAMUSCULAR | Status: DC
  Administered 2015-03-15 – 2015-03-16 (×2): 3 mL via INTRAVENOUS

## 2015-03-14 MED ORDER — FUROSEMIDE 40 MG PO TABS
40.0000 mg | ORAL_TABLET | Freq: Every day | ORAL | Status: DC
  Administered 2015-03-15 – 2015-03-17 (×3): 40 mg via ORAL
  Filled 2015-03-14 (×3): qty 1

## 2015-03-14 MED ORDER — LORAZEPAM 2 MG/ML IJ SOLN
1.0000 mg | Freq: Once | INTRAMUSCULAR | Status: AC
  Administered 2015-03-14: 1 mg via INTRAVENOUS
  Filled 2015-03-14: qty 1

## 2015-03-14 MED ORDER — HYDROCODONE-ACETAMINOPHEN 5-325 MG PO TABS: 2.0000 | ORAL_TABLET | ORAL | Status: DC | PRN

## 2015-03-14 MED ORDER — TERAZOSIN HCL 2 MG PO CAPS
4.0000 mg | ORAL_CAPSULE | Freq: Every day | ORAL | Status: DC
  Administered 2015-03-15 – 2015-03-17 (×3): 4 mg via ORAL
  Filled 2015-03-14 (×3): qty 2

## 2015-03-14 MED ORDER — RIFAMPIN 300 MG PO CAPS: 300.0000 mg | ORAL_CAPSULE | Freq: Two times a day (BID) | ORAL | Status: DC

## 2015-03-14 MED ORDER — VITAMIN B-12 1000 MCG PO TABS
1000.0000 ug | ORAL_TABLET | Freq: Every day | ORAL | Status: DC
  Administered 2015-03-15 – 2015-03-17 (×3): 1000 ug via ORAL
  Filled 2015-03-14 (×3): qty 1

## 2015-03-14 MED ORDER — SODIUM CHLORIDE 0.9 % IV SOLN
INTRAVENOUS | Status: AC
  Administered 2015-03-15: 01:00:00 via INTRAVENOUS

## 2015-03-14 MED ORDER — ASPIRIN EC 81 MG PO TBEC
81.0000 mg | DELAYED_RELEASE_TABLET | Freq: Every day | ORAL | Status: DC
  Administered 2015-03-15 – 2015-03-17 (×3): 81 mg via ORAL
  Filled 2015-03-14 (×3): qty 1

## 2015-03-14 MED ORDER — ZOLPIDEM TARTRATE 5 MG PO TABS: 5.0000 mg | ORAL_TABLET | Freq: Every evening | ORAL | Status: DC | PRN

## 2015-03-14 MED ORDER — POTASSIUM CHLORIDE CRYS ER 20 MEQ PO TBCR
20.0000 meq | EXTENDED_RELEASE_TABLET | Freq: Every day | ORAL | Status: DC
  Administered 2015-03-15 – 2015-03-17 (×3): 20 meq via ORAL
  Filled 2015-03-14 (×3): qty 1

## 2015-03-14 MED ORDER — SODIUM CHLORIDE 0.9 % IV SOLN: 250.0000 mL | INTRAVENOUS | Status: DC | PRN

## 2015-03-14 MED ORDER — DSS 100 MG PO CAPS: 100.0000 mg | ORAL_CAPSULE | Freq: Two times a day (BID) | ORAL | Status: DC

## 2015-03-14 MED ORDER — CLOPIDOGREL BISULFATE 75 MG PO TABS
75.0000 mg | ORAL_TABLET | Freq: Every day | ORAL | Status: DC
  Administered 2015-03-15 – 2015-03-17 (×3): 75 mg via ORAL
  Filled 2015-03-14 (×3): qty 1

## 2015-03-14 MED ORDER — PREGABALIN 50 MG PO CAPS
50.0000 mg | ORAL_CAPSULE | Freq: Two times a day (BID) | ORAL | Status: DC
  Administered 2015-03-15 – 2015-03-17 (×5): 50 mg via ORAL
  Filled 2015-03-14 (×5): qty 1

## 2015-03-14 MED ORDER — SODIUM CHLORIDE 0.9 % IJ SOLN: 3.0000 mL | INTRAMUSCULAR | Status: DC | PRN

## 2015-03-14 NOTE — ED Notes (Signed)
Admitting MD at the bedside.  

## 2015-03-14 NOTE — ED Notes (Signed)
MD James at the bedside  

## 2015-03-14 NOTE — ED Notes (Signed)
Patient in MRI. Patient complaining of inability to swallow and breathe during MRI. MD Maudie Mercury made aware.

## 2015-03-14 NOTE — Consult Note (Addendum)
Referring Physician: Jeneen Rinks    Chief Complaint: Left facial droop  HPI: Jared Hall is an 81 y.o. male who reports that he awakened yesterday at his baseline.  He usually sleeps in his recliner and took a nap yesterday afternoon.  When he awakened he noted that he was unable to eat because things were falling out of the left side of his mouth and he was choking on his food if he attempted to swallow.  He waited until today with no improvement in his symptoms.  He reports that he is unable to close his left eye.  He is numb on the left side of his face. Hies left eye is tearing and he feels a fullness in his left ear.   He reports that he has had a stroke in the past, about 14 years ago, with no residual.    Date last known well: Date: 03/13/2015 Time last known well: Unable to determine tPA Given: No: Outside time window  Past Medical History  Diagnosis Date  . Umbilical hernia   . Hypertension   . High cholesterol   . Type II diabetes mellitus   . Stroke ~ 2005    denies residual on 2/123/2015  . Prostate cancer     S/P "8 weeks of radiation"  . CHF (congestive heart failure)   . Onychomycosis   . BPH (benign prostatic hyperplasia)   . Urinary urgency     with incontinence  . Lower extremity edema   . Prostatitis     recurrent  . Sleep apnea   . Alpha thalassemia     Past Surgical History  Procedure Laterality Date  . Ventral hernia repair  12/18/2011    Procedure: HERNIA REPAIR VENTRAL ADULT;  Surgeon: Adin Hector, MD;  Location: [facility];  Service: General;  Laterality: N/A;  . Hernia repair    . Vasectomy      Family history: Father with CAD and died of a MI.  Mother died of pneumonia.    Social History:  reports that he has never smoked. He has never used smokeless tobacco. He reports that he does not drink alcohol or use illicit drugs.  Allergies:  Allergies  Allergen Reactions  . Flomax [Tamsulosin Hcl] Other (See Comments)    Excessive tearing  . Glucophage  [Metformin Hcl] Diarrhea    Medications: I have reviewed the patient's current medications. Prior to Admission:  Current outpatient prescriptions:  .  aspirin EC 81 MG tablet, Take 81 mg by mouth daily., Disp: , Rfl:  .  B Complex Vitamins (VITAMIN B COMPLEX PO), Take 1 tablet by mouth daily., Disp: , Rfl:  .  clopidogrel (PLAVIX) 75 MG tablet, Take 75 mg by mouth daily., Disp: , Rfl:  .  Docusate Sodium (DSS) 100 MG CAPS, Take 100 mg by mouth 2 (two) times daily., Disp: , Rfl:  .  furosemide (LASIX) 80 MG tablet, Take 40 mg by mouth daily., Disp: , Rfl:  .  HYDROcodone-acetaminophen (NORCO/VICODIN) 5-325 MG per tablet, Take 2 tablets by mouth every 4 (four) hours as needed. (Patient taking differently: Take 2 tablets by mouth every 4 (four) hours as needed for moderate pain. ), Disp: 20 tablet, Rfl: 0 .  insulin glargine (LANTUS) 100 UNIT/ML injection, Inject 80 Units into the skin 2 (two) times daily. , Disp: , Rfl:  .  potassium chloride SA (K-DUR,KLOR-CON) 20 MEQ tablet, Take 20 mEq by mouth daily., Disp: , Rfl:  .  pregabalin (LYRICA) 50  MG capsule, Take 50 mg by mouth 2 (two) times daily., Disp: , Rfl:  .  rifampin (RIFADIN) 300 MG capsule, Take 1 capsule (300 mg total) by mouth 2 (two) times daily., Disp: 14 capsule, Rfl: 0 .  terazosin (HYTRIN) 2 MG capsule, Take 4 mg by mouth daily., Disp: , Rfl:  .  vitamin B-12 (CYANOCOBALAMIN) 1000 MCG tablet, Take 1,000 mcg by mouth daily., Disp: , Rfl:  .  zolpidem (AMBIEN) 5 MG tablet, Take 5 mg by mouth at bedtime as needed for sleep., Disp: , Rfl:   ROS: History obtained from the patient  General ROS: negative for - chills, fatigue, fever, night sweats, weight gain or weight loss Psychological ROS: negative for - behavioral disorder, hallucinations, memory difficulties, mood swings or suicidal ideation Ophthalmic ROS: negative for - blurry vision, double vision, eye pain or loss of vision ENT ROS: negative for - epistaxis, nasal discharge,  oral lesions, sore throat, tinnitus or vertigo Allergy and Immunology ROS: negative for - hives or itchy/watery eyes Hematological and Lymphatic ROS: negative for - bleeding problems, bruising or swollen lymph nodes Endocrine ROS: negative for - galactorrhea, hair pattern changes, polydipsia/polyuria or temperature intolerance Respiratory ROS: negative for - cough, hemoptysis, shortness of breath or wheezing Cardiovascular ROS: negative for - chest pain, dyspnea on exertion, edema or irregular heartbeat Gastrointestinal ROS: negative for - abdominal pain, diarrhea, hematemesis, nausea/vomiting or stool incontinence Genito-Urinary ROS: negative for - dysuria, hematuria, incontinence or urinary frequency/urgency Musculoskeletal ROS: negative for - joint swelling or muscular weakness Neurological ROS: as noted in HPI Dermatological ROS: negative for rash and skin lesion changes  Physical Examination: Blood pressure 164/87, pulse 98, temperature 98.5 F (36.9 C), temperature source Oral, resp. rate 25, SpO2 94 %.  HEENT-  Normocephalic, no lesions, without obvious abnormality.  Normal external eye and conjunctiva.  Normal TM's bilaterally.  Normal auditory canals and external ears. Normal external nose, mucus membranes and septum.  Normal pharynx. Cardiovascular- S1, S2 normal, pulses palpable throughout   Lungs- chest clear, no wheezing, rales, normal symmetric air entry Abdomen- soft, non-tender; bowel sounds normal; no masses,  no organomegaly Extremities- bilateral foot dressing Lymph-no adenopathy palpable Musculoskeletal-no joint tenderness, deformity or swelling Skin-warm and dry, no hyperpigmentation, vitiligo, or suspicious lesions  Neurological Examination Mental Status: Alert, oriented, thought content appropriate.  Speech fluent without evidence of aphasia.  Able to follow 3 step commands without difficulty. Cranial Nerves: II: Discs flat bilaterally; Visual fields grossly  normal, pupils equal, round, reactive to light and accommodation III,IV, VI: ptosis not present, extra-ocular motions intact bilaterally V,VII: left facial droop with inability to furrow the forehead on the left and inability to fully close the left eyelid, facial light touch sensation decreased on the left VIII: hearing decreased IX,X: gag reflex absent bilaterally XI: bilateral shoulder shrug XII: midline tongue extension Motor: Right : Upper extremity   5/5    Left:     Upper extremity   5/5  Lower extremity   5/5     Lower extremity   5/5 Tone and bulk:normal tone throughout; no atrophy noted Sensory: Pinprick and light touch intact throughout, bilaterally Deep Tendon Reflexes: 2+ in the upper extremities and absent in the lower extremities Plantars: Right: mute   Left: mute Cerebellar: normal finger-to-nose and normal heel-to-shin testing bilaterally Gait: not tested due to bilateral foot dressings    Laboratory Studies:  Basic Metabolic Panel:  Recent Labs Lab 03/14/15 1935  NA 139  K 4.2  CL 102  CO2 26  GLUCOSE 129*  BUN 17  CREATININE 1.21  CALCIUM 9.1    Liver Function Tests:  Recent Labs Lab 03/14/15 1935  AST 23  ALT 21  ALKPHOS 66  BILITOT 0.6  PROT 6.6  ALBUMIN 3.6   No results for input(s): LIPASE, AMYLASE in the last 168 hours. No results for input(s): AMMONIA in the last 168 hours.  CBC:  Recent Labs Lab 03/14/15 1935  WBC 11.0*  NEUTROABS 8.0*  HGB 13.5  HCT 43.3  MCV 69.1*  PLT 188    Cardiac Enzymes: No results for input(s): CKTOTAL, CKMB, CKMBINDEX, TROPONINI in the last 168 hours.  BNP: Invalid input(s): POCBNP  CBG: No results for input(s): GLUCAP in the last 168 hours.  Microbiology: Results for orders placed or performed during the hospital encounter of 01/11/14  Wound culture     Status: None   Collection Time: 01/11/14  4:26 PM  Result Value Ref Range Status   Specimen Description WOUND RIGHT LEG  Final    Special Requests NONE  Final   Gram Stain   Final    FEW WBC PRESENT, PREDOMINANTLY PMN NO SQUAMOUS EPITHELIAL CELLS SEEN FEW GRAM POSITIVE COCCI IN PAIRS Performed at [facility]   Culture   Final    MULTIPLE ORGANISMS PRESENT, NONE PREDOMINANT Note: NO STAPHYLOCOCCUS AUREUS ISOLATED NO GROUP A STREP (S.PYOGENES) ISOLATED Performed at [facility]   Report Status 01/15/2014 FINAL  Final    Coagulation Studies:  Recent Labs  03/14/15 1935  LABPROT 13.7  INR 1.04    Urinalysis: No results for input(s): COLORURINE, LABSPEC, PHURINE, GLUCOSEU, HGBUR, BILIRUBINUR, KETONESUR, PROTEINUR, UROBILINOGEN, NITRITE, LEUKOCYTESUR in the last 168 hours.  Invalid input(s): APPERANCEUR  Lipid Panel:    Component Value Date/Time   CHOL 99 12/16/2011 1740   TRIG 125 12/16/2011 1740   HDL 25* 12/16/2011 1740   CHOLHDL 4.0 12/16/2011 1740   VLDL 25 12/16/2011 1740   LDLCALC 49 12/16/2011 1740    HgbA1C:  Lab Results  Component Value Date   HGBA1C 8.3* 04/05/2013    Urine Drug Screen:     Component Value Date/Time   LABOPIA POSITIVE* 12/07/2010 2358   COCAINSCRNUR NONE DETECTED 12/07/2010 2358   LABBENZ NONE DETECTED 12/07/2010 2358   AMPHETMU NONE DETECTED 12/07/2010 2358   THCU NONE DETECTED 12/07/2010 2358   LABBARB  12/07/2010 2358    NONE DETECTED        DRUG SCREEN FOR MEDICAL PURPOSES ONLY.  IF CONFIRMATION IS NEEDED FOR ANY PURPOSE, NOTIFY LAB WITHIN 5 DAYS.        LOWEST DETECTABLE LIMITS FOR URINE DRUG SCREEN Drug Class       Cutoff (ng/mL) Amphetamine      1000 Barbiturate      200 Benzodiazepine   197 Tricyclics       588 Opiates          300 Cocaine          300 THC              50    Alcohol Level: No results for input(s): ETH in the last 168 hours.   Imaging: Ct Head (brain) Wo Contrast  03/14/2015   CLINICAL DATA:  Stroke symptoms.  Facial tingling and slurred speech  EXAM: CT HEAD WITHOUT CONTRAST  TECHNIQUE: Contiguous axial  images were obtained from the base of the skull through the vertex without intravenous contrast.  COMPARISON:  CT head 12/06/2014  FINDINGS:  Generalized atrophy. Negative for hydrocephalus. Moderate chronic microvascular ischemic changes in the white matter. Chronic infarct right inferior cerebellum.  Negative for acute infarct. Negative for hemorrhage or mass. Subdural hygroma around the right cerebellar hemisphere unchanged from prior studies  Calvarium intact.  IMPRESSION: Atrophy and chronic ischemia.  No acute abnormality.   Electronically Signed   By: Franchot Gallo M.D.   On: 03/14/2015 20:21    Assessment: 81 y.o. male presenting with what on neurological examination appears to be a peripheral seventh cranial nerve palsy.  The patient has an additional finding on examination of an absent gag.  This is concerning and suggests that there may be an ischemic brainstem lesion.  Head CT personally reviewed and shows small vessel disease but no evidence of acute changes.  Patient with multiple vascular risk factors.  Further work up recommended.    Stroke Risk Factors - diabetes mellitus, hyperlipidemia and hypertension  Plan: 1. HgbA1c, fasting lipid panel 2. MRI of the brain without contrast.  If evidence of an acute ischemic event patient to be admitted for a stroke work to include: PT consult, OT consult, Speech consult, Echocardiogram, Carotid dopplers, Telemetry monitoring and frequent neuro checks. 3. Prophylactic therapy-Continue ASA and Plavix 4. NPO at this time.  Patient has failed swallow screen.   5. Myasthenia panel, TSH, ESR  Case discussed with Dr. Sallyanne Kuster, MD [facility] [phone] 03/14/2015, 9:36 PM

## 2015-03-14 NOTE — H&P (Signed)
Jared Hall is an 81 y.o. male.    Chief Complaint: dysarthria, dysphagia HPI: 81 yo male with dm2, htn, hyperlipidemia,  c/o dysphagia, and dysarthria starting last nite at about 10 pm.  Pt denies headache, vision change. No hx of trauma.  Pt denies any difficulty with moving arms or legs.  Pt presented to ED for evaluation and was seen by neurology who requested MRI brain and r/o CVA.  Pt will be admitted for observation r/o Tia, CVA.   Past Medical History  Diagnosis Date  . Umbilical hernia   . Hypertension   . High cholesterol   . Type II diabetes mellitus   . Stroke ~ 2005    denies residual on 2/123/2015  . Prostate cancer     S/P "8 weeks of radiation"  . CHF (congestive heart failure)   . Onychomycosis   . BPH (benign prostatic hyperplasia)   . Urinary urgency     with incontinence  . Lower extremity edema   . Prostatitis     recurrent  . Sleep apnea   . Alpha thalassemia     Past Surgical History  Procedure Laterality Date  . Ventral hernia repair  12/18/2011    Procedure: HERNIA REPAIR VENTRAL ADULT;  Surgeon: Adin Hector, MD;  Location: [place];  Service: General;  Laterality: N/A;  . Hernia repair    . Vasectomy      Family History  Problem Relation Age of Onset  . Pneumonia Mother   . Heart attack Father    Social History:  reports that he has never smoked. He has never used smokeless tobacco. He reports that he does not drink alcohol or use illicit drugs.  Allergies:  Allergies  Allergen Reactions  . Flomax [Tamsulosin Hcl] Other (See Comments)    Excessive tearing  . Glucophage [Metformin Hcl] Diarrhea   Medications reviewed  (Not in a hospital admission)  Results for orders placed or performed during the hospital encounter of 03/14/15 (from the past 48 hour(s))  Protime-INR     Status: None   Collection Time: 03/14/15  7:35 PM  Result Value Ref Range   Prothrombin Time 13.7 11.6 - 15.2 seconds   INR 1.04 0.00 - 1.49  APTT     Status: None    Collection Time: 03/14/15  7:35 PM  Result Value Ref Range   aPTT 34 24 - 37 seconds  CBC     Status: Abnormal   Collection Time: 03/14/15  7:35 PM  Result Value Ref Range   WBC 11.0 (H) 4.0 - 10.5 K/uL   RBC 6.27 (H) 4.22 - 5.81 MIL/uL   Hemoglobin 13.5 13.0 - 17.0 g/dL   HCT 43.3 39.0 - 52.0 %   MCV 69.1 (L) 78.0 - 100.0 fL   MCH 21.5 (L) 26.0 - 34.0 pg   MCHC 31.2 30.0 - 36.0 g/dL   RDW 15.5 11.5 - 15.5 %   Platelets 188 150 - 400 K/uL  Differential     Status: Abnormal   Collection Time: 03/14/15  7:35 PM  Result Value Ref Range   Neutrophils Relative % 73 43 - 77 %   Lymphocytes Relative 17 12 - 46 %   Monocytes Relative 8 3 - 12 %   Eosinophils Relative 2 0 - 5 %   Basophils Relative 0 0 - 1 %   Neutro Abs 8.0 (H) 1.7 - 7.7 K/uL   Lymphs Abs 1.9 0.7 - 4.0 K/uL   Monocytes  Absolute 0.9 0.1 - 1.0 K/uL   Eosinophils Absolute 0.2 0.0 - 0.7 K/uL   Basophils Absolute 0.0 0.0 - 0.1 K/uL   Smear Review MORPHOLOGY UNREMARKABLE   Comprehensive metabolic panel     Status: Abnormal   Collection Time: 03/14/15  7:35 PM  Result Value Ref Range   Sodium 139 135 - 145 mmol/L   Potassium 4.2 3.5 - 5.1 mmol/L   Chloride 102 96 - 112 mmol/L   CO2 26 19 - 32 mmol/L   Glucose, Bld 129 (H) 70 - 99 mg/dL   BUN 17 6 - 23 mg/dL   Creatinine, Ser 1.21 0.50 - 1.35 mg/dL   Calcium 9.1 8.4 - 10.5 mg/dL   Total Protein 6.6 6.0 - 8.3 g/dL   Albumin 3.6 3.5 - 5.2 g/dL   AST 23 0 - 37 U/L   ALT 21 0 - 53 U/L   Alkaline Phosphatase 66 39 - 117 U/L   Total Bilirubin 0.6 0.3 - 1.2 mg/dL   GFR calc non Af Amer 54 (L) >90 mL/min   GFR calc Af Amer 63 (L) >90 mL/min    Comment: (NOTE) The eGFR has been calculated using the CKD EPI equation. This calculation has not been validated in all clinical situations. eGFR's persistently <90 mL/min signify possible Chronic Kidney Disease.    Anion gap 11 5 - 15  I-stat troponin, ED (not at [facility], [facility])     Status: None   Collection Time: 03/14/15  7:43  PM  Result Value Ref Range   Troponin i, poc 0.00 0.00 - 0.08 ng/mL   Comment 3            Comment: Due to the release kinetics of cTnI, a negative result within the first hours of the onset of symptoms does not rule out myocardial infarction with certainty. If myocardial infarction is still suspected, repeat the test at appropriate intervals.    Ct Head (brain) Wo Contrast  03/14/2015   CLINICAL DATA:  Stroke symptoms.  Facial tingling and slurred speech  EXAM: CT HEAD WITHOUT CONTRAST  TECHNIQUE: Contiguous axial images were obtained from the base of the skull through the vertex without intravenous contrast.  COMPARISON:  CT head 12/06/2014  FINDINGS: Generalized atrophy. Negative for hydrocephalus. Moderate chronic microvascular ischemic changes in the white matter. Chronic infarct right inferior cerebellum.  Negative for acute infarct. Negative for hemorrhage or mass. Subdural hygroma around the right cerebellar hemisphere unchanged from prior studies  Calvarium intact.  IMPRESSION: Atrophy and chronic ischemia.  No acute abnormality.   Electronically Signed   By: Franchot Gallo M.D.   On: 03/14/2015 20:21    Review of Systems  Constitutional: Negative for fever, chills, weight loss, malaise/fatigue and diaphoresis.  HENT: Positive for tinnitus. Negative for congestion, ear discharge, ear pain, hearing loss, nosebleeds and sore throat.        Left ear tinnitus x1 month  Eyes: Negative for blurred vision, double vision, photophobia, pain, discharge and redness.  Respiratory: Negative for cough, hemoptysis, sputum production, shortness of breath, wheezing and stridor.   Cardiovascular: Negative for chest pain, palpitations, orthopnea, claudication, leg swelling and PND.  Gastrointestinal: Negative for heartburn, nausea, vomiting, abdominal pain, diarrhea, constipation, blood in stool and melena.  Genitourinary: Negative for dysuria, urgency, frequency, hematuria and flank pain.   Musculoskeletal: Positive for back pain. Negative for myalgias, joint pain, falls and neck pain.  Skin: Negative.   Neurological: Negative for dizziness, tingling, tremors, sensory change, speech change,  focal weakness, seizures, loss of consciousness, weakness and headaches.  Endo/Heme/Allergies: Negative for environmental allergies and polydipsia. Does not bruise/bleed easily.  Psychiatric/Behavioral: Negative for depression, suicidal ideas, hallucinations, memory loss and substance abuse. The patient is not nervous/anxious and does not have insomnia.     Blood pressure 164/87, pulse 98, temperature 98.5 F (36.9 C), temperature source Oral, resp. rate 25, SpO2 94 %. Physical Exam  Constitutional: He is oriented to person, place, and time. He appears well-developed and well-nourished.  HENT:  Head: Normocephalic and atraumatic.  Mouth/Throat: No oropharyngeal exudate.  Eyes: Conjunctivae and EOM are normal. Pupils are equal, round, and reactive to light. No scleral icterus.  Neck: Normal range of motion. Neck supple. No JVD present. No tracheal deviation present. No thyromegaly present.  Cardiovascular: Normal rate and regular rhythm.  Exam reveals no gallop and no friction rub.   No murmur heard. Respiratory: Effort normal and breath sounds normal. No respiratory distress. He has no wheezes. He has no rales. He exhibits no tenderness.  GI: Soft. Bowel sounds are normal. He exhibits no distension and no mass. There is no tenderness. There is no rebound and no guarding.  Musculoskeletal: Normal range of motion. He exhibits no edema or tenderness.  Lymphadenopathy:    He has no cervical adenopathy.  Neurological: He is alert and oriented to person, place, and time. He has normal reflexes. He displays normal reflexes. No cranial nerve deficit. He exhibits normal muscle tone. Coordination normal.  + left facial droop  Skin: Skin is warm and dry. No rash noted. No erythema. No pallor.   Psychiatric: He has a normal mood and affect. His behavior is normal. Judgment and thought content normal.     Assessment/Plan Dysarthria, Dysphagia Tele MRI brain Carotid ultrasound,  Cardiac echo Cont aspirin, plavix Appreciate neurology consultation lipitor 37m po qhs,  Check lipid, esr, ana, tsh,   Dm2 fsbs ac and qhs, iss  Hypertension Hydralazine 144miv q6h prn sbp>160  DVT prophylaxis :  scd  KIJani Gravel/24/2016, 9:47 PM

## 2015-03-14 NOTE — ED Notes (Signed)
Patient returned from CT

## 2015-03-14 NOTE — ED Notes (Signed)
Dr. Doy Mince at bedside speaking with patient, and family

## 2015-03-14 NOTE — ED Notes (Signed)
PT back in room  

## 2015-03-14 NOTE — ED Notes (Signed)
Per EMS, patient is coming from home started to notice numbness and tingling to face with slurred speech yesterday at 1430. Patient is unable to swallow and unable to close left eye lid. Complains of SOB and stomach is distended. Patient reports regular bowel movement yesterday. Denies any pain or headache. Patient denies any N/V/D. Vitals per EMS 190/100, 156 CBG, 100 HR. Patient has HX of Stroke with no deficits.

## 2015-03-14 NOTE — ED Notes (Signed)
Patient transported to MRI 

## 2015-03-15 ENCOUNTER — Inpatient Hospital Stay (HOSPITAL_COMMUNITY): Payer: Medicare Other

## 2015-03-15 DIAGNOSIS — I6340 Cerebral infarction due to embolism of unspecified cerebral artery: Secondary | ICD-10-CM

## 2015-03-15 DIAGNOSIS — I639 Cerebral infarction, unspecified: Secondary | ICD-10-CM | POA: Insufficient documentation

## 2015-03-15 DIAGNOSIS — R29810 Facial weakness: Secondary | ICD-10-CM | POA: Insufficient documentation

## 2015-03-15 DIAGNOSIS — G510 Bell's palsy: Secondary | ICD-10-CM | POA: Insufficient documentation

## 2015-03-15 DIAGNOSIS — I480 Paroxysmal atrial fibrillation: Secondary | ICD-10-CM | POA: Insufficient documentation

## 2015-03-15 LAB — LIPID PANEL
Cholesterol: 140 mg/dL (ref 0–200)
HDL: 37 mg/dL — ABNORMAL LOW (ref >39)
LDL Cholesterol: 68 mg/dL (ref 0–99)
Total CHOL/HDL Ratio: 3.8 RATIO
Triglycerides: 177 mg/dL — ABNORMAL HIGH (ref <150)
VLDL: 35 mg/dL (ref 0–40)

## 2015-03-15 LAB — GLUCOSE, CAPILLARY
GLUCOSE-CAPILLARY: 156 mg/dL — AB (ref 70–99)
GLUCOSE-CAPILLARY: 283 mg/dL — AB (ref 70–99)
Glucose-Capillary: 94 mg/dL (ref 70–99)
Glucose-Capillary: 95 mg/dL (ref 70–99)
Glucose-Capillary: 222 mg/dL — ABNORMAL HIGH (ref 70–99)

## 2015-03-15 LAB — COMPREHENSIVE METABOLIC PANEL
ALK PHOS: 68 U/L (ref 39–117)
ALT: 20 U/L (ref 0–53)
AST: 23 U/L (ref 0–37)
Albumin: 3.6 g/dL (ref 3.5–5.2)
Anion gap: 10 (ref 5–15)
BUN: 14 mg/dL (ref 6–23)
CHLORIDE: 99 mmol/L (ref 96–112)
CO2: 29 mmol/L (ref 19–32)
CREATININE: 1.11 mg/dL (ref 0.50–1.35)
Calcium: 9.3 mg/dL (ref 8.4–10.5)
GFR calc Af Amer: 69 mL/min — ABNORMAL LOW (ref >90)
GFR, EST NON AFRICAN AMERICAN: 60 mL/min — AB (ref >90)
Glucose, Bld: 118 mg/dL — ABNORMAL HIGH (ref 70–99)
Potassium: 3.9 mmol/L (ref 3.5–5.1)
Sodium: 138 mmol/L (ref 135–145)
Total Bilirubin: 0.9 mg/dL (ref 0.3–1.2)
Total Protein: 7 g/dL (ref 6.0–8.3)

## 2015-03-15 LAB — CBC
HEMATOCRIT: 43.8 % (ref 39.0–52.0)
Hemoglobin: 13.9 g/dL (ref 13.0–17.0)
MCH: 21.7 pg — ABNORMAL LOW (ref 26.0–34.0)
MCHC: 31.7 g/dL (ref 30.0–36.0)
MCV: 68.2 fL — AB (ref 78.0–100.0)
Platelets: 192 10*3/uL (ref 150–400)
RBC: 6.42 MIL/uL — AB (ref 4.22–5.81)
RDW: 15.4 % (ref 11.5–15.5)
WBC: 10.9 10*3/uL — ABNORMAL HIGH (ref 4.0–10.5)

## 2015-03-15 LAB — SEDIMENTATION RATE: SED RATE: 2 mm/h (ref 0–16)

## 2015-03-15 LAB — VITAMIN B12: Vitamin B-12: 448 pg/mL (ref 211–911)

## 2015-03-15 LAB — TSH: TSH: 1.253 u[IU]/mL (ref 0.350–4.500)

## 2015-03-15 MED ORDER — PREDNISOLONE 15 MG/5ML PO SOLN
60.0000 mg | Freq: Every day | ORAL | Status: DC
  Administered 2015-03-15 – 2015-03-17 (×3): 60 mg via ORAL
  Filled 2015-03-15 (×4): qty 20

## 2015-03-15 MED ORDER — INSULIN GLARGINE 100 UNIT/ML ~~LOC~~ SOLN
20.0000 [IU] | Freq: Every day | SUBCUTANEOUS | Status: DC
  Administered 2015-03-15 – 2015-03-16 (×2): 20 [IU] via SUBCUTANEOUS
  Filled 2015-03-15 (×3): qty 0.2

## 2015-03-15 MED ORDER — METOPROLOL TARTRATE 25 MG PO TABS
25.0000 mg | ORAL_TABLET | Freq: Two times a day (BID) | ORAL | Status: DC
  Administered 2015-03-15 – 2015-03-17 (×4): 25 mg via ORAL
  Filled 2015-03-15 (×4): qty 1

## 2015-03-15 MED ORDER — LORAZEPAM 2 MG/ML IJ SOLN
1.0000 mg | Freq: Once | INTRAMUSCULAR | Status: AC | PRN
  Administered 2015-03-15: 1 mg via INTRAVENOUS
  Filled 2015-03-15: qty 1

## 2015-03-15 MED ORDER — VALACYCLOVIR HCL 500 MG PO TABS
1000.0000 mg | ORAL_TABLET | Freq: Three times a day (TID) | ORAL | Status: DC
  Administered 2015-03-15 – 2015-03-17 (×7): 1000 mg via ORAL
  Filled 2015-03-15 (×7): qty 2

## 2015-03-15 MED ORDER — METOPROLOL TARTRATE 1 MG/ML IV SOLN
5.0000 mg | Freq: Once | INTRAVENOUS | Status: AC
Start: 2015-03-15 — End: 2015-03-15
  Administered 2015-03-15: 5 mg via INTRAVENOUS
  Filled 2015-03-15: qty 5

## 2015-03-15 MED ORDER — RESOURCE THICKENUP CLEAR PO POWD
ORAL | Status: DC | PRN
  Filled 2015-03-15: qty 125

## 2015-03-15 MED ORDER — PREDNISOLONE 5 MG PO TABS
60.0000 mg | ORAL_TABLET | Freq: Every day | ORAL | Status: DC
  Filled 2015-03-15: qty 12

## 2015-03-15 MED ORDER — METOPROLOL TARTRATE 1 MG/ML IV SOLN: 5.0000 mg | Freq: Once | INTRAVENOUS | Status: DC

## 2015-03-15 MED ORDER — HEPARIN SODIUM (PORCINE) 5000 UNIT/ML IJ SOLN
5000.0000 [IU] | Freq: Three times a day (TID) | INTRAMUSCULAR | Status: DC
  Administered 2015-03-15 – 2015-03-17 (×7): 5000 [IU] via SUBCUTANEOUS
  Filled 2015-03-15 (×6): qty 1

## 2015-03-15 MED ORDER — INSULIN ASPART 100 UNIT/ML ~~LOC~~ SOLN
2.0000 [IU] | Freq: Once | SUBCUTANEOUS | Status: AC
  Administered 2015-03-15: 2 [IU] via SUBCUTANEOUS

## 2015-03-15 NOTE — Evaluation (Signed)
Clinical/Bedside Swallow Evaluation Patient Details  Name: Jared Hall MRN: [id number] Date of Birth: 10/12/1931  Today's Date: 03/15/2015 Time: SLP Start Time (ACUTE ONLY): 1340 SLP Stop Time (ACUTE ONLY): 1403 SLP Time Calculation (min) (ACUTE ONLY): 23 min  Past Medical History:  Past Medical History  Diagnosis Date  . Umbilical hernia   . Hypertension   . High cholesterol   . Type II diabetes mellitus   . Stroke ~ 2005    denies residual on 2/123/2015  . Prostate cancer     S/P "8 weeks of radiation"  . CHF (congestive heart failure)   . Onychomycosis   . BPH (benign prostatic hyperplasia)   . Urinary urgency     with incontinence  . Lower extremity edema   . Prostatitis     recurrent  . Sleep apnea   . Alpha thalassemia    Past Surgical History:  Past Surgical History  Procedure Laterality Date  . Ventral hernia repair  12/18/2011    Procedure: HERNIA REPAIR VENTRAL ADULT;  Surgeon: Adin Hector, MD;  Location: [place];  Service: General;  Laterality: N/A;  . Hernia repair    . Vasectomy     HPI:  Jared Hall is an 81 y.o. male who reports that he was unable to eat because things were falling out of the left side of his mouth and he was choking on his food if he attempted to swallow. He reports that he is unable to close his left eye. He is numb on the left side of his face. His left eye is tearing and he feels a fullness in his left ear. He reports that he has had a stroke in the past, about 14 years ago, with no residual. 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 MRI shows Small 6 mm acute ischemic infarct involving the cortical gray matter of the high left frontal lobe in the region of the motor strip. 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 Pt has anterior spillage of liquids, improved with straw placed on right side of mouth, mild oral residuals. Sips of thin liquids result in choking episodes, likely due to premature loss of bolus or residuals spilling to pharynx post swallow. Pt is recommended to begin pureed diet with nectar thick liquids. SLP to f/u with MBS tomorrow for objective assessment of function and safest, least restricitve diet before d/c.     Aspiration Risk  Moderate    Diet Recommendation Dysphagia 1 (Puree);Nectar-thick liquid   Liquid Administration via: Straw Medication Administration: Whole meds with puree Compensations: Slow rate;Small sips/bites;Check for pocketing;Check for anterior loss    Other  Recommendations Oral Care Recommendations: Oral care BID Other Recommendations: Order thickener from pharmacy   Follow Up Recommendations  Other (comment) (TBD)    Frequency and Duration min 1 x/week  1 week   Pertinent Vitals/Pain NA    SLP Swallow Goals     Swallow Study Prior Functional Status  Type of Home: House    General HPI: Jared Hall is an 81 y.o. male who reports that he was unable to eat because things were falling out of the left side of his mouth and he was choking on his food if he attempted to swallow.  He reports that he is unable to close his left eye. He is numb on the left side of his face. His left eye is tearing and he feels a fullness in his left ear. He reports that he has had a stroke in the past, about 14 years ago, with no residual. 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 MRI shows Small 6 mm acute ischemic infarct involving the cortical gray matter of the high left frontal lobe in the region of the motor strip. Per neuro notes, this is an incidental finding, pt with Bell's Palsy Type of Study: Bedside swallow evaluation Diet Prior to this Study: NPO Temperature Spikes Noted: No Respiratory  Status: Room air History of Recent Intubation: No Behavior/Cognition: Alert;Cooperative;Pleasant mood Oral Cavity - Dentition: Edentulous Self-Feeding Abilities: Able to feed self Patient Positioning: Upright in chair Baseline Vocal Quality: Clear Volitional Cough: Strong Volitional Swallow: Able to elicit    Oral/Motor/Sensory Function Overall Oral Motor/Sensory Function: Impaired Labial ROM: Reduced left Labial Symmetry: Abnormal symmetry left Labial Strength: Reduced Labial Sensation: Reduced Lingual ROM: Within Functional Limits Lingual Symmetry: Within Functional Limits Lingual Strength: Within Functional Limits Lingual Sensation: Within Functional Limits Facial ROM: Reduced left Facial Symmetry: Left droop;Left drooping eyelid Facial Strength: Reduced Facial Sensation: Reduced Velum: Within Functional Limits Mandible: Within Functional Limits   Ice Chips     Thin Liquid Thin Liquid: Impaired Presentation: Cup;Straw;Self Fed Oral Phase Impairments: Reduced labial seal Oral Phase Functional Implications: Left anterior spillage;Left lateral sulci pocketing Pharyngeal  Phase Impairments: Cough - Immediate    Nectar Thick Nectar Thick Liquid: Not tested   Honey Thick Honey Thick Liquid: Impaired Presentation: Spoon;Cup;Self fed Oral Phase Impairments: Reduced labial seal Oral Phase Functional Implications: Left anterior spillage;Left lateral sulci pocketing;Oral residue   Puree Puree: Impaired Presentation: Spoon Oral Phase Impairments: Reduced labial seal Oral Phase Functional Implications: Left lateral sulci pocketing   Solid   GO    Solid: Not tested      [name], MA CCC-SLP [phone]  Trino Higinbotham, Katherene Ponto 03/15/2015,2:33 PM

## 2015-03-15 NOTE — ED Provider Notes (Signed)
CSN: [id number]     Arrival date & time 03/14/15  1916 History   First MD Initiated Contact with Patient 03/14/15 1940     Chief Complaint  Patient presents with  . Stroke Symptoms      HPI  Reevaluation of facial weakness and drooping difficulty closing his eye and trouble eating and swallowing. Patient had a stroke in the past but "didn't believe anything wrong". Feels weak in his left face. States when he tries to eat or drink the food just runs out of the left side of his mouth. He is unable to swallow. Feels like he cannot completely close his left eye.  He denies headache. States his stomach feels "full. Denies nausea vomiting or abdominal pain. States he is frankly short of breath but denies being so now.   Past Medical History  Diagnosis Date  . Umbilical hernia   . Hypertension   . High cholesterol   . Type II diabetes mellitus   . Stroke ~ 2005    denies residual on 2/123/2015  . Prostate cancer     S/P "8 weeks of radiation"  . CHF (congestive heart failure)   . Onychomycosis   . BPH (benign prostatic hyperplasia)   . Urinary urgency     with incontinence  . Lower extremity edema   . Prostatitis     recurrent  . Sleep apnea   . Alpha thalassemia    Past Surgical History  Procedure Laterality Date  . Ventral hernia repair  12/18/2011    Procedure: HERNIA REPAIR VENTRAL ADULT;  Surgeon: Adin Hector, MD;  Location: [place];  Service: General;  Laterality: N/A;  . Hernia repair    . Vasectomy     Family History  Problem Relation Age of Onset  . Pneumonia Mother   . Heart attack Father    History  Substance Use Topics  . Smoking status: Never Smoker   . Smokeless tobacco: Never Used  . Alcohol Use: No    Review of Systems  Constitutional: Negative for fever, chills, diaphoresis, appetite change and fatigue.  HENT: Negative for mouth sores, sore throat and trouble swallowing.   Eyes: Negative for visual disturbance.  Respiratory: Negative for cough,  chest tightness, shortness of breath and wheezing.   Cardiovascular: Negative for chest pain.  Gastrointestinal: Negative for nausea, vomiting, abdominal pain, diarrhea and abdominal distention.  Endocrine: Negative for polydipsia, polyphagia and polyuria.  Genitourinary: Negative for dysuria, frequency and hematuria.  Musculoskeletal: Negative for gait problem.  Skin: Negative for color change, pallor and rash.  Neurological: Negative for dizziness, syncope, light-headedness and headaches.       Facial weakness. Difficuly swallowing.  Hematological: Does not bruise/bleed easily.  Psychiatric/Behavioral: Negative for behavioral problems and confusion.      Allergies  Flomax and Glucophage  Home Medications   Prior to Admission medications   Medication Sig Start Date End Date Taking? Authorizing Provider  aspirin EC 81 MG tablet Take 81 mg by mouth daily.   Yes Historical Provider, MD  B Complex Vitamins (VITAMIN B COMPLEX PO) Take 1 tablet by mouth daily.   Yes Historical Provider, MD  clopidogrel (PLAVIX) 75 MG tablet Take 75 mg by mouth daily.   Yes Historical Provider, MD  Docusate Sodium (DSS) 100 MG CAPS Take 100 mg by mouth 2 (two) times daily. 04/08/13  Yes Josetta Huddle, MD  furosemide (LASIX) 80 MG tablet Take 40 mg by mouth daily.   Yes Historical Provider, MD  HYDROcodone-acetaminophen (NORCO/VICODIN) 5-325 MG per tablet Take 2 tablets by mouth every 4 (four) hours as needed. Patient taking differently: Take 2 tablets by mouth every 4 (four) hours as needed for moderate pain.  12/06/14  Yes Hollace Kinnier Sofia, PA-C  insulin glargine (LANTUS) 100 UNIT/ML injection Inject 80 Units into the skin 2 (two) times daily.    Yes Historical Provider, MD  potassium chloride SA (K-DUR,KLOR-CON) 20 MEQ tablet Take 20 mEq by mouth daily.   Yes Historical Provider, MD  pregabalin (LYRICA) 50 MG capsule Take 50 mg by mouth 2 (two) times daily.   Yes Historical Provider, MD  rifampin (RIFADIN) 300  MG capsule Take 1 capsule (300 mg total) by mouth 2 (two) times daily. 01/13/14  Yes Josetta Huddle, MD  terazosin (HYTRIN) 2 MG capsule Take 4 mg by mouth daily.   Yes Historical Provider, MD  vitamin B-12 (CYANOCOBALAMIN) 1000 MCG tablet Take 1,000 mcg by mouth daily.   Yes Historical Provider, MD  zolpidem (AMBIEN) 5 MG tablet Take 5 mg by mouth at bedtime as needed for sleep.   Yes Historical Provider, MD   BP 155/85 mmHg  Pulse 102  Temp(Src) 98.5 F (36.9 C) (Oral)  Resp 18  SpO2 95% Physical Exam  Constitutional: He is oriented to person, place, and time. He appears well-developed and well-nourished. No distress.  HENT:  Head: Normocephalic.  Eyes: Conjunctivae are normal. Pupils are equal, round, and reactive to light. No scleral icterus.  Neck: Normal range of motion. Neck supple. No thyromegaly present.  Cardiovascular: Normal rate and regular rhythm.  Exam reveals no gallop and no friction rub.   No murmur heard. Pulmonary/Chest: Effort normal and breath sounds normal. No respiratory distress. He has no wheezes. He has no rales.  Abdominal: Soft. Bowel sounds are normal. He exhibits no distension. There is no tenderness. There is no rebound.  Musculoskeletal: Normal range of motion.  Neurological: He is alert and oriented to person, place, and time.  As a Bell's phenomenon. Cannot completely close the lid of the left eye. Obvious left facial droop. No gag reflex. Reports normal vision. No pronator drift. No leg drift or weakness.  Skin: Skin is warm and dry. No rash noted.  Psychiatric: He has a normal mood and affect. His behavior is normal.    ED Course  Procedures (including critical care time) Labs Review Labs Reviewed  CBC - Abnormal; Notable for the following:    WBC 11.0 (*)    RBC 6.27 (*)    MCV 69.1 (*)    MCH 21.5 (*)    All other components within normal limits  DIFFERENTIAL - Abnormal; Notable for the following:    Neutro Abs 8.0 (*)    All other  components within normal limits  COMPREHENSIVE METABOLIC PANEL - Abnormal; Notable for the following:    Glucose, Bld 129 (*)    GFR calc non Af Amer 54 (*)    GFR calc Af Amer 63 (*)    All other components within normal limits  PROTIME-INR  APTT  HEMOGLOBIN A1C  LIPID PANEL  CBC  COMPREHENSIVE METABOLIC PANEL  TSH  VITAMIN B12  SEDIMENTATION RATE  ANA, BODY FLUID  ACETYLCHOLINE RECEPTOR, BINDING  STRIATED MUSCLE ANTIBODY  MYASTHENIA GRAVIS PANEL 2  I-STAT TROPOININ, ED    Imaging Review Ct Head (brain) Wo Contrast  03/14/2015   CLINICAL DATA:  Stroke symptoms.  Facial tingling and slurred speech  EXAM: CT HEAD WITHOUT CONTRAST  TECHNIQUE: Contiguous  axial images were obtained from the base of the skull through the vertex without intravenous contrast.  COMPARISON:  CT head 12/06/2014  FINDINGS: Generalized atrophy. Negative for hydrocephalus. Moderate chronic microvascular ischemic changes in the white matter. Chronic infarct right inferior cerebellum.  Negative for acute infarct. Negative for hemorrhage or mass. Subdural hygroma around the right cerebellar hemisphere unchanged from prior studies  Calvarium intact.  IMPRESSION: Atrophy and chronic ischemia.  No acute abnormality.   Electronically Signed   By: Franchot Gallo M.D.   On: 03/14/2015 20:21   Mr Brain Wo Contrast  03/15/2015   CLINICAL DATA:  Initial evaluation for numbness and tingling tooth face with slurred speech.  EXAM: MRI HEAD WITHOUT CONTRAST  TECHNIQUE: Multiplanar, multiecho pulse sequences of the brain and surrounding structures were obtained without intravenous contrast.  COMPARISON:  Prior CT from earlier the same day.  FINDINGS: Diffuse prominence of the CSF containing spaces is compatible with generalized cerebral atrophy. Patchy and confluent T2/FLAIR hyperintensity within the periventricular and deep white matter both cerebral hemispheres most consistent with chronic small vessel ischemic disease. There are  remote lacunar infarcts involving the periventricular white matter bilaterally. Remote right cerebellar infarct present. Smaller are remote left cerebellar infarcts present.  There is a small focus of restricted diffusion measuring approximately 5 mm 6 mm involving the cortical gray matter of the high left frontal lobe in the region of the motor strip (series 3, image 42). Finding most consistent with a small acute ischemic infarct. This appears more linear in nature on corresponding coronal DWI sequence (series 5, image 14). No associated hemorrhage or mass effect. No other acute ischemic infarct. Normal intravascular flow voids are maintained. No mass lesion or midline shift. No mass effect. No hydrocephalus. No extra-axial fluid collection.  Craniocervical junction within normal limits. Pituitary gland normal. No acute abnormality seen about the orbits. Paranasal sinuses are clear. Minimal fluid signal intensity noted within the left mastoid air cells. Inner ear structures normal.  Bone marrow signal intensity within normal limits. No scalp soft tissue abnormality.  IMPRESSION: 1. Small 6 mm acute ischemic infarct involving the cortical gray matter of the high left frontal lobe as above. No associated hemorrhage or mass effect. 2. No other acute intracranial process identified. 3. Advanced cerebral atrophy with chronic microvascular ischemic disease and multiple remote infarcts involving the periventricular white matter bilateral cerebellar hemispheres.   Electronically Signed   By: Jeannine Boga M.D.   On: 03/15/2015 00:07     EKG Interpretation None      MDM   Final diagnoses:  Bell's palsy  Dysphagia   A shared with left upper, and lower facial weakness suggestive of peripheral seventh thirst palsy. However marked dysphasia and minimal gag reflex. I discussed the case with Dr. Doy Mince of neurology. She is evaluated the patient and requests further studies with imaging and MRI. Discussed  the case with the hospitalists. Patient will be admitted. MRI. SL pee exam for his dysphasia.    Tanna Furry, MD 03/15/15 [phone]

## 2015-03-15 NOTE — Progress Notes (Signed)
SLP Cancellation Note  Patient Details Name: Jared Hall MRN: [id number] DOB: December 03, 1930   Cancelled treatment:       Reason Eval/Treat Not Completed: Patient at procedure or test/unavailable. Attempted to see pt x3. Pt toileting, bathing with RN, then out of room. Will return as able.    Durga Saldarriaga, Katherene Ponto 03/15/2015, 10:37 AM

## 2015-03-15 NOTE — Progress Notes (Addendum)
PATIENT DETAILS Name: Jared Hall Age: 81 y.o. Sex: male Date of Birth: 1930/12/31 Admit Date: 03/14/2015 Admitting Physician Jani Gravel, MD ZOX:[id number] NEVILL, MD  Subjective: Continues to have mild dysarthria and difficulty swallowing.  Assessment/Plan: Active Problems:   Left sided Bell's palsy: Admitted with left facial droop, difficulty speaking and swallowing. CT head negative for acute abnormalities, MRI of the brain (personally reviewed images) shows a incidental left frontal infarct-that does not explain patient's symptoms. Seen by neurology, started on valtrex and prednisone for 7 days from 4/25. Await SLP/PT eval.    Incidental finding of left frontal infarct MRI brain/prior history of CVA: Continue aspirin/Plavix. LDL at 68- at goal. A1c pending. Does have a history of paroxysmal atrial fibrillation and at one point appears to have been on anticoagulation with warfarin (personally reviewed records from prior hospitalization)-this M.D. spoke with patient's PCP-Dr. Mertha Finders over the phone, who suggested that we continue aspirin and Plavix and not start anticoagulation as this patient is a high fall risk (reason why anticoagulation discontinued)    Chronic lower extremity edema with wounds: Chronic issue, have consulted wound care. On Unna boots.    Essential hypertension, benign: Continue with Hytrin and Lasix. Follow and titrate accordingly    DM type 2 with diabetic peripheral neuropathy: CBGs currently stable-apparently takes 80 units of Lantus twice a day at home-continue ask his eye, will start with 20 units of Lantus since sugars are not significantly elevated. Will follow and adjust accordingly. Continue Lyrica for neuropathy    Chronic diastolic heart failure: Clinically compensated, continue Lasix.EF per 2009 echo 60%.  Follow.  Disposition: Remain inpatient-Home when work up complete-suspect 4/26  Antimicrobial agents  See  below  Anti-infectives    Start     Dose/Rate Route Frequency Ordered Stop   03/15/15 1600  valACYclovir (VALTREX) tablet 1,000 mg     1,000 mg Oral 3 times daily 03/15/15 1300 03/22/15 1559   03/15/15 0100  rifampin (RIFADIN) capsule 300 mg     300 mg Oral 2 times daily 03/14/15 2349        DVT Prophylaxis: Prophylactic Heparin   Code Status: Full code   Family Communication None at bedside  Procedures: None  CONSULTS:  neurology  Time spent 40 minutes-which includes 50% of the time with face-to-face with patient/ family and coordinating care with neurology and PCP related to the above assessment and plan.  MEDICATIONS: Scheduled Meds: .  stroke: mapping our early stages of recovery book   Does not apply Once  . aspirin EC  81 mg Oral Daily  . clopidogrel  75 mg Oral Daily  . docusate sodium  100 mg Oral BID  . furosemide  40 mg Oral Daily  . insulin aspart  0-9 Units Subcutaneous TID WC  . potassium chloride SA  20 mEq Oral Daily  . prednisoLONE  60 mg Oral Daily  . pregabalin  50 mg Oral BID  . rifampin  300 mg Oral BID  . sodium chloride  3 mL Intravenous Q12H  . terazosin  4 mg Oral Daily  . valACYclovir  1,000 mg Oral TID  . vitamin B-12  1,000 mcg Oral Daily   Continuous Infusions:  PRN Meds:.sodium chloride, HYDROcodone-acetaminophen, RESOURCE THICKENUP CLEAR, sodium chloride, zolpidem    PHYSICAL EXAM: Vital signs in last 24 hours: Filed Vitals:   03/14/15 2350 03/15/15 0157 03/15/15 0351 03/15/15 0607  BP: 155/85  174/82 171/75 154/73  Pulse: 102 103 99   Temp: 98.5 F (36.9 C) 97.8 F (36.6 C) 97.7 F (36.5 C) 97.8 F (36.6 C)  TempSrc: Oral Oral Oral Oral  Resp: 18 22 22 20   SpO2: 95% 96% 95% 96%    Weight change:  There were no vitals filed for this visit. There is no weight on file to calculate BMI.   Gen Exam: Awake and alert. Left facial droop, mild dysarthria. Neck: Supple, No JVD.   Chest: B/L Clear.   CVS: S1 S2 Regular, no  murmurs.  Abdomen: soft, BS +, non tender, non distended.  Extremities:.Unna boots in place Neurologic: Non Focal.   Skin: No Rash.   Wounds: N/A.   Intake/Output from previous day: No intake or output data in the 24 hours ending 03/15/15 1440   LAB RESULTS: CBC  Recent Labs Lab 03/14/15 1935 03/15/15 0705  WBC 11.0* 10.9*  HGB 13.5 13.9  HCT 43.3 43.8  PLT 188 192  MCV 69.1* 68.2*  MCH 21.5* 21.7*  MCHC 31.2 31.7  RDW 15.5 15.4  LYMPHSABS 1.9  --   MONOABS 0.9  --   EOSABS 0.2  --   BASOSABS 0.0  --     Chemistries   Recent Labs Lab 03/14/15 1935 03/15/15 0705  NA 139 138  K 4.2 3.9  CL 102 99  CO2 26 29  GLUCOSE 129* 118*  BUN 17 14  CREATININE 1.21 1.11  CALCIUM 9.1 9.3    CBG:  Recent Labs Lab 03/15/15 0004 03/15/15 0358  GLUCAP 94 95    GFR CrCl cannot be calculated (Unknown ideal weight.).  Coagulation profile  Recent Labs Lab 03/14/15 1935  INR 1.04    Cardiac Enzymes No results for input(s): CKMB, TROPONINI, MYOGLOBIN in the last 168 hours.  Invalid input(s): CK  Invalid input(s): POCBNP No results for input(s): DDIMER in the last 72 hours. No results for input(s): HGBA1C in the last 72 hours.  Recent Labs  03/15/15 0705  CHOL 140  HDL 37*  LDLCALC 68  TRIG 177*  CHOLHDL 3.8    Recent Labs  03/15/15 0705  TSH 1.253   No results for input(s): VITAMINB12, FOLATE, FERRITIN, TIBC, IRON, RETICCTPCT in the last 72 hours. No results for input(s): LIPASE, AMYLASE in the last 72 hours.  Urine Studies No results for input(s): UHGB, CRYS in the last 72 hours.  Invalid input(s): UACOL, UAPR, USPG, UPH, UTP, UGL, UKET, UBIL, UNIT, UROB, ULEU, UEPI, UWBC, URBC, UBAC, CAST, UCOM, BILUA  MICROBIOLOGY: No results found for this or any previous visit (from the past 240 hour(s)).  RADIOLOGY STUDIES/RESULTS: Ct Head (brain) Wo Contrast  03/14/2015   CLINICAL DATA:  Stroke symptoms.  Facial tingling and slurred speech  EXAM:  CT HEAD WITHOUT CONTRAST  TECHNIQUE: Contiguous axial images were obtained from the base of the skull through the vertex without intravenous contrast.  COMPARISON:  CT head 12/06/2014  FINDINGS: Generalized atrophy. Negative for hydrocephalus. Moderate chronic microvascular ischemic changes in the white matter. Chronic infarct right inferior cerebellum.  Negative for acute infarct. Negative for hemorrhage or mass. Subdural hygroma around the right cerebellar hemisphere unchanged from prior studies  Calvarium intact.  IMPRESSION: Atrophy and chronic ischemia.  No acute abnormality.   Electronically Signed   By: Franchot Gallo M.D.   On: 03/14/2015 20:21   Mr Brain Wo Contrast  03/15/2015   CLINICAL DATA:  Initial evaluation for numbness and tingling tooth face with slurred  speech.  EXAM: MRI HEAD WITHOUT CONTRAST  TECHNIQUE: Multiplanar, multiecho pulse sequences of the brain and surrounding structures were obtained without intravenous contrast.  COMPARISON:  Prior CT from earlier the same day.  FINDINGS: Diffuse prominence of the CSF containing spaces is compatible with generalized cerebral atrophy. Patchy and confluent T2/FLAIR hyperintensity within the periventricular and deep white matter both cerebral hemispheres most consistent with chronic small vessel ischemic disease. There are remote lacunar infarcts involving the periventricular white matter bilaterally. Remote right cerebellar infarct present. Smaller are remote left cerebellar infarcts present.  There is a small focus of restricted diffusion measuring approximately 5 mm 6 mm involving the cortical gray matter of the high left frontal lobe in the region of the motor strip (series 3, image 42). Finding most consistent with a small acute ischemic infarct. This appears more linear in nature on corresponding coronal DWI sequence (series 5, image 14). No associated hemorrhage or mass effect. No other acute ischemic infarct. Normal intravascular flow voids  are maintained. No mass lesion or midline shift. No mass effect. No hydrocephalus. No extra-axial fluid collection.  Craniocervical junction within normal limits. Pituitary gland normal. No acute abnormality seen about the orbits. Paranasal sinuses are clear. Minimal fluid signal intensity noted within the left mastoid air cells. Inner ear structures normal.  Bone marrow signal intensity within normal limits. No scalp soft tissue abnormality.  IMPRESSION: 1. Small 6 mm acute ischemic infarct involving the cortical gray matter of the high left frontal lobe as above. No associated hemorrhage or mass effect. 2. No other acute intracranial process identified. 3. Advanced cerebral atrophy with chronic microvascular ischemic disease and multiple remote infarcts involving the periventricular white matter bilateral cerebellar hemispheres.   Electronically Signed   By: Jeannine Boga M.D.   On: 03/15/2015 00:07   Mr Jodene Nam Head/brain Wo Cm  03/15/2015   CLINICAL DATA:  81 year old male with facial weakness, trouble eating and swallowing. Recently detected small left frontal lobe infarct on 03/14/2015.  EXAM: MRA HEAD WITHOUT CONTRAST  TECHNIQUE: Angiographic images of the Circle of Willis were obtained using MRA technique without intravenous contrast.  COMPARISON:  Brain MRI 03/14/2015.  Head CT 03/14/2015 and earlier  FINDINGS: Antegrade flow in the posterior circulation with dominant distal left vertebral artery. Small distal right vertebral artery appears to remain patent, including the right PICA origin.  Patent basilar artery without stenosis. Bilateral fetal type PCA origins. Shared left P1 and SCA origin. These are patent. Bilateral PCA branches are within normal limits.  Tortuous distal cervical right ICA. Antegrade flow in both ICA siphons with no siphon stenosis. Normal ophthalmic and posterior communicating artery origins. Patent carotid termini. Normal MCA and ACA origins. Dominant left A1 segment. Anterior  communicating artery and visualized bilateral ACA branches are within normal limits. Right MCA branches are within normal limits.  Patent left MCA M1 segment and bifurcation. Visualized left MCA branches are within normal limits.  IMPRESSION: Negative intracranial MRA.   Electronically Signed   By: Genevie Ann M.D.   On: 03/15/2015 11:34    Oren Binet, MD  [facility] Pager:[phone]  If 7PM-7AM, please contact night-coverage [email or web address] Password [email or web address] 03/15/2015, 2:40 PM   LOS: 1 day

## 2015-03-15 NOTE — Progress Notes (Deleted)
PATIENT DETAILS Name: Jared Hall Age: 81 y.o. Sex: male Date of Birth: 08-29-1931 Admit Date: 03/14/2015 Admitting Physician Jani Gravel, MD TMH:[id number] NEVILL, MD  Subjective: Continued L facial droop and L sided weakness. He is continuing to complain of a L sided HA and pain behind his L ear with decreased hearing.  Assessment/Plan: Active Problems:   Essential hypertension, benign   DM type 2 with diabetic peripheral neuropathy   Dysphagia   Dysarthria   Stroke  L Bell's Palsy: Believed to be the source of L sided facial droop, drooling and inability to swallow. Pt believes symptoms are improving. Started on Valcyclovir and prednisolone. Awaiting speech therapy swallow study.   CVA: Hx of a-fib and embolic stroke in 1194. CT head negative, MRI brain revealed acute 14mm infarct of L frontal lobe. Believed to be an incidental finding and not the cause of pt's symptoms.  LDL 68. A1c, doppler and echo pending. Awaiting speech, PT and OT consults. Neurology seen and recommends anticoagulation, will begin pt on ____  HTN: Pt on no home medications, moderately controlled. Continue to monitor.   Paroxysmal A-fib: Noted on d/c summaries from 2010-2012 but no longer listed in patients medical hx. Pt was taking aspirin and plavix at home. EKGs show no afib while admitted. Continue to monitor and recommend cardiology follow up.   DM2: Blood sugars stable while admitted. Awaiting A1c. Continue on sliding scale insulin.   Chronic LE wounds: Unaboots on bilateral lower extremities. Based off previous notes appear to be due to venous insufficiency wounds. Wound care is following. Wraps appear to be clean and patient states he had them changed this past Thursday, has them changed once a week.   Diabetic Neuropathy Bilateral Lower Extremities: No complaints at this time, continue on Lyrica for pain.   CHF: Denies CP at this time. EF per 2009 echo 60%. Current echo pending.    HLD: No home medications, LDL 68.   Disposition: Remain inpatient  Antimicrobial agents  See below  Anti-infectives    Start     Dose/Rate Route Frequency Ordered Stop   03/15/15 1600  valACYclovir (VALTREX) tablet 1,000 mg     1,000 mg Oral 3 times daily 03/15/15 1300 03/22/15 1559   03/15/15 0100  rifampin (RIFADIN) capsule 300 mg     300 mg Oral 2 times daily 03/14/15 2349        DVT Prophylaxis: On Plavix.   Code Status: Full code  Family Communication None at bedside  Procedures: None  CONSULTS:  neurology  MEDICATIONS: Scheduled Meds: .  stroke: mapping our early stages of recovery book   Does not apply Once  . aspirin EC  81 mg Oral Daily  . clopidogrel  75 mg Oral Daily  . docusate sodium  100 mg Oral BID  . furosemide  40 mg Oral Daily  . insulin aspart  0-9 Units Subcutaneous TID WC  . potassium chloride SA  20 mEq Oral Daily  . prednisoLONE  60 mg Oral Daily  . pregabalin  50 mg Oral BID  . rifampin  300 mg Oral BID  . sodium chloride  3 mL Intravenous Q12H  . terazosin  4 mg Oral Daily  . valACYclovir  1,000 mg Oral TID  . vitamin B-12  1,000 mcg Oral Daily   Continuous Infusions:  PRN Meds:.sodium chloride, HYDROcodone-acetaminophen, sodium chloride, zolpidem    PHYSICAL EXAM: Vital signs  in last 24 hours: Filed Vitals:   03/14/15 2350 03/15/15 0157 03/15/15 0351 03/15/15 0607  BP: 155/85 174/82 171/75 154/73  Pulse: 102 103 99   Temp: 98.5 F (36.9 C) 97.8 F (36.6 C) 97.7 F (36.5 C) 97.8 F (36.6 C)  TempSrc: Oral Oral Oral Oral  Resp: 18 22 22 20   SpO2: 95% 96% 95% 96%    Weight change:  There were no vitals filed for this visit. There is no weight on file to calculate BMI.   Gen Exam: Awake and alert with clear speech. Neck: Supple, No JVD.   Chest: B/L Clear.   CVS: S1 S2 Regular, no murmurs.  Abdomen: soft, BS +, non tender, non distended.  Extremities: no edema, lower extremities warm to touch. Neurologic: L  sided facial droop, unable to close L eyelid.  Skin: No Rash.   Wounds: N/A.    Intake/Output from previous day: No intake or output data in the 24 hours ending 03/15/15 1340   LAB RESULTS: CBC  Recent Labs Lab 03/14/15 1935 03/15/15 0705  WBC 11.0* 10.9*  HGB 13.5 13.9  HCT 43.3 43.8  PLT 188 192  MCV 69.1* 68.2*  MCH 21.5* 21.7*  MCHC 31.2 31.7  RDW 15.5 15.4  LYMPHSABS 1.9  --   MONOABS 0.9  --   EOSABS 0.2  --   BASOSABS 0.0  --     Chemistries   Recent Labs Lab 03/14/15 1935 03/15/15 0705  NA 139 138  K 4.2 3.9  CL 102 99  CO2 26 29  GLUCOSE 129* 118*  BUN 17 14  CREATININE 1.21 1.11  CALCIUM 9.1 9.3    CBG:  Recent Labs Lab 03/15/15 0004 03/15/15 0358  GLUCAP 94 95    GFR CrCl cannot be calculated (Unknown ideal weight.).  Coagulation profile  Recent Labs Lab 03/14/15 1935  INR 1.04    Cardiac Enzymes No results for input(s): CKMB, TROPONINI, MYOGLOBIN in the last 168 hours.  Invalid input(s): CK  Invalid input(s): POCBNP No results for input(s): DDIMER in the last 72 hours. No results for input(s): HGBA1C in the last 72 hours.  Recent Labs  03/15/15 0705  CHOL 140  HDL 37*  LDLCALC 68  TRIG 177*  CHOLHDL 3.8    Recent Labs  03/15/15 0705  TSH 1.253   No results for input(s): VITAMINB12, FOLATE, FERRITIN, TIBC, IRON, RETICCTPCT in the last 72 hours. No results for input(s): LIPASE, AMYLASE in the last 72 hours.  Urine Studies No results for input(s): UHGB, CRYS in the last 72 hours.  Invalid input(s): UACOL, UAPR, USPG, UPH, UTP, UGL, UKET, UBIL, UNIT, UROB, ULEU, UEPI, UWBC, URBC, UBAC, CAST, UCOM, BILUA  MICROBIOLOGY: No results found for this or any previous visit (from the past 240 hour(s)).  RADIOLOGY STUDIES/RESULTS: Ct Head (brain) Wo Contrast  03/14/2015   CLINICAL DATA:  Stroke symptoms.  Facial tingling and slurred speech  EXAM: CT HEAD WITHOUT CONTRAST  TECHNIQUE: Contiguous axial images were  obtained from the base of the skull through the vertex without intravenous contrast.  COMPARISON:  CT head 12/06/2014  FINDINGS: Generalized atrophy. Negative for hydrocephalus. Moderate chronic microvascular ischemic changes in the white matter. Chronic infarct right inferior cerebellum.  Negative for acute infarct. Negative for hemorrhage or mass. Subdural hygroma around the right cerebellar hemisphere unchanged from prior studies  Calvarium intact.  IMPRESSION: Atrophy and chronic ischemia.  No acute abnormality.   Electronically Signed   By: Franchot Gallo M.D.  On: 03/14/2015 20:21   Mr Brain Wo Contrast  03/15/2015   CLINICAL DATA:  Initial evaluation for numbness and tingling tooth face with slurred speech.  EXAM: MRI HEAD WITHOUT CONTRAST  TECHNIQUE: Multiplanar, multiecho pulse sequences of the brain and surrounding structures were obtained without intravenous contrast.  COMPARISON:  Prior CT from earlier the same day.  FINDINGS: Diffuse prominence of the CSF containing spaces is compatible with generalized cerebral atrophy. Patchy and confluent T2/FLAIR hyperintensity within the periventricular and deep white matter both cerebral hemispheres most consistent with chronic small vessel ischemic disease. There are remote lacunar infarcts involving the periventricular white matter bilaterally. Remote right cerebellar infarct present. Smaller are remote left cerebellar infarcts present.  There is a small focus of restricted diffusion measuring approximately 5 mm 6 mm involving the cortical gray matter of the high left frontal lobe in the region of the motor strip (series 3, image 42). Finding most consistent with a small acute ischemic infarct. This appears more linear in nature on corresponding coronal DWI sequence (series 5, image 14). No associated hemorrhage or mass effect. No other acute ischemic infarct. Normal intravascular flow voids are maintained. No mass lesion or midline shift. No mass effect. No  hydrocephalus. No extra-axial fluid collection.  Craniocervical junction within normal limits. Pituitary gland normal. No acute abnormality seen about the orbits. Paranasal sinuses are clear. Minimal fluid signal intensity noted within the left mastoid air cells. Inner ear structures normal.  Bone marrow signal intensity within normal limits. No scalp soft tissue abnormality.  IMPRESSION: 1. Small 6 mm acute ischemic infarct involving the cortical gray matter of the high left frontal lobe as above. No associated hemorrhage or mass effect. 2. No other acute intracranial process identified. 3. Advanced cerebral atrophy with chronic microvascular ischemic disease and multiple remote infarcts involving the periventricular white matter bilateral cerebellar hemispheres.   Electronically Signed   By: Jeannine Boga M.D.   On: 03/15/2015 00:07   Mr Jodene Nam Head/brain Wo Cm  03/15/2015   CLINICAL DATA:  81 year old male with facial weakness, trouble eating and swallowing. Recently detected small left frontal lobe infarct on 03/14/2015.  EXAM: MRA HEAD WITHOUT CONTRAST  TECHNIQUE: Angiographic images of the Circle of Willis were obtained using MRA technique without intravenous contrast.  COMPARISON:  Brain MRI 03/14/2015.  Head CT 03/14/2015 and earlier  FINDINGS: Antegrade flow in the posterior circulation with dominant distal left vertebral artery. Small distal right vertebral artery appears to remain patent, including the right PICA origin.  Patent basilar artery without stenosis. Bilateral fetal type PCA origins. Shared left P1 and SCA origin. These are patent. Bilateral PCA branches are within normal limits.  Tortuous distal cervical right ICA. Antegrade flow in both ICA siphons with no siphon stenosis. Normal ophthalmic and posterior communicating artery origins. Patent carotid termini. Normal MCA and ACA origins. Dominant left A1 segment. Anterior communicating artery and visualized bilateral ACA branches are  within normal limits. Right MCA branches are within normal limits.  Patent left MCA M1 segment and bifurcation. Visualized left MCA branches are within normal limits.  IMPRESSION: Negative intracranial MRA.   Electronically Signed   By: Genevie Ann M.D.   On: 03/15/2015 11:34   Damaris Hippo, PA-S Oren Binet, MD [facility] Pager:[phone]  If 7PM-7AM, please contact night-coverage [email or web address] Password [email or web address] 03/15/2015, 1:40 PM   LOS: 1 day

## 2015-03-15 NOTE — Progress Notes (Signed)
VASCULAR LAB PRELIMINARY  PRELIMINARY  PRELIMINARY  PRELIMINARY  Carotid duplex  completed.    Preliminary report:  Bilateral:  1-39% ICA stenosis.  Vertebral artery flow is antegrade.  Waveforms pulsatile bilaterally throughout.   Emiko Osorto, RVT 03/15/2015, 4:09 PM

## 2015-03-15 NOTE — Progress Notes (Signed)
Rehab Admissions Coordinator Note:  Patient was screened by Cleatrice Burke for appropriateness for an Inpatient Acute Rehab Consult per PT recommendation.   At this time, we are recommending Inpatient Rehab consult. I will contact MD for order.  Cleatrice Burke 03/15/2015, 5:10 PM  I can be reached at [phone].

## 2015-03-15 NOTE — Consult Note (Addendum)
WOC wound consult note Reason for Consult: Consult requested for bilat Una boots.  Pt states he is followed by home health for Una boots to BLE which are changed Q week. Wound type: Removed previous Una boots to assess legs. There are no open wounds or drainage.  Bilat legs with generalized edema and erythremia. Dressing procedure/placement/frequency: Paged ortho tech to apply Una boots and Coban to control edema to BLE and change Q Mon.  Pt can resume previous plan of care with home health after discharge. Please re-consult if further assistance is needed.  Thank-you,  Julien Girt MSN, [place], [place], [place], [place]

## 2015-03-15 NOTE — Progress Notes (Signed)
Orthopedic Tech Progress Note Patient Details:  Jared Hall Oct 08, 1931 [id number]  Ortho Devices Type of Ortho Device: Haematologist Ortho Device/Splint Location: (B) LE Ortho Device/Splint Interventions: Ordered, Application   Braulio Bosch 03/15/2015, 3:39 PM

## 2015-03-15 NOTE — Progress Notes (Addendum)
STROKE TEAM PROGRESS NOTE   HISTORY Jared Hall is an 81 y.o. male who reports that he awakened yesterday 03/13/2015 (time unknown) at his baseline. He usually sleeps in his recliner and took a nap yesterday afternoon. When he awakened he noted that he was unable to eat because things were falling out of the left side of his mouth and he was choking on his food if he attempted to swallow. He waited until today 03/14/2015 with no improvement in his symptoms. He reports that he is unable to close his left eye. He is numb on the left side of his face. Hies left eye is tearing and he feels a fullness in his left ear. He reports that he has had a stroke in the past, about 14 years ago, with no residual. Patient was not administered TPA secondary to delay in arrival. He was admitted for further evaluation and treatment.   SUBJECTIVE (INTERVAL HISTORY) No family is at the bedside.  Overall he feels his condition is stable. He is lethargic. He reports food is drooling out the left side of his mouth. He reports loss/decreased hearing L ear and also left facial droop was proceeded by left posterior ear pain.    OBJECTIVE Temp:  [97.7 F (36.5 C)-98.5 F (36.9 C)] 97.8 F (36.6 C) (04/25 0607) Pulse Rate:  [92-105] 99 (04/25 0351) Cardiac Rhythm:  [-] Sinus tachycardia (04/25 0846) Resp:  [18-25] 20 (04/25 0607) BP: (148-174)/(73-93) 154/73 mmHg (04/25 0607) SpO2:  [92 %-96 %] 96 % (04/25 0607)   Recent Labs Lab 03/15/15 0004 03/15/15 0358  GLUCAP 94 95    Recent Labs Lab 03/14/15 1935 03/15/15 0705  NA 139 138  K 4.2 3.9  CL 102 99  CO2 26 29  GLUCOSE 129* 118*  BUN 17 14  CREATININE 1.21 1.11  CALCIUM 9.1 9.3    Recent Labs Lab 03/14/15 1935 03/15/15 0705  AST 23 23  ALT 21 20  ALKPHOS 66 68  BILITOT 0.6 0.9  PROT 6.6 7.0  ALBUMIN 3.6 3.6    Recent Labs Lab 03/14/15 1935 03/15/15 0705  WBC 11.0* 10.9*  NEUTROABS 8.0*  --   HGB 13.5 13.9  HCT 43.3 43.8  MCV  69.1* 68.2*  PLT 188 192   No results for input(s): CKTOTAL, CKMB, CKMBINDEX, TROPONINI in the last 168 hours.  Recent Labs  03/14/15 1935  LABPROT 13.7  INR 1.04   No results for input(s): COLORURINE, LABSPEC, PHURINE, GLUCOSEU, HGBUR, BILIRUBINUR, KETONESUR, PROTEINUR, UROBILINOGEN, NITRITE, LEUKOCYTESUR in the last 72 hours.  Invalid input(s): APPERANCEUR     Component Value Date/Time   CHOL 140 03/15/2015 0705   TRIG 177* 03/15/2015 0705   HDL 37* 03/15/2015 0705   CHOLHDL 3.8 03/15/2015 0705   VLDL 35 03/15/2015 0705   LDLCALC 68 03/15/2015 0705   Lab Results  Component Value Date   HGBA1C 8.3* 04/05/2013      Component Value Date/Time   LABOPIA POSITIVE* 12/07/2010 2358   COCAINSCRNUR NONE DETECTED 12/07/2010 2358   LABBENZ NONE DETECTED 12/07/2010 2358   AMPHETMU NONE DETECTED 12/07/2010 2358   THCU NONE DETECTED 12/07/2010 2358   LABBARB  12/07/2010 2358    NONE DETECTED        DRUG SCREEN FOR MEDICAL PURPOSES ONLY.  IF CONFIRMATION IS NEEDED FOR ANY PURPOSE, NOTIFY LAB WITHIN 5 DAYS.        LOWEST DETECTABLE LIMITS FOR URINE DRUG SCREEN Drug Class       Cutoff (  ng/mL) Amphetamine      1000 Barbiturate      200 Benzodiazepine   939 Tricyclics       030 Opiates          300 Cocaine          300 THC              50    No results for input(s): ETH in the last 168 hours.  I have personally reviewed the radiological images below and agree with the radiology interpretations.  Ct Head (brain) Wo Contrast 03/14/2015    Atrophy and chronic ischemia.  No acute abnormality.     MRI Brain Wo Contrast 03/15/2015   1. Small 6 mm acute ischemic infarct involving the cortical gray matter of the high left frontal lobe as above. No associated hemorrhage or mass effect. 2. No other acute intracranial process identified. 3. Advanced cerebral atrophy with chronic microvascular ischemic disease and multiple remote infarcts involving the periventricular white matter  bilateral cerebellar hemispheres.    MRA Brain Wo Contrast 03/15/2015 Unremarkable   CUS - Bilateral: 1-39% ICA stenosis. Vertebral artery flow is antegrade.  2D ehco - pending  PHYSICAL EXAM  Temp:  [97.7 F (36.5 C)-98.9 F (37.2 C)] 98.2 F (36.8 C) (04/25 2109) Pulse Rate:  [99-103] 101 (04/25 2109) Resp:  [17-22] 20 (04/25 2109) BP: (109-174)/(58-106) 122/73 mmHg (04/25 2109) SpO2:  [93 %-96 %] 94 % (04/25 2109)  General - obese, well developed, lethargic.  Ophthalmologic - not cooperative on exam.  Cardiovascular - Regular rate and rhythm.  Mental Status -  Drowsy sleepy, orientation to place, and person were intact, but not orientated to time. Language including expression, naming, repetition, comprehension was assessed and found intact, however, moderate dysarthria.  Cranial Nerves II - XII - II - blinking bilaterally to visual threat. III, IV, VI - Extraocular movements intact. V - Facial sensation intact bilaterally. VII - left peripheral VIl palsy. VIII - Hearing decreased on the left due to cerumen compaction & vestibular intact bilaterally, no vesicles in ear cannel. X - Palate elevates symmetrically, moderate dysarthria). XI - Chin turning & shoulder shrug intact bilaterally. XII - Tongue protrusion intact.  Motor Strength - The patient's strength was 4+/5 in all extremities and pronator drift was absent.  Bulk was normal and fasciculations were absent.   Motor Tone - Muscle tone was assessed at the neck and appendages and was normal.  Reflexes - The patient's reflexes were 1+ in all extremities and he had no pathological reflexes.  Sensory - Light touch, temperature/pinprick were assessed and were symmetrical.    Coordination - The patient had normal movements in the hands and feet with no ataxia or dysmetria.  Tremor was absent.  Gait and Station - not tested due to safety concerns..   ASSESSMENT/PLAN Mr. KOBEE MEDLEN is a 81 y.o. male with history  of stroke, dm2, htn, hyperlipidemia presenting with new onset  Facial droop, dysphagia, dysarthria and loss of hearing & pain L ear. He did not receive IV t-PA due to delay in arrival.   L Bell's Palsy  proceeded by left posterior ear pain  Steroids with prednisone 60mg  for 7 days  valacyclovior 1g tid for 7 days  MRI rule out structural lesion  Left ear cerumen compaction  No vesicles in left ear cannel    Incidental punctate L frontal infarct, likely embolic secondary to known atrial fibrillation   MRI  Incidental punctate L frontal infarct  MRA  Unremarkable   Carotid Doppler  unremarkable   2D Echo  pending   LDL 68  HgbA1c pending  SCDs for VTE prophylaxis Diet NPO time specified. ST to see  aspirin 81 mg orally every day and clopidogrel 75 mg orally every day prior to admission, now on aspirin 81 mg orally every day and clopidogrel 75 mg orally every day, though he remains NPO currently  Therapy recommendations:  pending   Disposition:  pending   Paroxysmal atrial fibrillation   Hx embolic stroke in 6789, no source found  Noted from d/c summaries from 2010 through 2012, on anticoagulation at that time. By 2013, notes no longer reflect the diagnosis and treatment had changed to aspirin and plavix.  Does have hx falls and syncope, but still justify for anticoagulation  PCP concerning pt is high fall risk, however pt not fall enough to justify not to use anticoagulation.  Apparently he has failed dural antiplatelet  Given hx of embolic stroke and recurrent stroke this admission as well as high CHADS2-VASc score, from stroke standpoint, recommend anticoagulation with eliquis.  Hypertension Hx  Home meds:   No meds listed on med rec  Slightly elevated  Hyperlipidemia Hx  Home meds:  No meds listed  LDL 68, at goal < 70  Diabetes  HgbA1c pending , goal < 7.0  Other Stroke Risk Factors  Advanced age  Obesity, There is no weight on file to  calculate BMI.   Hx stroke/TIA - 04/2002 scattered L MCA infarcts, old R cerebellar infarct   Obstructive sleep apnea, on CPAP at home  Other Active Problems  CHF  BPH  Other Pertinent History  Hx prostate cancer  Hospital day # 1  Neurology will sign off. Please call with questions. Pt will follow up with Dr. Erlinda Hong at [facility] in about 2 months. Thanks for the consult.  Rosalin Hawking, MD PhD Stroke Neurology 03/15/2015 11:12 PM     To contact Stroke Continuity provider, please refer to [email or web address] After hours, contact General Neurology

## 2015-03-15 NOTE — Progress Notes (Addendum)
Pt HR sustaining in the 140's, MD notified, EKG completed and in shadow chart, 5 mg IV Metoprolol ordered/administered.  1825:Pt HR now down to 101, MD notified

## 2015-03-15 NOTE — Evaluation (Signed)
Speech Language Pathology Evaluation Patient Details Name: Jared Hall MRN: [id number] DOB: 09/27/1931 Today's Date: 03/15/2015 Time: 1340-1400 SLP Time Calculation (min) (ACUTE ONLY): 20 min  Problem List:  Patient Active Problem List   Diagnosis Date Noted  . Dysphagia 03/14/2015  . Dysarthria 03/14/2015  . Stroke 03/14/2015  . Cellulitis 01/11/2014  . Lower extremity edema 01/11/2014  . Venous stasis dermatitis 01/11/2014  . DM type 2 with diabetic peripheral neuropathy 01/11/2014  . Cellulitis and abscess of leg 01/11/2014  . Cellulitis and abscess 01/11/2014  . Depression 06/15/2013  . GERD (gastroesophageal reflux disease) 06/15/2013  . Constipation 06/15/2013  . Diabetic neuropathy 06/15/2013  . Type I (juvenile type) diabetes mellitus with peripheral circulatory disorders, not stated as uncontrolled(250.71) 05/02/2013  . Essential hypertension, benign 04/07/2013  . AKI (acute kidney injury) 04/04/2013  . Cellulitis of leg 04/04/2013  . Right hip pain 04/04/2013  . Renal insufficiency 12/16/2011  . History of CVA (cerebrovascular accident) 12/16/2011  . Dyslipidemia 12/16/2011  . Obesity (BMI 30-39.9) 12/16/2011  . Umbilical hernia with obstruction-partial on CT scan; easily reducible 12/14/2011  . History of PSVT (paroxysmal supraventricular tachycardia) 12/14/2011  . DM (diabetes mellitus) 12/14/2011   Past Medical History:  Past Medical History  Diagnosis Date  . Umbilical hernia   . Hypertension   . High cholesterol   . Type II diabetes mellitus   . Stroke ~ 2005    denies residual on 2/123/2015  . Prostate cancer     S/P "8 weeks of radiation"  . CHF (congestive heart failure)   . Onychomycosis   . BPH (benign prostatic hyperplasia)   . Urinary urgency     with incontinence  . Lower extremity edema   . Prostatitis     recurrent  . Sleep apnea   . Alpha thalassemia    Past Surgical History:  Past Surgical History  Procedure Laterality Date  .  Ventral hernia repair  12/18/2011    Procedure: HERNIA REPAIR VENTRAL ADULT;  Surgeon: Adin Hector, MD;  Location: [facility];  Service: General;  Laterality: N/A;  . Hernia repair    . Vasectomy     HPI:  Jared Hall is an 81 y.o. male who reports that he was unable to eat because things were falling out of the left side of his mouth and he was choking on his food if he attempted to swallow. He reports that he is unable to close his left eye. He is numb on the left side of his face. His left eye is tearing and he feels a fullness in his left ear. He reports that he has had a stroke in the past, about 14 years ago, with no residual. 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 MRI shows Small 6 mm acute ischemic infarct involving the cortical gray matter of the high left frontal lobe in the region of the motor strip. 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 Lingual function WNL, pt mostly intellgibile though improved with slow rate and louder speech. Pt also with apperance of mild memory impairment requiring occasinal cues for basic information. Likely not acute finding. SLP will f/u for speech intelligibility.     SLP Assessment  Patient needs continued [facility]  Follow Up Recommendations  Other (comment)    Frequency and Duration min 1 x/week  2 weeks   Pertinent Vitals/Pain Pain Assessment: No/denies pain   SLP Goals  Progression toward goals: Progressing toward goals Potential to Achieve Goals (ACUTE ONLY): Good  SLP Evaluation Prior Functioning  Cognitive/Linguistic Baseline: Information not available Type of Home: House   Cognition  Overall Cognitive Status: No family/caregiver present to determine baseline cognitive  functioning Arousal/Alertness: Awake/alert Orientation Level: Oriented X4 Attention: Focused;Sustained;Alternating Focused Attention: Appears intact Sustained Attention: Appears intact Alternating Attention: Appears intact Memory: Impaired Memory Impairment: Storage deficit;Decreased short term memory;Decreased recall of new information Decreased Short Term Memory: Verbal basic;Functional basic Awareness: Appears intact Problem Solving: Impaired Problem Solving Impairment: Functional complex    Comprehension  Auditory Comprehension Overall Auditory Comprehension: Appears within functional limits for tasks assessed    Expression Verbal Expression Overall Verbal Expression: Appears within functional limits for tasks assessed   Oral / Motor Oral Motor/Sensory Function Overall Oral Motor/Sensory Function: Impaired Labial ROM: Reduced left Labial Symmetry: Abnormal symmetry left Labial Strength: Reduced Labial Sensation: Reduced Lingual ROM: Within Functional Limits Lingual Symmetry: Within Functional Limits Lingual Strength: Within Functional Limits Lingual Sensation: Within Functional Limits Facial ROM: Reduced left Facial Symmetry: Left droop;Left drooping eyelid Facial Strength: Reduced Facial Sensation: Reduced Velum: Within Functional Limits Mandible: Within Functional Limits Motor Speech Overall Motor Speech: Impaired Respiration: Within functional limits Phonation: Normal Resonance: Within functional limits Articulation: Impaired Level of Impairment: Word Intelligibility: Intelligible Motor Planning: Witnin functional limits Motor Speech Errors: Aware Effective Techniques: Over-articulate;Slow rate;Increased vocal intensity   GO    [name], MA CCC-SLP [phone]  Lynann [facility] 03/15/2015, 2:41 PM

## 2015-03-15 NOTE — Progress Notes (Signed)
Physical Therapy Evaluation Patient Details Name: Jared Hall MRN: [id number] DOB: March 09, 1931 Today's Date: 03/15/2015   History of Present Illness  Jared Hall is an 81 y.o. male who reports that he awakened yesterday 03/13/2015 (time unknown) at his baseline. He usually sleeps in his recliner and took a nap yesterday afternoon. When he awakened he noted that he was unable to eat because things were falling out of the left side of his mouth and he was choking on his food if he attempted to swallow. He waited until today 03/14/2015 with no improvement in his symptoms. He reports that he is unable to close his left eye. He is numb on the left side of his face. Hies left eye is tearing and he feels a fullness in his left ear. He reports that he has had a stroke in the past, about 14 years ago, with no residual.  Clinical Impression  Pt pleasant 81 yo male presenting with above. Pt with generalized weakness and balance impairment in addition to expressive difficulties and mild cognitive impairments. Pt reports he lives alone however nurse has the number for his wife. Pt was functioning at mod I with RW PTA. With CIR upon d/c anticipate pt to be able to achieve safe mod I level of function for safe transition home s/p CIR.    Follow Up Recommendations CIR    Equipment Recommendations  None recommended by PT    Recommendations for Other Services Rehab consult     Precautions / Restrictions Precautions Precautions: Fall Restrictions Weight Bearing Restrictions: No      Mobility  Bed Mobility Overal bed mobility: Needs Assistance Bed Mobility: Supine to Sit     Supine to sit: Min assist     General bed mobility comments: definite use of bed rail. used rocking momentum to pull self up , assist for trunk elevation  Transfers Overall transfer level: Needs assistance Equipment used: Rolling walker (2 wheeled) Transfers: Sit to/from Stand Sit to Stand: Min assist          General transfer comment: v/c's to prevent falling forward. v/c's to push up from bed  Ambulation/Gait Ambulation/Gait assistance: Min assist Ambulation Distance (Feet): 60 Feet Assistive device: Rolling walker (2 wheeled) Gait Pattern/deviations: Step-through pattern;Decreased stride length;Trunk flexed Gait velocity: slow   General Gait Details: nurse tech for chair follow, v/c's to stand up right, increased bilat UE WBing on UE on RW, + SOB requiring a seated rest break  Stairs            Wheelchair Mobility    Modified Rankin (Stroke Patients Only) Modified Rankin (Stroke Patients Only) Pre-Morbid Rankin Score: Slight disability Modified Rankin: Moderately severe disability     Balance Overall balance assessment: Needs assistance Sitting-balance support: Feet supported;Bilateral upper extremity supported Sitting balance-Leahy Scale: Fair     Standing balance support: Bilateral upper extremity supported Standing balance-Leahy Scale: Poor Standing balance comment: needs RW to maintain safe standing                             Pertinent Vitals/Pain Pain Assessment: No/denies pain    Home Living Family/patient expects to be discharged to:: Private residence Living Arrangements: Alone (pt reports lives alone however RN reports he has a wife)   Type of Home: House Home Access: Ramped entrance     Home Layout: One level Home Equipment: Environmental consultant - 4 wheels;Walker - 2 wheels      Prior  Function Level of Independence: Independent with assistive device(s) (uses RW)         Comments: pt reports daughter-in-law to do grocery shopping and driving.     Hand Dominance        Extremity/Trunk Assessment   Upper Extremity Assessment: Overall WFL for tasks assessed           Lower Extremity Assessment: Generalized weakness      Cervical / Trunk Assessment: Normal  Communication   Communication: Expressive difficulties;HOH  Cognition  Arousal/Alertness: Awake/alert Behavior During Therapy: WFL for tasks assessed/performed Overall Cognitive Status: Impaired/Different from baseline Area of Impairment: Safety/judgement;Following commands;Problem solving       Following Commands: Follows one step commands with increased time Safety/Judgement: Decreased awareness of safety;Decreased awareness of deficits   Problem Solving: Difficulty sequencing;Slow processing General Comments: v/c's for walker safety, impulsive when getting up from bed tow alker    General Comments      Exercises        Assessment/Plan    PT Assessment Patient needs continued PT services  PT Diagnosis Difficulty walking;Abnormality of gait;Generalized weakness;Acute pain   PT Problem List Decreased strength;Decreased range of motion;Decreased activity tolerance;Decreased balance;Decreased mobility  PT Treatment Interventions DME instruction;Gait training;Functional mobility training;Therapeutic activities;Therapeutic exercise;Balance training;Neuromuscular re-education   PT Goals (Current goals can be found in the Care Plan section) Acute Rehab PT Goals Patient Stated Goal: to eat PT Goal Formulation: With patient Time For Goal Achievement: 03/29/15 Potential to Achieve Goals: Good    Frequency Min 4X/week   Barriers to discharge Decreased caregiver support lives alone    Co-evaluation               End of Session Equipment Utilized During Treatment: Gait belt Activity Tolerance: Patient tolerated treatment well Patient left: in chair;with call bell/phone within reach;with chair alarm set Nurse Communication: Mobility status         Time: 6948-5462 PT Time Calculation (min) (ACUTE ONLY): 24 min   Charges:   PT Evaluation $Initial PT Evaluation Tier I: 1 Procedure PT Treatments $Gait Training: 8-22 mins   PT G CodesKingsley Callander 03/15/2015, 2:14 PM   Kittie Plater, PT, DPT Pager #:  [phone] Office #: [phone]

## 2015-03-16 ENCOUNTER — Inpatient Hospital Stay (HOSPITAL_COMMUNITY): Payer: Medicare Other

## 2015-03-16 DIAGNOSIS — I471 Supraventricular tachycardia: Secondary | ICD-10-CM

## 2015-03-16 LAB — HEMOGLOBIN A1C
HEMOGLOBIN A1C: 8.5 % — AB (ref 4.8–5.6)
Mean Plasma Glucose: 197 mg/dL

## 2015-03-16 LAB — CBC
HEMATOCRIT: 45.9 % (ref 39.0–52.0)
Hemoglobin: 14.7 g/dL (ref 13.0–17.0)
MCH: 21.9 pg — ABNORMAL LOW (ref 26.0–34.0)
MCHC: 32 g/dL (ref 30.0–36.0)
MCV: 68.4 fL — AB (ref 78.0–100.0)
Platelets: 218 10*3/uL (ref 150–400)
RBC: 6.71 MIL/uL — ABNORMAL HIGH (ref 4.22–5.81)
RDW: 15.7 % — AB (ref 11.5–15.5)
WBC: 11.7 10*3/uL — AB (ref 4.0–10.5)

## 2015-03-16 LAB — GLUCOSE, CAPILLARY
GLUCOSE-CAPILLARY: 322 mg/dL — AB (ref 70–99)
Glucose-Capillary: 303 mg/dL — ABNORMAL HIGH (ref 70–99)
Glucose-Capillary: 354 mg/dL — ABNORMAL HIGH (ref 70–99)
Glucose-Capillary: 378 mg/dL — ABNORMAL HIGH (ref 70–99)

## 2015-03-16 LAB — D-DIMER, QUANTITATIVE (NOT AT ARMC)

## 2015-03-16 MED ORDER — FLUTICASONE PROPIONATE 50 MCG/ACT NA SUSP
1.0000 | Freq: Every day | NASAL | Status: DC
  Administered 2015-03-16 – 2015-03-17 (×2): 1 via NASAL
  Filled 2015-03-16: qty 16

## 2015-03-16 MED ORDER — INSULIN GLARGINE 100 UNIT/ML ~~LOC~~ SOLN
40.0000 [IU] | Freq: Two times a day (BID) | SUBCUTANEOUS | Status: DC
  Administered 2015-03-16 – 2015-03-17 (×2): 40 [IU] via SUBCUTANEOUS
  Filled 2015-03-16 (×3): qty 0.4

## 2015-03-16 NOTE — Progress Notes (Signed)
Physical Therapy Treatment Patient Details Name: Jared Hall MRN: [id number] DOB: 1931/05/02 Today's Date: 03/16/2015    History of Present Illness Jared Hall is an 81 y.o. male who reports that he awakened yesterday 03/13/2015 (time unknown) at his baseline. He usually sleeps in his recliner and took a nap yesterday afternoon. When he awakened he noted that he was unable to eat because things were falling out of the left side of his mouth and he was choking on his food if he attempted to swallow. He waited until today 03/14/2015 with no improvement in his symptoms. He reports that he is unable to close his left eye. He is numb on the left side of his face. Hies left eye is tearing and he feels a fullness in his left ear. He reports that he has had a stroke in the past, about 14 years ago, with no residual.    PT Comments    Pt with improved ambulation tolerance and ability to follow commands this date however not well enough to return home alone. Pt to benefit from ST-SNF to allow for progression to safe mod I level of function for safe transition home alone.   Follow Up Recommendations  SNF     Equipment Recommendations       Recommendations for Other Services       Precautions / Restrictions Precautions Precautions: Fall Precaution Comments: BIL LE leg wraps from blisters that have been treated with home nurse for x6 weeks Restrictions Weight Bearing Restrictions: No    Mobility  Bed Mobility               General bed mobility comments: in chair upon arrival  Transfers Overall transfer level: Needs assistance Equipment used: Rolling walker (2 wheeled) Transfers: Sit to/from Stand Sit to Stand: Min guard         General transfer comment: pt with good use of hands on arm rests  Ambulation/Gait Ambulation/Gait assistance: Min assist Ambulation Distance (Feet): 150 Feet Assistive device: Rolling walker (2 wheeled) Gait Pattern/deviations: Step-through  pattern;Decreased stride length Gait velocity: slow   General Gait Details: increased trunk flexion, v/c's to extend trunk and decreased wbing on UEs   Stairs            Wheelchair Mobility    Modified Rankin (Stroke Patients Only) Modified Rankin (Stroke Patients Only) Pre-Morbid Rankin Score: Slight disability Modified Rankin: Moderately severe disability     Balance Overall balance assessment: Needs assistance         Standing balance support: Bilateral upper extremity supported Standing balance-Leahy Scale: Poor                      Cognition Arousal/Alertness: Awake/alert Behavior During Therapy: WFL for tasks assessed/performed Overall Cognitive Status: Within Functional Limits for tasks assessed                      Exercises General Exercises - Lower Extremity Long Arc Quad: AROM;Both;10 reps;Seated (with 5 second hold) Hip Flexion/Marching: AROM;Both;10 reps;Standing Heel Raises: AROM;Both;10 reps;Standing    General Comments General comments (skin integrity, edema, etc.): bil LE wounds with coband      Pertinent Vitals/Pain Pain Assessment: No/denies pain    Home Living Family/patient expects to be discharged to:: Skilled nursing facility                    Prior Function Level of Independence: Independent with assistive device(s)  Comments: pt reports daughter-in-law to do grocery shopping and driving.   PT Goals (current goals can now be found in the care plan section) Acute Rehab PT Goals Patient Stated Goal: to get better to go hoem. Requesting [facility] ( the one across from [facility]) Progress towards PT goals: Progressing toward goals    Frequency  Min 4X/week    PT Plan Discharge plan needs to be updated    Co-evaluation             End of Session Equipment Utilized During Treatment: Gait belt Activity Tolerance: Patient tolerated treatment well Patient left: in chair;with call bell/phone  within reach;with chair alarm set     Time: 1415-1435 PT Time Calculation (min) (ACUTE ONLY): 20 min  Charges:  $Gait Training: 8-22 mins                    G Codes:      Kingsley Callander 03/16/2015, 3:10 PM   Kittie Plater, PT, DPT Pager #: [phone] Office #: [phone]

## 2015-03-16 NOTE — Progress Notes (Signed)
CARE MANAGEMENT NOTE 03/16/2015  Patient:  Jared Hall, Jared Hall   Account Number:  [id number]  Date Initiated:  03/16/2015  Documentation initiated by:  Olga Coaster  Subjective/Objective Assessment:   ADMITTED WITH STROKE     Action/Plan:   CM FOLLOWING FOR DCP   Anticipated DC Date:  03/18/2015   Anticipated DC Plan:  SKILLED NURSING FACILITY VS CIR  In-house referral  Clinical Social Worker      DC Planning Services  CM consult        Status of service:  In process, will continue to follow  Per UR Regulation:  Reviewed for med. necessity/level of care/duration of stay  Comments:  4/26/2016Mindi Slicker RN,BSN,MHA [phone]

## 2015-03-16 NOTE — Consult Note (Signed)
Physical Medicine and Rehabilitation Consult Reason for Consult: Left frontal infarct likely embolic secondary to atrial fibrillation Referring Physician: [facility]   HPI: Jared Hall is a 81 y.o. right handed male with history of hypertension, hyperlipidemia, diabetes mellitus and peripheral neuropathy and CVA 2005 without residual maintain on aspirin 81 mg daily and Plavix. Patient independent prior to admission using a rolling walker and living alone. Presented 03/14/2015 with dysarthria as well as dysphagia as well as numbness on the left. MRI showed a small 6 mm acute ischemic infarct involving the cortical gray matter of the high left frontal lobe. MRA of the head negative. Carotid Dopplers with no ICA stenosis. Echocardiogram pending. Patient did not receive TPA. Neurology consulted continues to be maintained on aspirin and Plavix therapy. Subcutaneous heparin for DVT prophylaxis. Currently on a dysphagia 1 nectar thick liquid diet. Follow-up wound care nurse for bilateral Una boots which patient was followed prior to admission by home health services for ischemic changes lower extremities. Physical therapy evaluation completed 03/15/2015 with recommendations of physical medicine and rehabilitation consult.   Review of Systems  Cardiovascular: Positive for leg swelling.  Genitourinary: Positive for urgency.  Musculoskeletal: Positive for myalgias.  Neurological: Positive for speech change and weakness.       Dysphagia  All other systems reviewed and are negative.  Past Medical History  Diagnosis Date  . Umbilical hernia   . Hypertension   . High cholesterol   . Type II diabetes mellitus   . Stroke ~ 2005    denies residual on 2/123/2015  . Prostate cancer     S/P "8 weeks of radiation"  . CHF (congestive heart failure)   . Onychomycosis   . BPH (benign prostatic hyperplasia)   . Urinary urgency     with incontinence  . Lower extremity edema   . Prostatitis     recurrent    . Sleep apnea   . Alpha thalassemia    Past Surgical History  Procedure Laterality Date  . Ventral hernia repair  12/18/2011    Procedure: HERNIA REPAIR VENTRAL ADULT;  Surgeon: Adin Hector, MD;  Location: [facility];  Service: General;  Laterality: N/A;  . Hernia repair    . Vasectomy     Family History  Problem Relation Age of Onset  . Pneumonia Mother   . Heart attack Father    Social History:  reports that he has never smoked. He has never used smokeless tobacco. He reports that he does not drink alcohol or use illicit drugs. Allergies:  Allergies  Allergen Reactions  . Flomax [Tamsulosin Hcl] Other (See Comments)    Excessive tearing  . Glucophage [Metformin Hcl] Diarrhea   Medications Prior to Admission  Medication Sig Dispense Refill  . aspirin EC 81 MG tablet Take 81 mg by mouth daily.    . B Complex Vitamins (VITAMIN B COMPLEX PO) Take 1 tablet by mouth daily.    . clopidogrel (PLAVIX) 75 MG tablet Take 75 mg by mouth daily.    Mariane Baumgarten Sodium (DSS) 100 MG CAPS Take 100 mg by mouth 2 (two) times daily.    . furosemide (LASIX) 80 MG tablet Take 40 mg by mouth daily.    [name] Kitchen HYDROcodone-acetaminophen (NORCO/VICODIN) 5-325 MG per tablet Take 2 tablets by mouth every 4 (four) hours as needed. (Patient taking differently: Take 2 tablets by mouth every 4 (four) hours as needed for moderate pain. ) 20 tablet 0  . insulin glargine (  LANTUS) 100 UNIT/ML injection Inject 80 Units into the skin 2 (two) times daily.     . potassium chloride SA (K-DUR,KLOR-CON) 20 MEQ tablet Take 20 mEq by mouth daily.    . pregabalin (LYRICA) 50 MG capsule Take 50 mg by mouth 2 (two) times daily.    . rifampin (RIFADIN) 300 MG capsule Take 1 capsule (300 mg total) by mouth 2 (two) times daily. 14 capsule 0  . terazosin (HYTRIN) 2 MG capsule Take 4 mg by mouth daily.    . vitamin B-12 (CYANOCOBALAMIN) 1000 MCG tablet Take 1,000 mcg by mouth daily.    [name] Kitchen zolpidem (AMBIEN) 5 MG tablet Take 5 mg by mouth  at bedtime as needed for sleep.      Home: Home Living Family/patient expects to be discharged to:: Private residence Living Arrangements: Alone (pt reports lives alone however RN reports he has a wife) Type of Home: House Home Access: [place]: One [place]: Environmental consultant - 4 wheels, Environmental consultant - 2 wheels  Functional History: Prior Function Level of Independence: Independent with assistive device(s) (uses RW) Comments: pt reports daughter-in-law to do grocery shopping and driving. Functional Status:  Mobility: Bed Mobility Overal bed mobility: Needs Assistance Bed Mobility: Supine to Sit Supine to sit: Min assist General bed mobility comments: definite use of bed rail. used rocking momentum to pull self up , assist for trunk elevation Transfers Overall transfer level: Needs assistance Equipment used: Rolling walker (2 wheeled) Transfers: Sit to/from Stand Sit to Stand: Min assist General transfer comment: v/c's to prevent falling forward. v/c's to push up from bed Ambulation/Gait Ambulation/Gait assistance: Min assist Ambulation Distance (Feet): 60 Feet Assistive device: Rolling walker (2 wheeled) Gait Pattern/deviations: Step-through pattern, Decreased stride length, Trunk flexed Gait velocity: slow General Gait Details: nurse tech for chair follow, v/c's to stand up right, increased bilat UE WBing on UE on RW, + SOB requiring a seated rest break    ADL:    Cognition: Cognition Overall Cognitive Status: No family/caregiver present to determine baseline cognitive functioning Arousal/Alertness: Awake/alert Orientation Level: Oriented X4 Attention: Focused, Sustained, Alternating Focused Attention: Appears intact Sustained Attention: Appears intact Alternating Attention: Appears intact Memory: Impaired Memory Impairment: Storage deficit, Decreased short term memory, Decreased recall of new information Decreased Short Term Memory: Verbal basic,  Functional basic Awareness: Appears intact Problem Solving: Impaired Problem Solving Impairment: Functional complex Cognition Arousal/Alertness: Awake/alert Behavior During Therapy: WFL for tasks assessed/performed Overall Cognitive Status: No family/caregiver present to determine baseline cognitive functioning Area of Impairment: Safety/judgement, Following commands, Problem solving Following Commands: Follows one step commands with increased time Safety/Judgement: Decreased awareness of safety, Decreased awareness of deficits Problem Solving: Difficulty sequencing, Slow processing General Comments: v/c's for walker safety, impulsive when getting up from bed tow alker  Blood pressure 150/88, pulse 92, temperature 97.9 F (36.6 C), temperature source Oral, resp. rate 18, SpO2 94 %. Physical Exam  Vitals reviewed. HENT:  edentulous  Eyes: EOM are normal.  Neck: Normal range of motion. Neck supple. No thyromegaly present.  Cardiovascular: Normal rate and regular rhythm.   Respiratory: Effort normal and breath sounds normal. No respiratory distress.  GI: Soft. Bowel sounds are normal. He exhibits no distension.  Neurological: He is alert. A cranial nerve deficit is present. Coordination abnormal.  Makes good eye contact with examiner. He is hard of hearing. Speech is dysarthric but intelligible. He does follow simple commands. left facial droop and difficulty with lid closure. mild Right pronator drift. Strength 4  to 4+/5 RUE and RLE and 4+ LUE and LLE. Seems to sense pain in all 4's. ?decreased LT LUE?   Skin:  Unna Boots in place to lower extremities  Psychiatric:  Flat affect, cooperative.     Results for orders placed or performed during the hospital encounter of 03/14/15 (from the past 24 hour(s))  Lipid panel     Status: Abnormal   Collection Time: 03/15/15  7:05 AM  Result Value Ref Range   Cholesterol 140 0 - 200 mg/dL   Triglycerides 177 (H) <150 mg/dL   HDL 37 (L) >39  mg/dL   Total CHOL/HDL Ratio 3.8 RATIO   VLDL 35 0 - 40 mg/dL   LDL Cholesterol 68 0 - 99 mg/dL  CBC     Status: Abnormal   Collection Time: 03/15/15  7:05 AM  Result Value Ref Range   WBC 10.9 (H) 4.0 - 10.5 K/uL   RBC 6.42 (H) 4.22 - 5.81 MIL/uL   Hemoglobin 13.9 13.0 - 17.0 g/dL   HCT 43.8 39.0 - 52.0 %   MCV 68.2 (L) 78.0 - 100.0 fL   MCH 21.7 (L) 26.0 - 34.0 pg   MCHC 31.7 30.0 - 36.0 g/dL   RDW 15.4 11.5 - 15.5 %   Platelets 192 150 - 400 K/uL  Comprehensive metabolic panel     Status: Abnormal   Collection Time: 03/15/15  7:05 AM  Result Value Ref Range   Sodium 138 135 - 145 mmol/L   Potassium 3.9 3.5 - 5.1 mmol/L   Chloride 99 96 - 112 mmol/L   CO2 29 19 - 32 mmol/L   Glucose, Bld 118 (H) 70 - 99 mg/dL   BUN 14 6 - 23 mg/dL   Creatinine, Ser 1.11 0.50 - 1.35 mg/dL   Calcium 9.3 8.4 - 10.5 mg/dL   Total Protein 7.0 6.0 - 8.3 g/dL   Albumin 3.6 3.5 - 5.2 g/dL   AST 23 0 - 37 U/L   ALT 20 0 - 53 U/L   Alkaline Phosphatase 68 39 - 117 U/L   Total Bilirubin 0.9 0.3 - 1.2 mg/dL   GFR calc non Af Amer 60 (L) >90 mL/min   GFR calc Af Amer 69 (L) >90 mL/min   Anion gap 10 5 - 15  TSH     Status: None   Collection Time: 03/15/15  7:05 AM  Result Value Ref Range   TSH 1.253 0.350 - 4.500 uIU/mL  Vitamin B12     Status: None   Collection Time: 03/15/15  7:05 AM  Result Value Ref Range   Vitamin B-12 448 211 - 911 pg/mL  Sedimentation rate     Status: None   Collection Time: 03/15/15  7:05 AM  Result Value Ref Range   Sed Rate 2 0 - 16 mm/hr  Glucose, capillary     Status: Abnormal   Collection Time: 03/15/15 12:08 PM  Result Value Ref Range   Glucose-Capillary 156 (H) 70 - 99 mg/dL   Comment 1 Notify RN    Comment 2 Document in Chart   Glucose, capillary     Status: Abnormal   Collection Time: 03/15/15  4:42 PM  Result Value Ref Range   Glucose-Capillary 222 (H) 70 - 99 mg/dL   Comment 1 Notify RN    Comment 2 Document in Chart   Glucose, capillary     Status:  Abnormal   Collection Time: 03/15/15  9:37 PM  Result Value Ref Range  Glucose-Capillary 283 (H) 70 - 99 mg/dL   Comment 1 Notify RN    Comment 2 Document in Chart    Dg Chest 2 View  03/15/2015   CLINICAL DATA:  Difficulty chewing and swallowing for 2 days. Possible stroke.  EXAM: CHEST  2 VIEW  COMPARISON:  01/11/2014.  FINDINGS: Telemetry leads overlie the chest.  Cardiomegaly. Mild vascular congestion. No active infiltrates or failure. Scarring LEFT base. No thoracic compression deformity. Skeletal osteopenia. Similar appearance to priors, except that the area previously noted of LEFT midlung zone scarring is improved.  IMPRESSION: Stable exam.  Cardiomegaly with mild vascular congestion.   Electronically Signed   By: Rolla Flatten M.D.   On: 03/15/2015 14:51   Ct Head (brain) Wo Contrast  03/14/2015   CLINICAL DATA:  Stroke symptoms.  Facial tingling and slurred speech  EXAM: CT HEAD WITHOUT CONTRAST  TECHNIQUE: Contiguous axial images were obtained from the base of the skull through the vertex without intravenous contrast.  COMPARISON:  CT head 12/06/2014  FINDINGS: Generalized atrophy. Negative for hydrocephalus. Moderate chronic microvascular ischemic changes in the white matter. Chronic infarct right inferior cerebellum.  Negative for acute infarct. Negative for hemorrhage or mass. Subdural hygroma around the right cerebellar hemisphere unchanged from prior studies  Calvarium intact.  IMPRESSION: Atrophy and chronic ischemia.  No acute abnormality.   Electronically Signed   By: Franchot Gallo M.D.   On: 03/14/2015 20:21   Mr Brain Wo Contrast  03/15/2015   CLINICAL DATA:  Initial evaluation for numbness and tingling tooth face with slurred speech.  EXAM: MRI HEAD WITHOUT CONTRAST  TECHNIQUE: Multiplanar, multiecho pulse sequences of the brain and surrounding structures were obtained without intravenous contrast.  COMPARISON:  Prior CT from earlier the same day.  FINDINGS: Diffuse prominence of  the CSF containing spaces is compatible with generalized cerebral atrophy. Patchy and confluent T2/FLAIR hyperintensity within the periventricular and deep white matter both cerebral hemispheres most consistent with chronic small vessel ischemic disease. There are remote lacunar infarcts involving the periventricular white matter bilaterally. Remote right cerebellar infarct present. Smaller are remote left cerebellar infarcts present.  There is a small focus of restricted diffusion measuring approximately 5 mm 6 mm involving the cortical gray matter of the high left frontal lobe in the region of the motor strip (series 3, image 42). Finding most consistent with a small acute ischemic infarct. This appears more linear in nature on corresponding coronal DWI sequence (series 5, image 14). No associated hemorrhage or mass effect. No other acute ischemic infarct. Normal intravascular flow voids are maintained. No mass lesion or midline shift. No mass effect. No hydrocephalus. No extra-axial fluid collection.  Craniocervical junction within normal limits. Pituitary gland normal. No acute abnormality seen about the orbits. Paranasal sinuses are clear. Minimal fluid signal intensity noted within the left mastoid air cells. Inner ear structures normal.  Bone marrow signal intensity within normal limits. No scalp soft tissue abnormality.  IMPRESSION: 1. Small 6 mm acute ischemic infarct involving the cortical gray matter of the high left frontal lobe as above. No associated hemorrhage or mass effect. 2. No other acute intracranial process identified. 3. Advanced cerebral atrophy with chronic microvascular ischemic disease and multiple remote infarcts involving the periventricular white matter bilateral cerebellar hemispheres.   Electronically Signed   By: Jeannine Boga M.D.   On: 03/15/2015 00:07   Mr Jodene Nam Head/brain Wo Cm  03/15/2015   CLINICAL DATA:  81 year old male with facial weakness, trouble eating  and  swallowing. Recently detected small left frontal lobe infarct on 03/14/2015.  EXAM: MRA HEAD WITHOUT CONTRAST  TECHNIQUE: Angiographic images of the Circle of Willis were obtained using MRA technique without intravenous contrast.  COMPARISON:  Brain MRI 03/14/2015.  Head CT 03/14/2015 and earlier  FINDINGS: Antegrade flow in the posterior circulation with dominant distal left vertebral artery. Small distal right vertebral artery appears to remain patent, including the right PICA origin.  Patent basilar artery without stenosis. Bilateral fetal type PCA origins. Shared left P1 and SCA origin. These are patent. Bilateral PCA branches are within normal limits.  Tortuous distal cervical right ICA. Antegrade flow in both ICA siphons with no siphon stenosis. Normal ophthalmic and posterior communicating artery origins. Patent carotid termini. Normal MCA and ACA origins. Dominant left A1 segment. Anterior communicating artery and visualized bilateral ACA branches are within normal limits. Right MCA branches are within normal limits.  Patent left MCA M1 segment and bifurcation. Visualized left MCA branches are within normal limits.  IMPRESSION: Negative intracranial MRA.   Electronically Signed   By: Genevie Ann M.D.   On: 03/15/2015 11:34    Assessment/Plan: Diagnosis: acute left frontal infarct,   left bell's palsy 1. Does the need for close, 24 hr/day medical supervision in concert with the patient's rehab needs make it unreasonable for this patient to be served in a less intensive setting? Yes 2. Co-Morbidities requiring supervision/potential complications: htn, dm2, PAF 3. Due to bladder management, bowel management, safety, skin/wound care, disease management, medication administration, pain management and patient education, does the patient require 24 hr/day rehab nursing? Yes 4. Does the patient require coordinated care of a physician, rehab nurse, PT (1-2 hrs/day, 5 days/week), OT (1-2 hrs/day, 5 days/week) and  SLP (1-2 hrs/day, 5 days/week) to address physical and functional deficits in the context of the above medical diagnosis(es)? Yes Addressing deficits in the following areas: balance, endurance, locomotion, strength, transferring, bowel/bladder control, bathing, dressing, feeding, grooming, toileting, speech, swallowing and psychosocial support 5. Can the patient actively participate in an intensive therapy program of at least 3 hrs of therapy per day at least 5 days per week? Yes 6. The potential for patient to make measurable gains while on inpatient rehab is good 7. Anticipated functional outcomes upon discharge from inpatient rehab are modified independent and supervision  with PT, modified independent and supervision with OT, modified independent and supervision with SLP. 8. Estimated rehab length of stay to reach the above functional goals is: 7-10 days 9. Does the patient have adequate social supports and living environment to accommodate these discharge functional goals? Yes and Potentially 10. Anticipated D/C setting: Home 11. Anticipated post D/C treatments: HH therapy and Outpatient therapy 12. Overall Rehab/Functional Prognosis: excellent  RECOMMENDATIONS: This patient's condition is appropriate for continued rehabilitative care in the following setting: CIR Patient has agreed to participate in recommended program. Yes Note that insurance prior authorization may be required for reimbursement for recommended care.  Comment: Rehab Admissions Coordinator to follow up. ?social supports Thanks,  Meredith Staggers, MD, Mellody Drown     03/16/2015

## 2015-03-16 NOTE — Progress Notes (Signed)
MBSS complete. Full report located under chart review in imaging section. Mallorey Odonell, MA CCC-SLP [phone]  

## 2015-03-16 NOTE — Progress Notes (Signed)
PATIENT DETAILS Name: Jared Hall Age: 81 y.o. Sex: male Date of Birth: 03/03/1931 Admit Date: 03/14/2015 Admitting Physician Jani Gravel, MD GQQ:PYPPJ,KDTOIZ NEVILL, MD  Subjective: Much better. Had brief run of SVT yesterday  Assessment/Plan: Active Problems:   Left sided Bell's palsy: Admitted with left facial droop, difficulty speaking and swallowing. CT head negative for acute abnormalities, MRI of the brain (personally reviewed images) shows a incidental left frontal infarct-that does not explain patient's symptoms. Seen by neurology-, started on valtrex and prednisone for 7 days from 4/25.     Incidental finding of left frontal infarct MRI brain/prior history of CVA: Continue aspirin/Plavix. LDL at 68- at goal. A1c 8.5. Does have a history of paroxysmal atrial fibrillation and at one point appears to have been on anticoagulation with warfarin (personally reviewed records from prior hospitalization)-this M.D. spoke with patient's PCP-Dr. Mertha Finders over the phone, who suggested that we continue aspirin and Plavix and not start anticoagulation as this patient is a high fall risk (reason why anticoagulation discontinued in the first place). Carotid and Echo still pending.     SVT: occurred on 4/25-given IV Lopressor on 4/25-and subsequently on oral lopressor    Chronic lower extremity edema with wounds: Chronic issue, have consulted wound care. On Unna boots.    Essential hypertension, benign: Continue with Hytrin and Lasix. Follow and titrate accordingly    DM type 2 with diabetic peripheral neuropathy: CBGs currently stable-apparently takes 80 units of Lantus twice a day at home-diet now more stable-increase to 40 units BID-follow CBG's    Chronic diastolic heart failure: Clinically compensated, continue Lasix.EF per 2009 echo 60%.  Follow.     Chronic lower ext edema:Unna boots in place.Wound care RN following  Disposition: Remain inpatient-SNF once work up  complete  Antimicrobial agents  See below  Anti-infectives    Start     Dose/Rate Route Frequency Ordered Stop   03/15/15 1600  valACYclovir (VALTREX) tablet 1,000 mg     1,000 mg Oral 3 times daily 03/15/15 1300 03/22/15 1559   03/15/15 0100  rifampin (RIFADIN) capsule 300 mg  Status:  Discontinued     300 mg Oral 2 times daily 03/14/15 2349 03/15/15 1456      DVT Prophylaxis: Prophylactic Heparin   Code Status: Full code   Family Communication None at bedside  Procedures: None  CONSULTS:  neurology   MEDICATIONS: Scheduled Meds: .  stroke: mapping our early stages of recovery book   Does not apply Once  . aspirin EC  81 mg Oral Daily  . clopidogrel  75 mg Oral Daily  . docusate sodium  100 mg Oral BID  . furosemide  40 mg Oral Daily  . heparin subcutaneous  5,000 Units Subcutaneous 3 times per day  . insulin aspart  0-9 Units Subcutaneous TID WC  . insulin glargine  20 Units Subcutaneous Daily  . insulin glargine  40 Units Subcutaneous BID  . metoprolol tartrate  25 mg Oral BID  . potassium chloride SA  20 mEq Oral Daily  . prednisoLONE  60 mg Oral QAC breakfast  . pregabalin  50 mg Oral BID  . sodium chloride  3 mL Intravenous Q12H  . terazosin  4 mg Oral Daily  . valACYclovir  1,000 mg Oral TID  . vitamin B-12  1,000 mcg Oral Daily   Continuous Infusions:  PRN Meds:.sodium chloride, HYDROcodone-acetaminophen, RESOURCE THICKENUP CLEAR, sodium chloride,  zolpidem    PHYSICAL EXAM: Vital signs in last 24 hours: Filed Vitals:   03/16/15 0243 03/16/15 0617 03/16/15 1018 03/16/15 1449  BP: 150/88 161/81 152/106 160/84  Pulse: 92 90 98 92  Temp: 97.9 F (36.6 C) 98.1 F (36.7 C) 97.9 F (36.6 C) 98.1 F (36.7 C)  TempSrc: Oral Oral Oral Oral  Resp: 18 20 18 20   SpO2: 94% 94% 95% 95%    Weight change:  There were no vitals filed for this visit. There is no weight on file to calculate BMI.   Gen Exam: Awake and alert. Left facial droop, mild  dysarthria. Neck: Supple, No JVD.   Chest: B/L Clear.  No rales or rhonchi CVS: S1 S2 Regular, no murmurs.  Abdomen: soft, BS +, non tender, non distended.  Extremities:.Unna boots in place Neurologic: Non Focal.   Skin: No Rash.   Wounds: N/A.   Intake/Output from previous day:  Intake/Output Summary (Last 24 hours) at 03/16/15 1639 Last data filed at 03/15/15 2137  Gross per 24 hour  Intake      0 ml  Output    200 ml  Net   -200 ml     LAB RESULTS: CBC  Recent Labs Lab 03/14/15 1935 03/15/15 0705 03/16/15 0737  WBC 11.0* 10.9* 11.7*  HGB 13.5 13.9 14.7  HCT 43.3 43.8 45.9  PLT 188 192 218  MCV 69.1* 68.2* 68.4*  MCH 21.5* 21.7* 21.9*  MCHC 31.2 31.7 32.0  RDW 15.5 15.4 15.7*  LYMPHSABS 1.9  --   --   MONOABS 0.9  --   --   EOSABS 0.2  --   --   BASOSABS 0.0  --   --     Chemistries   Recent Labs Lab 03/14/15 1935 03/15/15 0705  NA 139 138  K 4.2 3.9  CL 102 99  CO2 26 29  GLUCOSE 129* 118*  BUN 17 14  CREATININE 1.21 1.11  CALCIUM 9.1 9.3    CBG:  Recent Labs Lab 03/15/15 1208 03/15/15 1642 03/15/15 2137 03/16/15 0627 03/16/15 1136  GLUCAP 156* 222* 283* 303* 354*    GFR CrCl cannot be calculated (Unknown ideal weight.).  Coagulation profile  Recent Labs Lab 03/14/15 1935  INR 1.04    Cardiac Enzymes No results for input(s): CKMB, TROPONINI, MYOGLOBIN in the last 168 hours.  Invalid input(s): CK  Invalid input(s): POCBNP No results for input(s): DDIMER in the last 72 hours.  Recent Labs  03/15/15 0705  HGBA1C 8.5*    Recent Labs  03/15/15 0705  CHOL 140  HDL 37*  LDLCALC 68  TRIG 177*  CHOLHDL 3.8    Recent Labs  03/15/15 0705  TSH 1.253    Recent Labs  03/15/15 0705  VITAMINB12 448   No results for input(s): LIPASE, AMYLASE in the last 72 hours.  Urine Studies No results for input(s): UHGB, CRYS in the last 72 hours.  Invalid input(s): UACOL, UAPR, USPG, UPH, UTP, UGL, UKET, UBIL, UNIT, UROB,  ULEU, UEPI, UWBC, URBC, UBAC, CAST, UCOM, BILUA  MICROBIOLOGY: No results found for this or any previous visit (from the past 240 hour(s)).  RADIOLOGY STUDIES/RESULTS: Dg Chest 2 View  03/15/2015   CLINICAL DATA:  Difficulty chewing and swallowing for 2 days. Possible stroke.  EXAM: CHEST  2 VIEW  COMPARISON:  01/11/2014.  FINDINGS: Telemetry leads overlie the chest.  Cardiomegaly. Mild vascular congestion. No active infiltrates or failure. Scarring LEFT base. No thoracic compression deformity. Skeletal  osteopenia. Similar appearance to priors, except that the area previously noted of LEFT midlung zone scarring is improved.  IMPRESSION: Stable exam.  Cardiomegaly with mild vascular congestion.   Electronically Signed   By: Rolla Flatten M.D.   On: 03/15/2015 14:51   Ct Head (brain) Wo Contrast  03/14/2015   CLINICAL DATA:  Stroke symptoms.  Facial tingling and slurred speech  EXAM: CT HEAD WITHOUT CONTRAST  TECHNIQUE: Contiguous axial images were obtained from the base of the skull through the vertex without intravenous contrast.  COMPARISON:  CT head 12/06/2014  FINDINGS: Generalized atrophy. Negative for hydrocephalus. Moderate chronic microvascular ischemic changes in the white matter. Chronic infarct right inferior cerebellum.  Negative for acute infarct. Negative for hemorrhage or mass. Subdural hygroma around the right cerebellar hemisphere unchanged from prior studies  Calvarium intact.  IMPRESSION: Atrophy and chronic ischemia.  No acute abnormality.   Electronically Signed   By: Franchot Gallo M.D.   On: 03/14/2015 20:21   Mr Brain Wo Contrast  03/15/2015   CLINICAL DATA:  Initial evaluation for numbness and tingling tooth face with slurred speech.  EXAM: MRI HEAD WITHOUT CONTRAST  TECHNIQUE: Multiplanar, multiecho pulse sequences of the brain and surrounding structures were obtained without intravenous contrast.  COMPARISON:  Prior CT from earlier the same day.  FINDINGS: Diffuse prominence of  the CSF containing spaces is compatible with generalized cerebral atrophy. Patchy and confluent T2/FLAIR hyperintensity within the periventricular and deep white matter both cerebral hemispheres most consistent with chronic small vessel ischemic disease. There are remote lacunar infarcts involving the periventricular white matter bilaterally. Remote right cerebellar infarct present. Smaller are remote left cerebellar infarcts present.  There is a small focus of restricted diffusion measuring approximately 5 mm 6 mm involving the cortical gray matter of the high left frontal lobe in the region of the motor strip (series 3, image 42). Finding most consistent with a small acute ischemic infarct. This appears more linear in nature on corresponding coronal DWI sequence (series 5, image 14). No associated hemorrhage or mass effect. No other acute ischemic infarct. Normal intravascular flow voids are maintained. No mass lesion or midline shift. No mass effect. No hydrocephalus. No extra-axial fluid collection.  Craniocervical junction within normal limits. Pituitary gland normal. No acute abnormality seen about the orbits. Paranasal sinuses are clear. Minimal fluid signal intensity noted within the left mastoid air cells. Inner ear structures normal.  Bone marrow signal intensity within normal limits. No scalp soft tissue abnormality.  IMPRESSION: 1. Small 6 mm acute ischemic infarct involving the cortical gray matter of the high left frontal lobe as above. No associated hemorrhage or mass effect. 2. No other acute intracranial process identified. 3. Advanced cerebral atrophy with chronic microvascular ischemic disease and multiple remote infarcts involving the periventricular white matter bilateral cerebellar hemispheres.   Electronically Signed   By: Jeannine Boga M.D.   On: 03/15/2015 00:07   Dg Swallowing Func-speech Pathology  03/16/2015    Objective Swallowing Evaluation:    Patient Details  Name: Jared Hall MRN: [id number] Date of Birth: 05-30-1931  Today's Date: 03/16/2015 Time: SLP Start Time (ACUTE ONLY): 0920-SLP Stop Time (ACUTE ONLY): 0940 SLP Time Calculation (min) (ACUTE ONLY): 20 min  Past Medical History:  Past Medical History  Diagnosis Date  . Umbilical hernia   . Hypertension   . High cholesterol   . Type II diabetes mellitus   . Stroke ~ 2005    denies residual on 2/123/2015  .  Prostate cancer     S/P "8 weeks of radiation"  . CHF (congestive heart failure)   . Onychomycosis   . BPH (benign prostatic hyperplasia)   . Urinary urgency     with incontinence  . Lower extremity edema   . Prostatitis     recurrent  . Sleep apnea   . Alpha thalassemia    Past Surgical History:  Past Surgical History  Procedure Laterality Date  . Ventral hernia repair  12/18/2011    Procedure: HERNIA REPAIR VENTRAL ADULT;  Surgeon: Adin Hector, MD;   Location: [facility];  Service: General;  Laterality: N/A;  . Hernia repair    . Vasectomy     HPI:  HPI: JAEDEN MESSER is an 81 y.o. male who reports that he was unable to eat  because things were falling out of the left side of his mouth and he was  choking on his food if he attempted to swallow. He reports that he is  unable to close his left eye. He is numb on the left side of his face.  His left eye is tearing and he feels a fullness in his left ear. He  reports that he has had a stroke in the past, about 14 years ago, with no  residual. 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 MRI shows Small 6 mm acute ischemic infarct  involving the cortical gray matter of the high left frontal lobe in the  region of the motor strip. 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 Reduced labial closure and buccal  tension on the left lead to anterior spillage with cup sips and reduced  bolus cohesion with buccal residuals. Use of small straw sips and soft  moist solids improves bolus control. Mild oral residuals remain and fall  to valleculae post swallow, cleared with cued second swallow.  Oropharyngeal function also impaired due to primary sensory deficits with  delayed swallow initiation and silent aspiration of thin liquids before  the swallow. Chin tuck and head tilt to the right do not improve function.  Recommend a dys 2 (fine chop) diet with nectar thick liquids and second  swallow. SLP to f/u for diet tolerance and advancement.        CHL IP TREATMENT RECOMMENDATION 03/16/2015  Treatment Plan Recommendations Therapy as outlined in treatment plan below      CHL IP DIET RECOMMENDATION 03/16/2015  Diet Recommendations Dysphagia 1 (Puree);Nectar-thick liquid  Liquid Administration via Straw  Medication Administration Whole meds with puree  Compensations Slow rate;Small sips/bites;Check for pocketing;Check for  anterior loss;Multiple dry swallows after each bite/sip  Postural Changes and/or Swallow Maneuvers Seated upright 90 degrees     CHL IP OTHER RECOMMENDATIONS 03/16/2015  Recommended Consults (None)  Oral Care Recommendations Oral care BID  Other Recommendations Order thickener from pharmacy     CHL IP FOLLOW UP RECOMMENDATIONS 03/16/2015  Follow up Recommendations Inpatient Rehab     CHL IP FREQUENCY AND DURATION 03/16/2015  Speech Therapy Frequency (ACUTE ONLY) min 2x/week  Treatment Duration 2 weeks     Pertinent Vitals/Pain NA    SLP Swallow Goals No flowsheet data found.  No flowsheet data found.    No flowsheet data found.   CHL IP ORAL PHASE 03/16/2015  Lips (None)  Tongue (None)  Mucous membranes (None)  Nutritional status (None)  Other (None)  Oxygen therapy (None)  Oral Phase Impaired  Oral - Pudding Teaspoon (None)  Oral -  Pudding Cup (None)  Oral - Honey Teaspoon (None)  Oral - Honey Cup (None)  Oral - Honey Syringe (None)  Oral - Nectar Teaspoon (None)  Oral - Nectar Cup Left anterior bolus loss;Left pocketing in lateral sulci   Oral - Nectar Straw Left pocketing in lateral sulci  Oral - Nectar Syringe (None)  Oral - Ice Chips (None)  Oral - Thin Teaspoon (None)  Oral - Thin Cup Left anterior bolus loss;Left pocketing in lateral sulci  Oral - Thin Straw Left pocketing in lateral sulci  Oral - Thin Syringe (None)  Oral - Puree Left pocketing in lateral sulci  Oral - Mechanical Soft Left pocketing in lateral sulci  Oral - Regular (None)  Oral - Multi-consistency (None)  Oral - Pill Left anterior bolus loss;Left pocketing in lateral sulci  Oral Phase - Comment (None)      CHL IP PHARYNGEAL PHASE 03/16/2015  Pharyngeal Phase Impaired  Pharyngeal - Pudding Teaspoon (None)  Penetration/Aspiration details (pudding teaspoon) (None)  Pharyngeal - Pudding Cup (None)  Penetration/Aspiration details (pudding cup) (None)  Pharyngeal - Honey Teaspoon (None)  Penetration/Aspiration details (honey teaspoon) (None)  Pharyngeal - Honey Cup (None)  Penetration/Aspiration details (honey cup) (None)  Pharyngeal - Honey Syringe (None)  Penetration/Aspiration details (honey syringe) (None)  Pharyngeal - Nectar Teaspoon (None)  Penetration/Aspiration details (nectar teaspoon) (None)  Pharyngeal - Nectar Cup Delayed swallow initiation;Pharyngeal residue -  valleculae;Penetration/Aspiration before swallow  Penetration/Aspiration details (nectar cup) Material enters airway,  remains ABOVE vocal cords then ejected out  Pharyngeal - Nectar Straw Delayed swallow initiation;Pharyngeal residue -  valleculae  Penetration/Aspiration details (nectar straw) Material does not enter  airway  Pharyngeal - Nectar Syringe (None)  Penetration/Aspiration details (nectar syringe) (None)  Pharyngeal - Ice Chips (None)  Penetration/Aspiration details (ice chips) (None)   Pharyngeal - Thin Teaspoon (None)  Penetration/Aspiration details (thin teaspoon) (None)  Pharyngeal - Thin Cup Delayed swallow initiation;Pharyngeal residue -  valleculae;Penetration/Aspiration before swallow;Trace aspiration  Penetration/Aspiration details (thin cup) Material enters airway, passes  BELOW cords without attempt by patient to eject out (silent aspiration)  Pharyngeal - Thin Straw Delayed swallow initiation;Pharyngeal residue -  valleculae;Penetration/Aspiration before swallow  Penetration/Aspiration details (thin straw) Material enters airway, passes  BELOW cords without attempt by patient to eject out (silent aspiration)  Pharyngeal - Thin Syringe (None)  Penetration/Aspiration details (thin syringe') (None)  Pharyngeal - Puree Delayed swallow initiation  Penetration/Aspiration details (puree) (None)  Pharyngeal - Mechanical Soft Delayed swallow initiation  Penetration/Aspiration details (mechanical soft) (None)  Pharyngeal - Regular (None)  Penetration/Aspiration details (regular) (None)  Pharyngeal - Multi-consistency (None)  Penetration/Aspiration details (multi-consistency) (None)  Pharyngeal - Pill Delayed swallow initiation  Penetration/Aspiration details (pill) (None)  Pharyngeal Comment (None)     No flowsheet data found.  No flowsheet data found.        [name], [place] CCC-SLP [phone]  Lynann Beaver 03/16/2015, 10:09 AM    Mr Jodene Nam Head/brain Wo Cm  03/15/2015   CLINICAL DATA:  81 year old male with facial weakness, trouble eating and swallowing. Recently detected small left frontal lobe infarct on 03/14/2015.  EXAM: MRA HEAD WITHOUT CONTRAST  TECHNIQUE: Angiographic images of the Circle of Willis were  obtained using MRA technique without intravenous contrast.  COMPARISON:  Brain MRI 03/14/2015.  Head CT 03/14/2015 and earlier  FINDINGS: Antegrade flow in the posterior circulation with dominant distal left vertebral artery. Small distal right vertebral artery appears to  remain patent, including the right PICA origin.  Patent basilar artery without stenosis. Bilateral fetal type PCA origins. Shared left P1 and SCA origin. These are patent. Bilateral PCA branches are within normal limits.  Tortuous distal cervical right ICA. Antegrade flow in both ICA siphons with no siphon stenosis. Normal ophthalmic and posterior communicating artery origins. Patent carotid termini. Normal MCA and ACA origins. Dominant left A1 segment. Anterior communicating artery and visualized bilateral ACA branches are within normal limits. Right MCA branches are within normal limits.  Patent left MCA M1 segment and bifurcation. Visualized left MCA branches are within normal limits.  IMPRESSION: Negative intracranial MRA.   Electronically Signed   By: Genevie Ann M.D.   On: 03/15/2015 11:34    Oren Binet, MD  [facility] Pager:[phone]  If 7PM-7AM, please contact night-coverage [email or web address] Password [facility] 03/16/2015, 4:39 PM   LOS: 2 days

## 2015-03-16 NOTE — Clinical Social Work Note (Signed)
Clinical Social Work Assessment  Patient Details  Name: Jared Hall MRN: [id number] Date of Birth: 03-16-1931  Date of referral:  03/16/15               Reason for consult:  Facility Placement              Housing/Transportation Living arrangements for the past 2 months:  Apartment Source of Information:  Patient Patient Interpreter Needed:  None Criminal Activity/Legal Involvement Pertinent to Current Situation/Hospitalization:  No - Comment as needed Significant Relationships:  Adult Children Lives with:  Self Do you feel safe going back to the place where you live?  Yes Need for family participation in patient care:  No (Coment)  Care giving concerns:  None  Facilities manager / plan: CSW met the pt at the bedside.  CSW introduced self and purpose of the visit. CSW discussed clinical recommendation for SNF rehab. CSW inquired about the geographical location in which the pt would like to receive rehab from. The pt reported that he would like to go to [facility]. CSW explained the SNF rehab process to the pt. CSW and pt discussed insurance and its relation to SNF rehab. CSW answered all questions in which the pt inquired about. CSW provided pt with contact information for further questions. CSW will continue to follow this pt and assist with discharge as needed.   PT Recommendations:  [place] / Referral to community resources:  [place]  Patient/Family's Response to care:  Pt was agreeable to the place of care.   Patient/Family's Understanding of and Emotional Response to Diagnosis, Current Treatment, and Prognosis:  Pt presented with a great sense of humor. Pt reported that he has lived a long and good life. Pt acknowledged his current diagnose as natural event which happens. Pt expressed being grateful for being alive.   Emotional Assessment Appearance:  Appears stated age Attitude/Demeanor/Rapport:  Other (Calm ) Affect  (typically observed):  Accepting, Hopeless, Pleasant Orientation:  Oriented to Situation, Oriented to  Time, Oriented to Place, Oriented to Self Alcohol / Substance use:  Not Applicable Psych involvement (Current and /or in the community):  No (Comment)  Discharge Needs  Concerns to be addressed:  Denies Needs/Concerns at this time Current discharge risk:  None Barriers to Discharge:  No Barriers Identified  [place], MSW, [place]

## 2015-03-16 NOTE — Progress Notes (Signed)
Rehab admissions - Evaluated for possible admission.  I spoke with patient's son and dtr-in-law.  They both work and do not feel they can provide any supervision after a 7-10 days inpatient rehab stay.  I spoke with patient at the bedside.  Patient prefers to go to [facility] for a longer rehab stay.  He says he does not drive and has no help at home.  He went to a SNF 3-5 yrs ago.  I have informed case manager and SW of patient/family preferences.  Call me for questions.  #[phone]

## 2015-03-16 NOTE — Progress Notes (Signed)
Inpatient Diabetes Program Recommendations  AACE/ADA: New Consensus Statement on Inpatient Glycemic Control (2013)  Target Ranges:  Prepandial:   less than 140 mg/dL      Peak postprandial:   less than 180 mg/dL (1-2 hours)      Critically ill patients:  140 - 180 mg/dL     Results for RUMALDO, DIFATTA (MRN [id number]) as of 03/16/2015 11:53  Ref. Range 03/15/2015 12:08 03/15/2015 16:42 03/15/2015 21:37  Glucose-Capillary Latest Ref Range: 70-99 mg/dL 156 (H) 222 (H) 283 (H)    Results for TYCEN, DOCKTER (MRN [id number]) as of 03/16/2015 11:53  Ref. Range 03/16/2015 06:27  Glucose-Capillary Latest Ref Range: 70-99 mg/dL 303 (H)    Results for SAMEL, BRUNA (MRN [id number]) as of 03/16/2015 11:53  Ref. Range 03/15/2015 07:05  Hemoglobin A1C Latest Ref Range: 4.8-5.6 % 8.5 (H)    Admit with: CVA  History: DM, HTN, CVA, CHF  Home DM Meds: Lantus 80 units bid  Current DM Orders: Lantus 20 units daily            Novolog Sensitive SSI tid     **Patient currently receiving Prednisolone 60 mg daily.  **A1c 8.5%- Needs better control at home.  **Note patient will either go to Inpatient Rehab or SNF for Rehab after d/c.    MD- Fasting glucose quite elevated this AM.  Please consider increasing Lantus to 38 units daily (0.3 units/kg dosing)  Please also consider increasing Novolog SSI to Moderate scale tid ac + HS (currently ordered as Sensitive scale)      Will follow Wyn Quaker RN, MSN, CDE Diabetes Coordinator Inpatient Diabetes Program Team Pager: [phone] (8a-5p)

## 2015-03-16 NOTE — Evaluation (Signed)
Occupational Therapy Evaluation Patient Details Name: AJA BOLANDER MRN: [id number] DOB: [birth year] Today's Date: 03/16/2015    History of Present Illness OLEY LAHAIE is an 81 y.o. 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 Pt currently with functional limitiations due to the deficits listed below (see OT problem list). Pt was from home living alone. Pt will benefit from skilled OT to increase their independence and safety with adls and balance to allow discharge SNF. Pt with balance deficits and decr ability to perform LB adls     Follow Up Recommendations  SNF    Equipment Recommendations  Other (comment) (defer sn)    Recommendations for Other Services       Precautions / Restrictions Precautions Precautions: Fall Precaution Comments: BIL LE leg wraps from blisters that have been treated with home nurse for x6 weeks      Mobility Bed Mobility               General bed mobility comments: in chair on arrival   Transfers Overall transfer level: Needs assistance Equipment used: Rolling walker (2 wheeled) Transfers: Sit to/from Stand Sit to Stand: Min guard         General transfer comment: good placement of hands on chair     Balance Overall balance assessment: Needs assistance         Standing balance support: Bilateral upper extremity supported;During functional activity Standing balance-Leahy Scale: Poor                              ADL Overall ADL's : Needs assistance/impaired Eating/Feeding: Set up Eating/Feeding Details (indicate cue type and reason): swallow precautions                  Lower Body Dressing: Moderate assistance;Sit to/from stand Lower Body Dressing Details (indicate cue type and  reason): don new brief - required bil LE thread but pt able to lift both feet to (A) Toilet Transfer: Magazine features editor Details (indicate cue type and reason): use of bil UE on arm rest           General ADL Comments: pt in chair finishing lunch on arrival. pt reports decr hearing in L ear and speech changes as biggest change     Vision Vision Assessment?: No apparent visual deficits Additional Comments: pt able to track in all quadrants but does have decr convergence   Perception     Praxis      Pertinent Vitals/Pain Pain Assessment: No/denies pain     Hand Dominance Right   Extremity/Trunk Assessment Upper Extremity Assessment Upper Extremity Assessment: Overall WFL for tasks assessed   Lower Extremity Assessment Lower Extremity Assessment: Defer to PT evaluation   Cervical / Trunk Assessment Cervical / Trunk Assessment: Kyphotic   Communication Communication Communication: Expressive difficulties;HOH   Cognition                           General Comments       Exercises       Shoulder Instructions      Home Living Family/patient expects to be discharged to:: Skilled nursing facility  Prior Functioning/Environment Level of Independence: Independent with assistive device(s)        Comments: pt reports daughter-in-law to do grocery shopping and driving.    OT Diagnosis: Generalized weakness   OT Problem List: Decreased strength;Decreased activity tolerance;Impaired balance (sitting and/or standing);Decreased safety awareness;Decreased knowledge of use of DME or AE;Decreased knowledge of precautions;Obesity   OT Treatment/Interventions: Self-care/ADL training;Therapeutic exercise;DME and/or AE instruction;Therapeutic activities;Patient/family education;Balance training    OT Goals(Current goals can be found in the care plan section) Acute Rehab OT Goals Patient Stated Goal: to  get better to go hoem. Requesting [facility] ( the one across from [place]) OT Goal Formulation: With patient Time For Goal Achievement: 03/30/15 Potential to Achieve Goals: Good  OT Frequency: Min 2X/week   Barriers to D/C: Decreased caregiver support  lives alone       Co-evaluation              End of Session Equipment Utilized During Treatment: Surveyor, mining Communication: Mobility status;Precautions  Activity Tolerance: Patient tolerated treatment well Patient left: in chair;with call bell/phone within reach;with chair alarm set   Time: 1246-1300 OT Time Calculation (min): 14 min Charges:  OT General Charges $OT Visit: 1 Procedure OT Evaluation $Initial OT Evaluation Tier I: 1 Procedure G-Codes:    Peri Maris 03/19/15, 2:21 PM  Pager: [phone]

## 2015-03-16 NOTE — Progress Notes (Signed)
Paged MD regarding pt SHOB.Pt sats 94% on room air.  Pt assisted into tri pod position and supplemental O2 applied. New orders received.

## 2015-03-17 DIAGNOSIS — G4733 Obstructive sleep apnea (adult) (pediatric): Secondary | ICD-10-CM | POA: Diagnosis not present

## 2015-03-17 DIAGNOSIS — I6789 Other cerebrovascular disease: Secondary | ICD-10-CM | POA: Diagnosis not present

## 2015-03-17 DIAGNOSIS — R278 Other lack of coordination: Secondary | ICD-10-CM | POA: Diagnosis not present

## 2015-03-17 DIAGNOSIS — K5900 Constipation, unspecified: Secondary | ICD-10-CM | POA: Diagnosis not present

## 2015-03-17 DIAGNOSIS — K5901 Slow transit constipation: Secondary | ICD-10-CM | POA: Diagnosis not present

## 2015-03-17 DIAGNOSIS — G4700 Insomnia, unspecified: Secondary | ICD-10-CM | POA: Diagnosis not present

## 2015-03-17 DIAGNOSIS — I639 Cerebral infarction, unspecified: Secondary | ICD-10-CM | POA: Diagnosis not present

## 2015-03-17 DIAGNOSIS — I480 Paroxysmal atrial fibrillation: Secondary | ICD-10-CM | POA: Diagnosis not present

## 2015-03-17 DIAGNOSIS — N401 Enlarged prostate with lower urinary tract symptoms: Secondary | ICD-10-CM | POA: Diagnosis not present

## 2015-03-17 DIAGNOSIS — I10 Essential (primary) hypertension: Secondary | ICD-10-CM | POA: Diagnosis not present

## 2015-03-17 DIAGNOSIS — Z5189 Encounter for other specified aftercare: Secondary | ICD-10-CM | POA: Diagnosis not present

## 2015-03-17 DIAGNOSIS — I878 Other specified disorders of veins: Secondary | ICD-10-CM | POA: Diagnosis not present

## 2015-03-17 DIAGNOSIS — R1312 Dysphagia, oropharyngeal phase: Secondary | ICD-10-CM | POA: Diagnosis not present

## 2015-03-17 DIAGNOSIS — R471 Dysarthria and anarthria: Secondary | ICD-10-CM | POA: Diagnosis not present

## 2015-03-17 DIAGNOSIS — D72829 Elevated white blood cell count, unspecified: Secondary | ICD-10-CM | POA: Diagnosis not present

## 2015-03-17 DIAGNOSIS — G629 Polyneuropathy, unspecified: Secondary | ICD-10-CM | POA: Diagnosis not present

## 2015-03-17 DIAGNOSIS — R196 Halitosis: Secondary | ICD-10-CM | POA: Diagnosis not present

## 2015-03-17 DIAGNOSIS — G510 Bell's palsy: Secondary | ICD-10-CM | POA: Diagnosis not present

## 2015-03-17 DIAGNOSIS — E876 Hypokalemia: Secondary | ICD-10-CM | POA: Diagnosis not present

## 2015-03-17 DIAGNOSIS — I509 Heart failure, unspecified: Secondary | ICD-10-CM | POA: Diagnosis not present

## 2015-03-17 DIAGNOSIS — N179 Acute kidney failure, unspecified: Secondary | ICD-10-CM | POA: Diagnosis not present

## 2015-03-17 DIAGNOSIS — I6340 Cerebral infarction due to embolism of unspecified cerebral artery: Secondary | ICD-10-CM | POA: Diagnosis not present

## 2015-03-17 DIAGNOSIS — E1142 Type 2 diabetes mellitus with diabetic polyneuropathy: Secondary | ICD-10-CM | POA: Diagnosis not present

## 2015-03-17 DIAGNOSIS — R600 Localized edema: Secondary | ICD-10-CM | POA: Diagnosis not present

## 2015-03-17 DIAGNOSIS — E1140 Type 2 diabetes mellitus with diabetic neuropathy, unspecified: Secondary | ICD-10-CM | POA: Diagnosis not present

## 2015-03-17 DIAGNOSIS — I5032 Chronic diastolic (congestive) heart failure: Secondary | ICD-10-CM | POA: Diagnosis not present

## 2015-03-17 DIAGNOSIS — R1310 Dysphagia, unspecified: Secondary | ICD-10-CM | POA: Diagnosis not present

## 2015-03-17 DIAGNOSIS — R2681 Unsteadiness on feet: Secondary | ICD-10-CM | POA: Diagnosis not present

## 2015-03-17 DIAGNOSIS — L03119 Cellulitis of unspecified part of limb: Secondary | ICD-10-CM | POA: Diagnosis not present

## 2015-03-17 DIAGNOSIS — E785 Hyperlipidemia, unspecified: Secondary | ICD-10-CM | POA: Diagnosis not present

## 2015-03-17 DIAGNOSIS — N189 Chronic kidney disease, unspecified: Secondary | ICD-10-CM | POA: Diagnosis not present

## 2015-03-17 DIAGNOSIS — M6281 Muscle weakness (generalized): Secondary | ICD-10-CM | POA: Diagnosis not present

## 2015-03-17 DIAGNOSIS — R41841 Cognitive communication deficit: Secondary | ICD-10-CM | POA: Diagnosis not present

## 2015-03-17 DIAGNOSIS — R5381 Other malaise: Secondary | ICD-10-CM | POA: Diagnosis not present

## 2015-03-17 LAB — GLUCOSE, CAPILLARY
GLUCOSE-CAPILLARY: 311 mg/dL — AB (ref 70–99)
Glucose-Capillary: 211 mg/dL — ABNORMAL HIGH (ref 70–99)

## 2015-03-17 MED ORDER — PREDNISONE 10 MG (21) PO TBPK: ORAL_TABLET | ORAL | Status: DC

## 2015-03-17 MED ORDER — HYDROCODONE-ACETAMINOPHEN 5-325 MG PO TABS: 2.0000 | ORAL_TABLET | ORAL | Status: DC | PRN

## 2015-03-17 MED ORDER — INSULIN GLARGINE 100 UNIT/ML ~~LOC~~ SOLN: 40.0000 [IU] | Freq: Two times a day (BID) | SUBCUTANEOUS | Status: DC

## 2015-03-17 MED ORDER — FLUTICASONE PROPIONATE 50 MCG/ACT NA SUSP: 1.0000 | Freq: Every day | NASAL | Status: DC

## 2015-03-17 MED ORDER — METOPROLOL TARTRATE 25 MG PO TABS: 25.0000 mg | ORAL_TABLET | Freq: Two times a day (BID) | ORAL | Status: DC

## 2015-03-17 MED ORDER — VALACYCLOVIR HCL 1 G PO TABS: 1000.0000 mg | ORAL_TABLET | Freq: Three times a day (TID) | ORAL | Status: DC

## 2015-03-17 MED ORDER — APIXABAN 5 MG PO TABS: 5.0000 mg | ORAL_TABLET | Freq: Two times a day (BID) | ORAL | Status: DC

## 2015-03-17 MED ORDER — DOCUSATE SODIUM 100 MG PO CAPS: 100.0000 mg | ORAL_CAPSULE | Freq: Two times a day (BID) | ORAL | Status: DC

## 2015-03-17 NOTE — Progress Notes (Signed)
  Echocardiogram 2D Echocardiogram has been performed.  Jared Hall 03/17/2015, 9:59 AM

## 2015-03-17 NOTE — Discharge Summary (Signed)
Physician Discharge Summary  EMAN MORIMOTO GDJ:[id number] DOB: [birth year] DOA: 03/14/2015  PCP: Henrine Screws, MD  Admit date: 03/14/2015 Discharge date: 03/17/2015  Time spent: 35 minutes  Recommendations for Outpatient Follow-up:  1.  Please treat with Valtrex 1000 mg by mouth 3 times a day 5 days then stop 2. Prednisone 60 mg by mouth daily 5 days then stop 3. Accu-Cheks q ac and q hs, patient be discharged on steroid therapy.  Discharge Diagnoses:  Active Problems:   Essential hypertension, benign   DM type 2 with diabetic peripheral neuropathy   Dysphagia   Dysarthria   Stroke   Bell's palsy   Facial droop   Paroxysmal atrial fibrillation   Embolic stroke   Discharge Condition: Stable  Diet recommendation: Dysphagia 2 diet  There were no vitals filed for this visit.  History of present illness:  81 yo male with dm2, htn, hyperlipidemia, c/o dysphagia, and dysarthria starting last nite at about 10 pm. Pt denies headache, vision change. No hx of trauma. Pt denies any difficulty with moving arms or legs. Pt presented to ED for evaluation and was seen by neurology who requested MRI brain and r/o CVA. Pt will be admitted for observation r/o Tia, CVA.   Hospital Course:  Patient is a pleasant 81 year old gentleman with a past medical history of paroxysmal atrial fibrillation, type 2 diabetes mellitus, hypertension, dyslipidemia who was admitted to the medicine service on 03/14/2015 present team with complaints of dysarthria and left facial droop. CT scan of the brain without contrast performed on 03/14/2015 showing no acute intracranial abnormality. MRI of brain performed 03/14/2015 showing a small 6 mm acute ischemic infarct involving the cortical gray matter of the high left frontal lobe.  Left sided Bell's palsy:  -Admitted with left facial droop, difficulty speaking and swallowing. CT head negative for acute abnormalities, MRI of the brain (personally reviewed  images) shows a incidental left frontal infarct-that does not explain patient's symptoms. Seen by neurology-, started on valtrex and prednisone for 7 days from 4/25.   Left frontal infarct MRI brain/prior history of CVA:  -MRI of brain performed on 03/15/2015 showing small 6 mm acute ischemic infarct involving the cortical gray matter of the high left frontal lobe. This did not correlate with neurologic findings on physical exam. -Case was discussed with Dr. Erlinda Hong of neurology/stroke team who has recommended that patient be started on anticoagulation with Eliquis. Dr Erlinda Hong felt that the benefits of anticoagulation outweighed the risks. Will discontinue aspirin and Plavix.    SVT: occurred on 4/25-given IV Lopressor on 4/25-and subsequently on oral lopressor  Chronic lower extremity edema with wounds: Chronic issue, have consulted wound care. On Unna boots.  Essential hypertension, benign: Continue with Hytrin and Lasix. Follow and titrate accordingly   DM type 2 with diabetic peripheral neuropathy:  -Patient to be discharged on Lantus 40 units subcutaneous twice a day, please follow-up on Accu-Cheks 3 times a day, patient being discharged on steroid therapy.  Chronic diastolic heart failure:  -Transthoracic echocardiogram performed 03/17/2015 showing EF of 55% without wall motion abnormalities.  Chronic lower ext edema:Unna boots in place.Wound care RN following    Consultations:  Neurology/stroke team  Discharge Exam: Filed Vitals:   03/17/15 1417  BP: 110/55  Pulse: 99  Temp: 98.1 F (36.7 C)  Resp: 20    Gen Exam: Awake and alert. Left facial droop, mild dysarthria. Neck: Supple, No JVD.  Chest: B/L Clear. No rales or rhonchi CVS: S1 S2 Regular, no  murmurs.  Abdomen: soft, BS +, non tender, non distended.  Extremities:.Unna boots in place Neurologic: Non Focal.  Skin: No Rash.  Wounds: N/A.   Discharge Instructions   Discharge Instructions    Ambulatory  referral to Neurology    Complete by:  As directed   Dr. Erlinda Hong requests followup in 2 months     Call MD for:  difficulty breathing, headache or visual disturbances    Complete by:  As directed      Call MD for:  extreme fatigue    Complete by:  As directed      Call MD for:  hives    Complete by:  As directed      Call MD for:  persistant dizziness or light-headedness    Complete by:  As directed      Call MD for:  persistant nausea and vomiting    Complete by:  As directed      Call MD for:  redness, tenderness, or signs of infection (pain, swelling, redness, odor or green/yellow discharge around incision site)    Complete by:  As directed      Call MD for:  severe uncontrolled pain    Complete by:  As directed      Call MD for:  temperature >100.4    Complete by:  As directed      Diet - low sodium heart healthy    Complete by:  As directed      Increase activity slowly    Complete by:  As directed           Current Discharge Medication List    START taking these medications   Details  fluticasone (FLONASE) 50 MCG/ACT nasal spray Place 1 spray into both nostrils daily. Qty: 1 g, Refills: 2    metoprolol tartrate (LOPRESSOR) 25 MG tablet Take 1 tablet (25 mg total) by mouth 2 (two) times daily. Qty: 60 tablet, Refills: 1    predniSONE (STERAPRED UNI-PAK 21 TAB) 10 MG (21) TBPK tablet Take 60 mg PO q daily x 6 days then stop Qty: 21 tablet, Refills: 0    valACYclovir (VALTREX) 1000 MG tablet Take 1 tablet (1,000 mg total) by mouth 3 (three) times daily. Qty: 30 tablet, Refills: 0      CONTINUE these medications which have CHANGED   Details  docusate sodium (COLACE) 100 MG capsule Take 1 capsule (100 mg total) by mouth 2 (two) times daily. Qty: 10 capsule, Refills: 0    HYDROcodone-acetaminophen (NORCO/VICODIN) 5-325 MG per tablet Take 2 tablets by mouth every 4 (four) hours as needed. Qty: 20 tablet, Refills: 0    insulin glargine (LANTUS) 100 UNIT/ML injection Inject  0.4 mLs (40 Units total) into the skin 2 (two) times daily. Qty: 10 mL, Refills: 11      CONTINUE these medications which have NOT CHANGED   Details  aspirin EC 81 MG tablet Take 81 mg by mouth daily.    B Complex Vitamins (VITAMIN B COMPLEX PO) Take 1 tablet by mouth daily.    clopidogrel (PLAVIX) 75 MG tablet Take 75 mg by mouth daily.    furosemide (LASIX) 80 MG tablet Take 40 mg by mouth daily.    potassium chloride SA (K-DUR,KLOR-CON) 20 MEQ tablet Take 20 mEq by mouth daily.    pregabalin (LYRICA) 50 MG capsule Take 50 mg by mouth 2 (two) times daily.    terazosin (HYTRIN) 2 MG capsule Take 4 mg by mouth daily.  vitamin B-12 (CYANOCOBALAMIN) 1000 MCG tablet Take 1,000 mcg by mouth daily.    zolpidem (AMBIEN) 5 MG tablet Take 5 mg by mouth at bedtime as needed for sleep.      STOP taking these medications     rifampin (RIFADIN) 300 MG capsule        Allergies  Allergen Reactions  . Flomax [Tamsulosin Hcl] Other (See Comments)    Excessive tearing  . Glucophage [Metformin Hcl] Diarrhea   Follow-up Information    Follow up with Xu,Jindong, MD. Schedule an appointment as soon as possible for a visit in 2 months.   Specialty:  Neurology   Why:  stroke clinic, Office will call you with appointment date & time   Contact information:   [address] [place] [address] [phone]       Follow up with GATES,ROBERT NEVILL, MD. Schedule an appointment as soon as possible for a visit in 1 week.   Specialty:  Internal Medicine   Contact information:   [address] [place] [address] [place] [address] [phone]       Follow up with Henrine Screws, MD.   Specialty:  Internal Medicine   Contact information:   [address] [place] [address] [phone]        The results of significant diagnostics from this hospitalization (including imaging, microbiology, ancillary and laboratory) are listed below for  reference.    Significant Diagnostic Studies: Dg Chest 2 View  03/15/2015   CLINICAL DATA:  Difficulty chewing and swallowing for 2 days. Possible stroke.  EXAM: CHEST  2 VIEW  COMPARISON:  01/11/2014.  FINDINGS: Telemetry leads overlie the chest.  Cardiomegaly. Mild vascular congestion. No active infiltrates or failure. Scarring LEFT base. No thoracic compression deformity. Skeletal osteopenia. Similar appearance to priors, except that the area previously noted of LEFT midlung zone scarring is improved.  IMPRESSION: Stable exam.  Cardiomegaly with mild vascular congestion.   Electronically Signed   By: Rolla Flatten M.D.   On: 03/15/2015 14:51   Ct Head (brain) Wo Contrast  03/14/2015   CLINICAL DATA:  Stroke symptoms.  Facial tingling and slurred speech  EXAM: CT HEAD WITHOUT CONTRAST  TECHNIQUE: Contiguous axial images were obtained from the base of the skull through the vertex without intravenous contrast.  COMPARISON:  CT head 12/06/2014  FINDINGS: Generalized atrophy. Negative for hydrocephalus. Moderate chronic microvascular ischemic changes in the white matter. Chronic infarct right inferior cerebellum.  Negative for acute infarct. Negative for hemorrhage or mass. Subdural hygroma around the right cerebellar hemisphere unchanged from prior studies  Calvarium intact.  IMPRESSION: Atrophy and chronic ischemia.  No acute abnormality.   Electronically Signed   By: Franchot Gallo M.D.   On: 03/14/2015 20:21   Mr Brain Wo Contrast  03/15/2015   CLINICAL DATA:  Initial evaluation for numbness and tingling tooth face with slurred speech.  EXAM: MRI HEAD WITHOUT CONTRAST  TECHNIQUE: Multiplanar, multiecho pulse sequences of the brain and surrounding structures were obtained without intravenous contrast.  COMPARISON:  Prior CT from earlier the same day.  FINDINGS: Diffuse prominence of the CSF containing spaces is compatible with generalized cerebral atrophy. Patchy and confluent T2/FLAIR hyperintensity  within the periventricular and deep white matter both cerebral hemispheres most consistent with chronic small vessel ischemic disease. There are remote lacunar infarcts involving the periventricular white matter bilaterally. Remote right cerebellar infarct present. Smaller are remote left cerebellar infarcts present.  There is a small  focus of restricted diffusion measuring approximately 5 mm 6 mm involving the cortical gray matter of the high left frontal lobe in the region of the motor strip (series 3, image 42). Finding most consistent with a small acute ischemic infarct. This appears more linear in nature on corresponding coronal DWI sequence (series 5, image 14). No associated hemorrhage or mass effect. No other acute ischemic infarct. Normal intravascular flow voids are maintained. No mass lesion or midline shift. No mass effect. No hydrocephalus. No extra-axial fluid collection.  Craniocervical junction within normal limits. Pituitary gland normal. No acute abnormality seen about the orbits. Paranasal sinuses are clear. Minimal fluid signal intensity noted within the left mastoid air cells. Inner ear structures normal.  Bone marrow signal intensity within normal limits. No scalp soft tissue abnormality.  IMPRESSION: 1. Small 6 mm acute ischemic infarct involving the cortical gray matter of the high left frontal lobe as above. No associated hemorrhage or mass effect. 2. No other acute intracranial process identified. 3. Advanced cerebral atrophy with chronic microvascular ischemic disease and multiple remote infarcts involving the periventricular white matter bilateral cerebellar hemispheres.   Electronically Signed   By: Jeannine Boga M.D.   On: 03/15/2015 00:07   Dg Chest Port 1 View  03/16/2015   CLINICAL DATA:  Acute shortness of breath  EXAM: PORTABLE CHEST - 1 VIEW  COMPARISON:  03/15/2015  FINDINGS: Stable cardiomegaly and chronic left base scarring as before. Right lung remains clear. No  definite enlarging effusion or pneumothorax. Negative for edema. Atherosclerosis noted of the aorta. Trachea is midline.  IMPRESSION: Stable cardiomegaly and left lower lobe scarring. No significant interval change.   Electronically Signed   By: Jerilynn Mages.  Shick M.D.   On: 03/16/2015 23:12   Dg Swallowing Func-speech Pathology  03/16/2015    Objective Swallowing Evaluation:    Patient Details  Name: KARI KERTH MRN: [id number] Date of Birth: 1931-08-13  Today's Date: 03/16/2015 Time: SLP Start Time (ACUTE ONLY): 0920-SLP Stop Time (ACUTE ONLY): 0940 SLP Time Calculation (min) (ACUTE ONLY): 20 min  Past Medical History:  Past Medical History  Diagnosis Date  . Umbilical hernia   . Hypertension   . High cholesterol   . Type II diabetes mellitus   . Stroke ~ 2005    denies residual on 2/123/2015  . Prostate cancer     S/P "8 weeks of radiation"  . CHF (congestive heart failure)   . Onychomycosis   . BPH (benign prostatic hyperplasia)   . Urinary urgency     with incontinence  . Lower extremity edema   . Prostatitis     recurrent  . Sleep apnea   . Alpha thalassemia    Past Surgical History:  Past Surgical History  Procedure Laterality Date  . Ventral hernia repair  12/18/2011    Procedure: HERNIA REPAIR VENTRAL ADULT;  Surgeon: Adin Hector, MD;   Location: [facility];  Service: General;  Laterality: N/A;  . Hernia repair    . Vasectomy     HPI:  HPI: NATALIE LECLAIRE is an 81 y.o. male who reports that he was unable to eat  because things were falling out of the left side of his mouth and he was  choking on his food if he attempted to swallow. He reports that he is  unable to close his left eye. He is numb on the left side of his face.  His left eye is tearing and he feels a fullness in his  left ear. He  reports that he has had a stroke in the past, about 14 years ago, with no  residual. 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 MRI shows Small 6 mm acute ischemic infarct  involving  the cortical gray matter of the high left frontal lobe in the  region of the motor strip. 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 Reduced labial closure and buccal  tension on the left lead to anterior spillage with cup sips and reduced  bolus cohesion with buccal residuals. Use of small straw sips and soft  moist solids improves bolus control. Mild oral residuals remain and fall  to valleculae post swallow, cleared with cued second swallow.  Oropharyngeal function also impaired due to primary sensory deficits with  delayed swallow initiation and silent aspiration of thin liquids before  the swallow. Chin tuck and head tilt to the right do not improve function.  Recommend a dys 2 (fine chop) diet with nectar thick liquids and second  swallow. SLP to f/u for diet tolerance and advancement.        CHL IP TREATMENT RECOMMENDATION 03/16/2015  Treatment Plan Recommendations Therapy as outlined in treatment plan below      CHL IP DIET RECOMMENDATION 03/16/2015  Diet Recommendations Dysphagia 1 (Puree);Nectar-thick liquid  Liquid Administration via Straw  Medication Administration Whole meds with puree  Compensations Slow rate;Small sips/bites;Check for pocketing;Check for  anterior loss;Multiple dry swallows after each bite/sip  Postural Changes and/or Swallow Maneuvers Seated upright 90 degrees     CHL IP OTHER RECOMMENDATIONS 03/16/2015  Recommended Consults (None)  Oral Care Recommendations Oral care BID  Other Recommendations Order thickener from pharmacy     CHL IP FOLLOW UP RECOMMENDATIONS 03/16/2015  Follow up Recommendations Inpatient Rehab     CHL IP FREQUENCY AND DURATION 03/16/2015  Speech Therapy  Frequency (ACUTE ONLY) min 2x/week  Treatment Duration 2 weeks     Pertinent Vitals/Pain NA    SLP Swallow Goals No flowsheet data found.  No flowsheet data found.    No flowsheet data found.   CHL IP ORAL PHASE 03/16/2015  Lips (None)  Tongue (None)  Mucous membranes (None)  Nutritional status (None)  Other (None)  Oxygen therapy (None)  Oral Phase Impaired  Oral - Pudding Teaspoon (None)  Oral - Pudding Cup (None)  Oral - Honey Teaspoon (None)  Oral - Honey Cup (None)  Oral - Honey Syringe (None)  Oral - Nectar Teaspoon (None)  Oral - Nectar Cup Left anterior bolus loss;Left pocketing in lateral sulci   Oral - Nectar Straw Left pocketing in lateral sulci  Oral - Nectar Syringe (None)  Oral - Ice Chips (None)  Oral - Thin Teaspoon (None)  Oral - Thin Cup Left anterior bolus loss;Left pocketing in lateral sulci  Oral - Thin Straw Left pocketing in lateral sulci  Oral - Thin Syringe (None)  Oral - Puree Left pocketing in lateral sulci  Oral - Mechanical Soft Left pocketing in lateral sulci  Oral - Regular (None)  Oral - Multi-consistency (None)  Oral - Pill Left anterior bolus loss;Left pocketing in lateral sulci  Oral Phase - Comment (None)  CHL IP PHARYNGEAL PHASE 03/16/2015  Pharyngeal Phase Impaired  Pharyngeal - Pudding Teaspoon (None)  Penetration/Aspiration details (pudding teaspoon) (None)  Pharyngeal - Pudding Cup (None)  Penetration/Aspiration details (pudding cup) (None)  Pharyngeal - Honey Teaspoon (None)  Penetration/Aspiration details (honey teaspoon) (None)  Pharyngeal - Honey Cup (None)  Penetration/Aspiration details (honey cup) (None)  Pharyngeal - Honey Syringe (None)  Penetration/Aspiration details (honey syringe) (None)  Pharyngeal - Nectar Teaspoon (None)  Penetration/Aspiration details (nectar teaspoon) (None)  Pharyngeal - Nectar Cup Delayed swallow initiation;Pharyngeal residue -  valleculae;Penetration/Aspiration before swallow  Penetration/Aspiration details (nectar cup) Material enters  airway,  remains ABOVE vocal cords then ejected out  Pharyngeal - Nectar Straw Delayed swallow initiation;Pharyngeal residue -  valleculae  Penetration/Aspiration details (nectar straw) Material does not enter  airway  Pharyngeal - Nectar Syringe (None)  Penetration/Aspiration details (nectar syringe) (None)  Pharyngeal - Ice Chips (None)  Penetration/Aspiration details (ice chips) (None)  Pharyngeal - Thin Teaspoon (None)  Penetration/Aspiration details (thin teaspoon) (None)  Pharyngeal - Thin Cup Delayed swallow initiation;Pharyngeal residue -  valleculae;Penetration/Aspiration before swallow;Trace aspiration  Penetration/Aspiration details (thin cup) Material enters airway, passes  BELOW cords without attempt by patient to eject out (silent aspiration)  Pharyngeal - Thin Straw Delayed swallow initiation;Pharyngeal residue -  valleculae;Penetration/Aspiration before swallow  Penetration/Aspiration details (thin straw) Material enters airway, passes  BELOW cords without attempt by patient to eject out (silent aspiration)  Pharyngeal - Thin Syringe (None)  Penetration/Aspiration details (thin syringe') (None)  Pharyngeal - Puree Delayed swallow initiation  Penetration/Aspiration details (puree) (None)  Pharyngeal - Mechanical Soft Delayed swallow initiation  Penetration/Aspiration details (mechanical soft) (None)  Pharyngeal - Regular (None)  Penetration/Aspiration details (regular) (None)  Pharyngeal - Multi-consistency (None)  Penetration/Aspiration details (multi-consistency) (None)  Pharyngeal - Pill Delayed swallow initiation  Penetration/Aspiration details (pill) (None)  Pharyngeal Comment (None)     No flowsheet data found.  No flowsheet data found.        [name], [place] CCC-SLP [phone]  Lynann Beaver 03/16/2015, 10:09 AM    Mr Jodene Nam Head/brain Wo Cm  03/15/2015   CLINICAL DATA:  81 year old male with facial weakness, trouble eating and swallowing. Recently detected small left frontal lobe  infarct on 03/14/2015.  EXAM: MRA HEAD WITHOUT CONTRAST  TECHNIQUE: Angiographic images of the Circle of Willis were obtained using MRA technique without intravenous contrast.  COMPARISON:  Brain MRI 03/14/2015.  Head CT 03/14/2015 and earlier  FINDINGS: Antegrade flow in the posterior circulation with dominant distal left vertebral artery. Small distal right vertebral artery appears to remain patent, including the right PICA origin.  Patent basilar artery without stenosis. Bilateral fetal type PCA origins. Shared left P1 and SCA origin. These are patent. Bilateral PCA branches are within normal limits.  Tortuous distal cervical right ICA. Antegrade flow in both ICA siphons with no siphon stenosis. Normal ophthalmic and posterior communicating artery origins. Patent carotid termini. Normal MCA and ACA origins. Dominant left A1 segment. Anterior communicating artery and visualized bilateral ACA branches are within normal limits. Right MCA branches are within normal limits.  Patent left MCA M1 segment and bifurcation. Visualized left MCA branches are within normal limits.  IMPRESSION: Negative intracranial MRA.   Electronically Signed   By: Genevie Ann M.D.   On: 03/15/2015 11:34    Microbiology: No results found for this or any previous visit (from the past 240 hour(s)).   Labs: Basic Metabolic Panel:  Recent Labs Lab 03/14/15 1935 03/15/15 0705  NA 139 138  K 4.2 3.9  CL 102 99  CO2 26 29  GLUCOSE 129* 118*  BUN 17 14  CREATININE 1.21 1.11  CALCIUM 9.1 9.3   Liver Function Tests:  Recent Labs Lab 03/14/15 1935 03/15/15 0705  AST 23 23  ALT 21 20  ALKPHOS 66 68  BILITOT 0.6 0.9  PROT 6.6 7.0  ALBUMIN 3.6 3.6   No results for input(s): LIPASE, AMYLASE in the last 168 hours. No results for input(s): AMMONIA in the last 168 hours. CBC:  Recent Labs Lab 03/14/15 1935 03/15/15 0705 03/16/15 0737  WBC 11.0* 10.9* 11.7*  NEUTROABS 8.0*  --   --   HGB 13.5 13.9 14.7  HCT 43.3 43.8  45.9  MCV 69.1* 68.2* 68.4*  PLT 188 192 218   Cardiac Enzymes: No results for input(s): CKTOTAL, CKMB, CKMBINDEX, TROPONINI in the last 168 hours. BNP: BNP (last 3 results) No results for input(s): BNP in the last 8760 hours.  ProBNP (last 3 results) No results for input(s): PROBNP in the last 8760 hours.  CBG:  Recent Labs Lab 03/16/15 1136 03/16/15 1628 03/16/15 2136 03/17/15 0644 03/17/15 1104  GLUCAP 354* 378* 322* 211* 311*       Signed:  Lakethia Coppess  [facility] 03/17/2015, 2:25 PM

## 2015-03-17 NOTE — Progress Notes (Addendum)
Speech Language Pathology Treatment: Dysphagia;  Patient Details Name: Jared Hall MRN: [id number] DOB: 03/27/1931 Today's Date: 03/17/2015 Time: 4076-8088 SLP Time Calculation (min) (ACUTE ONLY): 9 min  Assessment / Plan / Recommendation Clinical Impression  Pt seen for dysphagia treatment, self feeding requiring min verbal cues to place straw on right side (stronger) of oral cavity. Independently wiping left labial residue. No s/s penetration/aspiration. Did not attempt upgraded texture today. He is scheduled for discharge to [facility] today with recommendations of continued nectar liquids, Dys 1 (puree) with continued ST at SNF for safety and ability to upgrade solids. SLP educated speech strategies with demonstration and clinical rationale for increased speech intelligibility. Pt able to return demonstrate.   HPI HPI: Jared Hall is an 81 y.o. male who reports that he was unable to eat because things were falling out of the left side of his mouth and he was choking on his food if he attempted to swallow. He reports that he is unable to close his left eye. He is numb on the left side of his face. His left eye is tearing and he feels a fullness in his left ear. He reports that he has had a stroke in the past, about 14 years ago, with no residual. 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 MRI shows Small 6 mm acute ischemic infarct involving the cortical gray matter of the high left frontal lobe in the region of the motor strip. Per neuro notes, this is an incidental finding, pt with Bell's Palsy   Pertinent Vitals Pain Assessment: No/denies pain  SLP Plan  Continue with current plan of care    Recommendations Diet recommendations: Dysphagia 1 (puree);Nectar-thick liquid Liquids provided via: Straw Medication Administration: Whole meds with puree Supervision: Patient able to self feed;Intermittent supervision to cue for compensatory  strategies Compensations: Slow rate;Small sips/bites;Check for pocketing;Check for anterior loss;Multiple dry swallows after each bite/sip Postural Changes and/or Swallow Maneuvers: Seated upright 90 degrees              Oral Care Recommendations: Oral care BID Follow up Recommendations: Inpatient Rehab Plan: Continue with current plan of care    GO     Houston Siren 03/17/2015, 11:27 AM  Orbie Pyo Colvin Caroli.Ed [employer] [phone]

## 2015-03-17 NOTE — Progress Notes (Signed)
[facility] here to pick up pt, pt taken downstairs via stretcher.

## 2015-03-17 NOTE — Clinical Social Work Placement (Signed)
   CLINICAL SOCIAL WORK PLACEMENT  NOTE  Date:  03/17/2015  Patient Details  Name: TARVARIS PUGLIA MRN: [id number] Date of Birth: [birth year]  Clinical Social Work is seeking post-discharge placement for this patient at the [place] level of care (*CSW will initial, date and re-position this form in  chart as items are completed):  Yes   Patient/family provided with [place] Work Department's list of facilities offering this level of care within the geographic area requested by the patient (or if unable, by the patient's family).  Yes   Patient/family informed of their freedom to choose among providers that offer the needed level of care, that participate in Medicare, Medicaid or managed care program needed by the patient, have an available bed and are willing to accept the patient.  Yes   Patient/family informed of [facility]'s ownership interest in [facility] and [facility], as well as of the fact that they are under no obligation to receive care at these facilities.  PASRR submitted to EDS on 03/16/15     PASRR number received on 03/16/15     Existing PASRR number confirmed on       FL2 transmitted to all facilities in geographic area requested by pt/family on       FL2 transmitted to all facilities within larger geographic area on       Patient informed that his/her managed care company has contracts with or will negotiate with certain facilities, including the following:        Yes   Patient/family informed of bed offers received.  Patient chooses bed at [facility]     Physician recommends and patient chooses bed at      Patient to be transferred to [facility] on 03/17/15.  Patient to be transferred to facility by [facility]      Patient family notified on 03/17/15 of transfer.  Name of family member notified:  Pt will notify his family      PHYSICIAN       Additional Comment:   Greta Doom, MSW, [place]

## 2015-03-17 NOTE — Progress Notes (Signed)
Discharge orders received, pt for discharge today to [facility].  IV and telemetry D/C.  D/C instructions and Rx in packet for receiving facility and report was attempted x3 beginning at 1413 and last call was to their Director of Nursing at current time.  Voicemail messages were left with both the supervisor and the DON with no call back as of this time.

## 2015-03-18 ENCOUNTER — Other Ambulatory Visit: Payer: Self-pay | Admitting: *Deleted

## 2015-03-18 ENCOUNTER — Non-Acute Institutional Stay (SKILLED_NURSING_FACILITY): Payer: Medicare Other | Admitting: Adult Health

## 2015-03-18 ENCOUNTER — Encounter: Payer: Self-pay | Admitting: Adult Health

## 2015-03-18 DIAGNOSIS — I639 Cerebral infarction, unspecified: Secondary | ICD-10-CM

## 2015-03-18 DIAGNOSIS — G510 Bell's palsy: Secondary | ICD-10-CM | POA: Diagnosis not present

## 2015-03-18 DIAGNOSIS — I10 Essential (primary) hypertension: Secondary | ICD-10-CM

## 2015-03-18 DIAGNOSIS — I6340 Cerebral infarction due to embolism of unspecified cerebral artery: Secondary | ICD-10-CM | POA: Diagnosis not present

## 2015-03-18 DIAGNOSIS — E876 Hypokalemia: Secondary | ICD-10-CM | POA: Diagnosis not present

## 2015-03-18 DIAGNOSIS — I5032 Chronic diastolic (congestive) heart failure: Secondary | ICD-10-CM

## 2015-03-18 DIAGNOSIS — E1142 Type 2 diabetes mellitus with diabetic polyneuropathy: Secondary | ICD-10-CM | POA: Diagnosis not present

## 2015-03-18 DIAGNOSIS — R600 Localized edema: Secondary | ICD-10-CM | POA: Diagnosis not present

## 2015-03-18 DIAGNOSIS — K5900 Constipation, unspecified: Secondary | ICD-10-CM | POA: Diagnosis not present

## 2015-03-18 DIAGNOSIS — G629 Polyneuropathy, unspecified: Secondary | ICD-10-CM

## 2015-03-18 DIAGNOSIS — I480 Paroxysmal atrial fibrillation: Secondary | ICD-10-CM | POA: Diagnosis not present

## 2015-03-18 DIAGNOSIS — G4700 Insomnia, unspecified: Secondary | ICD-10-CM | POA: Diagnosis not present

## 2015-03-18 MED ORDER — ZOLPIDEM TARTRATE 5 MG PO TABS: 5.0000 mg | ORAL_TABLET | Freq: Every evening | ORAL | Status: DC | PRN

## 2015-03-18 MED ORDER — HYDROCODONE-ACETAMINOPHEN 5-325 MG PO TABS: ORAL_TABLET | ORAL | Status: DC

## 2015-03-18 MED ORDER — PREGABALIN 50 MG PO CAPS: 50.0000 mg | ORAL_CAPSULE | Freq: Two times a day (BID) | ORAL | Status: DC

## 2015-03-18 NOTE — Progress Notes (Signed)
Patient ID: Jared Hall, male   DOB: Jul 02, 1931, 81 y.o.   MRN: [id number]   03/18/2015  Facility:  Nursing Home Location:  [place] Room Number: 1006-1 LEVEL OF CARE:  SNF (31)   Chief Complaint  Patient presents with  . Hospitalization Follow-up    Bell's palsy, embolic stroke, constipation, chronic diastolic heart failure, hypokalemia, neuropathy, hypertension, insomnia, diabetes mellitus, atrial fibrillation, lower extremity edema and leukocytosis    HISTORY OF PRESENT ILLNESS:  This is an 80 year old male who was been admitted to [facility] on 03/17/15 from [facility]. He has PMH of chronic lower extremity edema, diabetes mellitus, chronic diastolic heart failure, history of stroke, hypertension, paroxysmal atrial fibrillation and hyperlipidemia.  He was having dysphagia, dysarthria and left facial droop so he went to the ED for further evaluation. CT scan of brain on 03/14/15 showed no acute intracranial abnormality. MRI of the brain on 03/14/15 showed a small 6 mm acute ischemic infarct involving the cortical gray matter of the high left frontal lobe. He is currently on Plavix and ASA. He is not on anticoagulation due to high risk for falls. He was diagnosed, as well, with Bell's Palsy and was started on Prednisone and Valtrex. He complains of pain on his left side of his face and unable to close left eye. He had SVT on 4/25 and started on Lopressor.  He is seen in his room today and complains of difficulty swallowing and left facial pain.  He is noted to have left facial droop and dysarthric speech.  He has been admitted for a short-term rehabilitation.  PAST MEDICAL HISTORY:  Past Medical History  Diagnosis Date  . Umbilical hernia   . Hypertension   . High cholesterol   . Type II diabetes mellitus   . Stroke ~ 2005    denies residual on 2/123/2015  . Prostate cancer     S/P "8 weeks of radiation"  . CHF (congestive heart failure)   .  Onychomycosis   . BPH (benign prostatic hyperplasia)   . Urinary urgency     with incontinence  . Lower extremity edema   . Prostatitis     recurrent  . Sleep apnea   . Alpha thalassemia     CURRENT MEDICATIONS: Reviewed per MAR/see medication list  Allergies  Allergen Reactions  . Flomax [Tamsulosin Hcl] Other (See Comments)    Excessive tearing  . Glucophage [Metformin Hcl] Diarrhea     REVIEW OF SYSTEMS:  GENERAL: no change in appetite, no fatigue, no weight changes, no fever, chills or weakness RESPIRATORY: no cough, SOB, DOE, wheezing, hemoptysis CARDIAC: no chest pain, or palpitations GI: no abdominal pain, diarrhea, constipation, heart burn, nausea or vomiting  PHYSICAL EXAMINATION  GENERAL: no acute distress, obese EYES: conjunctivae normal, sclerae normal, normal eye lids NECK: supple, trachea midline, no neck masses, no thyroid tenderness, no thyromegaly LYMPHATICS: no LAN in the neck, no supraclavicular LAN RESPIRATORY: breathing is even & unlabored, BS CTAB CARDIAC: RRR, no murmur,no extra heart sounds, BLE edema 2+ GI: abdomen soft, normal BS, no masses, no tenderness, no hepatomegaly, no splenomegaly EXTREMITIES: able to move X 4 extremities; has bilateral unna boots NEURO:  Left facial droop with slight dysarthria PSYCHIATRIC: the patient is alert & oriented to person, affect & behavior appropriate  LABS/RADIOLOGY: Labs reviewed: Basic Metabolic Panel:  Recent Labs  12/06/14 1430 03/14/15 1935 03/15/15 0705  NA 140 139 138  K 4.1 4.2  3.9  CL 103 102 99  CO2 27 26 29   GLUCOSE 137* 129* 118*  BUN 17 17 14   CREATININE 1.02 1.21 1.11  CALCIUM 9.1 9.1 9.3   Liver Function Tests:  Recent Labs  12/06/14 1430 03/14/15 1935 03/15/15 0705  AST 55* 23 23  ALT 27 21 20   ALKPHOS 66 66 68  BILITOT 0.9 0.6 0.9  PROT 6.6 6.6 7.0  ALBUMIN 3.3* 3.6 3.6    Recent Labs  12/06/14 1854  AMMONIA 13   CBC:  Recent Labs  12/06/14 1430  03/14/15 1935 03/15/15 0705 03/16/15 0737  WBC 10.4 11.0* 10.9* 11.7*  NEUTROABS 7.7 8.0*  --   --   HGB 13.2 13.5 13.9 14.7  HCT 41.5 43.3 43.8 45.9  MCV 67.6* 69.1* 68.2* 68.4*  PLT 267 188 192 218   Lipid Panel:  Recent Labs  03/15/15 0705  HDL 37*    BNP: Invalid input(s): POCBNP CBG:  Recent Labs  03/16/15 2136 03/17/15 0644 03/17/15 1104  GLUCAP 322* 211* 311*    Dg Chest 2 View  03/15/2015   CLINICAL DATA:  Difficulty chewing and swallowing for 2 days. Possible stroke.  EXAM: CHEST  2 VIEW  COMPARISON:  01/11/2014.  FINDINGS: Telemetry leads overlie the chest.  Cardiomegaly. Mild vascular congestion. No active infiltrates or failure. Scarring LEFT base. No thoracic compression deformity. Skeletal osteopenia. Similar appearance to priors, except that the area previously noted of LEFT midlung zone scarring is improved.  IMPRESSION: Stable exam.  Cardiomegaly with mild vascular congestion.   Electronically Signed   By: Rolla Flatten M.D.   On: 03/15/2015 14:51   Ct Head (brain) Wo Contrast  03/14/2015   CLINICAL DATA:  Stroke symptoms.  Facial tingling and slurred speech  EXAM: CT HEAD WITHOUT CONTRAST  TECHNIQUE: Contiguous axial images were obtained from the base of the skull through the vertex without intravenous contrast.  COMPARISON:  CT head 12/06/2014  FINDINGS: Generalized atrophy. Negative for hydrocephalus. Moderate chronic microvascular ischemic changes in the white matter. Chronic infarct right inferior cerebellum.  Negative for acute infarct. Negative for hemorrhage or mass. Subdural hygroma around the right cerebellar hemisphere unchanged from prior studies  Calvarium intact.  IMPRESSION: Atrophy and chronic ischemia.  No acute abnormality.   Electronically Signed   By: Franchot Gallo M.D.   On: 03/14/2015 20:21   Mr Brain Wo Contrast  03/15/2015   CLINICAL DATA:  Initial evaluation for numbness and tingling tooth face with slurred speech.  EXAM: MRI HEAD  WITHOUT CONTRAST  TECHNIQUE: Multiplanar, multiecho pulse sequences of the brain and surrounding structures were obtained without intravenous contrast.  COMPARISON:  Prior CT from earlier the same day.  FINDINGS: Diffuse prominence of the CSF containing spaces is compatible with generalized cerebral atrophy. Patchy and confluent T2/FLAIR hyperintensity within the periventricular and deep white matter both cerebral hemispheres most consistent with chronic small vessel ischemic disease. There are remote lacunar infarcts involving the periventricular white matter bilaterally. Remote right cerebellar infarct present. Smaller are remote left cerebellar infarcts present.  There is a small focus of restricted diffusion measuring approximately 5 mm 6 mm involving the cortical gray matter of the high left frontal lobe in the region of the motor strip (series 3, image 42). Finding most consistent with a small acute ischemic infarct. This appears more linear in nature on corresponding coronal DWI sequence (series 5, image 14). No associated hemorrhage or mass effect. No other acute ischemic infarct. Normal intravascular flow voids  are maintained. No mass lesion or midline shift. No mass effect. No hydrocephalus. No extra-axial fluid collection.  Craniocervical junction within normal limits. Pituitary gland normal. No acute abnormality seen about the orbits. Paranasal sinuses are clear. Minimal fluid signal intensity noted within the left mastoid air cells. Inner ear structures normal.  Bone marrow signal intensity within normal limits. No scalp soft tissue abnormality.  IMPRESSION: 1. Small 6 mm acute ischemic infarct involving the cortical gray matter of the high left frontal lobe as above. No associated hemorrhage or mass effect. 2. No other acute intracranial process identified. 3. Advanced cerebral atrophy with chronic microvascular ischemic disease and multiple remote infarcts involving the periventricular white matter  bilateral cerebellar hemispheres.   Electronically Signed   By: Jeannine Boga M.D.   On: 03/15/2015 00:07   Dg Chest Port 1 View  03/16/2015   CLINICAL DATA:  Acute shortness of breath  EXAM: PORTABLE CHEST - 1 VIEW  COMPARISON:  03/15/2015  FINDINGS: Stable cardiomegaly and chronic left base scarring as before. Right lung remains clear. No definite enlarging effusion or pneumothorax. Negative for edema. Atherosclerosis noted of the aorta. Trachea is midline.  IMPRESSION: Stable cardiomegaly and left lower lobe scarring. No significant interval change.   Electronically Signed   By: Jerilynn Mages.  Shick M.D.   On: 03/16/2015 23:12   Dg Swallowing Func-speech Pathology  03/16/2015    Objective Swallowing Evaluation:    Patient Details  Name: JERIMIE MANCUSO MRN: [id number] Date of Birth: 11/02/1931  Today's Date: 03/16/2015 Time: SLP Start Time (ACUTE ONLY): 0920-SLP Stop Time (ACUTE ONLY): 0940 SLP Time Calculation (min) (ACUTE ONLY): 20 min  Past Medical History:  Past Medical History  Diagnosis Date  . Umbilical hernia   . Hypertension   . High cholesterol   . Type II diabetes mellitus   . Stroke ~ 2005    denies residual on 2/123/2015  . Prostate cancer     S/P "8 weeks of radiation"  . CHF (congestive heart failure)   . Onychomycosis   . BPH (benign prostatic hyperplasia)   . Urinary urgency     with incontinence  . Lower extremity edema   . Prostatitis     recurrent  . Sleep apnea   . Alpha thalassemia    Past Surgical History:  Past Surgical History  Procedure Laterality Date  . Ventral hernia repair  12/18/2011    Procedure: HERNIA REPAIR VENTRAL ADULT;  Surgeon: Adin Hector, MD;   Location: [place];  Service: General;  Laterality: N/A;  . Hernia repair    . Vasectomy     HPI:  HPI: ISADOR CASTILLE is an 81 y.o. male who reports that he was unable to eat  because things were falling out of the left side of his mouth and he was  choking on his food if he attempted to swallow. He reports that he is  unable to close  his left eye. He is numb on the left side of his face.  His left eye is tearing and he feels a fullness in his left ear. He  reports that he has had a stroke in the past, about 14 years ago, with no  residual. 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 MRI shows Small 6 mm acute ischemic infarct  involving the cortical gray matter of the high left frontal lobe in the  region of the motor strip. Per neuro notes, this is  an incidental finding,  pt with Bell's Palsy  No Data Recorded  Assessment / Plan / Recommendation CHL IP CLINICAL IMPRESSIONS 03/16/2015  Dysphagia Diagnosis Moderate oral phase dysphagia;Moderate pharyngeal  phase dysphagia  Clinical impression Pt demonstrates a moderate oral phase dysphagia  secondary to 7th cranial nerve palsy. Reduced labial closure and buccal  tension on the left lead to anterior spillage with cup sips and reduced  bolus cohesion with buccal residuals. Use of small straw sips and soft  moist solids improves bolus control. Mild oral residuals remain and fall  to valleculae post swallow, cleared with cued second swallow.  Oropharyngeal function also impaired due to primary sensory deficits with  delayed swallow initiation and silent aspiration of thin liquids before  the swallow. Chin tuck and head tilt to the right do not improve function.  Recommend a dys 2 (fine chop) diet with nectar thick liquids and second  swallow. SLP to f/u for diet tolerance and advancement.        CHL IP TREATMENT RECOMMENDATION 03/16/2015  Treatment Plan Recommendations Therapy as outlined in treatment plan below      CHL IP DIET RECOMMENDATION 03/16/2015  Diet Recommendations Dysphagia 1 (Puree);Nectar-thick liquid  Liquid Administration via Straw  Medication Administration Whole meds with puree  Compensations Slow rate;Small sips/bites;Check for pocketing;Check for  anterior loss;Multiple dry swallows after each bite/sip  Postural Changes and/or Swallow  Maneuvers Seated upright 90 degrees     CHL IP OTHER RECOMMENDATIONS 03/16/2015  Recommended Consults (None)  Oral Care Recommendations Oral care BID  Other Recommendations Order thickener from pharmacy     CHL IP FOLLOW UP RECOMMENDATIONS 03/16/2015  Follow up Recommendations Inpatient Rehab     CHL IP FREQUENCY AND DURATION 03/16/2015  Speech Therapy Frequency (ACUTE ONLY) min 2x/week  Treatment Duration 2 weeks     Pertinent Vitals/Pain NA    SLP Swallow Goals No flowsheet data found.  No flowsheet data found.    No flowsheet data found.   CHL IP ORAL PHASE 03/16/2015  Lips (None)  Tongue (None)  Mucous membranes (None)  Nutritional status (None)  Other (None)  Oxygen therapy (None)  Oral Phase Impaired  Oral - Pudding Teaspoon (None)  Oral - Pudding Cup (None)  Oral - Honey Teaspoon (None)  Oral - Honey Cup (None)  Oral - Honey Syringe (None)  Oral - Nectar Teaspoon (None)  Oral - Nectar Cup Left anterior bolus loss;Left pocketing in lateral sulci   Oral - Nectar Straw Left pocketing in lateral sulci  Oral - Nectar Syringe (None)  Oral - Ice Chips (None)  Oral - Thin Teaspoon (None)  Oral - Thin Cup Left anterior bolus loss;Left pocketing in lateral sulci  Oral - Thin Straw Left pocketing in lateral sulci  Oral - Thin Syringe (None)  Oral - Puree Left pocketing in lateral sulci  Oral - Mechanical Soft Left pocketing in lateral sulci  Oral - Regular (None)  Oral - Multi-consistency (None)  Oral - Pill Left anterior bolus loss;Left pocketing in lateral sulci  Oral Phase - Comment (None)      CHL IP PHARYNGEAL PHASE 03/16/2015  Pharyngeal Phase Impaired  Pharyngeal - Pudding Teaspoon (None)  Penetration/Aspiration details (pudding teaspoon) (None)  Pharyngeal - Pudding Cup (None)  Penetration/Aspiration details (pudding cup) (None)  Pharyngeal - Honey Teaspoon (None)  Penetration/Aspiration details (honey teaspoon) (None)  Pharyngeal - Honey Cup (None)  Penetration/Aspiration details (honey cup) (None)  Pharyngeal -  Honey Syringe (None)  Penetration/Aspiration details (honey syringe) (  None)  Pharyngeal - Nectar Teaspoon (None)  Penetration/Aspiration details (nectar teaspoon) (None)  Pharyngeal - Nectar Cup Delayed swallow initiation;Pharyngeal residue -  valleculae;Penetration/Aspiration before swallow  Penetration/Aspiration details (nectar cup) Material enters airway,  remains ABOVE vocal cords then ejected out  Pharyngeal - Nectar Straw Delayed swallow initiation;Pharyngeal residue -  valleculae  Penetration/Aspiration details (nectar straw) Material does not enter  airway  Pharyngeal - Nectar Syringe (None)  Penetration/Aspiration details (nectar syringe) (None)  Pharyngeal - Ice Chips (None)  Penetration/Aspiration details (ice chips) (None)  Pharyngeal - Thin Teaspoon (None)  Penetration/Aspiration details (thin teaspoon) (None)  Pharyngeal - Thin Cup Delayed swallow initiation;Pharyngeal residue -  valleculae;Penetration/Aspiration before swallow;Trace aspiration  Penetration/Aspiration details (thin cup) Material enters airway, passes  BELOW cords without attempt by patient to eject out (silent aspiration)  Pharyngeal - Thin Straw Delayed swallow initiation;Pharyngeal residue -  valleculae;Penetration/Aspiration before swallow  Penetration/Aspiration details (thin straw) Material enters airway, passes  BELOW cords without attempt by patient to eject out (silent aspiration)  Pharyngeal - Thin Syringe (None)  Penetration/Aspiration details (thin syringe') (None)  Pharyngeal - Puree Delayed swallow initiation  Penetration/Aspiration details (puree) (None)  Pharyngeal - Mechanical Soft Delayed swallow initiation  Penetration/Aspiration details (mechanical soft) (None)  Pharyngeal - Regular (None)  Penetration/Aspiration details (regular) (None)  Pharyngeal - Multi-consistency (None)  Penetration/Aspiration details (multi-consistency) (None)  Pharyngeal - Pill Delayed swallow initiation  Penetration/Aspiration details  (pill) (None)  Pharyngeal Comment (None)     No flowsheet data found.  No flowsheet data found.        [name], [place] CCC-SLP [phone]  Lynann Beaver 03/16/2015, 10:09 AM    Mr Jodene Nam Head/brain Wo Cm  03/15/2015   CLINICAL DATA:  81 year old male with facial weakness, trouble eating and swallowing. Recently detected small left frontal lobe infarct on 03/14/2015.  EXAM: MRA HEAD WITHOUT CONTRAST  TECHNIQUE: Angiographic images of the Circle of Willis were obtained using MRA technique without intravenous contrast.  COMPARISON:  Brain MRI 03/14/2015.  Head CT 03/14/2015 and earlier  FINDINGS: Antegrade flow in the posterior circulation with dominant distal left vertebral artery. Small distal right vertebral artery appears to remain patent, including the right PICA origin.  Patent basilar artery without stenosis. Bilateral fetal type PCA origins. Shared left P1 and SCA origin. These are patent. Bilateral PCA branches are within normal limits.  Tortuous distal cervical right ICA. Antegrade flow in both ICA siphons with no siphon stenosis. Normal ophthalmic and posterior communicating artery origins. Patent carotid termini. Normal MCA and ACA origins. Dominant left A1 segment. Anterior communicating artery and visualized bilateral ACA branches are within normal limits. Right MCA branches are within normal limits.  Patent left MCA M1 segment and bifurcation. Visualized left MCA branches are within normal limits.  IMPRESSION: Negative intracranial MRA.   Electronically Signed   By: Genevie Ann M.D.   On: 03/15/2015 11:34    ASSESSMENT/PLAN:  Bell's palsy - continue Prednisone and Valtrex X 7 days; for ST evaluation Embolic stroke - for rehabilitation; continue Plavix and aspirin; not a candidate for anticoagulation due to high fall risk Constipation - continue Colace 100 mg by mouth twice a day Chronic diastolic heart failure - transthoracic echocardiogram on 4/27 revealed EF 55%; continue Lasix 40 mg by  mouth daily Hypokalemia - K4.2; continue K-Dur 20 meq  by mouth daily Neuropathy - continue Lyrica 50 mg by mouth twice a day Hypertension - continue Hytrin 4 mg by mouth daily, Lasix 40 mg by mouth daily and  Lopressor 25 mg by mouth twice a day Insomnia - continue Ambien Diabetes mellitus, type II with neuropathy - hemoglobin A1c 8.5; continue Lantus 40 units subcutaneous twice a day; CBG 3 times a day and at bedtime PAF - well-controlled; continue Lopressor 25 mg by mouth twice a day Bilateral lower extremity edema -  continue Lasix Leukocytosis - wbc 11.7; currently on prednisone; will monitor    Goals of care:  Short-term rehabilitation   Labs/test ordered:  BMP and CBC   Spent 50 minutes in patient care.   [facility], NP [employer] [phone]

## 2015-03-18 NOTE — Telephone Encounter (Signed)
[facility] 

## 2015-03-19 ENCOUNTER — Non-Acute Institutional Stay (SKILLED_NURSING_FACILITY): Payer: Medicare Other | Admitting: Internal Medicine

## 2015-03-19 DIAGNOSIS — K5901 Slow transit constipation: Secondary | ICD-10-CM

## 2015-03-19 DIAGNOSIS — G629 Polyneuropathy, unspecified: Secondary | ICD-10-CM

## 2015-03-19 DIAGNOSIS — I10 Essential (primary) hypertension: Secondary | ICD-10-CM | POA: Diagnosis not present

## 2015-03-19 DIAGNOSIS — R196 Halitosis: Secondary | ICD-10-CM

## 2015-03-19 DIAGNOSIS — I480 Paroxysmal atrial fibrillation: Secondary | ICD-10-CM

## 2015-03-19 DIAGNOSIS — I5032 Chronic diastolic (congestive) heart failure: Secondary | ICD-10-CM | POA: Diagnosis not present

## 2015-03-19 DIAGNOSIS — G510 Bell's palsy: Secondary | ICD-10-CM | POA: Diagnosis not present

## 2015-03-19 DIAGNOSIS — R5381 Other malaise: Secondary | ICD-10-CM | POA: Diagnosis not present

## 2015-03-19 DIAGNOSIS — I878 Other specified disorders of veins: Secondary | ICD-10-CM

## 2015-03-19 DIAGNOSIS — E1142 Type 2 diabetes mellitus with diabetic polyneuropathy: Secondary | ICD-10-CM | POA: Diagnosis not present

## 2015-03-19 DIAGNOSIS — I639 Cerebral infarction, unspecified: Secondary | ICD-10-CM

## 2015-03-19 DIAGNOSIS — D72829 Elevated white blood cell count, unspecified: Secondary | ICD-10-CM

## 2015-03-19 DIAGNOSIS — I6340 Cerebral infarction due to embolism of unspecified cerebral artery: Secondary | ICD-10-CM | POA: Diagnosis not present

## 2015-03-19 NOTE — Progress Notes (Signed)
Patient ID: Jared Hall, male   DOB: [birth year], 81 y.o.   MRN: [id number]     [facility]   PCP: Henrine Screws, MD  Code Status: DNR  Allergies  Allergen Reactions  . Flomax [Tamsulosin Hcl] Other (See Comments)    Excessive tearing  . Glucophage [Metformin Hcl] Diarrhea    Chief Complaint  Patient presents with  . New Admit To SNF     HPI:  81 year old patient is here for short term rehabilitation post hospital admission from 03/14/15-03/17/15  With dysphagia, dysarthria and left facial droop. CT of brain showed no acute intracranial abnormality. MRI of the brain showed a small 6 mm acute ischemic infarct involving the cortical gray matter of the high left frontal lobe. He was treated for bell's palsy with valtrex and prednisone. He is currently on Plavix and ASA. He is not on anticoagulation due to high risk for falls. He is seen in his room today. He complains of pain on left side of his face and is unable to close left eye.  He has PMH of diabetes mellitus, chronic diastolic heart failure, afib, CVA, HLD and chronic venous stasis.   Review of Systems:  Constitutional: positive for malaise. Negative for fever, chills, diaphoresis.  HENT: Negative for headache, congestion, nasal discharge Eyes: Negative for eye pain, blurred vision, double vision and discharge.  Respiratory: Negative for cough, shortness of breath and wheezing.   Cardiovascular: Negative for chest pain, palpitations.positive for chronic leg swelling.  Gastrointestinal: Negative for heartburn, nausea, vomiting, abdominal pain. Had bowel movement yesterday. Genitourinary: Negative for dysuria Musculoskeletal: Negative for back pain, falls Skin: Negative for itching, rash.  Neurological: Negative for dizziness, tingling, focal weakness Psychiatric/Behavioral: Negative for depression   Past Medical History  Diagnosis Date  . Umbilical hernia   . Hypertension   . High  cholesterol   . Type II diabetes mellitus   . Stroke ~ 2005    denies residual on 2/123/2015  . Prostate cancer     S/P "8 weeks of radiation"  . CHF (congestive heart failure)   . Onychomycosis   . BPH (benign prostatic hyperplasia)   . Urinary urgency     with incontinence  . Lower extremity edema   . Prostatitis     recurrent  . Sleep apnea   . Alpha thalassemia    Past Surgical History  Procedure Laterality Date  . Ventral hernia repair  12/18/2011    Procedure: HERNIA REPAIR VENTRAL ADULT;  Surgeon: Adin Hector, MD;  Location: [facility];  Service: General;  Laterality: N/A;  . Hernia repair    . Vasectomy     Social History:   reports that he has never smoked. He has never used smokeless tobacco. He reports that he does not drink alcohol or use illicit drugs.  Family History  Problem Relation Age of Onset  . Pneumonia Mother   . Heart attack Father     Medications: Patient's Medications  New Prescriptions   No medications on file  Previous Medications   APIXABAN (ELIQUIS) 5 MG TABS TABLET    Take 1 tablet (5 mg total) by mouth 2 (two) times daily.   B COMPLEX VITAMINS (VITAMIN B COMPLEX PO)    Take 1 tablet by mouth daily.   DOCUSATE SODIUM (COLACE) 100 MG CAPSULE    Take 1 capsule (100 mg total) by mouth 2 (two) times daily.   FLUTICASONE (FLONASE) 50 MCG/ACT NASAL SPRAY  Place 1 spray into both nostrils daily.   FUROSEMIDE (LASIX) 80 MG TABLET    Take 40 mg by mouth daily.   HYDROCODONE-ACETAMINOPHEN (NORCO/VICODIN) 5-325 MG PER TABLET    Take two tablets by mouth every 4 hours as needed for pain. Do not exceed 4gm of Tylenol in 24 hours   INSULIN GLARGINE (LANTUS) 100 UNIT/ML INJECTION    Inject 0.4 mLs (40 Units total) into the skin 2 (two) times daily.   METOPROLOL TARTRATE (LOPRESSOR) 25 MG TABLET    Take 1 tablet (25 mg total) by mouth 2 (two) times daily.   POTASSIUM CHLORIDE SA (K-DUR,KLOR-CON) 20 MEQ TABLET    Take 20 mEq by mouth daily.   PREDNISONE  (STERAPRED UNI-PAK 21 TAB) 10 MG (21) TBPK TABLET    Take 60 mg PO q daily x 5 days then stop   PREGABALIN (LYRICA) 50 MG CAPSULE    Take 1 capsule (50 mg total) by mouth 2 (two) times daily.   TERAZOSIN (HYTRIN) 2 MG CAPSULE    Take 4 mg by mouth daily.   VALACYCLOVIR (VALTREX) 1000 MG TABLET    Take 1 tablet (1,000 mg total) by mouth 3 (three) times daily.   VITAMIN B-12 (CYANOCOBALAMIN) 1000 MCG TABLET    Take 1,000 mcg by mouth daily.   ZOLPIDEM (AMBIEN) 5 MG TABLET    Take 1 tablet (5 mg total) by mouth at bedtime as needed for sleep.  Modified Medications   No medications on file  Discontinued Medications   No medications on file     Physical Exam: Filed Vitals:   03/19/15 1503  BP: 139/64  Pulse: 63  Temp: 97 F (36.1 C)  Resp: 18  SpO2: 93%    General- elderly male, obese, in no acute distress Head- normocephalic, atraumatic Nose- normal nasal mucosa, no maxillary or frontal sinus tenderness, no nasal discharge Throat- moist mucus membrane, halitosis Eyes- PERRLA, EOMI, no pallor, no icterus, normal conjunctiva, normal sclera, unable to close left eye Neck- no cervical lymphadenopathy Cardiovascular- normal s1,s2, no murmurs, palpable dorsalis pedis and radial pulses,bilateral leg edema Respiratory- bilateral clear to auscultation, no wheeze, no rhonchi, no crackles, no use of accessory muscles Abdomen- bowel sounds present, soft, non tender, distended Musculoskeletal- able to move all 4 extremities, generalized weakness, bilateral leg edema, wrapped in una boots Neurological- left sided facial droop present, some dysarthria present Skin- warm and dry Psychiatry- alert and oriented to person, place and time, normal mood and affect    Labs reviewed: Basic Metabolic Panel:  Recent Labs  12/06/14 1430 03/14/15 1935 03/15/15 0705  NA 140 139 138  K 4.1 4.2 3.9  CL 103 102 99  CO2 27 26 29   GLUCOSE 137* 129* 118*  BUN 17 17 14   CREATININE 1.02 1.21 1.11    CALCIUM 9.1 9.1 9.3   Liver Function Tests:  Recent Labs  12/06/14 1430 03/14/15 1935 03/15/15 0705  AST 55* 23 23  ALT 27 21 20   ALKPHOS 66 66 68  BILITOT 0.9 0.6 0.9  PROT 6.6 6.6 7.0  ALBUMIN 3.3* 3.6 3.6   No results for input(s): LIPASE, AMYLASE in the last 8760 hours.  Recent Labs  12/06/14 1854  AMMONIA 13   CBC:  Recent Labs  12/06/14 1430 03/14/15 1935 03/15/15 0705 03/16/15 0737  WBC 10.4 11.0* 10.9* 11.7*  NEUTROABS 7.7 8.0*  --   --   HGB 13.2 13.5 13.9 14.7  HCT 41.5 43.3 43.8 45.9  MCV 67.6*  69.1* 68.2* 68.4*  PLT 267 188 192 218   Cardiac Enzymes: No results for input(s): CKTOTAL, CKMB, CKMBINDEX, TROPONINI in the last 8760 hours. BNP: Invalid input(s): POCBNP CBG:  Recent Labs  03/16/15 2136 03/17/15 0644 03/17/15 1104  GLUCAP 322* 211* 311*     Assessment/Plan  Physical deconditioning Will have him work with physical therapy and occupational therapy team to help with gait training and muscle strengthening exercises.fall precautions. Skin care. Encourage to be out of bed.   Embolic stroke Reviewed discharge summary. Patient to be on eliquis as per discussion of discharging physician with neurologist. Also a note in the system mentioning him being a high fall risk. Will d/c aspirin and plavix for now and have him on eliquis 5 mg bid. To get in touch with patient's PCP Dr Inda Merlin to review plan further on discharge from facility. Work with therapy team  Bell's palsy  continue and complete course of Prednisone and Valtrex. Continue tear drops for left eye. To work with SLP on dysarthria and dysphagia. Aspiration precautions. continue puree diet with nectar thick liquids  Halitosis To provide oral care tid after meals and use peridex swish and spit  Leukocytosis Likely from his steroid, monitor clinically  Constipation continue Colace 100 mg bid  Chronic diastolic heart failure transthoracic echocardiogram on 4/27 revealed EF 55%.  continue lopressor 25 mg bid, Lasix 40 mg daily with kcl supplement. Monitor bmp  Chronic venous stasis Continue skin ulcer care, continue una boots and lasix  afib Rate controlled. Continue lopressor. Continue eliquis  Hypertension  Stable, continue Hytrin 4 mg daily, Lasix 40 mg daily and Lopressor 25 mg bid  Diabetes mellitus type 2 with neuropathy hemoglobin A1c 8.5. continue Lantus 40 units bid, monitor cbg. continue Lyrica 50 mg bid for neuropathic pain    Goals of care: short term rehabilitation   Labs/tests ordered: cbc, bmp  Family/ staff Communication: reviewed care plan with patient and nursing supervisor    Blanchie Serve, MD  [facility] [phone] (Monday-Friday 8 am - 5 pm) [phone] (afterhours)

## 2015-04-21 DIAGNOSIS — N401 Enlarged prostate with lower urinary tract symptoms: Secondary | ICD-10-CM | POA: Diagnosis not present

## 2015-04-21 DIAGNOSIS — I10 Essential (primary) hypertension: Secondary | ICD-10-CM | POA: Diagnosis not present

## 2015-04-21 DIAGNOSIS — L03119 Cellulitis of unspecified part of limb: Secondary | ICD-10-CM | POA: Diagnosis not present

## 2015-04-21 DIAGNOSIS — E876 Hypokalemia: Secondary | ICD-10-CM | POA: Diagnosis not present

## 2015-04-21 DIAGNOSIS — R41841 Cognitive communication deficit: Secondary | ICD-10-CM | POA: Diagnosis not present

## 2015-04-21 DIAGNOSIS — M6281 Muscle weakness (generalized): Secondary | ICD-10-CM | POA: Diagnosis not present

## 2015-04-21 DIAGNOSIS — N189 Chronic kidney disease, unspecified: Secondary | ICD-10-CM | POA: Diagnosis not present

## 2015-04-21 DIAGNOSIS — R5381 Other malaise: Secondary | ICD-10-CM | POA: Diagnosis not present

## 2015-04-21 DIAGNOSIS — R471 Dysarthria and anarthria: Secondary | ICD-10-CM | POA: Diagnosis not present

## 2015-04-21 DIAGNOSIS — R2681 Unsteadiness on feet: Secondary | ICD-10-CM | POA: Diagnosis not present

## 2015-04-21 DIAGNOSIS — K5900 Constipation, unspecified: Secondary | ICD-10-CM | POA: Diagnosis not present

## 2015-04-21 DIAGNOSIS — R600 Localized edema: Secondary | ICD-10-CM | POA: Diagnosis not present

## 2015-04-21 DIAGNOSIS — G4700 Insomnia, unspecified: Secondary | ICD-10-CM | POA: Diagnosis not present

## 2015-04-21 DIAGNOSIS — I509 Heart failure, unspecified: Secondary | ICD-10-CM | POA: Diagnosis not present

## 2015-04-21 DIAGNOSIS — R1312 Dysphagia, oropharyngeal phase: Secondary | ICD-10-CM | POA: Diagnosis not present

## 2015-04-21 DIAGNOSIS — N179 Acute kidney failure, unspecified: Secondary | ICD-10-CM | POA: Diagnosis not present

## 2015-04-21 DIAGNOSIS — G4733 Obstructive sleep apnea (adult) (pediatric): Secondary | ICD-10-CM | POA: Diagnosis not present

## 2015-04-21 DIAGNOSIS — I5032 Chronic diastolic (congestive) heart failure: Secondary | ICD-10-CM | POA: Diagnosis not present

## 2015-04-21 DIAGNOSIS — I480 Paroxysmal atrial fibrillation: Secondary | ICD-10-CM | POA: Diagnosis not present

## 2015-04-21 DIAGNOSIS — E785 Hyperlipidemia, unspecified: Secondary | ICD-10-CM | POA: Diagnosis not present

## 2015-04-21 DIAGNOSIS — Z5189 Encounter for other specified aftercare: Secondary | ICD-10-CM | POA: Diagnosis not present

## 2015-04-21 DIAGNOSIS — I639 Cerebral infarction, unspecified: Secondary | ICD-10-CM | POA: Diagnosis not present

## 2015-04-21 DIAGNOSIS — G510 Bell's palsy: Secondary | ICD-10-CM | POA: Diagnosis not present

## 2015-04-21 DIAGNOSIS — E1142 Type 2 diabetes mellitus with diabetic polyneuropathy: Secondary | ICD-10-CM | POA: Diagnosis not present

## 2015-04-21 DIAGNOSIS — E1140 Type 2 diabetes mellitus with diabetic neuropathy, unspecified: Secondary | ICD-10-CM | POA: Diagnosis not present

## 2015-04-21 DIAGNOSIS — R278 Other lack of coordination: Secondary | ICD-10-CM | POA: Diagnosis not present

## 2015-04-21 DIAGNOSIS — I6340 Cerebral infarction due to embolism of unspecified cerebral artery: Secondary | ICD-10-CM | POA: Diagnosis not present

## 2015-04-22 ENCOUNTER — Encounter: Payer: Self-pay | Admitting: Adult Health

## 2015-04-22 ENCOUNTER — Non-Acute Institutional Stay (SKILLED_NURSING_FACILITY): Payer: Medicare Other | Admitting: Adult Health

## 2015-04-22 DIAGNOSIS — E1142 Type 2 diabetes mellitus with diabetic polyneuropathy: Secondary | ICD-10-CM

## 2015-04-22 DIAGNOSIS — I480 Paroxysmal atrial fibrillation: Secondary | ICD-10-CM | POA: Diagnosis not present

## 2015-04-22 DIAGNOSIS — R600 Localized edema: Secondary | ICD-10-CM | POA: Diagnosis not present

## 2015-04-22 DIAGNOSIS — G510 Bell's palsy: Secondary | ICD-10-CM | POA: Diagnosis not present

## 2015-04-22 DIAGNOSIS — I10 Essential (primary) hypertension: Secondary | ICD-10-CM | POA: Diagnosis not present

## 2015-04-22 DIAGNOSIS — R5381 Other malaise: Secondary | ICD-10-CM

## 2015-04-22 DIAGNOSIS — E876 Hypokalemia: Secondary | ICD-10-CM

## 2015-04-22 DIAGNOSIS — L03119 Cellulitis of unspecified part of limb: Secondary | ICD-10-CM | POA: Diagnosis not present

## 2015-04-22 DIAGNOSIS — K5900 Constipation, unspecified: Secondary | ICD-10-CM

## 2015-04-22 DIAGNOSIS — I639 Cerebral infarction, unspecified: Secondary | ICD-10-CM

## 2015-04-22 DIAGNOSIS — I6340 Cerebral infarction due to embolism of unspecified cerebral artery: Secondary | ICD-10-CM

## 2015-04-22 DIAGNOSIS — I5032 Chronic diastolic (congestive) heart failure: Secondary | ICD-10-CM

## 2015-04-22 DIAGNOSIS — G4700 Insomnia, unspecified: Secondary | ICD-10-CM | POA: Diagnosis not present

## 2015-04-22 DIAGNOSIS — G629 Polyneuropathy, unspecified: Secondary | ICD-10-CM

## 2015-04-22 NOTE — Progress Notes (Addendum)
Patient ID: Jared Hall, male   DOB: 10-May-1931, 81 y.o.   MRN: [id number]   04/22/2015  Facility:  Nursing Home Location:  [place] Room Number: 1006-1 LEVEL OF CARE:  SNF (31)   Chief Complaint  Patient presents with  . Discharge Note    Bell's palsy, embolic stroke, constipation, chronic diastolic heart failure, hypokalemia, neuropathy, hypertension, insomnia, diabetes mellitus, atrial fibrillation, lower extremity edema and cellulitis    HISTORY OF PRESENT ILLNESS:  This is an 81 year old male who is for discharge home with home health PT for ambulation, lower extremity strengthening, OT for self care, ST for dysphagia, dysarthria and cognition, nursing for disease management and home health aide for bathing/showers. He has been admitted to [facility] on 03/17/15 from [facility]. He has PMH of chronic lower extremity edema, diabetes mellitus, chronic diastolic heart failure, history of stroke, hypertension, paroxysmal atrial fibrillation and hyperlipidemia.  He was having dysphagia, dysarthria and left facial droop so he went to the ED for further evaluation. CT scan of brain on 03/14/15 showed no acute intracranial abnormality. MRI of the brain on 03/14/15 showed a small 6 mm acute ischemic infarct involving the cortical gray matter of the high left frontal lobe. He was recently started on Eliquis and ASA and Plavix were discontinued. He was diagnosed, as well, with Bell's Palsy. He had SVT on 4/25 and started on Lopressor. He was noted to have BLE cellulitis and currently taking doxycycline and to complete X 1 more week.  Patient was admitted to this facility for short-term rehabilitation after the patient's recent hospitalization.  Patient has completed SNF rehabilitation and therapy has cleared the patient for discharge.   PAST MEDICAL HISTORY:  Past Medical History  Diagnosis Date  . Umbilical hernia   . Hypertension   . High cholesterol   .  Type II diabetes mellitus   . Stroke ~ 2005    denies residual on 2/123/2015  . Prostate cancer     S/P "8 weeks of radiation"  . CHF (congestive heart failure)   . Onychomycosis   . BPH (benign prostatic hyperplasia)   . Urinary urgency     with incontinence  . Lower extremity edema   . Prostatitis     recurrent  . Sleep apnea   . Alpha thalassemia     CURRENT MEDICATIONS: Reviewed per MAR/see medication list  Allergies  Allergen Reactions  . Flomax [Tamsulosin Hcl] Other (See Comments)    Excessive tearing  . Glucophage [Metformin Hcl] Diarrhea     REVIEW OF SYSTEMS:  GENERAL: no change in appetite, no fatigue, no weight changes, no fever, chills or weakness RESPIRATORY: no cough, SOB, DOE, wheezing, hemoptysis CARDIAC: no chest pain, or palpitations GI: no abdominal pain, diarrhea, constipation, heart burn, nausea or vomiting  PHYSICAL EXAMINATION  GENERAL: no acute distress, obese NECK: supple, trachea midline, no neck masses, no thyroid tenderness, no thyromegaly LYMPHATICS: no LAN in the neck, no supraclavicular LAN RESPIRATORY: breathing is even & unlabored, BS CTAB CARDIAC: RRR, no murmur,no extra heart sounds, BLE edema 2+ GI: abdomen soft, normal BS, no masses, no tenderness, no hepatomegaly, no splenomegaly EXTREMITIES: able to move X 4 extremities; has bilateral unna boots NEURO:  Left facial droop with slight dysarthria PSYCHIATRIC: the patient is alert & oriented to person, affect & behavior appropriate  LABS/RADIOLOGY: Labs reviewed: 03/24/15  chest x-ray shows left basilar opacity and effusion likely representing acute infectious process -  treated with Levaquin 03/23/15  WBC 13.9 hemoglobin 14.0 hematocrit 43.6 MCV 67.5 03/19/15  WBC 11.0 hemoglobin 13.4 hematocrit 44.5 MCV 30.1 Basic Metabolic Panel:  Recent Labs  12/06/14 1430 03/14/15 1935 03/15/15 0705  NA 140 139 138  K 4.1 4.2 3.9  CL 103 102 99  CO2 27 26 29   GLUCOSE 137* 129* 118*    BUN 17 17 14   CREATININE 1.02 1.21 1.11  CALCIUM 9.1 9.1 9.3   Liver Function Tests:  Recent Labs  12/06/14 1430 03/14/15 1935 03/15/15 0705  AST 55* 23 23  ALT 27 21 20   ALKPHOS 66 66 68  BILITOT 0.9 0.6 0.9  PROT 6.6 6.6 7.0  ALBUMIN 3.3* 3.6 3.6    Recent Labs  12/06/14 1854  AMMONIA 13   CBC:  Recent Labs  12/06/14 1430 03/14/15 1935 03/15/15 0705 03/16/15 0737  WBC 10.4 11.0* 10.9* 11.7*  NEUTROABS 7.7 8.0*  --   --   HGB 13.2 13.5 13.9 14.7  HCT 41.5 43.3 43.8 45.9  MCV 67.6* 69.1* 68.2* 68.4*  PLT 267 188 192 218   Lipid Panel:  Recent Labs  03/15/15 0705  HDL 37*    CBG:  Recent Labs  03/16/15 2136 03/17/15 0644 03/17/15 1104  GLUCAP 322* 211* 311*    No results found.  ASSESSMENT/PLAN:  Physical deconditioning - for home health PT, OT, ST, nursing and home health aide Embolic stroke - continue Eliquis 5 mg 1 tab PO BID; follow-up with Dr. Erlinda Hong, neurology Bell's Palsy - follow-up with neurology Constipation - continue Colace 100 mg by mouth twice a day Chronic diastolic heart failure - transthoracic echocardiogram on 4/27 revealed EF 55%; continue Lasix 40 mg by mouth daily Hypokalemia - K4.2; continue K-Dur 20 meq  by mouth daily Hypertension - continue Hytrin 4 mg by mouth daily, Lasix 40 mg by mouth daily and Lopressor 25 mg by mouth twice a day Insomnia - continue Ambien 5 mg 1 tab PO Q HS PRN Diabetes mellitus, type II with neuropathy - hemoglobin A1c 8.5; continue Lantus 40 units subcutaneous twice a day and Lyrica 50 mg PO BID; CBG 3 times a day and at bedtime PAF - well-controlled; continue Lopressor 25 mg by mouth twice a day Bilateral lower extremity edema -  continue Lasix 40 mg PO Q D Cellulitis of BLE - continue Doxycycline 100 mg PO BID X 1 more week and Florastor 250 mg 1 capsule PO BID till 6/12  I have filled out patient's discharge paperwork and written prescriptions.  Patient will receive home health PT, OT, ST,  Nursing and Home health aide.  Total discharge time: Greater than 30 minutes  Discharge time involved coordination of the discharge process with social worker, nursing staff and therapy department. Medical justification for home health services verified.  [facility], NP [employer] [phone]

## 2015-04-23 DIAGNOSIS — I4891 Unspecified atrial fibrillation: Secondary | ICD-10-CM | POA: Diagnosis not present

## 2015-04-23 DIAGNOSIS — Z8546 Personal history of malignant neoplasm of prostate: Secondary | ICD-10-CM | POA: Diagnosis not present

## 2015-04-23 DIAGNOSIS — I69322 Dysarthria following cerebral infarction: Secondary | ICD-10-CM | POA: Diagnosis not present

## 2015-04-23 DIAGNOSIS — Z794 Long term (current) use of insulin: Secondary | ICD-10-CM | POA: Diagnosis not present

## 2015-04-23 DIAGNOSIS — I5032 Chronic diastolic (congestive) heart failure: Secondary | ICD-10-CM | POA: Diagnosis not present

## 2015-04-23 DIAGNOSIS — I10 Essential (primary) hypertension: Secondary | ICD-10-CM | POA: Diagnosis not present

## 2015-04-23 DIAGNOSIS — I69391 Dysphagia following cerebral infarction: Secondary | ICD-10-CM | POA: Diagnosis not present

## 2015-04-23 DIAGNOSIS — R531 Weakness: Secondary | ICD-10-CM | POA: Diagnosis not present

## 2015-04-23 DIAGNOSIS — E1140 Type 2 diabetes mellitus with diabetic neuropathy, unspecified: Secondary | ICD-10-CM | POA: Diagnosis not present

## 2015-04-27 DIAGNOSIS — I10 Essential (primary) hypertension: Secondary | ICD-10-CM | POA: Diagnosis not present

## 2015-04-27 DIAGNOSIS — R531 Weakness: Secondary | ICD-10-CM | POA: Diagnosis not present

## 2015-04-27 DIAGNOSIS — E1140 Type 2 diabetes mellitus with diabetic neuropathy, unspecified: Secondary | ICD-10-CM | POA: Diagnosis not present

## 2015-04-27 DIAGNOSIS — I5032 Chronic diastolic (congestive) heart failure: Secondary | ICD-10-CM | POA: Diagnosis not present

## 2015-04-27 DIAGNOSIS — I69322 Dysarthria following cerebral infarction: Secondary | ICD-10-CM | POA: Diagnosis not present

## 2015-04-27 DIAGNOSIS — I69391 Dysphagia following cerebral infarction: Secondary | ICD-10-CM | POA: Diagnosis not present

## 2015-04-28 DIAGNOSIS — R531 Weakness: Secondary | ICD-10-CM | POA: Diagnosis not present

## 2015-04-28 DIAGNOSIS — I5032 Chronic diastolic (congestive) heart failure: Secondary | ICD-10-CM | POA: Diagnosis not present

## 2015-04-28 DIAGNOSIS — I69322 Dysarthria following cerebral infarction: Secondary | ICD-10-CM | POA: Diagnosis not present

## 2015-04-28 DIAGNOSIS — I10 Essential (primary) hypertension: Secondary | ICD-10-CM | POA: Diagnosis not present

## 2015-04-28 DIAGNOSIS — E1140 Type 2 diabetes mellitus with diabetic neuropathy, unspecified: Secondary | ICD-10-CM | POA: Diagnosis not present

## 2015-04-28 DIAGNOSIS — I69391 Dysphagia following cerebral infarction: Secondary | ICD-10-CM | POA: Diagnosis not present

## 2015-04-29 DIAGNOSIS — I69391 Dysphagia following cerebral infarction: Secondary | ICD-10-CM | POA: Diagnosis not present

## 2015-04-29 DIAGNOSIS — E1140 Type 2 diabetes mellitus with diabetic neuropathy, unspecified: Secondary | ICD-10-CM | POA: Diagnosis not present

## 2015-04-29 DIAGNOSIS — I5032 Chronic diastolic (congestive) heart failure: Secondary | ICD-10-CM | POA: Diagnosis not present

## 2015-04-29 DIAGNOSIS — I69322 Dysarthria following cerebral infarction: Secondary | ICD-10-CM | POA: Diagnosis not present

## 2015-04-29 DIAGNOSIS — I10 Essential (primary) hypertension: Secondary | ICD-10-CM | POA: Diagnosis not present

## 2015-04-29 DIAGNOSIS — R531 Weakness: Secondary | ICD-10-CM | POA: Diagnosis not present

## 2015-04-30 DIAGNOSIS — E1140 Type 2 diabetes mellitus with diabetic neuropathy, unspecified: Secondary | ICD-10-CM | POA: Diagnosis not present

## 2015-04-30 DIAGNOSIS — I10 Essential (primary) hypertension: Secondary | ICD-10-CM | POA: Diagnosis not present

## 2015-04-30 DIAGNOSIS — I69391 Dysphagia following cerebral infarction: Secondary | ICD-10-CM | POA: Diagnosis not present

## 2015-04-30 DIAGNOSIS — R531 Weakness: Secondary | ICD-10-CM | POA: Diagnosis not present

## 2015-04-30 DIAGNOSIS — I69322 Dysarthria following cerebral infarction: Secondary | ICD-10-CM | POA: Diagnosis not present

## 2015-04-30 DIAGNOSIS — I5032 Chronic diastolic (congestive) heart failure: Secondary | ICD-10-CM | POA: Diagnosis not present

## 2015-05-03 DIAGNOSIS — R531 Weakness: Secondary | ICD-10-CM | POA: Diagnosis not present

## 2015-05-03 DIAGNOSIS — E1140 Type 2 diabetes mellitus with diabetic neuropathy, unspecified: Secondary | ICD-10-CM | POA: Diagnosis not present

## 2015-05-03 DIAGNOSIS — I10 Essential (primary) hypertension: Secondary | ICD-10-CM | POA: Diagnosis not present

## 2015-05-03 DIAGNOSIS — I69322 Dysarthria following cerebral infarction: Secondary | ICD-10-CM | POA: Diagnosis not present

## 2015-05-03 DIAGNOSIS — I69391 Dysphagia following cerebral infarction: Secondary | ICD-10-CM | POA: Diagnosis not present

## 2015-05-03 DIAGNOSIS — I5032 Chronic diastolic (congestive) heart failure: Secondary | ICD-10-CM | POA: Diagnosis not present

## 2015-05-04 DIAGNOSIS — E1140 Type 2 diabetes mellitus with diabetic neuropathy, unspecified: Secondary | ICD-10-CM | POA: Diagnosis not present

## 2015-05-04 DIAGNOSIS — I10 Essential (primary) hypertension: Secondary | ICD-10-CM | POA: Diagnosis not present

## 2015-05-04 DIAGNOSIS — I69391 Dysphagia following cerebral infarction: Secondary | ICD-10-CM | POA: Diagnosis not present

## 2015-05-04 DIAGNOSIS — I69322 Dysarthria following cerebral infarction: Secondary | ICD-10-CM | POA: Diagnosis not present

## 2015-05-04 DIAGNOSIS — I5032 Chronic diastolic (congestive) heart failure: Secondary | ICD-10-CM | POA: Diagnosis not present

## 2015-05-04 DIAGNOSIS — R531 Weakness: Secondary | ICD-10-CM | POA: Diagnosis not present

## 2015-05-05 DIAGNOSIS — R531 Weakness: Secondary | ICD-10-CM | POA: Diagnosis not present

## 2015-05-05 DIAGNOSIS — E1140 Type 2 diabetes mellitus with diabetic neuropathy, unspecified: Secondary | ICD-10-CM | POA: Diagnosis not present

## 2015-05-05 DIAGNOSIS — R0902 Hypoxemia: Secondary | ICD-10-CM | POA: Diagnosis not present

## 2015-05-05 DIAGNOSIS — I69322 Dysarthria following cerebral infarction: Secondary | ICD-10-CM | POA: Diagnosis not present

## 2015-05-05 DIAGNOSIS — I69391 Dysphagia following cerebral infarction: Secondary | ICD-10-CM | POA: Diagnosis not present

## 2015-05-05 DIAGNOSIS — I10 Essential (primary) hypertension: Secondary | ICD-10-CM | POA: Diagnosis not present

## 2015-05-05 DIAGNOSIS — I5030 Unspecified diastolic (congestive) heart failure: Secondary | ICD-10-CM | POA: Diagnosis not present

## 2015-05-05 DIAGNOSIS — I5032 Chronic diastolic (congestive) heart failure: Secondary | ICD-10-CM | POA: Diagnosis not present

## 2015-05-06 DIAGNOSIS — E1140 Type 2 diabetes mellitus with diabetic neuropathy, unspecified: Secondary | ICD-10-CM | POA: Diagnosis not present

## 2015-05-06 DIAGNOSIS — R531 Weakness: Secondary | ICD-10-CM | POA: Diagnosis not present

## 2015-05-06 DIAGNOSIS — I5032 Chronic diastolic (congestive) heart failure: Secondary | ICD-10-CM | POA: Diagnosis not present

## 2015-05-06 DIAGNOSIS — I69322 Dysarthria following cerebral infarction: Secondary | ICD-10-CM | POA: Diagnosis not present

## 2015-05-06 DIAGNOSIS — I10 Essential (primary) hypertension: Secondary | ICD-10-CM | POA: Diagnosis not present

## 2015-05-06 DIAGNOSIS — I69391 Dysphagia following cerebral infarction: Secondary | ICD-10-CM | POA: Diagnosis not present

## 2015-05-10 DIAGNOSIS — R531 Weakness: Secondary | ICD-10-CM | POA: Diagnosis not present

## 2015-05-10 DIAGNOSIS — I5032 Chronic diastolic (congestive) heart failure: Secondary | ICD-10-CM | POA: Diagnosis not present

## 2015-05-10 DIAGNOSIS — I69391 Dysphagia following cerebral infarction: Secondary | ICD-10-CM | POA: Diagnosis not present

## 2015-05-10 DIAGNOSIS — I69322 Dysarthria following cerebral infarction: Secondary | ICD-10-CM | POA: Diagnosis not present

## 2015-05-10 DIAGNOSIS — E1140 Type 2 diabetes mellitus with diabetic neuropathy, unspecified: Secondary | ICD-10-CM | POA: Diagnosis not present

## 2015-05-10 DIAGNOSIS — I10 Essential (primary) hypertension: Secondary | ICD-10-CM | POA: Diagnosis not present

## 2015-05-11 DIAGNOSIS — E1140 Type 2 diabetes mellitus with diabetic neuropathy, unspecified: Secondary | ICD-10-CM | POA: Diagnosis not present

## 2015-05-11 DIAGNOSIS — I69391 Dysphagia following cerebral infarction: Secondary | ICD-10-CM | POA: Diagnosis not present

## 2015-05-11 DIAGNOSIS — R531 Weakness: Secondary | ICD-10-CM | POA: Diagnosis not present

## 2015-05-11 DIAGNOSIS — I69322 Dysarthria following cerebral infarction: Secondary | ICD-10-CM | POA: Diagnosis not present

## 2015-05-11 DIAGNOSIS — I5032 Chronic diastolic (congestive) heart failure: Secondary | ICD-10-CM | POA: Diagnosis not present

## 2015-05-11 DIAGNOSIS — I10 Essential (primary) hypertension: Secondary | ICD-10-CM | POA: Diagnosis not present

## 2015-05-12 DIAGNOSIS — I5032 Chronic diastolic (congestive) heart failure: Secondary | ICD-10-CM | POA: Diagnosis not present

## 2015-05-12 DIAGNOSIS — R531 Weakness: Secondary | ICD-10-CM | POA: Diagnosis not present

## 2015-05-12 DIAGNOSIS — I10 Essential (primary) hypertension: Secondary | ICD-10-CM | POA: Diagnosis not present

## 2015-05-12 DIAGNOSIS — I69391 Dysphagia following cerebral infarction: Secondary | ICD-10-CM | POA: Diagnosis not present

## 2015-05-12 DIAGNOSIS — E1140 Type 2 diabetes mellitus with diabetic neuropathy, unspecified: Secondary | ICD-10-CM | POA: Diagnosis not present

## 2015-05-12 DIAGNOSIS — I69322 Dysarthria following cerebral infarction: Secondary | ICD-10-CM | POA: Diagnosis not present

## 2015-05-13 DIAGNOSIS — I69391 Dysphagia following cerebral infarction: Secondary | ICD-10-CM | POA: Diagnosis not present

## 2015-05-13 DIAGNOSIS — I10 Essential (primary) hypertension: Secondary | ICD-10-CM | POA: Diagnosis not present

## 2015-05-13 DIAGNOSIS — R531 Weakness: Secondary | ICD-10-CM | POA: Diagnosis not present

## 2015-05-13 DIAGNOSIS — I69322 Dysarthria following cerebral infarction: Secondary | ICD-10-CM | POA: Diagnosis not present

## 2015-05-13 DIAGNOSIS — E1140 Type 2 diabetes mellitus with diabetic neuropathy, unspecified: Secondary | ICD-10-CM | POA: Diagnosis not present

## 2015-05-13 DIAGNOSIS — I5032 Chronic diastolic (congestive) heart failure: Secondary | ICD-10-CM | POA: Diagnosis not present

## 2015-05-14 DIAGNOSIS — G510 Bell's palsy: Secondary | ICD-10-CM | POA: Diagnosis not present

## 2015-05-14 DIAGNOSIS — I69391 Dysphagia following cerebral infarction: Secondary | ICD-10-CM | POA: Diagnosis not present

## 2015-05-14 DIAGNOSIS — I10 Essential (primary) hypertension: Secondary | ICD-10-CM | POA: Diagnosis not present

## 2015-05-14 DIAGNOSIS — Z794 Long term (current) use of insulin: Secondary | ICD-10-CM | POA: Diagnosis not present

## 2015-05-14 DIAGNOSIS — I129 Hypertensive chronic kidney disease with stage 1 through stage 4 chronic kidney disease, or unspecified chronic kidney disease: Secondary | ICD-10-CM | POA: Diagnosis not present

## 2015-05-14 DIAGNOSIS — G4730 Sleep apnea, unspecified: Secondary | ICD-10-CM | POA: Diagnosis not present

## 2015-05-14 DIAGNOSIS — Z136 Encounter for screening for cardiovascular disorders: Secondary | ICD-10-CM | POA: Diagnosis not present

## 2015-05-14 DIAGNOSIS — D510 Vitamin B12 deficiency anemia due to intrinsic factor deficiency: Secondary | ICD-10-CM | POA: Diagnosis not present

## 2015-05-14 DIAGNOSIS — E0840 Diabetes mellitus due to underlying condition with diabetic neuropathy, unspecified: Secondary | ICD-10-CM | POA: Diagnosis not present

## 2015-05-14 DIAGNOSIS — R531 Weakness: Secondary | ICD-10-CM | POA: Diagnosis not present

## 2015-05-14 DIAGNOSIS — I5032 Chronic diastolic (congestive) heart failure: Secondary | ICD-10-CM | POA: Diagnosis not present

## 2015-05-14 DIAGNOSIS — B351 Tinea unguium: Secondary | ICD-10-CM | POA: Diagnosis not present

## 2015-05-14 DIAGNOSIS — E1140 Type 2 diabetes mellitus with diabetic neuropathy, unspecified: Secondary | ICD-10-CM | POA: Diagnosis not present

## 2015-05-14 DIAGNOSIS — I69322 Dysarthria following cerebral infarction: Secondary | ICD-10-CM | POA: Diagnosis not present

## 2015-05-14 DIAGNOSIS — M179 Osteoarthritis of knee, unspecified: Secondary | ICD-10-CM | POA: Diagnosis not present

## 2015-05-17 DIAGNOSIS — R531 Weakness: Secondary | ICD-10-CM | POA: Diagnosis not present

## 2015-05-17 DIAGNOSIS — I69391 Dysphagia following cerebral infarction: Secondary | ICD-10-CM | POA: Diagnosis not present

## 2015-05-17 DIAGNOSIS — E1140 Type 2 diabetes mellitus with diabetic neuropathy, unspecified: Secondary | ICD-10-CM | POA: Diagnosis not present

## 2015-05-17 DIAGNOSIS — I69322 Dysarthria following cerebral infarction: Secondary | ICD-10-CM | POA: Diagnosis not present

## 2015-05-17 DIAGNOSIS — I10 Essential (primary) hypertension: Secondary | ICD-10-CM | POA: Diagnosis not present

## 2015-05-17 DIAGNOSIS — I5032 Chronic diastolic (congestive) heart failure: Secondary | ICD-10-CM | POA: Diagnosis not present

## 2015-05-18 DIAGNOSIS — I69391 Dysphagia following cerebral infarction: Secondary | ICD-10-CM | POA: Diagnosis not present

## 2015-05-18 DIAGNOSIS — I5032 Chronic diastolic (congestive) heart failure: Secondary | ICD-10-CM | POA: Diagnosis not present

## 2015-05-18 DIAGNOSIS — E1140 Type 2 diabetes mellitus with diabetic neuropathy, unspecified: Secondary | ICD-10-CM | POA: Diagnosis not present

## 2015-05-18 DIAGNOSIS — I69322 Dysarthria following cerebral infarction: Secondary | ICD-10-CM | POA: Diagnosis not present

## 2015-05-18 DIAGNOSIS — I10 Essential (primary) hypertension: Secondary | ICD-10-CM | POA: Diagnosis not present

## 2015-05-18 DIAGNOSIS — R531 Weakness: Secondary | ICD-10-CM | POA: Diagnosis not present

## 2015-05-20 DIAGNOSIS — I5032 Chronic diastolic (congestive) heart failure: Secondary | ICD-10-CM | POA: Diagnosis not present

## 2015-05-20 DIAGNOSIS — R531 Weakness: Secondary | ICD-10-CM | POA: Diagnosis not present

## 2015-05-20 DIAGNOSIS — E1140 Type 2 diabetes mellitus with diabetic neuropathy, unspecified: Secondary | ICD-10-CM | POA: Diagnosis not present

## 2015-05-20 DIAGNOSIS — I69391 Dysphagia following cerebral infarction: Secondary | ICD-10-CM | POA: Diagnosis not present

## 2015-05-20 DIAGNOSIS — I10 Essential (primary) hypertension: Secondary | ICD-10-CM | POA: Diagnosis not present

## 2015-05-20 DIAGNOSIS — I69322 Dysarthria following cerebral infarction: Secondary | ICD-10-CM | POA: Diagnosis not present

## 2015-05-24 DIAGNOSIS — E1140 Type 2 diabetes mellitus with diabetic neuropathy, unspecified: Secondary | ICD-10-CM | POA: Diagnosis not present

## 2015-05-24 DIAGNOSIS — I5032 Chronic diastolic (congestive) heart failure: Secondary | ICD-10-CM | POA: Diagnosis not present

## 2015-05-24 DIAGNOSIS — I69322 Dysarthria following cerebral infarction: Secondary | ICD-10-CM | POA: Diagnosis not present

## 2015-05-24 DIAGNOSIS — R531 Weakness: Secondary | ICD-10-CM | POA: Diagnosis not present

## 2015-05-24 DIAGNOSIS — I10 Essential (primary) hypertension: Secondary | ICD-10-CM | POA: Diagnosis not present

## 2015-05-24 DIAGNOSIS — I69391 Dysphagia following cerebral infarction: Secondary | ICD-10-CM | POA: Diagnosis not present

## 2015-05-25 DIAGNOSIS — R531 Weakness: Secondary | ICD-10-CM | POA: Diagnosis not present

## 2015-05-25 DIAGNOSIS — I5032 Chronic diastolic (congestive) heart failure: Secondary | ICD-10-CM | POA: Diagnosis not present

## 2015-05-25 DIAGNOSIS — E1140 Type 2 diabetes mellitus with diabetic neuropathy, unspecified: Secondary | ICD-10-CM | POA: Diagnosis not present

## 2015-05-25 DIAGNOSIS — I69391 Dysphagia following cerebral infarction: Secondary | ICD-10-CM | POA: Diagnosis not present

## 2015-05-25 DIAGNOSIS — I10 Essential (primary) hypertension: Secondary | ICD-10-CM | POA: Diagnosis not present

## 2015-05-25 DIAGNOSIS — I69322 Dysarthria following cerebral infarction: Secondary | ICD-10-CM | POA: Diagnosis not present

## 2015-05-27 DIAGNOSIS — E1140 Type 2 diabetes mellitus with diabetic neuropathy, unspecified: Secondary | ICD-10-CM | POA: Diagnosis not present

## 2015-05-27 DIAGNOSIS — R531 Weakness: Secondary | ICD-10-CM | POA: Diagnosis not present

## 2015-05-27 DIAGNOSIS — I10 Essential (primary) hypertension: Secondary | ICD-10-CM | POA: Diagnosis not present

## 2015-05-27 DIAGNOSIS — I5032 Chronic diastolic (congestive) heart failure: Secondary | ICD-10-CM | POA: Diagnosis not present

## 2015-05-27 DIAGNOSIS — I69391 Dysphagia following cerebral infarction: Secondary | ICD-10-CM | POA: Diagnosis not present

## 2015-05-27 DIAGNOSIS — I69322 Dysarthria following cerebral infarction: Secondary | ICD-10-CM | POA: Diagnosis not present

## 2015-06-01 DIAGNOSIS — R531 Weakness: Secondary | ICD-10-CM | POA: Diagnosis not present

## 2015-06-01 DIAGNOSIS — I10 Essential (primary) hypertension: Secondary | ICD-10-CM | POA: Diagnosis not present

## 2015-06-01 DIAGNOSIS — I5032 Chronic diastolic (congestive) heart failure: Secondary | ICD-10-CM | POA: Diagnosis not present

## 2015-06-01 DIAGNOSIS — I69391 Dysphagia following cerebral infarction: Secondary | ICD-10-CM | POA: Diagnosis not present

## 2015-06-01 DIAGNOSIS — E1140 Type 2 diabetes mellitus with diabetic neuropathy, unspecified: Secondary | ICD-10-CM | POA: Diagnosis not present

## 2015-06-01 DIAGNOSIS — I69322 Dysarthria following cerebral infarction: Secondary | ICD-10-CM | POA: Diagnosis not present

## 2015-06-02 ENCOUNTER — Other Ambulatory Visit: Payer: Self-pay | Admitting: Adult Health

## 2015-06-02 DIAGNOSIS — R531 Weakness: Secondary | ICD-10-CM | POA: Diagnosis not present

## 2015-06-02 DIAGNOSIS — I10 Essential (primary) hypertension: Secondary | ICD-10-CM | POA: Diagnosis not present

## 2015-06-02 DIAGNOSIS — E1140 Type 2 diabetes mellitus with diabetic neuropathy, unspecified: Secondary | ICD-10-CM | POA: Diagnosis not present

## 2015-06-02 DIAGNOSIS — I69391 Dysphagia following cerebral infarction: Secondary | ICD-10-CM | POA: Diagnosis not present

## 2015-06-02 DIAGNOSIS — I5032 Chronic diastolic (congestive) heart failure: Secondary | ICD-10-CM | POA: Diagnosis not present

## 2015-06-02 DIAGNOSIS — I69322 Dysarthria following cerebral infarction: Secondary | ICD-10-CM | POA: Diagnosis not present

## 2015-06-03 DIAGNOSIS — I69391 Dysphagia following cerebral infarction: Secondary | ICD-10-CM | POA: Diagnosis not present

## 2015-06-03 DIAGNOSIS — I69322 Dysarthria following cerebral infarction: Secondary | ICD-10-CM | POA: Diagnosis not present

## 2015-06-03 DIAGNOSIS — I5032 Chronic diastolic (congestive) heart failure: Secondary | ICD-10-CM | POA: Diagnosis not present

## 2015-06-03 DIAGNOSIS — R531 Weakness: Secondary | ICD-10-CM | POA: Diagnosis not present

## 2015-06-03 DIAGNOSIS — I10 Essential (primary) hypertension: Secondary | ICD-10-CM | POA: Diagnosis not present

## 2015-06-03 DIAGNOSIS — E1140 Type 2 diabetes mellitus with diabetic neuropathy, unspecified: Secondary | ICD-10-CM | POA: Diagnosis not present

## 2015-06-04 DIAGNOSIS — R531 Weakness: Secondary | ICD-10-CM | POA: Diagnosis not present

## 2015-06-04 DIAGNOSIS — I10 Essential (primary) hypertension: Secondary | ICD-10-CM | POA: Diagnosis not present

## 2015-06-04 DIAGNOSIS — I5032 Chronic diastolic (congestive) heart failure: Secondary | ICD-10-CM | POA: Diagnosis not present

## 2015-06-04 DIAGNOSIS — I69322 Dysarthria following cerebral infarction: Secondary | ICD-10-CM | POA: Diagnosis not present

## 2015-06-04 DIAGNOSIS — E1140 Type 2 diabetes mellitus with diabetic neuropathy, unspecified: Secondary | ICD-10-CM | POA: Diagnosis not present

## 2015-06-04 DIAGNOSIS — I69391 Dysphagia following cerebral infarction: Secondary | ICD-10-CM | POA: Diagnosis not present

## 2015-06-08 DIAGNOSIS — I69322 Dysarthria following cerebral infarction: Secondary | ICD-10-CM | POA: Diagnosis not present

## 2015-06-08 DIAGNOSIS — I10 Essential (primary) hypertension: Secondary | ICD-10-CM | POA: Diagnosis not present

## 2015-06-08 DIAGNOSIS — R531 Weakness: Secondary | ICD-10-CM | POA: Diagnosis not present

## 2015-06-08 DIAGNOSIS — I5032 Chronic diastolic (congestive) heart failure: Secondary | ICD-10-CM | POA: Diagnosis not present

## 2015-06-08 DIAGNOSIS — I69391 Dysphagia following cerebral infarction: Secondary | ICD-10-CM | POA: Diagnosis not present

## 2015-06-08 DIAGNOSIS — E1140 Type 2 diabetes mellitus with diabetic neuropathy, unspecified: Secondary | ICD-10-CM | POA: Diagnosis not present

## 2015-06-09 DIAGNOSIS — I69391 Dysphagia following cerebral infarction: Secondary | ICD-10-CM | POA: Diagnosis not present

## 2015-06-09 DIAGNOSIS — I69322 Dysarthria following cerebral infarction: Secondary | ICD-10-CM | POA: Diagnosis not present

## 2015-06-09 DIAGNOSIS — R531 Weakness: Secondary | ICD-10-CM | POA: Diagnosis not present

## 2015-06-09 DIAGNOSIS — E1140 Type 2 diabetes mellitus with diabetic neuropathy, unspecified: Secondary | ICD-10-CM | POA: Diagnosis not present

## 2015-06-09 DIAGNOSIS — I10 Essential (primary) hypertension: Secondary | ICD-10-CM | POA: Diagnosis not present

## 2015-06-09 DIAGNOSIS — I5032 Chronic diastolic (congestive) heart failure: Secondary | ICD-10-CM | POA: Diagnosis not present

## 2015-06-10 DIAGNOSIS — R531 Weakness: Secondary | ICD-10-CM | POA: Diagnosis not present

## 2015-06-10 DIAGNOSIS — I69322 Dysarthria following cerebral infarction: Secondary | ICD-10-CM | POA: Diagnosis not present

## 2015-06-10 DIAGNOSIS — I69391 Dysphagia following cerebral infarction: Secondary | ICD-10-CM | POA: Diagnosis not present

## 2015-06-10 DIAGNOSIS — I10 Essential (primary) hypertension: Secondary | ICD-10-CM | POA: Diagnosis not present

## 2015-06-10 DIAGNOSIS — E1140 Type 2 diabetes mellitus with diabetic neuropathy, unspecified: Secondary | ICD-10-CM | POA: Diagnosis not present

## 2015-06-10 DIAGNOSIS — I5032 Chronic diastolic (congestive) heart failure: Secondary | ICD-10-CM | POA: Diagnosis not present

## 2015-06-14 ENCOUNTER — Ambulatory Visit: Payer: PRIVATE HEALTH INSURANCE | Admitting: Neurology

## 2015-06-15 ENCOUNTER — Encounter: Payer: Self-pay | Admitting: Neurology

## 2015-06-17 DIAGNOSIS — R531 Weakness: Secondary | ICD-10-CM | POA: Diagnosis not present

## 2015-06-17 DIAGNOSIS — I69322 Dysarthria following cerebral infarction: Secondary | ICD-10-CM | POA: Diagnosis not present

## 2015-06-17 DIAGNOSIS — E1140 Type 2 diabetes mellitus with diabetic neuropathy, unspecified: Secondary | ICD-10-CM | POA: Diagnosis not present

## 2015-06-17 DIAGNOSIS — I10 Essential (primary) hypertension: Secondary | ICD-10-CM | POA: Diagnosis not present

## 2015-06-17 DIAGNOSIS — I69391 Dysphagia following cerebral infarction: Secondary | ICD-10-CM | POA: Diagnosis not present

## 2015-06-17 DIAGNOSIS — I5032 Chronic diastolic (congestive) heart failure: Secondary | ICD-10-CM | POA: Diagnosis not present

## 2015-06-18 DIAGNOSIS — I10 Essential (primary) hypertension: Secondary | ICD-10-CM | POA: Diagnosis not present

## 2015-06-18 DIAGNOSIS — I5032 Chronic diastolic (congestive) heart failure: Secondary | ICD-10-CM | POA: Diagnosis not present

## 2015-06-18 DIAGNOSIS — R531 Weakness: Secondary | ICD-10-CM | POA: Diagnosis not present

## 2015-06-18 DIAGNOSIS — I69322 Dysarthria following cerebral infarction: Secondary | ICD-10-CM | POA: Diagnosis not present

## 2015-06-18 DIAGNOSIS — E1140 Type 2 diabetes mellitus with diabetic neuropathy, unspecified: Secondary | ICD-10-CM | POA: Diagnosis not present

## 2015-06-18 DIAGNOSIS — I69391 Dysphagia following cerebral infarction: Secondary | ICD-10-CM | POA: Diagnosis not present

## 2015-06-22 DIAGNOSIS — I69322 Dysarthria following cerebral infarction: Secondary | ICD-10-CM | POA: Diagnosis not present

## 2015-06-22 DIAGNOSIS — Z8546 Personal history of malignant neoplasm of prostate: Secondary | ICD-10-CM | POA: Diagnosis not present

## 2015-06-22 DIAGNOSIS — E1140 Type 2 diabetes mellitus with diabetic neuropathy, unspecified: Secondary | ICD-10-CM | POA: Diagnosis not present

## 2015-06-22 DIAGNOSIS — I4891 Unspecified atrial fibrillation: Secondary | ICD-10-CM | POA: Diagnosis not present

## 2015-06-22 DIAGNOSIS — Z794 Long term (current) use of insulin: Secondary | ICD-10-CM | POA: Diagnosis not present

## 2015-06-22 DIAGNOSIS — I10 Essential (primary) hypertension: Secondary | ICD-10-CM | POA: Diagnosis not present

## 2015-06-22 DIAGNOSIS — I69391 Dysphagia following cerebral infarction: Secondary | ICD-10-CM | POA: Diagnosis not present

## 2015-06-22 DIAGNOSIS — R1310 Dysphagia, unspecified: Secondary | ICD-10-CM | POA: Diagnosis not present

## 2015-06-22 DIAGNOSIS — G510 Bell's palsy: Secondary | ICD-10-CM | POA: Diagnosis not present

## 2015-06-22 DIAGNOSIS — I5032 Chronic diastolic (congestive) heart failure: Secondary | ICD-10-CM | POA: Diagnosis not present

## 2015-06-24 DIAGNOSIS — I69322 Dysarthria following cerebral infarction: Secondary | ICD-10-CM | POA: Diagnosis not present

## 2015-06-24 DIAGNOSIS — I5032 Chronic diastolic (congestive) heart failure: Secondary | ICD-10-CM | POA: Diagnosis not present

## 2015-06-24 DIAGNOSIS — I69391 Dysphagia following cerebral infarction: Secondary | ICD-10-CM | POA: Diagnosis not present

## 2015-06-24 DIAGNOSIS — G510 Bell's palsy: Secondary | ICD-10-CM | POA: Diagnosis not present

## 2015-06-24 DIAGNOSIS — R1310 Dysphagia, unspecified: Secondary | ICD-10-CM | POA: Diagnosis not present

## 2015-06-24 DIAGNOSIS — E1140 Type 2 diabetes mellitus with diabetic neuropathy, unspecified: Secondary | ICD-10-CM | POA: Diagnosis not present

## 2015-06-25 DIAGNOSIS — I5032 Chronic diastolic (congestive) heart failure: Secondary | ICD-10-CM | POA: Diagnosis not present

## 2015-06-25 DIAGNOSIS — I69322 Dysarthria following cerebral infarction: Secondary | ICD-10-CM | POA: Diagnosis not present

## 2015-06-25 DIAGNOSIS — I69391 Dysphagia following cerebral infarction: Secondary | ICD-10-CM | POA: Diagnosis not present

## 2015-06-25 DIAGNOSIS — G510 Bell's palsy: Secondary | ICD-10-CM | POA: Diagnosis not present

## 2015-06-25 DIAGNOSIS — R1310 Dysphagia, unspecified: Secondary | ICD-10-CM | POA: Diagnosis not present

## 2015-06-25 DIAGNOSIS — E1140 Type 2 diabetes mellitus with diabetic neuropathy, unspecified: Secondary | ICD-10-CM | POA: Diagnosis not present

## 2015-06-29 DIAGNOSIS — G510 Bell's palsy: Secondary | ICD-10-CM | POA: Diagnosis not present

## 2015-06-29 DIAGNOSIS — R1310 Dysphagia, unspecified: Secondary | ICD-10-CM | POA: Diagnosis not present

## 2015-06-29 DIAGNOSIS — I5032 Chronic diastolic (congestive) heart failure: Secondary | ICD-10-CM | POA: Diagnosis not present

## 2015-06-29 DIAGNOSIS — I69391 Dysphagia following cerebral infarction: Secondary | ICD-10-CM | POA: Diagnosis not present

## 2015-06-29 DIAGNOSIS — I69322 Dysarthria following cerebral infarction: Secondary | ICD-10-CM | POA: Diagnosis not present

## 2015-06-29 DIAGNOSIS — E1140 Type 2 diabetes mellitus with diabetic neuropathy, unspecified: Secondary | ICD-10-CM | POA: Diagnosis not present

## 2015-07-01 DIAGNOSIS — I69322 Dysarthria following cerebral infarction: Secondary | ICD-10-CM | POA: Diagnosis not present

## 2015-07-01 DIAGNOSIS — E1140 Type 2 diabetes mellitus with diabetic neuropathy, unspecified: Secondary | ICD-10-CM | POA: Diagnosis not present

## 2015-07-01 DIAGNOSIS — R1310 Dysphagia, unspecified: Secondary | ICD-10-CM | POA: Diagnosis not present

## 2015-07-01 DIAGNOSIS — G510 Bell's palsy: Secondary | ICD-10-CM | POA: Diagnosis not present

## 2015-07-01 DIAGNOSIS — I69391 Dysphagia following cerebral infarction: Secondary | ICD-10-CM | POA: Diagnosis not present

## 2015-07-01 DIAGNOSIS — I5032 Chronic diastolic (congestive) heart failure: Secondary | ICD-10-CM | POA: Diagnosis not present

## 2015-07-02 DIAGNOSIS — I69391 Dysphagia following cerebral infarction: Secondary | ICD-10-CM | POA: Diagnosis not present

## 2015-07-02 DIAGNOSIS — E1140 Type 2 diabetes mellitus with diabetic neuropathy, unspecified: Secondary | ICD-10-CM | POA: Diagnosis not present

## 2015-07-02 DIAGNOSIS — I5032 Chronic diastolic (congestive) heart failure: Secondary | ICD-10-CM | POA: Diagnosis not present

## 2015-07-02 DIAGNOSIS — G510 Bell's palsy: Secondary | ICD-10-CM | POA: Diagnosis not present

## 2015-07-02 DIAGNOSIS — I69322 Dysarthria following cerebral infarction: Secondary | ICD-10-CM | POA: Diagnosis not present

## 2015-07-02 DIAGNOSIS — R1310 Dysphagia, unspecified: Secondary | ICD-10-CM | POA: Diagnosis not present

## 2015-07-05 DIAGNOSIS — I5032 Chronic diastolic (congestive) heart failure: Secondary | ICD-10-CM | POA: Diagnosis not present

## 2015-07-05 DIAGNOSIS — G510 Bell's palsy: Secondary | ICD-10-CM | POA: Diagnosis not present

## 2015-07-05 DIAGNOSIS — R1310 Dysphagia, unspecified: Secondary | ICD-10-CM | POA: Diagnosis not present

## 2015-07-05 DIAGNOSIS — I69391 Dysphagia following cerebral infarction: Secondary | ICD-10-CM | POA: Diagnosis not present

## 2015-07-05 DIAGNOSIS — I69322 Dysarthria following cerebral infarction: Secondary | ICD-10-CM | POA: Diagnosis not present

## 2015-07-05 DIAGNOSIS — E1140 Type 2 diabetes mellitus with diabetic neuropathy, unspecified: Secondary | ICD-10-CM | POA: Diagnosis not present

## 2015-07-08 DIAGNOSIS — E1140 Type 2 diabetes mellitus with diabetic neuropathy, unspecified: Secondary | ICD-10-CM | POA: Diagnosis not present

## 2015-07-08 DIAGNOSIS — I69322 Dysarthria following cerebral infarction: Secondary | ICD-10-CM | POA: Diagnosis not present

## 2015-07-08 DIAGNOSIS — I5032 Chronic diastolic (congestive) heart failure: Secondary | ICD-10-CM | POA: Diagnosis not present

## 2015-07-08 DIAGNOSIS — R1310 Dysphagia, unspecified: Secondary | ICD-10-CM | POA: Diagnosis not present

## 2015-07-08 DIAGNOSIS — G510 Bell's palsy: Secondary | ICD-10-CM | POA: Diagnosis not present

## 2015-07-08 DIAGNOSIS — I69391 Dysphagia following cerebral infarction: Secondary | ICD-10-CM | POA: Diagnosis not present

## 2015-07-09 DIAGNOSIS — I69322 Dysarthria following cerebral infarction: Secondary | ICD-10-CM | POA: Diagnosis not present

## 2015-07-09 DIAGNOSIS — R1310 Dysphagia, unspecified: Secondary | ICD-10-CM | POA: Diagnosis not present

## 2015-07-09 DIAGNOSIS — I5032 Chronic diastolic (congestive) heart failure: Secondary | ICD-10-CM | POA: Diagnosis not present

## 2015-07-09 DIAGNOSIS — I69391 Dysphagia following cerebral infarction: Secondary | ICD-10-CM | POA: Diagnosis not present

## 2015-07-09 DIAGNOSIS — G510 Bell's palsy: Secondary | ICD-10-CM | POA: Diagnosis not present

## 2015-07-09 DIAGNOSIS — E1140 Type 2 diabetes mellitus with diabetic neuropathy, unspecified: Secondary | ICD-10-CM | POA: Diagnosis not present

## 2015-07-13 DIAGNOSIS — R1310 Dysphagia, unspecified: Secondary | ICD-10-CM | POA: Diagnosis not present

## 2015-07-13 DIAGNOSIS — I69322 Dysarthria following cerebral infarction: Secondary | ICD-10-CM | POA: Diagnosis not present

## 2015-07-13 DIAGNOSIS — E1140 Type 2 diabetes mellitus with diabetic neuropathy, unspecified: Secondary | ICD-10-CM | POA: Diagnosis not present

## 2015-07-13 DIAGNOSIS — I69391 Dysphagia following cerebral infarction: Secondary | ICD-10-CM | POA: Diagnosis not present

## 2015-07-13 DIAGNOSIS — I5032 Chronic diastolic (congestive) heart failure: Secondary | ICD-10-CM | POA: Diagnosis not present

## 2015-07-13 DIAGNOSIS — G510 Bell's palsy: Secondary | ICD-10-CM | POA: Diagnosis not present

## 2015-07-14 DIAGNOSIS — G510 Bell's palsy: Secondary | ICD-10-CM | POA: Diagnosis not present

## 2015-07-14 DIAGNOSIS — R1310 Dysphagia, unspecified: Secondary | ICD-10-CM | POA: Diagnosis not present

## 2015-07-14 DIAGNOSIS — I69322 Dysarthria following cerebral infarction: Secondary | ICD-10-CM | POA: Diagnosis not present

## 2015-07-14 DIAGNOSIS — I69391 Dysphagia following cerebral infarction: Secondary | ICD-10-CM | POA: Diagnosis not present

## 2015-07-14 DIAGNOSIS — E1140 Type 2 diabetes mellitus with diabetic neuropathy, unspecified: Secondary | ICD-10-CM | POA: Diagnosis not present

## 2015-07-14 DIAGNOSIS — I5032 Chronic diastolic (congestive) heart failure: Secondary | ICD-10-CM | POA: Diagnosis not present

## 2015-07-15 DIAGNOSIS — I69391 Dysphagia following cerebral infarction: Secondary | ICD-10-CM | POA: Diagnosis not present

## 2015-07-15 DIAGNOSIS — E1140 Type 2 diabetes mellitus with diabetic neuropathy, unspecified: Secondary | ICD-10-CM | POA: Diagnosis not present

## 2015-07-15 DIAGNOSIS — R1310 Dysphagia, unspecified: Secondary | ICD-10-CM | POA: Diagnosis not present

## 2015-07-15 DIAGNOSIS — I69322 Dysarthria following cerebral infarction: Secondary | ICD-10-CM | POA: Diagnosis not present

## 2015-07-15 DIAGNOSIS — I5032 Chronic diastolic (congestive) heart failure: Secondary | ICD-10-CM | POA: Diagnosis not present

## 2015-07-15 DIAGNOSIS — G510 Bell's palsy: Secondary | ICD-10-CM | POA: Diagnosis not present

## 2015-07-20 DIAGNOSIS — E1140 Type 2 diabetes mellitus with diabetic neuropathy, unspecified: Secondary | ICD-10-CM | POA: Diagnosis not present

## 2015-07-20 DIAGNOSIS — I5032 Chronic diastolic (congestive) heart failure: Secondary | ICD-10-CM | POA: Diagnosis not present

## 2015-07-20 DIAGNOSIS — I69391 Dysphagia following cerebral infarction: Secondary | ICD-10-CM | POA: Diagnosis not present

## 2015-07-20 DIAGNOSIS — G510 Bell's palsy: Secondary | ICD-10-CM | POA: Diagnosis not present

## 2015-07-20 DIAGNOSIS — R1310 Dysphagia, unspecified: Secondary | ICD-10-CM | POA: Diagnosis not present

## 2015-07-20 DIAGNOSIS — I69322 Dysarthria following cerebral infarction: Secondary | ICD-10-CM | POA: Diagnosis not present

## 2015-07-22 DIAGNOSIS — I5032 Chronic diastolic (congestive) heart failure: Secondary | ICD-10-CM | POA: Diagnosis not present

## 2015-07-22 DIAGNOSIS — G510 Bell's palsy: Secondary | ICD-10-CM | POA: Diagnosis not present

## 2015-07-22 DIAGNOSIS — I69391 Dysphagia following cerebral infarction: Secondary | ICD-10-CM | POA: Diagnosis not present

## 2015-07-22 DIAGNOSIS — I69322 Dysarthria following cerebral infarction: Secondary | ICD-10-CM | POA: Diagnosis not present

## 2015-07-22 DIAGNOSIS — R1310 Dysphagia, unspecified: Secondary | ICD-10-CM | POA: Diagnosis not present

## 2015-07-22 DIAGNOSIS — E1140 Type 2 diabetes mellitus with diabetic neuropathy, unspecified: Secondary | ICD-10-CM | POA: Diagnosis not present

## 2015-07-25 ENCOUNTER — Other Ambulatory Visit: Payer: Self-pay | Admitting: Adult Health

## 2015-07-26 DIAGNOSIS — R1310 Dysphagia, unspecified: Secondary | ICD-10-CM | POA: Diagnosis not present

## 2015-07-26 DIAGNOSIS — I69322 Dysarthria following cerebral infarction: Secondary | ICD-10-CM | POA: Diagnosis not present

## 2015-07-26 DIAGNOSIS — G510 Bell's palsy: Secondary | ICD-10-CM | POA: Diagnosis not present

## 2015-07-26 DIAGNOSIS — I69391 Dysphagia following cerebral infarction: Secondary | ICD-10-CM | POA: Diagnosis not present

## 2015-07-26 DIAGNOSIS — I5032 Chronic diastolic (congestive) heart failure: Secondary | ICD-10-CM | POA: Diagnosis not present

## 2015-07-26 DIAGNOSIS — E1140 Type 2 diabetes mellitus with diabetic neuropathy, unspecified: Secondary | ICD-10-CM | POA: Diagnosis not present

## 2015-07-29 DIAGNOSIS — R1310 Dysphagia, unspecified: Secondary | ICD-10-CM | POA: Diagnosis not present

## 2015-07-29 DIAGNOSIS — G510 Bell's palsy: Secondary | ICD-10-CM | POA: Diagnosis not present

## 2015-07-29 DIAGNOSIS — I69322 Dysarthria following cerebral infarction: Secondary | ICD-10-CM | POA: Diagnosis not present

## 2015-07-29 DIAGNOSIS — I69391 Dysphagia following cerebral infarction: Secondary | ICD-10-CM | POA: Diagnosis not present

## 2015-07-29 DIAGNOSIS — I5032 Chronic diastolic (congestive) heart failure: Secondary | ICD-10-CM | POA: Diagnosis not present

## 2015-07-29 DIAGNOSIS — E1140 Type 2 diabetes mellitus with diabetic neuropathy, unspecified: Secondary | ICD-10-CM | POA: Diagnosis not present

## 2015-07-30 ENCOUNTER — Other Ambulatory Visit: Payer: Self-pay | Admitting: Adult Health

## 2015-08-04 DIAGNOSIS — I5032 Chronic diastolic (congestive) heart failure: Secondary | ICD-10-CM | POA: Diagnosis not present

## 2015-08-04 DIAGNOSIS — E1140 Type 2 diabetes mellitus with diabetic neuropathy, unspecified: Secondary | ICD-10-CM | POA: Diagnosis not present

## 2015-08-04 DIAGNOSIS — I69322 Dysarthria following cerebral infarction: Secondary | ICD-10-CM | POA: Diagnosis not present

## 2015-08-04 DIAGNOSIS — I69391 Dysphagia following cerebral infarction: Secondary | ICD-10-CM | POA: Diagnosis not present

## 2015-08-04 DIAGNOSIS — R1310 Dysphagia, unspecified: Secondary | ICD-10-CM | POA: Diagnosis not present

## 2015-08-04 DIAGNOSIS — G510 Bell's palsy: Secondary | ICD-10-CM | POA: Diagnosis not present

## 2015-08-05 DIAGNOSIS — G510 Bell's palsy: Secondary | ICD-10-CM | POA: Diagnosis not present

## 2015-08-05 DIAGNOSIS — R1310 Dysphagia, unspecified: Secondary | ICD-10-CM | POA: Diagnosis not present

## 2015-08-05 DIAGNOSIS — I69322 Dysarthria following cerebral infarction: Secondary | ICD-10-CM | POA: Diagnosis not present

## 2015-08-05 DIAGNOSIS — I69391 Dysphagia following cerebral infarction: Secondary | ICD-10-CM | POA: Diagnosis not present

## 2015-08-05 DIAGNOSIS — E1140 Type 2 diabetes mellitus with diabetic neuropathy, unspecified: Secondary | ICD-10-CM | POA: Diagnosis not present

## 2015-08-05 DIAGNOSIS — I5032 Chronic diastolic (congestive) heart failure: Secondary | ICD-10-CM | POA: Diagnosis not present

## 2015-08-07 DIAGNOSIS — G510 Bell's palsy: Secondary | ICD-10-CM | POA: Diagnosis not present

## 2015-08-07 DIAGNOSIS — E1140 Type 2 diabetes mellitus with diabetic neuropathy, unspecified: Secondary | ICD-10-CM | POA: Diagnosis not present

## 2015-08-07 DIAGNOSIS — I69322 Dysarthria following cerebral infarction: Secondary | ICD-10-CM | POA: Diagnosis not present

## 2015-08-07 DIAGNOSIS — I5032 Chronic diastolic (congestive) heart failure: Secondary | ICD-10-CM | POA: Diagnosis not present

## 2015-08-07 DIAGNOSIS — I69391 Dysphagia following cerebral infarction: Secondary | ICD-10-CM | POA: Diagnosis not present

## 2015-08-07 DIAGNOSIS — R1310 Dysphagia, unspecified: Secondary | ICD-10-CM | POA: Diagnosis not present

## 2015-08-10 DIAGNOSIS — I69322 Dysarthria following cerebral infarction: Secondary | ICD-10-CM | POA: Diagnosis not present

## 2015-08-10 DIAGNOSIS — E1140 Type 2 diabetes mellitus with diabetic neuropathy, unspecified: Secondary | ICD-10-CM | POA: Diagnosis not present

## 2015-08-10 DIAGNOSIS — R1310 Dysphagia, unspecified: Secondary | ICD-10-CM | POA: Diagnosis not present

## 2015-08-10 DIAGNOSIS — I69391 Dysphagia following cerebral infarction: Secondary | ICD-10-CM | POA: Diagnosis not present

## 2015-08-10 DIAGNOSIS — G510 Bell's palsy: Secondary | ICD-10-CM | POA: Diagnosis not present

## 2015-08-10 DIAGNOSIS — I5032 Chronic diastolic (congestive) heart failure: Secondary | ICD-10-CM | POA: Diagnosis not present

## 2015-08-12 DIAGNOSIS — R1310 Dysphagia, unspecified: Secondary | ICD-10-CM | POA: Diagnosis not present

## 2015-08-12 DIAGNOSIS — I69322 Dysarthria following cerebral infarction: Secondary | ICD-10-CM | POA: Diagnosis not present

## 2015-08-12 DIAGNOSIS — I69391 Dysphagia following cerebral infarction: Secondary | ICD-10-CM | POA: Diagnosis not present

## 2015-08-12 DIAGNOSIS — I5032 Chronic diastolic (congestive) heart failure: Secondary | ICD-10-CM | POA: Diagnosis not present

## 2015-08-12 DIAGNOSIS — G510 Bell's palsy: Secondary | ICD-10-CM | POA: Diagnosis not present

## 2015-08-12 DIAGNOSIS — E1140 Type 2 diabetes mellitus with diabetic neuropathy, unspecified: Secondary | ICD-10-CM | POA: Diagnosis not present

## 2015-08-17 DIAGNOSIS — R1310 Dysphagia, unspecified: Secondary | ICD-10-CM | POA: Diagnosis not present

## 2015-08-17 DIAGNOSIS — I69391 Dysphagia following cerebral infarction: Secondary | ICD-10-CM | POA: Diagnosis not present

## 2015-08-17 DIAGNOSIS — I69322 Dysarthria following cerebral infarction: Secondary | ICD-10-CM | POA: Diagnosis not present

## 2015-08-17 DIAGNOSIS — I5032 Chronic diastolic (congestive) heart failure: Secondary | ICD-10-CM | POA: Diagnosis not present

## 2015-08-17 DIAGNOSIS — E1140 Type 2 diabetes mellitus with diabetic neuropathy, unspecified: Secondary | ICD-10-CM | POA: Diagnosis not present

## 2015-08-17 DIAGNOSIS — G510 Bell's palsy: Secondary | ICD-10-CM | POA: Diagnosis not present

## 2015-08-19 DIAGNOSIS — I69322 Dysarthria following cerebral infarction: Secondary | ICD-10-CM | POA: Diagnosis not present

## 2015-08-19 DIAGNOSIS — G510 Bell's palsy: Secondary | ICD-10-CM | POA: Diagnosis not present

## 2015-08-19 DIAGNOSIS — I5032 Chronic diastolic (congestive) heart failure: Secondary | ICD-10-CM | POA: Diagnosis not present

## 2015-08-19 DIAGNOSIS — R1310 Dysphagia, unspecified: Secondary | ICD-10-CM | POA: Diagnosis not present

## 2015-08-19 DIAGNOSIS — E1140 Type 2 diabetes mellitus with diabetic neuropathy, unspecified: Secondary | ICD-10-CM | POA: Diagnosis not present

## 2015-08-19 DIAGNOSIS — I69391 Dysphagia following cerebral infarction: Secondary | ICD-10-CM | POA: Diagnosis not present

## 2015-08-21 DIAGNOSIS — Z794 Long term (current) use of insulin: Secondary | ICD-10-CM | POA: Diagnosis not present

## 2015-08-21 DIAGNOSIS — I10 Essential (primary) hypertension: Secondary | ICD-10-CM | POA: Diagnosis not present

## 2015-08-21 DIAGNOSIS — G510 Bell's palsy: Secondary | ICD-10-CM | POA: Diagnosis not present

## 2015-08-21 DIAGNOSIS — I4891 Unspecified atrial fibrillation: Secondary | ICD-10-CM | POA: Diagnosis not present

## 2015-08-21 DIAGNOSIS — I69391 Dysphagia following cerebral infarction: Secondary | ICD-10-CM | POA: Diagnosis not present

## 2015-08-21 DIAGNOSIS — I69322 Dysarthria following cerebral infarction: Secondary | ICD-10-CM | POA: Diagnosis not present

## 2015-08-21 DIAGNOSIS — R1310 Dysphagia, unspecified: Secondary | ICD-10-CM | POA: Diagnosis not present

## 2015-08-21 DIAGNOSIS — L89312 Pressure ulcer of right buttock, stage 2: Secondary | ICD-10-CM | POA: Diagnosis not present

## 2015-08-21 DIAGNOSIS — Z8546 Personal history of malignant neoplasm of prostate: Secondary | ICD-10-CM | POA: Diagnosis not present

## 2015-08-21 DIAGNOSIS — S3091XD Unspecified superficial injury of lower back and pelvis, subsequent encounter: Secondary | ICD-10-CM | POA: Diagnosis not present

## 2015-08-21 DIAGNOSIS — E1140 Type 2 diabetes mellitus with diabetic neuropathy, unspecified: Secondary | ICD-10-CM | POA: Diagnosis not present

## 2015-08-21 DIAGNOSIS — I5032 Chronic diastolic (congestive) heart failure: Secondary | ICD-10-CM | POA: Diagnosis not present

## 2015-08-23 DIAGNOSIS — G510 Bell's palsy: Secondary | ICD-10-CM | POA: Diagnosis not present

## 2015-08-23 DIAGNOSIS — I69322 Dysarthria following cerebral infarction: Secondary | ICD-10-CM | POA: Diagnosis not present

## 2015-08-23 DIAGNOSIS — L89312 Pressure ulcer of right buttock, stage 2: Secondary | ICD-10-CM | POA: Diagnosis not present

## 2015-08-23 DIAGNOSIS — R1310 Dysphagia, unspecified: Secondary | ICD-10-CM | POA: Diagnosis not present

## 2015-08-23 DIAGNOSIS — S3091XD Unspecified superficial injury of lower back and pelvis, subsequent encounter: Secondary | ICD-10-CM | POA: Diagnosis not present

## 2015-08-23 DIAGNOSIS — I69391 Dysphagia following cerebral infarction: Secondary | ICD-10-CM | POA: Diagnosis not present

## 2015-08-26 DIAGNOSIS — G510 Bell's palsy: Secondary | ICD-10-CM | POA: Diagnosis not present

## 2015-08-26 DIAGNOSIS — I69391 Dysphagia following cerebral infarction: Secondary | ICD-10-CM | POA: Diagnosis not present

## 2015-08-26 DIAGNOSIS — L89312 Pressure ulcer of right buttock, stage 2: Secondary | ICD-10-CM | POA: Diagnosis not present

## 2015-08-26 DIAGNOSIS — S3091XD Unspecified superficial injury of lower back and pelvis, subsequent encounter: Secondary | ICD-10-CM | POA: Diagnosis not present

## 2015-08-26 DIAGNOSIS — I69322 Dysarthria following cerebral infarction: Secondary | ICD-10-CM | POA: Diagnosis not present

## 2015-08-26 DIAGNOSIS — R1310 Dysphagia, unspecified: Secondary | ICD-10-CM | POA: Diagnosis not present

## 2015-08-27 DIAGNOSIS — Z23 Encounter for immunization: Secondary | ICD-10-CM | POA: Diagnosis not present

## 2015-08-27 DIAGNOSIS — L0232 Furuncle of buttock: Secondary | ICD-10-CM | POA: Diagnosis not present

## 2015-08-27 DIAGNOSIS — M179 Osteoarthritis of knee, unspecified: Secondary | ICD-10-CM | POA: Diagnosis not present

## 2015-08-27 DIAGNOSIS — I8311 Varicose veins of right lower extremity with inflammation: Secondary | ICD-10-CM | POA: Diagnosis not present

## 2015-09-02 DIAGNOSIS — I69322 Dysarthria following cerebral infarction: Secondary | ICD-10-CM | POA: Diagnosis not present

## 2015-09-02 DIAGNOSIS — I69391 Dysphagia following cerebral infarction: Secondary | ICD-10-CM | POA: Diagnosis not present

## 2015-09-02 DIAGNOSIS — S3091XD Unspecified superficial injury of lower back and pelvis, subsequent encounter: Secondary | ICD-10-CM | POA: Diagnosis not present

## 2015-09-02 DIAGNOSIS — R1310 Dysphagia, unspecified: Secondary | ICD-10-CM | POA: Diagnosis not present

## 2015-09-02 DIAGNOSIS — G510 Bell's palsy: Secondary | ICD-10-CM | POA: Diagnosis not present

## 2015-09-02 DIAGNOSIS — L89312 Pressure ulcer of right buttock, stage 2: Secondary | ICD-10-CM | POA: Diagnosis not present

## 2015-09-03 DIAGNOSIS — I69322 Dysarthria following cerebral infarction: Secondary | ICD-10-CM | POA: Diagnosis not present

## 2015-09-03 DIAGNOSIS — L89312 Pressure ulcer of right buttock, stage 2: Secondary | ICD-10-CM | POA: Diagnosis not present

## 2015-09-03 DIAGNOSIS — R1310 Dysphagia, unspecified: Secondary | ICD-10-CM | POA: Diagnosis not present

## 2015-09-03 DIAGNOSIS — G510 Bell's palsy: Secondary | ICD-10-CM | POA: Diagnosis not present

## 2015-09-03 DIAGNOSIS — S3091XD Unspecified superficial injury of lower back and pelvis, subsequent encounter: Secondary | ICD-10-CM | POA: Diagnosis not present

## 2015-09-03 DIAGNOSIS — I69391 Dysphagia following cerebral infarction: Secondary | ICD-10-CM | POA: Diagnosis not present

## 2015-09-08 DIAGNOSIS — I69322 Dysarthria following cerebral infarction: Secondary | ICD-10-CM | POA: Diagnosis not present

## 2015-09-08 DIAGNOSIS — G510 Bell's palsy: Secondary | ICD-10-CM | POA: Diagnosis not present

## 2015-09-08 DIAGNOSIS — L89312 Pressure ulcer of right buttock, stage 2: Secondary | ICD-10-CM | POA: Diagnosis not present

## 2015-09-08 DIAGNOSIS — I69391 Dysphagia following cerebral infarction: Secondary | ICD-10-CM | POA: Diagnosis not present

## 2015-09-08 DIAGNOSIS — R1310 Dysphagia, unspecified: Secondary | ICD-10-CM | POA: Diagnosis not present

## 2015-09-08 DIAGNOSIS — S3091XD Unspecified superficial injury of lower back and pelvis, subsequent encounter: Secondary | ICD-10-CM | POA: Diagnosis not present

## 2015-09-09 DIAGNOSIS — I69322 Dysarthria following cerebral infarction: Secondary | ICD-10-CM | POA: Diagnosis not present

## 2015-09-09 DIAGNOSIS — R1310 Dysphagia, unspecified: Secondary | ICD-10-CM | POA: Diagnosis not present

## 2015-09-09 DIAGNOSIS — G510 Bell's palsy: Secondary | ICD-10-CM | POA: Diagnosis not present

## 2015-09-09 DIAGNOSIS — L89312 Pressure ulcer of right buttock, stage 2: Secondary | ICD-10-CM | POA: Diagnosis not present

## 2015-09-09 DIAGNOSIS — I69391 Dysphagia following cerebral infarction: Secondary | ICD-10-CM | POA: Diagnosis not present

## 2015-09-09 DIAGNOSIS — S3091XD Unspecified superficial injury of lower back and pelvis, subsequent encounter: Secondary | ICD-10-CM | POA: Diagnosis not present

## 2015-09-15 DIAGNOSIS — R1310 Dysphagia, unspecified: Secondary | ICD-10-CM | POA: Diagnosis not present

## 2015-09-15 DIAGNOSIS — S3091XD Unspecified superficial injury of lower back and pelvis, subsequent encounter: Secondary | ICD-10-CM | POA: Diagnosis not present

## 2015-09-15 DIAGNOSIS — G510 Bell's palsy: Secondary | ICD-10-CM | POA: Diagnosis not present

## 2015-09-15 DIAGNOSIS — I69322 Dysarthria following cerebral infarction: Secondary | ICD-10-CM | POA: Diagnosis not present

## 2015-09-15 DIAGNOSIS — L89312 Pressure ulcer of right buttock, stage 2: Secondary | ICD-10-CM | POA: Diagnosis not present

## 2015-09-15 DIAGNOSIS — I69391 Dysphagia following cerebral infarction: Secondary | ICD-10-CM | POA: Diagnosis not present

## 2015-09-17 DIAGNOSIS — S3091XD Unspecified superficial injury of lower back and pelvis, subsequent encounter: Secondary | ICD-10-CM | POA: Diagnosis not present

## 2015-09-17 DIAGNOSIS — L89312 Pressure ulcer of right buttock, stage 2: Secondary | ICD-10-CM | POA: Diagnosis not present

## 2015-09-17 DIAGNOSIS — G510 Bell's palsy: Secondary | ICD-10-CM | POA: Diagnosis not present

## 2015-09-17 DIAGNOSIS — I69391 Dysphagia following cerebral infarction: Secondary | ICD-10-CM | POA: Diagnosis not present

## 2015-09-17 DIAGNOSIS — I69322 Dysarthria following cerebral infarction: Secondary | ICD-10-CM | POA: Diagnosis not present

## 2015-09-17 DIAGNOSIS — R1310 Dysphagia, unspecified: Secondary | ICD-10-CM | POA: Diagnosis not present

## 2015-09-22 DIAGNOSIS — S3091XD Unspecified superficial injury of lower back and pelvis, subsequent encounter: Secondary | ICD-10-CM | POA: Diagnosis not present

## 2015-09-22 DIAGNOSIS — G510 Bell's palsy: Secondary | ICD-10-CM | POA: Diagnosis not present

## 2015-09-22 DIAGNOSIS — L89312 Pressure ulcer of right buttock, stage 2: Secondary | ICD-10-CM | POA: Diagnosis not present

## 2015-09-22 DIAGNOSIS — I69391 Dysphagia following cerebral infarction: Secondary | ICD-10-CM | POA: Diagnosis not present

## 2015-09-22 DIAGNOSIS — R1310 Dysphagia, unspecified: Secondary | ICD-10-CM | POA: Diagnosis not present

## 2015-09-22 DIAGNOSIS — I69322 Dysarthria following cerebral infarction: Secondary | ICD-10-CM | POA: Diagnosis not present

## 2015-09-23 DIAGNOSIS — S3091XD Unspecified superficial injury of lower back and pelvis, subsequent encounter: Secondary | ICD-10-CM | POA: Diagnosis not present

## 2015-09-23 DIAGNOSIS — L89312 Pressure ulcer of right buttock, stage 2: Secondary | ICD-10-CM | POA: Diagnosis not present

## 2015-09-23 DIAGNOSIS — R1310 Dysphagia, unspecified: Secondary | ICD-10-CM | POA: Diagnosis not present

## 2015-09-23 DIAGNOSIS — I69391 Dysphagia following cerebral infarction: Secondary | ICD-10-CM | POA: Diagnosis not present

## 2015-09-23 DIAGNOSIS — G510 Bell's palsy: Secondary | ICD-10-CM | POA: Diagnosis not present

## 2015-09-23 DIAGNOSIS — I69322 Dysarthria following cerebral infarction: Secondary | ICD-10-CM | POA: Diagnosis not present

## 2015-09-28 DIAGNOSIS — R1310 Dysphagia, unspecified: Secondary | ICD-10-CM | POA: Diagnosis not present

## 2015-09-28 DIAGNOSIS — S3091XD Unspecified superficial injury of lower back and pelvis, subsequent encounter: Secondary | ICD-10-CM | POA: Diagnosis not present

## 2015-09-28 DIAGNOSIS — I69391 Dysphagia following cerebral infarction: Secondary | ICD-10-CM | POA: Diagnosis not present

## 2015-09-28 DIAGNOSIS — G510 Bell's palsy: Secondary | ICD-10-CM | POA: Diagnosis not present

## 2015-09-28 DIAGNOSIS — I69322 Dysarthria following cerebral infarction: Secondary | ICD-10-CM | POA: Diagnosis not present

## 2015-09-28 DIAGNOSIS — L89312 Pressure ulcer of right buttock, stage 2: Secondary | ICD-10-CM | POA: Diagnosis not present

## 2015-09-29 DIAGNOSIS — I69322 Dysarthria following cerebral infarction: Secondary | ICD-10-CM | POA: Diagnosis not present

## 2015-09-29 DIAGNOSIS — S3091XD Unspecified superficial injury of lower back and pelvis, subsequent encounter: Secondary | ICD-10-CM | POA: Diagnosis not present

## 2015-09-29 DIAGNOSIS — G510 Bell's palsy: Secondary | ICD-10-CM | POA: Diagnosis not present

## 2015-09-29 DIAGNOSIS — R1310 Dysphagia, unspecified: Secondary | ICD-10-CM | POA: Diagnosis not present

## 2015-09-29 DIAGNOSIS — L89312 Pressure ulcer of right buttock, stage 2: Secondary | ICD-10-CM | POA: Diagnosis not present

## 2015-09-29 DIAGNOSIS — I69391 Dysphagia following cerebral infarction: Secondary | ICD-10-CM | POA: Diagnosis not present

## 2015-10-05 DIAGNOSIS — I69391 Dysphagia following cerebral infarction: Secondary | ICD-10-CM | POA: Diagnosis not present

## 2015-10-05 DIAGNOSIS — R1310 Dysphagia, unspecified: Secondary | ICD-10-CM | POA: Diagnosis not present

## 2015-10-05 DIAGNOSIS — L89312 Pressure ulcer of right buttock, stage 2: Secondary | ICD-10-CM | POA: Diagnosis not present

## 2015-10-05 DIAGNOSIS — G510 Bell's palsy: Secondary | ICD-10-CM | POA: Diagnosis not present

## 2015-10-05 DIAGNOSIS — I69322 Dysarthria following cerebral infarction: Secondary | ICD-10-CM | POA: Diagnosis not present

## 2015-10-05 DIAGNOSIS — S3091XD Unspecified superficial injury of lower back and pelvis, subsequent encounter: Secondary | ICD-10-CM | POA: Diagnosis not present

## 2015-10-07 DIAGNOSIS — G510 Bell's palsy: Secondary | ICD-10-CM | POA: Diagnosis not present

## 2015-10-07 DIAGNOSIS — R1310 Dysphagia, unspecified: Secondary | ICD-10-CM | POA: Diagnosis not present

## 2015-10-07 DIAGNOSIS — I69391 Dysphagia following cerebral infarction: Secondary | ICD-10-CM | POA: Diagnosis not present

## 2015-10-07 DIAGNOSIS — I69322 Dysarthria following cerebral infarction: Secondary | ICD-10-CM | POA: Diagnosis not present

## 2015-10-07 DIAGNOSIS — L89312 Pressure ulcer of right buttock, stage 2: Secondary | ICD-10-CM | POA: Diagnosis not present

## 2015-10-07 DIAGNOSIS — S3091XD Unspecified superficial injury of lower back and pelvis, subsequent encounter: Secondary | ICD-10-CM | POA: Diagnosis not present

## 2015-10-13 DIAGNOSIS — I69391 Dysphagia following cerebral infarction: Secondary | ICD-10-CM | POA: Diagnosis not present

## 2015-10-13 DIAGNOSIS — G510 Bell's palsy: Secondary | ICD-10-CM | POA: Diagnosis not present

## 2015-10-13 DIAGNOSIS — R1310 Dysphagia, unspecified: Secondary | ICD-10-CM | POA: Diagnosis not present

## 2015-10-13 DIAGNOSIS — L89312 Pressure ulcer of right buttock, stage 2: Secondary | ICD-10-CM | POA: Diagnosis not present

## 2015-10-13 DIAGNOSIS — I69322 Dysarthria following cerebral infarction: Secondary | ICD-10-CM | POA: Diagnosis not present

## 2015-10-13 DIAGNOSIS — S3091XD Unspecified superficial injury of lower back and pelvis, subsequent encounter: Secondary | ICD-10-CM | POA: Diagnosis not present

## 2015-10-14 DIAGNOSIS — I69322 Dysarthria following cerebral infarction: Secondary | ICD-10-CM | POA: Diagnosis not present

## 2015-10-14 DIAGNOSIS — S3091XD Unspecified superficial injury of lower back and pelvis, subsequent encounter: Secondary | ICD-10-CM | POA: Diagnosis not present

## 2015-10-14 DIAGNOSIS — L89312 Pressure ulcer of right buttock, stage 2: Secondary | ICD-10-CM | POA: Diagnosis not present

## 2015-10-14 DIAGNOSIS — G510 Bell's palsy: Secondary | ICD-10-CM | POA: Diagnosis not present

## 2015-10-14 DIAGNOSIS — I69391 Dysphagia following cerebral infarction: Secondary | ICD-10-CM | POA: Diagnosis not present

## 2015-10-14 DIAGNOSIS — R1310 Dysphagia, unspecified: Secondary | ICD-10-CM | POA: Diagnosis not present

## 2015-10-18 DIAGNOSIS — S3091XD Unspecified superficial injury of lower back and pelvis, subsequent encounter: Secondary | ICD-10-CM | POA: Diagnosis not present

## 2015-10-18 DIAGNOSIS — I69391 Dysphagia following cerebral infarction: Secondary | ICD-10-CM | POA: Diagnosis not present

## 2015-10-18 DIAGNOSIS — G510 Bell's palsy: Secondary | ICD-10-CM | POA: Diagnosis not present

## 2015-10-18 DIAGNOSIS — I69322 Dysarthria following cerebral infarction: Secondary | ICD-10-CM | POA: Diagnosis not present

## 2015-10-18 DIAGNOSIS — L89312 Pressure ulcer of right buttock, stage 2: Secondary | ICD-10-CM | POA: Diagnosis not present

## 2015-10-18 DIAGNOSIS — R1310 Dysphagia, unspecified: Secondary | ICD-10-CM | POA: Diagnosis not present

## 2015-10-20 DIAGNOSIS — Z8546 Personal history of malignant neoplasm of prostate: Secondary | ICD-10-CM | POA: Diagnosis not present

## 2015-10-20 DIAGNOSIS — I8311 Varicose veins of right lower extremity with inflammation: Secondary | ICD-10-CM | POA: Diagnosis not present

## 2015-10-20 DIAGNOSIS — I4891 Unspecified atrial fibrillation: Secondary | ICD-10-CM | POA: Diagnosis not present

## 2015-10-20 DIAGNOSIS — I69322 Dysarthria following cerebral infarction: Secondary | ICD-10-CM | POA: Diagnosis not present

## 2015-10-20 DIAGNOSIS — I5032 Chronic diastolic (congestive) heart failure: Secondary | ICD-10-CM | POA: Diagnosis not present

## 2015-10-20 DIAGNOSIS — I8312 Varicose veins of left lower extremity with inflammation: Secondary | ICD-10-CM | POA: Diagnosis not present

## 2015-10-20 DIAGNOSIS — I10 Essential (primary) hypertension: Secondary | ICD-10-CM | POA: Diagnosis not present

## 2015-10-20 DIAGNOSIS — G510 Bell's palsy: Secondary | ICD-10-CM | POA: Diagnosis not present

## 2015-10-20 DIAGNOSIS — L89312 Pressure ulcer of right buttock, stage 2: Secondary | ICD-10-CM | POA: Diagnosis not present

## 2015-10-20 DIAGNOSIS — E1140 Type 2 diabetes mellitus with diabetic neuropathy, unspecified: Secondary | ICD-10-CM | POA: Diagnosis not present

## 2015-10-20 DIAGNOSIS — S3091XD Unspecified superficial injury of lower back and pelvis, subsequent encounter: Secondary | ICD-10-CM | POA: Diagnosis not present

## 2015-10-20 DIAGNOSIS — Z794 Long term (current) use of insulin: Secondary | ICD-10-CM | POA: Diagnosis not present

## 2015-10-20 DIAGNOSIS — R1310 Dysphagia, unspecified: Secondary | ICD-10-CM | POA: Diagnosis not present

## 2015-10-20 DIAGNOSIS — L0232 Furuncle of buttock: Secondary | ICD-10-CM | POA: Diagnosis not present

## 2015-10-20 DIAGNOSIS — I69391 Dysphagia following cerebral infarction: Secondary | ICD-10-CM | POA: Diagnosis not present

## 2015-10-21 DIAGNOSIS — I8312 Varicose veins of left lower extremity with inflammation: Secondary | ICD-10-CM | POA: Diagnosis not present

## 2015-10-21 DIAGNOSIS — E1140 Type 2 diabetes mellitus with diabetic neuropathy, unspecified: Secondary | ICD-10-CM | POA: Diagnosis not present

## 2015-10-21 DIAGNOSIS — L0232 Furuncle of buttock: Secondary | ICD-10-CM | POA: Diagnosis not present

## 2015-10-21 DIAGNOSIS — I10 Essential (primary) hypertension: Secondary | ICD-10-CM | POA: Diagnosis not present

## 2015-10-21 DIAGNOSIS — I8311 Varicose veins of right lower extremity with inflammation: Secondary | ICD-10-CM | POA: Diagnosis not present

## 2015-10-21 DIAGNOSIS — I5032 Chronic diastolic (congestive) heart failure: Secondary | ICD-10-CM | POA: Diagnosis not present

## 2015-10-26 ENCOUNTER — Other Ambulatory Visit: Payer: Self-pay | Admitting: Adult Health

## 2015-10-26 DIAGNOSIS — I10 Essential (primary) hypertension: Secondary | ICD-10-CM | POA: Diagnosis not present

## 2015-10-26 DIAGNOSIS — E1140 Type 2 diabetes mellitus with diabetic neuropathy, unspecified: Secondary | ICD-10-CM | POA: Diagnosis not present

## 2015-10-26 DIAGNOSIS — I8312 Varicose veins of left lower extremity with inflammation: Secondary | ICD-10-CM | POA: Diagnosis not present

## 2015-10-26 DIAGNOSIS — I5032 Chronic diastolic (congestive) heart failure: Secondary | ICD-10-CM | POA: Diagnosis not present

## 2015-10-26 DIAGNOSIS — I8311 Varicose veins of right lower extremity with inflammation: Secondary | ICD-10-CM | POA: Diagnosis not present

## 2015-10-26 DIAGNOSIS — L0232 Furuncle of buttock: Secondary | ICD-10-CM | POA: Diagnosis not present

## 2015-10-28 DIAGNOSIS — L0232 Furuncle of buttock: Secondary | ICD-10-CM | POA: Diagnosis not present

## 2015-10-28 DIAGNOSIS — E1140 Type 2 diabetes mellitus with diabetic neuropathy, unspecified: Secondary | ICD-10-CM | POA: Diagnosis not present

## 2015-10-28 DIAGNOSIS — I10 Essential (primary) hypertension: Secondary | ICD-10-CM | POA: Diagnosis not present

## 2015-10-28 DIAGNOSIS — I5032 Chronic diastolic (congestive) heart failure: Secondary | ICD-10-CM | POA: Diagnosis not present

## 2015-10-28 DIAGNOSIS — I8312 Varicose veins of left lower extremity with inflammation: Secondary | ICD-10-CM | POA: Diagnosis not present

## 2015-10-28 DIAGNOSIS — I8311 Varicose veins of right lower extremity with inflammation: Secondary | ICD-10-CM | POA: Diagnosis not present

## 2015-10-29 DIAGNOSIS — E559 Vitamin D deficiency, unspecified: Secondary | ICD-10-CM | POA: Diagnosis not present

## 2015-10-29 DIAGNOSIS — E0840 Diabetes mellitus due to underlying condition with diabetic neuropathy, unspecified: Secondary | ICD-10-CM | POA: Diagnosis not present

## 2015-10-29 DIAGNOSIS — I6789 Other cerebrovascular disease: Secondary | ICD-10-CM | POA: Diagnosis not present

## 2015-10-29 DIAGNOSIS — L0232 Furuncle of buttock: Secondary | ICD-10-CM | POA: Diagnosis not present

## 2015-10-29 DIAGNOSIS — M179 Osteoarthritis of knee, unspecified: Secondary | ICD-10-CM | POA: Diagnosis not present

## 2015-10-29 DIAGNOSIS — I10 Essential (primary) hypertension: Secondary | ICD-10-CM | POA: Diagnosis not present

## 2015-10-29 DIAGNOSIS — I672 Cerebral atherosclerosis: Secondary | ICD-10-CM | POA: Diagnosis not present

## 2015-10-29 DIAGNOSIS — E1165 Type 2 diabetes mellitus with hyperglycemia: Secondary | ICD-10-CM | POA: Diagnosis not present

## 2015-10-29 DIAGNOSIS — Z794 Long term (current) use of insulin: Secondary | ICD-10-CM | POA: Diagnosis not present

## 2015-10-29 DIAGNOSIS — Z1389 Encounter for screening for other disorder: Secondary | ICD-10-CM | POA: Diagnosis not present

## 2015-10-29 DIAGNOSIS — I8311 Varicose veins of right lower extremity with inflammation: Secondary | ICD-10-CM | POA: Diagnosis not present

## 2015-11-02 DIAGNOSIS — I5032 Chronic diastolic (congestive) heart failure: Secondary | ICD-10-CM | POA: Diagnosis not present

## 2015-11-02 DIAGNOSIS — I8312 Varicose veins of left lower extremity with inflammation: Secondary | ICD-10-CM | POA: Diagnosis not present

## 2015-11-02 DIAGNOSIS — I8311 Varicose veins of right lower extremity with inflammation: Secondary | ICD-10-CM | POA: Diagnosis not present

## 2015-11-02 DIAGNOSIS — L0232 Furuncle of buttock: Secondary | ICD-10-CM | POA: Diagnosis not present

## 2015-11-02 DIAGNOSIS — E1140 Type 2 diabetes mellitus with diabetic neuropathy, unspecified: Secondary | ICD-10-CM | POA: Diagnosis not present

## 2015-11-02 DIAGNOSIS — I10 Essential (primary) hypertension: Secondary | ICD-10-CM | POA: Diagnosis not present

## 2015-11-03 DIAGNOSIS — I10 Essential (primary) hypertension: Secondary | ICD-10-CM | POA: Diagnosis not present

## 2015-11-03 DIAGNOSIS — L0232 Furuncle of buttock: Secondary | ICD-10-CM | POA: Diagnosis not present

## 2015-11-03 DIAGNOSIS — E1140 Type 2 diabetes mellitus with diabetic neuropathy, unspecified: Secondary | ICD-10-CM | POA: Diagnosis not present

## 2015-11-03 DIAGNOSIS — R51 Headache: Secondary | ICD-10-CM | POA: Diagnosis not present

## 2015-11-03 DIAGNOSIS — I8312 Varicose veins of left lower extremity with inflammation: Secondary | ICD-10-CM | POA: Diagnosis not present

## 2015-11-03 DIAGNOSIS — I8311 Varicose veins of right lower extremity with inflammation: Secondary | ICD-10-CM | POA: Diagnosis not present

## 2015-11-03 DIAGNOSIS — I5032 Chronic diastolic (congestive) heart failure: Secondary | ICD-10-CM | POA: Diagnosis not present

## 2015-11-05 DIAGNOSIS — I5032 Chronic diastolic (congestive) heart failure: Secondary | ICD-10-CM | POA: Diagnosis not present

## 2015-11-05 DIAGNOSIS — I509 Heart failure, unspecified: Secondary | ICD-10-CM | POA: Diagnosis not present

## 2015-11-05 DIAGNOSIS — L0232 Furuncle of buttock: Secondary | ICD-10-CM | POA: Diagnosis not present

## 2015-11-05 DIAGNOSIS — E1140 Type 2 diabetes mellitus with diabetic neuropathy, unspecified: Secondary | ICD-10-CM | POA: Diagnosis not present

## 2015-11-05 DIAGNOSIS — E1149 Type 2 diabetes mellitus with other diabetic neurological complication: Secondary | ICD-10-CM | POA: Diagnosis not present

## 2015-11-05 DIAGNOSIS — B351 Tinea unguium: Secondary | ICD-10-CM | POA: Diagnosis not present

## 2015-11-05 DIAGNOSIS — Z794 Long term (current) use of insulin: Secondary | ICD-10-CM | POA: Diagnosis not present

## 2015-11-05 DIAGNOSIS — I10 Essential (primary) hypertension: Secondary | ICD-10-CM | POA: Diagnosis not present

## 2015-11-05 DIAGNOSIS — I8312 Varicose veins of left lower extremity with inflammation: Secondary | ICD-10-CM | POA: Diagnosis not present

## 2015-11-05 DIAGNOSIS — I8311 Varicose veins of right lower extremity with inflammation: Secondary | ICD-10-CM | POA: Diagnosis not present

## 2015-11-09 DIAGNOSIS — I10 Essential (primary) hypertension: Secondary | ICD-10-CM | POA: Diagnosis not present

## 2015-11-09 DIAGNOSIS — E1140 Type 2 diabetes mellitus with diabetic neuropathy, unspecified: Secondary | ICD-10-CM | POA: Diagnosis not present

## 2015-11-09 DIAGNOSIS — L0232 Furuncle of buttock: Secondary | ICD-10-CM | POA: Diagnosis not present

## 2015-11-09 DIAGNOSIS — I5032 Chronic diastolic (congestive) heart failure: Secondary | ICD-10-CM | POA: Diagnosis not present

## 2015-11-09 DIAGNOSIS — I8311 Varicose veins of right lower extremity with inflammation: Secondary | ICD-10-CM | POA: Diagnosis not present

## 2015-11-09 DIAGNOSIS — I8312 Varicose veins of left lower extremity with inflammation: Secondary | ICD-10-CM | POA: Diagnosis not present

## 2015-11-11 DIAGNOSIS — I8312 Varicose veins of left lower extremity with inflammation: Secondary | ICD-10-CM | POA: Diagnosis not present

## 2015-11-11 DIAGNOSIS — L0232 Furuncle of buttock: Secondary | ICD-10-CM | POA: Diagnosis not present

## 2015-11-11 DIAGNOSIS — I10 Essential (primary) hypertension: Secondary | ICD-10-CM | POA: Diagnosis not present

## 2015-11-11 DIAGNOSIS — I8311 Varicose veins of right lower extremity with inflammation: Secondary | ICD-10-CM | POA: Diagnosis not present

## 2015-11-11 DIAGNOSIS — E1140 Type 2 diabetes mellitus with diabetic neuropathy, unspecified: Secondary | ICD-10-CM | POA: Diagnosis not present

## 2015-11-11 DIAGNOSIS — I5032 Chronic diastolic (congestive) heart failure: Secondary | ICD-10-CM | POA: Diagnosis not present

## 2015-11-16 DIAGNOSIS — L0232 Furuncle of buttock: Secondary | ICD-10-CM | POA: Diagnosis not present

## 2015-11-16 DIAGNOSIS — I5032 Chronic diastolic (congestive) heart failure: Secondary | ICD-10-CM | POA: Diagnosis not present

## 2015-11-16 DIAGNOSIS — E1140 Type 2 diabetes mellitus with diabetic neuropathy, unspecified: Secondary | ICD-10-CM | POA: Diagnosis not present

## 2015-11-16 DIAGNOSIS — I8312 Varicose veins of left lower extremity with inflammation: Secondary | ICD-10-CM | POA: Diagnosis not present

## 2015-11-16 DIAGNOSIS — I10 Essential (primary) hypertension: Secondary | ICD-10-CM | POA: Diagnosis not present

## 2015-11-16 DIAGNOSIS — I8311 Varicose veins of right lower extremity with inflammation: Secondary | ICD-10-CM | POA: Diagnosis not present

## 2015-11-18 DIAGNOSIS — I5032 Chronic diastolic (congestive) heart failure: Secondary | ICD-10-CM | POA: Diagnosis not present

## 2015-11-18 DIAGNOSIS — I10 Essential (primary) hypertension: Secondary | ICD-10-CM | POA: Diagnosis not present

## 2015-11-18 DIAGNOSIS — E1140 Type 2 diabetes mellitus with diabetic neuropathy, unspecified: Secondary | ICD-10-CM | POA: Diagnosis not present

## 2015-11-18 DIAGNOSIS — L0232 Furuncle of buttock: Secondary | ICD-10-CM | POA: Diagnosis not present

## 2015-11-18 DIAGNOSIS — I8311 Varicose veins of right lower extremity with inflammation: Secondary | ICD-10-CM | POA: Diagnosis not present

## 2015-11-18 DIAGNOSIS — I8312 Varicose veins of left lower extremity with inflammation: Secondary | ICD-10-CM | POA: Diagnosis not present

## 2015-11-24 DIAGNOSIS — I10 Essential (primary) hypertension: Secondary | ICD-10-CM | POA: Diagnosis not present

## 2015-11-24 DIAGNOSIS — I5032 Chronic diastolic (congestive) heart failure: Secondary | ICD-10-CM | POA: Diagnosis not present

## 2015-11-24 DIAGNOSIS — E1140 Type 2 diabetes mellitus with diabetic neuropathy, unspecified: Secondary | ICD-10-CM | POA: Diagnosis not present

## 2015-11-24 DIAGNOSIS — I8312 Varicose veins of left lower extremity with inflammation: Secondary | ICD-10-CM | POA: Diagnosis not present

## 2015-11-24 DIAGNOSIS — I8311 Varicose veins of right lower extremity with inflammation: Secondary | ICD-10-CM | POA: Diagnosis not present

## 2015-11-24 DIAGNOSIS — L0232 Furuncle of buttock: Secondary | ICD-10-CM | POA: Diagnosis not present

## 2015-12-02 DIAGNOSIS — I10 Essential (primary) hypertension: Secondary | ICD-10-CM | POA: Diagnosis not present

## 2015-12-02 DIAGNOSIS — E1140 Type 2 diabetes mellitus with diabetic neuropathy, unspecified: Secondary | ICD-10-CM | POA: Diagnosis not present

## 2015-12-02 DIAGNOSIS — L0232 Furuncle of buttock: Secondary | ICD-10-CM | POA: Diagnosis not present

## 2015-12-02 DIAGNOSIS — I8312 Varicose veins of left lower extremity with inflammation: Secondary | ICD-10-CM | POA: Diagnosis not present

## 2015-12-02 DIAGNOSIS — I8311 Varicose veins of right lower extremity with inflammation: Secondary | ICD-10-CM | POA: Diagnosis not present

## 2015-12-02 DIAGNOSIS — I5032 Chronic diastolic (congestive) heart failure: Secondary | ICD-10-CM | POA: Diagnosis not present

## 2015-12-03 DIAGNOSIS — I10 Essential (primary) hypertension: Secondary | ICD-10-CM | POA: Diagnosis not present

## 2015-12-03 DIAGNOSIS — I8312 Varicose veins of left lower extremity with inflammation: Secondary | ICD-10-CM | POA: Diagnosis not present

## 2015-12-03 DIAGNOSIS — E1140 Type 2 diabetes mellitus with diabetic neuropathy, unspecified: Secondary | ICD-10-CM | POA: Diagnosis not present

## 2015-12-03 DIAGNOSIS — L0232 Furuncle of buttock: Secondary | ICD-10-CM | POA: Diagnosis not present

## 2015-12-03 DIAGNOSIS — I8311 Varicose veins of right lower extremity with inflammation: Secondary | ICD-10-CM | POA: Diagnosis not present

## 2015-12-03 DIAGNOSIS — I5032 Chronic diastolic (congestive) heart failure: Secondary | ICD-10-CM | POA: Diagnosis not present

## 2015-12-08 DIAGNOSIS — I5032 Chronic diastolic (congestive) heart failure: Secondary | ICD-10-CM | POA: Diagnosis not present

## 2015-12-08 DIAGNOSIS — I10 Essential (primary) hypertension: Secondary | ICD-10-CM | POA: Diagnosis not present

## 2015-12-08 DIAGNOSIS — E1140 Type 2 diabetes mellitus with diabetic neuropathy, unspecified: Secondary | ICD-10-CM | POA: Diagnosis not present

## 2015-12-08 DIAGNOSIS — L0232 Furuncle of buttock: Secondary | ICD-10-CM | POA: Diagnosis not present

## 2015-12-08 DIAGNOSIS — I8311 Varicose veins of right lower extremity with inflammation: Secondary | ICD-10-CM | POA: Diagnosis not present

## 2015-12-08 DIAGNOSIS — I8312 Varicose veins of left lower extremity with inflammation: Secondary | ICD-10-CM | POA: Diagnosis not present

## 2015-12-09 DIAGNOSIS — I8312 Varicose veins of left lower extremity with inflammation: Secondary | ICD-10-CM | POA: Diagnosis not present

## 2015-12-09 DIAGNOSIS — I10 Essential (primary) hypertension: Secondary | ICD-10-CM | POA: Diagnosis not present

## 2015-12-09 DIAGNOSIS — I5032 Chronic diastolic (congestive) heart failure: Secondary | ICD-10-CM | POA: Diagnosis not present

## 2015-12-09 DIAGNOSIS — L0232 Furuncle of buttock: Secondary | ICD-10-CM | POA: Diagnosis not present

## 2015-12-09 DIAGNOSIS — I8311 Varicose veins of right lower extremity with inflammation: Secondary | ICD-10-CM | POA: Diagnosis not present

## 2015-12-09 DIAGNOSIS — E1140 Type 2 diabetes mellitus with diabetic neuropathy, unspecified: Secondary | ICD-10-CM | POA: Diagnosis not present

## 2015-12-11 DIAGNOSIS — I480 Paroxysmal atrial fibrillation: Secondary | ICD-10-CM | POA: Diagnosis not present

## 2015-12-11 DIAGNOSIS — I10 Essential (primary) hypertension: Secondary | ICD-10-CM | POA: Diagnosis not present

## 2015-12-11 DIAGNOSIS — Z8673 Personal history of transient ischemic attack (TIA), and cerebral infarction without residual deficits: Secondary | ICD-10-CM | POA: Diagnosis not present

## 2015-12-15 DIAGNOSIS — I5032 Chronic diastolic (congestive) heart failure: Secondary | ICD-10-CM | POA: Diagnosis not present

## 2015-12-15 DIAGNOSIS — I10 Essential (primary) hypertension: Secondary | ICD-10-CM | POA: Diagnosis not present

## 2015-12-15 DIAGNOSIS — E1140 Type 2 diabetes mellitus with diabetic neuropathy, unspecified: Secondary | ICD-10-CM | POA: Diagnosis not present

## 2015-12-15 DIAGNOSIS — I8311 Varicose veins of right lower extremity with inflammation: Secondary | ICD-10-CM | POA: Diagnosis not present

## 2015-12-15 DIAGNOSIS — I8312 Varicose veins of left lower extremity with inflammation: Secondary | ICD-10-CM | POA: Diagnosis not present

## 2015-12-15 DIAGNOSIS — L0232 Furuncle of buttock: Secondary | ICD-10-CM | POA: Diagnosis not present

## 2015-12-19 DIAGNOSIS — I8312 Varicose veins of left lower extremity with inflammation: Secondary | ICD-10-CM | POA: Diagnosis not present

## 2015-12-19 DIAGNOSIS — I4891 Unspecified atrial fibrillation: Secondary | ICD-10-CM | POA: Diagnosis not present

## 2015-12-19 DIAGNOSIS — M179 Osteoarthritis of knee, unspecified: Secondary | ICD-10-CM | POA: Diagnosis not present

## 2015-12-19 DIAGNOSIS — Z794 Long term (current) use of insulin: Secondary | ICD-10-CM | POA: Diagnosis not present

## 2015-12-19 DIAGNOSIS — E1140 Type 2 diabetes mellitus with diabetic neuropathy, unspecified: Secondary | ICD-10-CM | POA: Diagnosis not present

## 2015-12-19 DIAGNOSIS — R32 Unspecified urinary incontinence: Secondary | ICD-10-CM | POA: Diagnosis not present

## 2015-12-19 DIAGNOSIS — I110 Hypertensive heart disease with heart failure: Secondary | ICD-10-CM | POA: Diagnosis not present

## 2015-12-19 DIAGNOSIS — I5032 Chronic diastolic (congestive) heart failure: Secondary | ICD-10-CM | POA: Diagnosis not present

## 2015-12-19 DIAGNOSIS — R1310 Dysphagia, unspecified: Secondary | ICD-10-CM | POA: Diagnosis not present

## 2015-12-19 DIAGNOSIS — I8311 Varicose veins of right lower extremity with inflammation: Secondary | ICD-10-CM | POA: Diagnosis not present

## 2015-12-19 DIAGNOSIS — I69391 Dysphagia following cerebral infarction: Secondary | ICD-10-CM | POA: Diagnosis not present

## 2015-12-19 DIAGNOSIS — Z8546 Personal history of malignant neoplasm of prostate: Secondary | ICD-10-CM | POA: Diagnosis not present

## 2015-12-19 DIAGNOSIS — I69322 Dysarthria following cerebral infarction: Secondary | ICD-10-CM | POA: Diagnosis not present

## 2015-12-19 DIAGNOSIS — B351 Tinea unguium: Secondary | ICD-10-CM | POA: Diagnosis not present

## 2015-12-19 DIAGNOSIS — G510 Bell's palsy: Secondary | ICD-10-CM | POA: Diagnosis not present

## 2015-12-23 DIAGNOSIS — E1140 Type 2 diabetes mellitus with diabetic neuropathy, unspecified: Secondary | ICD-10-CM | POA: Diagnosis not present

## 2015-12-23 DIAGNOSIS — I110 Hypertensive heart disease with heart failure: Secondary | ICD-10-CM | POA: Diagnosis not present

## 2015-12-23 DIAGNOSIS — I8311 Varicose veins of right lower extremity with inflammation: Secondary | ICD-10-CM | POA: Diagnosis not present

## 2015-12-23 DIAGNOSIS — I8312 Varicose veins of left lower extremity with inflammation: Secondary | ICD-10-CM | POA: Diagnosis not present

## 2015-12-23 DIAGNOSIS — I5032 Chronic diastolic (congestive) heart failure: Secondary | ICD-10-CM | POA: Diagnosis not present

## 2015-12-23 DIAGNOSIS — I69391 Dysphagia following cerebral infarction: Secondary | ICD-10-CM | POA: Diagnosis not present

## 2015-12-29 DIAGNOSIS — I5032 Chronic diastolic (congestive) heart failure: Secondary | ICD-10-CM | POA: Diagnosis not present

## 2015-12-29 DIAGNOSIS — I8312 Varicose veins of left lower extremity with inflammation: Secondary | ICD-10-CM | POA: Diagnosis not present

## 2015-12-29 DIAGNOSIS — I110 Hypertensive heart disease with heart failure: Secondary | ICD-10-CM | POA: Diagnosis not present

## 2015-12-29 DIAGNOSIS — I8311 Varicose veins of right lower extremity with inflammation: Secondary | ICD-10-CM | POA: Diagnosis not present

## 2015-12-29 DIAGNOSIS — E1140 Type 2 diabetes mellitus with diabetic neuropathy, unspecified: Secondary | ICD-10-CM | POA: Diagnosis not present

## 2015-12-29 DIAGNOSIS — I69391 Dysphagia following cerebral infarction: Secondary | ICD-10-CM | POA: Diagnosis not present

## 2016-01-06 DIAGNOSIS — I110 Hypertensive heart disease with heart failure: Secondary | ICD-10-CM | POA: Diagnosis not present

## 2016-01-06 DIAGNOSIS — I69391 Dysphagia following cerebral infarction: Secondary | ICD-10-CM | POA: Diagnosis not present

## 2016-01-06 DIAGNOSIS — I5032 Chronic diastolic (congestive) heart failure: Secondary | ICD-10-CM | POA: Diagnosis not present

## 2016-01-06 DIAGNOSIS — I8312 Varicose veins of left lower extremity with inflammation: Secondary | ICD-10-CM | POA: Diagnosis not present

## 2016-01-06 DIAGNOSIS — I8311 Varicose veins of right lower extremity with inflammation: Secondary | ICD-10-CM | POA: Diagnosis not present

## 2016-01-06 DIAGNOSIS — E1140 Type 2 diabetes mellitus with diabetic neuropathy, unspecified: Secondary | ICD-10-CM | POA: Diagnosis not present

## 2016-01-10 DIAGNOSIS — I69391 Dysphagia following cerebral infarction: Secondary | ICD-10-CM | POA: Diagnosis not present

## 2016-01-10 DIAGNOSIS — I5032 Chronic diastolic (congestive) heart failure: Secondary | ICD-10-CM | POA: Diagnosis not present

## 2016-01-10 DIAGNOSIS — I110 Hypertensive heart disease with heart failure: Secondary | ICD-10-CM | POA: Diagnosis not present

## 2016-01-10 DIAGNOSIS — E1140 Type 2 diabetes mellitus with diabetic neuropathy, unspecified: Secondary | ICD-10-CM | POA: Diagnosis not present

## 2016-01-10 DIAGNOSIS — I8312 Varicose veins of left lower extremity with inflammation: Secondary | ICD-10-CM | POA: Diagnosis not present

## 2016-01-10 DIAGNOSIS — I8311 Varicose veins of right lower extremity with inflammation: Secondary | ICD-10-CM | POA: Diagnosis not present

## 2016-01-13 DIAGNOSIS — I69391 Dysphagia following cerebral infarction: Secondary | ICD-10-CM | POA: Diagnosis not present

## 2016-01-13 DIAGNOSIS — I8311 Varicose veins of right lower extremity with inflammation: Secondary | ICD-10-CM | POA: Diagnosis not present

## 2016-01-13 DIAGNOSIS — E1140 Type 2 diabetes mellitus with diabetic neuropathy, unspecified: Secondary | ICD-10-CM | POA: Diagnosis not present

## 2016-01-13 DIAGNOSIS — I5032 Chronic diastolic (congestive) heart failure: Secondary | ICD-10-CM | POA: Diagnosis not present

## 2016-01-13 DIAGNOSIS — I8312 Varicose veins of left lower extremity with inflammation: Secondary | ICD-10-CM | POA: Diagnosis not present

## 2016-01-13 DIAGNOSIS — I110 Hypertensive heart disease with heart failure: Secondary | ICD-10-CM | POA: Diagnosis not present

## 2016-01-17 DIAGNOSIS — I110 Hypertensive heart disease with heart failure: Secondary | ICD-10-CM | POA: Diagnosis not present

## 2016-01-17 DIAGNOSIS — I8311 Varicose veins of right lower extremity with inflammation: Secondary | ICD-10-CM | POA: Diagnosis not present

## 2016-01-17 DIAGNOSIS — I8312 Varicose veins of left lower extremity with inflammation: Secondary | ICD-10-CM | POA: Diagnosis not present

## 2016-01-17 DIAGNOSIS — I69391 Dysphagia following cerebral infarction: Secondary | ICD-10-CM | POA: Diagnosis not present

## 2016-01-17 DIAGNOSIS — I5032 Chronic diastolic (congestive) heart failure: Secondary | ICD-10-CM | POA: Diagnosis not present

## 2016-01-17 DIAGNOSIS — E1140 Type 2 diabetes mellitus with diabetic neuropathy, unspecified: Secondary | ICD-10-CM | POA: Diagnosis not present

## 2016-01-24 DIAGNOSIS — I69391 Dysphagia following cerebral infarction: Secondary | ICD-10-CM | POA: Diagnosis not present

## 2016-01-24 DIAGNOSIS — E1140 Type 2 diabetes mellitus with diabetic neuropathy, unspecified: Secondary | ICD-10-CM | POA: Diagnosis not present

## 2016-01-24 DIAGNOSIS — I110 Hypertensive heart disease with heart failure: Secondary | ICD-10-CM | POA: Diagnosis not present

## 2016-01-24 DIAGNOSIS — I5032 Chronic diastolic (congestive) heart failure: Secondary | ICD-10-CM | POA: Diagnosis not present

## 2016-01-24 DIAGNOSIS — I8311 Varicose veins of right lower extremity with inflammation: Secondary | ICD-10-CM | POA: Diagnosis not present

## 2016-01-24 DIAGNOSIS — I8312 Varicose veins of left lower extremity with inflammation: Secondary | ICD-10-CM | POA: Diagnosis not present

## 2016-01-31 DIAGNOSIS — I110 Hypertensive heart disease with heart failure: Secondary | ICD-10-CM | POA: Diagnosis not present

## 2016-01-31 DIAGNOSIS — I5032 Chronic diastolic (congestive) heart failure: Secondary | ICD-10-CM | POA: Diagnosis not present

## 2016-01-31 DIAGNOSIS — E1140 Type 2 diabetes mellitus with diabetic neuropathy, unspecified: Secondary | ICD-10-CM | POA: Diagnosis not present

## 2016-01-31 DIAGNOSIS — I8312 Varicose veins of left lower extremity with inflammation: Secondary | ICD-10-CM | POA: Diagnosis not present

## 2016-01-31 DIAGNOSIS — I8311 Varicose veins of right lower extremity with inflammation: Secondary | ICD-10-CM | POA: Diagnosis not present

## 2016-01-31 DIAGNOSIS — I69391 Dysphagia following cerebral infarction: Secondary | ICD-10-CM | POA: Diagnosis not present

## 2016-02-02 DIAGNOSIS — Z794 Long term (current) use of insulin: Secondary | ICD-10-CM | POA: Diagnosis not present

## 2016-02-02 DIAGNOSIS — N400 Enlarged prostate without lower urinary tract symptoms: Secondary | ICD-10-CM | POA: Diagnosis not present

## 2016-02-02 DIAGNOSIS — B351 Tinea unguium: Secondary | ICD-10-CM | POA: Diagnosis not present

## 2016-02-02 DIAGNOSIS — M179 Osteoarthritis of knee, unspecified: Secondary | ICD-10-CM | POA: Diagnosis not present

## 2016-02-02 DIAGNOSIS — I110 Hypertensive heart disease with heart failure: Secondary | ICD-10-CM | POA: Diagnosis not present

## 2016-02-02 DIAGNOSIS — E1149 Type 2 diabetes mellitus with other diabetic neurological complication: Secondary | ICD-10-CM | POA: Diagnosis not present

## 2016-02-02 DIAGNOSIS — R269 Unspecified abnormalities of gait and mobility: Secondary | ICD-10-CM | POA: Diagnosis not present

## 2016-02-02 DIAGNOSIS — E559 Vitamin D deficiency, unspecified: Secondary | ICD-10-CM | POA: Diagnosis not present

## 2016-02-02 DIAGNOSIS — G4730 Sleep apnea, unspecified: Secondary | ICD-10-CM | POA: Diagnosis not present

## 2016-02-02 DIAGNOSIS — I5032 Chronic diastolic (congestive) heart failure: Secondary | ICD-10-CM | POA: Diagnosis not present

## 2016-02-02 DIAGNOSIS — L89152 Pressure ulcer of sacral region, stage 2: Secondary | ICD-10-CM | POA: Diagnosis not present

## 2016-02-02 DIAGNOSIS — I8311 Varicose veins of right lower extremity with inflammation: Secondary | ICD-10-CM | POA: Diagnosis not present

## 2016-02-07 DIAGNOSIS — I5032 Chronic diastolic (congestive) heart failure: Secondary | ICD-10-CM | POA: Diagnosis not present

## 2016-02-07 DIAGNOSIS — I110 Hypertensive heart disease with heart failure: Secondary | ICD-10-CM | POA: Diagnosis not present

## 2016-02-07 DIAGNOSIS — I8312 Varicose veins of left lower extremity with inflammation: Secondary | ICD-10-CM | POA: Diagnosis not present

## 2016-02-07 DIAGNOSIS — E1140 Type 2 diabetes mellitus with diabetic neuropathy, unspecified: Secondary | ICD-10-CM | POA: Diagnosis not present

## 2016-02-07 DIAGNOSIS — I69391 Dysphagia following cerebral infarction: Secondary | ICD-10-CM | POA: Diagnosis not present

## 2016-02-07 DIAGNOSIS — I8311 Varicose veins of right lower extremity with inflammation: Secondary | ICD-10-CM | POA: Diagnosis not present

## 2016-02-10 DIAGNOSIS — I8311 Varicose veins of right lower extremity with inflammation: Secondary | ICD-10-CM | POA: Diagnosis not present

## 2016-02-10 DIAGNOSIS — E1140 Type 2 diabetes mellitus with diabetic neuropathy, unspecified: Secondary | ICD-10-CM | POA: Diagnosis not present

## 2016-02-10 DIAGNOSIS — I5032 Chronic diastolic (congestive) heart failure: Secondary | ICD-10-CM | POA: Diagnosis not present

## 2016-02-10 DIAGNOSIS — I69391 Dysphagia following cerebral infarction: Secondary | ICD-10-CM | POA: Diagnosis not present

## 2016-02-10 DIAGNOSIS — I8312 Varicose veins of left lower extremity with inflammation: Secondary | ICD-10-CM | POA: Diagnosis not present

## 2016-02-10 DIAGNOSIS — I110 Hypertensive heart disease with heart failure: Secondary | ICD-10-CM | POA: Diagnosis not present

## 2016-02-11 DIAGNOSIS — I110 Hypertensive heart disease with heart failure: Secondary | ICD-10-CM | POA: Diagnosis not present

## 2016-02-11 DIAGNOSIS — I8312 Varicose veins of left lower extremity with inflammation: Secondary | ICD-10-CM | POA: Diagnosis not present

## 2016-02-11 DIAGNOSIS — I69391 Dysphagia following cerebral infarction: Secondary | ICD-10-CM | POA: Diagnosis not present

## 2016-02-11 DIAGNOSIS — I5032 Chronic diastolic (congestive) heart failure: Secondary | ICD-10-CM | POA: Diagnosis not present

## 2016-02-11 DIAGNOSIS — E1140 Type 2 diabetes mellitus with diabetic neuropathy, unspecified: Secondary | ICD-10-CM | POA: Diagnosis not present

## 2016-02-11 DIAGNOSIS — I8311 Varicose veins of right lower extremity with inflammation: Secondary | ICD-10-CM | POA: Diagnosis not present

## 2016-02-14 DIAGNOSIS — I8312 Varicose veins of left lower extremity with inflammation: Secondary | ICD-10-CM | POA: Diagnosis not present

## 2016-02-14 DIAGNOSIS — I5032 Chronic diastolic (congestive) heart failure: Secondary | ICD-10-CM | POA: Diagnosis not present

## 2016-02-14 DIAGNOSIS — I69391 Dysphagia following cerebral infarction: Secondary | ICD-10-CM | POA: Diagnosis not present

## 2016-02-14 DIAGNOSIS — I110 Hypertensive heart disease with heart failure: Secondary | ICD-10-CM | POA: Diagnosis not present

## 2016-02-14 DIAGNOSIS — E1140 Type 2 diabetes mellitus with diabetic neuropathy, unspecified: Secondary | ICD-10-CM | POA: Diagnosis not present

## 2016-02-14 DIAGNOSIS — I8311 Varicose veins of right lower extremity with inflammation: Secondary | ICD-10-CM | POA: Diagnosis not present

## 2016-02-17 DIAGNOSIS — L89312 Pressure ulcer of right buttock, stage 2: Secondary | ICD-10-CM | POA: Diagnosis not present

## 2016-02-17 DIAGNOSIS — I83811 Varicose veins of right lower extremities with pain: Secondary | ICD-10-CM | POA: Diagnosis not present

## 2016-02-17 DIAGNOSIS — I83812 Varicose veins of left lower extremities with pain: Secondary | ICD-10-CM | POA: Diagnosis not present

## 2016-02-17 DIAGNOSIS — Z7982 Long term (current) use of aspirin: Secondary | ICD-10-CM | POA: Diagnosis not present

## 2016-02-17 DIAGNOSIS — I4891 Unspecified atrial fibrillation: Secondary | ICD-10-CM | POA: Diagnosis not present

## 2016-02-17 DIAGNOSIS — I69391 Dysphagia following cerebral infarction: Secondary | ICD-10-CM | POA: Diagnosis not present

## 2016-02-17 DIAGNOSIS — I10 Essential (primary) hypertension: Secondary | ICD-10-CM | POA: Diagnosis not present

## 2016-02-17 DIAGNOSIS — I110 Hypertensive heart disease with heart failure: Secondary | ICD-10-CM | POA: Diagnosis not present

## 2016-02-17 DIAGNOSIS — Z794 Long term (current) use of insulin: Secondary | ICD-10-CM | POA: Diagnosis not present

## 2016-02-17 DIAGNOSIS — M179 Osteoarthritis of knee, unspecified: Secondary | ICD-10-CM | POA: Diagnosis not present

## 2016-02-17 DIAGNOSIS — I69322 Dysarthria following cerebral infarction: Secondary | ICD-10-CM | POA: Diagnosis not present

## 2016-02-17 DIAGNOSIS — G510 Bell's palsy: Secondary | ICD-10-CM | POA: Diagnosis not present

## 2016-02-17 DIAGNOSIS — Z8546 Personal history of malignant neoplasm of prostate: Secondary | ICD-10-CM | POA: Diagnosis not present

## 2016-02-17 DIAGNOSIS — E1140 Type 2 diabetes mellitus with diabetic neuropathy, unspecified: Secondary | ICD-10-CM | POA: Diagnosis not present

## 2016-02-17 DIAGNOSIS — I8312 Varicose veins of left lower extremity with inflammation: Secondary | ICD-10-CM | POA: Diagnosis not present

## 2016-02-17 DIAGNOSIS — R1310 Dysphagia, unspecified: Secondary | ICD-10-CM | POA: Diagnosis not present

## 2016-02-17 DIAGNOSIS — B351 Tinea unguium: Secondary | ICD-10-CM | POA: Diagnosis not present

## 2016-02-17 DIAGNOSIS — I8311 Varicose veins of right lower extremity with inflammation: Secondary | ICD-10-CM | POA: Diagnosis not present

## 2016-02-17 DIAGNOSIS — I5032 Chronic diastolic (congestive) heart failure: Secondary | ICD-10-CM | POA: Diagnosis not present

## 2016-02-17 DIAGNOSIS — R32 Unspecified urinary incontinence: Secondary | ICD-10-CM | POA: Diagnosis not present

## 2016-02-21 DIAGNOSIS — I5032 Chronic diastolic (congestive) heart failure: Secondary | ICD-10-CM | POA: Diagnosis not present

## 2016-02-21 DIAGNOSIS — L89312 Pressure ulcer of right buttock, stage 2: Secondary | ICD-10-CM | POA: Diagnosis not present

## 2016-02-21 DIAGNOSIS — E1140 Type 2 diabetes mellitus with diabetic neuropathy, unspecified: Secondary | ICD-10-CM | POA: Diagnosis not present

## 2016-02-21 DIAGNOSIS — I110 Hypertensive heart disease with heart failure: Secondary | ICD-10-CM | POA: Diagnosis not present

## 2016-02-21 DIAGNOSIS — I8312 Varicose veins of left lower extremity with inflammation: Secondary | ICD-10-CM | POA: Diagnosis not present

## 2016-02-21 DIAGNOSIS — I8311 Varicose veins of right lower extremity with inflammation: Secondary | ICD-10-CM | POA: Diagnosis not present

## 2016-02-28 DIAGNOSIS — I110 Hypertensive heart disease with heart failure: Secondary | ICD-10-CM | POA: Diagnosis not present

## 2016-02-28 DIAGNOSIS — L89312 Pressure ulcer of right buttock, stage 2: Secondary | ICD-10-CM | POA: Diagnosis not present

## 2016-02-28 DIAGNOSIS — I8311 Varicose veins of right lower extremity with inflammation: Secondary | ICD-10-CM | POA: Diagnosis not present

## 2016-02-28 DIAGNOSIS — E1140 Type 2 diabetes mellitus with diabetic neuropathy, unspecified: Secondary | ICD-10-CM | POA: Diagnosis not present

## 2016-02-28 DIAGNOSIS — I8312 Varicose veins of left lower extremity with inflammation: Secondary | ICD-10-CM | POA: Diagnosis not present

## 2016-02-28 DIAGNOSIS — I5032 Chronic diastolic (congestive) heart failure: Secondary | ICD-10-CM | POA: Diagnosis not present

## 2016-03-03 DIAGNOSIS — E1140 Type 2 diabetes mellitus with diabetic neuropathy, unspecified: Secondary | ICD-10-CM | POA: Diagnosis not present

## 2016-03-03 DIAGNOSIS — I8312 Varicose veins of left lower extremity with inflammation: Secondary | ICD-10-CM | POA: Diagnosis not present

## 2016-03-03 DIAGNOSIS — L89312 Pressure ulcer of right buttock, stage 2: Secondary | ICD-10-CM | POA: Diagnosis not present

## 2016-03-03 DIAGNOSIS — I8311 Varicose veins of right lower extremity with inflammation: Secondary | ICD-10-CM | POA: Diagnosis not present

## 2016-03-03 DIAGNOSIS — I5032 Chronic diastolic (congestive) heart failure: Secondary | ICD-10-CM | POA: Diagnosis not present

## 2016-03-03 DIAGNOSIS — I110 Hypertensive heart disease with heart failure: Secondary | ICD-10-CM | POA: Diagnosis not present

## 2016-03-06 DIAGNOSIS — L89312 Pressure ulcer of right buttock, stage 2: Secondary | ICD-10-CM | POA: Diagnosis not present

## 2016-03-06 DIAGNOSIS — I8311 Varicose veins of right lower extremity with inflammation: Secondary | ICD-10-CM | POA: Diagnosis not present

## 2016-03-06 DIAGNOSIS — I8312 Varicose veins of left lower extremity with inflammation: Secondary | ICD-10-CM | POA: Diagnosis not present

## 2016-03-06 DIAGNOSIS — E1140 Type 2 diabetes mellitus with diabetic neuropathy, unspecified: Secondary | ICD-10-CM | POA: Diagnosis not present

## 2016-03-06 DIAGNOSIS — I5032 Chronic diastolic (congestive) heart failure: Secondary | ICD-10-CM | POA: Diagnosis not present

## 2016-03-06 DIAGNOSIS — I110 Hypertensive heart disease with heart failure: Secondary | ICD-10-CM | POA: Diagnosis not present

## 2016-03-07 DIAGNOSIS — E1140 Type 2 diabetes mellitus with diabetic neuropathy, unspecified: Secondary | ICD-10-CM | POA: Diagnosis not present

## 2016-03-07 DIAGNOSIS — L89312 Pressure ulcer of right buttock, stage 2: Secondary | ICD-10-CM | POA: Diagnosis not present

## 2016-03-07 DIAGNOSIS — I5032 Chronic diastolic (congestive) heart failure: Secondary | ICD-10-CM | POA: Diagnosis not present

## 2016-03-07 DIAGNOSIS — I110 Hypertensive heart disease with heart failure: Secondary | ICD-10-CM | POA: Diagnosis not present

## 2016-03-07 DIAGNOSIS — I8312 Varicose veins of left lower extremity with inflammation: Secondary | ICD-10-CM | POA: Diagnosis not present

## 2016-03-07 DIAGNOSIS — I8311 Varicose veins of right lower extremity with inflammation: Secondary | ICD-10-CM | POA: Diagnosis not present

## 2016-03-09 DIAGNOSIS — I8312 Varicose veins of left lower extremity with inflammation: Secondary | ICD-10-CM | POA: Diagnosis not present

## 2016-03-09 DIAGNOSIS — E1140 Type 2 diabetes mellitus with diabetic neuropathy, unspecified: Secondary | ICD-10-CM | POA: Diagnosis not present

## 2016-03-09 DIAGNOSIS — I8311 Varicose veins of right lower extremity with inflammation: Secondary | ICD-10-CM | POA: Diagnosis not present

## 2016-03-09 DIAGNOSIS — I110 Hypertensive heart disease with heart failure: Secondary | ICD-10-CM | POA: Diagnosis not present

## 2016-03-09 DIAGNOSIS — I5032 Chronic diastolic (congestive) heart failure: Secondary | ICD-10-CM | POA: Diagnosis not present

## 2016-03-09 DIAGNOSIS — L89312 Pressure ulcer of right buttock, stage 2: Secondary | ICD-10-CM | POA: Diagnosis not present

## 2016-03-13 DIAGNOSIS — I8311 Varicose veins of right lower extremity with inflammation: Secondary | ICD-10-CM | POA: Diagnosis not present

## 2016-03-13 DIAGNOSIS — I5032 Chronic diastolic (congestive) heart failure: Secondary | ICD-10-CM | POA: Diagnosis not present

## 2016-03-13 DIAGNOSIS — I8312 Varicose veins of left lower extremity with inflammation: Secondary | ICD-10-CM | POA: Diagnosis not present

## 2016-03-13 DIAGNOSIS — E1140 Type 2 diabetes mellitus with diabetic neuropathy, unspecified: Secondary | ICD-10-CM | POA: Diagnosis not present

## 2016-03-13 DIAGNOSIS — I110 Hypertensive heart disease with heart failure: Secondary | ICD-10-CM | POA: Diagnosis not present

## 2016-03-13 DIAGNOSIS — L89312 Pressure ulcer of right buttock, stage 2: Secondary | ICD-10-CM | POA: Diagnosis not present

## 2016-03-14 DIAGNOSIS — L89312 Pressure ulcer of right buttock, stage 2: Secondary | ICD-10-CM | POA: Diagnosis not present

## 2016-03-14 DIAGNOSIS — I8312 Varicose veins of left lower extremity with inflammation: Secondary | ICD-10-CM | POA: Diagnosis not present

## 2016-03-14 DIAGNOSIS — E1140 Type 2 diabetes mellitus with diabetic neuropathy, unspecified: Secondary | ICD-10-CM | POA: Diagnosis not present

## 2016-03-14 DIAGNOSIS — I5032 Chronic diastolic (congestive) heart failure: Secondary | ICD-10-CM | POA: Diagnosis not present

## 2016-03-14 DIAGNOSIS — I8311 Varicose veins of right lower extremity with inflammation: Secondary | ICD-10-CM | POA: Diagnosis not present

## 2016-03-14 DIAGNOSIS — I110 Hypertensive heart disease with heart failure: Secondary | ICD-10-CM | POA: Diagnosis not present

## 2016-03-20 DIAGNOSIS — I5032 Chronic diastolic (congestive) heart failure: Secondary | ICD-10-CM | POA: Diagnosis not present

## 2016-03-20 DIAGNOSIS — I8311 Varicose veins of right lower extremity with inflammation: Secondary | ICD-10-CM | POA: Diagnosis not present

## 2016-03-20 DIAGNOSIS — L89312 Pressure ulcer of right buttock, stage 2: Secondary | ICD-10-CM | POA: Diagnosis not present

## 2016-03-20 DIAGNOSIS — E1140 Type 2 diabetes mellitus with diabetic neuropathy, unspecified: Secondary | ICD-10-CM | POA: Diagnosis not present

## 2016-03-20 DIAGNOSIS — I110 Hypertensive heart disease with heart failure: Secondary | ICD-10-CM | POA: Diagnosis not present

## 2016-03-20 DIAGNOSIS — I8312 Varicose veins of left lower extremity with inflammation: Secondary | ICD-10-CM | POA: Diagnosis not present

## 2016-03-23 DIAGNOSIS — I8311 Varicose veins of right lower extremity with inflammation: Secondary | ICD-10-CM | POA: Diagnosis not present

## 2016-03-23 DIAGNOSIS — I5032 Chronic diastolic (congestive) heart failure: Secondary | ICD-10-CM | POA: Diagnosis not present

## 2016-03-23 DIAGNOSIS — L89312 Pressure ulcer of right buttock, stage 2: Secondary | ICD-10-CM | POA: Diagnosis not present

## 2016-03-23 DIAGNOSIS — E1140 Type 2 diabetes mellitus with diabetic neuropathy, unspecified: Secondary | ICD-10-CM | POA: Diagnosis not present

## 2016-03-23 DIAGNOSIS — I110 Hypertensive heart disease with heart failure: Secondary | ICD-10-CM | POA: Diagnosis not present

## 2016-03-23 DIAGNOSIS — I8312 Varicose veins of left lower extremity with inflammation: Secondary | ICD-10-CM | POA: Diagnosis not present

## 2016-03-27 DIAGNOSIS — I8312 Varicose veins of left lower extremity with inflammation: Secondary | ICD-10-CM | POA: Diagnosis not present

## 2016-03-27 DIAGNOSIS — I5032 Chronic diastolic (congestive) heart failure: Secondary | ICD-10-CM | POA: Diagnosis not present

## 2016-03-27 DIAGNOSIS — L89312 Pressure ulcer of right buttock, stage 2: Secondary | ICD-10-CM | POA: Diagnosis not present

## 2016-03-27 DIAGNOSIS — E1140 Type 2 diabetes mellitus with diabetic neuropathy, unspecified: Secondary | ICD-10-CM | POA: Diagnosis not present

## 2016-03-27 DIAGNOSIS — I110 Hypertensive heart disease with heart failure: Secondary | ICD-10-CM | POA: Diagnosis not present

## 2016-03-27 DIAGNOSIS — I8311 Varicose veins of right lower extremity with inflammation: Secondary | ICD-10-CM | POA: Diagnosis not present

## 2016-03-29 DIAGNOSIS — I8312 Varicose veins of left lower extremity with inflammation: Secondary | ICD-10-CM | POA: Diagnosis not present

## 2016-03-29 DIAGNOSIS — E1140 Type 2 diabetes mellitus with diabetic neuropathy, unspecified: Secondary | ICD-10-CM | POA: Diagnosis not present

## 2016-03-29 DIAGNOSIS — I5032 Chronic diastolic (congestive) heart failure: Secondary | ICD-10-CM | POA: Diagnosis not present

## 2016-03-29 DIAGNOSIS — I110 Hypertensive heart disease with heart failure: Secondary | ICD-10-CM | POA: Diagnosis not present

## 2016-03-29 DIAGNOSIS — L89312 Pressure ulcer of right buttock, stage 2: Secondary | ICD-10-CM | POA: Diagnosis not present

## 2016-03-29 DIAGNOSIS — I8311 Varicose veins of right lower extremity with inflammation: Secondary | ICD-10-CM | POA: Diagnosis not present

## 2016-03-31 DIAGNOSIS — E1140 Type 2 diabetes mellitus with diabetic neuropathy, unspecified: Secondary | ICD-10-CM | POA: Diagnosis not present

## 2016-03-31 DIAGNOSIS — I5032 Chronic diastolic (congestive) heart failure: Secondary | ICD-10-CM | POA: Diagnosis not present

## 2016-03-31 DIAGNOSIS — Z0000 Encounter for general adult medical examination without abnormal findings: Secondary | ICD-10-CM | POA: Diagnosis not present

## 2016-03-31 DIAGNOSIS — I8311 Varicose veins of right lower extremity with inflammation: Secondary | ICD-10-CM | POA: Diagnosis not present

## 2016-03-31 DIAGNOSIS — L89312 Pressure ulcer of right buttock, stage 2: Secondary | ICD-10-CM | POA: Diagnosis not present

## 2016-03-31 DIAGNOSIS — Z23 Encounter for immunization: Secondary | ICD-10-CM | POA: Diagnosis not present

## 2016-03-31 DIAGNOSIS — I8312 Varicose veins of left lower extremity with inflammation: Secondary | ICD-10-CM | POA: Diagnosis not present

## 2016-03-31 DIAGNOSIS — E669 Obesity, unspecified: Secondary | ICD-10-CM | POA: Diagnosis not present

## 2016-03-31 DIAGNOSIS — Z6838 Body mass index (BMI) 38.0-38.9, adult: Secondary | ICD-10-CM | POA: Diagnosis not present

## 2016-03-31 DIAGNOSIS — Z7189 Other specified counseling: Secondary | ICD-10-CM | POA: Diagnosis not present

## 2016-03-31 DIAGNOSIS — I110 Hypertensive heart disease with heart failure: Secondary | ICD-10-CM | POA: Diagnosis not present

## 2016-03-31 DIAGNOSIS — Z1389 Encounter for screening for other disorder: Secondary | ICD-10-CM | POA: Diagnosis not present

## 2016-04-04 DIAGNOSIS — I8311 Varicose veins of right lower extremity with inflammation: Secondary | ICD-10-CM | POA: Diagnosis not present

## 2016-04-04 DIAGNOSIS — I8312 Varicose veins of left lower extremity with inflammation: Secondary | ICD-10-CM | POA: Diagnosis not present

## 2016-04-04 DIAGNOSIS — E1140 Type 2 diabetes mellitus with diabetic neuropathy, unspecified: Secondary | ICD-10-CM | POA: Diagnosis not present

## 2016-04-04 DIAGNOSIS — L89312 Pressure ulcer of right buttock, stage 2: Secondary | ICD-10-CM | POA: Diagnosis not present

## 2016-04-04 DIAGNOSIS — I5032 Chronic diastolic (congestive) heart failure: Secondary | ICD-10-CM | POA: Diagnosis not present

## 2016-04-04 DIAGNOSIS — I110 Hypertensive heart disease with heart failure: Secondary | ICD-10-CM | POA: Diagnosis not present

## 2016-04-06 DIAGNOSIS — I110 Hypertensive heart disease with heart failure: Secondary | ICD-10-CM | POA: Diagnosis not present

## 2016-04-06 DIAGNOSIS — E1140 Type 2 diabetes mellitus with diabetic neuropathy, unspecified: Secondary | ICD-10-CM | POA: Diagnosis not present

## 2016-04-06 DIAGNOSIS — L89312 Pressure ulcer of right buttock, stage 2: Secondary | ICD-10-CM | POA: Diagnosis not present

## 2016-04-06 DIAGNOSIS — I8311 Varicose veins of right lower extremity with inflammation: Secondary | ICD-10-CM | POA: Diagnosis not present

## 2016-04-06 DIAGNOSIS — I5032 Chronic diastolic (congestive) heart failure: Secondary | ICD-10-CM | POA: Diagnosis not present

## 2016-04-06 DIAGNOSIS — I8312 Varicose veins of left lower extremity with inflammation: Secondary | ICD-10-CM | POA: Diagnosis not present

## 2016-04-10 DIAGNOSIS — L89312 Pressure ulcer of right buttock, stage 2: Secondary | ICD-10-CM | POA: Diagnosis not present

## 2016-04-10 DIAGNOSIS — I8312 Varicose veins of left lower extremity with inflammation: Secondary | ICD-10-CM | POA: Diagnosis not present

## 2016-04-10 DIAGNOSIS — I5032 Chronic diastolic (congestive) heart failure: Secondary | ICD-10-CM | POA: Diagnosis not present

## 2016-04-10 DIAGNOSIS — I110 Hypertensive heart disease with heart failure: Secondary | ICD-10-CM | POA: Diagnosis not present

## 2016-04-10 DIAGNOSIS — I8311 Varicose veins of right lower extremity with inflammation: Secondary | ICD-10-CM | POA: Diagnosis not present

## 2016-04-10 DIAGNOSIS — E1140 Type 2 diabetes mellitus with diabetic neuropathy, unspecified: Secondary | ICD-10-CM | POA: Diagnosis not present

## 2016-04-13 DIAGNOSIS — L89312 Pressure ulcer of right buttock, stage 2: Secondary | ICD-10-CM | POA: Diagnosis not present

## 2016-04-13 DIAGNOSIS — I8311 Varicose veins of right lower extremity with inflammation: Secondary | ICD-10-CM | POA: Diagnosis not present

## 2016-04-13 DIAGNOSIS — I8312 Varicose veins of left lower extremity with inflammation: Secondary | ICD-10-CM | POA: Diagnosis not present

## 2016-04-13 DIAGNOSIS — I5032 Chronic diastolic (congestive) heart failure: Secondary | ICD-10-CM | POA: Diagnosis not present

## 2016-04-13 DIAGNOSIS — E1140 Type 2 diabetes mellitus with diabetic neuropathy, unspecified: Secondary | ICD-10-CM | POA: Diagnosis not present

## 2016-04-13 DIAGNOSIS — I110 Hypertensive heart disease with heart failure: Secondary | ICD-10-CM | POA: Diagnosis not present

## 2016-04-17 DIAGNOSIS — I8312 Varicose veins of left lower extremity with inflammation: Secondary | ICD-10-CM | POA: Diagnosis not present

## 2016-04-17 DIAGNOSIS — I110 Hypertensive heart disease with heart failure: Secondary | ICD-10-CM | POA: Diagnosis not present

## 2016-04-17 DIAGNOSIS — Z794 Long term (current) use of insulin: Secondary | ICD-10-CM | POA: Diagnosis not present

## 2016-04-17 DIAGNOSIS — M179 Osteoarthritis of knee, unspecified: Secondary | ICD-10-CM | POA: Diagnosis not present

## 2016-04-17 DIAGNOSIS — I4891 Unspecified atrial fibrillation: Secondary | ICD-10-CM | POA: Diagnosis not present

## 2016-04-17 DIAGNOSIS — I69391 Dysphagia following cerebral infarction: Secondary | ICD-10-CM | POA: Diagnosis not present

## 2016-04-17 DIAGNOSIS — Z8546 Personal history of malignant neoplasm of prostate: Secondary | ICD-10-CM | POA: Diagnosis not present

## 2016-04-17 DIAGNOSIS — R32 Unspecified urinary incontinence: Secondary | ICD-10-CM | POA: Diagnosis not present

## 2016-04-17 DIAGNOSIS — I8311 Varicose veins of right lower extremity with inflammation: Secondary | ICD-10-CM | POA: Diagnosis not present

## 2016-04-17 DIAGNOSIS — I5032 Chronic diastolic (congestive) heart failure: Secondary | ICD-10-CM | POA: Diagnosis not present

## 2016-04-17 DIAGNOSIS — G510 Bell's palsy: Secondary | ICD-10-CM | POA: Diagnosis not present

## 2016-04-17 DIAGNOSIS — B351 Tinea unguium: Secondary | ICD-10-CM | POA: Diagnosis not present

## 2016-04-17 DIAGNOSIS — Z7982 Long term (current) use of aspirin: Secondary | ICD-10-CM | POA: Diagnosis not present

## 2016-04-17 DIAGNOSIS — I69322 Dysarthria following cerebral infarction: Secondary | ICD-10-CM | POA: Diagnosis not present

## 2016-04-17 DIAGNOSIS — R1310 Dysphagia, unspecified: Secondary | ICD-10-CM | POA: Diagnosis not present

## 2016-04-17 DIAGNOSIS — E1140 Type 2 diabetes mellitus with diabetic neuropathy, unspecified: Secondary | ICD-10-CM | POA: Diagnosis not present

## 2016-04-18 DIAGNOSIS — I5032 Chronic diastolic (congestive) heart failure: Secondary | ICD-10-CM | POA: Diagnosis not present

## 2016-04-18 DIAGNOSIS — E1140 Type 2 diabetes mellitus with diabetic neuropathy, unspecified: Secondary | ICD-10-CM | POA: Diagnosis not present

## 2016-04-18 DIAGNOSIS — I8312 Varicose veins of left lower extremity with inflammation: Secondary | ICD-10-CM | POA: Diagnosis not present

## 2016-04-18 DIAGNOSIS — I69391 Dysphagia following cerebral infarction: Secondary | ICD-10-CM | POA: Diagnosis not present

## 2016-04-18 DIAGNOSIS — I8311 Varicose veins of right lower extremity with inflammation: Secondary | ICD-10-CM | POA: Diagnosis not present

## 2016-04-18 DIAGNOSIS — I110 Hypertensive heart disease with heart failure: Secondary | ICD-10-CM | POA: Diagnosis not present

## 2016-04-19 DIAGNOSIS — I5032 Chronic diastolic (congestive) heart failure: Secondary | ICD-10-CM | POA: Diagnosis not present

## 2016-04-19 DIAGNOSIS — I69391 Dysphagia following cerebral infarction: Secondary | ICD-10-CM | POA: Diagnosis not present

## 2016-04-19 DIAGNOSIS — I110 Hypertensive heart disease with heart failure: Secondary | ICD-10-CM | POA: Diagnosis not present

## 2016-04-19 DIAGNOSIS — E1140 Type 2 diabetes mellitus with diabetic neuropathy, unspecified: Secondary | ICD-10-CM | POA: Diagnosis not present

## 2016-04-19 DIAGNOSIS — I8311 Varicose veins of right lower extremity with inflammation: Secondary | ICD-10-CM | POA: Diagnosis not present

## 2016-04-19 DIAGNOSIS — I8312 Varicose veins of left lower extremity with inflammation: Secondary | ICD-10-CM | POA: Diagnosis not present

## 2016-04-25 DIAGNOSIS — I69391 Dysphagia following cerebral infarction: Secondary | ICD-10-CM | POA: Diagnosis not present

## 2016-04-25 DIAGNOSIS — I8312 Varicose veins of left lower extremity with inflammation: Secondary | ICD-10-CM | POA: Diagnosis not present

## 2016-04-25 DIAGNOSIS — I5032 Chronic diastolic (congestive) heart failure: Secondary | ICD-10-CM | POA: Diagnosis not present

## 2016-04-25 DIAGNOSIS — I8311 Varicose veins of right lower extremity with inflammation: Secondary | ICD-10-CM | POA: Diagnosis not present

## 2016-04-25 DIAGNOSIS — I110 Hypertensive heart disease with heart failure: Secondary | ICD-10-CM | POA: Diagnosis not present

## 2016-04-25 DIAGNOSIS — E1140 Type 2 diabetes mellitus with diabetic neuropathy, unspecified: Secondary | ICD-10-CM | POA: Diagnosis not present

## 2016-05-01 DIAGNOSIS — I8311 Varicose veins of right lower extremity with inflammation: Secondary | ICD-10-CM | POA: Diagnosis not present

## 2016-05-01 DIAGNOSIS — E1140 Type 2 diabetes mellitus with diabetic neuropathy, unspecified: Secondary | ICD-10-CM | POA: Diagnosis not present

## 2016-05-01 DIAGNOSIS — I8312 Varicose veins of left lower extremity with inflammation: Secondary | ICD-10-CM | POA: Diagnosis not present

## 2016-05-01 DIAGNOSIS — I69391 Dysphagia following cerebral infarction: Secondary | ICD-10-CM | POA: Diagnosis not present

## 2016-05-01 DIAGNOSIS — I110 Hypertensive heart disease with heart failure: Secondary | ICD-10-CM | POA: Diagnosis not present

## 2016-05-01 DIAGNOSIS — I5032 Chronic diastolic (congestive) heart failure: Secondary | ICD-10-CM | POA: Diagnosis not present

## 2016-05-02 DIAGNOSIS — I8311 Varicose veins of right lower extremity with inflammation: Secondary | ICD-10-CM | POA: Diagnosis not present

## 2016-05-02 DIAGNOSIS — I5032 Chronic diastolic (congestive) heart failure: Secondary | ICD-10-CM | POA: Diagnosis not present

## 2016-05-02 DIAGNOSIS — E1140 Type 2 diabetes mellitus with diabetic neuropathy, unspecified: Secondary | ICD-10-CM | POA: Diagnosis not present

## 2016-05-02 DIAGNOSIS — I110 Hypertensive heart disease with heart failure: Secondary | ICD-10-CM | POA: Diagnosis not present

## 2016-05-02 DIAGNOSIS — I8312 Varicose veins of left lower extremity with inflammation: Secondary | ICD-10-CM | POA: Diagnosis not present

## 2016-05-02 DIAGNOSIS — I69391 Dysphagia following cerebral infarction: Secondary | ICD-10-CM | POA: Diagnosis not present

## 2016-05-08 DIAGNOSIS — I110 Hypertensive heart disease with heart failure: Secondary | ICD-10-CM | POA: Diagnosis not present

## 2016-05-08 DIAGNOSIS — I5032 Chronic diastolic (congestive) heart failure: Secondary | ICD-10-CM | POA: Diagnosis not present

## 2016-05-08 DIAGNOSIS — I8312 Varicose veins of left lower extremity with inflammation: Secondary | ICD-10-CM | POA: Diagnosis not present

## 2016-05-08 DIAGNOSIS — I8311 Varicose veins of right lower extremity with inflammation: Secondary | ICD-10-CM | POA: Diagnosis not present

## 2016-05-08 DIAGNOSIS — E1140 Type 2 diabetes mellitus with diabetic neuropathy, unspecified: Secondary | ICD-10-CM | POA: Diagnosis not present

## 2016-05-08 DIAGNOSIS — I69391 Dysphagia following cerebral infarction: Secondary | ICD-10-CM | POA: Diagnosis not present

## 2016-05-09 DIAGNOSIS — I8311 Varicose veins of right lower extremity with inflammation: Secondary | ICD-10-CM | POA: Diagnosis not present

## 2016-05-09 DIAGNOSIS — I5032 Chronic diastolic (congestive) heart failure: Secondary | ICD-10-CM | POA: Diagnosis not present

## 2016-05-09 DIAGNOSIS — I110 Hypertensive heart disease with heart failure: Secondary | ICD-10-CM | POA: Diagnosis not present

## 2016-05-09 DIAGNOSIS — E1140 Type 2 diabetes mellitus with diabetic neuropathy, unspecified: Secondary | ICD-10-CM | POA: Diagnosis not present

## 2016-05-09 DIAGNOSIS — I8312 Varicose veins of left lower extremity with inflammation: Secondary | ICD-10-CM | POA: Diagnosis not present

## 2016-05-09 DIAGNOSIS — I69391 Dysphagia following cerebral infarction: Secondary | ICD-10-CM | POA: Diagnosis not present

## 2016-05-17 DIAGNOSIS — I8311 Varicose veins of right lower extremity with inflammation: Secondary | ICD-10-CM | POA: Diagnosis not present

## 2016-05-17 DIAGNOSIS — I69391 Dysphagia following cerebral infarction: Secondary | ICD-10-CM | POA: Diagnosis not present

## 2016-05-17 DIAGNOSIS — I110 Hypertensive heart disease with heart failure: Secondary | ICD-10-CM | POA: Diagnosis not present

## 2016-05-17 DIAGNOSIS — I5032 Chronic diastolic (congestive) heart failure: Secondary | ICD-10-CM | POA: Diagnosis not present

## 2016-05-17 DIAGNOSIS — E1140 Type 2 diabetes mellitus with diabetic neuropathy, unspecified: Secondary | ICD-10-CM | POA: Diagnosis not present

## 2016-05-17 DIAGNOSIS — I8312 Varicose veins of left lower extremity with inflammation: Secondary | ICD-10-CM | POA: Diagnosis not present

## 2016-05-23 DIAGNOSIS — I69391 Dysphagia following cerebral infarction: Secondary | ICD-10-CM | POA: Diagnosis not present

## 2016-05-23 DIAGNOSIS — I5032 Chronic diastolic (congestive) heart failure: Secondary | ICD-10-CM | POA: Diagnosis not present

## 2016-05-23 DIAGNOSIS — I8312 Varicose veins of left lower extremity with inflammation: Secondary | ICD-10-CM | POA: Diagnosis not present

## 2016-05-23 DIAGNOSIS — I8311 Varicose veins of right lower extremity with inflammation: Secondary | ICD-10-CM | POA: Diagnosis not present

## 2016-05-23 DIAGNOSIS — I110 Hypertensive heart disease with heart failure: Secondary | ICD-10-CM | POA: Diagnosis not present

## 2016-05-23 DIAGNOSIS — E1140 Type 2 diabetes mellitus with diabetic neuropathy, unspecified: Secondary | ICD-10-CM | POA: Diagnosis not present

## 2016-05-26 DIAGNOSIS — I8311 Varicose veins of right lower extremity with inflammation: Secondary | ICD-10-CM | POA: Diagnosis not present

## 2016-05-26 DIAGNOSIS — I5032 Chronic diastolic (congestive) heart failure: Secondary | ICD-10-CM | POA: Diagnosis not present

## 2016-05-26 DIAGNOSIS — I69391 Dysphagia following cerebral infarction: Secondary | ICD-10-CM | POA: Diagnosis not present

## 2016-05-26 DIAGNOSIS — I8312 Varicose veins of left lower extremity with inflammation: Secondary | ICD-10-CM | POA: Diagnosis not present

## 2016-05-26 DIAGNOSIS — I110 Hypertensive heart disease with heart failure: Secondary | ICD-10-CM | POA: Diagnosis not present

## 2016-05-26 DIAGNOSIS — E1140 Type 2 diabetes mellitus with diabetic neuropathy, unspecified: Secondary | ICD-10-CM | POA: Diagnosis not present

## 2016-05-30 DIAGNOSIS — I69391 Dysphagia following cerebral infarction: Secondary | ICD-10-CM | POA: Diagnosis not present

## 2016-05-30 DIAGNOSIS — E1140 Type 2 diabetes mellitus with diabetic neuropathy, unspecified: Secondary | ICD-10-CM | POA: Diagnosis not present

## 2016-05-30 DIAGNOSIS — I8311 Varicose veins of right lower extremity with inflammation: Secondary | ICD-10-CM | POA: Diagnosis not present

## 2016-05-30 DIAGNOSIS — I110 Hypertensive heart disease with heart failure: Secondary | ICD-10-CM | POA: Diagnosis not present

## 2016-05-30 DIAGNOSIS — I10 Essential (primary) hypertension: Secondary | ICD-10-CM | POA: Diagnosis not present

## 2016-05-30 DIAGNOSIS — I5032 Chronic diastolic (congestive) heart failure: Secondary | ICD-10-CM | POA: Diagnosis not present

## 2016-05-30 DIAGNOSIS — I8312 Varicose veins of left lower extremity with inflammation: Secondary | ICD-10-CM | POA: Diagnosis not present

## 2016-05-31 DIAGNOSIS — I69391 Dysphagia following cerebral infarction: Secondary | ICD-10-CM | POA: Diagnosis not present

## 2016-05-31 DIAGNOSIS — I110 Hypertensive heart disease with heart failure: Secondary | ICD-10-CM | POA: Diagnosis not present

## 2016-05-31 DIAGNOSIS — I5032 Chronic diastolic (congestive) heart failure: Secondary | ICD-10-CM | POA: Diagnosis not present

## 2016-05-31 DIAGNOSIS — I8312 Varicose veins of left lower extremity with inflammation: Secondary | ICD-10-CM | POA: Diagnosis not present

## 2016-05-31 DIAGNOSIS — I8311 Varicose veins of right lower extremity with inflammation: Secondary | ICD-10-CM | POA: Diagnosis not present

## 2016-05-31 DIAGNOSIS — E1140 Type 2 diabetes mellitus with diabetic neuropathy, unspecified: Secondary | ICD-10-CM | POA: Diagnosis not present

## 2016-06-02 DIAGNOSIS — I69391 Dysphagia following cerebral infarction: Secondary | ICD-10-CM | POA: Diagnosis not present

## 2016-06-02 DIAGNOSIS — E1140 Type 2 diabetes mellitus with diabetic neuropathy, unspecified: Secondary | ICD-10-CM | POA: Diagnosis not present

## 2016-06-02 DIAGNOSIS — I8312 Varicose veins of left lower extremity with inflammation: Secondary | ICD-10-CM | POA: Diagnosis not present

## 2016-06-02 DIAGNOSIS — I8311 Varicose veins of right lower extremity with inflammation: Secondary | ICD-10-CM | POA: Diagnosis not present

## 2016-06-02 DIAGNOSIS — I5032 Chronic diastolic (congestive) heart failure: Secondary | ICD-10-CM | POA: Diagnosis not present

## 2016-06-02 DIAGNOSIS — I110 Hypertensive heart disease with heart failure: Secondary | ICD-10-CM | POA: Diagnosis not present

## 2016-06-05 DIAGNOSIS — I8311 Varicose veins of right lower extremity with inflammation: Secondary | ICD-10-CM | POA: Diagnosis not present

## 2016-06-05 DIAGNOSIS — E1140 Type 2 diabetes mellitus with diabetic neuropathy, unspecified: Secondary | ICD-10-CM | POA: Diagnosis not present

## 2016-06-05 DIAGNOSIS — I110 Hypertensive heart disease with heart failure: Secondary | ICD-10-CM | POA: Diagnosis not present

## 2016-06-05 DIAGNOSIS — I5032 Chronic diastolic (congestive) heart failure: Secondary | ICD-10-CM | POA: Diagnosis not present

## 2016-06-05 DIAGNOSIS — I69391 Dysphagia following cerebral infarction: Secondary | ICD-10-CM | POA: Diagnosis not present

## 2016-06-05 DIAGNOSIS — I8312 Varicose veins of left lower extremity with inflammation: Secondary | ICD-10-CM | POA: Diagnosis not present

## 2016-06-07 DIAGNOSIS — I110 Hypertensive heart disease with heart failure: Secondary | ICD-10-CM | POA: Diagnosis not present

## 2016-06-07 DIAGNOSIS — I69391 Dysphagia following cerebral infarction: Secondary | ICD-10-CM | POA: Diagnosis not present

## 2016-06-07 DIAGNOSIS — I5032 Chronic diastolic (congestive) heart failure: Secondary | ICD-10-CM | POA: Diagnosis not present

## 2016-06-07 DIAGNOSIS — I8311 Varicose veins of right lower extremity with inflammation: Secondary | ICD-10-CM | POA: Diagnosis not present

## 2016-06-07 DIAGNOSIS — E1140 Type 2 diabetes mellitus with diabetic neuropathy, unspecified: Secondary | ICD-10-CM | POA: Diagnosis not present

## 2016-06-07 DIAGNOSIS — I8312 Varicose veins of left lower extremity with inflammation: Secondary | ICD-10-CM | POA: Diagnosis not present

## 2016-06-12 DIAGNOSIS — I8312 Varicose veins of left lower extremity with inflammation: Secondary | ICD-10-CM | POA: Diagnosis not present

## 2016-06-12 DIAGNOSIS — I69391 Dysphagia following cerebral infarction: Secondary | ICD-10-CM | POA: Diagnosis not present

## 2016-06-12 DIAGNOSIS — I110 Hypertensive heart disease with heart failure: Secondary | ICD-10-CM | POA: Diagnosis not present

## 2016-06-12 DIAGNOSIS — I5032 Chronic diastolic (congestive) heart failure: Secondary | ICD-10-CM | POA: Diagnosis not present

## 2016-06-12 DIAGNOSIS — E1140 Type 2 diabetes mellitus with diabetic neuropathy, unspecified: Secondary | ICD-10-CM | POA: Diagnosis not present

## 2016-06-12 DIAGNOSIS — I8311 Varicose veins of right lower extremity with inflammation: Secondary | ICD-10-CM | POA: Diagnosis not present

## 2016-06-15 DIAGNOSIS — I8311 Varicose veins of right lower extremity with inflammation: Secondary | ICD-10-CM | POA: Diagnosis not present

## 2016-06-15 DIAGNOSIS — E1140 Type 2 diabetes mellitus with diabetic neuropathy, unspecified: Secondary | ICD-10-CM | POA: Diagnosis not present

## 2016-06-15 DIAGNOSIS — I8312 Varicose veins of left lower extremity with inflammation: Secondary | ICD-10-CM | POA: Diagnosis not present

## 2016-06-15 DIAGNOSIS — I5032 Chronic diastolic (congestive) heart failure: Secondary | ICD-10-CM | POA: Diagnosis not present

## 2016-06-15 DIAGNOSIS — I69391 Dysphagia following cerebral infarction: Secondary | ICD-10-CM | POA: Diagnosis not present

## 2016-06-15 DIAGNOSIS — I110 Hypertensive heart disease with heart failure: Secondary | ICD-10-CM | POA: Diagnosis not present

## 2016-06-16 DIAGNOSIS — R1310 Dysphagia, unspecified: Secondary | ICD-10-CM | POA: Diagnosis not present

## 2016-06-16 DIAGNOSIS — I8312 Varicose veins of left lower extremity with inflammation: Secondary | ICD-10-CM | POA: Diagnosis not present

## 2016-06-16 DIAGNOSIS — I69391 Dysphagia following cerebral infarction: Secondary | ICD-10-CM | POA: Diagnosis not present

## 2016-06-16 DIAGNOSIS — E1140 Type 2 diabetes mellitus with diabetic neuropathy, unspecified: Secondary | ICD-10-CM | POA: Diagnosis not present

## 2016-06-16 DIAGNOSIS — R32 Unspecified urinary incontinence: Secondary | ICD-10-CM | POA: Diagnosis not present

## 2016-06-16 DIAGNOSIS — B351 Tinea unguium: Secondary | ICD-10-CM | POA: Diagnosis not present

## 2016-06-16 DIAGNOSIS — Z794 Long term (current) use of insulin: Secondary | ICD-10-CM | POA: Diagnosis not present

## 2016-06-16 DIAGNOSIS — Z7982 Long term (current) use of aspirin: Secondary | ICD-10-CM | POA: Diagnosis not present

## 2016-06-16 DIAGNOSIS — I110 Hypertensive heart disease with heart failure: Secondary | ICD-10-CM | POA: Diagnosis not present

## 2016-06-16 DIAGNOSIS — I8311 Varicose veins of right lower extremity with inflammation: Secondary | ICD-10-CM | POA: Diagnosis not present

## 2016-06-16 DIAGNOSIS — I69322 Dysarthria following cerebral infarction: Secondary | ICD-10-CM | POA: Diagnosis not present

## 2016-06-16 DIAGNOSIS — G510 Bell's palsy: Secondary | ICD-10-CM | POA: Diagnosis not present

## 2016-06-16 DIAGNOSIS — I4891 Unspecified atrial fibrillation: Secondary | ICD-10-CM | POA: Diagnosis not present

## 2016-06-16 DIAGNOSIS — Z8546 Personal history of malignant neoplasm of prostate: Secondary | ICD-10-CM | POA: Diagnosis not present

## 2016-06-16 DIAGNOSIS — I5032 Chronic diastolic (congestive) heart failure: Secondary | ICD-10-CM | POA: Diagnosis not present

## 2016-06-16 DIAGNOSIS — M179 Osteoarthritis of knee, unspecified: Secondary | ICD-10-CM | POA: Diagnosis not present

## 2016-06-20 DIAGNOSIS — E1140 Type 2 diabetes mellitus with diabetic neuropathy, unspecified: Secondary | ICD-10-CM | POA: Diagnosis not present

## 2016-06-20 DIAGNOSIS — I5032 Chronic diastolic (congestive) heart failure: Secondary | ICD-10-CM | POA: Diagnosis not present

## 2016-06-20 DIAGNOSIS — I110 Hypertensive heart disease with heart failure: Secondary | ICD-10-CM | POA: Diagnosis not present

## 2016-06-20 DIAGNOSIS — I69391 Dysphagia following cerebral infarction: Secondary | ICD-10-CM | POA: Diagnosis not present

## 2016-06-20 DIAGNOSIS — I8312 Varicose veins of left lower extremity with inflammation: Secondary | ICD-10-CM | POA: Diagnosis not present

## 2016-06-20 DIAGNOSIS — I8311 Varicose veins of right lower extremity with inflammation: Secondary | ICD-10-CM | POA: Diagnosis not present

## 2016-06-21 DIAGNOSIS — I8311 Varicose veins of right lower extremity with inflammation: Secondary | ICD-10-CM | POA: Diagnosis not present

## 2016-06-21 DIAGNOSIS — I69391 Dysphagia following cerebral infarction: Secondary | ICD-10-CM | POA: Diagnosis not present

## 2016-06-21 DIAGNOSIS — I5032 Chronic diastolic (congestive) heart failure: Secondary | ICD-10-CM | POA: Diagnosis not present

## 2016-06-21 DIAGNOSIS — I8312 Varicose veins of left lower extremity with inflammation: Secondary | ICD-10-CM | POA: Diagnosis not present

## 2016-06-21 DIAGNOSIS — I110 Hypertensive heart disease with heart failure: Secondary | ICD-10-CM | POA: Diagnosis not present

## 2016-06-21 DIAGNOSIS — E1140 Type 2 diabetes mellitus with diabetic neuropathy, unspecified: Secondary | ICD-10-CM | POA: Diagnosis not present

## 2016-06-26 DIAGNOSIS — E1140 Type 2 diabetes mellitus with diabetic neuropathy, unspecified: Secondary | ICD-10-CM | POA: Diagnosis not present

## 2016-06-26 DIAGNOSIS — I8312 Varicose veins of left lower extremity with inflammation: Secondary | ICD-10-CM | POA: Diagnosis not present

## 2016-06-26 DIAGNOSIS — I5032 Chronic diastolic (congestive) heart failure: Secondary | ICD-10-CM | POA: Diagnosis not present

## 2016-06-26 DIAGNOSIS — I110 Hypertensive heart disease with heart failure: Secondary | ICD-10-CM | POA: Diagnosis not present

## 2016-06-26 DIAGNOSIS — I8311 Varicose veins of right lower extremity with inflammation: Secondary | ICD-10-CM | POA: Diagnosis not present

## 2016-06-26 DIAGNOSIS — I69391 Dysphagia following cerebral infarction: Secondary | ICD-10-CM | POA: Diagnosis not present

## 2016-06-27 DIAGNOSIS — I8311 Varicose veins of right lower extremity with inflammation: Secondary | ICD-10-CM | POA: Diagnosis not present

## 2016-06-27 DIAGNOSIS — I5032 Chronic diastolic (congestive) heart failure: Secondary | ICD-10-CM | POA: Diagnosis not present

## 2016-06-27 DIAGNOSIS — I69391 Dysphagia following cerebral infarction: Secondary | ICD-10-CM | POA: Diagnosis not present

## 2016-06-27 DIAGNOSIS — E1140 Type 2 diabetes mellitus with diabetic neuropathy, unspecified: Secondary | ICD-10-CM | POA: Diagnosis not present

## 2016-06-27 DIAGNOSIS — I8312 Varicose veins of left lower extremity with inflammation: Secondary | ICD-10-CM | POA: Diagnosis not present

## 2016-06-27 DIAGNOSIS — I110 Hypertensive heart disease with heart failure: Secondary | ICD-10-CM | POA: Diagnosis not present

## 2016-06-29 DIAGNOSIS — I110 Hypertensive heart disease with heart failure: Secondary | ICD-10-CM | POA: Diagnosis not present

## 2016-06-29 DIAGNOSIS — I8312 Varicose veins of left lower extremity with inflammation: Secondary | ICD-10-CM | POA: Diagnosis not present

## 2016-06-29 DIAGNOSIS — I8311 Varicose veins of right lower extremity with inflammation: Secondary | ICD-10-CM | POA: Diagnosis not present

## 2016-06-29 DIAGNOSIS — E1140 Type 2 diabetes mellitus with diabetic neuropathy, unspecified: Secondary | ICD-10-CM | POA: Diagnosis not present

## 2016-06-29 DIAGNOSIS — I69391 Dysphagia following cerebral infarction: Secondary | ICD-10-CM | POA: Diagnosis not present

## 2016-06-29 DIAGNOSIS — I5032 Chronic diastolic (congestive) heart failure: Secondary | ICD-10-CM | POA: Diagnosis not present

## 2016-06-30 DIAGNOSIS — I69391 Dysphagia following cerebral infarction: Secondary | ICD-10-CM | POA: Diagnosis not present

## 2016-06-30 DIAGNOSIS — E1140 Type 2 diabetes mellitus with diabetic neuropathy, unspecified: Secondary | ICD-10-CM | POA: Diagnosis not present

## 2016-06-30 DIAGNOSIS — I110 Hypertensive heart disease with heart failure: Secondary | ICD-10-CM | POA: Diagnosis not present

## 2016-06-30 DIAGNOSIS — I8311 Varicose veins of right lower extremity with inflammation: Secondary | ICD-10-CM | POA: Diagnosis not present

## 2016-06-30 DIAGNOSIS — I5032 Chronic diastolic (congestive) heart failure: Secondary | ICD-10-CM | POA: Diagnosis not present

## 2016-06-30 DIAGNOSIS — I8312 Varicose veins of left lower extremity with inflammation: Secondary | ICD-10-CM | POA: Diagnosis not present

## 2016-07-04 DIAGNOSIS — I69391 Dysphagia following cerebral infarction: Secondary | ICD-10-CM | POA: Diagnosis not present

## 2016-07-04 DIAGNOSIS — E1140 Type 2 diabetes mellitus with diabetic neuropathy, unspecified: Secondary | ICD-10-CM | POA: Diagnosis not present

## 2016-07-04 DIAGNOSIS — I8312 Varicose veins of left lower extremity with inflammation: Secondary | ICD-10-CM | POA: Diagnosis not present

## 2016-07-04 DIAGNOSIS — I8311 Varicose veins of right lower extremity with inflammation: Secondary | ICD-10-CM | POA: Diagnosis not present

## 2016-07-04 DIAGNOSIS — I5032 Chronic diastolic (congestive) heart failure: Secondary | ICD-10-CM | POA: Diagnosis not present

## 2016-07-04 DIAGNOSIS — I110 Hypertensive heart disease with heart failure: Secondary | ICD-10-CM | POA: Diagnosis not present

## 2016-07-05 DIAGNOSIS — E1140 Type 2 diabetes mellitus with diabetic neuropathy, unspecified: Secondary | ICD-10-CM | POA: Diagnosis not present

## 2016-07-05 DIAGNOSIS — I69391 Dysphagia following cerebral infarction: Secondary | ICD-10-CM | POA: Diagnosis not present

## 2016-07-05 DIAGNOSIS — I110 Hypertensive heart disease with heart failure: Secondary | ICD-10-CM | POA: Diagnosis not present

## 2016-07-05 DIAGNOSIS — I5032 Chronic diastolic (congestive) heart failure: Secondary | ICD-10-CM | POA: Diagnosis not present

## 2016-07-05 DIAGNOSIS — I8311 Varicose veins of right lower extremity with inflammation: Secondary | ICD-10-CM | POA: Diagnosis not present

## 2016-07-05 DIAGNOSIS — I8312 Varicose veins of left lower extremity with inflammation: Secondary | ICD-10-CM | POA: Diagnosis not present

## 2016-07-07 DIAGNOSIS — E1165 Type 2 diabetes mellitus with hyperglycemia: Secondary | ICD-10-CM | POA: Diagnosis not present

## 2016-07-07 DIAGNOSIS — E1140 Type 2 diabetes mellitus with diabetic neuropathy, unspecified: Secondary | ICD-10-CM | POA: Diagnosis not present

## 2016-07-07 DIAGNOSIS — B351 Tinea unguium: Secondary | ICD-10-CM | POA: Diagnosis not present

## 2016-07-07 DIAGNOSIS — Z794 Long term (current) use of insulin: Secondary | ICD-10-CM | POA: Diagnosis not present

## 2016-07-07 DIAGNOSIS — F329 Major depressive disorder, single episode, unspecified: Secondary | ICD-10-CM | POA: Diagnosis not present

## 2016-07-07 DIAGNOSIS — B353 Tinea pedis: Secondary | ICD-10-CM | POA: Diagnosis not present

## 2016-07-07 DIAGNOSIS — E0840 Diabetes mellitus due to underlying condition with diabetic neuropathy, unspecified: Secondary | ICD-10-CM | POA: Diagnosis not present

## 2016-07-07 DIAGNOSIS — Z79899 Other long term (current) drug therapy: Secondary | ICD-10-CM | POA: Diagnosis not present

## 2016-07-07 DIAGNOSIS — I110 Hypertensive heart disease with heart failure: Secondary | ICD-10-CM | POA: Diagnosis not present

## 2016-07-07 DIAGNOSIS — I8311 Varicose veins of right lower extremity with inflammation: Secondary | ICD-10-CM | POA: Diagnosis not present

## 2016-07-07 DIAGNOSIS — I5032 Chronic diastolic (congestive) heart failure: Secondary | ICD-10-CM | POA: Diagnosis not present

## 2016-07-07 DIAGNOSIS — I8312 Varicose veins of left lower extremity with inflammation: Secondary | ICD-10-CM | POA: Diagnosis not present

## 2016-07-07 DIAGNOSIS — I69391 Dysphagia following cerebral infarction: Secondary | ICD-10-CM | POA: Diagnosis not present

## 2016-07-10 DIAGNOSIS — I8312 Varicose veins of left lower extremity with inflammation: Secondary | ICD-10-CM | POA: Diagnosis not present

## 2016-07-10 DIAGNOSIS — I110 Hypertensive heart disease with heart failure: Secondary | ICD-10-CM | POA: Diagnosis not present

## 2016-07-10 DIAGNOSIS — I69391 Dysphagia following cerebral infarction: Secondary | ICD-10-CM | POA: Diagnosis not present

## 2016-07-10 DIAGNOSIS — I5032 Chronic diastolic (congestive) heart failure: Secondary | ICD-10-CM | POA: Diagnosis not present

## 2016-07-10 DIAGNOSIS — E1140 Type 2 diabetes mellitus with diabetic neuropathy, unspecified: Secondary | ICD-10-CM | POA: Diagnosis not present

## 2016-07-10 DIAGNOSIS — I8311 Varicose veins of right lower extremity with inflammation: Secondary | ICD-10-CM | POA: Diagnosis not present

## 2016-07-12 DIAGNOSIS — I8311 Varicose veins of right lower extremity with inflammation: Secondary | ICD-10-CM | POA: Diagnosis not present

## 2016-07-12 DIAGNOSIS — E1140 Type 2 diabetes mellitus with diabetic neuropathy, unspecified: Secondary | ICD-10-CM | POA: Diagnosis not present

## 2016-07-12 DIAGNOSIS — I69391 Dysphagia following cerebral infarction: Secondary | ICD-10-CM | POA: Diagnosis not present

## 2016-07-12 DIAGNOSIS — I5032 Chronic diastolic (congestive) heart failure: Secondary | ICD-10-CM | POA: Diagnosis not present

## 2016-07-12 DIAGNOSIS — I8312 Varicose veins of left lower extremity with inflammation: Secondary | ICD-10-CM | POA: Diagnosis not present

## 2016-07-12 DIAGNOSIS — I110 Hypertensive heart disease with heart failure: Secondary | ICD-10-CM | POA: Diagnosis not present

## 2016-07-13 DIAGNOSIS — I8311 Varicose veins of right lower extremity with inflammation: Secondary | ICD-10-CM | POA: Diagnosis not present

## 2016-07-13 DIAGNOSIS — I110 Hypertensive heart disease with heart failure: Secondary | ICD-10-CM | POA: Diagnosis not present

## 2016-07-13 DIAGNOSIS — I69391 Dysphagia following cerebral infarction: Secondary | ICD-10-CM | POA: Diagnosis not present

## 2016-07-13 DIAGNOSIS — I8312 Varicose veins of left lower extremity with inflammation: Secondary | ICD-10-CM | POA: Diagnosis not present

## 2016-07-13 DIAGNOSIS — I5032 Chronic diastolic (congestive) heart failure: Secondary | ICD-10-CM | POA: Diagnosis not present

## 2016-07-13 DIAGNOSIS — E1140 Type 2 diabetes mellitus with diabetic neuropathy, unspecified: Secondary | ICD-10-CM | POA: Diagnosis not present

## 2016-07-20 DIAGNOSIS — I69391 Dysphagia following cerebral infarction: Secondary | ICD-10-CM | POA: Diagnosis not present

## 2016-07-20 DIAGNOSIS — E1140 Type 2 diabetes mellitus with diabetic neuropathy, unspecified: Secondary | ICD-10-CM | POA: Diagnosis not present

## 2016-07-20 DIAGNOSIS — I8312 Varicose veins of left lower extremity with inflammation: Secondary | ICD-10-CM | POA: Diagnosis not present

## 2016-07-20 DIAGNOSIS — I8311 Varicose veins of right lower extremity with inflammation: Secondary | ICD-10-CM | POA: Diagnosis not present

## 2016-07-20 DIAGNOSIS — I110 Hypertensive heart disease with heart failure: Secondary | ICD-10-CM | POA: Diagnosis not present

## 2016-07-20 DIAGNOSIS — I5032 Chronic diastolic (congestive) heart failure: Secondary | ICD-10-CM | POA: Diagnosis not present

## 2016-07-27 DIAGNOSIS — I8312 Varicose veins of left lower extremity with inflammation: Secondary | ICD-10-CM | POA: Diagnosis not present

## 2016-07-27 DIAGNOSIS — I110 Hypertensive heart disease with heart failure: Secondary | ICD-10-CM | POA: Diagnosis not present

## 2016-07-27 DIAGNOSIS — E1140 Type 2 diabetes mellitus with diabetic neuropathy, unspecified: Secondary | ICD-10-CM | POA: Diagnosis not present

## 2016-07-27 DIAGNOSIS — I5032 Chronic diastolic (congestive) heart failure: Secondary | ICD-10-CM | POA: Diagnosis not present

## 2016-07-27 DIAGNOSIS — I8311 Varicose veins of right lower extremity with inflammation: Secondary | ICD-10-CM | POA: Diagnosis not present

## 2016-07-27 DIAGNOSIS — I69391 Dysphagia following cerebral infarction: Secondary | ICD-10-CM | POA: Diagnosis not present

## 2016-08-01 DIAGNOSIS — I5032 Chronic diastolic (congestive) heart failure: Secondary | ICD-10-CM | POA: Diagnosis not present

## 2016-08-01 DIAGNOSIS — I110 Hypertensive heart disease with heart failure: Secondary | ICD-10-CM | POA: Diagnosis not present

## 2016-08-01 DIAGNOSIS — I8312 Varicose veins of left lower extremity with inflammation: Secondary | ICD-10-CM | POA: Diagnosis not present

## 2016-08-01 DIAGNOSIS — I8311 Varicose veins of right lower extremity with inflammation: Secondary | ICD-10-CM | POA: Diagnosis not present

## 2016-08-01 DIAGNOSIS — I69391 Dysphagia following cerebral infarction: Secondary | ICD-10-CM | POA: Diagnosis not present

## 2016-08-01 DIAGNOSIS — E1140 Type 2 diabetes mellitus with diabetic neuropathy, unspecified: Secondary | ICD-10-CM | POA: Diagnosis not present

## 2016-08-02 DIAGNOSIS — I8311 Varicose veins of right lower extremity with inflammation: Secondary | ICD-10-CM | POA: Diagnosis not present

## 2016-08-02 DIAGNOSIS — I8312 Varicose veins of left lower extremity with inflammation: Secondary | ICD-10-CM | POA: Diagnosis not present

## 2016-08-02 DIAGNOSIS — I5032 Chronic diastolic (congestive) heart failure: Secondary | ICD-10-CM | POA: Diagnosis not present

## 2016-08-02 DIAGNOSIS — I110 Hypertensive heart disease with heart failure: Secondary | ICD-10-CM | POA: Diagnosis not present

## 2016-08-02 DIAGNOSIS — I69391 Dysphagia following cerebral infarction: Secondary | ICD-10-CM | POA: Diagnosis not present

## 2016-08-02 DIAGNOSIS — E1140 Type 2 diabetes mellitus with diabetic neuropathy, unspecified: Secondary | ICD-10-CM | POA: Diagnosis not present

## 2016-08-03 DIAGNOSIS — Z794 Long term (current) use of insulin: Secondary | ICD-10-CM | POA: Diagnosis not present

## 2016-08-03 DIAGNOSIS — Z79899 Other long term (current) drug therapy: Secondary | ICD-10-CM | POA: Diagnosis not present

## 2016-08-03 DIAGNOSIS — H6120 Impacted cerumen, unspecified ear: Secondary | ICD-10-CM | POA: Diagnosis not present

## 2016-08-03 DIAGNOSIS — F329 Major depressive disorder, single episode, unspecified: Secondary | ICD-10-CM | POA: Diagnosis not present

## 2016-08-03 DIAGNOSIS — B353 Tinea pedis: Secondary | ICD-10-CM | POA: Diagnosis not present

## 2016-08-03 DIAGNOSIS — E1165 Type 2 diabetes mellitus with hyperglycemia: Secondary | ICD-10-CM | POA: Diagnosis not present

## 2016-08-03 DIAGNOSIS — I8311 Varicose veins of right lower extremity with inflammation: Secondary | ICD-10-CM | POA: Diagnosis not present

## 2016-08-03 DIAGNOSIS — Z23 Encounter for immunization: Secondary | ICD-10-CM | POA: Diagnosis not present

## 2016-08-03 DIAGNOSIS — E0840 Diabetes mellitus due to underlying condition with diabetic neuropathy, unspecified: Secondary | ICD-10-CM | POA: Diagnosis not present

## 2016-08-03 DIAGNOSIS — I5032 Chronic diastolic (congestive) heart failure: Secondary | ICD-10-CM | POA: Diagnosis not present

## 2016-08-03 DIAGNOSIS — B351 Tinea unguium: Secondary | ICD-10-CM | POA: Diagnosis not present

## 2016-08-03 DIAGNOSIS — M179 Osteoarthritis of knee, unspecified: Secondary | ICD-10-CM | POA: Diagnosis not present

## 2016-08-03 DIAGNOSIS — H6123 Impacted cerumen, bilateral: Secondary | ICD-10-CM | POA: Diagnosis not present

## 2016-08-08 DIAGNOSIS — I110 Hypertensive heart disease with heart failure: Secondary | ICD-10-CM | POA: Diagnosis not present

## 2016-08-08 DIAGNOSIS — I5032 Chronic diastolic (congestive) heart failure: Secondary | ICD-10-CM | POA: Diagnosis not present

## 2016-08-08 DIAGNOSIS — I8312 Varicose veins of left lower extremity with inflammation: Secondary | ICD-10-CM | POA: Diagnosis not present

## 2016-08-08 DIAGNOSIS — I69391 Dysphagia following cerebral infarction: Secondary | ICD-10-CM | POA: Diagnosis not present

## 2016-08-08 DIAGNOSIS — I8311 Varicose veins of right lower extremity with inflammation: Secondary | ICD-10-CM | POA: Diagnosis not present

## 2016-08-08 DIAGNOSIS — E1140 Type 2 diabetes mellitus with diabetic neuropathy, unspecified: Secondary | ICD-10-CM | POA: Diagnosis not present

## 2016-10-11 DIAGNOSIS — M179 Osteoarthritis of knee, unspecified: Secondary | ICD-10-CM | POA: Diagnosis not present

## 2016-10-11 DIAGNOSIS — E559 Vitamin D deficiency, unspecified: Secondary | ICD-10-CM | POA: Diagnosis not present

## 2016-10-11 DIAGNOSIS — Z23 Encounter for immunization: Secondary | ICD-10-CM | POA: Diagnosis not present

## 2016-10-11 DIAGNOSIS — B351 Tinea unguium: Secondary | ICD-10-CM | POA: Diagnosis not present

## 2016-10-11 DIAGNOSIS — E0840 Diabetes mellitus due to underlying condition with diabetic neuropathy, unspecified: Secondary | ICD-10-CM | POA: Diagnosis not present

## 2016-10-11 DIAGNOSIS — F322 Major depressive disorder, single episode, severe without psychotic features: Secondary | ICD-10-CM | POA: Diagnosis not present

## 2016-10-11 DIAGNOSIS — E1165 Type 2 diabetes mellitus with hyperglycemia: Secondary | ICD-10-CM | POA: Diagnosis not present

## 2016-10-11 DIAGNOSIS — I8311 Varicose veins of right lower extremity with inflammation: Secondary | ICD-10-CM | POA: Diagnosis not present

## 2016-10-11 DIAGNOSIS — Z794 Long term (current) use of insulin: Secondary | ICD-10-CM | POA: Diagnosis not present

## 2016-10-11 DIAGNOSIS — I5032 Chronic diastolic (congestive) heart failure: Secondary | ICD-10-CM | POA: Diagnosis not present

## 2016-10-11 DIAGNOSIS — B353 Tinea pedis: Secondary | ICD-10-CM | POA: Diagnosis not present

## 2016-10-11 DIAGNOSIS — Z79899 Other long term (current) drug therapy: Secondary | ICD-10-CM | POA: Diagnosis not present

## 2016-10-19 ENCOUNTER — Other Ambulatory Visit: Payer: Self-pay

## 2016-10-19 NOTE — Patient Outreach (Addendum)
[facility]) Care Management  10/19/2016  Jared Hall 06-May-1931 [id number]      Telephone Screen  Referral Date: 10/18/16 Referral Source: MD office(Dr. Gates)-URGENT-Major hospital risk Referral Reason: "HF, DM, HTN, chronic stasis dermatitis of right lower extremities, tinea pedis of feet, chronic leg edema, small decubitus on buttock"    Outreach attempt # 1 to patient. Spoke with patient and screening completed.   Social: Patient resides in his home alone. His son(Jared Hall) and dtr in law(Jared Hall) live nearby and assist with his care needs as needed. Patient states he has an aide thru [place] services that comes on Mondays and Thursdays to assist him with bathing. He voices that he does not drive anymore and relies on his family to take to MD appts. He reports one fall within the last year which was about eight months ago. DME in the home include cbg meter, walker and shower chair.   Conditions: Patient has PMH of DM with peripheral neuropathy, CHF, onychomycosis, HTN, CVA, urinary urgency/incontinence, CKD stage 3, Bell's palsy, chronic stasis dermatitis, tinea pedis of the feet, depression and OA. Patient reports that he is monitoring his blood sugars 2-3x/day. He voices that his blood sugars are always over 200. Last A1C was 9.2(10/11/16). He does not monitor BP in the home and unsure of what BP normally is. Patient reported he has a wound to his buttocks which he has had for about six months. He states that he is not able to reach wound and care for wound. He reports MD is aware that he is currently not bandaging and dressing wound. He states that he had [facility] care but was discharged about three weeks ago.    Medications: He reports taking 9 meds. Denies any issues with affording meds as he states most of them come through the [place] [facility]. He reports that his dtr in law helps him with keeping track and managing meds.    Appointments: He saw PCP on 10/11/16. He states  that he goes to the [place] in [place] yearly.    Advance Directives: None. Declined further info at this time.    Consent: [place] services reviewed and discussed. Patient gave verbal consent for [facility] services.  Plan: RN CM will notify [facility] administrative assistant of case status. RN CM will send [facility] community referral for further in home eval and assessment of care needs.  Enzo Montgomery, RN,BSN,CCM [place] Management Telephonic Care Management Coordinator Direct Phone: [phone] Toll Free: [phone] Fax: [phone]'

## 2016-10-23 ENCOUNTER — Other Ambulatory Visit: Payer: Self-pay | Admitting: *Deleted

## 2016-10-23 ENCOUNTER — Encounter: Payer: Self-pay | Admitting: *Deleted

## 2016-10-23 NOTE — Patient Outreach (Signed)
[facility]) Care Management [place] Telephone Outreach 10/23/2016  Jared Hall July 10, 1931 [id number]   Unsuccessful telephone outreach to [facility], 82 y/o male referred to [place] from [facility] telephonic RN CM, who received initial high risk referral from MD office.  Patient has history including, but not limited to CHF, DM, HTN, HLD, CVA, CKD, paroxysmal AF.  Today, I was unable to leave patient a voice mail message, as the phone rang without answer, and there was not a voice mail option.  Plan:  Will re-attempt [place] telephone outreach later this week.  [name], RN, BSN, [employer] [facility] Care Management  [phone]

## 2016-10-24 ENCOUNTER — Encounter: Payer: Self-pay | Admitting: *Deleted

## 2016-10-24 ENCOUNTER — Other Ambulatory Visit: Payer: Self-pay | Admitting: *Deleted

## 2016-10-24 NOTE — Patient Outreach (Signed)
[facility]) Care Management [place] Telephone Outreach 10/24/2016  Jared Hall Jul 01, 1931 [id number]  Successful telephone outreach to Jared Hall, 82 y/o male referred to [place] from [facility] telephonic RN Hall, who received initial high risk referral from MD office.  Patient has history including, but not limited to CHF, DM, HTN, HLD, CVA, CKD, paroxysmal AF.  HIPAA/ identity verified with patient today during phone call, and verbal consent for [place] services obtained.  Today, [name] reports that he is "doing pretty good."   -- states biggest health issue is "getting blood sugar under control."  Reports checks blood sugars 2-3 times every day, does not record blood sugar readings.  Agrees to begin recording blood sugars for review at [place] in-home visit.  -- takes all medications as prescribed, states he has "given myself insulin shots for the last 5 years."  Self-manages medications.  -- falls:  Reports history of falls; uses rolling walker 100% of the time for ambulation, denies recent falls.  -- attended PCP visit 10/11/16; reports "no changes" were made during visit.  -- [employer] needs:  Patient lives alone, reports supportive family, son and daughter-in-law, whom live close by.  Reports that he has a private duty care assistant that helps him with bathing one time per week.  Currently denies community resource needs, states that he prepares his own meals most of the time, and that his daughter in law also assists with meals; reports son and daughter in law transport him to all provider appointments.  -- skin integrity issues:  Reports that he has a "sore that is finally healing," on his "backside."  Patient reports that he also gets "blood blisters" on his legs.  [name] denies further concerns, issues, problems, or needs today.  I provided patient with my direct phone number, the number to the 24-hour nurse line, and the  main number for Jared Hall, and we scheduled initial [facility] Hall home visit for next week.  Plan:  [name] will continue to take his medications as they are prescribed and will attend all provider appointments.   [name] will continue to monitor his blood sugars 2-3 daily, and will begin recording these values.   Mr. Blocker will continue to use his walker for assistance with ambulation.   [name] will contact his providers for any concerns, needs, issues, or problems that arise.  I will make patient's provider aware of [facility] involvement in patient's care.   [place] outreach to continue with scheduled initial home visit next week.   Jared Rack, RN, BSN, [employer] [facility] Care Management  [phone]

## 2016-10-25 ENCOUNTER — Encounter: Payer: Self-pay | Admitting: *Deleted

## 2016-11-02 ENCOUNTER — Other Ambulatory Visit: Payer: Self-pay | Admitting: *Deleted

## 2016-11-02 ENCOUNTER — Encounter: Payer: Self-pay | Admitting: *Deleted

## 2016-11-02 NOTE — Patient Outreach (Signed)
[facility]) Care Management  [place] Initial Home Visit 11/02/2016  Jared Hall 12-21-1930 [id number]   Jared Hall is an 82 y.o. male referred to [place] from [facility] telephonic RN CM, who received initial high risk referral from MD office. Patient has history including, but not limited to CHF, DM, HTN, HLD, CVA, CKD, paroxysmal AF.  HIPAA/ identity verified with patient today in person, and written consent for [facility] CM services obtained.  Patient's daughter-in-law and caregiver, Jared Hall, is present at patient's home for today's visit.  Today, [name] again reports that he is "doing pretty good."   -- Self-health management of chronic disease state of DM:  Reports checks blood sugars 2-3 times every day, does not record blood sugar readings, and did not begin recording blood sugars after last [place] outreach.  Agrees to begin recording blood sugars for review at [place] in-home visit.  Provided [facility] CM calendar booklet with explanation of how to record blood sugars.  Reports general blood sugar range "usually between 150-300," stating today's post-prandial blood sugar of "121."  Patient prepares and administers his own insulin, and states that he "takes insulin 2 or 3 times every day, depending on blood sugar readings."  A1-C on 10/11/16 (PCP OV):  9.2  -- Self-health management of chronic disease state of CHF:  Patient and caregiver/ daughter-in-law Jared Hall, report that they are not aware that patient has diagnosis of CHF.  Reviewed documentation present in EMR with both, and provided education/ explanation of chronic disease state of CHF.  Patient reports that he does not regularly weigh himself, but states that he does have scales in his home.  Patient weighed himself today with assistance, due to limited mobility/ inability to stand without assistance, and his weight today on home scales was 278 pounds.  Patient reports that he believes his  general weight range is "between 170-180 pounds."  Patient/ caregiver Jared Hall agree to begin monitoring/ recording weights 1-2 times per week.  -- Medications:  Reports has and takes all medications as prescribed, self-manages medications using pill box.  Patient's PCP is not part of EMR, and faxed note from PCP office visit on 10/11/16 were thoroughly reviewed with patient and caregiver and compared to actual patient reporting/ review of medications present in home.  Patient and caregiver verbalize a good general understanding of the purpose, dosing, and scheduling of his medications.  Medication reconciliation was performed today in person at patient's home, medication list in EMR updated to reflect PCP office visit notes from 10/11/16 and patient reporting today.  Medication list in EMR updated to reflect current medications as reported by patient and reviewed from PCP office note from 10/11/16.  See medication list for specific details.  -- General safety/ falls:  Reports history of falls, with last fall "2 years ago."  Denies recent falls.  Uses rolling walker 100% of the time for ambulation.  Patient demonstrates slow but steady, purposeful ambulation with use of rolling walker.  Caregiver Jared Hall reports patient has Life-Alert necklace that he wears at all times.  -- Provider appointments:  Patient has attended all scheduled provider appointments and verbalizes commitment to regular follow up with PCP.  Next scheduled PCP office visit:  March 2018.  -- Community resource needs:  Patient lives alone, reports supportive family, son and daughter-in-law, whom live close by.  Reports that he has a private duty care assistant that helps him with bathing/ housekeeping one time per  week.  Currently denies community resource needs, states that he prepares his own meals most of the time, and that his daughter in law also assists with meals; reports son and daughter in law transport him to all provider appointments.   Patient stated that he "looked into" getting Meals on Wheels in the past, but stated that the wait list was "too long."  Patient denied offer for assistance to re-apply for Meals on Wheels, stating that he "has plenty to eat," and no longer feels this is necessary.  -- Advanced Directive Planning:  Patient had previously reported that he had a living will in place, however, today, caregiver/ daughter-in-law Jared Hall states that what patient has is "a regular will."  Extensive education around Advanced Directive Planning with information packet provided to both patient and caregiver.  -- skin integrity issues:  Patient previously reported that he has a "sore that has finally healed" on his "backside."  Patient reports that PCP viewed his "sore" during office visit 10/11/16 and was told it was "looking good and had healed."  Patient reports that he cared for wound by "keeping clean and dry as possible," states he believes it has healed as PCP verified 10/11/16, "because it doesn't hurt anymore."  Patient had home health [facility]) nurse coming in to manage wound, reports HH has been done "for several weeks now."  Reports that he wears depends "all the time" due to urine leaking from diuretics.  Nutritional strategies/ protein shake supplemenation for promotion of skin integrity discussed with patient and caregiver, encouraged patient to keep area clean and dry.   [name] denies further concerns, issues, problems, or needs today.  I confirmed that patient/ daughter-in-law had my direct phone number, the number to the 24-hour nurse line, and the main number for [facility] CM, and we scheduled next in-home [place] home visit for next month.   Subjective: "Overall, I think I am doing okay here at home.  My family is real good to me, and they help me with everything I need."  Objective:   BP 138/64   Pulse 84   Resp 16   Wt 278 lb (126.1 kg)   SpO2 93%   BMI 38.77 kg/m     Review of Systems   Constitutional: Positive for malaise/fatigue.  Respiratory: Positive for shortness of breath. Negative for cough, sputum production and wheezing.        SOB with activity; recovers quickly with rest  Cardiovascular: Positive for leg swelling. Negative for chest pain.       +2-3 bilateral LE swelling and erythema  Gastrointestinal: Negative for abdominal pain, constipation and nausea.  Genitourinary: Positive for frequency and urgency.       Patient attributes to diuretic therapy; wears depends  Musculoskeletal: Positive for myalgias. Negative for falls.  Neurological: Negative for dizziness.  Psychiatric/Behavioral: Negative for depression. The patient is not nervous/anxious.     Physical Exam  Constitutional: He is oriented to person, place, and time. He appears well-developed and well-nourished. No distress.  Cardiovascular: Normal rate, regular rhythm and intact distal pulses.   Pulses:      Radial pulses are 2+ on the right side, and 2+ on the left side.  Unable to definitively palpate bilateral DP's secondary to LE edema  Respiratory: Effort normal and breath sounds normal. No respiratory distress. He has no wheezes. He has no rales.  GI: Soft. Bowel sounds are normal.  Musculoskeletal: He exhibits edema.  See ROS  Neurological: He is alert  and oriented to person, place, and time.  Skin: Skin is warm and dry. There is erythema.  See ROS  Psychiatric: He has a normal mood and affect. His behavior is normal. Judgment and thought content normal.    Encounter Medications:   Outpatient Encounter Prescriptions as of 11/02/2016  Medication Sig Note  . apixaban (ELIQUIS) 5 MG TABS tablet Take 1 tablet (5 mg total) by mouth 2 (two) times daily. 11/02/2016: Per patient/ daughter-in-law report, this was discontinued; now on Clopidogrel 75 mg po QD  . B Complex Vitamins (VITAMIN B COMPLEX PO) Take 1 tablet by mouth daily.   [name] Kitchen docusate sodium (COLACE) 100 MG capsule Take 1 capsule (100 mg  total) by mouth 2 (two) times daily. 11/02/2016: Takes as needed  . fluticasone (FLONASE) 50 MCG/ACT nasal spray Place 1 spray into both nostrils daily.   . furosemide (LASIX) 80 MG tablet Take 40 mg by mouth daily. 11/02/2016: Takes 80 mg q AM and 40 mg q PM  . HYDROcodone-acetaminophen (NORCO/VICODIN) 5-325 MG per tablet Take two tablets by mouth every 4 hours as needed for pain. Do not exceed 4gm of Tylenol in 24 hours   . insulin glargine (LANTUS) 100 UNIT/ML injection Inject 0.4 mLs (40 Units total) into the skin 2 (two) times daily. 11/02/2016: Patient states this was increased by Dr. Inda Merlin; taking 80 U BID; states occasionally takes mid-day dose if blood glucose is high  . metoprolol tartrate (LOPRESSOR) 25 MG tablet Take 1 tablet (25 mg total) by mouth 2 (two) times daily.   . potassium chloride SA (K-DUR,KLOR-CON) 20 MEQ tablet Take 20 mEq by mouth daily.   [name] Kitchen terazosin (HYTRIN) 2 MG capsule Take 4 mg by mouth daily.   . vitamin B-12 (CYANOCOBALAMIN) 1000 MCG tablet Take 1,000 mcg by mouth daily.   . predniSONE (STERAPRED UNI-PAK 21 TAB) 10 MG (21) TBPK tablet Take 60 mg PO q daily x 5 days then stop (Patient not taking: Reported on 11/02/2016) 11/02/2016: Completed dose as instructed  . pregabalin (LYRICA) 50 MG capsule Take 1 capsule (50 mg total) by mouth 2 (two) times daily. (Patient not taking: Reported on 11/02/2016)   . valACYclovir (VALTREX) 1000 MG tablet Take 1 tablet (1,000 mg total) by mouth 3 (three) times daily. (Patient not taking: Reported on 11/02/2016)   . zolpidem (AMBIEN) 5 MG tablet Take 1 tablet (5 mg total) by mouth at bedtime as needed for sleep. (Patient not taking: Reported on 11/02/2016)    No facility-administered encounter medications on file as of 11/02/2016.     Functional Status:   In your present state of health, do you have any difficulty performing the following activities: 11/02/2016 10/24/2016  Hearing? - Y  Vision? - N  Difficulty concentrating or  making decisions? - N  Walking or climbing stairs? - Y  Dressing or bathing? - Y  Doing errands, shopping? - Y  Preparing Food and eating ? N -  Using the Toilet? N -  In the past six months, have you accidently leaked urine? Y -  Do you have problems with loss of bowel control? N -  Managing your Medications? N -  Managing your Finances? N -  Housekeeping or managing your Housekeeping? Y -  Some recent data might be hidden    Fall/Depression Screening:    PHQ 2/9 Scores 11/02/2016 10/19/2016  PHQ - 2 Score 0 0    Assessment:  Mr. Lavorgna has a supportive family that assist him with his care  needs and voices no community resource needs today.  Mr. Miedema has a good understanding of and is compliant with his medication regimen but could benefit from additional education around Advanced Directive planning and self-health management of chronic disease states of DM and CHF.  Mr. Ikerd is committed to following his established plan of care and attending scheduled provider appointments.   Plan:   Mr. Gaede will continue to take his medications as they are prescribed and will attend all provider appointments.  Mr. Seidl will continue to monitor his blood sugars 2-3 daily, and will begin recording these values.  Mr. Nuzzi will begin monitoring and recording his weights one- two times per week.  Mr. Holtzclaw will continue to use his walker for assistance with ambulation.  Mr. Desta and his caregiver/ daughter-in-law will review Advanced Directive planning packet provided to them today.  Mr. Hartke will contact his providers for any concerns, needs, issues, or problems that arise.  I will share notes from today's [facility] CM initial home visit with patient with PCP.  [place] outreach to continue with scheduled initial home visit next month.  [name], RN, BSN, [employer] [facility] Care Management  [phone]

## 2016-11-27 ENCOUNTER — Encounter: Payer: Self-pay | Admitting: *Deleted

## 2016-11-27 ENCOUNTER — Other Ambulatory Visit: Payer: Self-pay | Admitting: *Deleted

## 2016-11-27 NOTE — Patient Outreach (Signed)
[facility]) Care Management [place] Telephone Outreach 11/27/2016  Jared Hall 04-19-1931 [id number]  Successful telephone outreach to [facility], daughter-in-law and caregiver on [facility] written consent, for Jared Hall is an 83 y.o. male referred to [place] from [facility] telephonic RN CM, who received initial high risk referral from MD office. Patient has history including, but not limited to CHF, DM, HTN, HLD, CVA, CKD, paroxysmal AF. HIPAA/ identity verified by phone with patient's daughter-in-law and caregiver, [name], as patient is very hard of hearing, and previously requested that telephone communication go through [place].  Today, I received voicemail message from [place] asking for return call back, and successfully connected with her within an hour of original call.  [name] reports today that patient was visited by his private duty care assistant this morning, who in turn notified [name] that patient had "an open blister" on the back of his leg that was not present when care assistant saw patient last week.  [name] states that personal caregiver did not provide further details on wound appearance, and only stated, "it is an open blister."  [name] states that she and her husband (patient's son) visited with patient on Friday of last week and have been keeping in touch with him by phone over this past weekend; [name] reports that patient "seems fine-- he hasn't seemed sick and has not been complaining of pain or feeling bad."  [name] states that she has been sick "with a stomach bug," and is unable to go see patient today, as she does not want to expose him to her recent illness.  [name] states that her husband, patient's son, "is going to see him now," and she agrees to contact me if patient appears to be ill or in any distress today.  We discussed reasons (fever, decreased level of consciousness, confusion, respiratory distress) that patient should seek provider health care urgently/  emergently and [name] verbalizes understanding.  We cancelled previously scheduled [facility] CM routine home visit for Thursday November 30, 2016 and re-scheduled visit for tomorrow.     [name] deniesfurther concerns, issues, problems, or needs today.   Plan:  [place] Routine Home Visit tomorrow  [name], RN, BSN, [place] Coordinator [facility] Care Management  [phone]

## 2016-11-28 ENCOUNTER — Encounter: Payer: Self-pay | Admitting: *Deleted

## 2016-11-28 ENCOUNTER — Ambulatory Visit: Payer: Self-pay | Admitting: *Deleted

## 2016-11-28 ENCOUNTER — Emergency Department (HOSPITAL_COMMUNITY): Payer: Medicare Other

## 2016-11-28 ENCOUNTER — Other Ambulatory Visit: Payer: Self-pay | Admitting: *Deleted

## 2016-11-28 ENCOUNTER — Encounter (HOSPITAL_COMMUNITY): Payer: Self-pay

## 2016-11-28 ENCOUNTER — Emergency Department (HOSPITAL_COMMUNITY)
Admission: EM | Admit: 2016-11-28 | Discharge: 2016-11-28 | Disposition: A | Discharge status: Home / Self Care | Payer: Medicare Other | Attending: Emergency Medicine | Admitting: Emergency Medicine

## 2016-11-28 DIAGNOSIS — I110 Hypertensive heart disease with heart failure: Secondary | ICD-10-CM | POA: Diagnosis not present

## 2016-11-28 DIAGNOSIS — Z8673 Personal history of transient ischemic attack (TIA), and cerebral infarction without residual deficits: Secondary | ICD-10-CM | POA: Insufficient documentation

## 2016-11-28 DIAGNOSIS — R0602 Shortness of breath: Secondary | ICD-10-CM | POA: Diagnosis not present

## 2016-11-28 DIAGNOSIS — Z7901 Long term (current) use of anticoagulants: Secondary | ICD-10-CM | POA: Diagnosis not present

## 2016-11-28 DIAGNOSIS — Y939 Activity, unspecified: Secondary | ICD-10-CM | POA: Insufficient documentation

## 2016-11-28 DIAGNOSIS — E109 Type 1 diabetes mellitus without complications: Secondary | ICD-10-CM | POA: Insufficient documentation

## 2016-11-28 DIAGNOSIS — S90121A Contusion of right lesser toe(s) without damage to nail, initial encounter: Secondary | ICD-10-CM | POA: Insufficient documentation

## 2016-11-28 DIAGNOSIS — S99921A Unspecified injury of right foot, initial encounter: Secondary | ICD-10-CM | POA: Diagnosis present

## 2016-11-28 DIAGNOSIS — X58XXXA Exposure to other specified factors, initial encounter: Secondary | ICD-10-CM | POA: Diagnosis not present

## 2016-11-28 DIAGNOSIS — I509 Heart failure, unspecified: Secondary | ICD-10-CM | POA: Diagnosis not present

## 2016-11-28 DIAGNOSIS — L03119 Cellulitis of unspecified part of limb: Secondary | ICD-10-CM | POA: Diagnosis not present

## 2016-11-28 DIAGNOSIS — Y929 Unspecified place or not applicable: Secondary | ICD-10-CM | POA: Insufficient documentation

## 2016-11-28 DIAGNOSIS — R69 Illness, unspecified: Secondary | ICD-10-CM | POA: Diagnosis not present

## 2016-11-28 DIAGNOSIS — Z7982 Long term (current) use of aspirin: Secondary | ICD-10-CM | POA: Diagnosis not present

## 2016-11-28 DIAGNOSIS — Z8546 Personal history of malignant neoplasm of prostate: Secondary | ICD-10-CM | POA: Insufficient documentation

## 2016-11-28 DIAGNOSIS — M7989 Other specified soft tissue disorders: Secondary | ICD-10-CM | POA: Diagnosis not present

## 2016-11-28 DIAGNOSIS — Y999 Unspecified external cause status: Secondary | ICD-10-CM | POA: Insufficient documentation

## 2016-11-28 DIAGNOSIS — R531 Weakness: Secondary | ICD-10-CM | POA: Diagnosis not present

## 2016-11-28 LAB — BASIC METABOLIC PANEL
ANION GAP: 7 (ref 5–15)
BUN: 22 mg/dL — AB (ref 6–20)
CHLORIDE: 104 mmol/L (ref 101–111)
CO2: 31 mmol/L (ref 22–32)
Calcium: 9.3 mg/dL (ref 8.9–10.3)
Creatinine, Ser: 1.58 mg/dL — ABNORMAL HIGH (ref 0.61–1.24)
GFR calc Af Amer: 45 mL/min — ABNORMAL LOW (ref >60)
GFR, EST NON AFRICAN AMERICAN: 39 mL/min — AB (ref >60)
GLUCOSE: 189 mg/dL — AB (ref 65–99)
POTASSIUM: 3.8 mmol/L (ref 3.5–5.1)
Sodium: 142 mmol/L (ref 135–145)

## 2016-11-28 LAB — I-STAT TROPONIN, ED: TROPONIN I, POC: 0 ng/mL (ref 0.00–0.08)

## 2016-11-28 LAB — CBC
HEMATOCRIT: 39.2 % (ref 39.0–52.0)
HEMOGLOBIN: 12 g/dL — AB (ref 13.0–17.0)
MCH: 21.2 pg — AB (ref 26.0–34.0)
MCHC: 30.6 g/dL (ref 30.0–36.0)
MCV: 69.4 fL — AB (ref 78.0–100.0)
PLATELETS: 251 10*3/uL (ref 150–400)
RBC: 5.65 MIL/uL (ref 4.22–5.81)
RDW: 16 % — ABNORMAL HIGH (ref 11.5–15.5)
WBC: 10.3 10*3/uL (ref 4.0–10.5)

## 2016-11-28 LAB — SEDIMENTATION RATE: SED RATE: 18 mm/h — AB (ref 0–16)

## 2016-11-28 LAB — C-REACTIVE PROTEIN: CRP: 0.9 mg/dL (ref <1.0)

## 2016-11-28 LAB — BRAIN NATRIURETIC PEPTIDE: B Natriuretic Peptide: 31.2 pg/mL (ref 0.0–100.0)

## 2016-11-28 MED ORDER — FUROSEMIDE 40 MG PO TABS: 40.0000 mg | ORAL_TABLET | Freq: Two times a day (BID) | ORAL | 0 refills | Status: DC

## 2016-11-28 MED ORDER — FUROSEMIDE 10 MG/ML IJ SOLN
40.0000 mg | Freq: Once | INTRAMUSCULAR | Status: AC
  Administered 2016-11-28: 40 mg via INTRAVENOUS
  Filled 2016-11-28: qty 4

## 2016-11-28 NOTE — Patient Outreach (Signed)
[facility]) Care Management  [place] Acute Home Visit 11/28/2016  Jared Hall 04/13/1931 [id number]  Jared Hall is an 83 y.o. male referred to [place] from [facility] telephonic RN CM, who received initial high risk referral from MD office. Patient has history including, but not limited to CHF, DM, HTN, HLD, CVA, CKD, paroxysmal AF. HIPAA/ identity verified with patient today in person.  Acute in-home visit this afternoon was initiated by patient's caregiver, and daughter-in-law, [name], on [facility] written consent.  [name] had called yesterday to report that patient had developed "wet open sores" on his legs. We had previously scheduled [name] CM routine home visit for Thursday, November 30, 2016, and this visit was re-scheduled for today, as [name] was sick yesterday and could not be present to attend visit.  Today, [name] was still feeling sick and acute home visit today was with patient and myself only.  Today, patient reports that his private duty hygiene care assistant noticed yesterday that his "legs are oozing and have blisters," although patient states that he "feels pretty much normal."  See ROS and PE for additional assessment details.  -- Self-health management of chronic disease state of CHF:  Patient reports that he has continued to be unable to regularly weigh himself, as he is unable to stand without holding on to something without risking falling.  Patient has 2 recorded weights since mid- December 2017, but reliability is questionable, as we attempted to weigh patient today, but he is too unsteady to obtain an accurate weight.  -- SOB at baseline from previous [place] in-home visit; patient denies increased SOB/ difficulty breathing and is no acute distress during visit.  -- Bilateral LE edema/ erythema noted, see ROS/ PE.  Patient has supplies from previous [facility] services, and bilateral LE were wrapped today using unna boot supplies present in his home;  patient stated legs "feel much better" after wrapping completed.  -- (R) lateral little (5th) toe has area of 1-2 cm that appears to be necrotic tissue.  -- EMMI educational materials provided to patient regarding self-health management of CHF/ cellulitis, thoroughly discussed with patient during visit.  -- Self-health management of chronic disease state of DM: Reports has been monitoring and recording blood sugars 2-3 times every day since last [place] in home visit; recorded blood sugars were reviewed with patient today, and are in a range from 133-335 since November 02, 2016.  Patient prepares and administers his own insulin, and states that he "takes insulin 2 or 3 times every day, depending on blood sugar readings."  A1-C on 10/11/16 (PCP OV):  9.2  -- Medications:  Reports has and takes all medications as prescribed, self-manages medications using pill box.  Patient verbalizes a good general understanding of the purpose, dosing, and scheduling of his medications.  Medication reconciliation was performed today in person with medications present at his home.  -- Provider appointments:  Patient currently has upcoming PCP provider appointment scheduled for January 18, 2017, per report of daughter-in-law/ caregiver [name].  After acute visit with patient today, I contacted caregiver/ daughter-in-law [name] and updated her on all of my assessment findings/ interventions.  I communicated plans to [facility] to contact Dr. Inda Merlin, and she requested that Dr. Inda Merlin contact her for follow up plan for ongoing patient care.  [name] and I agreed to communicate again within one week or sooner depending on Dr. Inda Merlin follow up plan to schedule next [place] in-home visit.  I then contacted Dr. Inda Merlin office and left detailed voice message for Katrina, nurse, requesting call back regarding today's acute in-home visit with patient.  Subjective: "I'm feeling okay, but I know my legs are looking bad."   Objective:     BP 124/60   Pulse 82   Resp 20   SpO2 93%    Review of Systems  Constitutional: Negative.  Negative for chills, fever and malaise/fatigue.  Respiratory: Positive for shortness of breath. Negative for cough and wheezing.        Patient is short of breath with activity of ambulation around his home.  SaO2 levels on RA 88% with ambulation; increases to 93% when at rest after walking.  Patient reports SOB is at his baseline; denies difficulty breathing  Cardiovascular: Positive for orthopnea and leg swelling. Negative for chest pain and palpitations.       Bilateral LE erythema and swelling; Bilateral feet, ankles, and lower legs are swollen +3-4 with (R) > (L).  Bilateral LE have areas of blistering, some of which are open and oozing    Gastrointestinal: Negative for abdominal pain and nausea.  Genitourinary: Positive for frequency and urgency.       Patient attributes to diuretic therapy  Musculoskeletal: Negative for falls and myalgias.  Skin: Negative for rash.       Bilateral LE erythema and swelling; Bilateral feet, ankles, and lower legs are swollen +3-4 with (R) > (L).  Bilateral LE have areas of blistering, some of which are open and oozing.  Approximate 1-2 cm in diameter area on lateral (R) little (5th) toe is black, and appears necrotic; patient is unsure how long his toe has looked this way and denies pain at site.   Neurological: Negative.  Negative for weakness.  Psychiatric/Behavioral: Negative for depression. The patient is not nervous/anxious.     Physical Exam  Constitutional: He is oriented to person, place, and time. He appears well-developed and well-nourished. No distress.    Cardiovascular: Normal rate, regular rhythm and intact distal pulses.   Bilateral LE erythema and swelling; Bilateral feet, ankles, and lower legs are swollen +3-4 with (R) > (L).  Bilateral LE have areas of blistering, some of which are open and oozing  Distant heart sounds; unable to  definitively palpate DP bilaterally secondary to edema  Respiratory: No respiratory distress. He has no wheezes. He has no rales.  Breath sounds coarse but essentially clear; patient denies increased SOB above baseline   GI: Soft. Bowel sounds are normal.  Musculoskeletal: He exhibits edema.  Bilateral LE erythema and swelling; Bilateral feet, ankles, and lower legs are swollen +3-4 with (R) > (L).  Bilateral LE have areas of blistering, some of which are open and oozing    Neurological: He is alert and oriented to person, place, and time.  Skin: Skin is warm and dry. There is erythema.  Psychiatric: He has a normal mood and affect. His behavior is normal. Judgment and thought content normal.    Encounter Medications:   Outpatient Encounter Prescriptions as of 11/28/2016  Medication Sig Note  . aspirin EC 81 MG tablet Take 81 mg by mouth daily.   . B Complex Vitamins (VITAMIN B COMPLEX PO) Take 1 tablet by mouth daily.   [name] Kitchen buPROPion (WELLBUTRIN SR) 150 MG 12 hr tablet Take 150 mg by mouth 2 (two) times daily.   . clopidogrel (PLAVIX) 75 MG tablet Take 75 mg by mouth daily.   [name] Kitchen docusate sodium (COLACE) 100 MG capsule  Take 1 capsule (100 mg total) by mouth 2 (two) times daily. 11/28/2016: Takes as needed   . fluticasone (FLONASE) 50 MCG/ACT nasal spray Place 1 spray into both nostrils daily.   . furosemide (LASIX) 80 MG tablet Take 40 mg by mouth daily. 11/02/2016: Takes 80 mg q AM and 40 mg q PM  . gabapentin (NEURONTIN) 300 MG capsule Take 300 mg by mouth 3 (three) times daily.   [name] Kitchen HYDROcodone-acetaminophen (NORCO/VICODIN) 5-325 MG per tablet Take two tablets by mouth every 4 hours as needed for pain. Do not exceed 4gm of Tylenol in 24 hours   . insulin glargine (LANTUS) 100 UNIT/ML injection Inject 0.4 mLs (40 Units total) into the skin 2 (two) times daily. 11/28/2016: Patient states taking 80 U BID per order of Dr. Inda Merlin   . metoprolol tartrate (LOPRESSOR) 25 MG tablet Take 1 tablet (25 mg  total) by mouth 2 (two) times daily.   . pantoprazole (PROTONIX) 40 MG tablet Take 40 mg by mouth daily.   . potassium chloride SA (K-DUR,KLOR-CON) 20 MEQ tablet Take 20 mEq by mouth daily.   . simvastatin (ZOCOR) 20 MG tablet Take 20 mg by mouth daily.   [name] Kitchen terazosin (HYTRIN) 2 MG capsule Take 4 mg by mouth daily.   . vitamin B-12 (CYANOCOBALAMIN) 1000 MCG tablet Take 1,000 mcg by mouth daily.   [name] Kitchen apixaban (ELIQUIS) 5 MG TABS tablet Take 1 tablet (5 mg total) by mouth 2 (two) times daily. (Patient not taking: Reported on 11/28/2016) 11/02/2016: Per patient/ daughter-in-law report, this was discontinued; now on Clopidogrel 75 mg po QD  . predniSONE (STERAPRED UNI-PAK 21 TAB) 10 MG (21) TBPK tablet Take 60 mg PO q daily x 5 days then stop (Patient not taking: Reported on 11/28/2016) 11/02/2016: Completed dose as instructed  . pregabalin (LYRICA) 50 MG capsule Take 1 capsule (50 mg total) by mouth 2 (two) times daily. (Patient not taking: Reported on 11/28/2016)   . valACYclovir (VALTREX) 1000 MG tablet Take 1 tablet (1,000 mg total) by mouth 3 (three) times daily. (Patient not taking: Reported on 11/28/2016)   . zolpidem (AMBIEN) 5 MG tablet Take 1 tablet (5 mg total) by mouth at bedtime as needed for sleep. (Patient not taking: Reported on 11/28/2016)    No facility-administered encounter medications on file as of 11/28/2016.     Functional Status:   In your present state of health, do you have any difficulty performing the following activities: 11/02/2016 10/24/2016  Hearing? - Y  Vision? - N  Difficulty concentrating or making decisions? - N  Walking or climbing stairs? - Y  Dressing or bathing? - Y  Doing errands, shopping? - Y  Preparing Food and eating ? N -  Using the Toilet? N -  In the past six months, have you accidently leaked urine? Y -  Do you have problems with loss of bowel control? N -  Managing your Medications? N -  Managing your Finances? N -  Housekeeping or managing your  Housekeeping? Y -  Some recent data might be hidden    Fall/Depression Screening:    PHQ 2/9 Scores 11/02/2016 10/19/2016  PHQ - 2 Score 0 0    Assessment:  Although Mr. Augsburger is in no acute distress above his baseline today, he appears to have possible new cellulitis of his bilateral LE, and has an area on his (R) lateral 5th toe that appears to be new necrosis which should be addressed by his provider for follow up  plan of care/ instructions.  Mr. Albini has a good understanding of and is compliant with his medication regimen and could use additional education around self-health management of chronic disease states of DM and CHF.  Mr. Marra is committed to following his established plan of care and attending scheduled provider appointments.   Plan:   Mr. Schiess caregiver/ daughter-in-law will communicate with patient's PCP regarding follow up plan for new assessment findings today; if [name] does not hear back from Dr. Inda Merlin by tomorrow, she will contact [facility] RN CM directly.  Mr. Boerman continue to take his medications as they are prescribed and will attend all provider appointments.  Mr. Laso continue to monitor his blood sugars 2-3daily, and will continue recording these values.  Mr. Heimbuch continue to use his walker for assistance with ambulation.  Mr. Dowty contact his providers for any concerns, needs, issues, or problems that arise.  I will contact patient's PCP regarding new assessment findings from today's acute in-home visit and share notes via secure messaging with PCP.  [place] outreach to continue with scheduled follow up telephone outreach to caregiver [name] within one week.  [name], RN, BSN, [employer] [facility] Care Management  [phone]

## 2016-11-28 NOTE — ED Notes (Signed)
Pt states he is here because home health told him he needed to come to the ED. Pt states he has a black spot on the tip of his right pinky toe. Pt has no additional complaints and denies pain.

## 2016-11-28 NOTE — ED Provider Notes (Signed)
[facility] DEPT Provider Note   CSN: [id number] Arrival date & time: 11/28/16  1922     History   Chief Complaint Chief Complaint  Patient presents with  . Foot Pain    pt has exisitng wounds to both feet was sent in as per the reccomendation of his home health nurse for a black spot noted on his R pinky toe    HPI Jared Hall is a 83 y.o. male.  The history is provided by the patient and medical records.  Shortness of Breath  This is a recurrent problem. The average episode lasts 1 week. The problem occurs continuously.The current episode started more than 1 week ago. The problem has not changed since onset.Associated symptoms include orthopnea (when sleeping on side), rash (on b/l lower legs) and leg swelling. Pertinent negatives include no fever, no headaches, no neck pain, no cough, no sputum production, no wheezing, no chest pain, no syncope, no vomiting, no abdominal pain and no leg pain. He has tried nothing for the symptoms. Associated medical issues include heart failure.  -Patient is a 83 year old male with history of CHF, hypertension, hyperlipidemia, diabetes, stroke who presents to the ED after home health nurse sent him here to get evaluated for right toe complaint. Patient has a black spot on his right fifth toe at the home health nurse concerned about necrosis. There was also concern about possible bilateral leg cellulitis. Patient denies any fevers or chills. He has a history of venous dermatitis. Home health nurse wrapped the legs and compressive dressings for the swelling. -Patient states his legs have been progressively more swollen, has orthopnea and dyspnea on exertion when walking about 40 feet but this is unchanged. Pt has had no chest pain.  Past Medical History:  Diagnosis Date  . Alpha thalassemia ([place])   . BPH (benign prostatic hyperplasia)   . CHF (congestive heart failure) ([place])   . High cholesterol   . Hypertension   . Lower extremity edema   .  Onychomycosis   . Prostate cancer ([place])    S/P "8 weeks of radiation"  . Prostatitis    recurrent  . Sleep apnea   . Stroke [facility]) ~ 2005   denies residual on 2/123/2015  . Type II diabetes mellitus ([place])   . Umbilical hernia   . Urinary urgency    with incontinence    Patient Active Problem List   Diagnosis Date Noted  . Bell's palsy   . Facial droop   . Paroxysmal atrial fibrillation (HCC)   . Embolic stroke ([place])   . Dysphagia 03/14/2015  . Dysarthria 03/14/2015  . Stroke ([place]) 03/14/2015  . Cellulitis 01/11/2014  . Lower extremity edema 01/11/2014  . Venous stasis dermatitis 01/11/2014  . DM type 2 with diabetic peripheral neuropathy ([place]) 01/11/2014  . Cellulitis and abscess of leg 01/11/2014  . Cellulitis and abscess 01/11/2014  . Depression 06/15/2013  . GERD (gastroesophageal reflux disease) 06/15/2013  . Constipation 06/15/2013  . Diabetic neuropathy ([place]) 06/15/2013  . Type I (juvenile type) diabetes mellitus with peripheral circulatory disorders, not stated as uncontrolled(250.71) 05/02/2013  . Essential hypertension, benign 04/07/2013  . AKI (acute kidney injury) ([place]) 04/04/2013  . Cellulitis of leg 04/04/2013  . Right hip pain 04/04/2013  . Renal insufficiency 12/16/2011  . History of CVA (cerebrovascular accident) 12/16/2011  . Dyslipidemia 12/16/2011  . Obesity (BMI 30-39.9) 12/16/2011  . Umbilical hernia with obstruction-partial on CT scan; easily reducible 12/14/2011  . History of PSVT (paroxysmal supraventricular  tachycardia) 12/14/2011  . DM (diabetes mellitus) ([place]) 12/14/2011    Past Surgical History:  Procedure Laterality Date  . HERNIA REPAIR    . VASECTOMY    . VENTRAL HERNIA REPAIR  12/18/2011   Procedure: HERNIA REPAIR VENTRAL ADULT;  Surgeon: Adin Hector, MD;  Location: [facility];  Service: General;  Laterality: N/A;       Home Medications    Prior to Admission medications   Medication Sig Start Date End Date Taking? Authorizing  Provider  amitriptyline (ELAVIL) 25 MG tablet Take 25 mg by mouth at bedtime.   Yes Historical Provider, MD  aspirin EC 81 MG tablet Take 81 mg by mouth daily.   Yes Historical Provider, MD  B Complex Vitamins (VITAMIN B COMPLEX PO) Take 1 tablet by mouth daily.   Yes Historical Provider, MD  clopidogrel (PLAVIX) 75 MG tablet Take 75 mg by mouth daily.   Yes Historical Provider, MD  clotrimazole-betamethasone (LOTRISONE) lotion Apply 1 application topically 2 (two) times daily.   Yes Historical Provider, MD  fluticasone (FLONASE) 50 MCG/ACT nasal spray Place 1 spray into both nostrils daily. 03/17/15  Yes Kelvin Cellar, MD  furosemide (LASIX) 40 MG tablet Take 40 mg by mouth See admin instructions. Take 40mg  in the AM, then take 20mg  at night   Yes Historical Provider, MD  furosemide (LASIX) 80 MG tablet Take 40 mg by mouth daily.   Yes Historical Provider, MD  gabapentin (NEURONTIN) 300 MG capsule Take 300 mg by mouth 3 (three) times daily.   Yes Historical Provider, MD  insulin glargine (LANTUS) 100 UNIT/ML injection Inject 0.4 mLs (40 Units total) into the skin 2 (two) times daily. Patient taking differently: Inject 80 Units into the skin 2 (two) times daily.  03/17/15  Yes Kelvin Cellar, MD  metoprolol tartrate (LOPRESSOR) 25 MG tablet Take 1 tablet (25 mg total) by mouth 2 (two) times daily. 03/17/15  Yes Kelvin Cellar, MD  pantoprazole (PROTONIX) 40 MG tablet Take 40 mg by mouth daily.   Yes Historical Provider, MD  potassium chloride SA (K-DUR,KLOR-CON) 20 MEQ tablet Take 20 mEq by mouth daily.   Yes Historical Provider, MD  sennosides-docusate sodium (SENOKOT-S) 8.6-50 MG tablet Take 1 tablet by mouth daily.   Yes Historical Provider, MD  simvastatin (ZOCOR) 20 MG tablet Take 20 mg by mouth daily.   Yes Historical Provider, MD  terazosin (HYTRIN) 2 MG capsule Take 4 mg by mouth daily.   Yes Historical Provider, MD  vitamin B-12 (CYANOCOBALAMIN) 1000 MCG tablet Take 1,000 mcg by mouth  daily.   Yes Historical Provider, MD  zolpidem (AMBIEN) 5 MG tablet Take 1 tablet (5 mg total) by mouth at bedtime as needed for sleep. 03/18/15  Yes Tiffany L Reed, DO  apixaban (ELIQUIS) 5 MG TABS tablet Take 1 tablet (5 mg total) by mouth 2 (two) times daily. Patient not taking: Reported on 11/28/2016 03/17/15   Kelvin Cellar, MD  docusate sodium (COLACE) 100 MG capsule Take 1 capsule (100 mg total) by mouth 2 (two) times daily. Patient not taking: Reported on 11/28/2016 03/17/15   Kelvin Cellar, MD  HYDROcodone-acetaminophen (NORCO/VICODIN) 5-325 MG per tablet Take two tablets by mouth every 4 hours as needed for pain. Do not exceed 4gm of Tylenol in 24 hours Patient not taking: Reported on 11/28/2016 03/18/15   Tiffany L Reed, DO  predniSONE (STERAPRED UNI-PAK 21 TAB) 10 MG (21) TBPK tablet Take 60 mg PO q daily x 5 days then stop Patient  not taking: Reported on 11/28/2016 03/17/15   Kelvin Cellar, MD  pregabalin (LYRICA) 50 MG capsule Take 1 capsule (50 mg total) by mouth 2 (two) times daily. Patient not taking: Reported on 11/28/2016 03/18/15   Tiffany L Reed, DO  valACYclovir (VALTREX) 1000 MG tablet Take 1 tablet (1,000 mg total) by mouth 3 (three) times daily. Patient not taking: Reported on 11/28/2016 03/17/15   Kelvin Cellar, MD    Family History Family History  Problem Relation Age of Onset  . Pneumonia Mother   . Heart attack Father     Social History Social History  Substance Use Topics  . Smoking status: Never Smoker  . Smokeless tobacco: Never Used  . Alcohol use No     Allergies   Flomax [tamsulosin hcl] and Glucophage [metformin hcl]   Review of Systems Review of Systems  Constitutional: Positive for activity change and fatigue. Negative for chills and fever.  Respiratory: Positive for shortness of breath. Negative for cough, sputum production and wheezing.   Cardiovascular: Positive for orthopnea (when sleeping on side) and leg swelling. Negative for chest pain,  palpitations and syncope.  Gastrointestinal: Negative for abdominal pain, diarrhea, nausea and vomiting.  Musculoskeletal: Negative for back pain and neck pain.  Skin: Positive for rash (on b/l lower legs).  Neurological: Negative for syncope, light-headedness and headaches.  All other systems reviewed and are negative.    Physical Exam Updated Vital Signs BP 110/91   Pulse 86   Temp 98.6 F (37 C) (Oral)   Resp 22   Ht 5\' 11"  (1.803 m)   Wt 123.8 kg   SpO2 96%   BMI 38.08 kg/m   Physical Exam  Constitutional: He appears well-developed and well-nourished. No distress.  HENT:  Head: Normocephalic and atraumatic.  Mouth/Throat: Oropharynx is clear and moist.  Eyes: Conjunctivae are normal.  Neck: Neck supple.  Cardiovascular: Normal rate and regular rhythm.   No murmur heard. Pulmonary/Chest: Effort normal. No respiratory distress. He has no wheezes. He has rales (bibasilar rales).  Abdominal: Soft. There is no tenderness.  Musculoskeletal: He exhibits edema (4+ pitting edema b/l LE with small areas of weeping).       Feet:  No leg or foot tenderness b/l.  Neurological: He is alert.  Skin: Skin is warm and dry.     Stasis dermatitis b/l lower legs. No signs of cellulitis such as induration, increased warmth.   Psychiatric: He has a normal mood and affect.  Nursing note and vitals reviewed.    ED Treatments / Results  Labs (all labs ordered are listed, but only abnormal results are displayed) Labs Reviewed  CBC - Abnormal; Notable for the following:       Result Value   Hemoglobin 12.0 (*)    MCV 69.4 (*)    MCH 21.2 (*)    RDW 16.0 (*)    All other components within normal limits  BASIC METABOLIC PANEL - Abnormal; Notable for the following:    Glucose, Bld 189 (*)    BUN 22 (*)    Creatinine, Ser 1.58 (*)    GFR calc non Af Amer 39 (*)    GFR calc Af Amer 45 (*)    All other components within normal limits  SEDIMENTATION RATE - Abnormal; Notable for the  following:    Sed Rate 18 (*)    All other components within normal limits  BRAIN NATRIURETIC PEPTIDE  C-REACTIVE PROTEIN  I-STAT TROPOININ, ED    EKG  EKG Interpretation  Date/Time:  Tuesday November 28 2016 20:27:25 EST Ventricular Rate:  86 PR Interval:  250 QRS Duration: 72 QT Interval:  354 QTC Calculation: 423 R Axis:   36 Text Interpretation:  Sinus rhythm with 1st degree A-V block Low voltage QRS Inferior infarct , age undetermined Cannot rule out Anteroseptal infarct , age undetermined Abnormal ECG Confirmed by DELO  MD, DOUGLAS ([id number]) on 11/28/2016 8:35:48 PM       Radiology Dg Chest 2 View  Result Date: 11/28/2016 CLINICAL DATA:  Right foot swelling for 5 months. Fifth toe is black on the tip. Shortness of breath for months. EXAM: CHEST  2 VIEW COMPARISON:  03/16/2015 FINDINGS: Cardiac enlargement without vascular congestion or edema. Chronic linear scarring in the left lung base appears similar to prior study. No developing consolidation or airspace disease. No pleural effusions. No pneumothorax. Calcification of the aorta. Degenerative changes in the spine. IMPRESSION: Cardiac enlargement with chronic scarring in the left lung base. No acute changes since prior study. No evidence of acute consolidation. Electronically Signed   By: Lucienne Capers M.D.   On: 11/28/2016 21:25   Dg Foot 2 Views Right  Result Date: 11/28/2016 CLINICAL DATA:  83 y/o M; right foot swelling and black tip of fifth toe. EXAM: RIGHT FOOT - 2 VIEW COMPARISON:  None. FINDINGS: Bones are demineralized. No acute fracture or dislocation is identified. There is loss of joint space of the ankle joint and flattening of the talar dome as well as sub talar degenerative changes and loss of height of the talus. Vascular calcifications. Extensive nonspecific soft tissue swelling of the foot and ankle. Lisfranc alignment is maintained. IMPRESSION: Loss of the ankle joint space, flattening of the talar dome, subtalar  degenerative changes, and loss of height of the talus probably represents early changes of neuropathic joint. No acute fracture is identified. Diffuse soft tissue swelling of the foot and ankle. Electronically Signed   By: Kristine Garbe M.D.   On: 11/28/2016 21:26    Procedures Procedures (including critical care time)  Medications Ordered in ED Medications  furosemide (LASIX) injection 40 mg (40 mg Intravenous Given 11/28/16 2139)     Initial Impression / Assessment and Plan / ED Course  I have reviewed the triage vital signs and the nursing notes.  Pertinent labs & imaging results that were available during my care of the patient were reviewed by me and considered in my medical decision making (see chart for details).  Clinical Course    Patient is a 83 year old male with history of CHF, diabetes, A. fib, stroke, SVT who presents per recommendation of home health nurse for evaluation of right toe spot with concern for necrosis. There is also concern for potential cellulitis of both legs.  On evaluation patient is afebrile and denies any fevers at home. He does complain of progressive worsening leg swelling, orthopnea, dyspnea on exertion. Patient denies any chest pain. On exam patient has significant 3+ pitting edema on bilateral lower extremities and bibasilar rales. Patient is mildly tachypneic and satting in the low 90s on room air.  Patient likely having worsening CHF symptoms. Doubt ACS at this time. Patient states he is compliant with his medications and Lasix. Doubt lower extremity cellulitis. Erythema likely stasis dermatitis. There is no increased warmth, induration or pain to palpation. Spot on the right foot appears to be old hard and small sub-dermal hematoma. Does not appear to look like dry gangrene.  EKG shows sinus rhythm with first-degree  AV block but no new ischemic changes. Obtaining CHF workup. X-ray of the right foot obtained to rule out signs of  osteomyelitis. Inflammatory markers ordered as well. Lasix IV 40 mg given.  Workup unremarkable. Patient has no leukocytosis, inflammatory markers are unremarkable. X-rays show no acute signs of osteomyelitis of the foot. Chest x-ray shows no signs of pulmonary edema. BNP is 38. Patient satting 96% on room air on reevaluation. Patient not requiring any new oxygen at this time. The amount of leg swelling the patient has is likely chronic along with his venous stasis.  Plan to increase his Lasix regimen from 40 mg in the morning and 20 mg at night to 40 mg twice a day. Patient to follow up with PCP and next week to make sure his leg swelling is improving.   Pt seen with attending Dr. Stark Jock.   Final Clinical Impressions(s) / ED Diagnoses   Final diagnoses:  Hematoma of toe of right foot, initial encounter  Leg swelling    New Prescriptions New Prescriptions   No medications on file     Tobie Poet, DO 11/28/16 2214    Veryl Speak, MD 11/29/16 0020

## 2016-11-28 NOTE — Patient Outreach (Signed)
[facility]) Care Management [place] Telephone Outreach, Care Coordination 11/28/2016  Jared Hall 05-04-1931 [id number]  Successful incoming telephone outreach from [place], nurse of Dr. Inda Merlin, PCP [phone]) of [name], [place] y.o.malereferred to [place] from [facility] telephonic RN CM, who received initial high risk referral from MD office. Patient has history including, but not limited to CHF, DM, HTN, HLD, CVA, CKD, paroxysmal AF.   Call was placed earlier today to Dr. Inda Merlin today as a result of acute in-home visit this afternoon that was initiated by patient's caregiver, and daughter-in-law, [name], on [facility] written consent.  [name] had called yesterday to report that patient had developed "wet open sores" on his legs.  See notes from acute in-home visit with patient today.  During our successful telephone call this evening, I updated Katrina on details of today's [place] CM acute in-home visit.  Katrina advises that she will contact patient's caregiver/ daughter-in-law, [name] to advise that Dr. Inda Merlin recommends taking patient to ED for further evaluation of possible new onset of bilateral LE cellulitis and (R) 5th toe necrosis.  Plan:  Will follow up with patient/ caregiver [name] as indicated around Dr. Inda Merlin recommendation to take patient to ED for evaluation.  [name], RN, BSN, [employer] [facility] Care Management  [phone]

## 2016-11-28 NOTE — ED Notes (Signed)
Consulted with Dr. Nyoka Lint, Lasix withheld until pt can be placed on cardiac monitor.

## 2016-11-28 NOTE — ED Notes (Signed)
Patient transported to X-ray 

## 2016-11-28 NOTE — Discharge Instructions (Signed)
It does not appear you have a skin infection for toe infection at this time. Please start taking Lasix 40 mg twice daily for your leg swelling. Follow-up with your regular doctor in 1 week to make sure her leg swelling is improving.

## 2016-11-28 NOTE — Patient Outreach (Signed)
[facility]) Care Management [place] Telephone Outreach, Care Coordination 11/28/2016  [name] 1931/01/06 [id number]  Unsuccessful telephone outreach to Jared Hall, nurse of Dr. Inda Merlin, PCP [phone]) of Jared Hall, 83 y.o. male referred to [place] from [facility] telephonic RN CM, who received initial high risk referral from MD office. Patient has history including, but not limited to CHF, DM, HTN, HLD, CVA, CKD, paroxysmal AF.   Call was placed to Dr. Inda Merlin today as a result of acute in-home visit this afternoon that was initiated by patient's caregiver, and daughter-in-law, [name], on [facility] written consent.  [name] had called yesterday to report that patient had developed "wet open sores" on his legs.  See notes from acute in-home visit with patient today.  Detailed voicemail message was left on direct nurse line with Dr. Inda Merlin, requesting call back regarding acute in-home visit with patient today.  Plan:  Will await call back from Dr. Inda Merlin and update patient/ caregiver [name] afterward.  [name], RN, BSN, [employer] [facility] Care Management  [phone]

## 2016-11-28 NOTE — ED Triage Notes (Signed)
Pt has no complaints follows up with PMD every three months for his leg wounds otherwise is seen and treated by visiting nurse service

## 2016-11-29 ENCOUNTER — Other Ambulatory Visit: Payer: Self-pay | Admitting: *Deleted

## 2016-11-29 NOTE — Patient Outreach (Signed)
[name] Telephone Outreach 11/29/2016  [name] 1930-12-17 [id number]  Successful telephone outreach to [facility], daughter-in-law/ caregiver (on [facility] CM written consent), of Jared Hall is an 83 y.o. male referred to [place] from [facility] telephonic RN CM, who received initial high risk referral from MD office. Patient has history including, but not limited to CHF, DM, HTN, HLD, CVA, CKD, paroxysmal AF. HIPAA/ identity verified today by phone with [name].   Call was placed today to follow up on patient ED visit last night, as patient's PCP advised patient to go to ED after [place] acute home visit yesterday for further evaluation/ assessment.  [name] reports that she and her husband (patient's son) were unable to attend ED visit with patient last night; all questions about course of ED visit were answered.  Patient's bilateral legs were wrapped by [facility] RN CM yesterday with supplies present in his home, and [name] reports that she is unsure if patient's legs were re-wrapped after ED evaluation was performed.  I encouraged [name] to obtain this information, and to contact patient's PCP for post- ED visit care instruction/ to determine if PCP would like to schedule ED follow up appointment prior to next scheduled office visit on January 18, 2017.  [name] verbalized that she would like to have home health [facility]) services resume for patient's leg wrapping, which I agreed with, and [name] stated that she would inquire about order for same when she contacted PCP today.  [name] agreed to contact [facility] RN CM if she or patient had further questions or concerns, or needed assistance to address any patient issues.  We scheduled next [place] in-home visit for later this month during our call today.   [name] denies further questions, issues, concerns or problems today, and I confirmed that she had my direct phone number, the [facility] CM main office number, and  the 24-hour [facility] nurse advice line.  Plan:  Mr. Condry caregiver/ daughter-in-law will communicate with patient's PCP regarding follow up plan after ED visit, and to inquire about possibility of resuming home health services for bilateral leg wrapping.  If she is unable to reach PCP, caregiver will contact [facility] RN CM.  Mr. Marceleno continue to take his medications as they are prescribed and will attend all provider appointments.  Mr. Campese continue to monitor his blood sugars 2-3daily, and will continue recording these values.  Mr. Benziger continue to use his walker for assistance with ambulation.  Mr. Jenson contact his providers for any concerns, needs, issues, or problems that arise.  [place] outreach to continue with scheduled home visit later this month.  [name], RN, BSN, [employer] [facility] Care Management  [phone]

## 2016-11-30 ENCOUNTER — Ambulatory Visit: Payer: Self-pay | Admitting: *Deleted

## 2016-12-05 ENCOUNTER — Encounter (HOSPITAL_COMMUNITY): Payer: Self-pay | Admitting: Emergency Medicine

## 2016-12-05 ENCOUNTER — Inpatient Hospital Stay (HOSPITAL_COMMUNITY)
Admission: EM | Admit: 2016-12-05 | Discharge: 2016-12-15 | DRG: 871 | Disposition: A | Discharge status: Home Health | Payer: Medicare Other | Attending: Internal Medicine | Admitting: Internal Medicine

## 2016-12-05 ENCOUNTER — Observation Stay (HOSPITAL_COMMUNITY): Payer: Medicare Other

## 2016-12-05 DIAGNOSIS — L03119 Cellulitis of unspecified part of limb: Secondary | ICD-10-CM

## 2016-12-05 DIAGNOSIS — N401 Enlarged prostate with lower urinary tract symptoms: Secondary | ICD-10-CM | POA: Diagnosis present

## 2016-12-05 DIAGNOSIS — Z8673 Personal history of transient ischemic attack (TIA), and cerebral infarction without residual deficits: Secondary | ICD-10-CM

## 2016-12-05 DIAGNOSIS — I872 Venous insufficiency (chronic) (peripheral): Secondary | ICD-10-CM | POA: Diagnosis present

## 2016-12-05 DIAGNOSIS — R600 Localized edema: Secondary | ICD-10-CM

## 2016-12-05 DIAGNOSIS — E1022 Type 1 diabetes mellitus with diabetic chronic kidney disease: Secondary | ICD-10-CM | POA: Diagnosis present

## 2016-12-05 DIAGNOSIS — L03115 Cellulitis of right lower limb: Secondary | ICD-10-CM | POA: Diagnosis not present

## 2016-12-05 DIAGNOSIS — I5032 Chronic diastolic (congestive) heart failure: Secondary | ICD-10-CM | POA: Diagnosis not present

## 2016-12-05 DIAGNOSIS — N183 Chronic kidney disease, stage 3 unspecified: Secondary | ICD-10-CM | POA: Diagnosis present

## 2016-12-05 DIAGNOSIS — J9601 Acute respiratory failure with hypoxia: Secondary | ICD-10-CM | POA: Diagnosis not present

## 2016-12-05 DIAGNOSIS — I130 Hypertensive heart and chronic kidney disease with heart failure and stage 1 through stage 4 chronic kidney disease, or unspecified chronic kidney disease: Secondary | ICD-10-CM | POA: Diagnosis present

## 2016-12-05 DIAGNOSIS — Z9119 Patient's noncompliance with other medical treatment and regimen: Secondary | ICD-10-CM

## 2016-12-05 DIAGNOSIS — L02419 Cutaneous abscess of limb, unspecified: Secondary | ICD-10-CM | POA: Diagnosis present

## 2016-12-05 DIAGNOSIS — Z7902 Long term (current) use of antithrombotics/antiplatelets: Secondary | ICD-10-CM

## 2016-12-05 DIAGNOSIS — Z7901 Long term (current) use of anticoagulants: Secondary | ICD-10-CM

## 2016-12-05 DIAGNOSIS — J9600 Acute respiratory failure, unspecified whether with hypoxia or hypercapnia: Secondary | ICD-10-CM

## 2016-12-05 DIAGNOSIS — I10 Essential (primary) hypertension: Secondary | ICD-10-CM | POA: Diagnosis not present

## 2016-12-05 DIAGNOSIS — E1142 Type 2 diabetes mellitus with diabetic polyneuropathy: Secondary | ICD-10-CM | POA: Diagnosis present

## 2016-12-05 DIAGNOSIS — E669 Obesity, unspecified: Secondary | ICD-10-CM

## 2016-12-05 DIAGNOSIS — Z6841 Body Mass Index (BMI) 40.0 and over, adult: Secondary | ICD-10-CM

## 2016-12-05 DIAGNOSIS — A4152 Sepsis due to Pseudomonas: Principal | ICD-10-CM | POA: Diagnosis present

## 2016-12-05 DIAGNOSIS — Z79891 Long term (current) use of opiate analgesic: Secondary | ICD-10-CM

## 2016-12-05 DIAGNOSIS — Z8546 Personal history of malignant neoplasm of prostate: Secondary | ICD-10-CM

## 2016-12-05 DIAGNOSIS — Z7982 Long term (current) use of aspirin: Secondary | ICD-10-CM

## 2016-12-05 DIAGNOSIS — L02415 Cutaneous abscess of right lower limb: Secondary | ICD-10-CM | POA: Diagnosis present

## 2016-12-05 DIAGNOSIS — H9190 Unspecified hearing loss, unspecified ear: Secondary | ICD-10-CM

## 2016-12-05 DIAGNOSIS — R0602 Shortness of breath: Secondary | ICD-10-CM | POA: Diagnosis not present

## 2016-12-05 DIAGNOSIS — L03319 Cellulitis of trunk, unspecified: Secondary | ICD-10-CM | POA: Diagnosis not present

## 2016-12-05 DIAGNOSIS — L03116 Cellulitis of left lower limb: Secondary | ICD-10-CM | POA: Diagnosis not present

## 2016-12-05 DIAGNOSIS — I878 Other specified disorders of veins: Secondary | ICD-10-CM | POA: Diagnosis present

## 2016-12-05 DIAGNOSIS — Z794 Long term (current) use of insulin: Secondary | ICD-10-CM

## 2016-12-05 DIAGNOSIS — E1051 Type 1 diabetes mellitus with diabetic peripheral angiopathy without gangrene: Secondary | ICD-10-CM | POA: Diagnosis present

## 2016-12-05 DIAGNOSIS — J189 Pneumonia, unspecified organism: Secondary | ICD-10-CM | POA: Diagnosis not present

## 2016-12-05 DIAGNOSIS — Z888 Allergy status to other drugs, medicaments and biological substances status: Secondary | ICD-10-CM

## 2016-12-05 DIAGNOSIS — L0390 Cellulitis, unspecified: Secondary | ICD-10-CM | POA: Diagnosis present

## 2016-12-05 DIAGNOSIS — E785 Hyperlipidemia, unspecified: Secondary | ICD-10-CM | POA: Diagnosis present

## 2016-12-05 DIAGNOSIS — E7800 Pure hypercholesterolemia, unspecified: Secondary | ICD-10-CM | POA: Diagnosis present

## 2016-12-05 DIAGNOSIS — G4733 Obstructive sleep apnea (adult) (pediatric): Secondary | ICD-10-CM | POA: Diagnosis present

## 2016-12-05 DIAGNOSIS — K219 Gastro-esophageal reflux disease without esophagitis: Secondary | ICD-10-CM | POA: Diagnosis present

## 2016-12-05 DIAGNOSIS — E10649 Type 1 diabetes mellitus with hypoglycemia without coma: Secondary | ICD-10-CM | POA: Diagnosis present

## 2016-12-05 DIAGNOSIS — I480 Paroxysmal atrial fibrillation: Secondary | ICD-10-CM | POA: Diagnosis present

## 2016-12-05 DIAGNOSIS — Z79899 Other long term (current) drug therapy: Secondary | ICD-10-CM

## 2016-12-05 DIAGNOSIS — R030 Elevated blood-pressure reading, without diagnosis of hypertension: Secondary | ICD-10-CM | POA: Diagnosis not present

## 2016-12-05 LAB — CBC WITH DIFFERENTIAL/PLATELET
BASOS PCT: 0 %
Basophils Absolute: 0 10*3/uL (ref 0.0–0.1)
EOS ABS: 0.1 10*3/uL (ref 0.0–0.7)
Eosinophils Relative: 1 %
HCT: 36.1 % — ABNORMAL LOW (ref 39.0–52.0)
HEMOGLOBIN: 11.4 g/dL — AB (ref 13.0–17.0)
LYMPHS ABS: 1.5 10*3/uL (ref 0.7–4.0)
LYMPHS PCT: 11 %
MCH: 21.7 pg — AB (ref 26.0–34.0)
MCHC: 31.6 g/dL (ref 30.0–36.0)
MCV: 68.8 fL — ABNORMAL LOW (ref 78.0–100.0)
Monocytes Absolute: 1.4 10*3/uL — ABNORMAL HIGH (ref 0.1–1.0)
Monocytes Relative: 10 %
NEUTROS ABS: 10.5 10*3/uL — AB (ref 1.7–7.7)
Neutrophils Relative %: 78 %
Platelets: 240 10*3/uL (ref 150–400)
RBC: 5.25 MIL/uL (ref 4.22–5.81)
RDW: 15.8 % — AB (ref 11.5–15.5)
WBC: 13.5 10*3/uL — ABNORMAL HIGH (ref 4.0–10.5)

## 2016-12-05 LAB — I-STAT CG4 LACTIC ACID, ED
LACTIC ACID, VENOUS: 1.24 mmol/L (ref 0.5–1.9)
LACTIC ACID, VENOUS: 1.36 mmol/L (ref 0.5–1.9)

## 2016-12-05 LAB — BASIC METABOLIC PANEL
Anion gap: 7 (ref 5–15)
BUN: 26 mg/dL — AB (ref 6–20)
CALCIUM: 9.2 mg/dL (ref 8.9–10.3)
CHLORIDE: 101 mmol/L (ref 101–111)
CO2: 31 mmol/L (ref 22–32)
CREATININE: 1.43 mg/dL — AB (ref 0.61–1.24)
GFR calc Af Amer: 51 mL/min — ABNORMAL LOW (ref >60)
GFR calc non Af Amer: 44 mL/min — ABNORMAL LOW (ref >60)
Glucose, Bld: 177 mg/dL — ABNORMAL HIGH (ref 65–99)
Potassium: 3.7 mmol/L (ref 3.5–5.1)
SODIUM: 139 mmol/L (ref 135–145)

## 2016-12-05 LAB — BRAIN NATRIURETIC PEPTIDE: B Natriuretic Peptide: 37.8 pg/mL (ref 0.0–100.0)

## 2016-12-05 MED ORDER — POTASSIUM CHLORIDE CRYS ER 20 MEQ PO TBCR
20.0000 meq | EXTENDED_RELEASE_TABLET | Freq: Every day | ORAL | Status: DC
  Administered 2016-12-06 – 2016-12-14 (×9): 20 meq via ORAL
  Filled 2016-12-05 (×9): qty 1

## 2016-12-05 MED ORDER — INSULIN GLARGINE 100 UNIT/ML ~~LOC~~ SOLN
40.0000 [IU] | Freq: Two times a day (BID) | SUBCUTANEOUS | Status: DC
  Administered 2016-12-05 – 2016-12-07 (×4): 40 [IU] via SUBCUTANEOUS
  Filled 2016-12-05 (×5): qty 0.4

## 2016-12-05 MED ORDER — GABAPENTIN 300 MG PO CAPS
300.0000 mg | ORAL_CAPSULE | Freq: Three times a day (TID) | ORAL | Status: DC
Start: 2016-12-05 — End: 2016-12-15
  Administered 2016-12-06 – 2016-12-15 (×29): 300 mg via ORAL
  Filled 2016-12-05 (×29): qty 1

## 2016-12-05 MED ORDER — IPRATROPIUM BROMIDE 0.02 % IN SOLN: 0.5000 mg | RESPIRATORY_TRACT | Status: DC | PRN

## 2016-12-05 MED ORDER — CLINDAMYCIN PHOSPHATE 600 MG/50ML IV SOLN
600.0000 mg | Freq: Once | INTRAVENOUS | Status: AC
  Administered 2016-12-05: 600 mg via INTRAVENOUS
  Filled 2016-12-05: qty 50

## 2016-12-05 MED ORDER — ONDANSETRON HCL 4 MG/2ML IJ SOLN: 4.0000 mg | Freq: Four times a day (QID) | INTRAMUSCULAR | Status: DC | PRN

## 2016-12-05 MED ORDER — TERBINAFINE HCL 250 MG PO TABS
250.0000 mg | ORAL_TABLET | Freq: Every day | ORAL | Status: DC
  Administered 2016-12-06 – 2016-12-15 (×10): 250 mg via ORAL
  Filled 2016-12-05 (×10): qty 1

## 2016-12-05 MED ORDER — CLOTRIMAZOLE-BETAMETHASONE 1-0.05 % EX LOTN
1.0000 "application " | TOPICAL_LOTION | Freq: Two times a day (BID) | CUTANEOUS | Status: DC | PRN
  Filled 2016-12-05: qty 30

## 2016-12-05 MED ORDER — INSULIN ASPART 100 UNIT/ML ~~LOC~~ SOLN
0.0000 [IU] | Freq: Every day | SUBCUTANEOUS | Status: DC
  Administered 2016-12-06 – 2016-12-11 (×3): 2 [IU] via SUBCUTANEOUS
  Administered 2016-12-13: 3 [IU] via SUBCUTANEOUS
  Administered 2016-12-14: 2 [IU] via SUBCUTANEOUS

## 2016-12-05 MED ORDER — ONDANSETRON HCL 4 MG PO TABS: 4.0000 mg | ORAL_TABLET | Freq: Four times a day (QID) | ORAL | Status: DC | PRN

## 2016-12-05 MED ORDER — CLOPIDOGREL BISULFATE 75 MG PO TABS
75.0000 mg | ORAL_TABLET | Freq: Every day | ORAL | Status: DC
  Administered 2016-12-06 – 2016-12-15 (×10): 75 mg via ORAL
  Filled 2016-12-05 (×10): qty 1

## 2016-12-05 MED ORDER — METOPROLOL TARTRATE 25 MG PO TABS
25.0000 mg | ORAL_TABLET | Freq: Two times a day (BID) | ORAL | Status: DC
  Administered 2016-12-06 – 2016-12-15 (×20): 25 mg via ORAL
  Filled 2016-12-05 (×20): qty 1

## 2016-12-05 MED ORDER — ACETAMINOPHEN 650 MG RE SUPP: 650.0000 mg | Freq: Four times a day (QID) | RECTAL | Status: DC | PRN

## 2016-12-05 MED ORDER — INSULIN ASPART 100 UNIT/ML ~~LOC~~ SOLN
0.0000 [IU] | Freq: Three times a day (TID) | SUBCUTANEOUS | Status: DC
  Administered 2016-12-06 (×2): 2 [IU] via SUBCUTANEOUS
  Administered 2016-12-07 (×2): 3 [IU] via SUBCUTANEOUS
  Administered 2016-12-07: 2 [IU] via SUBCUTANEOUS
  Administered 2016-12-08 – 2016-12-11 (×7): 3 [IU] via SUBCUTANEOUS
  Administered 2016-12-11: 8 [IU] via SUBCUTANEOUS
  Administered 2016-12-12 (×2): 5 [IU] via SUBCUTANEOUS
  Administered 2016-12-13: 3 [IU] via SUBCUTANEOUS
  Administered 2016-12-13 – 2016-12-14 (×2): 5 [IU] via SUBCUTANEOUS
  Administered 2016-12-14: 8 [IU] via SUBCUTANEOUS

## 2016-12-05 MED ORDER — ALBUTEROL SULFATE (2.5 MG/3ML) 0.083% IN NEBU
2.5000 mg | INHALATION_SOLUTION | RESPIRATORY_TRACT | Status: DC | PRN
  Administered 2016-12-07: 2.5 mg via RESPIRATORY_TRACT
  Filled 2016-12-05: qty 3

## 2016-12-05 MED ORDER — SENNOSIDES-DOCUSATE SODIUM 8.6-50 MG PO TABS
1.0000 | ORAL_TABLET | Freq: Every day | ORAL | Status: DC
  Administered 2016-12-06 – 2016-12-15 (×9): 1 via ORAL
  Filled 2016-12-05 (×9): qty 1

## 2016-12-05 MED ORDER — ZOLPIDEM TARTRATE 5 MG PO TABS
5.0000 mg | ORAL_TABLET | Freq: Every evening | ORAL | Status: DC | PRN
  Filled 2016-12-05: qty 1

## 2016-12-05 MED ORDER — ENOXAPARIN SODIUM 60 MG/0.6ML ~~LOC~~ SOLN
60.0000 mg | Freq: Every day | SUBCUTANEOUS | Status: DC
  Administered 2016-12-05 – 2016-12-14 (×10): 60 mg via SUBCUTANEOUS
  Filled 2016-12-05 (×10): qty 0.6

## 2016-12-05 MED ORDER — ASPIRIN EC 81 MG PO TBEC
81.0000 mg | DELAYED_RELEASE_TABLET | Freq: Every day | ORAL | Status: DC
  Administered 2016-12-06 – 2016-12-15 (×10): 81 mg via ORAL
  Filled 2016-12-05 (×10): qty 1

## 2016-12-05 MED ORDER — AMITRIPTYLINE HCL 25 MG PO TABS
25.0000 mg | ORAL_TABLET | Freq: Every day | ORAL | Status: DC
  Administered 2016-12-06 – 2016-12-14 (×10): 25 mg via ORAL
  Filled 2016-12-05 (×11): qty 1

## 2016-12-05 MED ORDER — CLINDAMYCIN PHOSPHATE 600 MG/50ML IV SOLN
600.0000 mg | Freq: Three times a day (TID) | INTRAVENOUS | Status: DC
  Administered 2016-12-06 – 2016-12-07 (×5): 600 mg via INTRAVENOUS
  Filled 2016-12-05 (×5): qty 50

## 2016-12-05 MED ORDER — FLUTICASONE PROPIONATE 50 MCG/ACT NA SUSP
1.0000 | Freq: Every day | NASAL | Status: DC
  Administered 2016-12-06 – 2016-12-15 (×10): 1 via NASAL
  Filled 2016-12-05: qty 16

## 2016-12-05 MED ORDER — TERAZOSIN HCL 2 MG PO CAPS
4.0000 mg | ORAL_CAPSULE | Freq: Every day | ORAL | Status: DC
  Administered 2016-12-06 – 2016-12-15 (×10): 4 mg via ORAL
  Filled 2016-12-05 (×10): qty 2

## 2016-12-05 MED ORDER — ACETAMINOPHEN 325 MG PO TABS
650.0000 mg | ORAL_TABLET | Freq: Four times a day (QID) | ORAL | Status: DC | PRN
Start: 2016-12-05 — End: 2016-12-15

## 2016-12-05 MED ORDER — HYDROCODONE-ACETAMINOPHEN 7.5-325 MG PO TABS
2.0000 | ORAL_TABLET | Freq: Three times a day (TID) | ORAL | Status: DC | PRN
  Administered 2016-12-06 – 2016-12-07 (×5): 2 via ORAL
  Filled 2016-12-05 (×5): qty 2

## 2016-12-05 MED ORDER — PANTOPRAZOLE SODIUM 40 MG PO TBEC
40.0000 mg | DELAYED_RELEASE_TABLET | Freq: Every day | ORAL | Status: DC
  Administered 2016-12-06 – 2016-12-15 (×10): 40 mg via ORAL
  Filled 2016-12-05 (×10): qty 1

## 2016-12-05 MED ORDER — FUROSEMIDE 10 MG/ML IJ SOLN
40.0000 mg | INTRAMUSCULAR | Status: AC
  Administered 2016-12-05: 40 mg via INTRAVENOUS
  Filled 2016-12-05: qty 4

## 2016-12-05 MED ORDER — SIMVASTATIN 20 MG PO TABS
20.0000 mg | ORAL_TABLET | Freq: Every day | ORAL | Status: DC
  Administered 2016-12-06 – 2016-12-15 (×10): 20 mg via ORAL
  Filled 2016-12-05 (×10): qty 1

## 2016-12-05 MED ORDER — BUPROPION HCL ER (SR) 150 MG PO TB12
150.0000 mg | ORAL_TABLET | Freq: Every day | ORAL | Status: DC
Start: 2016-12-06 — End: 2016-12-15
  Administered 2016-12-06 – 2016-12-15 (×10): 150 mg via ORAL
  Filled 2016-12-05 (×10): qty 1

## 2016-12-05 NOTE — H&P (Signed)
History and Physical    CARLESTER DAYS [id number] DOB: 17-Mar-1931 DOA: 12/05/2016  Referring MD/NP/PA: Gloriann Loan, PA-C PCP: Henrine Screws, MD  Patient coming from: Home  Chief Complaint: Legs are more swollen and weeping  HPI: Jared Hall is a 83 y.o. male with medical history significant of HTN, HLD, CVA, CHF last EF showed 55% in 02/2015, DM type II, b/l lower extremity edema; who presents with worsening lower extremity swelling. Patient was sent to the ED 7 days ago for reports of his right fifth digit turning black. He was recommended to increase Lasix to 40 mg twice daily. 4 days ago he saw his PCP who started him on Augmentin which she's been taking twice daily as prescribed. He reports that he noticed that his leg started to weep yesterday. His home health nurses evaluated him reported worsening erythema and drainage from the legs. Associated symptoms include worsening dyspnea with exertion, orthopnea, and weight gain. He denies having any fevers, chills, or chest pain.   ED Course: Upon admission patient was evaluated and seen to be afebrile, heart rates up to 107, and all other vitals relatively within normal limits. Patient was started on clindamycin in the ED . Blood and wound cultures obtained.  Review of Systems: As per HPI otherwise 10 point review of systems negative.   Past Medical History:  Diagnosis Date  . Alpha thalassemia ([place])   . BPH (benign prostatic hyperplasia)   . CHF (congestive heart failure) ([place])   . High cholesterol   . Hypertension   . Lower extremity edema   . Onychomycosis   . Prostate cancer ([place])    S/P "8 weeks of radiation"  . Prostatitis    recurrent  . Sleep apnea   . Stroke [facility]) ~ 2005   denies residual on 2/123/2015  . Type II diabetes mellitus ([place])   . Umbilical hernia   . Urinary urgency    with incontinence    Past Surgical History:  Procedure Laterality Date  . HERNIA REPAIR    . VASECTOMY    . VENTRAL HERNIA REPAIR   12/18/2011   Procedure: HERNIA REPAIR VENTRAL ADULT;  Surgeon: Adin Hector, MD;  Location: [facility];  Service: General;  Laterality: N/A;     reports that he has never smoked. He has never used smokeless tobacco. He reports that he does not drink alcohol or use drugs.  Allergies  Allergen Reactions  . Flomax [Tamsulosin Hcl] Other (See Comments)    Excessive tearing  . Glucophage [Metformin Hcl] Diarrhea    Family History  Problem Relation Age of Onset  . Pneumonia Mother   . Heart attack Father     Prior to Admission medications   Medication Sig Start Date End Date Taking? Authorizing Provider  amitriptyline (ELAVIL) 25 MG tablet Take 25 mg by mouth at bedtime.   Yes Historical Provider, MD  amoxicillin-clavulanate (AUGMENTIN) 875-125 MG tablet Take 1 tablet by mouth 2 (two) times daily. 11/29/16-12/09/16 11/29/16  Yes Historical Provider, MD  aspirin EC 81 MG tablet Take 81 mg by mouth daily.   Yes Historical Provider, MD  B Complex Vitamins (VITAMIN B COMPLEX PO) Take 1 tablet by mouth daily.   Yes Historical Provider, MD  buPROPion (WELLBUTRIN SR) 150 MG 12 hr tablet Take 150 mg by mouth daily. 11/17/16  Yes Historical Provider, MD  clopidogrel (PLAVIX) 75 MG tablet Take 75 mg by mouth daily.   Yes Historical Provider, MD  clotrimazole-betamethasone (LOTRISONE) lotion Apply  1 application topically 2 (two) times daily as needed (antifungal).    Yes Historical Provider, MD  fluticasone (FLONASE) 50 MCG/ACT nasal spray Place 1 spray into both nostrils daily. 03/17/15  Yes Kelvin Cellar, MD  furosemide (LASIX) 80 MG tablet Take 40-80 mg by mouth 2 (two) times daily. Take 80mg  in the am and then take 40mg  in the evening 11/23/16  Yes Historical Provider, MD  gabapentin (NEURONTIN) 300 MG capsule Take 300 mg by mouth 3 (three) times daily.   Yes Historical Provider, MD  HYDROcodone-acetaminophen (NORCO) 7.5-325 MG tablet Take 2 tablets by mouth 3 (three) times daily as needed for pain.  11/24/16  Yes Historical Provider, MD  insulin glargine (LANTUS) 100 UNIT/ML injection Inject 0.4 mLs (40 Units total) into the skin 2 (two) times daily. Patient taking differently: Inject 60 Units into the skin 3 (three) times daily.  03/17/15  Yes Kelvin Cellar, MD  metoprolol tartrate (LOPRESSOR) 25 MG tablet Take 1 tablet (25 mg total) by mouth 2 (two) times daily. 03/17/15  Yes Kelvin Cellar, MD  pantoprazole (PROTONIX) 40 MG tablet Take 40 mg by mouth daily.   Yes Historical Provider, MD  potassium chloride SA (K-DUR,KLOR-CON) 20 MEQ tablet Take 20 mEq by mouth daily.   Yes Historical Provider, MD  sennosides-docusate sodium (SENOKOT-S) 8.6-50 MG tablet Take 1 tablet by mouth daily.   Yes Historical Provider, MD  simvastatin (ZOCOR) 20 MG tablet Take 20 mg by mouth daily.   Yes Historical Provider, MD  terazosin (HYTRIN) 2 MG capsule Take 4 mg by mouth daily.   Yes Historical Provider, MD  terbinafine (LAMISIL) 250 MG tablet Take 250 mg by mouth daily. continous 11/08/16  Yes Historical Provider, MD  vitamin B-12 (CYANOCOBALAMIN) 1000 MCG tablet Take 1,000 mcg by mouth daily.   Yes Historical Provider, MD  zolpidem (AMBIEN) 5 MG tablet Take 1 tablet (5 mg total) by mouth at bedtime as needed for sleep. 03/18/15  Yes Tiffany L Reed, DO  apixaban (ELIQUIS) 5 MG TABS tablet Take 1 tablet (5 mg total) by mouth 2 (two) times daily. Patient not taking: Reported on 12/05/2016 03/17/15   Kelvin Cellar, MD  docusate sodium (COLACE) 100 MG capsule Take 1 capsule (100 mg total) by mouth 2 (two) times daily. Patient taking differently: Take 100 mg by mouth 2 (two) times daily as needed for moderate constipation.  03/17/15   Kelvin Cellar, MD  furosemide (LASIX) 40 MG tablet Take 1 tablet (40 mg total) by mouth 2 (two) times daily. Take 40mg  in the AM, then take 20mg  at night Patient not taking: Reported on 12/05/2016 11/28/16   Tobie Poet, DO  HYDROcodone-acetaminophen (NORCO/VICODIN) 5-325 MG per tablet  Take two tablets by mouth every 4 hours as needed for pain. Do not exceed 4gm of Tylenol in 24 hours Patient not taking: Reported on 12/05/2016 03/18/15   Tiffany L Reed, DO  predniSONE (STERAPRED UNI-PAK 21 TAB) 10 MG (21) TBPK tablet Take 60 mg PO q daily x 5 days then stop Patient not taking: Reported on 12/05/2016 03/17/15   Kelvin Cellar, MD  pregabalin (LYRICA) 50 MG capsule Take 1 capsule (50 mg total) by mouth 2 (two) times daily. Patient not taking: Reported on 12/05/2016 03/18/15   Tiffany L Reed, DO  valACYclovir (VALTREX) 1000 MG tablet Take 1 tablet (1,000 mg total) by mouth 3 (three) times daily. Patient not taking: Reported on 12/05/2016 03/17/15   Kelvin Cellar, MD    Physical Exam:   Constitutional: NAD,  calm, comfortable Vitals:   12/05/16 1745 12/05/16 1806 12/05/16 1830 12/05/16 2040  BP:  (!) 132/117 145/74 141/68  Pulse: 100 99 102 107  Resp:  20 18 18   Temp:      TempSrc:      SpO2: 95% 94% 97% 94%  Weight:      Height:       Eyes: PERRL, lids and conjunctivae normal ENMT: Mucous membranes are moist. Posterior pharynx clear of any exudate or lesions.Normal dentition.  Neck: normal, supple, no masses, no thyromegaly Respiratory: clear to auscultation bilaterally, no wheezing, no crackles. Normal respiratory effort. No accessory muscle use.  Cardiovascular: Regular rate and rhythm, no murmurs / rubs / gallops. 3+ pitting lower extremity edema . 2+ pedal pulses. No carotid bruits.  Abdomen: no tenderness, no masses palpated. No hepatosplenomegaly. Bowel sounds positive.  Musculoskeletal: no clubbing / cyanosis. No joint deformity upper and lower extremities. Good ROM, no contractures. Normal muscle tone.  Skin:  Bilateral lower extremity venous stasis with ulcerations weeping serosanguineous fluid with increased warmth and erythema noted. Worse on the right lower extremity. Neurologic: CN 2-12 grossly intact. Sensation abnormal, DTR normal. Strength 5/5 in all 4.    Psychiatric: Normal judgment and insight. Alert and oriented x 3. Normal mood.     Labs on Admission: I have personally reviewed following labs and imaging studies  CBC:  Recent Labs Lab 12/05/16 1741  WBC 13.5*  NEUTROABS 10.5*  HGB 11.4*  HCT 36.1*  MCV 68.8*  PLT [id number]   Basic Metabolic Panel:  Recent Labs Lab 12/05/16 1741  NA 139  K 3.7  CL 101  CO2 31  GLUCOSE 177*  BUN 26*  CREATININE 1.43*  CALCIUM 9.2   GFR: Estimated Creatinine Clearance: 52.8 mL/min (by C-G formula based on SCr of 1.43 mg/dL (H)). Liver Function Tests: No results for input(s): AST, ALT, ALKPHOS, BILITOT, PROT, ALBUMIN in the last 168 hours. No results for input(s): LIPASE, AMYLASE in the last 168 hours. No results for input(s): AMMONIA in the last 168 hours. Coagulation Profile: No results for input(s): INR, PROTIME in the last 168 hours. Cardiac Enzymes: No results for input(s): CKTOTAL, CKMB, CKMBINDEX, TROPONINI in the last 168 hours. BNP (last 3 results) No results for input(s): PROBNP in the last 8760 hours. HbA1C: No results for input(s): HGBA1C in the last 72 hours. CBG: No results for input(s): GLUCAP in the last 168 hours. Lipid Profile: No results for input(s): CHOL, HDL, LDLCALC, TRIG, CHOLHDL, LDLDIRECT in the last 72 hours. Thyroid Function Tests: No results for input(s): TSH, T4TOTAL, FREET4, T3FREE, THYROIDAB in the last 72 hours. Anemia Panel: No results for input(s): VITAMINB12, FOLATE, FERRITIN, TIBC, IRON, RETICCTPCT in the last 72 hours. Urine analysis:    Component Value Date/Time   COLORURINE YELLOW 12/06/2014 1414   APPEARANCEUR CLEAR 12/06/2014 1414   LABSPEC 1.012 12/06/2014 1414   PHURINE 6.5 12/06/2014 1414   GLUCOSEU NEGATIVE 12/06/2014 1414   HGBUR NEGATIVE 12/06/2014 1414   BILIRUBINUR NEGATIVE 12/06/2014 1414   KETONESUR 40 (A) 12/06/2014 1414   PROTEINUR NEGATIVE 12/06/2014 1414   UROBILINOGEN 1.0 12/06/2014 1414   NITRITE NEGATIVE  12/06/2014 1414   LEUKOCYTESUR NEGATIVE 12/06/2014 1414   Sepsis Labs: No results found for this or any previous visit (from the past 240 hour(s)).   Radiological Exams on Admission: No results found.   Assessment/Plan Sepsis secondary to cellulitis of the leg: Patient presents tachycardic, tachypneic, WBC of 13.5, and lower extremity redness and swelling. Cultures  obtained in ED. Patient started on clindamycin and area of erythema marked. - Admit to a telemetry bed - Follow-up on cultures - Continue clindamycin  - Determine if further imaging studies need to be performed - Wound care consult  Bilateral lower extremity swelling: Acutely worsened.BNP does not appear to be elevated, but question if patient acutely overloaded. - Elevate bilateral lower extremity - check echocardiogram - Given 40 mg of Lasix IV 1 dose - Question need of continued diuresis with IV diuretics  Dyspnea - Continuous pulse oximetry with nasal cannula oxygen as needed. - Albuterol nebs prn shortness of breath/wheezing  Chronic kidney disease stage III: Slightly improved from previous values seen 1 week ago a BUN of 22 and creatinine of 1.58 - Recheck BMP in a.m.  Diabetes mellitus type 1 - Decreased home Lantus from 60 units three times daily to 40 units twice a day while hospitalized in a more controlled setting.  - CBGs every before meals and at bedtime with moderate sliding scale insulin.  CAD/history of CVA - Continue Plavix and aspirin  History of PSVT/ PAF: Eliquis was reported to be discontinued and patient was started on Plavix.  Essential hypertension  - Continue metoprolol   Hyperlipidemia - Continue simvastatin   DVT prophylaxis: Lovenox Code Status: Full Family Communication: No family present at bedside Disposition Plan: TBD  Consults called: none Admission status: observation  Norval Morton MD [facility] Pager [phone]  If 7PM-7AM, please contact  night-coverage [email or web address] Password [facility]  12/05/2016, 9:53 PM

## 2016-12-05 NOTE — ED Notes (Signed)
Changed pt's brief with help of tech.  Pt had large crystalized balls of urine in his diaper.  Pt urinated while being changed, no obvious crystals noted in urinary flow.  Alerted hospitalist.  Pt placed in new brief and sat up on side of bed per pt's request.  Pt propped up safely & securely and within eyesight of RN at all times.  Pt placed on [place] 2L d/t complaints of 'not being able to breathe' despite sats in the mid 90s and no accessory resp muscle use.

## 2016-12-05 NOTE — ED Provider Notes (Signed)
[facility] DEPT Provider Note   CSN: [id number] Arrival date & time: 12/05/16  1659     History   Chief Complaint Chief Complaint  Patient presents with  . Leg Swelling    Bilateral    HPI ENDERSON RIOPELLE is a 83 y.o. male.  HPI SHREYAAN GRZYBOWSKI is a 83 y.o. male with PMH significant for CVA, lower extremity edema, DM, and CHF who presents with bilateral lower extremity edema.  Presents from home.  States his home health nurse sent him to the ED for further management of his weeping edema.  He reports being started on an antibiotic on Friday and has been taking 2 tablets for the last 4 days.  No fever, chills, CP, SOB, N/V.  He states his legs may be slightly more swollen than normal. He was seen 11/29/15 in th ED and discharged after clear CXR and normal BNP.    Past Medical History:  Diagnosis Date  . Alpha thalassemia ([place])   . BPH (benign prostatic hyperplasia)   . CHF (congestive heart failure) ([place])   . High cholesterol   . Hypertension   . Lower extremity edema   . Onychomycosis   . Prostate cancer ([place])    S/P "8 weeks of radiation"  . Prostatitis    recurrent  . Sleep apnea   . Stroke [facility]) ~ 2005   denies residual on 2/123/2015  . Type II diabetes mellitus ([place])   . Umbilical hernia   . Urinary urgency    with incontinence    Patient Active Problem List   Diagnosis Date Noted  . Bell's palsy   . Facial droop   . Paroxysmal atrial fibrillation (H[facility])   . Embolic stroke ([place])   . Dysphagia 03/14/2015  . Dysarthria 03/14/2015  . Stroke ([place]) 03/14/2015  . Cellulitis 01/11/2014  . Lower extremity edema 01/11/2014  . Venous stasis dermatitis 01/11/2014  . DM type 2 with diabetic peripheral neuropathy ([employer]) 01/11/2014  . Cellulitis and abscess of leg 01/11/2014  . Cellulitis and abscess 01/11/2014  . Depression 06/15/2013  . GERD (gastroesophageal reflux disease) 06/15/2013  . Constipation 06/15/2013  . Diabetic neuropathy ([place]) 06/15/2013  . Type I  (juvenile type) diabetes mellitus with peripheral circulatory disorders, not stated as uncontrolled(250.71) 05/02/2013  . Essential hypertension, benign 04/07/2013  . AKI (acute kidney injury) ([place]) 04/04/2013  . Cellulitis of leg 04/04/2013  . Right hip pain 04/04/2013  . Renal insufficiency 12/16/2011  . History of CVA (cerebrovascular accident) 12/16/2011  . Dyslipidemia 12/16/2011  . Obesity (BMI 30-39.9) 12/16/2011  . Umbilical hernia with obstruction-partial on CT scan; easily reducible 12/14/2011  . History of PSVT (paroxysmal supraventricular tachycardia) 12/14/2011  . DM (diabetes mellitus) ([place]) 12/14/2011    Past Surgical History:  Procedure Laterality Date  . HERNIA REPAIR    . VASECTOMY    . VENTRAL HERNIA REPAIR  12/18/2011   Procedure: HERNIA REPAIR VENTRAL ADULT;  Surgeon: Adin Hector, MD;  Location: [facility];  Service: General;  Laterality: N/A;       Home Medications    Prior to Admission medications   Medication Sig Start Date End Date Taking? Authorizing Provider  amitriptyline (ELAVIL) 25 MG tablet Take 25 mg by mouth at bedtime.   Yes Historical Provider, MD  amoxicillin-clavulanate (AUGMENTIN) 875-125 MG tablet Take 1 tablet by mouth 2 (two) times daily. 11/29/16-12/09/16 11/29/16  Yes Historical Provider, MD  aspirin EC 81 MG tablet Take 81 mg by mouth daily.  Yes Historical Provider, MD  B Complex Vitamins (VITAMIN B COMPLEX PO) Take 1 tablet by mouth daily.   Yes Historical Provider, MD  buPROPion (WELLBUTRIN SR) 150 MG 12 hr tablet Take 150 mg by mouth daily. 11/17/16  Yes Historical Provider, MD  clopidogrel (PLAVIX) 75 MG tablet Take 75 mg by mouth daily.   Yes Historical Provider, MD  clotrimazole-betamethasone (LOTRISONE) lotion Apply 1 application topically 2 (two) times daily as needed (antifungal).    Yes Historical Provider, MD  fluticasone (FLONASE) 50 MCG/ACT nasal spray Place 1 spray into both nostrils daily. 03/17/15  Yes Kelvin Cellar, MD    furosemide (LASIX) 80 MG tablet Take 40-80 mg by mouth 2 (two) times daily. Take 80mg  in the am and then take 40mg  in the evening 11/23/16  Yes Historical Provider, MD  gabapentin (NEURONTIN) 300 MG capsule Take 300 mg by mouth 3 (three) times daily.   Yes Historical Provider, MD  HYDROcodone-acetaminophen (NORCO) 7.5-325 MG tablet Take 2 tablets by mouth 3 (three) times daily as needed for pain. 11/24/16  Yes Historical Provider, MD  insulin glargine (LANTUS) 100 UNIT/ML injection Inject 0.4 mLs (40 Units total) into the skin 2 (two) times daily. Patient taking differently: Inject 60 Units into the skin 3 (three) times daily.  03/17/15  Yes Kelvin Cellar, MD  metoprolol tartrate (LOPRESSOR) 25 MG tablet Take 1 tablet (25 mg total) by mouth 2 (two) times daily. 03/17/15  Yes Kelvin Cellar, MD  pantoprazole (PROTONIX) 40 MG tablet Take 40 mg by mouth daily.   Yes Historical Provider, MD  potassium chloride SA (K-DUR,KLOR-CON) 20 MEQ tablet Take 20 mEq by mouth daily.   Yes Historical Provider, MD  sennosides-docusate sodium (SENOKOT-S) 8.6-50 MG tablet Take 1 tablet by mouth daily.   Yes Historical Provider, MD  simvastatin (ZOCOR) 20 MG tablet Take 20 mg by mouth daily.   Yes Historical Provider, MD  terazosin (HYTRIN) 2 MG capsule Take 4 mg by mouth daily.   Yes Historical Provider, MD  terbinafine (LAMISIL) 250 MG tablet Take 250 mg by mouth daily. continous 11/08/16  Yes Historical Provider, MD  vitamin B-12 (CYANOCOBALAMIN) 1000 MCG tablet Take 1,000 mcg by mouth daily.   Yes Historical Provider, MD  zolpidem (AMBIEN) 5 MG tablet Take 1 tablet (5 mg total) by mouth at bedtime as needed for sleep. 03/18/15  Yes Tiffany L Reed, DO  apixaban (ELIQUIS) 5 MG TABS tablet Take 1 tablet (5 mg total) by mouth 2 (two) times daily. Patient not taking: Reported on 12/05/2016 03/17/15   Kelvin Cellar, MD  docusate sodium (COLACE) 100 MG capsule Take 1 capsule (100 mg total) by mouth 2 (two) times  daily. Patient taking differently: Take 100 mg by mouth 2 (two) times daily as needed for moderate constipation.  03/17/15   Kelvin Cellar, MD  furosemide (LASIX) 40 MG tablet Take 1 tablet (40 mg total) by mouth 2 (two) times daily. Take 40mg  in the AM, then take 20mg  at night Patient not taking: Reported on 12/05/2016 11/28/16   Tobie Poet, DO  HYDROcodone-acetaminophen (NORCO/VICODIN) 5-325 MG per tablet Take two tablets by mouth every 4 hours as needed for pain. Do not exceed 4gm of Tylenol in 24 hours Patient not taking: Reported on 12/05/2016 03/18/15   Tiffany L Reed, DO  predniSONE (STERAPRED UNI-PAK 21 TAB) 10 MG (21) TBPK tablet Take 60 mg PO q daily x 5 days then stop Patient not taking: Reported on 12/05/2016 03/17/15   Kelvin Cellar, MD  pregabalin (LYRICA) 50 MG capsule Take 1 capsule (50 mg total) by mouth 2 (two) times daily. Patient not taking: Reported on 12/05/2016 03/18/15   Tiffany L Reed, DO  valACYclovir (VALTREX) 1000 MG tablet Take 1 tablet (1,000 mg total) by mouth 3 (three) times daily. Patient not taking: Reported on 12/05/2016 03/17/15   Kelvin Cellar, MD    Family History Family History  Problem Relation Age of Onset  . Pneumonia Mother   . Heart attack Father     Social History Social History  Substance Use Topics  . Smoking status: Never Smoker  . Smokeless tobacco: Never Used  . Alcohol use No     Allergies   Flomax [tamsulosin hcl] and Glucophage [metformin hcl]   Review of Systems Review of Systems All other systems negative unless otherwise stated in HPI   Physical Exam Updated Vital Signs BP 145/74   Pulse 102   Temp 98.4 F (36.9 C) (Oral)   Resp 18   Ht 5\' 11"  (1.803 m)   Wt 125.2 kg   SpO2 97%   BMI 38.49 kg/m   Physical Exam  Constitutional: He is oriented to person, place, and time. He appears well-developed and well-nourished.  Non-toxic appearance. He does not have a sickly appearance. He does not appear ill.  HENT:  Head:  Normocephalic and atraumatic.  Mouth/Throat: Oropharynx is clear and moist.  Eyes: Conjunctivae are normal. Pupils are equal, round, and reactive to light.  Neck: Normal range of motion. Neck supple.  Cardiovascular: Normal rate and regular rhythm.   Pulmonary/Chest: Effort normal and breath sounds normal. No accessory muscle usage or stridor. No respiratory distress. He has no wheezes. He has no rhonchi. He has no rales.  Abdominal: Soft. Bowel sounds are normal. He exhibits no distension. There is no tenderness.  Musculoskeletal: Normal range of motion.  Lymphadenopathy:    He has no cervical adenopathy.  Neurological: He is alert and oriented to person, place, and time.  Speech clear without dysarthria.  Skin: Skin is warm and dry.  BLE venous stasis.  Weeping edema bilaterally with erythema.  Right with larger wound and erythema with warmth.  Mild warmth and induration.  No fluctuance.  Psychiatric: He has a normal mood and affect. His behavior is normal.    ED Treatments / Results  Labs (all labs ordered are listed, but only abnormal results are displayed) Labs Reviewed  CBC WITH DIFFERENTIAL/PLATELET - Abnormal; Notable for the following:       Result Value   WBC 13.5 (*)    Hemoglobin 11.4 (*)    HCT 36.1 (*)    MCV 68.8 (*)    MCH 21.7 (*)    RDW 15.8 (*)    Neutro Abs 10.5 (*)    Monocytes Absolute 1.4 (*)    All other components within normal limits  BASIC METABOLIC PANEL - Abnormal; Notable for the following:    Glucose, Bld 177 (*)    BUN 26 (*)    Creatinine, Ser 1.43 (*)    GFR calc non Af Amer 44 (*)    GFR calc Af Amer 51 (*)    All other components within normal limits  CULTURE, BLOOD (ROUTINE X 2)  CULTURE, BLOOD (ROUTINE X 2)  AEROBIC CULTURE (SUPERFICIAL SPECIMEN)  BRAIN NATRIURETIC PEPTIDE  I-STAT CG4 LACTIC ACID, ED    EKG  EKG Interpretation None       Radiology No results found.  Procedures Procedures (including critical care  time)  Medications Ordered in ED Medications  clindamycin (CLEOCIN) IVPB 600 mg (not administered)     Initial Impression / Assessment and Plan / ED Course  I have reviewed the triage vital signs and the nursing notes.  Pertinent labs & imaging results that were available during my care of the patient were reviewed by me and considered in my medical decision making (see chart for details).  Clinical Course    Patient presents with bilateral lower extremity edema with weeping and erythema. Afebrile, vitals stable.  Appears well, non-toxic or ill.  He denies any SOB, CP, or DOE.  Lungs CTAB.  No warmth or induration.  Likely related to venous stasis and weeping edema, no obvious signs of infection.  Patient is currently on abx BID for possible cellulitis, currently on day 4.  Lactic normal.  Labs show leukocytosis, 13.5.  BNP normal.  Given rising white count and worsening erythema and lower extremity swelling along with immunocomprised state (DM) I believe patient would benefit from admission for IV abx. Started on Clindamycin. Appreciate TRH.  Case has been discussed with and seen by Dr. Maryan Rued who agrees with the above plan for admission.    Final Clinical Impressions(s) / ED Diagnoses   Final diagnoses:  Bilateral lower extremity edema  Cellulitis of lower extremity, unspecified laterality    New Prescriptions New Prescriptions   No medications on file     Gloriann Loan, Hershal Coria 12/05/16 Zara Chess, MD 12/06/16 [phone]

## 2016-12-05 NOTE — Progress Notes (Signed)
PHARMACY NOTE -  ENOXAPARIN  Pharmacy has been consulted to assist with dosing of Enoxaparin for VTE Prophylaxis.  Height: 71 inches Weight: 125.2 kg BMI : 38 CrCl : 52 ml/min  Assesment:  Due to BMI > 30, adjustment of dosage per manufacturer guidelines to 0.5 mg/kg/q24h  Plan: Lovenox 60mg  sq q24h.   Need for further dosage adjustment appears unlikely at present.    Will sign off at this time.  Please reconsult if a change in clinical status warrants re-evaluation of dosage.  Leone Haven, PharmD 12/05/16 @ 22:39

## 2016-12-05 NOTE — ED Triage Notes (Signed)
Per EMS, patient is complaining of weeping edema in bilateral extremities since Friday. Patient was started on antibiotics on Friday ordered by his PCP. Patient is from home .

## 2016-12-05 NOTE — ED Notes (Signed)
Bed: [id number] Expected date:  Expected time:  Means of arrival:  Comments: EMS-[facility]E

## 2016-12-05 NOTE — ED Notes (Signed)
ED Provider at bedside. 

## 2016-12-06 ENCOUNTER — Observation Stay (HOSPITAL_BASED_OUTPATIENT_CLINIC_OR_DEPARTMENT_OTHER): Payer: Medicare Other

## 2016-12-06 ENCOUNTER — Other Ambulatory Visit: Payer: Self-pay | Admitting: *Deleted

## 2016-12-06 DIAGNOSIS — I872 Venous insufficiency (chronic) (peripheral): Secondary | ICD-10-CM | POA: Diagnosis present

## 2016-12-06 DIAGNOSIS — L0390 Cellulitis, unspecified: Secondary | ICD-10-CM | POA: Diagnosis not present

## 2016-12-06 DIAGNOSIS — Z6841 Body Mass Index (BMI) 40.0 and over, adult: Secondary | ICD-10-CM | POA: Diagnosis not present

## 2016-12-06 DIAGNOSIS — E1022 Type 1 diabetes mellitus with diabetic chronic kidney disease: Secondary | ICD-10-CM | POA: Diagnosis present

## 2016-12-06 DIAGNOSIS — N183 Chronic kidney disease, stage 3 unspecified: Secondary | ICD-10-CM | POA: Diagnosis present

## 2016-12-06 DIAGNOSIS — Z8546 Personal history of malignant neoplasm of prostate: Secondary | ICD-10-CM | POA: Diagnosis not present

## 2016-12-06 DIAGNOSIS — E785 Hyperlipidemia, unspecified: Secondary | ICD-10-CM | POA: Diagnosis present

## 2016-12-06 DIAGNOSIS — N401 Enlarged prostate with lower urinary tract symptoms: Secondary | ICD-10-CM | POA: Diagnosis present

## 2016-12-06 DIAGNOSIS — I10 Essential (primary) hypertension: Secondary | ICD-10-CM | POA: Diagnosis not present

## 2016-12-06 DIAGNOSIS — E7800 Pure hypercholesterolemia, unspecified: Secondary | ICD-10-CM | POA: Diagnosis present

## 2016-12-06 DIAGNOSIS — R600 Localized edema: Secondary | ICD-10-CM | POA: Diagnosis not present

## 2016-12-06 DIAGNOSIS — R0602 Shortness of breath: Secondary | ICD-10-CM | POA: Diagnosis not present

## 2016-12-06 DIAGNOSIS — L03116 Cellulitis of left lower limb: Secondary | ICD-10-CM | POA: Diagnosis present

## 2016-12-06 DIAGNOSIS — G4733 Obstructive sleep apnea (adult) (pediatric): Secondary | ICD-10-CM | POA: Diagnosis present

## 2016-12-06 DIAGNOSIS — J189 Pneumonia, unspecified organism: Secondary | ICD-10-CM | POA: Diagnosis not present

## 2016-12-06 DIAGNOSIS — E1142 Type 2 diabetes mellitus with diabetic polyneuropathy: Secondary | ICD-10-CM | POA: Diagnosis not present

## 2016-12-06 DIAGNOSIS — A4152 Sepsis due to Pseudomonas: Secondary | ICD-10-CM | POA: Diagnosis present

## 2016-12-06 DIAGNOSIS — E10649 Type 1 diabetes mellitus with hypoglycemia without coma: Secondary | ICD-10-CM | POA: Diagnosis present

## 2016-12-06 DIAGNOSIS — I5032 Chronic diastolic (congestive) heart failure: Secondary | ICD-10-CM | POA: Diagnosis present

## 2016-12-06 DIAGNOSIS — I878 Other specified disorders of veins: Secondary | ICD-10-CM | POA: Diagnosis present

## 2016-12-06 DIAGNOSIS — L03115 Cellulitis of right lower limb: Secondary | ICD-10-CM | POA: Diagnosis present

## 2016-12-06 DIAGNOSIS — R0600 Dyspnea, unspecified: Secondary | ICD-10-CM

## 2016-12-06 DIAGNOSIS — L03119 Cellulitis of unspecified part of limb: Secondary | ICD-10-CM | POA: Diagnosis not present

## 2016-12-06 DIAGNOSIS — J9601 Acute respiratory failure with hypoxia: Secondary | ICD-10-CM | POA: Diagnosis not present

## 2016-12-06 DIAGNOSIS — Z8673 Personal history of transient ischemic attack (TIA), and cerebral infarction without residual deficits: Secondary | ICD-10-CM | POA: Diagnosis not present

## 2016-12-06 DIAGNOSIS — I130 Hypertensive heart and chronic kidney disease with heart failure and stage 1 through stage 4 chronic kidney disease, or unspecified chronic kidney disease: Secondary | ICD-10-CM | POA: Diagnosis present

## 2016-12-06 DIAGNOSIS — K219 Gastro-esophageal reflux disease without esophagitis: Secondary | ICD-10-CM | POA: Diagnosis present

## 2016-12-06 DIAGNOSIS — H9190 Unspecified hearing loss, unspecified ear: Secondary | ICD-10-CM | POA: Diagnosis present

## 2016-12-06 DIAGNOSIS — I480 Paroxysmal atrial fibrillation: Secondary | ICD-10-CM | POA: Diagnosis not present

## 2016-12-06 DIAGNOSIS — L02415 Cutaneous abscess of right lower limb: Secondary | ICD-10-CM | POA: Diagnosis present

## 2016-12-06 DIAGNOSIS — E1051 Type 1 diabetes mellitus with diabetic peripheral angiopathy without gangrene: Secondary | ICD-10-CM | POA: Diagnosis not present

## 2016-12-06 LAB — CBC
HEMATOCRIT: 35.2 % — AB (ref 39.0–52.0)
HEMOGLOBIN: 11 g/dL — AB (ref 13.0–17.0)
MCH: 21.4 pg — ABNORMAL LOW (ref 26.0–34.0)
MCHC: 31.3 g/dL (ref 30.0–36.0)
MCV: 68.5 fL — ABNORMAL LOW (ref 78.0–100.0)
Platelets: 235 10*3/uL (ref 150–400)
RBC: 5.14 MIL/uL (ref 4.22–5.81)
RDW: 15.6 % — ABNORMAL HIGH (ref 11.5–15.5)
WBC: 12.4 10*3/uL — ABNORMAL HIGH (ref 4.0–10.5)

## 2016-12-06 LAB — GLUCOSE, CAPILLARY
GLUCOSE-CAPILLARY: 112 mg/dL — AB (ref 65–99)
GLUCOSE-CAPILLARY: 131 mg/dL — AB (ref 65–99)
GLUCOSE-CAPILLARY: 143 mg/dL — AB (ref 65–99)
GLUCOSE-CAPILLARY: 60 mg/dL — AB (ref 65–99)
Glucose-Capillary: 79 mg/dL (ref 65–99)
Glucose-Capillary: 226 mg/dL — ABNORMAL HIGH (ref 65–99)

## 2016-12-06 LAB — BASIC METABOLIC PANEL
ANION GAP: 6 (ref 5–15)
BUN: 22 mg/dL — ABNORMAL HIGH (ref 6–20)
CHLORIDE: 103 mmol/L (ref 101–111)
CO2: 30 mmol/L (ref 22–32)
Calcium: 8.8 mg/dL — ABNORMAL LOW (ref 8.9–10.3)
Creatinine, Ser: 1.33 mg/dL — ABNORMAL HIGH (ref 0.61–1.24)
GFR calc non Af Amer: 48 mL/min — ABNORMAL LOW (ref >60)
GFR, EST AFRICAN AMERICAN: 55 mL/min — AB (ref >60)
Glucose, Bld: 65 mg/dL (ref 65–99)
Potassium: 3.2 mmol/L — ABNORMAL LOW (ref 3.5–5.1)
Sodium: 139 mmol/L (ref 135–145)

## 2016-12-06 LAB — ECHOCARDIOGRAM COMPLETE
HEIGHTINCHES: 71 in
Weight: 4680.81 oz

## 2016-12-06 MED ORDER — POTASSIUM CHLORIDE CRYS ER 20 MEQ PO TBCR
40.0000 meq | EXTENDED_RELEASE_TABLET | ORAL | Status: AC
  Administered 2016-12-06: 40 meq via ORAL
  Filled 2016-12-06: qty 2

## 2016-12-06 MED ORDER — AMLODIPINE BESYLATE 5 MG PO TABS
2.5000 mg | ORAL_TABLET | Freq: Every day | ORAL | Status: DC
  Administered 2016-12-06 – 2016-12-10 (×5): 2.5 mg via ORAL
  Filled 2016-12-06 (×5): qty 1

## 2016-12-06 MED ORDER — PERFLUTREN LIPID MICROSPHERE
1.0000 mL | INTRAVENOUS | Status: AC | PRN
  Administered 2016-12-06: 3 mL via INTRAVENOUS
  Filled 2016-12-06: qty 10

## 2016-12-06 MED ORDER — PERFLUTREN LIPID MICROSPHERE
INTRAVENOUS | Status: AC
  Filled 2016-12-06: qty 10

## 2016-12-06 NOTE — Evaluation (Signed)
Physical Therapy Evaluation Patient Details Name: Jared Hall MRN: [id number] DOB: 1931-08-04 Today's Date: 12/06/2016   History of Present Illness  pt was admitted for cellulitis.  PMH significant for CVA, CHF, DM type 2, bil LE edema  Clinical Impression  The patient was lethargic, aroused and able to stand at the bedside with Rw and 2 assist. Pt admitted with above diagnosis. Pt currently with functional limitations due to the deficits listed below (see PT Problem List).  Pt will benefit from skilled PT to increase their independence and safety with mobility to allow discharge to the venue listed below.       Follow Up Recommendations Supervision/Assistance - 24 hour    Equipment Recommendations  None recommended by PT    Recommendations for Other Services       Precautions / Restrictions Precautions Precautions: Fall Restrictions Weight Bearing Restrictions: No      Mobility  Bed Mobility Overal bed mobility: Needs Assistance Bed Mobility: Rolling;Sidelying to Sit;Sit to Sidelying Rolling: Min assist;Mod assist Sidelying to sit: Min assist     Sit to sidelying: Min assist General bed mobility comments: pt used rails.  Assist for legs at bottom of feet  Transfers Overall transfer level: Needs assistance Equipment used: Rolling walker (2 wheeled) Transfers: Sit to/from Stand Sit to Stand: Min assist         General transfer comment: steadying assistance  Ambulation/Gait Ambulation/Gait assistance: Min assist;+2 safety/equipment           General Gait Details: 3-4 side steps along the bed  Stairs            Wheelchair Mobility    Modified Rankin (Stroke Patients Only)       Balance                                             Pertinent Vitals/Pain Pain Assessment: Faces Faces Pain Scale: No hurt Pain Location: bil LEs Pain Intervention(s): Limited activity within patient's tolerance;Monitored during session    Home  Living Family/patient expects to be discharged to:: Private residence Living Arrangements: Alone Available Help at Discharge: Family                  Prior Function Level of Independence: Independent with assistive device(s)         Comments: has a walker; daughter in law lives next door and does grocery shopping.  Pt was having difficulty getting sock on one foot     Hand Dominance        Extremity/Trunk Assessment   Upper Extremity Assessment Upper Extremity Assessment: Defer to OT evaluation    Lower Extremity Assessment Lower Extremity Assessment: RLE deficits/detail;LLE deficits/detail RLE Deficits / Details: weeping open wounds on lower legs, redness. bears weight LLE Deficits / Details: same    Cervical / Trunk Assessment Cervical / Trunk Assessment: Normal  Communication   Communication: HOH  Cognition Arousal/Alertness: Lethargic Behavior During Therapy: WFL for tasks assessed/performed Overall Cognitive Status: Within Functional Limits for tasks assessed                 General Comments: sleepy but arouses    General Comments      Exercises     Assessment/Plan    PT Assessment Patient needs continued PT services  PT Problem List Decreased strength;Decreased range of motion;Decreased mobility;Decreased knowledge of precautions;Decreased safety awareness;Decreased  knowledge of use of DME;Decreased activity tolerance;Decreased skin integrity          PT Treatment Interventions DME instruction;Gait training;Functional mobility training;Therapeutic activities;Patient/family education    PT Goals (Current goals can be found in the Care Plan section)  Acute Rehab PT Goals Patient Stated Goal: none stated PT Goal Formulation: Patient unable to participate in goal setting (lethargic at times) Time For Goal Achievement: 12/20/16 Potential to Achieve Goals: Fair    Frequency Min 3X/week   Barriers to discharge Decreased caregiver support       Co-evaluation PT/OT/SLP Co-Evaluation/Treatment: Yes Reason for Co-Treatment: For patient/therapist safety PT goals addressed during session: Mobility/safety with mobility OT goals addressed during session: ADL's and self-care       End of Session   Activity Tolerance: Patient tolerated treatment well Patient left: in bed;with call bell/phone within reach Nurse Communication: Mobility status    Functional Assessment Tool Used: clinical judgement Functional Limitation: Mobility: Walking and moving around Mobility: Walking and Moving Around Current Status [id number]): At least 60 percent but less than 80 percent impaired, limited or restricted Mobility: Walking and Moving Around Goal Status [phone]): At least 1 percent but less than 20 percent impaired, limited or restricted    Time: [id number] PT Time Calculation (min) (ACUTE ONLY): 30 min   Charges:   PT Evaluation $PT Eval Moderate Complexity: 1 Procedure     PT G Codes:   PT G-Codes **NOT FOR INPATIENT CLASS** Functional Assessment Tool Used: clinical judgement Functional Limitation: Mobility: Walking and moving around Mobility: Walking and Moving Around Current Status [id number]): At least 60 percent but less than 80 percent impaired, limited or restricted Mobility: Walking and Moving Around Goal Status [phone]): At least 1 percent but less than 20 percent impaired, limited or restricted    Claretha Cooper 12/06/2016, 4:27 PM Tresa Endo PT [phone]

## 2016-12-06 NOTE — Progress Notes (Signed)
Dressing changes complete to bil lower legs as ordered, tol well.Tenderness to touch, Prevalon boots placed as ordered. SRP, RN

## 2016-12-06 NOTE — Consult Note (Signed)
   [facility] CM Inpatient Consult   12/06/2016  BILLY FORCE 08/11/1931 [id number]    Made aware of hospital admission by [facility] RNCM. Mr. Aloisi is active with [place] Management program. Please see detailed patient outreach notes by [place] under chart review then notes tab. Will continue to follow along and make inpatient RNCM aware that Mr. Wineman is active with [place] Management services.    Marthenia Rolling, MSN-Ed, RN,BSN [facility] Liaison [phone]

## 2016-12-06 NOTE — Evaluation (Signed)
Occupational Therapy Evaluation Patient Details Name: Jared Hall MRN: [id number] DOB: [birth year] Today's Date: 12/06/2016    History of Present Illness pt was admitted for cellulitis.  PMH significant for CVA, CHF, DM type 2, bil LE edema   Clinical Impression   This 83 year old man was admitted for the above. He will benefit from continued OT.  Pt is usually  Mod I with adls.  He currently needs up to mod A for adls.      Follow Up Recommendations  SNF (or assistance at home for adls/mobility and HHOT, ?progress)    Equipment Recommendations  3 in 1 bedside commode    Recommendations for Other Services       Precautions / Restrictions Precautions Precautions: Fall Restrictions Weight Bearing Restrictions: No      Mobility Bed Mobility Overal bed mobility: Needs Assistance Bed Mobility: Rolling;Sidelying to Sit;Sit to Sidelying Rolling: Min assist;Mod assist Sidelying to sit: Min assist     Sit to sidelying: Min assist General bed mobility comments: pt used rails.  Assist for legs at bottom of feet  Transfers Overall transfer level: Needs assistance Equipment used: Rolling walker (2 wheeled) Transfers: Sit to/from Stand Sit to Stand: Min assist         General transfer comment: steadying assistance    Balance                                            ADL Overall ADL's : Needs assistance/impaired     Grooming: Set up;Sitting   Upper Body Bathing: Set up;Sitting   Lower Body Bathing: Moderate assistance;Sit to/from stand   Upper Body Dressing : Set up;Sitting   Lower Body Dressing: Moderate assistance;Sit to/from stand                 General ADL Comments: pt stood and sidestepped up [place].  Pt's ADLs limited by pain     Vision     Perception     Praxis      Pertinent Vitals/Pain Pain Assessment: Faces Faces Pain Scale: Hurts whole lot Pain Location: bil LEs Pain Intervention(s): Limited activity within  patient's tolerance;Monitored during session;Repositioned     Hand Dominance     Extremity/Trunk Assessment Upper Extremity Assessment Upper Extremity Assessment: Generalized weakness (grossly 4-/5, R slightly stronger)   Lower Extremity Assessment Lower Extremity Assessment:  (weeping wounds with compromised skin)       Communication Communication Communication: HOH   Cognition Arousal/Alertness: Awake/alert (pt initially sleepy then aroused) Behavior During Therapy: WFL for tasks assessed/performed Overall Cognitive Status: Within Functional Limits for tasks assessed                     General Comments       Exercises       Shoulder Instructions      Home Living Family/patient expects to be discharged to:: Private residence Living Arrangements: Alone Available Help at Discharge: Family               Bathroom Shower/Tub: Walk-in Psychologist, prison and probation services: Standard                Prior Functioning/Environment Level of Independence: Independent with assistive device(s)        Comments: has a walker; daughter in law lives next door and does grocery shopping.  Pt was having  difficulty getting sock on one foot        OT Problem List: Decreased activity tolerance;Pain   OT Treatment/Interventions: Self-care/ADL training;DME and/or AE instruction;Balance training;Patient/family education;Therapeutic activities    OT Goals(Current goals can be found in the care plan section) Acute Rehab OT Goals Patient Stated Goal: none stated OT Goal Formulation: With patient Time For Goal Achievement: 12/13/16 Potential to Achieve Goals: Good ADL Goals Pt Will Perform Grooming: with supervision;standing Pt Will Transfer to Toilet: with supervision;ambulating;bedside commode Pt Will Perform Toileting - Clothing Manipulation and hygiene: with supervision;sit to/from stand Additional ADL Goal #1: pt will perform bed mobility at supervision level in  preparation for adls  OT Frequency: Min 2X/week   Barriers to D/C:            Co-evaluation PT/OT/SLP Co-Evaluation/Treatment: Yes Reason for Co-Treatment: For patient/therapist safety PT goals addressed during session: Mobility/safety with mobility OT goals addressed during session: ADL's and self-care      End of Session    Activity Tolerance: Patient tolerated treatment well Patient left: in bed;with call bell/phone within reach;with nursing/sitter in room   Time: [id number] OT Time Calculation (min): 30 min Charges:  OT General Charges $OT Visit: 1 Procedure OT Evaluation $OT Eval Low Complexity: 1 Procedure G-Codes: OT G-codes **NOT FOR INPATIENT CLASS** Functional Assessment Tool Used: clinical judgement Functional Limitation: Self care Self Care Current Status [id number]): At least 20 percent but less than 40 percent impaired, limited or restricted Self Care Goal Status [id number]): At least 1 percent but less than 20 percent impaired, limited or restricted  Jared Hall 12/06/2016, 3:55 PM Lesle Chris, OTR/L [phone] 12/06/2016

## 2016-12-06 NOTE — Consult Note (Signed)
[facility] Nurse wound consult note Reason for Consult:Bilateral LE cellulitis, partial thickness skin loss Wound type:Infectious, venous insufficiency Pressure Injury POA: No Measurement: Right anterior and lateral LE with affected area measuring 12cm x 14cm. Partial thickness wound measuring 2cm x 3cm x 0.1cm in the proximal area and a distal linear wound measuring 0.2cm x 7cm x 0.1cm. Dried light yellow exudate is noted.Pink, moist wound beds. Left anterior LE with affected area measuring 10cm x 15cm with  Partial thickness, pinpoint openings.  No drainage. Wound bed: As described above Drainage (amount, consistency, odor) As described abovel Periwound: Erythematous, warm, firm. Dressing procedure/placement/frequency: I will provide Nursing with guidance via the Orders for topical care:  Cleanse with NS, pat dry, cover with xeroform gauze for its astringent and antimicrobial properties.  Top with Kerlix wrapped from toes to knee, followed by ACE bandage for light compression wrapped in a similar fashion. We will perform twice daily to monitor the degree of erythema.  Both LEs will be placed in pressure redistribution heel boots for elevation and pressure injury prevention. Additionally, we will place a sacral foam dressing for pressure injury prevention due to patient preferring the supine position bed despite being taught to turn and reposition side to side. [place] nursing team will not follow, but will remain available to this patient, the nursing and medical teams.  Please re-consult if needed. Thank you for inviting [place] to consult on this patient. Maudie Flakes, MSN, RN, [place], Arther Abbott  Pager# [phone]

## 2016-12-06 NOTE — Care Management Note (Signed)
Case Management Note  Patient Details  Name: Jared Hall MRN: [id number] Date of Birth: [birth year]  Subjective/Objective: Admitted with Celluliits                   Action/Plan: Plan to discharge home with [facility]   Expected Discharge Date:                  Expected Discharge Plan:  [name]  In-House Referral:     Discharge planning Services  CM Consult  Post Acute Care Choice:  Home Health Choice offered to:  Adult Children  DME Arranged:   (pt has lift chair and 4 wheel walker at home) DME Agency:  NA  HH Arranged:  RN, PT, NA HH Agency:  [name]  Status of Service:  In process, will continue to follow  If discussed at Long Length of Stay Meetings, dates discussed:    Additional CommentsPurcell Mouton, RN 12/06/2016, 11:50 AM

## 2016-12-06 NOTE — Progress Notes (Signed)
PROGRESS NOTE    Jared Hall  [id number] DOB: 03/29/1931 DOA: 12/05/2016 PCP: Henrine Screws, MD    Brief Narrative:   83 y.o. male with medical history significant of HTN, HLD, CVA, CHF last EF showed 55% in 02/2015, DM type II, b/l lower extremity edema; who presents with worsening lower extremity swelling. Patient was sent to the ED 7 days ago for reports of his right fifth digit turning black. He was recommended to increase Lasix to 40 mg twice daily. 4 days ago he saw his PCP who started him on Augmentin which she's been taking twice daily as prescribed. He reports that he noticed that his leg started to weep yesterday. His home health nurses evaluated him reported worsening erythema and drainage from the legs. Associated symptoms include worsening dyspnea with exertion, orthopnea, and weight gain. He denies having any fevers, chills, or chest pain.  Upon admission patient was evaluated and seen to be afebrile, heart rates up to 107, and all other vitals relatively within normal limits. Patient was started on clindamycin in the ED . Blood and wound cultures obtained.  Assessment & Plan:   Principal Problem:   Cellulitis Active Problems:   History of CVA (cerebrovascular accident)   Essential hypertension, benign   Type 1 diabetes mellitus with peripheral circulatory complications (HCC)   Paroxysmal atrial fibrillation (HCC)   Chronic kidney disease, stage III (moderate)   Cellulitis of B LE with sepsis present on admission:  -Patient presents tachycardic, tachypneic, WBC of 13.5, and lower extremity redness and swelling. Cultures obtained in ED. Patient started on clindamycin and area of erythema marked. - cultures obtained, pending - For now, will continue clindamycin  - Wound care consulted, appreciate input  Bilateral lower extremity swelling, likely secondary to venous stasis - Elevate bilateral lower extremity - 2d echo done, pending results - Patient was given 40  mg of Lasix IV 1 dose - Hold off on further diuretics for now  Dyspnea - Continuous pulse oximetry with nasal cannula oxygen as needed. - Albuterol nebs prn shortness of breath/wheezing - Stable at present  Chronic kidney disease stage III:  -Cr has improved to 1.33 -Repeat BMET in AM  Diabetes mellitus type 1 - Decreased home Lantus from 60 units three times daily to 40 units twice a day while hospitalized in a more controlled setting.  - Will continue SSI coverage  CAD/history of CVA - For now, will continue Plavix and aspirin  History of PSVT/ PAF:  -Eliquis was reported to be discontinued and patient was started on Plavix. -Stable at this time  Essential hypertension  - Continue metoprolol -Pt stable, albeit suboptimally controlled -Will add low dose Ca channel blocker   Hyperlipidemia - Continue simvastatin  - Stable at present  DVT prophylaxis: Lovenox subQ Code Status: Full Family Communication: Pt in room, family not at bedside Disposition Plan: Uncertain at this time  Consultants:    Procedures:     Antimicrobials: Anti-infectives    Start     Dose/Rate Route Frequency Ordered Stop   12/06/16 1000  terbinafine (LAMISIL) tablet 250 mg    Comments:  continous     250 mg Oral Daily 12/05/16 2229     12/06/16 0300  clindamycin (CLEOCIN) IVPB 600 mg     600 mg 100 mL/hr over 30 Minutes Intravenous Every 8 hours 12/05/16 2229     12/05/16 1900  clindamycin (CLEOCIN) IVPB 600 mg     600 mg 100 mL/hr over 30 Minutes Intravenous  Once 12/05/16 1859 12/05/16 2010      Subjective: No complaints at this time  Objective: Vitals:   12/05/16 2230 12/05/16 2256 12/06/16 0000 12/06/16 0648  BP: 160/84 160/94 (!) 164/68 (!) 142/59  Pulse: 111 109 (!) 113 95  Resp:  18 (!) 22 (!) 22  Temp:  98.4 F (36.9 C) 99.3 F (37.4 C) 98.7 F (37.1 C)  TempSrc:  Oral Oral Oral  SpO2: 95% 96% 95% 95%  Weight:   132.7 kg (292 lb 8.8 oz)   Height:   5\' 11"   (1.803 m)     Intake/Output Summary (Last 24 hours) at 12/06/16 1429 Last data filed at 12/06/16 0700  Gross per 24 hour  Intake              290 ml  Output              800 ml  Net             -510 ml   Filed Weights   12/05/16 1707 12/06/16 0000  Weight: 125.2 kg (276 lb) 132.7 kg (292 lb 8.8 oz)    Examination:  General exam: Appears calm and comfortable  Respiratory system: Clear to auscultation. Respiratory effort normal. Cardiovascular system: S1 & S2 heard, RRR Gastrointestinal system: Abdomen is nondistended, soft and nontender. No organomegaly or masses felt. Normal bowel sounds heard. Central nervous system: Alert and oriented. No focal neurological deficits. Extremities: Symmetric 5 x 5 power. Skin: B LE venous stasis skin changes, B skin ulcerations with surrounding erythema Psychiatry: Judgement and insight appear normal. Mood & affect appropriate.   Data Reviewed: I have personally reviewed following labs and imaging studies  CBC:  Recent Labs Lab 12/05/16 1741 12/06/16 0539  WBC 13.5* 12.4*  NEUTROABS 10.5*  --   HGB 11.4* 11.0*  HCT 36.1* 35.2*  MCV 68.8* 68.5*  PLT 240 [id number]   Basic Metabolic Panel:  Recent Labs Lab 12/05/16 1741 12/06/16 0539  NA 139 139  K 3.7 3.2*  CL 101 103  CO2 31 30  GLUCOSE 177* 65  BUN 26* 22*  CREATININE 1.43* 1.33*  CALCIUM 9.2 8.8*   GFR: Estimated Creatinine Clearance: 58.5 mL/min (by C-G formula based on SCr of 1.33 mg/dL (H)). Liver Function Tests: No results for input(s): AST, ALT, ALKPHOS, BILITOT, PROT, ALBUMIN in the last 168 hours. No results for input(s): LIPASE, AMYLASE in the last 168 hours. No results for input(s): AMMONIA in the last 168 hours. Coagulation Profile: No results for input(s): INR, PROTIME in the last 168 hours. Cardiac Enzymes: No results for input(s): CKTOTAL, CKMB, CKMBINDEX, TROPONINI in the last 168 hours. BNP (last 3 results) No results for input(s): PROBNP in the last 8760  hours. HbA1C: No results for input(s): HGBA1C in the last 72 hours. CBG:  Recent Labs Lab 12/06/16 0005 12/06/16 0824 12/06/16 0850 12/06/16 1224  GLUCAP 112* 60* 79 131*   Lipid Profile: No results for input(s): CHOL, HDL, LDLCALC, TRIG, CHOLHDL, LDLDIRECT in the last 72 hours. Thyroid Function Tests: No results for input(s): TSH, T4TOTAL, FREET4, T3FREE, THYROIDAB in the last 72 hours. Anemia Panel: No results for input(s): VITAMINB12, FOLATE, FERRITIN, TIBC, IRON, RETICCTPCT in the last 72 hours. Sepsis Labs:  Recent Labs Lab 12/05/16 1752 12/05/16 2156  LATICACIDVEN 1.24 1.36    Recent Results (from the past 240 hour(s))  Wound or Superficial Culture     Status: None (Preliminary result)   Collection Time: 12/05/16  7:20 PM  Result Value Ref Range Status   Specimen Description LEG RIGHT  Final   Special Requests NONE  Final   Gram Stain   Final    FEW WBC PRESENT, PREDOMINANTLY PMN NO ORGANISMS SEEN    Culture   Final    TOO YOUNG TO READ Performed at [facility], [address], [place], [place] [address]    Report Status PENDING  Incomplete     Radiology Studies: Dg Chest Port 1 View  Result Date: 12/05/2016 CLINICAL DATA:  Shortness of breath.  Bilateral leg swelling. EXAM: PORTABLE CHEST 1 VIEW COMPARISON:  11/28/2016 FINDINGS: Cardiomegaly with tortuous atherosclerotic thoracic aorta, stable from prior exam. Left lung scarring is again seen. Streaky right infrahilar opacities favoring atelectasis. No evidence pulmonary edema. No large pleural effusion allowing for limited left lung base assessment due to habitus and portable technique. No pneumothorax. IMPRESSION: 1. Stable cardiomegaly and left lung scarring. 2. Right infrahilar atelectasis. 3. Thoracic aortic atherosclerosis. Electronically Signed   By: Jeb Levering M.D.   On: 12/05/2016 22:08    Scheduled Meds: . amitriptyline  25 mg Oral QHS  . amLODipine  2.5 mg Oral Daily  . aspirin EC   81 mg Oral Daily  . buPROPion  150 mg Oral Daily  . clindamycin (CLEOCIN) IV  600 mg Intravenous Q8H  . clopidogrel  75 mg Oral Q breakfast  . enoxaparin (LOVENOX) injection  60 mg Subcutaneous QHS  . fluticasone  1 spray Each Nare Daily  . gabapentin  300 mg Oral TID  . insulin aspart  0-15 Units Subcutaneous TID WC  . insulin aspart  0-5 Units Subcutaneous QHS  . insulin glargine  40 Units Subcutaneous BID  . metoprolol tartrate  25 mg Oral BID  . pantoprazole  40 mg Oral Daily  . potassium chloride SA  20 mEq Oral Daily  . senna-docusate  1 tablet Oral Daily  . simvastatin  20 mg Oral Daily  . terazosin  4 mg Oral Daily  . terbinafine  250 mg Oral Daily   Continuous Infusions:   LOS: 0 days   Gerianne Simonet, Orpah Melter, MD [facility] Pager [phone]  If 7PM-7AM, please contact night-coverage [email or web address] Password [facility] 12/06/2016, 2:29 PM

## 2016-12-06 NOTE — Progress Notes (Signed)
  Echocardiogram 2D Echocardiogram has been performed.  Jared Hall M 12/06/2016, 9:47 AM

## 2016-12-06 NOTE — Patient Outreach (Signed)
[facility]) Care Management [place] Telephone Outreach 12/06/2016  ALGIRD RIETZ Sep 09, 1931 [id number]   Successful telephone outreach from [facility], daughter-in-law/ caregiver (on [facility] CM written consent), of Jared Hall Hinesis an 83 y.o.malereferred to [place] from [place] CM, who received initial high risk referral from MD office. Patient has history including, but not limited to CHF, DM, HTN, HLD, CVA, CKD, paroxysmal AF. HIPAA/ identity verified today by phone with [name].   [name] placed call to me today to inform me that Mr. Linders had to be taken back to the ED last night.  States that after patient was discharged home from ED visit 11/28/16, his PCP put him on antibiotics and started home health [facility]) services for leg wrapping.  [name] reports patient has been taking antibiotics as prescribed by his PCP.  [name] states that [facility] nurse visited patient yesterday and was again concerned about increased leg swelling, erythema, and oozy weeping; as well as increased shortness of breath.  [facility] nurse recommended that patient go back to ED for further evaluation.  [name] reports patient was admitted as result of ED visit 12/05/16, which was verified by review of EMR.  [name] reports today that she has called the hospital for a general update on patient status, as the snowy weather is preventing her from leaving her home; [name] states that hospital staff will not give her information on patient status, citing HIPAA concerns.  I verified through review of EMR that [name] is listed as an emergency contact for patient, and advised her to call hospital staff back and request to speak with charge nurse/ administration to obtain patient update/ plan of care, as [name] and her husband [name] (patient's son) are very concerned about his current status.  [name] agreed that she will again reach out to hospital staff for patient status update.  [name] agreed to contact [facility] RN CM if she has further  questions or concerns.  We cancelled previously scheduled next [place] in-home visit for later this week during our call today.   [name] denies further questions, issues, concerns or problems today.  Plan:  Mr. Daman caregiver/ daughter-in-law will communicate with hospital staff for update on patient status, and will notify [facility] RN CM if she has any questions or issues.  I will notify [place] of patient admission, and will follow patient progress during current hospitalization.   [name], RN, BSN, [employer] [facility] Care Management  [phone]

## 2016-12-06 NOTE — Progress Notes (Signed)
Inpatient Diabetes Program Recommendations  AACE/ADA: New Consensus Statement on Inpatient Glycemic Control (2015)  Target Ranges:  Prepandial:   less than 140 mg/dL      Peak postprandial:   less than 180 mg/dL (1-2 hours)      Critically ill patients:  140 - 180 mg/dL   Results for Jared Hall, Jared Hall (MRN [id number]) as of 12/06/2016 09:48  Ref. Range 12/06/2016 00:05 12/06/2016 08:24 12/06/2016 08:50  Glucose-Capillary Latest Ref Range: 65 - 99 mg/dL 112 (H) 60 (L) 74    Admit with: Sepsis/ Cellulitis LE  History: DM, CVA, CKD  Home DM Meds: Lantus 60 units TID  Current Insulin Orders: Lantus 40 units BID      Novolog Moderate Correction Scale/ SSI (0-15 units) TID AC + HS        MD- Note patient received 40 units Lantus last night at 11pm.  Hypoglycemic this AM: CBG 60 mg/dl.  If this trend continues, please consider reducing Lantus to 15 units BID     --Will follow patient during hospitalization--  Wyn Quaker RN, MSN, CDE Diabetes Coordinator Inpatient Glycemic Control Team Team Pager: [phone] (8a-5p)

## 2016-12-07 ENCOUNTER — Ambulatory Visit: Payer: Self-pay | Admitting: *Deleted

## 2016-12-07 LAB — GLUCOSE, CAPILLARY
GLUCOSE-CAPILLARY: 152 mg/dL — AB (ref 65–99)
Glucose-Capillary: 122 mg/dL — ABNORMAL HIGH (ref 65–99)
Glucose-Capillary: 155 mg/dL — ABNORMAL HIGH (ref 65–99)

## 2016-12-07 LAB — BASIC METABOLIC PANEL
Anion gap: 7 (ref 5–15)
BUN: 22 mg/dL — AB (ref 6–20)
CHLORIDE: 105 mmol/L (ref 101–111)
CO2: 29 mmol/L (ref 22–32)
CREATININE: 1.24 mg/dL (ref 0.61–1.24)
Calcium: 8.9 mg/dL (ref 8.9–10.3)
GFR calc Af Amer: >60 mL/min (ref >60)
GFR calc non Af Amer: 52 mL/min — ABNORMAL LOW (ref >60)
GLUCOSE: 136 mg/dL — AB (ref 65–99)
POTASSIUM: 4.2 mmol/L (ref 3.5–5.1)
Sodium: 141 mmol/L (ref 135–145)

## 2016-12-07 LAB — URINALYSIS, ROUTINE W REFLEX MICROSCOPIC
Bilirubin Urine: NEGATIVE
GLUCOSE, UA: 50 mg/dL — AB
HGB URINE DIPSTICK: NEGATIVE
KETONES UR: NEGATIVE mg/dL
Leukocytes, UA: NEGATIVE
Nitrite: NEGATIVE
PH: 6 (ref 5.0–8.0)
Protein, ur: NEGATIVE mg/dL
Specific Gravity, Urine: 1.018 (ref 1.005–1.030)

## 2016-12-07 LAB — CBC
HEMATOCRIT: 35.3 % — AB (ref 39.0–52.0)
Hemoglobin: 10.9 g/dL — ABNORMAL LOW (ref 13.0–17.0)
MCH: 21.2 pg — AB (ref 26.0–34.0)
MCHC: 30.9 g/dL (ref 30.0–36.0)
MCV: 68.8 fL — AB (ref 78.0–100.0)
PLATELETS: 251 10*3/uL (ref 150–400)
RBC: 5.13 MIL/uL (ref 4.22–5.81)
RDW: 15.6 % — AB (ref 11.5–15.5)
WBC: 11 10*3/uL — ABNORMAL HIGH (ref 4.0–10.5)

## 2016-12-07 MED ORDER — IPRATROPIUM-ALBUTEROL 0.5-2.5 (3) MG/3ML IN SOLN
3.0000 mL | Freq: Three times a day (TID) | RESPIRATORY_TRACT | Status: DC
  Administered 2016-12-08 (×3): 3 mL via RESPIRATORY_TRACT
  Filled 2016-12-07 (×3): qty 3

## 2016-12-07 MED ORDER — IPRATROPIUM-ALBUTEROL 0.5-2.5 (3) MG/3ML IN SOLN
3.0000 mL | Freq: Four times a day (QID) | RESPIRATORY_TRACT | Status: DC
  Administered 2016-12-07: 3 mL via RESPIRATORY_TRACT
  Filled 2016-12-07: qty 3

## 2016-12-07 MED ORDER — INSULIN GLARGINE 100 UNIT/ML ~~LOC~~ SOLN
45.0000 [IU] | Freq: Two times a day (BID) | SUBCUTANEOUS | Status: DC
  Administered 2016-12-07 – 2016-12-10 (×7): 45 [IU] via SUBCUTANEOUS
  Filled 2016-12-07 (×8): qty 0.45

## 2016-12-07 MED ORDER — PREDNISONE 20 MG PO TABS
60.0000 mg | ORAL_TABLET | Freq: Every day | ORAL | Status: DC
  Administered 2016-12-07 – 2016-12-13 (×6): 60 mg via ORAL
  Filled 2016-12-07 (×7): qty 3

## 2016-12-07 MED ORDER — DEXTROSE 5 % IV SOLN
2.0000 g | Freq: Three times a day (TID) | INTRAVENOUS | Status: DC
  Administered 2016-12-07 – 2016-12-10 (×8): 2 g via INTRAVENOUS
  Filled 2016-12-07 (×10): qty 2

## 2016-12-07 MED ORDER — IPRATROPIUM-ALBUTEROL 0.5-2.5 (3) MG/3ML IN SOLN
3.0000 mL | RESPIRATORY_TRACT | Status: DC | PRN
  Administered 2016-12-09: 3 mL via RESPIRATORY_TRACT
  Filled 2016-12-07 (×2): qty 3

## 2016-12-07 NOTE — Progress Notes (Signed)
PROGRESS NOTE    Jared Hall  [id number] DOB: [birth year] DOA: 12/05/2016 PCP: Henrine Screws, MD    Brief Narrative:   83 y.o. male with medical history significant of HTN, HLD, CVA, CHF last EF showed 55% in 02/2015, DM type II, b/l lower extremity edema; who presents with worsening lower extremity swelling. Patient was sent to the ED 7 days ago for reports of his right fifth digit turning black. He was recommended to increase Lasix to 40 mg twice daily. 4 days ago he saw his PCP who started him on Augmentin which she's been taking twice daily as prescribed. He reports that he noticed that his leg started to weep yesterday. His home health nurses evaluated him reported worsening erythema and drainage from the legs. Associated symptoms include worsening dyspnea with exertion, orthopnea, and weight gain. He denies having any fevers, chills, or chest pain.  Upon admission patient was evaluated and seen to be afebrile, heart rates up to 107, and all other vitals relatively within normal limits. Patient was started on clindamycin in the ED . Blood and wound cultures obtained.  Assessment & Plan:   Principal Problem:   Cellulitis Active Problems:   History of CVA (cerebrovascular accident)   Essential hypertension, benign   Type 1 diabetes mellitus with peripheral circulatory complications (HCC)   Paroxysmal atrial fibrillation (HCC)   Chronic kidney disease, stage III (moderate)   Cellulitis of B LE with sepsis present on admission:  -Patient presents tachycardic, tachypneic, WBC of 13.5, and lower extremity redness and swelling. Cultures obtained in ED. Patient started on clindamycin and area of erythema marked. - cultures obtained, demonstrating abundant pseudomonas species -Patient had been continued on clindamycin empirically - For now, will transition to ceftaz for better pseudomonas coverage - Wound care consulted, appreciate input - Follow final wound cx  sensitivities  Bilateral lower extremity swelling, likely secondary to chronic venous stasis - Elevate bilateral lower extremity - 2d echo done, normal LVEF without significant stensis - Patient was given 40 mg of Lasix IV 1 dose - Hold off on further diuretics for now - Family reports pt has been noncompliant with keeping LE elevated prior to admission  Dyspnea - Continuous pulse oximetry with nasal cannula oxygen as needed. - decreased BS on exam, trace wheezing on auscultation - Will schedule duonebs q6hrs, q2prn - Will start prednisone 60mg  daily  Chronic kidney disease stage III:  -Cr has improved to 1.24 -Recheck bmet in AM  Diabetes mellitus type 1 - Decreased home Lantus from 60 units three times daily to 40 units twice a day while hospitalized in a more controlled setting.  - As steroids are being started, will increase lantus to 45 units bid - Will continue SSI coverage  CAD/history of CVA - For now, will continue Plavix and aspirin - Stable for now  History of PSVT/ PAF:  -Eliquis was reported to be discontinued and patient was started on Plavix. -Remains stable  Essential hypertension  - Continue metoprolol as tolerated - BP improved with addition of low dose Ca channel blocker   Hyperlipidemia - Continue simvastatin as toelrated - remains stable  DVT prophylaxis: Lovenox subQ Code Status: Full Family Communication: Pt in room, family not at bedside Disposition Plan: Uncertain at this time  Consultants:    Procedures:     Antimicrobials: Anti-infectives    Start     Dose/Rate Route Frequency Ordered Stop   12/06/16 1000  terbinafine (LAMISIL) tablet 250 mg    Comments:  continous     250 mg Oral Daily 12/05/16 2229     12/06/16 0300  clindamycin (CLEOCIN) IVPB 600 mg     600 mg 100 mL/hr over 30 Minutes Intravenous Every 8 hours 12/05/16 2229     12/05/16 1900  clindamycin (CLEOCIN) IVPB 600 mg     600 mg 100 mL/hr over 30 Minutes  Intravenous  Once 12/05/16 1859 12/05/16 2010      Subjective: Reports feeling somewhat better  Objective: Vitals:   12/06/16 2347 12/07/16 0632 12/07/16 0945 12/07/16 1441  BP: 139/66 132/66  (!) 150/69  Pulse: 95 98  89  Resp: 20 20  16   Temp: 98.8 F (37.1 C) 98.5 F (36.9 C)  98.3 F (36.8 C)  TempSrc: Oral Oral  Oral  SpO2: 95% 96% 95% 97%  Weight:      Height:        Intake/Output Summary (Last 24 hours) at 12/07/16 1720 Last data filed at 12/07/16 1449  Gross per 24 hour  Intake              150 ml  Output             1525 ml  Net            -1375 ml   Filed Weights   12/05/16 1707 12/06/16 0000  Weight: 125.2 kg (276 lb) 132.7 kg (292 lb 8.8 oz)    Examination:  General exam: Laying in bed, in nad Respiratory system: normal resp effort, no audible wheezing Cardiovascular system: regular rate, s1, s2 Gastrointestinal system: Soft, nondistended, pos bs Central nervous system: CN2-12 grossly intact, strength intact Extremities: Perfused, no clubbing Skin: normal skin turgor, no notable skin lesions seen Psychiatry: Mood normal// no visual hallucinations   Data Reviewed: I have personally reviewed following labs and imaging studies  CBC:  Recent Labs Lab 12/05/16 1741 12/06/16 0539 12/07/16 0536  WBC 13.5* 12.4* 11.0*  NEUTROABS 10.5*  --   --   HGB 11.4* 11.0* 10.9*  HCT 36.1* 35.2* 35.3*  MCV 68.8* 68.5* 68.8*  PLT 240 235 [id number]   Basic Metabolic Panel:  Recent Labs Lab 12/05/16 1741 12/06/16 0539 12/07/16 0536  NA 139 139 141  K 3.7 3.2* 4.2  CL 101 103 105  CO2 31 30 29   GLUCOSE 177* 65 136*  BUN 26* 22* 22*  CREATININE 1.43* 1.33* 1.24  CALCIUM 9.2 8.8* 8.9   GFR: Estimated Creatinine Clearance: 62.8 mL/min (by C-G formula based on SCr of 1.24 mg/dL). Liver Function Tests: No results for input(s): AST, ALT, ALKPHOS, BILITOT, PROT, ALBUMIN in the last 168 hours. No results for input(s): LIPASE, AMYLASE in the last 168 hours. No  results for input(s): AMMONIA in the last 168 hours. Coagulation Profile: No results for input(s): INR, PROTIME in the last 168 hours. Cardiac Enzymes: No results for input(s): CKTOTAL, CKMB, CKMBINDEX, TROPONINI in the last 168 hours. BNP (last 3 results) No results for input(s): PROBNP in the last 8760 hours. HbA1C: No results for input(s): HGBA1C in the last 72 hours. CBG:  Recent Labs Lab 12/06/16 1654 12/06/16 2149 12/07/16 0827 12/07/16 1208 12/07/16 1644  GLUCAP 143* 226* 122* 155* 152*   Lipid Profile: No results for input(s): CHOL, HDL, LDLCALC, TRIG, CHOLHDL, LDLDIRECT in the last 72 hours. Thyroid Function Tests: No results for input(s): TSH, T4TOTAL, FREET4, T3FREE, THYROIDAB in the last 72 hours. Anemia Panel: No results for input(s): VITAMINB12, FOLATE, FERRITIN, TIBC, IRON, RETICCTPCT in the last  72 hours. Sepsis Labs:  Recent Labs Lab 12/05/16 1752 12/05/16 2156  LATICACIDVEN 1.24 1.36    Recent Results (from the past 240 hour(s))  Blood culture (routine x 2)     Status: None (Preliminary result)   Collection Time: 12/05/16  5:45 PM  Result Value Ref Range Status   Specimen Description BLOOD RIGHT ARM  Final   Special Requests IN PEDIATRIC BOTTLE 5CC  Final   Culture   Final    NO GROWTH 2 DAYS Performed at [facility], [place] [address], [place], [place] [address]    Report Status PENDING  Incomplete  Blood culture (routine x 2)     Status: None (Preliminary result)   Collection Time: 12/05/16  5:45 PM  Result Value Ref Range Status   Specimen Description RIGHT ANTECUBITAL  Final   Special Requests BOTTLES DRAWN AEROBIC AND ANAEROBIC 5CC  Final   Culture   Final    NO GROWTH 2 DAYS Performed at [facility], [place] [address], [place], [place] [address]    Report Status PENDING  Incomplete  Wound or Superficial Culture     Status: None (Preliminary result)   Collection Time: 12/05/16  7:20 PM  Result Value Ref Range Status   Specimen  Description LEG RIGHT  Final   Special Requests NONE  Final   Gram Stain   Final    FEW WBC PRESENT, PREDOMINANTLY PMN NO ORGANISMS SEEN    Culture   Final    ABUNDANT PSEUDOMONAS AERUGINOSA SUSCEPTIBILITIES TO FOLLOW Performed at [facility], [place] [address], [place], [place] [address]    Report Status PENDING  Incomplete     Radiology Studies: Dg Chest Port 1 View  Result Date: 12/05/2016 CLINICAL DATA:  Shortness of breath.  Bilateral leg swelling. EXAM: PORTABLE CHEST 1 VIEW COMPARISON:  11/28/2016 FINDINGS: Cardiomegaly with tortuous atherosclerotic thoracic aorta, stable from prior exam. Left lung scarring is again seen. Streaky right infrahilar opacities favoring atelectasis. No evidence pulmonary edema. No large pleural effusion allowing for limited left lung base assessment due to habitus and portable technique. No pneumothorax. IMPRESSION: 1. Stable cardiomegaly and left lung scarring. 2. Right infrahilar atelectasis. 3. Thoracic aortic atherosclerosis. Electronically Signed   By: Jeb Levering M.D.   On: 12/05/2016 22:08    Scheduled Meds: . amitriptyline  25 mg Oral QHS  . amLODipine  2.5 mg Oral Daily  . aspirin EC  81 mg Oral Daily  . buPROPion  150 mg Oral Daily  . clindamycin (CLEOCIN) IV  600 mg Intravenous Q8H  . clopidogrel  75 mg Oral Q breakfast  . enoxaparin (LOVENOX) injection  60 mg Subcutaneous QHS  . fluticasone  1 spray Each Nare Daily  . gabapentin  300 mg Oral TID  . insulin aspart  0-15 Units Subcutaneous TID WC  . insulin aspart  0-5 Units Subcutaneous QHS  . insulin glargine  40 Units Subcutaneous BID  . metoprolol tartrate  25 mg Oral BID  . pantoprazole  40 mg Oral Daily  . potassium chloride SA  20 mEq Oral Daily  . senna-docusate  1 tablet Oral Daily  . simvastatin  20 mg Oral Daily  . terazosin  4 mg Oral Daily  . terbinafine  250 mg Oral Daily   Continuous Infusions:   LOS: 1 day   Jakalyn Kratky, Orpah Melter, MD [facility] Pager [phone]  If 7PM-7AM, please contact night-coverage [email or web address] Password [email or web address] 12/07/2016, 5:20 PM

## 2016-12-07 NOTE — Progress Notes (Signed)
Pharmacy Antibiotic Note  Jared Hall is a 83 y.o. male admitted on 12/05/2016 with medical history significant ofHTN, HLD, CVA, CHF last EF showed 55% in4/2016, DM type II, b/l lower extremity edema;who presents with worsening lower extremity swelling. Pharmacy has been consulted for ceftazidime dosing for pseudomonas cellulitis.  Plan: Ceftazidime 2gm IV q8h Follow renal function and clinical course  Height: 5\' 11"  (180.3 cm) Weight: 292 lb 8.8 oz (132.7 kg) IBW/kg (Calculated) : 75.3  Temp (24hrs), Avg:98.5 F (36.9 C), Min:98.3 F (36.8 C), Max:98.8 F (37.1 C)   Recent Labs Lab 12/05/16 1741 12/05/16 1752 12/05/16 2156 12/06/16 0539 12/07/16 0536  WBC 13.5*  --   --  12.4* 11.0*  CREATININE 1.43*  --   --  1.33* 1.24  LATICACIDVEN  --  1.24 1.36  --   --     Estimated Creatinine Clearance: 62.8 mL/min (by C-G formula based on SCr of 1.24 mg/dL).    Allergies  Allergen Reactions  . Flomax [Tamsulosin Hcl] Other (See Comments)    Excessive tearing  . Glucophage [Metformin Hcl] Diarrhea    Antimicrobials this admission: 1/18 ceftazidime   Microbiology results: 1/16 wound: pseudomonas  Thank you for allowing pharmacy to be a part of this patient's care.  Dolly Rias RPh 12/07/2016, 6:20 PM Pager [phone]

## 2016-12-08 ENCOUNTER — Encounter (HOSPITAL_COMMUNITY): Payer: Self-pay

## 2016-12-08 DIAGNOSIS — B965 Pseudomonas (aeruginosa) (mallei) (pseudomallei) as the cause of diseases classified elsewhere: Secondary | ICD-10-CM

## 2016-12-08 LAB — BASIC METABOLIC PANEL
ANION GAP: 6 (ref 5–15)
BUN: 22 mg/dL — ABNORMAL HIGH (ref 6–20)
CALCIUM: 9.4 mg/dL (ref 8.9–10.3)
CHLORIDE: 105 mmol/L (ref 101–111)
CO2: 30 mmol/L (ref 22–32)
Creatinine, Ser: 1.16 mg/dL (ref 0.61–1.24)
GFR calc Af Amer: >60 mL/min (ref >60)
GFR calc non Af Amer: 56 mL/min — ABNORMAL LOW (ref >60)
GLUCOSE: 179 mg/dL — AB (ref 65–99)
Potassium: 4.8 mmol/L (ref 3.5–5.1)
Sodium: 141 mmol/L (ref 135–145)

## 2016-12-08 LAB — GLUCOSE, CAPILLARY
GLUCOSE-CAPILLARY: 157 mg/dL — AB (ref 65–99)
GLUCOSE-CAPILLARY: 197 mg/dL — AB (ref 65–99)
GLUCOSE-CAPILLARY: 158 mg/dL — AB (ref 65–99)
Glucose-Capillary: 138 mg/dL — ABNORMAL HIGH (ref 65–99)
Glucose-Capillary: 162 mg/dL — ABNORMAL HIGH (ref 65–99)

## 2016-12-08 MED ORDER — IPRATROPIUM-ALBUTEROL 0.5-2.5 (3) MG/3ML IN SOLN
3.0000 mL | Freq: Two times a day (BID) | RESPIRATORY_TRACT | Status: DC
  Administered 2016-12-09 – 2016-12-12 (×8): 3 mL via RESPIRATORY_TRACT
  Filled 2016-12-08 (×8): qty 3

## 2016-12-08 NOTE — Progress Notes (Addendum)
PROGRESS NOTE    Jared Hall  [id number] DOB: Mar 29, 1931 DOA: 12/05/2016 PCP: Henrine Screws, MD    Brief Narrative:   83 y.o. male with medical history significant of HTN, HLD, CVA, CHF last EF showed 55% in 02/2015, DM type II, b/l lower extremity edema; who presents with worsening lower extremity swelling. Patient was sent to the ED 7 days ago for reports of his right fifth digit turning black. He was recommended to increase Lasix to 40 mg twice daily. 4 days ago he saw his PCP who started him on Augmentin which she's been taking twice daily as prescribed. He reports that he noticed that his leg started to weep yesterday. His home health nurses evaluated him reported worsening erythema and drainage from the legs. Associated symptoms include worsening dyspnea with exertion, orthopnea, and weight gain. He denies having any fevers, chills, or chest pain.  Upon admission patient was evaluated and seen to be afebrile, heart rates up to 107, and all other vitals relatively within normal limits. Patient was started on clindamycin in the ED . Blood and wound cultures obtained.  Assessment & Plan:   Principal Problem:   Cellulitis Active Problems:   History of CVA (cerebrovascular accident)   Essential hypertension, benign   Type 1 diabetes mellitus with peripheral circulatory complications (HCC)   Paroxysmal atrial fibrillation (HCC)   Chronic kidney disease, stage III (moderate)   Cellulitis of B LE with sepsis present on admission:  -Patient presents tachycardic, tachypneic, WBC of 13.5, and lower extremity redness and swelling. Cultures obtained in ED. Patient started on clindamycin and area of erythema was marked. - cultures obtained, demonstrating abundant pseudomonas species -Patient had been continued on clindamycin empirically - Patient is now continued on ceftaz for better pseudomonas coverage - Wound care consulted, appreciate input - Cultures reviewed. Pseudomonas noted to  be pan-sensitive, including ciprofloxacin -Consider transition to PO abx in the next 24hrs if stable - Check cbc in AM  Bilateral lower extremity swelling, likely secondary to chronic venous stasis, not CHF - Elevate bilateral lower extremity - 2d echo done, normal LVEF without significant stensis - Patient was given 40 mg of Lasix IV 1 dose - Continue to hold off on further diuretics for now - Family reports pt has been noncompliant with keeping LE elevated prior to admission - Stable at this time  Dyspnea - Continuous pulse oximetry with nasal cannula oxygen as needed. - decreased BS on exam, trace wheezing on auscultation - Continue scheduled duonebs q6hrs, q2prn - For now, will continue prednisone 60mg  daily  Chronic kidney disease stage III:  -Cr has further improved to 1.16 -Will recheck bmet in AM  Diabetes mellitus type 1 - Decreased home Lantus from 60 units three times daily to 40 units twice a day while hospitalized in a more controlled setting.  - As steroids are being started, increased lantus to 45 units bid - Plan to continue SSI coverage  CAD/history of CVA - For now, will continue Plavix and aspirin - remains stable  History of PSVT/ PAF:  -Eliquis was reported to be discontinued and patient was started on Plavix. -currently stable  Essential hypertension  - Continue metoprolol as tolerated - BP improved with addition of low dose Ca channel blocker - Stable overnight   Hyperlipidemia - Continue simvastatin as toelrated - currently stable  DVT prophylaxis: Lovenox subQ Code Status: Full Family Communication: Pt in room, family not at bedside Disposition Plan: Uncertain at this time  Consultants:  Procedures:     Antimicrobials: Anti-infectives    Start     Dose/Rate Route Frequency Ordered Stop   12/07/16 1800  cefTAZidime (FORTAZ) 2 g in dextrose 5 % 50 mL IVPB     2 g 100 mL/hr over 30 Minutes Intravenous Every 8 hours 12/07/16  1740     12/06/16 1000  terbinafine (LAMISIL) tablet 250 mg    Comments:  continous     250 mg Oral Daily 12/05/16 2229     12/06/16 0300  clindamycin (CLEOCIN) IVPB 600 mg  Status:  Discontinued     600 mg 100 mL/hr over 30 Minutes Intravenous Every 8 hours 12/05/16 2229 12/07/16 1731   12/05/16 1900  clindamycin (CLEOCIN) IVPB 600 mg     600 mg 100 mL/hr over 30 Minutes Intravenous  Once 12/05/16 1859 12/05/16 2010      Subjective: Pt claims to feel better today. Reports improvement with neb tx  Objective: Vitals:   12/08/16 0959 12/08/16 1024 12/08/16 1300 12/08/16 1447  BP: (!) 158/64  (!) 141/66   Pulse: 82  81   Resp:   18   Temp:   98.7 F (37.1 C)   TempSrc:   Oral   SpO2:  95% 96% 96%  Weight:      Height:        Intake/Output Summary (Last 24 hours) at 12/08/16 1531 Last data filed at 12/08/16 0600  Gross per 24 hour  Intake              100 ml  Output              400 ml  Net             -300 ml   Filed Weights   12/05/16 1707 12/06/16 0000  Weight: 125.2 kg (276 lb) 132.7 kg (292 lb 8.8 oz)    Examination:  General exam: sitting in chair, in nad Respiratory system: normal chest rise, decreased breath sounds throughout Cardiovascular system: regular rhythm, s1-s2 Gastrointestinal system: obese, nondisteded, pos BS Central nervous system: no seizures, no tremors Extremities: no cyanosis, no joint deformities Skin: no pallor, no rashes Psychiatry: affect normal// no auditory hallucinations   Data Reviewed: I have personally reviewed following labs and imaging studies  CBC:  Recent Labs Lab 12/05/16 1741 12/06/16 0539 12/07/16 0536  WBC 13.5* 12.4* 11.0*  NEUTROABS 10.5*  --   --   HGB 11.4* 11.0* 10.9*  HCT 36.1* 35.2* 35.3*  MCV 68.8* 68.5* 68.8*  PLT 240 235 [id number]   Basic Metabolic Panel:  Recent Labs Lab 12/05/16 1741 12/06/16 0539 12/07/16 0536 12/08/16 0517  NA 139 139 141 141  K 3.7 3.2* 4.2 4.8  CL 101 103 105 105  CO2 31  30 29 30   GLUCOSE 177* 65 136* 179*  BUN 26* 22* 22* 22*  CREATININE 1.43* 1.33* 1.24 1.16  CALCIUM 9.2 8.8* 8.9 9.4   GFR: Estimated Creatinine Clearance: 67.1 mL/min (by C-G formula based on SCr of 1.16 mg/dL). Liver Function Tests: No results for input(s): AST, ALT, ALKPHOS, BILITOT, PROT, ALBUMIN in the last 168 hours. No results for input(s): LIPASE, AMYLASE in the last 168 hours. No results for input(s): AMMONIA in the last 168 hours. Coagulation Profile: No results for input(s): INR, PROTIME in the last 168 hours. Cardiac Enzymes: No results for input(s): CKTOTAL, CKMB, CKMBINDEX, TROPONINI in the last 168 hours. BNP (last 3 results) No results for input(s): PROBNP in the last 8760  hours. HbA1C: No results for input(s): HGBA1C in the last 72 hours. CBG:  Recent Labs Lab 12/07/16 1208 12/07/16 1644 12/07/16 2055 12/08/16 0731 12/08/16 1154  GLUCAP 155* 152* 138* 162* 157*   Lipid Profile: No results for input(s): CHOL, HDL, LDLCALC, TRIG, CHOLHDL, LDLDIRECT in the last 72 hours. Thyroid Function Tests: No results for input(s): TSH, T4TOTAL, FREET4, T3FREE, THYROIDAB in the last 72 hours. Anemia Panel: No results for input(s): VITAMINB12, FOLATE, FERRITIN, TIBC, IRON, RETICCTPCT in the last 72 hours. Sepsis Labs:  Recent Labs Lab 12/05/16 1752 12/05/16 2156  LATICACIDVEN 1.24 1.36    Recent Results (from the past 240 hour(s))  Blood culture (routine x 2)     Status: None (Preliminary result)   Collection Time: 12/05/16  5:45 PM  Result Value Ref Range Status   Specimen Description BLOOD RIGHT ARM  Final   Special Requests IN PEDIATRIC BOTTLE 5CC  Final   Culture   Final    NO GROWTH 2 DAYS Performed at [facility], [place] [address] [place], [place] [address]    Report Status PENDING  Incomplete  Blood culture (routine x 2)     Status: None (Preliminary result)   Collection Time: 12/05/16  5:45 PM  Result Value Ref Range Status   Specimen  Description RIGHT ANTECUBITAL  Final   Special Requests BOTTLES DRAWN AEROBIC AND ANAEROBIC 5CC  Final   Culture   Final    NO GROWTH 2 DAYS Performed at [facility], [place] [address] [place], [place] [address]    Report Status PENDING  Incomplete  Wound or Superficial Culture     Status: None (Preliminary result)   Collection Time: 12/05/16  7:20 PM  Result Value Ref Range Status   Specimen Description LEG RIGHT  Final   Special Requests NONE  Final   Gram Stain   Final    FEW WBC PRESENT, PREDOMINANTLY PMN NO ORGANISMS SEEN    Culture   Final    ABUNDANT PSEUDOMONAS AERUGINOSA CULTURE REINCUBATED FOR BETTER GROWTH Performed at [facility], [place] [address] [place], [place] [address]    Report Status PENDING  Incomplete   Organism ID, Bacteria PSEUDOMONAS AERUGINOSA  Final      Susceptibility   Pseudomonas aeruginosa - MIC*    CEFTAZIDIME 4 SENSITIVE Sensitive     CIPROFLOXACIN <=0.25 SENSITIVE Sensitive     GENTAMICIN <=1 SENSITIVE Sensitive     IMIPENEM 1 SENSITIVE Sensitive     PIP/TAZO 8 SENSITIVE Sensitive     CEFEPIME 4 SENSITIVE Sensitive     * ABUNDANT PSEUDOMONAS AERUGINOSA     Radiology Studies: No results found.  Scheduled Meds: . amitriptyline  25 mg Oral QHS  . amLODipine  2.5 mg Oral Daily  . aspirin EC  81 mg Oral Daily  . buPROPion  150 mg Oral Daily  . cefTAZidime (FORTAZ)  IV  2 g Intravenous Q8H  . clopidogrel  75 mg Oral Q breakfast  . enoxaparin (LOVENOX) injection  60 mg Subcutaneous QHS  . fluticasone  1 spray Each Nare Daily  . gabapentin  300 mg Oral TID  . insulin aspart  0-15 Units Subcutaneous TID WC  . insulin aspart  0-5 Units Subcutaneous QHS  . insulin glargine  45 Units Subcutaneous BID  . ipratropium-albuterol  3 mL Nebulization TID  . metoprolol tartrate  25 mg Oral BID  . pantoprazole  40 mg Oral Daily  . potassium chloride SA  20 mEq Oral Daily  . predniSONE  60 mg Oral Q breakfast  . senna-docusate  1 tablet  Oral Daily  . simvastatin  20 mg Oral Daily  . terazosin  4 mg Oral Daily  . terbinafine  250 mg Oral Daily   Continuous Infusions:   LOS: 2 days   Ace Bergfeld, Orpah Melter, MD [facility] Pager [phone]  If 7PM-7AM, please contact night-coverage [email or web address] Password [email or web address] 12/08/2016, 3:31 PM

## 2016-12-08 NOTE — Progress Notes (Signed)
Physical Therapy Treatment Patient Details Name: Jared Hall MRN: [id number] DOB: Mar 04, 1931 Today's Date: December 27, 2016    History of Present Illness Pt is an 83 year old male admitted for cellulitis.  PMH significant for CVA, CHF, DM type 2, bil LE edema    PT Comments    Pt assisted OOB to recliner today.  Pt felt unable to ambulate due to LE pain.    Follow Up Recommendations  Supervision/Assistance - 24 hour;SNF     Equipment Recommendations  None recommended by PT    Recommendations for Other Services       Precautions / Restrictions Precautions Precautions: Fall Precaution Comments: HOH    Mobility  Bed Mobility Overal bed mobility: Needs Assistance Bed Mobility: Supine to Sit     Supine to sit: Min assist;HOB elevated     General bed mobility comments: increased time and effort, assist for trunk  Transfers Overall transfer level: Needs assistance Equipment used: Rolling walker (2 wheeled) Transfers: Sit to/from [facility] Sit to Stand: Min assist;Mod assist;+2 safety/equipment Stand pivot transfers: Min assist;+2 safety/equipment       General transfer comment: verbal cues for safe technique, assist to rise and steady  Ambulation/Gait             General Gait Details: pt declined ambulating stating his LEs were too painful   Stairs            Wheelchair Mobility    Modified Rankin (Stroke Patients Only)       Balance                                    Cognition Arousal/Alertness: Awake/alert Behavior During Therapy: WFL for tasks assessed/performed Overall Cognitive Status: Within Functional Limits for tasks assessed                      Exercises      General Comments        Pertinent Vitals/Pain Pain Assessment: Faces Faces Pain Scale: Hurts little more Pain Location: bil LEs Pain Descriptors / Indicators: Sore Pain Intervention(s): Limited activity within patient's  tolerance;Monitored during session;Repositioned    Home Living                      Prior Function            PT Goals (current goals can now be found in the care plan section) Progress towards PT goals: Progressing toward goals    Frequency    Min 3X/week      PT Plan Current plan remains appropriate    Co-evaluation             End of Session Equipment Utilized During Treatment: Gait belt;Oxygen Activity Tolerance: Patient tolerated treatment well Patient left: with call bell/phone within reach;in chair;with chair alarm set     Time: 1041-1050 PT Time Calculation (min) (ACUTE ONLY): 9 min  Charges:  $Therapeutic Activity: 8-22 mins                    G Codes:      Mickie Kozikowski,KATHrine E 12-27-16, 12:04 PM Carmelia Bake, PT, DPT 12-27-2016 Pager: [phone]

## 2016-12-08 NOTE — Progress Notes (Signed)
Occupational Therapy Treatment Patient Details Name: TOUSSAINT SKRINE MRN: [id number] DOB: [birth year] Today's Date: 12/08/2016    History of present illness Pt is an 83 year old male admitted for cellulitis.  PMH significant for CVA, CHF, DM type 2, bil LE edema   OT comments  Limited tx due to fatique  Follow Up Recommendations  SNF    Equipment Recommendations  3 in 1 bedside commode    Recommendations for Other Services      Precautions / Restrictions Precautions Precautions: Fall Precaution Comments: HOH Restrictions Weight Bearing Restrictions: No       Mobility Bed Mobility Overal bed mobility: Needs Assistance Bed Mobility: Supine to Sit      Sit to supine: Supervision   General bed mobility comments: pt managed bil LEs, used momentum to swing up on bed  Transfers Overall transfer level: Needs assistance Equipment used: Rolling walker (2 wheeled) Transfers: Sit to/from [facility] Sit to Stand: Min assist Stand pivot transfers: Min assist;       General transfer comment: verbal cues for safe technique, assist to rise and steady    Balance                                   ADL                           Toilet Transfer: Minimal assistance;Stand-pivot;RW (chair to bed)             General ADL Comments: pt requesting back to bed.  Shaky when standing.  Had completed ADL and did not need to use restroom.  Reapplied prevalon boots      Vision                     Perception     Praxis      Cognition   Behavior During Therapy: WFL for tasks assessed/performed Overall Cognitive Status: Within Functional Limits for tasks assessed                       Extremity/Trunk Assessment               Exercises     Shoulder Instructions       General Comments      Pertinent Vitals/ Pain       Pain Assessment: Faces Faces Pain Scale: Hurts a little bit Pain Location: bil LEs Pain  Descriptors / Indicators: Sore Pain Intervention(s): Limited activity within patient's tolerance;Monitored during session;Repositioned  Home Living                                          Prior Functioning/Environment              Frequency  Min 2X/week        Progress Toward Goals  OT Goals(current goals can now be found in the care plan section)  Progress towards OT goals: Progressing toward goals     Plan      Co-evaluation                 End of Session     Activity Tolerance     Patient Left     Nurse Communication  Time: [id number] OT Time Calculation (min): 10 min  Charges: OT General Charges $OT Visit: 1 Procedure OT Treatments $Therapeutic Activity: 8-22 mins  Camera Krienke 12/08/2016, 3:13 PM  Lesle Chris, OTR/L [phone] 12/08/2016

## 2016-12-09 LAB — BLOOD GAS, ARTERIAL
ACID-BASE EXCESS: 5.6 mmol/L — AB (ref 0.0–2.0)
Bicarbonate: 30.2 mmol/L — ABNORMAL HIGH (ref 20.0–28.0)
DRAWN BY: 441261
O2 Content: 3 L/min
O2 Saturation: 94.3 %
PCO2 ART: 46 mmHg (ref 32.0–48.0)
PH ART: 7.433 (ref 7.350–7.450)
PO2 ART: 75.2 mmHg — AB (ref 83.0–108.0)
Patient temperature: 98.6

## 2016-12-09 LAB — COMPREHENSIVE METABOLIC PANEL
ALT: 21 U/L (ref 17–63)
ANION GAP: 7 (ref 5–15)
AST: 20 U/L (ref 15–41)
Albumin: 3 g/dL — ABNORMAL LOW (ref 3.5–5.0)
Alkaline Phosphatase: 68 U/L (ref 38–126)
BILIRUBIN TOTAL: 0.4 mg/dL (ref 0.3–1.2)
BUN: 27 mg/dL — AB (ref 6–20)
CALCIUM: 9.5 mg/dL (ref 8.9–10.3)
CO2: 32 mmol/L (ref 22–32)
Chloride: 104 mmol/L (ref 101–111)
Creatinine, Ser: 1.19 mg/dL (ref 0.61–1.24)
GFR calc Af Amer: >60 mL/min (ref >60)
GFR, EST NON AFRICAN AMERICAN: 55 mL/min — AB (ref >60)
Glucose, Bld: 117 mg/dL — ABNORMAL HIGH (ref 65–99)
POTASSIUM: 4.4 mmol/L (ref 3.5–5.1)
Sodium: 143 mmol/L (ref 135–145)
TOTAL PROTEIN: 6.7 g/dL (ref 6.5–8.1)

## 2016-12-09 LAB — CBC
HEMATOCRIT: 37.2 % — AB (ref 39.0–52.0)
Hemoglobin: 11.3 g/dL — ABNORMAL LOW (ref 13.0–17.0)
MCH: 21.3 pg — ABNORMAL LOW (ref 26.0–34.0)
MCHC: 30.4 g/dL (ref 30.0–36.0)
MCV: 70.2 fL — ABNORMAL LOW (ref 78.0–100.0)
Platelets: 291 10*3/uL (ref 150–400)
RBC: 5.3 MIL/uL (ref 4.22–5.81)
RDW: 15.6 % — AB (ref 11.5–15.5)
WBC: 13.5 10*3/uL — AB (ref 4.0–10.5)

## 2016-12-09 LAB — GLUCOSE, CAPILLARY
GLUCOSE-CAPILLARY: 96 mg/dL (ref 65–99)
GLUCOSE-CAPILLARY: 175 mg/dL — AB (ref 65–99)
Glucose-Capillary: 115 mg/dL — ABNORMAL HIGH (ref 65–99)

## 2016-12-09 LAB — BRAIN NATRIURETIC PEPTIDE: B NATRIURETIC PEPTIDE 5: 119.6 pg/mL — AB (ref 0.0–100.0)

## 2016-12-09 NOTE — Progress Notes (Signed)
RT note:  Patient refuses CPAP.  States he does not wear one at home and will not wear one here.

## 2016-12-09 NOTE — Progress Notes (Signed)
PROGRESS NOTE    Jared Hall  [id number] DOB: 09-Oct-1931 DOA: 12/05/2016 PCP: Henrine Screws, MD    Brief Narrative:   83 y.o. male with medical history significant of HTN, HLD, CVA, CHF last EF showed 55% in 02/2015, DM type II, b/l lower extremity edema; who presents with worsening lower extremity swelling. Patient was sent to the ED 7 days ago for reports of his right fifth digit turning black. He was recommended to increase Lasix to 40 mg twice daily. 4 days ago he saw his PCP who started him on Augmentin which she's been taking twice daily as prescribed. He reports that he noticed that his leg started to weep yesterday. His home health nurses evaluated him reported worsening erythema and drainage from the legs. Associated symptoms include worsening dyspnea with exertion, orthopnea, and weight gain. He denies having any fevers, chills, or chest pain.  Upon admission patient was evaluated and seen to be afebrile, heart rates up to 107, and all other vitals relatively within normal limits. Patient was started on clindamycin in the ED . Blood and wound cultures obtained.  Assessment & Plan:   Principal Problem:   Cellulitis Active Problems:   History of CVA (cerebrovascular accident)   Essential hypertension, benign   Type 1 diabetes mellitus with peripheral circulatory complications (HCC)   Paroxysmal atrial fibrillation (HCC)   Chronic kidney disease, stage III (moderate)   Cellulitis of B LE with sepsis present on admission:  -Patient presents tachycardic, tachypneic, WBC of 13.5, and lower extremity redness and swelling. Cultures obtained in ED. Patient started on clindamycin and area of erythema was marked. - cultures obtained, demonstrating abundant pseudomonas species -Patient had been continued on clindamycin empirically - Patient is now continued on ceftaz for better pseudomonas coverage - Wound care consulted, appreciate input - Cultures reviewed. Pseudomonas noted to  be pan-sensitive, including ciprofloxacin -Consider transition to PO abx in the next 24hrs if stable - WBC up to 13.5 today, likely secondary to prednisone. Afebrile - Repeat CBC in AM  Bilateral lower extremity swelling, likely secondary to chronic venous stasis, not CHF - Elevate bilateral lower extremity - 2d echo done, normal LVEF without significant stensis - Patient was given 40 mg of Lasix IV 1 dose - Continue to hold off on further diuretics for now - Family reports pt has been noncompliant with keeping LE elevated prior to admission - Stable at this time  Dyspnea - Chart reviewed. Patient has a hx of chronic sob over the past several months. Documented hx of chronic diastolic CHF - Continuous pulse oximetry with nasal cannula oxygen as needed. - decreased BS on exam, trace wheezing on auscultation - Continue scheduled duonebs q6hrs, q2prn - For now, will continue prednisone 60mg  daily - Will check BNP to rule out acute heart failure. If neg, consider chest CT  Chronic kidney disease stage III:  -Cr has improved to 1.19 -Repeat bmet in AM  Diabetes mellitus type 1 - Decreased home Lantus from 60 units three times daily to 40 units twice a day while hospitalized in a more controlled setting.  - As steroids are being started, increased lantus to 45 units bid - Will continue SSI coverage - glucose trends reviewed. Stable  CAD/history of CVA - For now, will continue Plavix and aspirin - currently stable  History of PSVT/ PAF:  -Eliquis was reported to be discontinued and patient was started on Plavix. -Remains stable  Essential hypertension  - Continue metoprolol as tolerated - BP improved  with addition of low dose Ca channel blocker - Remains stable   Hyperlipidemia - Continue simvastatin as toelrated - Remains stable  DVT prophylaxis: Lovenox subQ Code Status: Full Family Communication: Pt in room, family not at bedside Disposition Plan: Anticipate  SNF, timing uncertain  Consultants:    Procedures:     Antimicrobials: Anti-infectives    Start     Dose/Rate Route Frequency Ordered Stop   12/07/16 1800  cefTAZidime (FORTAZ) 2 g in dextrose 5 % 50 mL IVPB     2 g 100 mL/hr over 30 Minutes Intravenous Every 8 hours 12/07/16 1740     12/06/16 1000  terbinafine (LAMISIL) tablet 250 mg    Comments:  continous     250 mg Oral Daily 12/05/16 2229     12/06/16 0300  clindamycin (CLEOCIN) IVPB 600 mg  Status:  Discontinued     600 mg 100 mL/hr over 30 Minutes Intravenous Every 8 hours 12/05/16 2229 12/07/16 1731   12/05/16 1900  clindamycin (CLEOCIN) IVPB 600 mg     600 mg 100 mL/hr over 30 Minutes Intravenous  Once 12/05/16 1859 12/05/16 2010      Subjective: No complaints at this time  Objective: Vitals:   12/08/16 1447 12/08/16 2009 12/08/16 2111 12/09/16 0455  BP:   (!) 145/52 (!) 177/67  Pulse:   91 93  Resp:   18 (!) 30  Temp:   97.6 F (36.4 C) 98.5 F (36.9 C)  TempSrc:   Oral Oral  SpO2: 96% 94% 98% 95%  Weight:      Height:        Intake/Output Summary (Last 24 hours) at 12/09/16 1242 Last data filed at 12/09/16 1100  Gross per 24 hour  Intake              580 ml  Output             1025 ml  Net             -445 ml   Filed Weights   12/05/16 1707 12/06/16 0000  Weight: 125.2 kg (276 lb) 132.7 kg (292 lb 8.8 oz)    Examination:  General exam: awake, conversant, in nad Respiratory system: mildly increased resp effort, no audible wheezing, decreased BS throughout Cardiovascular system: regular rate, s1, s2 Gastrointestinal system:soft, nondistended, no masses Central nervous system: cn2-12 grossly intact, strength intact Extremities: perfused, no cyanosis Skin: normal skin turgor, no notable skin lesions seen Psychiatry: mood normal// no visual hallucinations   Data Reviewed: I have personally reviewed following labs and imaging studies  CBC:  Recent Labs Lab 12/05/16 1741 12/06/16 0539  12/07/16 0536 12/09/16 0528  WBC 13.5* 12.4* 11.0* 13.5*  NEUTROABS 10.5*  --   --   --   HGB 11.4* 11.0* 10.9* 11.3*  HCT 36.1* 35.2* 35.3* 37.2*  MCV 68.8* 68.5* 68.8* 70.2*  PLT 240 235 251 [id number]   Basic Metabolic Panel:  Recent Labs Lab 12/05/16 1741 12/06/16 0539 12/07/16 0536 12/08/16 0517 12/09/16 0528  NA 139 139 141 141 143  K 3.7 3.2* 4.2 4.8 4.4  CL 101 103 105 105 104  CO2 31 30 29 30  32  GLUCOSE 177* 65 136* 179* 117*  BUN 26* 22* 22* 22* 27*  CREATININE 1.43* 1.33* 1.24 1.16 1.19  CALCIUM 9.2 8.8* 8.9 9.4 9.5   GFR: Estimated Creatinine Clearance: 65.4 mL/min (by C-G formula based on SCr of 1.19 mg/dL). Liver Function Tests:  Recent Labs Lab 12/09/16  0528  AST 20  ALT 21  ALKPHOS 68  BILITOT 0.4  PROT 6.7  ALBUMIN 3.0*   No results for input(s): LIPASE, AMYLASE in the last 168 hours. No results for input(s): AMMONIA in the last 168 hours. Coagulation Profile: No results for input(s): INR, PROTIME in the last 168 hours. Cardiac Enzymes: No results for input(s): CKTOTAL, CKMB, CKMBINDEX, TROPONINI in the last 168 hours. BNP (last 3 results) No results for input(s): PROBNP in the last 8760 hours. HbA1C: No results for input(s): HGBA1C in the last 72 hours. CBG:  Recent Labs Lab 12/08/16 1154 12/08/16 1732 12/08/16 2108 12/09/16 0748 12/09/16 1145  GLUCAP 157* 197* 158* 96 115*   Lipid Profile: No results for input(s): CHOL, HDL, LDLCALC, TRIG, CHOLHDL, LDLDIRECT in the last 72 hours. Thyroid Function Tests: No results for input(s): TSH, T4TOTAL, FREET4, T3FREE, THYROIDAB in the last 72 hours. Anemia Panel: No results for input(s): VITAMINB12, FOLATE, FERRITIN, TIBC, IRON, RETICCTPCT in the last 72 hours. Sepsis Labs:  Recent Labs Lab 12/05/16 1752 12/05/16 2156  LATICACIDVEN 1.24 1.36    Recent Results (from the past 240 hour(s))  Blood culture (routine x 2)     Status: None (Preliminary result)   Collection Time: 12/05/16  5:45  PM  Result Value Ref Range Status   Specimen Description BLOOD RIGHT ARM  Final   Special Requests IN PEDIATRIC BOTTLE 5CC  Final   Culture   Final    NO GROWTH 4 DAYS Performed at [facility], [place] [address] [place], [place] [address]    Report Status PENDING  Incomplete  Blood culture (routine x 2)     Status: None (Preliminary result)   Collection Time: 12/05/16  5:45 PM  Result Value Ref Range Status   Specimen Description RIGHT ANTECUBITAL  Final   Special Requests BOTTLES DRAWN AEROBIC AND ANAEROBIC 5CC  Final   Culture   Final    NO GROWTH 4 DAYS Performed at [facility], [place] [address], [place], [place] [address]    Report Status PENDING  Incomplete  Wound or Superficial Culture     Status: None (Preliminary result)   Collection Time: 12/05/16  7:20 PM  Result Value Ref Range Status   Specimen Description LEG RIGHT  Final   Special Requests NONE  Final   Gram Stain   Final    FEW WBC PRESENT, PREDOMINANTLY PMN NO ORGANISMS SEEN    Culture   Final    ABUNDANT PSEUDOMONAS AERUGINOSA CULTURE REINCUBATED FOR BETTER GROWTH Performed at [facility], [place] [address] [place],  [address]    Report Status PENDING  Incomplete   Organism ID, Bacteria PSEUDOMONAS AERUGINOSA  Final      Susceptibility   Pseudomonas aeruginosa - MIC*    CEFTAZIDIME 4 SENSITIVE Sensitive     CIPROFLOXACIN <=0.25 SENSITIVE Sensitive     GENTAMICIN <=1 SENSITIVE Sensitive     IMIPENEM 1 SENSITIVE Sensitive     PIP/TAZO 8 SENSITIVE Sensitive     CEFEPIME 4 SENSITIVE Sensitive     * ABUNDANT PSEUDOMONAS AERUGINOSA     Radiology Studies: No results found.  Scheduled Meds: . amitriptyline  25 mg Oral QHS  . amLODipine  2.5 mg Oral Daily  . aspirin EC  81 mg Oral Daily  . buPROPion  150 mg Oral Daily  . cefTAZidime (FORTAZ)  IV  2 g Intravenous Q8H  . clopidogrel  75 mg Oral Q breakfast  . enoxaparin (  LOVENOX) injection  60 mg Subcutaneous QHS  . fluticasone  1  spray Each Nare Daily  . gabapentin  300 mg Oral TID  . insulin aspart  0-15 Units Subcutaneous TID WC  . insulin aspart  0-5 Units Subcutaneous QHS  . insulin glargine  45 Units Subcutaneous BID  . ipratropium-albuterol  3 mL Nebulization BID  . metoprolol tartrate  25 mg Oral BID  . pantoprazole  40 mg Oral Daily  . potassium chloride SA  20 mEq Oral Daily  . predniSONE  60 mg Oral Q breakfast  . senna-docusate  1 tablet Oral Daily  . simvastatin  20 mg Oral Daily  . terazosin  4 mg Oral Daily  . terbinafine  250 mg Oral Daily   Continuous Infusions:   LOS: 3 days   Sueellen Kayes, Orpah Melter, MD [facility] Pager [phone]  If 7PM-7AM, please contact night-coverage [email or web address] Password [email or web address] 12/09/2016, 12:42 PM

## 2016-12-10 ENCOUNTER — Inpatient Hospital Stay (HOSPITAL_COMMUNITY): Payer: Medicare Other

## 2016-12-10 LAB — BASIC METABOLIC PANEL
Anion gap: 6 (ref 5–15)
BUN: 28 mg/dL — ABNORMAL HIGH (ref 6–20)
CHLORIDE: 102 mmol/L (ref 101–111)
CO2: 31 mmol/L (ref 22–32)
CREATININE: 1.16 mg/dL (ref 0.61–1.24)
Calcium: 9.2 mg/dL (ref 8.9–10.3)
GFR calc non Af Amer: 56 mL/min — ABNORMAL LOW (ref >60)
Glucose, Bld: 117 mg/dL — ABNORMAL HIGH (ref 65–99)
POTASSIUM: 4.4 mmol/L (ref 3.5–5.1)
SODIUM: 139 mmol/L (ref 135–145)

## 2016-12-10 LAB — CBC
HEMATOCRIT: 37.3 % — AB (ref 39.0–52.0)
Hemoglobin: 11.6 g/dL — ABNORMAL LOW (ref 13.0–17.0)
MCH: 21.6 pg — AB (ref 26.0–34.0)
MCHC: 31.1 g/dL (ref 30.0–36.0)
MCV: 69.6 fL — ABNORMAL LOW (ref 78.0–100.0)
PLATELETS: 287 10*3/uL (ref 150–400)
RBC: 5.36 MIL/uL (ref 4.22–5.81)
RDW: 15.4 % (ref 11.5–15.5)
WBC: 9.8 10*3/uL (ref 4.0–10.5)

## 2016-12-10 LAB — GLUCOSE, CAPILLARY
GLUCOSE-CAPILLARY: 168 mg/dL — AB (ref 65–99)
Glucose-Capillary: 155 mg/dL — ABNORMAL HIGH (ref 65–99)
Glucose-Capillary: 211 mg/dL — ABNORMAL HIGH (ref 65–99)

## 2016-12-10 LAB — AEROBIC CULTURE W GRAM STAIN (SUPERFICIAL SPECIMEN)

## 2016-12-10 LAB — AEROBIC CULTURE  (SUPERFICIAL SPECIMEN)

## 2016-12-10 LAB — CULTURE, BLOOD (ROUTINE X 2)
CULTURE: NO GROWTH
CULTURE: NO GROWTH

## 2016-12-10 MED ORDER — CIPROFLOXACIN HCL 500 MG PO TABS
500.0000 mg | ORAL_TABLET | Freq: Two times a day (BID) | ORAL | Status: DC
  Administered 2016-12-10 – 2016-12-11 (×2): 500 mg via ORAL
  Filled 2016-12-10 (×2): qty 1

## 2016-12-10 MED ORDER — DEXTROSE 5 % IV SOLN
2.0000 g | Freq: Two times a day (BID) | INTRAVENOUS | Status: DC
  Administered 2016-12-10: 2 g via INTRAVENOUS
  Filled 2016-12-10: qty 2

## 2016-12-10 MED ORDER — FUROSEMIDE 10 MG/ML IJ SOLN
40.0000 mg | Freq: Every day | INTRAMUSCULAR | Status: DC
  Administered 2016-12-10 – 2016-12-14 (×5): 40 mg via INTRAVENOUS
  Filled 2016-12-10 (×5): qty 4

## 2016-12-10 MED ORDER — IOPAMIDOL (ISOVUE-300) INJECTION 61%
INTRAVENOUS | Status: AC
  Administered 2016-12-10: 75 mL via INTRAVENOUS
  Filled 2016-12-10: qty 75

## 2016-12-10 NOTE — Progress Notes (Signed)
PROGRESS NOTE    Jared Hall  [id number] DOB: [birth year] DOA: 12/05/2016 PCP: Henrine Screws, MD    Brief Narrative:   83 y.o. male with medical history significant of HTN, HLD, CVA, CHF last EF showed 55% in 02/2015, DM type II, b/l lower extremity edema; who presents with worsening lower extremity swelling. Patient was sent to the ED 7 days ago for reports of his right fifth digit turning black. He was recommended to increase Lasix to 40 mg twice daily. 4 days ago he saw his PCP who started him on Augmentin which she's been taking twice daily as prescribed. He reports that he noticed that his leg started to weep yesterday. His home health nurses evaluated him reported worsening erythema and drainage from the legs. Associated symptoms include worsening dyspnea with exertion, orthopnea, and weight gain. He denies having any fevers, chills, or chest pain.  Upon admission patient was evaluated and seen to be afebrile, heart rates up to 107, and all other vitals relatively within normal limits. Patient was started on clindamycin in the ED . Blood and wound cultures obtained.  Assessment & Plan:   Principal Problem:   Cellulitis Active Problems:   History of CVA (cerebrovascular accident)   Essential hypertension, benign   Type 1 diabetes mellitus with peripheral circulatory complications (HCC)   Paroxysmal atrial fibrillation (HCC)   Chronic kidney disease, stage III (moderate)   Cellulitis of B LE with sepsis present on admission:  -Patient presents tachycardic, tachypneic, WBC of 13.5, and lower extremity redness and swelling. Cultures obtained in ED. Patient started on clindamycin and area of erythema was marked. - cultures obtained, demonstrating abundant pseudomonas species -Patient had been continued on clindamycin empirically - Patient is now continued on ceftaz for better pseudomonas coverage - Wound care consulted, appreciate input - Cultures reviewed. Pseudomonas noted to  be pan-sensitive, including ciprofloxacin - Cellulitis appears much improved since admission. Will transition to vantin PO - Will repeat CBC in AM  Bilateral lower extremity swelling, likely secondary to chronic venous stasis, not CHF - Continue to levate bilateral lower extremity - 2d echo done, normal LVEF without significant stensis - Patient was given 40 mg of Lasix IV 1 dose - Continue to hold off on further diuretics for now - Family had reported that pt was noncompliant with keeping LE elevated prior to admission - Improved  Dyspnea - Chart reviewed. Patient has a hx of chronic sob over the past several months. Documented hx of chronic diastolic CHF - Continuous pulse oximetry with nasal cannula oxygen as needed. - coarse BS on exam with limited air movement - Continue scheduled duonebs q6hrs, q2prn - For now, will continue prednisone 60mg  daily - Labs reviewed. BNP up to 119. Will start patient on IV lasix as he is more O2 dependent - Have ordered CT chest given chronicity of dyspnea. No prior studies were done - Check renal panel in AM  Chronic kidney disease stage III:  -Cr stable at 1.16 -recheck bmet in AM  Diabetes mellitus type 1 - Decreased home Lantus from 60 units three times daily to 40 units twice a day while hospitalized in a more controlled setting.  - As steroids are being started, increased lantus to 45 units bid - for now, will continue SSI coverage - glucose remains stable  CAD/history of CVA - For now, will continue Plavix and aspirin - remains stable  History of PSVT/ PAF:  -Eliquis was reported to be discontinued and patient was started on  Plavix. -currently stable  Essential hypertension  - Continue metoprolol as tolerated - BP improved with addition of low dose Ca channel blocker - Presently stable   Hyperlipidemia - Continue simvastatin as toelrated - currently stable  DVT prophylaxis: Lovenox subQ Code Status: Full Family  Communication: Pt in room, family not at bedside Disposition Plan: Anticipate SNF, timing uncertain  Consultants:    Procedures:     Antimicrobials: Anti-infectives    Start     Dose/Rate Route Frequency Ordered Stop   12/10/16 1500  cefTAZidime (FORTAZ) 2 g in dextrose 5 % 50 mL IVPB     2 g 100 mL/hr over 30 Minutes Intravenous Every 12 hours 12/10/16 1242     12/07/16 1800  cefTAZidime (FORTAZ) 2 g in dextrose 5 % 50 mL IVPB  Status:  Discontinued     2 g 100 mL/hr over 30 Minutes Intravenous Every 8 hours 12/07/16 1740 12/10/16 1242   12/06/16 1000  terbinafine (LAMISIL) tablet 250 mg    Comments:  continous     250 mg Oral Daily 12/05/16 2229     12/06/16 0300  clindamycin (CLEOCIN) IVPB 600 mg  Status:  Discontinued     600 mg 100 mL/hr over 30 Minutes Intravenous Every 8 hours 12/05/16 2229 12/07/16 1731   12/05/16 1900  clindamycin (CLEOCIN) IVPB 600 mg     600 mg 100 mL/hr over 30 Minutes Intravenous  Once 12/05/16 1859 12/05/16 2010      Subjective: Without complaints  Objective: Vitals:   12/09/16 2118 12/10/16 0450 12/10/16 0921 12/10/16 1427  BP: (!) 150/78 (!) 131/94  (!) 170/77  Pulse: (!) 102 95  98  Resp: 19 (!) 26  (!) 24  Temp: 98.3 F (36.8 C) 97.7 F (36.5 C)  98.2 F (36.8 C)  TempSrc: Oral Oral  Oral  SpO2: 92% 96% 92% 94%  Weight:      Height:        Intake/Output Summary (Last 24 hours) at 12/10/16 1448 Last data filed at 12/10/16 1100  Gross per 24 hour  Intake              530 ml  Output              450 ml  Net               80 ml   Filed Weights   12/05/16 1707 12/06/16 0000  Weight: 125.2 kg (276 lb) 132.7 kg (292 lb 8.8 oz)    Examination:  General exam: laying in bed, awake, in inad Respiratory system: coarse breath sounds, limited air movement, end-expiratory wheezing Cardiovascular system: regular rhythm, s1-s2 Gastrointestinal system:obese, soft, pos BS Central nervous system: no seizures, no tremors Extremities:  no clubbing, no joint deformities Skin: no pallor, B LE venous stasis skin changes, ulcers healing with minimal surrounding erythema much improved since admission Psychiatry: affect normal// no auditory hallucinations   Data Reviewed: I have personally reviewed following labs and imaging studies  CBC:  Recent Labs Lab 12/05/16 1741 12/06/16 0539 12/07/16 0536 12/09/16 0528 12/10/16 0530  WBC 13.5* 12.4* 11.0* 13.5* 9.8  NEUTROABS 10.5*  --   --   --   --   HGB 11.4* 11.0* 10.9* 11.3* 11.6*  HCT 36.1* 35.2* 35.3* 37.2* 37.3*  MCV 68.8* 68.5* 68.8* 70.2* 69.6*  PLT 240 235 251 291 [id number]   Basic Metabolic Panel:  Recent Labs Lab 12/06/16 0539 12/07/16 0536 12/08/16 0517 12/09/16 0528 12/10/16 0530  NA 139 141 141 143 139  K 3.2* 4.2 4.8 4.4 4.4  CL 103 105 105 104 102  CO2 30 29 30  32 31  GLUCOSE 65 136* 179* 117* 117*  BUN 22* 22* 22* 27* 28*  CREATININE 1.33* 1.24 1.16 1.19 1.16  CALCIUM 8.8* 8.9 9.4 9.5 9.2   GFR: Estimated Creatinine Clearance: 67.1 mL/min (by C-G formula based on SCr of 1.16 mg/dL). Liver Function Tests:  Recent Labs Lab 12/09/16 0528  AST 20  ALT 21  ALKPHOS 68  BILITOT 0.4  PROT 6.7  ALBUMIN 3.0*   No results for input(s): LIPASE, AMYLASE in the last 168 hours. No results for input(s): AMMONIA in the last 168 hours. Coagulation Profile: No results for input(s): INR, PROTIME in the last 168 hours. Cardiac Enzymes: No results for input(s): CKTOTAL, CKMB, CKMBINDEX, TROPONINI in the last 168 hours. BNP (last 3 results) No results for input(s): PROBNP in the last 8760 hours. HbA1C: No results for input(s): HGBA1C in the last 72 hours. CBG:  Recent Labs Lab 12/08/16 2108 12/09/16 0748 12/09/16 1145 12/09/16 1641 12/10/16 1146  GLUCAP 158* 96 115* 175* 155*   Lipid Profile: No results for input(s): CHOL, HDL, LDLCALC, TRIG, CHOLHDL, LDLDIRECT in the last 72 hours. Thyroid Function Tests: No results for input(s): TSH, T4TOTAL,  FREET4, T3FREE, THYROIDAB in the last 72 hours. Anemia Panel: No results for input(s): VITAMINB12, FOLATE, FERRITIN, TIBC, IRON, RETICCTPCT in the last 72 hours. Sepsis Labs:  Recent Labs Lab 12/05/16 1752 12/05/16 2156  LATICACIDVEN 1.24 1.36    Recent Results (from the past 240 hour(s))  Blood culture (routine x 2)     Status: None   Collection Time: 12/05/16  5:45 PM  Result Value Ref Range Status   Specimen Description BLOOD RIGHT ARM  Final   Special Requests IN PEDIATRIC BOTTLE 5CC  Final   Culture   Final    NO GROWTH 5 DAYS Performed at [facility], [place] [address], [place], [place] [address]    Report Status 12/10/2016 FINAL  Final  Blood culture (routine x 2)     Status: None   Collection Time: 12/05/16  5:45 PM  Result Value Ref Range Status   Specimen Description RIGHT ANTECUBITAL  Final   Special Requests BOTTLES DRAWN AEROBIC AND ANAEROBIC 5CC  Final   Culture   Final    NO GROWTH 5 DAYS Performed at [facility], [place] [address] [place], [place] [address]    Report Status 12/10/2016 FINAL  Final  Wound or Superficial Culture     Status: None   Collection Time: 12/05/16  7:20 PM  Result Value Ref Range Status   Specimen Description LEG RIGHT  Final   Special Requests NONE  Final   Gram Stain   Final    FEW WBC PRESENT, PREDOMINANTLY PMN NO ORGANISMS SEEN Performed at [facility], [place] [address], [place], [place] [address]    Culture   Final    ABUNDANT PSEUDOMONAS AERUGINOSA FEW PROVIDENCIA RETTGERI    Report Status 12/10/2016 FINAL  Final   Organism ID, Bacteria PSEUDOMONAS AERUGINOSA  Final   Organism ID, Bacteria PROVIDENCIA RETTGERI  Final      Susceptibility   Pseudomonas aeruginosa - MIC*    CEFTAZIDIME 4 SENSITIVE Sensitive     CIPROFLOXACIN <=0.25 SENSITIVE Sensitive     GENTAMICIN <=1 SENSITIVE Sensitive     IMIPENEM 1 SENSITIVE Sensitive     PIP/TAZO 8 SENSITIVE  Sensitive     CEFEPIME 4 SENSITIVE Sensitive     *  ABUNDANT PSEUDOMONAS AERUGINOSA   Providencia rettgeri - MIC*    AMPICILLIN >=32 RESISTANT Resistant     CEFAZOLIN >=64 RESISTANT Resistant     CEFEPIME <=1 SENSITIVE Sensitive     CEFTAZIDIME <=1 SENSITIVE Sensitive     CEFTRIAXONE <=1 SENSITIVE Sensitive     CIPROFLOXACIN <=0.25 SENSITIVE Sensitive     GENTAMICIN 2 SENSITIVE Sensitive     IMIPENEM 2 SENSITIVE Sensitive     TRIMETH/SULFA <=20 SENSITIVE Sensitive     AMPICILLIN/SULBACTAM >=32 RESISTANT Resistant     PIP/TAZO <=4 SENSITIVE Sensitive     * FEW PROVIDENCIA RETTGERI     Radiology Studies: No results found.  Scheduled Meds: . amitriptyline  25 mg Oral QHS  . amLODipine  2.5 mg Oral Daily  . aspirin EC  81 mg Oral Daily  . buPROPion  150 mg Oral Daily  . cefTAZidime (FORTAZ)  IV  2 g Intravenous Q12H  . clopidogrel  75 mg Oral Q breakfast  . enoxaparin (LOVENOX) injection  60 mg Subcutaneous QHS  . fluticasone  1 spray Each Nare Daily  . gabapentin  300 mg Oral TID  . insulin aspart  0-15 Units Subcutaneous TID WC  . insulin aspart  0-5 Units Subcutaneous QHS  . insulin glargine  45 Units Subcutaneous BID  . ipratropium-albuterol  3 mL Nebulization BID  . metoprolol tartrate  25 mg Oral BID  . pantoprazole  40 mg Oral Daily  . potassium chloride SA  20 mEq Oral Daily  . predniSONE  60 mg Oral Q breakfast  . senna-docusate  1 tablet Oral Daily  . simvastatin  20 mg Oral Daily  . terazosin  4 mg Oral Daily  . terbinafine  250 mg Oral Daily   Continuous Infusions:   LOS: 4 days   Desirae Mancusi, Orpah Melter, MD [facility] Pager [phone]  If 7PM-7AM, please contact night-coverage [email or web address] Password [facility] 12/10/2016, 2:48 PM

## 2016-12-10 NOTE — Progress Notes (Signed)
RT note:  Patient continues to refuse to wear CPAP QHS.

## 2016-12-10 NOTE — Progress Notes (Addendum)
Pharmacy Antibiotic Note  [name] is a 83 y.o. male admitted on 12/05/2016 with medical history significant ofHTN, HLD, CVA, CHF last EF showed 55% in4/2016, DM type II, b/l lower extremity edema;who presents with worsening lower extremity swelling. Pharmacy has been consulted for ceftazidime dosing for pseudomonas and providencia rettgeri cellulitis.  Today, 12/10/2016: - afeb, wbc down wnl - scr 1.16 (crcl~49 N)   Plan: - Will change cefepime dose to 2 gm IV q12h for renal function - If to switch to PO abx, can start cipro 500 mg PO q12h  __________________________________  Height: 5\' 11"  (180.3 cm) Weight: 292 lb 8.8 oz (132.7 kg) IBW/kg (Calculated) : 75.3  Temp (24hrs), Avg:98 F (36.7 C), Min:97.7 F (36.5 C), Max:98.3 F (36.8 C)   Recent Labs Lab 12/05/16 1741 12/05/16 1752 12/05/16 2156 12/06/16 0539 12/07/16 0536 12/08/16 0517 12/09/16 0528 12/10/16 0530  WBC 13.5*  --   --  12.4* 11.0*  --  13.5* 9.8  CREATININE 1.43*  --   --  1.33* 1.24 1.16 1.19 1.16  LATICACIDVEN  --  1.24 1.36  --   --   --   --   --     Estimated Creatinine Clearance: 67.1 mL/min (by C-G formula based on SCr of 1.16 mg/dL).    Allergies  Allergen Reactions  . Flomax [Tamsulosin Hcl] Other (See Comments)    Excessive tearing  . Glucophage [Metformin Hcl] Diarrhea    Antimicrobials this admission:  1/17 clinda>> 1/18 1/17 cefepime>>   Microbiology results:  1/16 BCx x2:  1/16 right leg wound: abundant PsA (pans sensitive); providencia rettgeri (R= amp, unasyn, ancef) FINAL  Thank you for allowing pharmacy to be a part of this patient's care.  Lynelle Doctor 12/10/2016 11:58 AM

## 2016-12-11 LAB — BASIC METABOLIC PANEL
Anion gap: 6 (ref 5–15)
BUN: 31 mg/dL — ABNORMAL HIGH (ref 6–20)
CALCIUM: 9.4 mg/dL (ref 8.9–10.3)
CO2: 34 mmol/L — ABNORMAL HIGH (ref 22–32)
CREATININE: 1.24 mg/dL (ref 0.61–1.24)
Chloride: 100 mmol/L — ABNORMAL LOW (ref 101–111)
GFR calc non Af Amer: 52 mL/min — ABNORMAL LOW (ref >60)
Glucose, Bld: 122 mg/dL — ABNORMAL HIGH (ref 65–99)
Potassium: 4.3 mmol/L (ref 3.5–5.1)
SODIUM: 140 mmol/L (ref 135–145)

## 2016-12-11 LAB — GLUCOSE, CAPILLARY
GLUCOSE-CAPILLARY: 198 mg/dL — AB (ref 65–99)
Glucose-Capillary: 180 mg/dL — ABNORMAL HIGH (ref 65–99)
Glucose-Capillary: 108 mg/dL — ABNORMAL HIGH (ref 65–99)
Glucose-Capillary: 260 mg/dL — ABNORMAL HIGH (ref 65–99)
Glucose-Capillary: 208 mg/dL — ABNORMAL HIGH (ref 65–99)

## 2016-12-11 MED ORDER — LEVOFLOXACIN 750 MG PO TABS
750.0000 mg | ORAL_TABLET | Freq: Every day | ORAL | Status: DC
  Administered 2016-12-11 – 2016-12-15 (×5): 750 mg via ORAL
  Filled 2016-12-11 (×5): qty 1

## 2016-12-11 MED ORDER — INSULIN GLARGINE 100 UNIT/ML ~~LOC~~ SOLN
50.0000 [IU] | Freq: Two times a day (BID) | SUBCUTANEOUS | Status: DC
  Administered 2016-12-11 – 2016-12-12 (×4): 50 [IU] via SUBCUTANEOUS
  Filled 2016-12-11 (×5): qty 0.5

## 2016-12-11 MED ORDER — AMLODIPINE BESYLATE 5 MG PO TABS
5.0000 mg | ORAL_TABLET | Freq: Every day | ORAL | Status: DC
  Administered 2016-12-11 – 2016-12-15 (×5): 5 mg via ORAL
  Filled 2016-12-11 (×5): qty 1

## 2016-12-11 NOTE — Progress Notes (Signed)
Patient continues to decline nocturnal CPAP. RT will continue to follow.  

## 2016-12-11 NOTE — Care Management Important Message (Signed)
Important Message  Patient Details  Name: ASTOR DOLCH MRN: [id number] Date of Birth: [birth year]   Medicare Important Message Given:  Yes    Kerin Salen 12/11/2016, 11:55 AMImportant Message  Patient Details  Name: HASCAL GARROD MRN: [id number] Date of Birth: Jul 22, 1931   Medicare Important Message Given:  Yes    Kerin Salen 12/11/2016, 11:55 AM

## 2016-12-11 NOTE — Progress Notes (Signed)
Attempted to place patient on cpap. He said he would wear it. After placing the cpap he immediately asked me to remove it said he cannot tolerate it. Pt is on 2L nasal  Cannula.

## 2016-12-11 NOTE — Progress Notes (Signed)
Physical Therapy Treatment Patient Details Name: GABRIELA GREENSTONE MRN: [id number] DOB: Dec 17, 1930 Today's Date: 12/11/2016    History of Present Illness Pt is an 83 year old male admitted for cellulitis.  PMH significant for CVA, CHF, DM type 2, bil LE edema    PT Comments    Progressing with mobility. Pt was able to ambulate ~40 feet on today. O2 sats 82% on RA during ambulation. No family present during session. Continue to recommend SNF for rehab if pt will agree.   Follow Up Recommendations  SNF (24 hour care IF pt returns home)     Equipment Recommendations  None recommended by PT    Recommendations for Other Services       Precautions / Restrictions Precautions Precautions: Fall Precaution Comments: HOH; monitor O2 sats Restrictions Weight Bearing Restrictions: No    Mobility  Bed Mobility               General bed mobility comments: oob in recliner  Transfers Overall transfer level: Needs assistance Equipment used: Rolling walker (2 wheeled) Transfers: Sit to/from Stand Sit to Stand: Min assist         General transfer comment: Assist to rise, stabilize, control descent. VCs safety, hand placement.   Ambulation/Gait Ambulation/Gait assistance: Min assist Ambulation Distance (Feet): 40 Feet Assistive device: Rolling walker (2 wheeled) Gait Pattern/deviations: Step-through pattern;Decreased stride length     General Gait Details: Assist to stabilize throughout distance. O2 sats 82% on RA during ambulation. Wheezing and dyspnea noted.    Stairs            Wheelchair Mobility    Modified Rankin (Stroke Patients Only)       Balance                                    Cognition Arousal/Alertness: Awake/alert Behavior During Therapy: WFL for tasks assessed/performed Overall Cognitive Status: Within Functional Limits for tasks assessed                      Exercises      General Comments        Pertinent  Vitals/Pain Pain Assessment: No/denies pain    Home Living                      Prior Function            PT Goals (current goals can now be found in the care plan section) Progress towards PT goals: Progressing toward goals    Frequency    Min 3X/week      PT Plan Current plan remains appropriate    Co-evaluation             End of Session Equipment Utilized During Treatment: Gait belt Activity Tolerance: Patient tolerated treatment well Patient left: in chair;with call bell/phone within reach;with chair alarm set     Time: [id number] PT Time Calculation (min) (ACUTE ONLY): 12 min  Charges:  $Gait Training: 8-22 mins                    G Codes:      Weston Anna, MPT Pager: [phone]

## 2016-12-11 NOTE — Progress Notes (Signed)
PROGRESS NOTE    Jared Hall  [id number] DOB: 1931/04/06 DOA: 12/05/2016 PCP: Henrine Screws, MD    Brief Narrative:   83 y.o. male with medical history significant of HTN, HLD, CVA, CHF last EF showed 55% in 02/2015, DM type II, b/l lower extremity edema; who presents with worsening lower extremity swelling. Patient was sent to the ED 7 days ago for reports of his right fifth digit turning black. He was recommended to increase Lasix to 40 mg twice daily. 4 days ago he saw his PCP who started him on Augmentin which she's been taking twice daily as prescribed. He reports that he noticed that his leg started to weep yesterday. His home health nurses evaluated him reported worsening erythema and drainage from the legs. Associated symptoms include worsening dyspnea with exertion, orthopnea, and weight gain. He denies having any fevers, chills, or chest pain.  Upon admission patient was evaluated and seen to be afebrile, heart rates up to 107, and all other vitals relatively within normal limits. Patient was started on clindamycin in the ED . Blood and wound cultures obtained.  Assessment & Plan:   Principal Problem:   Cellulitis Active Problems:   History of CVA (cerebrovascular accident)   Essential hypertension, benign   Type 1 diabetes mellitus with peripheral circulatory complications (HCC)   Paroxysmal atrial fibrillation (HCC)   Chronic kidney disease, stage III (moderate)   Cellulitis of B LE with sepsis present on admission:  -Patient presents tachycardic, tachypneic, WBC of 13.5, and lower extremity redness and swelling. Cultures obtained in ED. Patient started on clindamycin and area of erythema was marked. - cultures obtained, demonstrating abundant pseudomonas species -Patient had been continued on clindamycin empirically - Patient is now continued on ceftaz for better pseudomonas coverage - Wound care consulted, appreciate input - Cultures reviewed. Pseudomonas noted to  be pan-sensitive, including ciprofloxacin - Cellulitis appears much improved since admission. Transitioned to cipro - Given concerns of possible PNA, have changed to daily levaquin. Discussed with pharmacy  Bilateral lower extremity swelling, likely secondary to chronic venous stasis, not CHF - Continue to levate bilateral lower extremity - 2d echo done, normal LVEF without significant stensis - Patient was given 40 mg of Lasix IV 1 dose - Continue to hold off on further diuretics for now - Family had reported that pt was noncompliant with keeping LE elevated prior to admission - Much improved  Dyspnea - Chart reviewed. Patient has a hx of chronic sob over the past several months. Documented hx of chronic diastolic CHF - Continuous pulse oximetry with nasal cannula oxygen as needed. - coarse BS on exam with limited air movement - Continue scheduled duonebs q6hrs, q2prn - For now, continue prednisone 60mg  daily - Labs reviewed. BNP up to 119. Have started patient on IV lasix as he is more O2 dependent - CT chest personally reviewed. Possible L sided consolidation noted with atalectasis - Changed abx to levaquin per above  Chronic kidney disease stage III:  -Cr stable today 1.24 -Will repeat bmet in AM  Diabetes mellitus type 1 - Decreased home Lantus from 60 units three times daily to 40 units twice a day while hospitalized in a more controlled setting.  - glucose in the 200's - for now, will continue SSI coverage - will increase lantus to 50 units bid  CAD/history of CVA - For now, will continue Plavix and aspirin - Patient remains stable  History of PSVT/ PAF:  -Eliquis was reported to be discontinued and  patient was started on Plavix. -at present stable  Essential hypertension  - Continue metoprolol as tolerated - BP improved with addition of low dose Ca channel blocker however still suboptimal - Increase norvasc to 5mg    Hyperlipidemia - Continue simvastatin  as toelrated - remains stable  DVT prophylaxis: Lovenox subQ Code Status: Full Family Communication: Pt in room, family not at bedside, had discussed with wife over phone Disposition Plan: Anticipate SNF, timing uncertain  Consultants:    Procedures:     Antimicrobials: Anti-infectives    Start     Dose/Rate Route Frequency Ordered Stop   12/11/16 1000  levofloxacin (LEVAQUIN) tablet 750 mg     750 mg Oral Daily 12/11/16 0900     12/10/16 2000  ciprofloxacin (CIPRO) tablet 500 mg  Status:  Discontinued     500 mg Oral 2 times daily 12/10/16 1502 12/11/16 0900   12/10/16 1500  cefTAZidime (FORTAZ) 2 g in dextrose 5 % 50 mL IVPB  Status:  Discontinued     2 g 100 mL/hr over 30 Minutes Intravenous Every 12 hours 12/10/16 1242 12/10/16 1502   12/07/16 1800  cefTAZidime (FORTAZ) 2 g in dextrose 5 % 50 mL IVPB  Status:  Discontinued     2 g 100 mL/hr over 30 Minutes Intravenous Every 8 hours 12/07/16 1740 12/10/16 1242   12/06/16 1000  terbinafine (LAMISIL) tablet 250 mg    Comments:  continous     250 mg Oral Daily 12/05/16 2229     12/06/16 0300  clindamycin (CLEOCIN) IVPB 600 mg  Status:  Discontinued     600 mg 100 mL/hr over 30 Minutes Intravenous Every 8 hours 12/05/16 2229 12/07/16 1731   12/05/16 1900  clindamycin (CLEOCIN) IVPB 600 mg     600 mg 100 mL/hr over 30 Minutes Intravenous  Once 12/05/16 1859 12/05/16 2010      Subjective: No complaints at present  Objective: Vitals:   12/10/16 0921 12/10/16 1427 12/10/16 2151 12/11/16 0514  BP:  (!) 170/77 (!) 169/79 (!) 158/82  Pulse:  98 94 95  Resp:  (!) 24 (!) 25 (!) 22  Temp:  98.2 F (36.8 C) 98.3 F (36.8 C) 98.2 F (36.8 C)  TempSrc:  Oral Oral Oral  SpO2: 92% 94% 95% 93%  Weight:      Height:        Intake/Output Summary (Last 24 hours) at 12/11/16 0910 Last data filed at 12/11/16 0515  Gross per 24 hour  Intake               50 ml  Output             2826 ml  Net            -2776 ml   Filed  Weights   12/05/16 1707 12/06/16 0000  Weight: 125.2 kg (276 lb) 132.7 kg (292 lb 8.8 oz)    Examination:  General exam: conversant, in nad Respiratory system: milldy increased resp effort no audible wheezing Cardiovascular system: regular rate, s1-s2 on auscultation Gastrointestinal system:soft, pos BS, nondistended Central nervous system: cn2-12 grossly intact, strength intact Extremities: no cyanosis, no joint deformities Skin: no rashes, no pallor Psychiatry: mood normal// no visual hallucinations   Data Reviewed: I have personally reviewed following labs and imaging studies  CBC:  Recent Labs Lab 12/05/16 1741 12/06/16 0539 12/07/16 0536 12/09/16 0528 12/10/16 0530  WBC 13.5* 12.4* 11.0* 13.5* 9.8  NEUTROABS 10.5*  --   --   --   --  HGB 11.4* 11.0* 10.9* 11.3* 11.6*  HCT 36.1* 35.2* 35.3* 37.2* 37.3*  MCV 68.8* 68.5* 68.8* 70.2* 69.6*  PLT 240 235 251 291 [id number]   Basic Metabolic Panel:  Recent Labs Lab 12/07/16 0536 12/08/16 0517 12/09/16 0528 12/10/16 0530 12/11/16 0518  NA 141 141 143 139 140  K 4.2 4.8 4.4 4.4 4.3  CL 105 105 104 102 100*  CO2 29 30 32 31 34*  GLUCOSE 136* 179* 117* 117* 122*  BUN 22* 22* 27* 28* 31*  CREATININE 1.24 1.16 1.19 1.16 1.24  CALCIUM 8.9 9.4 9.5 9.2 9.4   GFR: Estimated Creatinine Clearance: 62.8 mL/min (by C-G formula based on SCr of 1.24 mg/dL). Liver Function Tests:  Recent Labs Lab 12/09/16 0528  AST 20  ALT 21  ALKPHOS 68  BILITOT 0.4  PROT 6.7  ALBUMIN 3.0*   No results for input(s): LIPASE, AMYLASE in the last 168 hours. No results for input(s): AMMONIA in the last 168 hours. Coagulation Profile: No results for input(s): INR, PROTIME in the last 168 hours. Cardiac Enzymes: No results for input(s): CKTOTAL, CKMB, CKMBINDEX, TROPONINI in the last 168 hours. BNP (last 3 results) No results for input(s): PROBNP in the last 8760 hours. HbA1C: No results for input(s): HGBA1C in the last 72  hours. CBG:  Recent Labs Lab 12/09/16 2156 12/10/16 1146 12/10/16 1719 12/10/16 2150 12/11/16 0802  GLUCAP 180* 155* 168* 211* 108*   Lipid Profile: No results for input(s): CHOL, HDL, LDLCALC, TRIG, CHOLHDL, LDLDIRECT in the last 72 hours. Thyroid Function Tests: No results for input(s): TSH, T4TOTAL, FREET4, T3FREE, THYROIDAB in the last 72 hours. Anemia Panel: No results for input(s): VITAMINB12, FOLATE, FERRITIN, TIBC, IRON, RETICCTPCT in the last 72 hours. Sepsis Labs:  Recent Labs Lab 12/05/16 1752 12/05/16 2156  LATICACIDVEN 1.24 1.36    Recent Results (from the past 240 hour(s))  Blood culture (routine x 2)     Status: None   Collection Time: 12/05/16  5:45 PM  Result Value Ref Range Status   Specimen Description BLOOD RIGHT ARM  Final   Special Requests IN PEDIATRIC BOTTLE 5CC  Final   Culture   Final    NO GROWTH 5 DAYS Performed at [facility], [place] [address] [place], [place] [address]    Report Status 12/10/2016 FINAL  Final  Blood culture (routine x 2)     Status: None   Collection Time: 12/05/16  5:45 PM  Result Value Ref Range Status   Specimen Description RIGHT ANTECUBITAL  Final   Special Requests BOTTLES DRAWN AEROBIC AND ANAEROBIC 5CC  Final   Culture   Final    NO GROWTH 5 DAYS Performed at [facility], [place] [address] [place], [place] [address]    Report Status 12/10/2016 FINAL  Final  Wound or Superficial Culture     Status: None   Collection Time: 12/05/16  7:20 PM  Result Value Ref Range Status   Specimen Description LEG RIGHT  Final   Special Requests NONE  Final   Gram Stain   Final    FEW WBC PRESENT, PREDOMINANTLY PMN NO ORGANISMS SEEN Performed at [facility], [place] [address], [place],  [address]    Culture   Final    ABUNDANT PSEUDOMONAS AERUGINOSA FEW PROVIDENCIA RETTGERI    Report Status 12/10/2016 FINAL  Final   Organism ID, Bacteria PSEUDOMONAS AERUGINOSA  Final   Organism ID, Bacteria  PROVIDENCIA RETTGERI  Final  Susceptibility   Pseudomonas aeruginosa - MIC*    CEFTAZIDIME 4 SENSITIVE Sensitive     CIPROFLOXACIN <=0.25 SENSITIVE Sensitive     GENTAMICIN <=1 SENSITIVE Sensitive     IMIPENEM 1 SENSITIVE Sensitive     PIP/TAZO 8 SENSITIVE Sensitive     CEFEPIME 4 SENSITIVE Sensitive     * ABUNDANT PSEUDOMONAS AERUGINOSA   Providencia rettgeri - MIC*    AMPICILLIN >=32 RESISTANT Resistant     CEFAZOLIN >=64 RESISTANT Resistant     CEFEPIME <=1 SENSITIVE Sensitive     CEFTAZIDIME <=1 SENSITIVE Sensitive     CEFTRIAXONE <=1 SENSITIVE Sensitive     CIPROFLOXACIN <=0.25 SENSITIVE Sensitive     GENTAMICIN 2 SENSITIVE Sensitive     IMIPENEM 2 SENSITIVE Sensitive     TRIMETH/SULFA <=20 SENSITIVE Sensitive     AMPICILLIN/SULBACTAM >=32 RESISTANT Resistant     PIP/TAZO <=4 SENSITIVE Sensitive     * FEW PROVIDENCIA RETTGERI     Radiology Studies: Ct Chest W Contrast  Result Date: 12/11/2016 CLINICAL DATA:  Shortness of breath and lower extremity edema EXAM: CT CHEST WITH CONTRAST TECHNIQUE: Multidetector CT imaging of the chest was performed during intravenous contrast administration. CONTRAST:  75 mL ISOVUE-300 IOPAMIDOL (ISOVUE-300) INJECTION 61% COMPARISON:  Chest radiograph chair 16 2018 FINDINGS: Cardiovascular: There is no demonstrable thoracic aortic aneurysm or dissection. There is moderate atherosclerotic calcification in the aorta. There are scattered foci of calcification in the proximal great vessels. There are multiple scattered foci of coronary artery calcification. Pericardium is not thickened. Mediastinum/Nodes: Thyroid appears unremarkable. There is fairly extensive mediastinal fat diffusely. There is no evident thoracic adenopathy. There is moderate esophageal wall thickening in the midesophagus. Lungs/Pleura: There is patchy atelectasis in the posterior segment left lower lobe as well as in portions of the inferior lingula and left lower lobe. Similar  atelectatic changes noted in the posterior right base. There is a small area of airspace consolidation in the posterior left base. There is no appreciable pleural effusion or pleural thickening. Upper Abdomen: There are small gallstones within the gallbladder. The visualized upper abdominal structures otherwise appear unremarkable. Musculoskeletal: There is degenerative change in the thoracic spine. There are no blastic or lytic bone lesions. IMPRESSION: Areas of patchy atelectasis, primarily on the left, with a small area of consolidation posterior left base. No evident adenopathy. Multiple foci of atherosclerotic calcification including multiple foci of coronary artery calcification. Several small gallstones noted in gallbladder. Thickening of the mid esophageal wall may be indicative of chronic reflux type esophagitis. Electronically Signed   By: Lowella Grip III M.D.   On: 12/11/2016 07:09    Scheduled Meds: . amitriptyline  25 mg Oral QHS  . amLODipine  5 mg Oral Daily  . aspirin EC  81 mg Oral Daily  . buPROPion  150 mg Oral Daily  . clopidogrel  75 mg Oral Q breakfast  . enoxaparin (LOVENOX) injection  60 mg Subcutaneous QHS  . fluticasone  1 spray Each Nare Daily  . furosemide  40 mg Intravenous Daily  . gabapentin  300 mg Oral TID  . insulin aspart  0-15 Units Subcutaneous TID WC  . insulin aspart  0-5 Units Subcutaneous QHS  . insulin glargine  50 Units Subcutaneous BID  . ipratropium-albuterol  3 mL Nebulization BID  . levofloxacin  750 mg Oral Daily  . metoprolol tartrate  25 mg Oral BID  . pantoprazole  40 mg Oral Daily  . potassium chloride SA  20 mEq Oral  Daily  . predniSONE  60 mg Oral Q breakfast  . senna-docusate  1 tablet Oral Daily  . simvastatin  20 mg Oral Daily  . terazosin  4 mg Oral Daily  . terbinafine  250 mg Oral Daily   Continuous Infusions:   LOS: 5 days   Tristan Proto, Orpah Melter, MD [facility] Pager [phone]  If 7PM-7AM, please contact  night-coverage [email or web address] Password [email or web address] 12/11/2016, 9:10 AM

## 2016-12-12 ENCOUNTER — Inpatient Hospital Stay (HOSPITAL_COMMUNITY): Payer: Medicare Other

## 2016-12-12 DIAGNOSIS — L0390 Cellulitis, unspecified: Secondary | ICD-10-CM

## 2016-12-12 LAB — BASIC METABOLIC PANEL
Anion gap: 7 (ref 5–15)
BUN: 34 mg/dL — AB (ref 6–20)
CALCIUM: 9.4 mg/dL (ref 8.9–10.3)
CHLORIDE: 98 mmol/L — AB (ref 101–111)
CO2: 36 mmol/L — ABNORMAL HIGH (ref 22–32)
CREATININE: 1.11 mg/dL (ref 0.61–1.24)
GFR calc non Af Amer: 59 mL/min — ABNORMAL LOW (ref >60)
Glucose, Bld: 67 mg/dL (ref 65–99)
Potassium: 4.5 mmol/L (ref 3.5–5.1)
SODIUM: 141 mmol/L (ref 135–145)

## 2016-12-12 LAB — GLUCOSE, CAPILLARY
GLUCOSE-CAPILLARY: 51 mg/dL — AB (ref 65–99)
GLUCOSE-CAPILLARY: 71 mg/dL (ref 65–99)
GLUCOSE-CAPILLARY: 230 mg/dL — AB (ref 65–99)
GLUCOSE-CAPILLARY: 170 mg/dL — AB (ref 65–99)
Glucose-Capillary: 209 mg/dL — ABNORMAL HIGH (ref 65–99)

## 2016-12-12 MED ORDER — IPRATROPIUM-ALBUTEROL 0.5-2.5 (3) MG/3ML IN SOLN
3.0000 mL | Freq: Three times a day (TID) | RESPIRATORY_TRACT | Status: DC
  Administered 2016-12-13: 3 mL via RESPIRATORY_TRACT
  Filled 2016-12-12: qty 3

## 2016-12-12 MED ORDER — IPRATROPIUM-ALBUTEROL 0.5-2.5 (3) MG/3ML IN SOLN: 3.0000 mL | Freq: Four times a day (QID) | RESPIRATORY_TRACT | Status: DC | PRN

## 2016-12-12 NOTE — Progress Notes (Signed)
Inpatient Diabetes Program Recommendations  AACE/ADA: New Consensus Statement on Inpatient Glycemic Control (2015)  Target Ranges:  Prepandial:   less than 140 mg/dL      Peak postprandial:   less than 180 mg/dL (1-2 hours)      Critically ill patients:  140 - 180 mg/dL   Lab Results  Component Value Date   GLUCAP 71 12/12/2016   HGBA1C 8.5 (H) 03/15/2015    Review of Glycemic Control  FBS < 70 mg/dL this am. Post-prandials slightly elevated. Needs insulin adjustment.  Inpatient Diabetes Program Recommendations:    Decrease Lantus to 45 units QHS Add Novolog 3 units tidwc if pt eats > 50% meal.  Will continue to follow. Thank you. Lorenda Peck, RD, LDN, CDE Inpatient Diabetes Coordinator [phone]

## 2016-12-12 NOTE — Progress Notes (Signed)
Patient continues to decline nocturnal CPAP. Equipment remains at bedside for if he should become more compliant to try. RT will continue to follow.

## 2016-12-12 NOTE — Progress Notes (Signed)
Hypoglycemic Event  CBG: 51  Treatment: 15 GM carbohydrate snack  Symptoms: None  Follow-up CBG: Time:0759 CBG Result:71  Possible Reasons for Event: Unknown  Comments/MD notified: Dr. Herby Abraham, Larence Penning

## 2016-12-12 NOTE — Consult Note (Signed)
   [facility] CM Inpatient Consult   12/12/2016  DARLY KOS 04/07/1931 [id number]    [facility] Care Management follow up. Mr. Maye is active with [facility] services. Went to bedside to speak with family. However, family was not present and Mr. Tanguma was asleep. Chart reviewed and noted that SNF is recommended. Spoke with Kenneth who indicates that SNF is a good idea as Mr. Westland lives alone. Attempted to contact [name], daughter in law, at [phone] to discuss [facility] LCSW follow up while at [facility]. Left voicemail message to request call back. Will go ahead and make referral for [facility] LCSW to follow up while at [facility]. Spoke with inpatient RNCM to discuss.   Marthenia Rolling, MSN-Ed, RN,BSN [facility] Liaison [phone]

## 2016-12-12 NOTE — [place] FL2 (Signed)
[place] MEDICAID FL2 LEVEL OF CARE SCREENING TOOL     IDENTIFICATION  Patient Name: Jared Hall Birthdate: 1931-08-19 Sex: male Admission Date (Current Location): 12/05/2016  [facility] and [place] Number:  Herbalist and Address:  [facility],  [place], [place]      Provider Number: [id number]  Attending Physician Name and Address:  Donne Hazel, MD  Relative Name and Phone Number:       Current Level of Care: Hospital Recommended Level of Care: [place] Prior Approval Number:    Date Approved/Denied:   PASRR Number:  ([id number])  Discharge Plan: SNF    Current Diagnoses: Patient Active Problem List   Diagnosis Date Noted  . Chronic kidney disease, stage III (moderate) 12/06/2016  . Bell's palsy   . Facial droop   . Paroxysmal atrial fibrillation (HCC)   . Embolic stroke ([place])   . Dysphagia 03/14/2015  . Dysarthria 03/14/2015  . Stroke ([place]) 03/14/2015  . Cellulitis 01/11/2014  . Bilateral lower extremity edema 01/11/2014  . Venous stasis dermatitis 01/11/2014  . DM type 2 with diabetic peripheral neuropathy ([place]) 01/11/2014  . Cellulitis and abscess of leg 01/11/2014  . Cellulitis and abscess 01/11/2014  . Depression 06/15/2013  . GERD (gastroesophageal reflux disease) 06/15/2013  . Constipation 06/15/2013  . Diabetic neuropathy ([place]) 06/15/2013  . Type 1 diabetes mellitus with peripheral circulatory complications ([place]) 123456  . Essential hypertension, benign 04/07/2013  . AKI (acute kidney injury) ([place]) 04/04/2013  . Cellulitis of lower extremity 04/04/2013  . Right hip pain 04/04/2013  . Renal insufficiency 12/16/2011  . History of CVA (cerebrovascular accident) 12/16/2011  . Dyslipidemia 12/16/2011  . Obesity (BMI 30-39.9) 12/16/2011  . Umbilical hernia with obstruction-partial on CT scan; easily reducible 12/14/2011  . History of PSVT (paroxysmal supraventricular tachycardia) 12/14/2011   . DM (diabetes mellitus) ([place]) 12/14/2011    Orientation RESPIRATION BLADDER Height & Weight     Self, Time, Situation, Place  O2 (at 2L) Incontinent, External catheter Weight: 292 lb 8.8 oz (132.7 kg) Height:  5\' 11"  (180.3 cm)  BEHAVIORAL SYMPTOMS/MOOD NEUROLOGICAL BOWEL NUTRITION STATUS   (none )  (none ) Incontinent Diet (Heart Healthy )  AMBULATORY STATUS COMMUNICATION OF NEEDS Skin   Limited Assist Verbally Other (Comment) (Cellulitis- Left Leg with dressing changes PRN )                       Personal Care Assistance Level of Assistance  Dressing, Feeding, Bathing Bathing Assistance: Limited assistance Feeding assistance: Independent Dressing Assistance: Limited assistance     Functional Limitations Info  Speech, Hearing, Sight Sight Info: Adequate Hearing Info: Adequate Speech Info: Adequate    SPECIAL CARE FACTORS FREQUENCY  PT (By licensed PT), OT (By licensed OT)     PT Frequency: 3 OT Frequency: 2            Contractures      Additional Factors Info  Code Status, Allergies Code Status Info: FULL CODE  Allergies Info: Flomax Tamsulosin Hcl, Glucophage Metformin Hcl           Current Medications (12/12/2016):  This is the current hospital active medication list Current Facility-Administered Medications  Medication Dose Route Frequency Provider Last Rate Last Dose  . acetaminophen (TYLENOL) tablet 650 mg  650 mg Oral Q6H PRN Norval Morton, MD       Or  . acetaminophen (TYLENOL) suppository 650 mg  650 mg  Rectal Q6H PRN Norval Morton, MD      . amitriptyline (ELAVIL) tablet 25 mg  25 mg Oral QHS Norval Morton, MD   25 mg at 12/11/16 2150  . amLODipine (NORVASC) tablet 5 mg  5 mg Oral Daily Donne Hazel, MD   5 mg at 12/12/16 0936  . aspirin EC tablet 81 mg  81 mg Oral Daily Norval Morton, MD   81 mg at 12/12/16 0936  . buPROPion [facility] SR) 12 hr tablet 150 mg  150 mg Oral Daily Norval Morton, MD   150 mg at 12/12/16 0937  .  clopidogrel (PLAVIX) tablet 75 mg  75 mg Oral Q breakfast Norval Morton, MD   75 mg at 12/12/16 0744  . clotrimazole-betamethasone (LOTRISONE) lotion 1 application  1 application Topical BID PRN Norval Morton, MD      . enoxaparin (LOVENOX) injection 60 mg  60 mg Subcutaneous QHS Leann T Poindexter, RPH   60 mg at 12/11/16 2150  . fluticasone (FLONASE) 50 MCG/ACT nasal spray 1 spray  1 spray Each Nare Daily Norval Morton, MD   1 spray at 12/12/16 0932  . furosemide (LASIX) injection 40 mg  40 mg Intravenous Daily Donne Hazel, MD   40 mg at 12/12/16 0932  . gabapentin (NEURONTIN) capsule 300 mg  300 mg Oral TID Norval Morton, MD   300 mg at 12/12/16 0936  . HYDROcodone-acetaminophen (NORCO) 7.5-325 MG per tablet 2 tablet  2 tablet Oral TID PRN Norval Morton, MD   2 tablet at 12/07/16 1814  . insulin aspart (novoLOG) injection 0-15 Units  0-15 Units Subcutaneous TID WC Norval Morton, MD   8 Units at 12/11/16 1803  . insulin aspart (novoLOG) injection 0-5 Units  0-5 Units Subcutaneous QHS Norval Morton, MD   2 Units at 12/11/16 2149  . insulin glargine (LANTUS) injection 50 Units  50 Units Subcutaneous BID Donne Hazel, MD   50 Units at 12/12/16 [phone]  . ipratropium-albuterol (DUONEB) 0.5-2.5 (3) MG/3ML nebulizer solution 3 mL  3 mL Nebulization Q2H PRN Donne Hazel, MD   3 mL at 12/09/16 0500  . ipratropium-albuterol (DUONEB) 0.5-2.5 (3) MG/3ML nebulizer solution 3 mL  3 mL Nebulization BID Donne Hazel, MD   3 mL at 12/12/16 0803  . levofloxacin (LEVAQUIN) tablet 750 mg  750 mg Oral Daily Donne Hazel, MD   750 mg at 12/12/16 0941  . metoprolol tartrate (LOPRESSOR) tablet 25 mg  25 mg Oral BID Norval Morton, MD   25 mg at 12/12/16 0936  . ondansetron (ZOFRAN) tablet 4 mg  4 mg Oral Q6H PRN Norval Morton, MD       Or  . ondansetron (ZOFRAN) injection 4 mg  4 mg Intravenous Q6H PRN Rondell A Tamala Julian, MD      . pantoprazole (PROTONIX) EC tablet 40 mg  40 mg Oral Daily Norval Morton, MD   40 mg at 12/12/16 0937  . potassium chloride SA (K-DUR,KLOR-CON) CR tablet 20 mEq  20 mEq Oral Daily Norval Morton, MD   20 mEq at 12/12/16 0936  . predniSONE (DELTASONE) tablet 60 mg  60 mg Oral Q breakfast Donne Hazel, MD   60 mg at 12/12/16 0744  . senna-docusate (Senokot-S) tablet 1 tablet  1 tablet Oral Daily Norval Morton, MD   1 tablet at 12/12/16 [phone]  . simvastatin (ZOCOR) tablet 20 mg  20 mg Oral Daily Norval Morton, MD   20 mg at 12/12/16 0936  . terazosin (HYTRIN) capsule 4 mg  4 mg Oral Daily Norval Morton, MD   4 mg at 12/12/16 0933  . terbinafine (LAMISIL) tablet 250 mg  250 mg Oral Daily Norval Morton, MD   250 mg at 12/12/16 0936  . zolpidem (AMBIEN) tablet 5 mg  5 mg Oral QHS PRN Norval Morton, MD         Discharge Medications: Please see discharge summary for a list of discharge medications.  Relevant Imaging Results:  Relevant Lab Results:   Additional Information SSN [id number]  Rozell Searing

## 2016-12-12 NOTE — Progress Notes (Addendum)
PROGRESS NOTE    Jared Hall  [id number] DOB: 1931/05/31 DOA: 12/05/2016 PCP: Henrine Screws, MD    Brief Narrative:   83 y.o. male with medical history significant of HTN, HLD, CVA, CHF last EF showed 55% in 02/2015, DM type II, b/l lower extremity edema; who presents with worsening lower extremity swelling. Patient was sent to the ED 7 days ago for reports of his right fifth digit turning black. He was recommended to increase Lasix to 40 mg twice daily. 4 days ago he saw his PCP who started him on Augmentin which she's been taking twice daily as prescribed. He reports that he noticed that his leg started to weep yesterday. His home health nurses evaluated him reported worsening erythema and drainage from the legs. Associated symptoms include worsening dyspnea with exertion, orthopnea, and weight gain. He denies having any fevers, chills, or chest pain.  Upon admission patient was evaluated and seen to be afebrile, heart rates up to 107, and all other vitals relatively within normal limits. Patient was started on clindamycin in the ED . Blood and wound cultures obtained.  During this hospital course, patient was continued on abx with eventual improvement/resolution of LE cellulitis. Patient was noted to have increased work of breathing with CXR being unremarkable and chest CT found to be largely unremarkable. Patient has since been continued on scheduled lasix and duonebs. Pt was observed to have periods of apnea while sleeping, however has not tolerated trial of CPAP this admission.  Assessment & Plan:   Principal Problem:   Cellulitis Active Problems:   History of CVA (cerebrovascular accident)   Essential hypertension, benign   Type 1 diabetes mellitus with peripheral circulatory complications (HCC)   Paroxysmal atrial fibrillation (HCC)   Chronic kidney disease, stage III (moderate)   Cellulitis of B LE with sepsis present on admission:  -Patient presents tachycardic,  tachypneic, WBC of 13.5, and lower extremity redness and swelling. Cultures obtained in ED. Patient started on clindamycin and area of erythema was marked. - Wound cultures obtained, demonstrating abundant pseudomonas and providencia species - Wound care consulted, appreciate input - Patient was continued on cipro - Given concerns of possible PNA, have continued on daily levaquin  Bilateral lower extremity swelling, likely secondary to chronic venous stasis, likely not related to CHF - Continue to levate bilateral lower extremity - 2d echo done, normal LVEF without significant stensis - Family had reported that pt was noncompliant with keeping LE elevated prior to admission - LE edema much improved - Patient continues to report post calf tenderness. Will check LE dopplers to r/o DVT  Dyspnea - Chart reviewed. Patient has a hx of chronic sob over the past several months. Documented hx of chronic diastolic CHF - Continue scheduled duonebs q6hrs, q2prn - For now, continue prednisone 60mg  daily - BNP up to 119. Have started patient on IV lasix as he is more O2 dependent - CT chest personally reviewed. Possible L sided consolidation noted with atalectasis - Changed abx to levaquin per above - given mildly increased resp effort, have continued on lasix IV. - Repeat bmet in AM.  Chronic kidney disease stage III:  -Cr stable today improved to 1.11 -Will recheck bmet in AM  Diabetes mellitus type 1 - Decreased home Lantus from 60 units three times daily to 40 units twice a day while hospitalized in a more controlled setting.  - glucose down to the 50's this AM - for now, will continue SSI coverage - decrease lantus to  45 units. Encourage PO intake as tolerated  CAD/history of CVA - For now, will continue Plavix and aspirin - Patient remains stable  History of PSVT/ PAF:  -Eliquis was reported to be discontinued and patient was started on Plavix. -at present stable  Essential  hypertension  - Continue metoprolol as tolerated - Continue on norvasc 5mg  with improved BP   Hyperlipidemia - Continue simvastatin as toelrated - remains stable  Suspected OSA - Pt witnessed to have periods of apnea while sleeping - Have ordered CPAP, however pt is not tolerating thus far - Strongly advise outpatient sleep study  DVT prophylaxis: Lovenox subQ Code Status: Full Family Communication: Pt in room, family not at bedside, had discussed with wife over phone Disposition Plan: Anticipate SNF, timing uncertain  Consultants:    Procedures:     Antimicrobials: Anti-infectives    Start     Dose/Rate Route Frequency Ordered Stop   12/11/16 1800  levofloxacin (LEVAQUIN) tablet 750 mg     750 mg Oral Daily 12/11/16 0900     12/10/16 2000  ciprofloxacin (CIPRO) tablet 500 mg  Status:  Discontinued     500 mg Oral 2 times daily 12/10/16 1502 12/11/16 0900   12/10/16 1500  cefTAZidime (FORTAZ) 2 g in dextrose 5 % 50 mL IVPB  Status:  Discontinued     2 g 100 mL/hr over 30 Minutes Intravenous Every 12 hours 12/10/16 1242 12/10/16 1502   12/07/16 1800  cefTAZidime (FORTAZ) 2 g in dextrose 5 % 50 mL IVPB  Status:  Discontinued     2 g 100 mL/hr over 30 Minutes Intravenous Every 8 hours 12/07/16 1740 12/10/16 1242   12/06/16 1000  terbinafine (LAMISIL) tablet 250 mg    Comments:  continous     250 mg Oral Daily 12/05/16 2229     12/06/16 0300  clindamycin (CLEOCIN) IVPB 600 mg  Status:  Discontinued     600 mg 100 mL/hr over 30 Minutes Intravenous Every 8 hours 12/05/16 2229 12/07/16 1731   12/05/16 1900  clindamycin (CLEOCIN) IVPB 600 mg     600 mg 100 mL/hr over 30 Minutes Intravenous  Once 12/05/16 1859 12/05/16 2010      Subjective: Without complaints  Objective: Vitals:   12/11/16 1436 12/11/16 2057 12/11/16 2116 12/12/16 0552  BP: 132/61 (!) 149/79  (!) 145/77  Pulse: 85 82  83  Resp: (!) 26 (!) 24  20  Temp: 98.2 F (36.8 C) 97.4 F (36.3 C)  98.3 F  (36.8 C)  TempSrc: Oral Oral  Oral  SpO2: 93% 93% 96% 95%  Weight:      Height:        Intake/Output Summary (Last 24 hours) at 12/12/16 0834 Last data filed at 12/12/16 0555  Gross per 24 hour  Intake                0 ml  Output              700 ml  Net             -700 ml   Filed Weights   12/05/16 1707 12/06/16 0000  Weight: 125.2 kg (276 lb) 132.7 kg (292 lb 8.8 oz)    Examination:  General exam: conversant, in nad, awake Respiratory system: normal chest rise, distant breath sounds, decreased inspiratory/expiratory ratio Cardiovascular system: regular rhythm, s1-2 Gastrointestinal system:obese, no masses, pos BS Central nervous system: no seizures, no tremors Extremities: no clubbing, perfused, healing LE B  shin wounds Skin: normal skin turgor, without pallor Psychiatry: affect normal// no auditory hallucinations   Data Reviewed: I have personally reviewed following labs and imaging studies  CBC:  Recent Labs Lab 12/05/16 1741 12/06/16 0539 12/07/16 0536 12/09/16 0528 12/10/16 0530  WBC 13.5* 12.4* 11.0* 13.5* 9.8  NEUTROABS 10.5*  --   --   --   --   HGB 11.4* 11.0* 10.9* 11.3* 11.6*  HCT 36.1* 35.2* 35.3* 37.2* 37.3*  MCV 68.8* 68.5* 68.8* 70.2* 69.6*  PLT 240 235 251 291 [id number]   Basic Metabolic Panel:  Recent Labs Lab 12/07/16 0536 12/08/16 0517 12/09/16 0528 12/10/16 0530 12/11/16 0518  NA 141 141 143 139 140  K 4.2 4.8 4.4 4.4 4.3  CL 105 105 104 102 100*  CO2 29 30 32 31 34*  GLUCOSE 136* 179* 117* 117* 122*  BUN 22* 22* 27* 28* 31*  CREATININE 1.24 1.16 1.19 1.16 1.24  CALCIUM 8.9 9.4 9.5 9.2 9.4   GFR: Estimated Creatinine Clearance: 62.8 mL/min (by C-G formula based on SCr of 1.24 mg/dL). Liver Function Tests:  Recent Labs Lab 12/09/16 0528  AST 20  ALT 21  ALKPHOS 68  BILITOT 0.4  PROT 6.7  ALBUMIN 3.0*   No results for input(s): LIPASE, AMYLASE in the last 168 hours. No results for input(s): AMMONIA in the last 168  hours. Coagulation Profile: No results for input(s): INR, PROTIME in the last 168 hours. Cardiac Enzymes: No results for input(s): CKTOTAL, CKMB, CKMBINDEX, TROPONINI in the last 168 hours. BNP (last 3 results) No results for input(s): PROBNP in the last 8760 hours. HbA1C: No results for input(s): HGBA1C in the last 72 hours. CBG:  Recent Labs Lab 12/11/16 1231 12/11/16 1803 12/11/16 2054 12/12/16 0734 12/12/16 0759  GLUCAP 198* 260* 208* 51* 71   Lipid Profile: No results for input(s): CHOL, HDL, LDLCALC, TRIG, CHOLHDL, LDLDIRECT in the last 72 hours. Thyroid Function Tests: No results for input(s): TSH, T4TOTAL, FREET4, T3FREE, THYROIDAB in the last 72 hours. Anemia Panel: No results for input(s): VITAMINB12, FOLATE, FERRITIN, TIBC, IRON, RETICCTPCT in the last 72 hours. Sepsis Labs:  Recent Labs Lab 12/05/16 1752 12/05/16 2156  LATICACIDVEN 1.24 1.36    Recent Results (from the past 240 hour(s))  Blood culture (routine x 2)     Status: None   Collection Time: 12/05/16  5:45 PM  Result Value Ref Range Status   Specimen Description BLOOD RIGHT ARM  Final   Special Requests IN PEDIATRIC BOTTLE 5CC  Final   Culture   Final    NO GROWTH 5 DAYS Performed at [facility], [place] [address], [place], [place] [address]    Report Status 12/10/2016 FINAL  Final  Blood culture (routine x 2)     Status: None   Collection Time: 12/05/16  5:45 PM  Result Value Ref Range Status   Specimen Description RIGHT ANTECUBITAL  Final   Special Requests BOTTLES DRAWN AEROBIC AND ANAEROBIC 5CC  Final   Culture   Final    NO GROWTH 5 DAYS Performed at [facility], [place] [address], [place], [place] [address]    Report Status 12/10/2016 FINAL  Final  Wound or Superficial Culture     Status: None   Collection Time: 12/05/16  7:20 PM  Result Value Ref Range Status   Specimen Description LEG RIGHT  Final   Special Requests NONE  Final   Gram Stain   Final    FEW  WBC PRESENT,  PREDOMINANTLY PMN NO ORGANISMS SEEN Performed at [facility] [address], [place], [place] [address]    Culture   Final    ABUNDANT PSEUDOMONAS AERUGINOSA FEW PROVIDENCIA RETTGERI    Report Status 12/10/2016 FINAL  Final   Organism ID, Bacteria PSEUDOMONAS AERUGINOSA  Final   Organism ID, Bacteria PROVIDENCIA RETTGERI  Final      Susceptibility   Pseudomonas aeruginosa - MIC*    CEFTAZIDIME 4 SENSITIVE Sensitive     CIPROFLOXACIN <=0.25 SENSITIVE Sensitive     GENTAMICIN <=1 SENSITIVE Sensitive     IMIPENEM 1 SENSITIVE Sensitive     PIP/TAZO 8 SENSITIVE Sensitive     CEFEPIME 4 SENSITIVE Sensitive     * ABUNDANT PSEUDOMONAS AERUGINOSA   Providencia rettgeri - MIC*    AMPICILLIN >=32 RESISTANT Resistant     CEFAZOLIN >=64 RESISTANT Resistant     CEFEPIME <=1 SENSITIVE Sensitive     CEFTAZIDIME <=1 SENSITIVE Sensitive     CEFTRIAXONE <=1 SENSITIVE Sensitive     CIPROFLOXACIN <=0.25 SENSITIVE Sensitive     GENTAMICIN 2 SENSITIVE Sensitive     IMIPENEM 2 SENSITIVE Sensitive     TRIMETH/SULFA <=20 SENSITIVE Sensitive     AMPICILLIN/SULBACTAM >=32 RESISTANT Resistant     PIP/TAZO <=4 SENSITIVE Sensitive     * FEW PROVIDENCIA RETTGERI     Radiology Studies: Ct Chest W Contrast  Result Date: 12/11/2016 CLINICAL DATA:  Shortness of breath and lower extremity edema EXAM: CT CHEST WITH CONTRAST TECHNIQUE: Multidetector CT imaging of the chest was performed during intravenous contrast administration. CONTRAST:  75 mL ISOVUE-300 IOPAMIDOL (ISOVUE-300) INJECTION 61% COMPARISON:  Chest radiograph chair 16 2018 FINDINGS: Cardiovascular: There is no demonstrable thoracic aortic aneurysm or dissection. There is moderate atherosclerotic calcification in the aorta. There are scattered foci of calcification in the proximal great vessels. There are multiple scattered foci of coronary artery calcification. Pericardium is not thickened. Mediastinum/Nodes: Thyroid appears unremarkable. There  is fairly extensive mediastinal fat diffusely. There is no evident thoracic adenopathy. There is moderate esophageal wall thickening in the midesophagus. Lungs/Pleura: There is patchy atelectasis in the posterior segment left lower lobe as well as in portions of the inferior lingula and left lower lobe. Similar atelectatic changes noted in the posterior right base. There is a small area of airspace consolidation in the posterior left base. There is no appreciable pleural effusion or pleural thickening. Upper Abdomen: There are small gallstones within the gallbladder. The visualized upper abdominal structures otherwise appear unremarkable. Musculoskeletal: There is degenerative change in the thoracic spine. There are no blastic or lytic bone lesions. IMPRESSION: Areas of patchy atelectasis, primarily on the left, with a small area of consolidation posterior left base. No evident adenopathy. Multiple foci of atherosclerotic calcification including multiple foci of coronary artery calcification. Several small gallstones noted in gallbladder. Thickening of the mid esophageal wall may be indicative of chronic reflux type esophagitis. Electronically Signed   By: Lowella Grip III M.D.   On: 12/11/2016 07:09    Scheduled Meds: . amitriptyline  25 mg Oral QHS  . amLODipine  5 mg Oral Daily  . aspirin EC  81 mg Oral Daily  . buPROPion  150 mg Oral Daily  . clopidogrel  75 mg Oral Q breakfast  . enoxaparin (LOVENOX) injection  60 mg Subcutaneous QHS  . fluticasone  1 spray Each Nare Daily  . furosemide  40 mg Intravenous Daily  . gabapentin  300 mg Oral TID  .  insulin aspart  0-15 Units Subcutaneous TID WC  . insulin aspart  0-5 Units Subcutaneous QHS  . insulin glargine  50 Units Subcutaneous BID  . ipratropium-albuterol  3 mL Nebulization BID  . levofloxacin  750 mg Oral Daily  . metoprolol tartrate  25 mg Oral BID  . pantoprazole  40 mg Oral Daily  . potassium chloride SA  20 mEq Oral Daily  .  predniSONE  60 mg Oral Q breakfast  . senna-docusate  1 tablet Oral Daily  . simvastatin  20 mg Oral Daily  . terazosin  4 mg Oral Daily  . terbinafine  250 mg Oral Daily   Continuous Infusions:   LOS: 6 days   Isidoro Santillana, Orpah Melter, MD [facility] Pager [phone]  If 7PM-7AM, please contact night-coverage [email or web address] Password [facility] 12/12/2016, 8:34 AM

## 2016-12-12 NOTE — Progress Notes (Signed)
*  Preliminary Results* Bilateral lower extremity venous duplex completed. Study was technically difficult due to patient body habitus, depth of vessels, and swelling. There is no obvious evidence of deep vein thrombosis involving visualized veins of bilateral lower extremities. There is no evidence of Baker's cyst bilaterally.  12/12/2016 3:00 PM Maudry Mayhew, BS, RVT, RDCS, RDMS

## 2016-12-13 DIAGNOSIS — L03119 Cellulitis of unspecified part of limb: Secondary | ICD-10-CM

## 2016-12-13 DIAGNOSIS — Z8673 Personal history of transient ischemic attack (TIA), and cerebral infarction without residual deficits: Secondary | ICD-10-CM

## 2016-12-13 DIAGNOSIS — I10 Essential (primary) hypertension: Secondary | ICD-10-CM

## 2016-12-13 DIAGNOSIS — I480 Paroxysmal atrial fibrillation: Secondary | ICD-10-CM

## 2016-12-13 DIAGNOSIS — J181 Lobar pneumonia, unspecified organism: Secondary | ICD-10-CM

## 2016-12-13 DIAGNOSIS — E1051 Type 1 diabetes mellitus with diabetic peripheral angiopathy without gangrene: Secondary | ICD-10-CM

## 2016-12-13 LAB — GLUCOSE, CAPILLARY
GLUCOSE-CAPILLARY: 66 mg/dL (ref 65–99)
GLUCOSE-CAPILLARY: 230 mg/dL — AB (ref 65–99)
GLUCOSE-CAPILLARY: 251 mg/dL — AB (ref 65–99)
Glucose-Capillary: 92 mg/dL (ref 65–99)
Glucose-Capillary: 194 mg/dL — ABNORMAL HIGH (ref 65–99)

## 2016-12-13 LAB — BASIC METABOLIC PANEL
Anion gap: 8 (ref 5–15)
BUN: 34 mg/dL — AB (ref 6–20)
CO2: 36 mmol/L — ABNORMAL HIGH (ref 22–32)
CREATININE: 1.3 mg/dL — AB (ref 0.61–1.24)
Calcium: 9.6 mg/dL (ref 8.9–10.3)
Chloride: 99 mmol/L — ABNORMAL LOW (ref 101–111)
GFR, EST AFRICAN AMERICAN: 57 mL/min — AB (ref >60)
GFR, EST NON AFRICAN AMERICAN: 49 mL/min — AB (ref >60)
Glucose, Bld: 73 mg/dL (ref 65–99)
Potassium: 5 mmol/L (ref 3.5–5.1)
SODIUM: 143 mmol/L (ref 135–145)

## 2016-12-13 MED ORDER — INSULIN GLARGINE 100 UNIT/ML ~~LOC~~ SOLN
40.0000 [IU] | Freq: Two times a day (BID) | SUBCUTANEOUS | Status: DC
  Administered 2016-12-13 – 2016-12-14 (×3): 40 [IU] via SUBCUTANEOUS
  Filled 2016-12-13 (×4): qty 0.4

## 2016-12-13 MED ORDER — INSULIN GLARGINE 100 UNIT/ML ~~LOC~~ SOLN: 40.0000 [IU] | Freq: Two times a day (BID) | SUBCUTANEOUS | Status: DC

## 2016-12-13 MED ORDER — PREDNISONE 20 MG PO TABS
40.0000 mg | ORAL_TABLET | Freq: Every day | ORAL | Status: DC
  Administered 2016-12-14 – 2016-12-15 (×2): 40 mg via ORAL
  Filled 2016-12-13 (×2): qty 2

## 2016-12-13 MED ORDER — IPRATROPIUM-ALBUTEROL 0.5-2.5 (3) MG/3ML IN SOLN
3.0000 mL | Freq: Four times a day (QID) | RESPIRATORY_TRACT | Status: DC
  Administered 2016-12-13 – 2016-12-15 (×9): 3 mL via RESPIRATORY_TRACT
  Filled 2016-12-13 (×9): qty 3

## 2016-12-13 MED ORDER — GUAIFENESIN ER 600 MG PO TB12
1200.0000 mg | ORAL_TABLET | Freq: Two times a day (BID) | ORAL | Status: DC
  Administered 2016-12-13 – 2016-12-15 (×5): 1200 mg via ORAL
  Filled 2016-12-13 (×5): qty 2

## 2016-12-13 MED ORDER — ACETYLCYSTEINE 20 % IN SOLN
4.0000 mL | Freq: Four times a day (QID) | RESPIRATORY_TRACT | Status: DC
  Administered 2016-12-13 – 2016-12-14 (×6): 4 mL via RESPIRATORY_TRACT
  Filled 2016-12-13 (×8): qty 4

## 2016-12-13 NOTE — Clinical Social Work Note (Signed)
Patient's dtr-in-law, [name] contacted MSW to share that she has contacted patient to discuss SNF option and that patient wished to return home with [facility] via [facility].  MSW explained that [facility] does NOT provide 24hr supervision and assistance and that family would have to pay privately for caregivers if needed.   Pt's dtr-in-law planning to contact family for assistance and plans to call MSW with updates.   MSW remains available as needed.   Glendon Axe, MSW [phone] 12/13/2016 1:21 PM

## 2016-12-13 NOTE — Care Management Note (Signed)
Case Management Note  Patient Details  Name: TILDON BAUER MRN: [id number] Date of Birth: May 03, 1931  Subjective/Objective:  Pt admitted with Cellulitis from home.                   Action/Plan: Spoke with pt's daughter in law concerning pt's discharge plan. Pt will discharge home with [facility].  Expected Discharge Date:   12/14/2016             Expected Discharge Plan:     In-House Referral:  Clinical Social Work  Discharge planning Services     Post Acute Care Choice:    Choice offered to:     DME Arranged:   (pt has lift chair and 4 wheel walker at home) DME Agency:  NA, [place]:  RN, PT, NA [place] Agency:  [facility]  Status of Service:  In process, will continue to follow  If discussed at Long Length of Stay Meetings, dates discussed:    Additional CommentsPurcell Mouton, RN 12/13/2016, 3:21 PM

## 2016-12-13 NOTE — Progress Notes (Signed)
PROGRESS NOTE    Jared Hall   [id number]  DOB: Mar 25, 1931  DOA: 12/05/2016 PCP: Henrine Screws, MD   Brief Narrative:  83 y.o.malewith medical history significant ofHTN, HLD, CVA, CHF last EF showed 55% in4/2016, DM type II, b/l lower extremity edema;who presents with worsening lower extremity swelling. Patient was sent to the ED 7days ago for reports of his right fifth digit turning black. He was recommended to increase Lasix to 40 mg twice daily. 4 days ago he saw his PCP who started him on Augmentin which she's been taking twice daily as prescribed. He reports that he noticed that his leg started to weep yesterday. His home health nurses evaluated him reported worsening erythema and drainage from the legs. Associated symptoms include worsening dyspnea with exertion, orthopnea, and weight gain. He denies having any fevers, chills, or chest pain.  Upon admission patient was evaluated and seen to be afebrile, heart rates up to 107, and all other vitals relatively within normal limits. Patient was started on clindamycin inthe ED . Blood and wound cultures obtained.  During this hospital course, patient was continued on abx with eventual improvement/resolution of LE cellulitis. Patient was noted to have increased work of breathing with CXR being unremarkable and chest CT found to be largely unremarkable. Patient has since been continued on scheduled lasix and duonebs. Pt was observed to have periods of apnea while sleeping, however has not tolerated trial of CPAP this admission.  Subjective: Cough and congestion.   Assessment & Plan:   Cellulitis of B LE with sepsis present on admission:  -Patient presents tachycardic, tachypneic, WBC of 13.5, and lower extremity redness and swelling. Cultures obtained in ED. Patient started on clindamycin and area of erythema was marked. - Wound cultures obtained, demonstrating abundant pseudomonas and providencia species - Wound care  consulted, appreciate input - Patient was continued on cipro - Given concerns of possible PNA, have continued on daily levaquin  Bilateral lower extremity swelling, likely secondary to chronic venous stasis, likely not related to CHF  - edema has improved with elevation and diuretics - 2d echo done, normal LVEF without significant stensis - Family had reported that pt was noncompliant with keeping LE elevated prior to admission - Patient continues to report post calf tenderness. LE dopplers negative for DVT  Acute hypoxic resp failure - CT chest personally reviewed. Possible L sided consolidation noted with atalectasis - Cipro changed to levaquin on 1/22 - continued on IV Lasix- weight down by 5 kg-  ECHO shows grade 1 dCHF - also on Prednisone - quite congested today and hypoxic with pulse ox in 80s on RA - add Mucomyst and chest start PT   Chronic kidney disease stage III:  - follow  Diabetes mellitus type 1 with hypoglycemia - Decreased home Lantus from 60 units three times daily to 40 BID  CAD/history of CVA - For now, will continue Plavix and aspirin - Patient remains stable  History of PSVT/ PAF:  -Eliquis was reported to be discontinued and patient was started on Plavix. -at present stable  Essential hypertension  - Continue metoprolol as tolerated - Continue on norvasc 5mg  with improved BP  Hyperlipidemia - Continue simvastatin as toelrated - remains stable  Suspected OSA - Pt witnessed to have periods of apnea while sleeping - Have ordered CPAP, however pt is not tolerating thus far - Strongly advise outpatient sleep study   DVT prophylaxis: Lovenox Code Status: Full code Family Communication:  Disposition Plan: home when stable  Consultants:    Procedures:    Antimicrobials:  Anti-infectives    Start     Dose/Rate Route Frequency Ordered Stop   12/11/16 1800  levofloxacin (LEVAQUIN) tablet 750 mg     750 mg Oral Daily 12/11/16 0900      12/10/16 2000  ciprofloxacin (CIPRO) tablet 500 mg  Status:  Discontinued     500 mg Oral 2 times daily 12/10/16 1502 12/11/16 0900   12/10/16 1500  cefTAZidime (FORTAZ) 2 g in dextrose 5 % 50 mL IVPB  Status:  Discontinued     2 g 100 mL/hr over 30 Minutes Intravenous Every 12 hours 12/10/16 1242 12/10/16 1502   12/07/16 1800  cefTAZidime (FORTAZ) 2 g in dextrose 5 % 50 mL IVPB  Status:  Discontinued     2 g 100 mL/hr over 30 Minutes Intravenous Every 8 hours 12/07/16 1740 12/10/16 1242   12/06/16 1000  terbinafine (LAMISIL) tablet 250 mg    Comments:  continous     250 mg Oral Daily 12/05/16 2229     12/06/16 0300  clindamycin (CLEOCIN) IVPB 600 mg  Status:  Discontinued     600 mg 100 mL/hr over 30 Minutes Intravenous Every 8 hours 12/05/16 2229 12/07/16 1731   12/05/16 1900  clindamycin (CLEOCIN) IVPB 600 mg     600 mg 100 mL/hr over 30 Minutes Intravenous  Once 12/05/16 1859 12/05/16 2010       Objective: Vitals:   12/13/16 1014 12/13/16 1201 12/13/16 1237 12/13/16 1322  BP: (!) 145/68  (!) 102/39 (!) 156/71  Pulse: 98  (!) 102 84  Resp:      Temp:    98.9 F (37.2 C)  TempSrc:    Oral  SpO2: 92% 90% 92% 90%  Weight:      Height:        Intake/Output Summary (Last 24 hours) at 12/13/16 1453 Last data filed at 12/13/16 1327  Gross per 24 hour  Intake              720 ml  Output             2000 ml  Net            -1280 ml   Filed Weights   12/05/16 1707 12/06/16 0000 12/13/16 0525  Weight: 125.2 kg (276 lb) 132.7 kg (292 lb 8.8 oz) 122 kg (268 lb 15.4 oz)    Examination: General exam: Appears comfortable  HEENT: PERRLA, oral mucosa moist, no sclera icterus or thrush Respiratory system: b/l rhochi Respiratory effort normal. Cardiovascular system: S1 & S2 heard, RRR.  No murmurs  Gastrointestinal system: Abdomen soft, non-tender, nondistended. Normal bowel sound. No organomegaly Central nervous system: Alert and oriented. No focal neurological  deficits. Extremities: No cyanosis, clubbing or edema Skin: No rashes or ulcers Psychiatry:  Mood & affect appropriate.     Data Reviewed: I have personally reviewed following labs and imaging studies  CBC:  Recent Labs Lab 12/07/16 0536 12/09/16 0528 12/10/16 0530  WBC 11.0* 13.5* 9.8  HGB 10.9* 11.3* 11.6*  HCT 35.3* 37.2* 37.3*  MCV 68.8* 70.2* 69.6*  PLT 251 291 [id number]   Basic Metabolic Panel:  Recent Labs Lab 12/09/16 0528 12/10/16 0530 12/11/16 0518 12/12/16 0800 12/13/16 0555  NA 143 139 140 141 143  K 4.4 4.4 4.3 4.5 5.0  CL 104 102 100* 98* 99*  CO2 32 31 34* 36* 36*  GLUCOSE 117* 117* 122* 67 73  BUN 27*  28* 31* 34* 34*  CREATININE 1.19 1.16 1.24 1.11 1.30*  CALCIUM 9.5 9.2 9.4 9.4 9.6   GFR: Estimated Creatinine Clearance: 57.2 mL/min (by C-G formula based on SCr of 1.3 mg/dL (H)). Liver Function Tests:  Recent Labs Lab 12/09/16 0528  AST 20  ALT 21  ALKPHOS 68  BILITOT 0.4  PROT 6.7  ALBUMIN 3.0*   No results for input(s): LIPASE, AMYLASE in the last 168 hours. No results for input(s): AMMONIA in the last 168 hours. Coagulation Profile: No results for input(s): INR, PROTIME in the last 168 hours. Cardiac Enzymes: No results for input(s): CKTOTAL, CKMB, CKMBINDEX, TROPONINI in the last 168 hours. BNP (last 3 results) No results for input(s): PROBNP in the last 8760 hours. HbA1C: No results for input(s): HGBA1C in the last 72 hours. CBG:  Recent Labs Lab 12/12/16 1637 12/12/16 2006 12/13/16 0747 12/13/16 0817 12/13/16 1126  GLUCAP 209* 170* 66 92 194*   Lipid Profile: No results for input(s): CHOL, HDL, LDLCALC, TRIG, CHOLHDL, LDLDIRECT in the last 72 hours. Thyroid Function Tests: No results for input(s): TSH, T4TOTAL, FREET4, T3FREE, THYROIDAB in the last 72 hours. Anemia Panel: No results for input(s): VITAMINB12, FOLATE, FERRITIN, TIBC, IRON, RETICCTPCT in the last 72 hours. Urine analysis:    Component Value Date/Time    COLORURINE YELLOW 12/07/2016 0640   APPEARANCEUR CLEAR 12/07/2016 0640   LABSPEC 1.018 12/07/2016 0640   PHURINE 6.0 12/07/2016 0640   GLUCOSEU 50 (A) 12/07/2016 0640   HGBUR NEGATIVE 12/07/2016 0640   BILIRUBINUR NEGATIVE 12/07/2016 0640   KETONESUR NEGATIVE 12/07/2016 0640   PROTEINUR NEGATIVE 12/07/2016 0640   UROBILINOGEN 1.0 12/06/2014 1414   NITRITE NEGATIVE 12/07/2016 0640   LEUKOCYTESUR NEGATIVE 12/07/2016 0640   Sepsis Labs: @LABRCNTIP (procalcitonin:4,lacticidven:4) ) Recent Results (from the past 240 hour(s))  Blood culture (routine x 2)     Status: None   Collection Time: 12/05/16  5:45 PM  Result Value Ref Range Status   Specimen Description BLOOD RIGHT ARM  Final   Special Requests IN PEDIATRIC BOTTLE 5CC  Final   Culture   Final    NO GROWTH 5 DAYS Performed at [facility], [place] [address], [place], [place] [address]    Report Status 12/10/2016 FINAL  Final  Blood culture (routine x 2)     Status: None   Collection Time: 12/05/16  5:45 PM  Result Value Ref Range Status   Specimen Description RIGHT ANTECUBITAL  Final   Special Requests BOTTLES DRAWN AEROBIC AND ANAEROBIC 5CC  Final   Culture   Final    NO GROWTH 5 DAYS Performed at [facility], [place] [address], [place], [place] [address]    Report Status 12/10/2016 FINAL  Final  Wound or Superficial Culture     Status: None   Collection Time: 12/05/16  7:20 PM  Result Value Ref Range Status   Specimen Description LEG RIGHT  Final   Special Requests NONE  Final   Gram Stain   Final    FEW WBC PRESENT, PREDOMINANTLY PMN NO ORGANISMS SEEN Performed at [facility], [place] [address] [place], [place] [address]    Culture   Final    ABUNDANT PSEUDOMONAS AERUGINOSA FEW PROVIDENCIA RETTGERI    Report Status 12/10/2016 FINAL  Final   Organism ID, Bacteria PSEUDOMONAS AERUGINOSA  Final   Organism ID, Bacteria PROVIDENCIA RETTGERI  Final      Susceptibility   Pseudomonas aeruginosa - MIC*  CEFTAZIDIME 4 SENSITIVE Sensitive     CIPROFLOXACIN <=0.25 SENSITIVE Sensitive     GENTAMICIN <=1 SENSITIVE Sensitive     IMIPENEM 1 SENSITIVE Sensitive     PIP/TAZO 8 SENSITIVE Sensitive     CEFEPIME 4 SENSITIVE Sensitive     * ABUNDANT PSEUDOMONAS AERUGINOSA   Providencia rettgeri - MIC*    AMPICILLIN >=32 RESISTANT Resistant     CEFAZOLIN >=64 RESISTANT Resistant     CEFEPIME <=1 SENSITIVE Sensitive     CEFTAZIDIME <=1 SENSITIVE Sensitive     CEFTRIAXONE <=1 SENSITIVE Sensitive     CIPROFLOXACIN <=0.25 SENSITIVE Sensitive     GENTAMICIN 2 SENSITIVE Sensitive     IMIPENEM 2 SENSITIVE Sensitive     TRIMETH/SULFA <=20 SENSITIVE Sensitive     AMPICILLIN/SULBACTAM >=32 RESISTANT Resistant     PIP/TAZO <=4 SENSITIVE Sensitive     * FEW PROVIDENCIA RETTGERI         Radiology Studies: No results found.    Scheduled Meds: . acetylcysteine  4 mL Nebulization QID  . amitriptyline  25 mg Oral QHS  . amLODipine  5 mg Oral Daily  . aspirin EC  81 mg Oral Daily  . buPROPion  150 mg Oral Daily  . clopidogrel  75 mg Oral Q breakfast  . enoxaparin (LOVENOX) injection  60 mg Subcutaneous QHS  . fluticasone  1 spray Each Nare Daily  . furosemide  40 mg Intravenous Daily  . gabapentin  300 mg Oral TID  . guaiFENesin  1,200 mg Oral BID  . insulin aspart  0-15 Units Subcutaneous TID WC  . insulin aspart  0-5 Units Subcutaneous QHS  . insulin glargine  40 Units Subcutaneous BID  . ipratropium-albuterol  3 mL Nebulization QID  . levofloxacin  750 mg Oral Daily  . metoprolol tartrate  25 mg Oral BID  . pantoprazole  40 mg Oral Daily  . potassium chloride SA  20 mEq Oral Daily  . predniSONE  60 mg Oral Q breakfast  . senna-docusate  1 tablet Oral Daily  . simvastatin  20 mg Oral Daily  . terazosin  4 mg Oral Daily  . terbinafine  250 mg Oral Daily   Continuous Infusions:   LOS: 7 days    Time spent in minutes: 47    [place], MD [facility] Pager: [email or web address] Password [facility] 12/13/2016, 2:53 PM

## 2016-12-13 NOTE — Clinical Social Work Note (Signed)
Patient has bed at [facility] (semi-pvt) once medically stable.   MSW has discussed plans with patient's dtr in law, [name] in regards to patient's decision to return home. Dtr-in-law, [name] plans to contact patient to discuss SNF placement as well.   MSW remains available as needed.   Glendon Axe, MSW [phone] 12/13/2016 12:35 PM

## 2016-12-13 NOTE — Progress Notes (Signed)
Assessed patients oxygen status off oxygen. Patient was 88% at rest on room air.  Oxygen reapplied @ 1 liter via nasal cannula

## 2016-12-13 NOTE — Progress Notes (Signed)
Hypoglycemic Event  CBG: 66  Treatment: 60grams carbohydrates , patient ate breakfast tray   Symptoms: none   Follow-up CBG: Time: 66    CBG Result: 92  Possible Reasons for Event:  No HS snack per patient, possible Lantus dose  needs adjusted   Comments/MD notified: will notify in morning round  rounds    Isaac Lacson, Lester Violet

## 2016-12-13 NOTE — Clinical Social Work Note (Signed)
Clinical Social Work Assessment  Patient Details  Name: Jared Hall MRN: [id number] Date of Birth: 11/20/1931  Date of referral:  12/12/2016               Reason for consult:  Facility Placement, Discharge Planning                Permission sought to share information with:  Family Supports, Customer service manager, Case Optician, dispensing granted to share information::  Yes, Verbal Permission Granted  Name::      Jared Hall )  Agency::   (SNF's )  Relationship::   (Dtr-in-law )  Contact Information:   [phone])  Housing/Transportation Living arrangements for the past 2 months:  [place] of Information:  Patient, Adult Children Patient Interpreter Needed:  None Criminal Activity/Legal Involvement Pertinent to Current Situation/Hospitalization:  No - Comment as needed Significant Relationships:  Adult Children Lives with:  Self Do you feel safe going back to the place where you live?  No Need for family participation in patient care:  Yes (Comment)  Care giving concerns: Patient admitted for lower extremity swelling however has expressed interest in returning home.    Social Worker assessment / plan:  1/23: MSW spoke with patient's dtr-in-law, Jared Rubin in reference to post-acute placement for SNF. Patient asleep and did not wake up for assessment. MSW introduced MSW role and SNF process. Patient's dtr-in-law reported in the past that patient received STR at [facility]. Pt's dtr-in-law further reported that patient will likely not want to dc to STR as recommended. FL-2 completed and faxed to [facility]. Facility notified to review referral.   1/24: MSW met with patient at bedside in regards to SNF placement. Patient stated he has been active with [facility] and prefers to return home. Patient acknowledged being at [facility] in the past however wishes to return home.     Employment status:  Retired Forensic scientist:   Medicare PT Recommendations:  [place] / Referral to community resources:  [place]  Patient/Family's Response to care:  Patient alert and oriented x4. Patient prefers to return home however dtr-in-law agreeable to SNF placement and will discuss options with patient. Dtr-in-law requesting private duty list, MSW to notify RNCM. Patient's dtr-in-law supportive in pt's care and SNF plan. Pt's dtr appreciated sw intervention.   Patient/Family's Understanding of and Emotional Response to Diagnosis, Current Treatment, and Prognosis:  Patient declining SNF placement. Pt's dtr knowledgeable of medical interventions and recommendations for SNF.   Emotional Assessment Appearance:  Appears stated age Attitude/Demeanor/Rapport:   (Polite ) Affect (typically observed):  Accepting, Appropriate, Pleasant Orientation:  Oriented to Situation, Oriented to  Time, Oriented to Place, Oriented to Self Alcohol / Substance use:  Not Applicable Psych involvement (Current and /or in the community):  No (Comment)  Discharge Needs  Concerns to be addressed:  Care Coordination, Discharge Planning Concerns Readmission within the last 30 days:  No Current discharge risk:  Dependent with Mobility Barriers to Discharge:  Continued Medical Work up   [name] 12/13/2016, 12:47 PM

## 2016-12-13 NOTE — Clinical Social Work Placement (Signed)
   CLINICAL SOCIAL WORK PLACEMENT  NOTE  Date:  12/13/2016  Patient Details  Name: Jared Hall MRN: [id number] Date of Birth: [birth year]  Clinical Social Work is seeking post-discharge placement for this patient at the [place] level of care (*CSW will initial, date and re-position this form in  chart as items are completed):  Yes   Patient/family provided with [place] Work Department's list of facilities offering this level of care within the geographic area requested by the patient (or if unable, by the patient's family).  Yes   Patient/family informed of their freedom to choose among providers that offer the needed level of care, that participate in Medicare, Medicaid or managed care program needed by the patient, have an available bed and are willing to accept the patient.  Yes   Patient/family informed of [facility]'s ownership interest in [facility] and [facility], as well as of the fact that they are under no obligation to receive care at these facilities.  PASRR submitted to EDS on 12/12/16     PASRR number received on       Existing PASRR number confirmed on 12/12/16     FL2 transmitted to all facilities in geographic area requested by pt/family on 12/12/16     FL2 transmitted to all facilities within larger geographic area on       Patient informed that his/her managed care company has contracts with or will negotiate with certain facilities, including the following:        Yes   Patient/family informed of bed offers received.  Patient chooses bed at  [facility] )     Physician recommends and patient chooses bed at      Patient to be transferred to  [facility] [place] [facility] ) on  .  Patient to be transferred to facility by       Patient family notified on   of transfer.  Name of family member notified:        PHYSICIAN Please sign FL2     Additional Comment:     _______________________________________________ [name] 12/13/2016, 11:42 AM

## 2016-12-13 NOTE — Clinical Social Work Note (Signed)
MSW received phone call from patient's dtr-in-law, [name] stating family and church family are able to provide 24/7 care for patient at home. Patient requesting [facility] via [place].   RNCM notified. In the event patient is agreeable to SNF- [facility] (semi-pvt room) is available.   Medical Social Worker will sign off for now as social work intervention is no longer needed. Please consult [place] again if new need arises.  Glendon Axe, MSW [phone] 12/13/2016 3:15 PM

## 2016-12-13 NOTE — Progress Notes (Signed)
Occupational Therapy Treatment Patient Details Name: PHUOC PELLICANO MRN: [id number] DOB: 1931/07/17 Today's Date: 12/13/2016    History of present illness Pt is an 83 year old male admitted for cellulitis.  PMH significant for CVA, CHF, DM type 2, bil LE edema   OT comments  Pt making gains  Follow Up Recommendations  SNF    Equipment Recommendations  3 in 1 bedside commode    Recommendations for Other Services      Precautions / Restrictions Precautions Precautions: Fall Precaution Comments: HOH; monitor vitals Restrictions Weight Bearing Restrictions: No       Mobility Bed Mobility               General bed mobility comments: oob when OT arrived  Transfers Overall transfer level: Needs assistance Equipment used: Rolling walker (2 wheeled) Transfers: Sit to/from Stand Sit to Stand: Min assist         General transfer comment: steadying assistance    Balance                                   ADL                                         General ADL Comments: ambulated to bathroom with min guard assist.  Pt did not need to use bathroom and didn't want to sit on commode at this time.  He stood briefly at sink but did not want to perform any grooming.  Pt does not have teeth:  he usually rinses mouth with biotene. Reviewed energy conservation and taking rest breaks as needed. Pt reports he has been participating in [place] During Therapy: [facility] for tasks assessed/performed Overall Cognitive Status: Within Functional Limits for tasks assessed                  General Comments: HOH contributes to misunderstanding instructions during session    Extremity/Trunk Assessment               Exercises     Shoulder Instructions       General Comments      Pertinent Vitals/ Pain       Pain Assessment: No/denies pain Faces  Pain Scale: Hurts a little bit Pain Location: bil LEs Pain Descriptors / Indicators: Sore Pain Intervention(s): Limited activity within patient's tolerance;Monitored during session  Home Living                                          Prior Functioning/Environment              Frequency           Progress Toward Goals  OT Goals(current goals can now be found in the care plan section)  Progress towards OT goals: Progressing toward goals     Plan      Co-evaluation                 End of Session  Activity Tolerance Patient tolerated treatment well   Patient Left in bed;with call bell/phone within reach;with chair alarm set   Nurse Communication          Time: [id number] OT Time Calculation (min): 10 min  Charges: OT General Charges $OT Visit: 1 Procedure OT Treatments $Therapeutic Activity: 8-22 mins  Renato Spellman 12/13/2016, 1:56 PM Lesle Chris, OTR/L [phone] 12/13/2016

## 2016-12-13 NOTE — Progress Notes (Signed)
Physical Therapy Treatment Patient Details Name: Jared Hall MRN: [id number] DOB: 1931/01/19 Today's Date: 12/13/2016    History of Present Illness Pt is an 83 year old male admitted for cellulitis.  PMH significant for CVA, CHF, DM type 2, bil LE edema    PT Comments    Pt OOB in recliner on 2 lts with sats at 96%  Removed O2 for trial to amb to bathroom.  Unsteady gait and impulsive hitting the doorway and pushing walker too far to front.   Assisted in bathroom when pt began to c/o dizziness.  RA was good at 92% and HR 87.  BP was 102/39 standing.  Recliner brought to pt.  BP prior to activity was 136/69 sitting in recliner.  Reported to RN and entered in [place]. Pt lives home alone and is very HOH.  Has one hearing aide but pt reports it's broken. Pt will need ST Rehab at SNF before safely returing home.    Follow Up Recommendations  SNF     Equipment Recommendations  None recommended by PT    Recommendations for Other Services       Precautions / Restrictions Precautions Precautions: Fall Precaution Comments: HOH; monitor vitals Restrictions Weight Bearing Restrictions: No    Mobility  Bed Mobility               General bed mobility comments: oob in recliner  Transfers Overall transfer level: Needs assistance Equipment used: Rolling walker (2 wheeled) Transfers: Sit to/from Stand Sit to Stand: Min assist;Mod assist (depending on height of seat)         General transfer comment: Assist to rise, stabilize, control descent. VCs safety, hand placement.   Pt admits to having a lift chair at home  Ambulation/Gait Ambulation/Gait assistance: Min assist Ambulation Distance (Feet): 12 Feet Assistive device: Rolling walker (2 wheeled) Gait Pattern/deviations: Step-through pattern;Decreased stride length Gait velocity: decreased    General Gait Details: amb to bathroom pt started of c/o alt dizziness.  RA was good at 92%.  NO dyspnea.  Symptoms relieved with seated  rest break.  Amb in hallway a limited distance and pt c/o again.  standing BP was 102/39 with RA 92% and HR 102.  Recliner brought to pt.     Stairs            Wheelchair Mobility    Modified Rankin (Stroke Patients Only)       Balance                                    Cognition Arousal/Alertness: Awake/alert Behavior During Therapy: WFL for tasks assessed/performed Overall Cognitive Status: Within Functional Limits for tasks assessed                 General Comments: HOH contributes to misunderstanding instructions during session    Exercises      General Comments        Pertinent Vitals/Pain Pain Assessment: No/denies pain    Home Living                      Prior Function            PT Goals (current goals can now be found in the care plan section) Progress towards PT goals: Progressing toward goals    Frequency    Min 3X/week      PT Plan Current plan remains  appropriate    Co-evaluation             End of Session Equipment Utilized During Treatment: Gait belt Activity Tolerance: Other (comment) (limited by Hypotensive) Patient left: in chair     Time: 1206-1232 PT Time Calculation (min) (ACUTE ONLY): 26 min  Charges:  $Gait Training: 8-22 mins $Therapeutic Activity: 8-22 mins                    G Codes:      Rica Koyanagi  PTA [facility]  Acute  Rehab Pager      [phone]

## 2016-12-14 DIAGNOSIS — N183 Chronic kidney disease, stage 3 (moderate): Secondary | ICD-10-CM

## 2016-12-14 LAB — CBC
HCT: 42.6 % (ref 39.0–52.0)
HEMOGLOBIN: 13.3 g/dL (ref 13.0–17.0)
MCH: 21.6 pg — ABNORMAL LOW (ref 26.0–34.0)
MCHC: 31.2 g/dL (ref 30.0–36.0)
MCV: 69.3 fL — ABNORMAL LOW (ref 78.0–100.0)
PLATELETS: 302 10*3/uL (ref 150–400)
RBC: 6.15 MIL/uL — AB (ref 4.22–5.81)
RDW: 14.9 % (ref 11.5–15.5)
WBC: 9.2 10*3/uL (ref 4.0–10.5)

## 2016-12-14 LAB — GLUCOSE, CAPILLARY
GLUCOSE-CAPILLARY: 63 mg/dL — AB (ref 65–99)
GLUCOSE-CAPILLARY: 221 mg/dL — AB (ref 65–99)
GLUCOSE-CAPILLARY: 219 mg/dL — AB (ref 65–99)
Glucose-Capillary: 267 mg/dL — ABNORMAL HIGH (ref 65–99)

## 2016-12-14 MED ORDER — INSULIN GLARGINE 100 UNIT/ML ~~LOC~~ SOLN
30.0000 [IU] | Freq: Every day | SUBCUTANEOUS | Status: DC
  Administered 2016-12-14: 30 [IU] via SUBCUTANEOUS
  Filled 2016-12-14 (×2): qty 0.3

## 2016-12-14 MED ORDER — INSULIN GLARGINE 100 UNIT/ML ~~LOC~~ SOLN
40.0000 [IU] | Freq: Every day | SUBCUTANEOUS | Status: DC
  Administered 2016-12-15: 40 [IU] via SUBCUTANEOUS
  Filled 2016-12-14: qty 0.4

## 2016-12-14 NOTE — Plan of Care (Signed)
Problem: Safety: Goal: Ability to remain free from injury will improve Outcome: Completed/Met Date Met: 12/14/16 Bed alarm set. Discussed safety prevention with pt. Pt verbalized undrstanding

## 2016-12-14 NOTE — Progress Notes (Signed)
Patient refused dressing change to BLE this afternoon. Pt made aware that dressings are to be changed each shift. Pt requested to rest. Will follow up.

## 2016-12-14 NOTE — Progress Notes (Signed)
PROGRESS NOTE    Jared Hall   [id number]  DOB: 08-05-1931  DOA: 12/05/2016 PCP: Henrine Screws, MD   Brief Narrative:  83 y.o.malewith medical history significant ofHTN, HLD, CVA, CHF last EF showed 55% in4/2016, DM type II, b/l lower extremity edema;who presents with worsening lower extremity swelling. Patient was sent to the ED 7days ago for reports of his right fifth digit turning black. He was recommended to increase Lasix to 40 mg twice daily. 4 days ago he saw his PCP who started him on Augmentin which she's been taking twice daily as prescribed. He reports that he noticed that his leg started to weep yesterday. His home health nurses evaluated him reported worsening erythema and drainage from the legs. Associated symptoms include worsening dyspnea with exertion, orthopnea, and weight gain. He denies having any fevers, chills, or chest pain.  Upon admission patient was evaluated and seen to be afebrile, heart rates up to 107, and all other vitals relatively within normal limits. Patient was started on clindamycin inthe ED . Blood and wound cultures obtained.  During this hospital course, patient was continued on abx with eventual improvement/resolution of LE cellulitis. Patient was noted to have increased work of breathing with CXR being unremarkable and chest CT found to be largely unremarkable. Patient has since been continued on scheduled lasix and duonebs. Pt was observed to have periods of apnea while sleeping, however has not tolerated trial of CPAP this admission.  Subjective: Cough and congestion continues.    Assessment & Plan:   Cellulitis of B LE with sepsis present on admission:  -Patient presents tachycardic, tachypneic, WBC of 13.5, and lower extremity redness and swelling. Cultures obtained in ED. Patient started on clindamycin and area of erythema was marked. - Wound cultures obtained, demonstrating abundant pseudomonas and providencia species - Wound  care consulted, appreciate input - Cipro changed to Levaquin due to pneumonia  Bilateral lower extremity swelling, likely secondary to chronic venous stasis, likely not related to CHF  - edema has improved with elevation and diuretics - 2d echo done, normal LVEF without significant stensis - Family had reported that pt was noncompliant with keeping LE elevated prior to admission - Patient continues to report post calf tenderness. LE dopplers negative for DVT  Acute hypoxic resp failure - CT chest personally reviewed. Possible L sided consolidation noted with atalectasis - Cipro changed to levaquin on 1/22 - continued on IV Lasix- weight down by 5 kg-  ECHO shows grade 1 dCHF - also on Prednisone - quite congested today and hypoxic with pulse ox in 80s on RA - add Mucomyst and chest start PT   Chronic kidney disease stage III:  - follow  Diabetes mellitus type 1 with hypoglycemia - Decreased home Lantus from 60 units three times daily to 40 BID- hypoglycemic again this AM- will drop evening dose to 30 U.   CAD/history of CVA - For now, will continue Plavix and aspirin - Patient remains stable  History of PSVT/ PAF:  -Eliquis was reported to be discontinued and patient was started on Plavix. -at present stable  Essential hypertension  - Continue metoprolol as tolerated - Continue on norvasc 5mg  with improved BP  Hyperlipidemia - Continue simvastatin as toelrated - remains stable  Suspected OSA - Pt witnessed to have periods of apnea while sleeping - Have ordered CPAP, however pt is not tolerating thus far - Strongly advise outpatient sleep study   DVT prophylaxis: Lovenox Code Status: Full code Family Communication:  Disposition Plan: home when stable Consultants:    Procedures:    Antimicrobials:  Anti-infectives    Start     Dose/Rate Route Frequency Ordered Stop   12/11/16 1800  levofloxacin (LEVAQUIN) tablet 750 mg     750 mg Oral Daily 12/11/16  0900     12/10/16 2000  ciprofloxacin (CIPRO) tablet 500 mg  Status:  Discontinued     500 mg Oral 2 times daily 12/10/16 1502 12/11/16 0900   12/10/16 1500  cefTAZidime (FORTAZ) 2 g in dextrose 5 % 50 mL IVPB  Status:  Discontinued     2 g 100 mL/hr over 30 Minutes Intravenous Every 12 hours 12/10/16 1242 12/10/16 1502   12/07/16 1800  cefTAZidime (FORTAZ) 2 g in dextrose 5 % 50 mL IVPB  Status:  Discontinued     2 g 100 mL/hr over 30 Minutes Intravenous Every 8 hours 12/07/16 1740 12/10/16 1242   12/06/16 1000  terbinafine (LAMISIL) tablet 250 mg    Comments:  continous     250 mg Oral Daily 12/05/16 2229     12/06/16 0300  clindamycin (CLEOCIN) IVPB 600 mg  Status:  Discontinued     600 mg 100 mL/hr over 30 Minutes Intravenous Every 8 hours 12/05/16 2229 12/07/16 1731   12/05/16 1900  clindamycin (CLEOCIN) IVPB 600 mg     600 mg 100 mL/hr over 30 Minutes Intravenous  Once 12/05/16 1859 12/05/16 2010       Objective: Vitals:   12/13/16 2245 12/14/16 0634 12/14/16 0849 12/14/16 1100  BP:  (!) 187/85    Pulse:  84  93  Resp:  18    Temp:  97.8 F (36.6 C)    TempSrc:  Oral    SpO2: 94% 97% 95%   Weight:  119.4 kg (263 lb 3.7 oz)    Height:        Intake/Output Summary (Last 24 hours) at 12/14/16 1424 Last data filed at 12/14/16 0500  Gross per 24 hour  Intake              240 ml  Output              700 ml  Net             -460 ml   Filed Weights   12/06/16 0000 12/13/16 0525 12/14/16 0634  Weight: 132.7 kg (292 lb 8.8 oz) 122 kg (268 lb 15.4 oz) 119.4 kg (263 lb 3.7 oz)    Examination: General exam: Appears comfortable  HEENT: PERRLA, oral mucosa moist, no sclera icterus or thrush Respiratory system: b/l rhochi Respiratory effort normal. Cardiovascular system: S1 & S2 heard, RRR.  No murmurs  Gastrointestinal system: Abdomen soft, non-tender, nondistended. Normal bowel sound. No organomegaly Central nervous system: Alert and oriented. No focal neurological  deficits. Extremities: No cyanosis, clubbing or edema Skin: No rashes or ulcers Psychiatry:  Mood & affect appropriate.     Data Reviewed: I have personally reviewed following labs and imaging studies  CBC:  Recent Labs Lab 12/09/16 0528 12/10/16 0530 12/14/16 0528  WBC 13.5* 9.8 9.2  HGB 11.3* 11.6* 13.3  HCT 37.2* 37.3* 42.6  MCV 70.2* 69.6* 69.3*  PLT 291 287 [id number]   Basic Metabolic Panel:  Recent Labs Lab 12/09/16 0528 12/10/16 0530 12/11/16 0518 12/12/16 0800 12/13/16 0555  NA 143 139 140 141 143  K 4.4 4.4 4.3 4.5 5.0  CL 104 102 100* 98* 99*  CO2 32 31 34* 36*  36*  GLUCOSE 117* 117* 122* 67 73  BUN 27* 28* 31* 34* 34*  CREATININE 1.19 1.16 1.24 1.11 1.30*  CALCIUM 9.5 9.2 9.4 9.4 9.6   GFR: Estimated Creatinine Clearance: 56.6 mL/min (by C-G formula based on SCr of 1.3 mg/dL (H)). Liver Function Tests:  Recent Labs Lab 12/09/16 0528  AST 20  ALT 21  ALKPHOS 68  BILITOT 0.4  PROT 6.7  ALBUMIN 3.0*   No results for input(s): LIPASE, AMYLASE in the last 168 hours. No results for input(s): AMMONIA in the last 168 hours. Coagulation Profile: No results for input(s): INR, PROTIME in the last 168 hours. Cardiac Enzymes: No results for input(s): CKTOTAL, CKMB, CKMBINDEX, TROPONINI in the last 168 hours. BNP (last 3 results) No results for input(s): PROBNP in the last 8760 hours. HbA1C: No results for input(s): HGBA1C in the last 72 hours. CBG:  Recent Labs Lab 12/13/16 1126 12/13/16 1648 12/13/16 2224 12/14/16 0741 12/14/16 1157  GLUCAP 194* 230* 251* 63* 221*   Lipid Profile: No results for input(s): CHOL, HDL, LDLCALC, TRIG, CHOLHDL, LDLDIRECT in the last 72 hours. Thyroid Function Tests: No results for input(s): TSH, T4TOTAL, FREET4, T3FREE, THYROIDAB in the last 72 hours. Anemia Panel: No results for input(s): VITAMINB12, FOLATE, FERRITIN, TIBC, IRON, RETICCTPCT in the last 72 hours. Urine analysis:    Component Value Date/Time    COLORURINE YELLOW 12/07/2016 0640   APPEARANCEUR CLEAR 12/07/2016 0640   LABSPEC 1.018 12/07/2016 0640   PHURINE 6.0 12/07/2016 0640   GLUCOSEU 50 (A) 12/07/2016 0640   HGBUR NEGATIVE 12/07/2016 0640   BILIRUBINUR NEGATIVE 12/07/2016 0640   KETONESUR NEGATIVE 12/07/2016 0640   PROTEINUR NEGATIVE 12/07/2016 0640   UROBILINOGEN 1.0 12/06/2014 1414   NITRITE NEGATIVE 12/07/2016 0640   LEUKOCYTESUR NEGATIVE 12/07/2016 0640   Sepsis Labs: @LABRCNTIP (procalcitonin:4,lacticidven:4) ) Recent Results (from the past 240 hour(s))  Blood culture (routine x 2)     Status: None   Collection Time: 12/05/16  5:45 PM  Result Value Ref Range Status   Specimen Description BLOOD RIGHT ARM  Final   Special Requests IN PEDIATRIC BOTTLE 5CC  Final   Culture   Final    NO GROWTH 5 DAYS Performed at [facility], [place] [address], [place], [place] [address]    Report Status 12/10/2016 FINAL  Final  Blood culture (routine x 2)     Status: None   Collection Time: 12/05/16  5:45 PM  Result Value Ref Range Status   Specimen Description RIGHT ANTECUBITAL  Final   Special Requests BOTTLES DRAWN AEROBIC AND ANAEROBIC 5CC  Final   Culture   Final    NO GROWTH 5 DAYS Performed at [facility], [place] [address] [place], [place] [address]    Report Status 12/10/2016 FINAL  Final  Wound or Superficial Culture     Status: None   Collection Time: 12/05/16  7:20 PM  Result Value Ref Range Status   Specimen Description LEG RIGHT  Final   Special Requests NONE  Final   Gram Stain   Final    FEW WBC PRESENT, PREDOMINANTLY PMN NO ORGANISMS SEEN Performed at [facility], [place] [address] [place],  [address]    Culture   Final    ABUNDANT PSEUDOMONAS AERUGINOSA FEW PROVIDENCIA RETTGERI    Report Status 12/10/2016 FINAL  Final   Organism ID, Bacteria PSEUDOMONAS AERUGINOSA  Final   Organism ID, Bacteria PROVIDENCIA RETTGERI  Final  Susceptibility   Pseudomonas aeruginosa - MIC*     CEFTAZIDIME 4 SENSITIVE Sensitive     CIPROFLOXACIN <=0.25 SENSITIVE Sensitive     GENTAMICIN <=1 SENSITIVE Sensitive     IMIPENEM 1 SENSITIVE Sensitive     PIP/TAZO 8 SENSITIVE Sensitive     CEFEPIME 4 SENSITIVE Sensitive     * ABUNDANT PSEUDOMONAS AERUGINOSA   Providencia rettgeri - MIC*    AMPICILLIN >=32 RESISTANT Resistant     CEFAZOLIN >=64 RESISTANT Resistant     CEFEPIME <=1 SENSITIVE Sensitive     CEFTAZIDIME <=1 SENSITIVE Sensitive     CEFTRIAXONE <=1 SENSITIVE Sensitive     CIPROFLOXACIN <=0.25 SENSITIVE Sensitive     GENTAMICIN 2 SENSITIVE Sensitive     IMIPENEM 2 SENSITIVE Sensitive     TRIMETH/SULFA <=20 SENSITIVE Sensitive     AMPICILLIN/SULBACTAM >=32 RESISTANT Resistant     PIP/TAZO <=4 SENSITIVE Sensitive     * FEW PROVIDENCIA RETTGERI         Radiology Studies: No results found.    Scheduled Meds: . acetylcysteine  4 mL Nebulization QID  . amitriptyline  25 mg Oral QHS  . amLODipine  5 mg Oral Daily  . aspirin EC  81 mg Oral Daily  . buPROPion  150 mg Oral Daily  . clopidogrel  75 mg Oral Q breakfast  . enoxaparin (LOVENOX) injection  60 mg Subcutaneous QHS  . fluticasone  1 spray Each Nare Daily  . furosemide  40 mg Intravenous Daily  . gabapentin  300 mg Oral TID  . guaiFENesin  1,200 mg Oral BID  . insulin aspart  0-15 Units Subcutaneous TID WC  . insulin aspart  0-5 Units Subcutaneous QHS  . insulin glargine  40 Units Subcutaneous BID  . ipratropium-albuterol  3 mL Nebulization QID  . levofloxacin  750 mg Oral Daily  . metoprolol tartrate  25 mg Oral BID  . pantoprazole  40 mg Oral Daily  . potassium chloride SA  20 mEq Oral Daily  . predniSONE  40 mg Oral Q breakfast  . senna-docusate  1 tablet Oral Daily  . simvastatin  20 mg Oral Daily  . terazosin  4 mg Oral Daily  . terbinafine  250 mg Oral Daily   Continuous Infusions:   LOS: 8 days    Time spent in minutes: 60    [place], MD [facility] Pager: [email or web address] Password [facility] 12/14/2016, 2:24 PM

## 2016-12-14 NOTE — Procedures (Signed)
Patient continues to refuse nocturnal CPAP.  

## 2016-12-15 ENCOUNTER — Other Ambulatory Visit: Payer: Self-pay | Admitting: *Deleted

## 2016-12-15 ENCOUNTER — Encounter: Payer: Self-pay | Admitting: *Deleted

## 2016-12-15 DIAGNOSIS — I872 Venous insufficiency (chronic) (peripheral): Secondary | ICD-10-CM

## 2016-12-15 DIAGNOSIS — H9190 Unspecified hearing loss, unspecified ear: Secondary | ICD-10-CM

## 2016-12-15 DIAGNOSIS — J9601 Acute respiratory failure with hypoxia: Secondary | ICD-10-CM

## 2016-12-15 DIAGNOSIS — E1142 Type 2 diabetes mellitus with diabetic polyneuropathy: Secondary | ICD-10-CM

## 2016-12-15 DIAGNOSIS — J9600 Acute respiratory failure, unspecified whether with hypoxia or hypercapnia: Secondary | ICD-10-CM

## 2016-12-15 LAB — CBC
HEMATOCRIT: 44.9 % (ref 39.0–52.0)
HEMOGLOBIN: 13.3 g/dL (ref 13.0–17.0)
MCH: 21 pg — ABNORMAL LOW (ref 26.0–34.0)
MCHC: 29.6 g/dL — AB (ref 30.0–36.0)
MCV: 70.9 fL — ABNORMAL LOW (ref 78.0–100.0)
Platelets: 288 10*3/uL (ref 150–400)
RBC: 6.33 MIL/uL — ABNORMAL HIGH (ref 4.22–5.81)
RDW: 14.8 % (ref 11.5–15.5)
WBC: 12.9 10*3/uL — AB (ref 4.0–10.5)

## 2016-12-15 LAB — BASIC METABOLIC PANEL
ANION GAP: 5 (ref 5–15)
BUN: 40 mg/dL — ABNORMAL HIGH (ref 6–20)
CO2: 40 mmol/L — ABNORMAL HIGH (ref 22–32)
Calcium: 9.6 mg/dL (ref 8.9–10.3)
Chloride: 99 mmol/L — ABNORMAL LOW (ref 101–111)
Creatinine, Ser: 1.41 mg/dL — ABNORMAL HIGH (ref 0.61–1.24)
GFR calc Af Amer: 52 mL/min — ABNORMAL LOW (ref >60)
GFR, EST NON AFRICAN AMERICAN: 44 mL/min — AB (ref >60)
GLUCOSE: 111 mg/dL — AB (ref 65–99)
POTASSIUM: 5.2 mmol/L — AB (ref 3.5–5.1)
Sodium: 144 mmol/L (ref 135–145)

## 2016-12-15 LAB — GLUCOSE, CAPILLARY
GLUCOSE-CAPILLARY: 154 mg/dL — AB (ref 65–99)
Glucose-Capillary: 105 mg/dL — ABNORMAL HIGH (ref 65–99)

## 2016-12-15 MED ORDER — LEVOFLOXACIN 750 MG PO TABS: 750.0000 mg | ORAL_TABLET | Freq: Every day | ORAL | 0 refills | Status: DC

## 2016-12-15 MED ORDER — GUAIFENESIN ER 600 MG PO TB12: 1200.0000 mg | ORAL_TABLET | Freq: Two times a day (BID) | ORAL | 0 refills | Status: DC | PRN

## 2016-12-15 MED ORDER — INSULIN GLARGINE 100 UNIT/ML ~~LOC~~ SOLN: 40.0000 [IU] | Freq: Two times a day (BID) | SUBCUTANEOUS | 11 refills | Status: DC

## 2016-12-15 NOTE — Patient Outreach (Signed)
[facility]) Care Management [place] Telephone Outreach x 2, Transition of Care day 1 12/15/2016  ARLIN VORWALD 1930-12-07 [id number]  Unsuccessful telephone outreach to [name], daughter-in-law/ caregiver (on [facility] CM written consent), of Jaxiel K[place], 83 y/o male referred to [place] from [facility] telephonic RN CM, who received initial high risk referral from MD office. Patient has history including, but not limited to CHF, DM, HTN, HLD, CVA, CKD, paroxysmal AF. Patient has had recent hospital visit January 16-26, 2018 for bilateral LE cellulitis with sepsis, ARF with hypoxia, and pneumonia.  Patient was discharged home today after refusing SNF placement for rehabilitation with (new) home O2 therapy and home health [facility]) services through [facility].    [name] had called me this afternoon and left me a voice mail message requesting call back about concerns she had with patient's discharge from hospital earlier today.  I returned [name]'s call within the hour, HIPAA compliant voice mail message left for patient, requesting return call back.  [name] returned my phone call within minutes of leaving my voicemail message; HIPAA/ identity verified today by phone with [name].   [name] shared several concerns with me during our 25 minute phone call regarding Mr. Brackman' discharge from the hospital earlier today:  1) Lasix was discontinued:  I verified through review of EMR that discharge instructions included to stop lasix; apparently, during hospitalization there were concerns that patient had negative fluid balance/ dehydration.  However, this evening, [name] states that patient has a "severely congested cough with rattling."  [name] confirmed that she had contacted patient's PCP after hospital discharge about this, and was advised by PCP to contact [facility] as soon as possible to arrange expedited home visit from [facility] RN.  [name] stated that [facility] RN would be visiting patient  tomorrow, and that she [name]) would be present for [facility] visit.  [name] stated that "he was still coughing and sounded very congested" when she went to pick him up from the hospital for discharge home.  2) Patient is now on home O2:  [name] states that patient is wearing O2, but continues to voice concern about patient's "congestion."  I advised [name] that someone should physically stay with patient until [facility] services arrive tomorrow, and possibly beyond, depending on patient progress post-hospital discharge.  [name] confirmed that she has hired sitters to be with patient "at all times" for "at least 4 days."  Discussed use of home O2 with [name], and advised her to be aware of additional fall risk from O2 tubing, as home O2 is new to patient, he uses rolling walker at all times due to imbalance issues, and has been instructed to stop lasix therapy.  [name] confirmed that patient has all other prescribed medications at his home, including antibiotic prescribed at time of hospital discharge.  We discussed reasons [name] should call EMS emergently should issues and concerns arise over weekend, and I advised [name] to contact PCP office first thing on Monday morning to make hospital follow up appointment, and [name] voiced understanding and agreement.  Plan:  Mr. Bayerl continue to take his medications as they are prescribed and will attend all provider appointments.  Caregiver [name] will be present for home visit tomorrow from [facility] RN.  Caregiver [name] will contact patient's PCP on Monday morning to schedule follow up appointment post-hospital discharge.  Mr. Herndon will continue to use his (new) home O2 as instructed post-hospital discharge.  Mr. Serrette continue to monitor his  blood sugars 2-3daily, and will continuerecording these values.  Mr. Michalak continue to use his walker for assistance with ambulation.  Mr. Gladson/ caregiver Loriwill contact his providers for any concerns, needs, issues, or  problems that arise, or call EMS for urgent/ emergent issues.  [place] outreach to continue for transition of care after recent hospitalization with scheduled telephone outreach next week.   [name], RN, BSN, [employer] [facility] Care Management  [phone]

## 2016-12-15 NOTE — Discharge Summary (Signed)
Physician Discharge Summary  HRIDHAAN KEIR [id number] DOB: Jun 18, 1931 DOA: 12/05/2016  PCP: Henrine Screws, MD  Admit date: 12/05/2016 Discharge date: 12/15/2016  Admitted From: home Disposition:  home   Recommendations for Outpatient Follow-up:  1. Follow CBGs closely at home 2. Bmet ad CBC in 1 wk 3. Consider sleep study 4. He has declined SNF and chooses to go home- lives alone- family plan to check on him frequently 5. Will go home with O2- please re-assess pulse ox in 1 wk  Home Health:  ordered  Equipment/Devices:   walker    Discharge Condition:  stable   CODE STATUS:  Full code   Diet recommendation:  Heart healthy, diabetic, low sodium Consultations:      Discharge Diagnoses:  Principal Problem:   Cellulitis Active Problems:   History of CVA (cerebrovascular accident)   Essential hypertension, benign   Type 1 diabetes mellitus with peripheral circulatory complications (HCC)   Venous stasis dermatitis   DM type 2 with diabetic peripheral neuropathy (HCC)   Cellulitis and abscess of leg   Paroxysmal atrial fibrillation (HCC)   Chronic kidney disease, stage III (moderate)   Acute respiratory failure (HCC)   Morbid obesity (HCC)   Hard of hearing    Subjective: Cough with congestion has resolved. No dyspnea.   Brief Summary: 83 y.o.malewith medical history significant ofHTN, HLD, CVA, CHF last EF showed 55% in4/2016, DM type II, b/l lower extremity edema;who presents with worsening lower extremity swelling. Patient was sent to the ED 7days ago for reports of his right fifth digit turning black. He was recommended to increase Lasix to 40 mg twice daily. 4 days ago he saw his PCP who started him on Augmentin which she's been taking twice daily as prescribed. He reports that he noticed that his leg started to weep yesterday. His home health nurses evaluated him reported worsening erythema and drainage from the legs. Associated symptoms include worsening  dyspnea with exertion, orthopnea, and weight gain. He denies having any fevers, chills, or chest pain.  Upon admission patient was evaluated and seen to be afebrile, heart rates up to 107, and all other vitals relatively within normal limits. Patient was started on clindamycin inthe ED . Blood and wound cultures obtained.  During this hospital course, patient was continued on abx with eventual improvement/resolution of LE cellulitis. Patient was noted to have increased work of breathing on 1/21 with CXR being unremarkable and chest CT showing L base atelectasis/ ? Consolidation. He was started on Levaquin, aggressive neb treatments and steroids.   Hospital Course:  Edema and Cellulitis of B LE with sepsis -Patient presents tachycardic, tachypneic, WBC of 13.5, and lower extremity redness, swelling, drainage. Cultures obtained in ED.   - Wound cultures obtained, demonstrating abundant pseudomonas and providenciaspecies - the patient's antibiotics have been narrowed from [place] to Cipro and then changed to Levaquin due to pneumonia - leg swelling and redness has resolved completely. No drainage noted- HHRN to follow wounds at home- family to do dressing changes.   Bilateral lower extremity swelling, likely secondary to chronic venous stasis and possibly dCHF  - edema has improved with elevation and diuretics- negative balance of 10 L- weight 125>>>119 kg - 2d echo done, normal LVEF - has LVH (may have some dCHF) -  LE dopplers negative for DVT - Family had reported that pt was noncompliant with keeping LE elevated prior to admission- currently having leg wrapped with gauze and ACE wraps for compression.   Acute  hypoxic resp failure-   -  1/21 18- CT chest >> Possible L sided consolidation noted with atelectasis - very congested with severe cough and hypoxia - Cipro (for leg wounds) changed to levaquin on 1/22 - also started on Prednisone and required Mucomyst and chest PT for  severity of congestion - lungs quite clear today- no wheeze however, pulse ox still dropping to 88 % on room air- likely has a component of atelectasis- will go home with O2   Chronic kidney disease stage III: - follow  Diabetes mellitus 2 with hypoglycemia - Decreased home Lantus from 60 units three times daily to 40 in AM and 30 in PM due to hypoglycemic episodes (despite being on steroids) - would recommend current dose at home to prevent further hypoglycemia at home - nutrition counseling done  CAD/history of CVA - continue Plavix and aspirin - Patient remains stable  History of PSVT/ PAF:  -Eliquis was reported to be discontinued and patient was started on Plavix. -at present stable  Essential hypertension  - Continue metoprolol as tolerated - Continue on norvasc 5mg  with improved BP  Hyperlipidemia - Continue simvastatin as toelrated - remains stable  Suspected OSA - Pt witnessed to have periods of apnea while sleeping - Have ordered CPAP, however pt has been declining it - Strongly advise outpatient sleep study    Discharge Instructions  Discharge Instructions    (HEART FAILURE PATIENTS) Call MD:  Anytime you have any of the following symptoms: 1) 3 pound weight gain in 24 hours or 5 pounds in 1 week 2) shortness of breath, with or without a dry hacking cough 3) swelling in the hands, feet or stomach 4) if you have to sleep on extra pillows at night in order to breathe.    Complete by:  As directed    AMB Referral to [facility]    Complete by:  As directed    Please assign to [facility] LCSW for follow up while at [facility]. SNF facility not known at this current time. Currently active with [facility] RNCM.  If for some reason discharge plans change, will need assist with community resources as lives alone and would be high risk for readmit. Thanks.Marthenia Rolling, [place], RN,BSN-[facility] [phone]   Reason for consult:   Please assign to [facility] LCSW   Expected date of contact:  1-3 days (reserved for hospital discharges)   Diet - low sodium heart healthy    Complete by:  As directed    Increase activity slowly    Complete by:  As directed      Allergies as of 12/15/2016      Reactions   Flomax [tamsulosin Hcl] Other (See Comments)   Excessive tearing   Glucophage [metformin Hcl] Diarrhea      Medication List    STOP taking these medications   amoxicillin-clavulanate 875-125 MG tablet Commonly known as:  AUGMENTIN   furosemide 80 MG tablet Commonly known as:  LASIX   HYDROcodone-acetaminophen 7.5-325 MG tablet Commonly known as:  NORCO   zolpidem 5 MG tablet Commonly known as:  AMBIEN     TAKE these medications   amitriptyline 25 MG tablet Commonly known as:  ELAVIL Take 25 mg by mouth at bedtime.   aspirin EC 81 MG tablet Take 81 mg by mouth daily.   buPROPion 150 MG 12 hr tablet Commonly known as:  WELLBUTRIN SR Take 150 mg by mouth daily.   clopidogrel 75 MG tablet  Commonly known as:  PLAVIX Take 75 mg by mouth daily.   clotrimazole-betamethasone lotion Commonly known as:  LOTRISONE Apply 1 application topically 2 (two) times daily as needed (antifungal).   fluticasone 50 MCG/ACT nasal spray Commonly known as:  FLONASE Place 1 spray into both nostrils daily.   gabapentin 300 MG capsule Commonly known as:  NEURONTIN Take 300 mg by mouth 3 (three) times daily.   guaiFENesin 600 MG 12 hr tablet Commonly known as:  MUCINEX Take 2 tablets (1,200 mg total) by mouth 2 (two) times daily as needed.   insulin glargine 100 UNIT/ML injection Commonly known as:  LANTUS Inject 0.4 mLs (40 Units total) into the skin 2 (two) times daily. What changed:  how much to take  when to take this   levofloxacin 750 MG tablet Commonly known as:  LEVAQUIN Take 1 tablet (750 mg total) by mouth daily.   metoprolol tartrate 25 MG tablet Commonly known as:  LOPRESSOR Take 1  tablet (25 mg total) by mouth 2 (two) times daily.   pantoprazole 40 MG tablet Commonly known as:  PROTONIX Take 40 mg by mouth daily.   potassium chloride SA 20 MEQ tablet Commonly known as:  K-DUR,KLOR-CON Take 20 mEq by mouth daily.   sennosides-docusate sodium 8.6-50 MG tablet Commonly known as:  SENOKOT-S Take 1 tablet by mouth daily.   simvastatin 20 MG tablet Commonly known as:  ZOCOR Take 20 mg by mouth daily.   terazosin 2 MG capsule Commonly known as:  HYTRIN Take 4 mg by mouth daily.   terbinafine 250 MG tablet Commonly known as:  LAMISIL Take 250 mg by mouth daily. continous   VITAMIN B COMPLEX PO Take 1 tablet by mouth daily.   vitamin B-12 1000 MCG tablet Commonly known as:  CYANOCOBALAMIN Take 1,000 mcg by mouth daily.            Durable Medical Equipment        Start     Ordered   12/15/16 0902  For home use only DME oxygen  Once    Question Answer Comment  Mode or (Route) Nasal cannula   Liters per Minute 2   Oxygen conserving device Yes   Oxygen delivery system Gas      12/15/16 0901     Follow-up Information    GATES,ROBERT NEVILL, MD Follow up in 1 week(s).   Specialty:  Internal Medicine Contact information: [address] [place] [address] [phone]          Allergies  Allergen Reactions  . Flomax [Tamsulosin Hcl] Other (See Comments)    Excessive tearing  . Glucophage [Metformin Hcl] Diarrhea     Procedures/Studies: 2 D ECHO Impressions:  - Normal LV size with moderate LV hypertrophy. EF 55-60%. Normal RV   size and systolic function. Aortic sclerosis without significant   stenosis.  Dg Chest 2 View  Result Date: 11/28/2016 CLINICAL DATA:  Right foot swelling for 5 months. Fifth toe is black on the tip. Shortness of breath for months. EXAM: CHEST  2 VIEW COMPARISON:  03/16/2015 FINDINGS: Cardiac enlargement without vascular congestion or edema. Chronic linear scarring in the left lung base  appears similar to prior study. No developing consolidation or airspace disease. No pleural effusions. No pneumothorax. Calcification of the aorta. Degenerative changes in the spine. IMPRESSION: Cardiac enlargement with chronic scarring in the left lung base. No acute changes since prior study. No evidence of acute consolidation. Electronically Signed  By: Lucienne Capers M.D.   On: 11/28/2016 21:25   Ct Chest W Contrast  Result Date: 12/11/2016 CLINICAL DATA:  Shortness of breath and lower extremity edema EXAM: CT CHEST WITH CONTRAST TECHNIQUE: Multidetector CT imaging of the chest was performed during intravenous contrast administration. CONTRAST:  75 mL ISOVUE-300 IOPAMIDOL (ISOVUE-300) INJECTION 61% COMPARISON:  Chest radiograph chair 16 2018 FINDINGS: Cardiovascular: There is no demonstrable thoracic aortic aneurysm or dissection. There is moderate atherosclerotic calcification in the aorta. There are scattered foci of calcification in the proximal great vessels. There are multiple scattered foci of coronary artery calcification. Pericardium is not thickened. Mediastinum/Nodes: Thyroid appears unremarkable. There is fairly extensive mediastinal fat diffusely. There is no evident thoracic adenopathy. There is moderate esophageal wall thickening in the midesophagus. Lungs/Pleura: There is patchy atelectasis in the posterior segment left lower lobe as well as in portions of the inferior lingula and left lower lobe. Similar atelectatic changes noted in the posterior right base. There is a small area of airspace consolidation in the posterior left base. There is no appreciable pleural effusion or pleural thickening. Upper Abdomen: There are small gallstones within the gallbladder. The visualized upper abdominal structures otherwise appear unremarkable. Musculoskeletal: There is degenerative change in the thoracic spine. There are no blastic or lytic bone lesions. IMPRESSION: Areas of patchy atelectasis,  primarily on the left, with a small area of consolidation posterior left base. No evident adenopathy. Multiple foci of atherosclerotic calcification including multiple foci of coronary artery calcification. Several small gallstones noted in gallbladder. Thickening of the mid esophageal wall may be indicative of chronic reflux type esophagitis. Electronically Signed   By: Lowella Grip III M.D.   On: 12/11/2016 07:09   Dg Chest Port 1 View  Result Date: 12/05/2016 CLINICAL DATA:  Shortness of breath.  Bilateral leg swelling. EXAM: PORTABLE CHEST 1 VIEW COMPARISON:  11/28/2016 FINDINGS: Cardiomegaly with tortuous atherosclerotic thoracic aorta, stable from prior exam. Left lung scarring is again seen. Streaky right infrahilar opacities favoring atelectasis. No evidence pulmonary edema. No large pleural effusion allowing for limited left lung base assessment due to habitus and portable technique. No pneumothorax. IMPRESSION: 1. Stable cardiomegaly and left lung scarring. 2. Right infrahilar atelectasis. 3. Thoracic aortic atherosclerosis. Electronically Signed   By: Jeb Levering M.D.   On: 12/05/2016 22:08   Dg Foot 2 Views Right  Result Date: 11/28/2016 CLINICAL DATA:  83 y/o M; right foot swelling and black tip of fifth toe. EXAM: RIGHT FOOT - 2 VIEW COMPARISON:  None. FINDINGS: Bones are demineralized. No acute fracture or dislocation is identified. There is loss of joint space of the ankle joint and flattening of the talar dome as well as sub talar degenerative changes and loss of height of the talus. Vascular calcifications. Extensive nonspecific soft tissue swelling of the foot and ankle. Lisfranc alignment is maintained. IMPRESSION: Loss of the ankle joint space, flattening of the talar dome, subtalar degenerative changes, and loss of height of the talus probably represents early changes of neuropathic joint. No acute fracture is identified. Diffuse soft tissue swelling of the foot and ankle.  Electronically Signed   By: Kristine Garbe M.D.   On: 11/28/2016 21:26       Discharge Exam: Vitals:   12/14/16 2145 12/15/16 0515  BP: (!) 158/77 (!) 153/75  Pulse: 92 82  Resp: 18 20  Temp: 97.8 F (36.6 C) 97.6 F (36.4 C)   Vitals:   12/14/16 2037 12/14/16 2145 12/15/16 0515 12/15/16 [id number]  BP:  (!) 158/77 (!) 153/75   Pulse: 88 92 82   Resp: 18 18 20    Temp:  97.8 F (36.6 C) 97.6 F (36.4 C)   TempSrc:  Oral Oral   SpO2: 96% 94% 97% (!) 79%  Weight:   119.8 kg (264 lb 1.8 oz)   Height:        General: Pt is alert, awake, not in acute distress Cardiovascular: RRR, S1/S2 +, no rubs, no gallops Respiratory: CTA bilaterally, no wheezing, no rhonchi Abdominal: Soft, NT, ND, bowel sounds + Extremities: no edema, no cyanosis    The results of significant diagnostics from this hospitalization (including imaging, microbiology, ancillary and laboratory) are listed below for reference.     Microbiology: Recent Results (from the past 240 hour(s))  Blood culture (routine x 2)     Status: None   Collection Time: 12/05/16  5:45 PM  Result Value Ref Range Status   Specimen Description BLOOD RIGHT ARM  Final   Special Requests IN PEDIATRIC BOTTLE 5CC  Final   Culture   Final    NO GROWTH 5 DAYS Performed at [facility], [address] [place], [place] [address]    Report Status 12/10/2016 FINAL  Final  Blood culture (routine x 2)     Status: None   Collection Time: 12/05/16  5:45 PM  Result Value Ref Range Status   Specimen Description RIGHT ANTECUBITAL  Final   Special Requests BOTTLES DRAWN AEROBIC AND ANAEROBIC 5CC  Final   Culture   Final    NO GROWTH 5 DAYS Performed at [facility], [place] [address] [place], [place] [address]    Report Status 12/10/2016 FINAL  Final  Wound or Superficial Culture     Status: None   Collection Time: 12/05/16  7:20 PM  Result Value Ref Range Status   Specimen Description LEG RIGHT  Final   Special  Requests NONE  Final   Gram Stain   Final    FEW WBC PRESENT, PREDOMINANTLY PMN NO ORGANISMS SEEN Performed at [facility], [place] [address], [place], [place] [address]    Culture   Final    ABUNDANT PSEUDOMONAS AERUGINOSA FEW PROVIDENCIA RETTGERI    Report Status 12/10/2016 FINAL  Final   Organism ID, Bacteria PSEUDOMONAS AERUGINOSA  Final   Organism ID, Bacteria PROVIDENCIA RETTGERI  Final      Susceptibility   Pseudomonas aeruginosa - MIC*    CEFTAZIDIME 4 SENSITIVE Sensitive     CIPROFLOXACIN <=0.25 SENSITIVE Sensitive     GENTAMICIN <=1 SENSITIVE Sensitive     IMIPENEM 1 SENSITIVE Sensitive     PIP/TAZO 8 SENSITIVE Sensitive     CEFEPIME 4 SENSITIVE Sensitive     * ABUNDANT PSEUDOMONAS AERUGINOSA   Providencia rettgeri - MIC*    AMPICILLIN >=32 RESISTANT Resistant     CEFAZOLIN >=64 RESISTANT Resistant     CEFEPIME <=1 SENSITIVE Sensitive     CEFTAZIDIME <=1 SENSITIVE Sensitive     CEFTRIAXONE <=1 SENSITIVE Sensitive     CIPROFLOXACIN <=0.25 SENSITIVE Sensitive     GENTAMICIN 2 SENSITIVE Sensitive     IMIPENEM 2 SENSITIVE Sensitive     TRIMETH/SULFA <=20 SENSITIVE Sensitive     AMPICILLIN/SULBACTAM >=32 RESISTANT Resistant     PIP/TAZO <=4 SENSITIVE Sensitive     * FEW PROVIDENCIA RETTGERI     Labs: BNP (last 3 results)  Recent Labs  11/28/16 2010 12/05/16 1741 12/09/16 1312  BNP 31.2  37.8 [id number]*   Basic Metabolic Panel:  Recent Labs Lab 12/10/16 0530 12/11/16 0518 12/12/16 0800 12/13/16 0555 12/15/16 0537  NA 139 140 141 143 144  K 4.4 4.3 4.5 5.0 5.2*  CL 102 100* 98* 99* 99*  CO2 31 34* 36* 36* 40*  GLUCOSE 117* 122* 67 73 111*  BUN 28* 31* 34* 34* 40*  CREATININE 1.16 1.24 1.11 1.30* 1.41*  CALCIUM 9.2 9.4 9.4 9.6 9.6   Liver Function Tests:  Recent Labs Lab 12/09/16 0528  AST 20  ALT 21  ALKPHOS 68  BILITOT 0.4  PROT 6.7  ALBUMIN 3.0*   No results for input(s): LIPASE, AMYLASE in the last 168 hours. No results for input(s):  AMMONIA in the last 168 hours. CBC:  Recent Labs Lab 12/09/16 0528 12/10/16 0530 12/14/16 0528 12/15/16 0537  WBC 13.5* 9.8 9.2 12.9*  HGB 11.3* 11.6* 13.3 13.3  HCT 37.2* 37.3* 42.6 44.9  MCV 70.2* 69.6* 69.3* 70.9*  PLT 291 287 302 288   Cardiac Enzymes: No results for input(s): CKTOTAL, CKMB, CKMBINDEX, TROPONINI in the last 168 hours. BNP: Invalid input(s): POCBNP CBG:  Recent Labs Lab 12/14/16 0741 12/14/16 1157 12/14/16 1640 12/14/16 2151 12/15/16 0726  GLUCAP 63* 221* 267* 219* 105*   D-Dimer No results for input(s): DDIMER in the last 72 hours. Hgb A1c No results for input(s): HGBA1C in the last 72 hours. Lipid Profile No results for input(s): CHOL, HDL, LDLCALC, TRIG, CHOLHDL, LDLDIRECT in the last 72 hours. Thyroid function studies No results for input(s): TSH, T4TOTAL, T3FREE, THYROIDAB in the last 72 hours.  Invalid input(s): FREET3 Anemia work up No results for input(s): VITAMINB12, FOLATE, FERRITIN, TIBC, IRON, RETICCTPCT in the last 72 hours. Urinalysis    Component Value Date/Time   COLORURINE YELLOW 12/07/2016 0640   APPEARANCEUR CLEAR 12/07/2016 0640   LABSPEC 1.018 12/07/2016 0640   PHURINE 6.0 12/07/2016 0640   GLUCOSEU 50 (A) 12/07/2016 0640   HGBUR NEGATIVE 12/07/2016 0640   BILIRUBINUR NEGATIVE 12/07/2016 0640   KETONESUR NEGATIVE 12/07/2016 0640   PROTEINUR NEGATIVE 12/07/2016 0640   UROBILINOGEN 1.0 12/06/2014 1414   NITRITE NEGATIVE 12/07/2016 0640   LEUKOCYTESUR NEGATIVE 12/07/2016 0640   Sepsis Labs Invalid input(s): PROCALCITONIN,  WBC,  LACTICIDVEN Microbiology Recent Results (from the past 240 hour(s))  Blood culture (routine x 2)     Status: None   Collection Time: 12/05/16  5:45 PM  Result Value Ref Range Status   Specimen Description BLOOD RIGHT ARM  Final   Special Requests IN PEDIATRIC BOTTLE 5CC  Final   Culture   Final    NO GROWTH 5 DAYS Performed at [facility], [place] [address], [place], [place]  [address]    Report Status 12/10/2016 FINAL  Final  Blood culture (routine x 2)     Status: None   Collection Time: 12/05/16  5:45 PM  Result Value Ref Range Status   Specimen Description RIGHT ANTECUBITAL  Final   Special Requests BOTTLES DRAWN AEROBIC AND ANAEROBIC 5CC  Final   Culture   Final    NO GROWTH 5 DAYS Performed at [facility], [place] [address], [place], [place] [address]    Report Status 12/10/2016 FINAL  Final  Wound or Superficial Culture     Status: None   Collection Time: 12/05/16  7:20 PM  Result Value Ref Range Status   Specimen Description LEG RIGHT  Final   Special Requests NONE  Final   Gram  Stain   Final    FEW WBC PRESENT, PREDOMINANTLY PMN NO ORGANISMS SEEN Performed at [facility], [place] [address] [place], [place] [address]    Culture   Final    ABUNDANT PSEUDOMONAS AERUGINOSA FEW PROVIDENCIA RETTGERI    Report Status 12/10/2016 FINAL  Final   Organism ID, Bacteria PSEUDOMONAS AERUGINOSA  Final   Organism ID, Bacteria PROVIDENCIA RETTGERI  Final      Susceptibility   Pseudomonas aeruginosa - MIC*    CEFTAZIDIME 4 SENSITIVE Sensitive     CIPROFLOXACIN <=0.25 SENSITIVE Sensitive     GENTAMICIN <=1 SENSITIVE Sensitive     IMIPENEM 1 SENSITIVE Sensitive     PIP/TAZO 8 SENSITIVE Sensitive     CEFEPIME 4 SENSITIVE Sensitive     * ABUNDANT PSEUDOMONAS AERUGINOSA   Providencia rettgeri - MIC*    AMPICILLIN >=32 RESISTANT Resistant     CEFAZOLIN >=64 RESISTANT Resistant     CEFEPIME <=1 SENSITIVE Sensitive     CEFTAZIDIME <=1 SENSITIVE Sensitive     CEFTRIAXONE <=1 SENSITIVE Sensitive     CIPROFLOXACIN <=0.25 SENSITIVE Sensitive     GENTAMICIN 2 SENSITIVE Sensitive     IMIPENEM 2 SENSITIVE Sensitive     TRIMETH/SULFA <=20 SENSITIVE Sensitive     AMPICILLIN/SULBACTAM >=32 RESISTANT Resistant     PIP/TAZO <=4 SENSITIVE Sensitive     * FEW PROVIDENCIA RETTGERI     Time coordinating discharge: Over 30 minutes  SIGNED:   Debbe Odea,  MD  [facility] 12/15/2016, 9:35 AM Pager   If 7PM-7AM, please contact night-coverage [email or web address] Password [email or web address]

## 2016-12-15 NOTE — Discharge Instructions (Signed)
Wound care to bilateral LE's:  Cleanse with NS, pat gently dry.  Cover affected areas on the right lateral/anterior leg and left anterior leg with single layer of xeroform gauze (opened, not folded). Top with dry gauze 4x4s, wrap from toe to knee with Kerlix roll gauze.  Cover Kerlix wrap with ACE bandage wrapped in a similar fashion. Place feet into Prevalon boots per protocol.  Please take all your medications with you for your next visit with your Primary MD. See you doctor in 1 wk.   You must read the complete instructions/literature along with all the possible adverse reactions/side effects for all the medicines you take including new medications that have been prescribed to you. Take new medicines after you have completely understood and accpet all the possible adverse reactions/side effects.   Please request your Primary MD to go over all hospital test results at the follow up. Please ask your Primary MD to get all Hospital records sent to his/her office.  If you experience worsening of your admission symptoms, develop shortness of breath, chest pain, suicidal or homicidal thoughts or a life threatening emergency, you must seek medical attention immediately by calling 911 or calling your MD.  Do not drive when taking pain medications or sedatives.    Do not take more than prescribed Pain, Sleep and Anxiety Medications  If you have smoked or chewed Tobacco in the last 2 yrs please stop. Stop any regular alcohol and or recreational drug use.  Wear Seat belts while driving.

## 2016-12-15 NOTE — Progress Notes (Signed)
Physical Therapy Treatment Patient Details Name: Jared Hall MRN: [id number] DOB: 1931-11-19 Today's Date: 12/15/2016    History of Present Illness Pt is an 83 year old male admitted for cellulitis.  PMH significant for CVA, CHF, DM type 2, bil LE edema    PT Comments    Progressing slowly with mobility. Pt has refused SNF placement. Recommend 24 hour supervision/assist if pt returns home.   Follow Up Recommendations  Home health PT;Supervision/Assistance - 24 hour (pt is refusing SNF)     Equipment Recommendations  None recommended by PT    Recommendations for Other Services       Precautions / Restrictions Precautions Precautions: Fall Precaution Comments: HOH; monitor vitals Restrictions Weight Bearing Restrictions: No    Mobility  Bed Mobility         Supine to sit: Total assist     General bed mobility comments: oob in recliner  Transfers Overall transfer level: Needs assistance Equipment used: Rolling walker (2 wheeled) Transfers: Sit to/from Stand Sit to Stand: Min assist         General transfer comment: steadying assistance.   Ambulation/Gait Ambulation/Gait assistance: Min assist Ambulation Distance (Feet): 75 Feet Assistive device: Rolling walker (2 wheeled)       General Gait Details: Intermittent assist to stabilize. Pt tolerated distance well.    Stairs            Wheelchair Mobility    Modified Rankin (Stroke Patients Only)       Balance                                    Cognition Arousal/Alertness: Awake/alert Behavior During Therapy: WFL for tasks assessed/performed Overall Cognitive Status: Within Functional Limits for tasks assessed                 General Comments: some difficulty due to pt being significantly HOH    Exercises      General Comments        Pertinent Vitals/Pain Pain Assessment: No/denies pain    Home Living                      Prior Function           PT Goals (current goals can now be found in the care plan section) Progress towards PT goals: Progressing toward goals    Frequency    Min 3X/week      PT Plan Current plan remains appropriate    Co-evaluation             End of Session Equipment Utilized During Treatment: Gait belt Activity Tolerance: Patient tolerated treatment well Patient left: in chair;with call bell/phone within reach;with chair alarm set     Time: [id number] PT Time Calculation (min) (ACUTE ONLY): 15 min  Charges:  $Gait Training: 8-22 mins                    G Codes:      Weston Anna, MPT Pager: [phone]

## 2016-12-16 DIAGNOSIS — L97811 Non-pressure chronic ulcer of other part of right lower leg limited to breakdown of skin: Secondary | ICD-10-CM | POA: Diagnosis not present

## 2016-12-16 DIAGNOSIS — E1151 Type 2 diabetes mellitus with diabetic peripheral angiopathy without gangrene: Secondary | ICD-10-CM | POA: Diagnosis not present

## 2016-12-16 DIAGNOSIS — L03115 Cellulitis of right lower limb: Secondary | ICD-10-CM | POA: Diagnosis not present

## 2016-12-16 DIAGNOSIS — N183 Chronic kidney disease, stage 3 (moderate): Secondary | ICD-10-CM | POA: Diagnosis not present

## 2016-12-16 DIAGNOSIS — Z794 Long term (current) use of insulin: Secondary | ICD-10-CM | POA: Diagnosis not present

## 2016-12-16 DIAGNOSIS — Z8673 Personal history of transient ischemic attack (TIA), and cerebral infarction without residual deficits: Secondary | ICD-10-CM | POA: Diagnosis not present

## 2016-12-16 DIAGNOSIS — L97821 Non-pressure chronic ulcer of other part of left lower leg limited to breakdown of skin: Secondary | ICD-10-CM | POA: Diagnosis not present

## 2016-12-16 DIAGNOSIS — I2510 Atherosclerotic heart disease of native coronary artery without angina pectoris: Secondary | ICD-10-CM | POA: Diagnosis not present

## 2016-12-16 DIAGNOSIS — Z7982 Long term (current) use of aspirin: Secondary | ICD-10-CM | POA: Diagnosis not present

## 2016-12-16 DIAGNOSIS — Z7902 Long term (current) use of antithrombotics/antiplatelets: Secondary | ICD-10-CM | POA: Diagnosis not present

## 2016-12-16 DIAGNOSIS — Z4800 Encounter for change or removal of nonsurgical wound dressing: Secondary | ICD-10-CM | POA: Diagnosis not present

## 2016-12-16 DIAGNOSIS — I509 Heart failure, unspecified: Secondary | ICD-10-CM | POA: Diagnosis not present

## 2016-12-16 DIAGNOSIS — I872 Venous insufficiency (chronic) (peripheral): Secondary | ICD-10-CM | POA: Diagnosis not present

## 2016-12-16 DIAGNOSIS — Z9981 Dependence on supplemental oxygen: Secondary | ICD-10-CM | POA: Diagnosis not present

## 2016-12-16 DIAGNOSIS — I130 Hypertensive heart and chronic kidney disease with heart failure and stage 1 through stage 4 chronic kidney disease, or unspecified chronic kidney disease: Secondary | ICD-10-CM | POA: Diagnosis not present

## 2016-12-16 DIAGNOSIS — E1122 Type 2 diabetes mellitus with diabetic chronic kidney disease: Secondary | ICD-10-CM | POA: Diagnosis not present

## 2016-12-16 DIAGNOSIS — L03116 Cellulitis of left lower limb: Secondary | ICD-10-CM | POA: Diagnosis not present

## 2016-12-16 DIAGNOSIS — I480 Paroxysmal atrial fibrillation: Secondary | ICD-10-CM | POA: Diagnosis not present

## 2016-12-16 DIAGNOSIS — E1142 Type 2 diabetes mellitus with diabetic polyneuropathy: Secondary | ICD-10-CM | POA: Diagnosis not present

## 2016-12-18 ENCOUNTER — Encounter: Payer: Self-pay | Admitting: *Deleted

## 2016-12-18 ENCOUNTER — Other Ambulatory Visit: Payer: Self-pay | Admitting: *Deleted

## 2016-12-18 DIAGNOSIS — E1142 Type 2 diabetes mellitus with diabetic polyneuropathy: Secondary | ICD-10-CM | POA: Diagnosis not present

## 2016-12-18 DIAGNOSIS — I872 Venous insufficiency (chronic) (peripheral): Secondary | ICD-10-CM | POA: Diagnosis not present

## 2016-12-18 DIAGNOSIS — L03115 Cellulitis of right lower limb: Secondary | ICD-10-CM | POA: Diagnosis not present

## 2016-12-18 DIAGNOSIS — L03116 Cellulitis of left lower limb: Secondary | ICD-10-CM | POA: Diagnosis not present

## 2016-12-18 DIAGNOSIS — L97821 Non-pressure chronic ulcer of other part of left lower leg limited to breakdown of skin: Secondary | ICD-10-CM | POA: Diagnosis not present

## 2016-12-18 DIAGNOSIS — L97811 Non-pressure chronic ulcer of other part of right lower leg limited to breakdown of skin: Secondary | ICD-10-CM | POA: Diagnosis not present

## 2016-12-18 NOTE — Patient Outreach (Signed)
[place] [facility]) Care Management [place] telephone Outreach, Transition of care day 4 12/18/2016  QUADRI PERDOMO November 05, 1931 [id number]  Unsuccessful telephone outreach to [name], daughter-in-law/ caregiver (on [facility] CM written consent), of Jared Hall, 84 y/o male referred to [place] from [facility] telephonic RN CM, who received initial high risk referral from MD office. Patient has history including, but not limited to CHF, DM, HTN, HLD, CVA, CKD, paroxysmal AF. Patient has had recent hospital visit January 16-26, 2018 for bilateral LE cellulitis with sepsis, ARF with hypoxia, and pneumonia.  Patient was discharged home after refusing SNF placement for rehabilitation with (new) home O2 therapy and home health [facility]) services through [place].    HIPAA compliant voice mail message left for patient' caregiver Jared Hall, requesting return call back.  Plan:  [place] outreach to continue for transition of care after recent hospitalization with scheduled telephone outreach tomorrow.  [name], RN, BSN, [employer] [facility] Care Management  [phone]

## 2016-12-19 ENCOUNTER — Other Ambulatory Visit: Payer: Self-pay | Admitting: *Deleted

## 2016-12-19 ENCOUNTER — Encounter: Payer: Self-pay | Admitting: *Deleted

## 2016-12-19 DIAGNOSIS — L97811 Non-pressure chronic ulcer of other part of right lower leg limited to breakdown of skin: Secondary | ICD-10-CM | POA: Diagnosis not present

## 2016-12-19 DIAGNOSIS — L97821 Non-pressure chronic ulcer of other part of left lower leg limited to breakdown of skin: Secondary | ICD-10-CM | POA: Diagnosis not present

## 2016-12-19 DIAGNOSIS — I872 Venous insufficiency (chronic) (peripheral): Secondary | ICD-10-CM | POA: Diagnosis not present

## 2016-12-19 DIAGNOSIS — L03116 Cellulitis of left lower limb: Secondary | ICD-10-CM | POA: Diagnosis not present

## 2016-12-19 DIAGNOSIS — L03115 Cellulitis of right lower limb: Secondary | ICD-10-CM | POA: Diagnosis not present

## 2016-12-19 DIAGNOSIS — E1142 Type 2 diabetes mellitus with diabetic polyneuropathy: Secondary | ICD-10-CM | POA: Diagnosis not present

## 2016-12-19 NOTE — Patient Outreach (Signed)
[place] [facility]) Care Management [place] Telephone Outreach, Transition of Care day 5  12/19/2016  RHYLAND DEKAY [birth year] [id number]  Unsuccessful telephone outreach x 2 to [facility], daughter-in-law/ caregiver (on [facility] CM written consent), of Jovonni Lafountaine, 83 y/o male referred to [place] from [facility] telephonic RN CM, who received initial high risk referral from MD office. Patient has history including, but not limited to CHF, DM, HTN, HLD, CVA, CKD, paroxysmal AF. Patient has had recent hospital visit January 16-26, 2018 for bilateral LE cellulitis with sepsis, ARF with hypoxia, and pneumonia. Patientwas discharged home after refusing SNF placement for rehabilitation with (new) home O2 therapy and home health [facility]) services through [place].   HIPAA compliant voice mail message left for patient' caregiver [name], requesting return call back.  Plan:  [place] outreach to continue for transition of care after recent hospitalization with scheduled telephone later this week.  [name], RN, BSN, [employer] [facility] Care Management  [phone]

## 2016-12-19 NOTE — Patient Outreach (Signed)
[facility]) Care Management [place] Telephone Outreach, Transition of Care day 5  12/19/2016  KENANIAH BUSSARD 02-22-1931 [id number]  Successful telephone outreach to [facility], daughter-in-law/ caregiver (on [facility] CM written consent), of Calven Sanker, 83 y/o male referred to [place] from [facility] telephonic RN CM, who received initial high risk referral from MD office. Patient has history including, but not limited to CHF, DM, HTN, HLD, CVA, CKD, paroxysmal AF. Patient has had recent hospital visit January 16-26, 2018 for bilateral LE cellulitis with sepsis, ARF with hypoxia, and pneumonia.  Patient was discharged home today after refusing SNF placement for rehabilitation with (new) home O2 therapy and home health [facility]) services through [place].  HIPAA/ identity verified today by phone today with [name].   Today, [name] reports that "things are fairly stable," but adds that she is actively working with PCP office to secure patient a bed for rehabilitation at SNF, which was refused by patient at time of hospital discharge.  -- Level of Care needs/ SNF placement for rehabilitation:  states that PCP office has advised [name] to contact [place] to obtain FL-2 and determine bed availability; [name] confirmed that she has phone number to [employer].  Discussed with [name] that FL-2 at hospital was completed on 12/12/16, and to inquire if that FL-2 form was sent to [facility], given that patient refused.  I also advised [name] to maintain contact as needed with PCP office staff, as she is unfamiliar with process to take to get patient placed in SNF for rehabilitation.  [name] agreed to contact me for further issues/ questions about this process, if needed.  [name] reports that patient has continued having 24-hour private duty sitters/ care at home for now, but stated, "it is getting expensive, he doesn't have the money to have this kind of round the clock care, and he definitely  needs constant supervision."  -- [facility] HH services in place; RN completed visit this morning, PT to come tomorrow, next [facility] RN visit scheduled for Thursday to wrap legs and draw blood.  Patient reported to be wearing home O2 as ordered post-hospital discharge.  -- PCP appointment scheduled for morning of Friday 12/22/16; [name] states she will transport patient to PCP appointment.  -- Medications:  [name] states medications to be reviewed by PCP during [place] on Friday 12/22/16; states "for now" patient has all of his medications and is taking as prescribed, with assistance of private duty caregivers/ family.  States has completed antibiotics prescribed at time of discharge.  Voices no concerns today with patient's medications.  -- reports patient eating "pretty good," states he developed diarrhea post-hospital discharge, presumably "from the antibiotics," which is "slowly but surely getting better."   [name] denies further issues, concerns, or problems today.  I confirmed that patient/ caregiver has my direct phone number, the main [facility] CM office phone number, and the [facility] CM 24-hour nurse advice phone number should issues arise prior to next scheduled [place] outreach.  Plan:  Mr. Delmastro continue to take his medications as they are prescribed and will attend all provider appointments.  Caregiver [name] will contact SNF/ maintain contact with PCP staff to facilitate patient placement in SNF for rehabilitation post-hospital discharge  Mr. Eckenroth will continue to use his (new) home O2 as instructed post-hospital discharge.  Mr. Furno continue to monitor his blood sugars 2-3daily, and will continuerecording these values.  Mr. Pfuhl continue to use his walker for assistance with  ambulation.  Mr. Mcmichael/ caregiver Loriwill contact his providers for any concerns, needs, issues, or problems that arise, or call EMS for urgent/ emergent issues.  [place] outreach to continue for  transition of care after recent hospitalization with scheduled telephone outreach next week.  [name], RN, BSN, [employer] [facility] Care Management  [phone]

## 2016-12-20 ENCOUNTER — Encounter: Payer: Self-pay | Admitting: *Deleted

## 2016-12-20 ENCOUNTER — Other Ambulatory Visit: Payer: Self-pay | Admitting: *Deleted

## 2016-12-20 DIAGNOSIS — L03115 Cellulitis of right lower limb: Secondary | ICD-10-CM | POA: Diagnosis not present

## 2016-12-20 DIAGNOSIS — E1142 Type 2 diabetes mellitus with diabetic polyneuropathy: Secondary | ICD-10-CM | POA: Diagnosis not present

## 2016-12-20 DIAGNOSIS — L97821 Non-pressure chronic ulcer of other part of left lower leg limited to breakdown of skin: Secondary | ICD-10-CM | POA: Diagnosis not present

## 2016-12-20 DIAGNOSIS — I872 Venous insufficiency (chronic) (peripheral): Secondary | ICD-10-CM | POA: Diagnosis not present

## 2016-12-20 DIAGNOSIS — L97811 Non-pressure chronic ulcer of other part of right lower leg limited to breakdown of skin: Secondary | ICD-10-CM | POA: Diagnosis not present

## 2016-12-20 DIAGNOSIS — L03116 Cellulitis of left lower limb: Secondary | ICD-10-CM | POA: Diagnosis not present

## 2016-12-20 NOTE — Patient Outreach (Signed)
[place] [facility]) Care Management [place] Telephone Outreach, Care Coordination 12/20/2016  [name] [birth year] [id number]   Unsuccessful telephone outreach to Jared Hall, nurse of Dr. Inda Merlin, PCP [phone]) of Jared Hall, [place] y.o.malereferred to [place] from [facility] telephonic RN CM, who received initial high risk referral from MD office. Patient has history including, but not limited to CHF, DM, HTN, HLD, CVA, CKD, paroxysmal AF.   Call was placed to Dr. Inda Merlin today as a result of phone conversation yesterday with patient's caregiver/ daughter-in-law, Jared Hall who reported that Dr. Inda Merlin was working to facilitate placement of patient in SNF [place] for rehabilitation post-hospital discharge.  Through review of EMR hospital notes, I had verified that FL-2 form was completed at hospital on December 12, 2016.  Secure voicemail message was left for Jared Hall/ Dr. Inda Merlin informing of same.   Plan:  Will collaborate with Dr. Inda Merlin as indicated to facilitate patient placement in SNF.  [name], RN, BSN, [employer] [facility] Care Management  [phone]

## 2016-12-21 ENCOUNTER — Ambulatory Visit: Payer: Self-pay | Admitting: *Deleted

## 2016-12-21 DIAGNOSIS — I509 Heart failure, unspecified: Secondary | ICD-10-CM | POA: Diagnosis not present

## 2016-12-21 DIAGNOSIS — L97811 Non-pressure chronic ulcer of other part of right lower leg limited to breakdown of skin: Secondary | ICD-10-CM | POA: Diagnosis not present

## 2016-12-21 DIAGNOSIS — L03116 Cellulitis of left lower limb: Secondary | ICD-10-CM | POA: Diagnosis not present

## 2016-12-21 DIAGNOSIS — I872 Venous insufficiency (chronic) (peripheral): Secondary | ICD-10-CM | POA: Diagnosis not present

## 2016-12-21 DIAGNOSIS — E1151 Type 2 diabetes mellitus with diabetic peripheral angiopathy without gangrene: Secondary | ICD-10-CM | POA: Diagnosis not present

## 2016-12-21 DIAGNOSIS — L03115 Cellulitis of right lower limb: Secondary | ICD-10-CM | POA: Diagnosis not present

## 2016-12-21 DIAGNOSIS — E1142 Type 2 diabetes mellitus with diabetic polyneuropathy: Secondary | ICD-10-CM | POA: Diagnosis not present

## 2016-12-21 DIAGNOSIS — L97821 Non-pressure chronic ulcer of other part of left lower leg limited to breakdown of skin: Secondary | ICD-10-CM | POA: Diagnosis not present

## 2016-12-22 DIAGNOSIS — L03119 Cellulitis of unspecified part of limb: Secondary | ICD-10-CM | POA: Diagnosis not present

## 2016-12-22 DIAGNOSIS — E11649 Type 2 diabetes mellitus with hypoglycemia without coma: Secondary | ICD-10-CM | POA: Diagnosis present

## 2016-12-22 DIAGNOSIS — N400 Enlarged prostate without lower urinary tract symptoms: Secondary | ICD-10-CM | POA: Diagnosis not present

## 2016-12-22 DIAGNOSIS — E669 Obesity, unspecified: Secondary | ICD-10-CM | POA: Diagnosis not present

## 2016-12-22 DIAGNOSIS — R404 Transient alteration of awareness: Secondary | ICD-10-CM | POA: Diagnosis not present

## 2016-12-22 DIAGNOSIS — G9340 Encephalopathy, unspecified: Secondary | ICD-10-CM | POA: Diagnosis not present

## 2016-12-22 DIAGNOSIS — E860 Dehydration: Secondary | ICD-10-CM | POA: Diagnosis not present

## 2016-12-22 DIAGNOSIS — G9341 Metabolic encephalopathy: Secondary | ICD-10-CM | POA: Diagnosis present

## 2016-12-22 DIAGNOSIS — K219 Gastro-esophageal reflux disease without esophagitis: Secondary | ICD-10-CM | POA: Diagnosis not present

## 2016-12-22 DIAGNOSIS — F339 Major depressive disorder, recurrent, unspecified: Secondary | ICD-10-CM | POA: Diagnosis not present

## 2016-12-22 DIAGNOSIS — L97821 Non-pressure chronic ulcer of other part of left lower leg limited to breakdown of skin: Secondary | ICD-10-CM | POA: Diagnosis not present

## 2016-12-22 DIAGNOSIS — Y92129 Unspecified place in nursing home as the place of occurrence of the external cause: Secondary | ICD-10-CM | POA: Diagnosis not present

## 2016-12-22 DIAGNOSIS — I10 Essential (primary) hypertension: Secondary | ICD-10-CM | POA: Diagnosis not present

## 2016-12-22 DIAGNOSIS — W1830XA Fall on same level, unspecified, initial encounter: Secondary | ICD-10-CM | POA: Diagnosis present

## 2016-12-22 DIAGNOSIS — R05 Cough: Secondary | ICD-10-CM | POA: Diagnosis not present

## 2016-12-22 DIAGNOSIS — M179 Osteoarthritis of knee, unspecified: Secondary | ICD-10-CM | POA: Diagnosis not present

## 2016-12-22 DIAGNOSIS — I480 Paroxysmal atrial fibrillation: Secondary | ICD-10-CM | POA: Diagnosis not present

## 2016-12-22 DIAGNOSIS — I130 Hypertensive heart and chronic kidney disease with heart failure and stage 1 through stage 4 chronic kidney disease, or unspecified chronic kidney disease: Secondary | ICD-10-CM | POA: Diagnosis present

## 2016-12-22 DIAGNOSIS — R278 Other lack of coordination: Secondary | ICD-10-CM | POA: Diagnosis not present

## 2016-12-22 DIAGNOSIS — E0840 Diabetes mellitus due to underlying condition with diabetic neuropathy, unspecified: Secondary | ICD-10-CM | POA: Diagnosis not present

## 2016-12-22 DIAGNOSIS — Z8673 Personal history of transient ischemic attack (TIA), and cerebral infarction without residual deficits: Secondary | ICD-10-CM | POA: Diagnosis not present

## 2016-12-22 DIAGNOSIS — R29810 Facial weakness: Secondary | ICD-10-CM | POA: Diagnosis present

## 2016-12-22 DIAGNOSIS — J309 Allergic rhinitis, unspecified: Secondary | ICD-10-CM | POA: Diagnosis not present

## 2016-12-22 DIAGNOSIS — R4182 Altered mental status, unspecified: Secondary | ICD-10-CM | POA: Diagnosis not present

## 2016-12-22 DIAGNOSIS — R4701 Aphasia: Secondary | ICD-10-CM | POA: Diagnosis present

## 2016-12-22 DIAGNOSIS — F322 Major depressive disorder, single episode, severe without psychotic features: Secondary | ICD-10-CM | POA: Diagnosis not present

## 2016-12-22 DIAGNOSIS — R0989 Other specified symptoms and signs involving the circulatory and respiratory systems: Secondary | ICD-10-CM | POA: Diagnosis not present

## 2016-12-22 DIAGNOSIS — R262 Difficulty in walking, not elsewhere classified: Secondary | ICD-10-CM | POA: Diagnosis not present

## 2016-12-22 DIAGNOSIS — M6281 Muscle weakness (generalized): Secondary | ICD-10-CM | POA: Diagnosis not present

## 2016-12-22 DIAGNOSIS — L03115 Cellulitis of right lower limb: Secondary | ICD-10-CM | POA: Diagnosis present

## 2016-12-22 DIAGNOSIS — Z79899 Other long term (current) drug therapy: Secondary | ICD-10-CM | POA: Diagnosis not present

## 2016-12-22 DIAGNOSIS — L0390 Cellulitis, unspecified: Secondary | ICD-10-CM | POA: Diagnosis present

## 2016-12-22 DIAGNOSIS — B351 Tinea unguium: Secondary | ICD-10-CM | POA: Diagnosis not present

## 2016-12-22 DIAGNOSIS — L97811 Non-pressure chronic ulcer of other part of right lower leg limited to breakdown of skin: Secondary | ICD-10-CM | POA: Diagnosis not present

## 2016-12-22 DIAGNOSIS — R41841 Cognitive communication deficit: Secondary | ICD-10-CM | POA: Diagnosis not present

## 2016-12-22 DIAGNOSIS — J9611 Chronic respiratory failure with hypoxia: Secondary | ICD-10-CM | POA: Diagnosis not present

## 2016-12-22 DIAGNOSIS — R5383 Other fatigue: Secondary | ICD-10-CM | POA: Diagnosis not present

## 2016-12-22 DIAGNOSIS — Z794 Long term (current) use of insulin: Secondary | ICD-10-CM | POA: Diagnosis not present

## 2016-12-22 DIAGNOSIS — D560 Alpha thalassemia: Secondary | ICD-10-CM | POA: Diagnosis present

## 2016-12-22 DIAGNOSIS — R600 Localized edema: Secondary | ICD-10-CM | POA: Diagnosis not present

## 2016-12-22 DIAGNOSIS — Z6839 Body mass index (BMI) 39.0-39.9, adult: Secondary | ICD-10-CM | POA: Diagnosis not present

## 2016-12-22 DIAGNOSIS — I872 Venous insufficiency (chronic) (peripheral): Secondary | ICD-10-CM | POA: Diagnosis not present

## 2016-12-22 DIAGNOSIS — I110 Hypertensive heart disease with heart failure: Secondary | ICD-10-CM | POA: Diagnosis not present

## 2016-12-22 DIAGNOSIS — R471 Dysarthria and anarthria: Secondary | ICD-10-CM | POA: Diagnosis present

## 2016-12-22 DIAGNOSIS — Z8701 Personal history of pneumonia (recurrent): Secondary | ICD-10-CM | POA: Diagnosis not present

## 2016-12-22 DIAGNOSIS — N179 Acute kidney failure, unspecified: Secondary | ICD-10-CM | POA: Diagnosis not present

## 2016-12-22 DIAGNOSIS — R5381 Other malaise: Secondary | ICD-10-CM | POA: Diagnosis not present

## 2016-12-22 DIAGNOSIS — I63311 Cerebral infarction due to thrombosis of right middle cerebral artery: Secondary | ICD-10-CM | POA: Diagnosis not present

## 2016-12-22 DIAGNOSIS — E785 Hyperlipidemia, unspecified: Secondary | ICD-10-CM | POA: Diagnosis present

## 2016-12-22 DIAGNOSIS — N390 Urinary tract infection, site not specified: Secondary | ICD-10-CM | POA: Diagnosis not present

## 2016-12-22 DIAGNOSIS — I5032 Chronic diastolic (congestive) heart failure: Secondary | ICD-10-CM | POA: Diagnosis present

## 2016-12-22 DIAGNOSIS — E559 Vitamin D deficiency, unspecified: Secondary | ICD-10-CM | POA: Diagnosis not present

## 2016-12-22 DIAGNOSIS — Z9181 History of falling: Secondary | ICD-10-CM | POA: Diagnosis not present

## 2016-12-22 DIAGNOSIS — I639 Cerebral infarction, unspecified: Secondary | ICD-10-CM | POA: Diagnosis not present

## 2016-12-22 DIAGNOSIS — R4781 Slurred speech: Secondary | ICD-10-CM | POA: Diagnosis present

## 2016-12-22 DIAGNOSIS — L03116 Cellulitis of left lower limb: Secondary | ICD-10-CM | POA: Diagnosis present

## 2016-12-22 DIAGNOSIS — I672 Cerebral atherosclerosis: Secondary | ICD-10-CM | POA: Diagnosis not present

## 2016-12-22 DIAGNOSIS — A0472 Enterocolitis due to Clostridium difficile, not specified as recurrent: Secondary | ICD-10-CM | POA: Diagnosis present

## 2016-12-22 DIAGNOSIS — N183 Chronic kidney disease, stage 3 (moderate): Secondary | ICD-10-CM | POA: Diagnosis not present

## 2016-12-22 DIAGNOSIS — J9602 Acute respiratory failure with hypercapnia: Secondary | ICD-10-CM | POA: Diagnosis not present

## 2016-12-22 DIAGNOSIS — I8311 Varicose veins of right lower extremity with inflammation: Secondary | ICD-10-CM | POA: Diagnosis not present

## 2016-12-22 DIAGNOSIS — E1142 Type 2 diabetes mellitus with diabetic polyneuropathy: Secondary | ICD-10-CM | POA: Diagnosis not present

## 2016-12-22 DIAGNOSIS — J9622 Acute and chronic respiratory failure with hypercapnia: Secondary | ICD-10-CM | POA: Diagnosis present

## 2016-12-25 ENCOUNTER — Encounter: Payer: Self-pay | Admitting: *Deleted

## 2016-12-25 ENCOUNTER — Non-Acute Institutional Stay (SKILLED_NURSING_FACILITY): Payer: Medicare Other | Admitting: Adult Health

## 2016-12-25 ENCOUNTER — Other Ambulatory Visit: Payer: Self-pay | Admitting: *Deleted

## 2016-12-25 ENCOUNTER — Encounter: Payer: Self-pay | Admitting: Adult Health

## 2016-12-25 DIAGNOSIS — I10 Essential (primary) hypertension: Secondary | ICD-10-CM

## 2016-12-25 DIAGNOSIS — D72829 Elevated white blood cell count, unspecified: Secondary | ICD-10-CM

## 2016-12-25 DIAGNOSIS — F339 Major depressive disorder, recurrent, unspecified: Secondary | ICD-10-CM

## 2016-12-25 DIAGNOSIS — L03119 Cellulitis of unspecified part of limb: Secondary | ICD-10-CM | POA: Diagnosis not present

## 2016-12-25 DIAGNOSIS — E785 Hyperlipidemia, unspecified: Secondary | ICD-10-CM

## 2016-12-25 DIAGNOSIS — R5381 Other malaise: Secondary | ICD-10-CM

## 2016-12-25 DIAGNOSIS — K219 Gastro-esophageal reflux disease without esophagitis: Secondary | ICD-10-CM | POA: Diagnosis not present

## 2016-12-25 DIAGNOSIS — J309 Allergic rhinitis, unspecified: Secondary | ICD-10-CM

## 2016-12-25 DIAGNOSIS — N183 Chronic kidney disease, stage 3 unspecified: Secondary | ICD-10-CM

## 2016-12-25 DIAGNOSIS — R600 Localized edema: Secondary | ICD-10-CM

## 2016-12-25 DIAGNOSIS — N400 Enlarged prostate without lower urinary tract symptoms: Secondary | ICD-10-CM | POA: Diagnosis not present

## 2016-12-25 DIAGNOSIS — E876 Hypokalemia: Secondary | ICD-10-CM

## 2016-12-25 DIAGNOSIS — Z8673 Personal history of transient ischemic attack (TIA), and cerebral infarction without residual deficits: Secondary | ICD-10-CM

## 2016-12-25 DIAGNOSIS — I480 Paroxysmal atrial fibrillation: Secondary | ICD-10-CM

## 2016-12-25 DIAGNOSIS — J9611 Chronic respiratory failure with hypoxia: Secondary | ICD-10-CM | POA: Diagnosis not present

## 2016-12-25 DIAGNOSIS — E1142 Type 2 diabetes mellitus with diabetic polyneuropathy: Secondary | ICD-10-CM

## 2016-12-25 DIAGNOSIS — B351 Tinea unguium: Secondary | ICD-10-CM | POA: Diagnosis not present

## 2016-12-25 DIAGNOSIS — I5032 Chronic diastolic (congestive) heart failure: Secondary | ICD-10-CM

## 2016-12-25 DIAGNOSIS — K5900 Constipation, unspecified: Secondary | ICD-10-CM

## 2016-12-25 NOTE — Progress Notes (Signed)
DATE:  12/25/2016   MRN:  [id number]  BIRTHDAY: 1931-01-31  Facility:  Nursing Home Location:  [place] Room Number: 606-P  LEVEL OF CARE:  SNF [phone])  Contact Information    Name Relation Home Work Mobile   T[place] Son [phone]  [phone]   Dede,[name] Relative [phone]         Code Status History    Date Active Date Inactive Code Status Order ID Comments User Context   12/05/2016 10:29 PM 12/15/2016  4:33 PM Full Code [id number]  Norval Morton, MD ED   03/14/2015 11:49 PM 03/17/2015  7:46 PM Full Code [id number]  Jani Gravel, MD Inpatient   01/11/2014  2:49 PM 01/13/2014  4:19 PM DNR [id number]  Annita Brod, MD Inpatient   04/04/2013  5:21 PM 04/08/2013  6:46 PM Full Code [id number]  Orson Eva, MD Inpatient   12/18/2011  1:41 PM 12/22/2011  2:29 PM Full Code [id number]  Courtney Heys, RN Inpatient   12/14/2011  1:41 AM 12/18/2011  1:41 PM Full Code [id number]  Malva Limes, RN Inpatient       Chief Complaint  Patient presents with  . Acute Visit    HISTORY OF PRESENT ILLNESS:  This is an 83-YO male seen for an acute visit.  He was admitted to [facility] on 12/22/2016 for short-term rehabilitation on the advice of his PCP.  He had a recent admission at [facility] 12/05/2016-12/15/2016 for cellulitis lower extremity and renal failure.  Lasix was increased to 40 mg BID. unna wraps and has finished Levaquin course. He had declined admission to SNF following hospitalization and returned home with Home health. He reported worsening erythema and drainage from the legs with worsening dyspnea with exertion, orthopnea and weight gain. PCP increased Lasix to 80 mg Q AM and 40 mg Q PM. He fell @ home and sustained left foot sprain. He has been admitted to [facility] for 24-hour care. He has PMH of hypertension, hyperlipidemia, CVA, CHF, diabetes mellitus type 2 and lower extremity edema.  He was seen in the room with therapist at  bedside. He did not verbalize any concerns.   PAST MEDICAL HISTORY:  Past Medical History:  Diagnosis Date  . Alpha thalassemia ([place])   . BPH (benign prostatic hyperplasia)   . CHF (congestive heart failure) ([place])   . High cholesterol   . Hypertension   . Lower extremity edema   . Onychomycosis   . Prostate cancer ([place])    S/P "8 weeks of radiation"  . Prostatitis    recurrent  . Sleep apnea   . Stroke [facility]) ~ 2005   denies residual on 2/123/2015  . Type II diabetes mellitus ([place])   . Umbilical hernia   . Urinary urgency    with incontinence     CURRENT MEDICATIONS: Reviewed  Patient's Medications  New Prescriptions   No medications on file  Previous Medications   AMITRIPTYLINE (ELAVIL) 25 MG TABLET    Take 25 mg by mouth at bedtime.   ASPIRIN EC 81 MG TABLET    Take 81 mg by mouth daily.   B COMPLEX VITAMINS (VITAMIN B COMPLEX PO)    Take 1 tablet by mouth daily.   BUPROPION (WELLBUTRIN SR) 150 MG 12 HR TABLET    Take 150 mg by mouth daily.   CLOPIDOGREL (PLAVIX) 75 MG TABLET    Take 75 mg by mouth daily.  CLOTRIMAZOLE-BETAMETHASONE (LOTRISONE) LOTION    Apply 1 application topically 2 (two) times daily as needed (antifungal).    FLUTICASONE (FLONASE) 50 MCG/ACT NASAL SPRAY    Place 1 spray into both nostrils daily.   FUROSEMIDE (LASIX) 40 MG TABLET    Take 40-80 mg by mouth daily. Take 80 mg QAM and 40 mg QPM for CHF   GABAPENTIN (NEURONTIN) 300 MG CAPSULE    Take 300 mg by mouth 3 (three) times daily.   GUAIFENESIN (MUCINEX) 600 MG 12 HR TABLET    Take 2 tablets (1,200 mg total) by mouth 2 (two) times daily as needed.   INSULIN GLARGINE (LANTUS) 100 UNIT/ML INJECTION    Inject 0.4 mLs (40 Units total) into the skin 2 (two) times daily.   METOPROLOL TARTRATE (LOPRESSOR) 25 MG TABLET    Take 1 tablet (25 mg total) by mouth 2 (two) times daily.   PANTOPRAZOLE (PROTONIX) 40 MG TABLET    Take 40 mg by mouth daily.   POTASSIUM CHLORIDE SA (K-DUR,KLOR-CON) 20 MEQ TABLET     Take 20 mEq by mouth 2 (two) times daily.    SENNOSIDES-DOCUSATE SODIUM (SENOKOT-S) 8.6-50 MG TABLET    Take 1 tablet by mouth daily as needed.    SIMVASTATIN (ZOCOR) 20 MG TABLET    Take 20 mg by mouth daily.   TERAZOSIN (HYTRIN) 2 MG CAPSULE    Take 4 mg by mouth daily. Take two 2-mg tablets to = 4 mg   TERBINAFINE (LAMISIL) 250 MG TABLET    Take 250 mg by mouth daily. continous   TRAMADOL (ULTRAM) 50 MG TABLET    Take 50 mg by mouth every 6 (six) hours as needed for moderate pain.   VITAMIN B-12 (CYANOCOBALAMIN) 1000 MCG TABLET    Take 1,000 mcg by mouth daily.  Modified Medications   No medications on file  Discontinued Medications   LEVOFLOXACIN (LEVAQUIN) 750 MG TABLET    Take 1 tablet (750 mg total) by mouth daily.     Allergies  Allergen Reactions  . Flomax [Tamsulosin Hcl] Other (See Comments)    Excessive tearing  . Glucophage [Metformin Hcl] Diarrhea     REVIEW OF SYSTEMS:  GENERAL: no change in appetite, no fatigue, no weight changes, no fever, chills or weakness EYES: Denies change in vision, dry eyes, eye pain, itching or discharge EARS: Denies change in hearing, ringing in ears, or earache NOSE: Denies nasal congestion or epistaxis MOUTH and THROAT: Denies oral discomfort, gingival pain or bleeding, pain from teeth or hoarseness   RESPIRATORY: no cough, SOB, DOE, wheezing, hemoptysis CARDIAC: no chest pain, or palpitations GI: no abdominal pain, diarrhea, constipation, heart burn, nausea or vomiting GU: Denies dysuria, frequency, hematuria, incontinence, or discharge PSYCHIATRIC: Denies feeling of depression or anxiety. No report of hallucinations, insomnia, paranoia, or agitation    PHYSICAL EXAMINATION  GENERAL APPEARANCE: Well nourished. In no acute distress. Obese SKIN:  Bilateral lower legs has brown discoloration,  covered with unna wraps, right 5th toe tip has black discoloration HEAD: Normal in size and contour. No evidence of trauma EYES: Lids open  and close normally. No blepharitis, entropion or ectropion. PERRL. Conjunctivae are clear and sclerae are white. Lenses are without opacity EARS: Pinnae are normal. Patient hears normal voice tunes of the examiner MOUTH and THROAT: Lips are without lesions. Oral mucosa is moist and without lesions. Tongue is normal in shape, size, and color and without lesions NECK: supple, trachea midline, no neck masses, no thyroid tenderness,  no thyromegaly LYMPHATICS: no LAN in the neck, no supraclavicular LAN RESPIRATORY: breathing is even & unlabored, BS CTAB CARDIAC: RRR, no murmur,no extra heart sounds, BLE 2+ edema GI: abdomen soft, normal BS, no masses, no tenderness, no hepatomegaly, no splenomegaly EXTREMITIES:  Able to move X 4 extremities, bilateral feet toenails are thick and yellowish PSYCHIATRIC: Alert and oriented X 3. Affect and behavior are appropriate    LABS/RADIOLOGY: Labs reviewed: Basic Metabolic Panel:  Recent Labs  12/12/16 0800 12/13/16 0555 12/15/16 0537  NA 141 143 144  K 4.5 5.0 5.2*  CL 98* 99* 99*  CO2 36* 36* 40*  GLUCOSE 67 73 111*  BUN 34* 34* 40*  CREATININE 1.11 1.30* 1.41*  CALCIUM 9.4 9.6 9.6   Liver Function Tests:  Recent Labs  12/09/16 0528  AST 20  ALT 21  ALKPHOS 68  BILITOT 0.4  PROT 6.7  ALBUMIN 3.0*   CBC:  Recent Labs  12/05/16 1741  12/10/16 0530 12/14/16 0528 12/15/16 0537  WBC 13.5*  < > 9.8 9.2 12.9*  NEUTROABS 10.5*  --   --   --   --   HGB 11.4*  < > 11.6* 13.3 13.3  HCT 36.1*  < > 37.3* 42.6 44.9  MCV 68.8*  < > 69.6* 69.3* 70.9*  PLT 240  < > 287 302 288  < > = values in this interval not displayed.  CBG:  Recent Labs  12/14/16 2151 12/15/16 0726 12/15/16 1205  GLUCAP 219* 105* 154*      Dg Chest 2 View  Result Date: 11/28/2016 CLINICAL DATA:  Right foot swelling for 5 months. Fifth toe is black on the tip. Shortness of breath for months. EXAM: CHEST  2 VIEW COMPARISON:  03/16/2015 FINDINGS: Cardiac  enlargement without vascular congestion or edema. Chronic linear scarring in the left lung base appears similar to prior study. No developing consolidation or airspace disease. No pleural effusions. No pneumothorax. Calcification of the aorta. Degenerative changes in the spine. IMPRESSION: Cardiac enlargement with chronic scarring in the left lung base. No acute changes since prior study. No evidence of acute consolidation. Electronically Signed   By: Lucienne Capers M.D.   On: 11/28/2016 21:25   Ct Chest W Contrast  Result Date: 12/11/2016 CLINICAL DATA:  Shortness of breath and lower extremity edema EXAM: CT CHEST WITH CONTRAST TECHNIQUE: Multidetector CT imaging of the chest was performed during intravenous contrast administration. CONTRAST:  75 mL ISOVUE-300 IOPAMIDOL (ISOVUE-300) INJECTION 61% COMPARISON:  Chest radiograph chair 16 2018 FINDINGS: Cardiovascular: There is no demonstrable thoracic aortic aneurysm or dissection. There is moderate atherosclerotic calcification in the aorta. There are scattered foci of calcification in the proximal great vessels. There are multiple scattered foci of coronary artery calcification. Pericardium is not thickened. Mediastinum/Nodes: Thyroid appears unremarkable. There is fairly extensive mediastinal fat diffusely. There is no evident thoracic adenopathy. There is moderate esophageal wall thickening in the midesophagus. Lungs/Pleura: There is patchy atelectasis in the posterior segment left lower lobe as well as in portions of the inferior lingula and left lower lobe. Similar atelectatic changes noted in the posterior right base. There is a small area of airspace consolidation in the posterior left base. There is no appreciable pleural effusion or pleural thickening. Upper Abdomen: There are small gallstones within the gallbladder. The visualized upper abdominal structures otherwise appear unremarkable. Musculoskeletal: There is degenerative change in the thoracic  spine. There are no blastic or lytic bone lesions. IMPRESSION: Areas of patchy atelectasis, primarily on  the left, with a small area of consolidation posterior left base. No evident adenopathy. Multiple foci of atherosclerotic calcification including multiple foci of coronary artery calcification. Several small gallstones noted in gallbladder. Thickening of the mid esophageal wall may be indicative of chronic reflux type esophagitis. Electronically Signed   By: Lowella Grip III M.D.   On: 12/11/2016 07:09   Dg Chest Port 1 View  Result Date: 12/05/2016 CLINICAL DATA:  Shortness of breath.  Bilateral leg swelling. EXAM: PORTABLE CHEST 1 VIEW COMPARISON:  11/28/2016 FINDINGS: Cardiomegaly with tortuous atherosclerotic thoracic aorta, stable from prior exam. Left lung scarring is again seen. Streaky right infrahilar opacities favoring atelectasis. No evidence pulmonary edema. No large pleural effusion allowing for limited left lung base assessment due to habitus and portable technique. No pneumothorax. IMPRESSION: 1. Stable cardiomegaly and left lung scarring. 2. Right infrahilar atelectasis. 3. Thoracic aortic atherosclerosis. Electronically Signed   By: Jeb Levering M.D.   On: 12/05/2016 22:08   Dg Foot 2 Views Right  Result Date: 11/28/2016 CLINICAL DATA:  83 y/o M; right foot swelling and black tip of fifth toe. EXAM: RIGHT FOOT - 2 VIEW COMPARISON:  None. FINDINGS: Bones are demineralized. No acute fracture or dislocation is identified. There is loss of joint space of the ankle joint and flattening of the talar dome as well as sub talar degenerative changes and loss of height of the talus. Vascular calcifications. Extensive nonspecific soft tissue swelling of the foot and ankle. Lisfranc alignment is maintained. IMPRESSION: Loss of the ankle joint space, flattening of the talar dome, subtalar degenerative changes, and loss of height of the talus probably represents early changes of neuropathic  joint. No acute fracture is identified. Diffuse soft tissue swelling of the foot and ankle. Electronically Signed   By: Kristine Garbe M.D.   On: 11/28/2016 21:26    ASSESSMENT/PLAN:  Physical deconditioning - for rehabilitation, PT and OT, for therapeutic strengthening exercises; fall precautions  Chronic respiratory failure  With hypoxia- continue O2 at 2 L/minute via [place] continuously; schedule for sleep study  Cellulitis of BLE - completed Levaquin course; continue unna wraps; monitor for increase in erythema  Bilateral lower extremity edema - continue Lasix 80 mg by mouth every morning and 40 mg by mouth every PM; bilateral unna wraps  Depression - continue Wellbutrin SR 150 mg 1 tab by mouth daily  History of CVA - continue Plavix 75 mg 1 tab by mouth daily, simvastatin 20 mg 1 tab by mouth daily and aspirin EC 81 mg 1 tab by mouth daily  Allergic rhinitis - continue Flonase 50 g 1 spray into both nostrils daily  BPH -  continue to rise is in 2 mg 2 capsules by mouth daily   Onychomycosis - continue Terbinafine 250 mg 1 tab by mouth daily; check liver panel  GERD - continue pantoprazole 40 mg 1 tab by mouth daily  Diabetes mellitus, type I with neuropathy - continue Lantus 100 units/mL give 40 units subcutaneous twice a day and Neurontin 300 mg 1 capsule by mouth 3 times a day; monitor CBGs  Essential hypertension - continue metoprolol tartrate 25 mg 1 tab by mouth twice a day  Hypokalemia - continue KCl ER 20 meq 1 tab by mouth twice a day  Constipation - continue senna S8.6-50 mg 1 tab by mouth daily when necessary  Chronic kidney disease, stage III - check BMP Lab Results  Component Value Date   CREATININE 1.41 (H) 12/15/2016   Leukocytosis -  completed Levaquin course, no fever; check CBC  Suspected OSA -  Has been witnessed to have periods of apnea while sleeping; schedule for sleep study  Chronic diastolic heart failure - Continue Lasix 80 mg by mouth  every morning and 40 mg by mouth every afternoon; weigh every Mondays-Wednesdays-Fridays  Hyperlipidemia - continue simvastatin 20 mg 1 tab by mouth daily  PAF - rate-controlled; continue metoprolol tartrate 25 mg 1 tab by mouth twice a day; atelectasis was reported to be discontinued, has history of falls     Goals of care:  Short-term rehabilitation   Arbadella Kimbler C. [place] - NP [employer] [phone]

## 2016-12-25 NOTE — Patient Outreach (Signed)
[facility]) Care Management [place] Telephone Outreach, Transition of Care day 11 Care Coordination Outreach  12/25/2016  DEANTE PLUMER Jun 11, 1931 [id number]  Unsuccessful telephone outreach to [facility], daughter-in-law/ caregiver (on [facility] CM written consent), of Jared Hall, 83 y/o male referred to [place] from [facility] telephonic RN CM, who received initial high risk referral from MD office. Patient has history including, but not limited to CHF, DM, HTN, HLD, CVA, CKD, paroxysmal AF. Patient had recent hospital visit January 16-26, 2018 for bilateral LE cellulitis with sepsis, ARF with hypoxia, and pneumonia. Patientwas discharged home from hospital after refusing SNF placement for rehabilitation with (new) home O2 therapy and home health [facility]) services through [place].   Received voicemail from [place] over weekend that patient had been admitted to SNF [facility]) for rehabilatation after recent hospitalization as a result of office visit with PCP on Friday 12/22/16.  In her voice mail message, [name] had asked for call back regarding plan for [facility] CM involvement once patient is discharged from SNF; I left [name] a HIPAA compliant voicemail message morning, informing her that I would place [facility] CSW referral to follow patient while in SNF, also informing that [facility] RN CM would become re-involved in patient's care at the time of his discharge home from SNF.   Referral made today for [facility] CSW to follow while patient is in SNF.  Plan:  [facility] RN CM will follow patient's progress while in SNF through collaboration with [facility] CSW.   [name], RN, BSN, [employer] [facility] Care Management  [phone]

## 2017-01-01 ENCOUNTER — Inpatient Hospital Stay (HOSPITAL_COMMUNITY): Payer: Medicare Other

## 2017-01-01 ENCOUNTER — Emergency Department (HOSPITAL_COMMUNITY): Payer: Medicare Other

## 2017-01-01 ENCOUNTER — Other Ambulatory Visit: Payer: Self-pay | Admitting: *Deleted

## 2017-01-01 ENCOUNTER — Inpatient Hospital Stay (HOSPITAL_COMMUNITY)
Admission: EM | Admit: 2017-01-01 | Discharge: 2017-01-09 | DRG: 064 | Disposition: A | Discharge status: Skilled Nursing Facility | Payer: Medicare Other | Attending: Internal Medicine | Admitting: Internal Medicine

## 2017-01-01 ENCOUNTER — Encounter (HOSPITAL_COMMUNITY): Payer: Self-pay

## 2017-01-01 DIAGNOSIS — E11649 Type 2 diabetes mellitus with hypoglycemia without coma: Secondary | ICD-10-CM | POA: Diagnosis present

## 2017-01-01 DIAGNOSIS — Z6839 Body mass index (BMI) 39.0-39.9, adult: Secondary | ICD-10-CM | POA: Diagnosis not present

## 2017-01-01 DIAGNOSIS — I10 Essential (primary) hypertension: Secondary | ICD-10-CM | POA: Diagnosis not present

## 2017-01-01 DIAGNOSIS — W1830XA Fall on same level, unspecified, initial encounter: Secondary | ICD-10-CM | POA: Diagnosis present

## 2017-01-01 DIAGNOSIS — F329 Major depressive disorder, single episode, unspecified: Secondary | ICD-10-CM | POA: Diagnosis present

## 2017-01-01 DIAGNOSIS — R109 Unspecified abdominal pain: Secondary | ICD-10-CM

## 2017-01-01 DIAGNOSIS — R4182 Altered mental status, unspecified: Secondary | ICD-10-CM

## 2017-01-01 DIAGNOSIS — E1142 Type 2 diabetes mellitus with diabetic polyneuropathy: Secondary | ICD-10-CM

## 2017-01-01 DIAGNOSIS — Z9181 History of falling: Secondary | ICD-10-CM | POA: Diagnosis not present

## 2017-01-01 DIAGNOSIS — I639 Cerebral infarction, unspecified: Secondary | ICD-10-CM | POA: Diagnosis not present

## 2017-01-01 DIAGNOSIS — I872 Venous insufficiency (chronic) (peripheral): Secondary | ICD-10-CM

## 2017-01-01 DIAGNOSIS — L03115 Cellulitis of right lower limb: Secondary | ICD-10-CM | POA: Diagnosis present

## 2017-01-01 DIAGNOSIS — R278 Other lack of coordination: Secondary | ICD-10-CM | POA: Diagnosis not present

## 2017-01-01 DIAGNOSIS — I480 Paroxysmal atrial fibrillation: Secondary | ICD-10-CM | POA: Diagnosis not present

## 2017-01-01 DIAGNOSIS — J9602 Acute respiratory failure with hypercapnia: Secondary | ICD-10-CM | POA: Diagnosis not present

## 2017-01-01 DIAGNOSIS — Z8673 Personal history of transient ischemic attack (TIA), and cerebral infarction without residual deficits: Secondary | ICD-10-CM

## 2017-01-01 DIAGNOSIS — R0989 Other specified symptoms and signs involving the circulatory and respiratory systems: Secondary | ICD-10-CM | POA: Diagnosis not present

## 2017-01-01 DIAGNOSIS — Z7902 Long term (current) use of antithrombotics/antiplatelets: Secondary | ICD-10-CM

## 2017-01-01 DIAGNOSIS — D560 Alpha thalassemia: Secondary | ICD-10-CM | POA: Diagnosis present

## 2017-01-01 DIAGNOSIS — N183 Chronic kidney disease, stage 3 unspecified: Secondary | ICD-10-CM | POA: Diagnosis present

## 2017-01-01 DIAGNOSIS — E785 Hyperlipidemia, unspecified: Secondary | ICD-10-CM | POA: Diagnosis present

## 2017-01-01 DIAGNOSIS — I5032 Chronic diastolic (congestive) heart failure: Secondary | ICD-10-CM | POA: Diagnosis present

## 2017-01-01 DIAGNOSIS — R404 Transient alteration of awareness: Secondary | ICD-10-CM | POA: Diagnosis not present

## 2017-01-01 DIAGNOSIS — Z79899 Other long term (current) drug therapy: Secondary | ICD-10-CM

## 2017-01-01 DIAGNOSIS — E860 Dehydration: Secondary | ICD-10-CM | POA: Diagnosis present

## 2017-01-01 DIAGNOSIS — J9622 Acute and chronic respiratory failure with hypercapnia: Secondary | ICD-10-CM | POA: Diagnosis present

## 2017-01-01 DIAGNOSIS — R471 Dysarthria and anarthria: Secondary | ICD-10-CM | POA: Diagnosis present

## 2017-01-01 DIAGNOSIS — Y92129 Unspecified place in nursing home as the place of occurrence of the external cause: Secondary | ICD-10-CM

## 2017-01-01 DIAGNOSIS — A4902 Methicillin resistant Staphylococcus aureus infection, unspecified site: Secondary | ICD-10-CM | POA: Diagnosis not present

## 2017-01-01 DIAGNOSIS — L03116 Cellulitis of left lower limb: Secondary | ICD-10-CM | POA: Diagnosis present

## 2017-01-01 DIAGNOSIS — R29810 Facial weakness: Secondary | ICD-10-CM | POA: Diagnosis present

## 2017-01-01 DIAGNOSIS — I6789 Other cerebrovascular disease: Secondary | ICD-10-CM | POA: Diagnosis not present

## 2017-01-01 DIAGNOSIS — E1140 Type 2 diabetes mellitus with diabetic neuropathy, unspecified: Secondary | ICD-10-CM | POA: Diagnosis present

## 2017-01-01 DIAGNOSIS — A0472 Enterocolitis due to Clostridium difficile, not specified as recurrent: Secondary | ICD-10-CM | POA: Diagnosis present

## 2017-01-01 DIAGNOSIS — E1051 Type 1 diabetes mellitus with diabetic peripheral angiopathy without gangrene: Secondary | ICD-10-CM | POA: Diagnosis present

## 2017-01-01 DIAGNOSIS — R4781 Slurred speech: Secondary | ICD-10-CM

## 2017-01-01 DIAGNOSIS — K219 Gastro-esophageal reflux disease without esophagitis: Secondary | ICD-10-CM | POA: Diagnosis not present

## 2017-01-01 DIAGNOSIS — Z66 Do not resuscitate: Secondary | ICD-10-CM | POA: Diagnosis present

## 2017-01-01 DIAGNOSIS — R05 Cough: Secondary | ICD-10-CM | POA: Diagnosis not present

## 2017-01-01 DIAGNOSIS — G9340 Encephalopathy, unspecified: Secondary | ICD-10-CM | POA: Diagnosis not present

## 2017-01-01 DIAGNOSIS — E7800 Pure hypercholesterolemia, unspecified: Secondary | ICD-10-CM | POA: Diagnosis present

## 2017-01-01 DIAGNOSIS — R2689 Other abnormalities of gait and mobility: Secondary | ICD-10-CM | POA: Diagnosis not present

## 2017-01-01 DIAGNOSIS — Z923 Personal history of irradiation: Secondary | ICD-10-CM

## 2017-01-01 DIAGNOSIS — R296 Repeated falls: Secondary | ICD-10-CM | POA: Diagnosis present

## 2017-01-01 DIAGNOSIS — N179 Acute kidney failure, unspecified: Secondary | ICD-10-CM | POA: Diagnosis not present

## 2017-01-01 DIAGNOSIS — Z7982 Long term (current) use of aspirin: Secondary | ICD-10-CM

## 2017-01-01 DIAGNOSIS — G9341 Metabolic encephalopathy: Secondary | ICD-10-CM | POA: Diagnosis present

## 2017-01-01 DIAGNOSIS — Z8546 Personal history of malignant neoplasm of prostate: Secondary | ICD-10-CM

## 2017-01-01 DIAGNOSIS — Z7951 Long term (current) use of inhaled steroids: Secondary | ICD-10-CM

## 2017-01-01 DIAGNOSIS — G4733 Obstructive sleep apnea (adult) (pediatric): Secondary | ICD-10-CM | POA: Diagnosis present

## 2017-01-01 DIAGNOSIS — D649 Anemia, unspecified: Secondary | ICD-10-CM | POA: Diagnosis present

## 2017-01-01 DIAGNOSIS — R531 Weakness: Secondary | ICD-10-CM | POA: Diagnosis not present

## 2017-01-01 DIAGNOSIS — Z794 Long term (current) use of insulin: Secondary | ICD-10-CM

## 2017-01-01 DIAGNOSIS — M6281 Muscle weakness (generalized): Secondary | ICD-10-CM | POA: Diagnosis not present

## 2017-01-01 DIAGNOSIS — F32A Depression, unspecified: Secondary | ICD-10-CM | POA: Diagnosis present

## 2017-01-01 DIAGNOSIS — R488 Other symbolic dysfunctions: Secondary | ICD-10-CM | POA: Diagnosis not present

## 2017-01-01 DIAGNOSIS — R319 Hematuria, unspecified: Secondary | ICD-10-CM | POA: Diagnosis not present

## 2017-01-01 DIAGNOSIS — I63311 Cerebral infarction due to thrombosis of right middle cerebral artery: Secondary | ICD-10-CM | POA: Diagnosis not present

## 2017-01-01 DIAGNOSIS — I130 Hypertensive heart and chronic kidney disease with heart failure and stage 1 through stage 4 chronic kidney disease, or unspecified chronic kidney disease: Secondary | ICD-10-CM | POA: Diagnosis present

## 2017-01-01 DIAGNOSIS — R4701 Aphasia: Secondary | ICD-10-CM | POA: Diagnosis present

## 2017-01-01 DIAGNOSIS — L0390 Cellulitis, unspecified: Secondary | ICD-10-CM | POA: Diagnosis present

## 2017-01-01 DIAGNOSIS — I679 Cerebrovascular disease, unspecified: Secondary | ICD-10-CM

## 2017-01-01 DIAGNOSIS — R5383 Other fatigue: Secondary | ICD-10-CM | POA: Diagnosis not present

## 2017-01-01 DIAGNOSIS — R410 Disorientation, unspecified: Secondary | ICD-10-CM

## 2017-01-01 HISTORY — DX: Anemia, unspecified: D64.9

## 2017-01-01 LAB — CBG MONITORING, ED: Glucose-Capillary: 104 mg/dL — ABNORMAL HIGH (ref 65–99)

## 2017-01-01 LAB — CBC WITH DIFFERENTIAL/PLATELET
BASOS PCT: 0 %
Basophils Absolute: 0 10*3/uL (ref 0.0–0.1)
EOS ABS: 0.1 10*3/uL (ref 0.0–0.7)
Eosinophils Relative: 1 %
HEMATOCRIT: 35.6 % — AB (ref 39.0–52.0)
Hemoglobin: 10.6 g/dL — ABNORMAL LOW (ref 13.0–17.0)
LYMPHS PCT: 16 %
Lymphs Abs: 1.5 10*3/uL (ref 0.7–4.0)
MCH: 20.7 pg — ABNORMAL LOW (ref 26.0–34.0)
MCHC: 29.8 g/dL — ABNORMAL LOW (ref 30.0–36.0)
MCV: 69.4 fL — AB (ref 78.0–100.0)
Monocytes Absolute: 1.2 10*3/uL — ABNORMAL HIGH (ref 0.1–1.0)
Monocytes Relative: 13 %
NEUTROS ABS: 6.7 10*3/uL (ref 1.7–7.7)
NEUTROS PCT: 70 %
Platelets: 164 10*3/uL (ref 150–400)
RBC: 5.13 MIL/uL (ref 4.22–5.81)
RDW: 14.7 % (ref 11.5–15.5)
WBC: 9.5 10*3/uL (ref 4.0–10.5)

## 2017-01-01 LAB — GLUCOSE, CAPILLARY
GLUCOSE-CAPILLARY: 144 mg/dL — AB (ref 65–99)
GLUCOSE-CAPILLARY: 101 mg/dL — AB (ref 65–99)
Glucose-Capillary: 60 mg/dL — ABNORMAL LOW (ref 65–99)

## 2017-01-01 LAB — I-STAT CG4 LACTIC ACID, ED: LACTIC ACID, VENOUS: 0.88 mmol/L (ref 0.5–1.9)

## 2017-01-01 LAB — COMPREHENSIVE METABOLIC PANEL
ALK PHOS: 68 U/L (ref 38–126)
ALT: 19 U/L (ref 17–63)
AST: 24 U/L (ref 15–41)
Albumin: 2.9 g/dL — ABNORMAL LOW (ref 3.5–5.0)
Anion gap: 7 (ref 5–15)
BILIRUBIN TOTAL: 0.7 mg/dL (ref 0.3–1.2)
BUN: 18 mg/dL (ref 6–20)
CALCIUM: 9.1 mg/dL (ref 8.9–10.3)
CO2: 37 mmol/L — ABNORMAL HIGH (ref 22–32)
CREATININE: 1.77 mg/dL — AB (ref 0.61–1.24)
Chloride: 97 mmol/L — ABNORMAL LOW (ref 101–111)
GFR, EST AFRICAN AMERICAN: 39 mL/min — AB (ref >60)
GFR, EST NON AFRICAN AMERICAN: 34 mL/min — AB (ref >60)
Glucose, Bld: 110 mg/dL — ABNORMAL HIGH (ref 65–99)
Potassium: 4 mmol/L (ref 3.5–5.1)
Sodium: 141 mmol/L (ref 135–145)
TOTAL PROTEIN: 5.9 g/dL — AB (ref 6.5–8.1)

## 2017-01-01 LAB — URINALYSIS, ROUTINE W REFLEX MICROSCOPIC
BILIRUBIN URINE: NEGATIVE
Glucose, UA: NEGATIVE mg/dL
HGB URINE DIPSTICK: NEGATIVE
KETONES UR: 5 mg/dL — AB
Leukocytes, UA: NEGATIVE
NITRITE: NEGATIVE
PROTEIN: NEGATIVE mg/dL
SPECIFIC GRAVITY, URINE: 1.017 (ref 1.005–1.030)
pH: 5 (ref 5.0–8.0)

## 2017-01-01 LAB — I-STAT TROPONIN, ED: TROPONIN I, POC: 0.01 ng/mL (ref 0.00–0.08)

## 2017-01-01 LAB — BRAIN NATRIURETIC PEPTIDE: B Natriuretic Peptide: 42.2 pg/mL (ref 0.0–100.0)

## 2017-01-01 LAB — AMMONIA: AMMONIA: 19 umol/L (ref 9–35)

## 2017-01-01 LAB — POC OCCULT BLOOD, ED: Fecal Occult Bld: POSITIVE — AB

## 2017-01-01 MED ORDER — VITAMIN B-12 1000 MCG PO TABS
1000.0000 ug | ORAL_TABLET | Freq: Every day | ORAL | Status: DC
  Administered 2017-01-03 – 2017-01-09 (×7): 1000 ug via ORAL
  Filled 2017-01-01 (×7): qty 1

## 2017-01-01 MED ORDER — STROKE: EARLY STAGES OF RECOVERY BOOK
Freq: Once | Status: AC
  Administered 2017-01-01: 22:00:00
  Filled 2017-01-01: qty 1

## 2017-01-01 MED ORDER — ASPIRIN 300 MG RE SUPP
300.0000 mg | Freq: Every day | RECTAL | Status: DC
  Administered 2017-01-01: 300 mg via RECTAL
  Filled 2017-01-01: qty 1

## 2017-01-01 MED ORDER — SENNOSIDES-DOCUSATE SODIUM 8.6-50 MG PO TABS: 1.0000 | ORAL_TABLET | Freq: Every evening | ORAL | Status: DC | PRN

## 2017-01-01 MED ORDER — CLOPIDOGREL BISULFATE 75 MG PO TABS
75.0000 mg | ORAL_TABLET | Freq: Every day | ORAL | Status: DC
  Administered 2017-01-03 – 2017-01-09 (×7): 75 mg via ORAL
  Filled 2017-01-01 (×7): qty 1

## 2017-01-01 MED ORDER — TRAMADOL HCL 50 MG PO TABS: 50.0000 mg | ORAL_TABLET | Freq: Four times a day (QID) | ORAL | Status: DC | PRN

## 2017-01-01 MED ORDER — PANTOPRAZOLE SODIUM 40 MG PO TBEC
40.0000 mg | DELAYED_RELEASE_TABLET | Freq: Every day | ORAL | Status: DC
  Administered 2017-01-03 – 2017-01-09 (×7): 40 mg via ORAL
  Filled 2017-01-01 (×7): qty 1

## 2017-01-01 MED ORDER — SODIUM CHLORIDE 0.9 % IV BOLUS (SEPSIS): 500.0000 mL | Freq: Once | INTRAVENOUS | Status: DC

## 2017-01-01 MED ORDER — LORAZEPAM 2 MG/ML IJ SOLN
1.0000 mg | Freq: Once | INTRAMUSCULAR | Status: AC
  Administered 2017-01-01: 1 mg via INTRAVENOUS
  Filled 2017-01-01: qty 1

## 2017-01-01 MED ORDER — BUPROPION HCL ER (SR) 150 MG PO TB12: 150.0000 mg | ORAL_TABLET | Freq: Every day | ORAL | Status: DC

## 2017-01-01 MED ORDER — ASPIRIN 325 MG PO TABS
325.0000 mg | ORAL_TABLET | Freq: Every day | ORAL | Status: DC
  Administered 2017-01-03 – 2017-01-09 (×7): 325 mg via ORAL
  Filled 2017-01-01 (×8): qty 1

## 2017-01-01 MED ORDER — INSULIN GLARGINE 100 UNIT/ML ~~LOC~~ SOLN
20.0000 [IU] | Freq: Every day | SUBCUTANEOUS | Status: DC
  Filled 2017-01-01: qty 0.2

## 2017-01-01 MED ORDER — SODIUM CHLORIDE 0.9 % IV SOLN
INTRAVENOUS | Status: DC
  Administered 2017-01-01: 22:00:00 via INTRAVENOUS

## 2017-01-01 MED ORDER — ACETAMINOPHEN 160 MG/5ML PO SOLN: 650.0000 mg | ORAL | Status: DC | PRN

## 2017-01-01 MED ORDER — ASPIRIN 300 MG RE SUPP
300.0000 mg | Freq: Every day | RECTAL | Status: DC
  Administered 2017-01-02: 300 mg via RECTAL
  Filled 2017-01-01: qty 1

## 2017-01-01 MED ORDER — HALOPERIDOL LACTATE 5 MG/ML IJ SOLN
2.5000 mg | Freq: Once | INTRAMUSCULAR | Status: AC
  Administered 2017-01-01: 2.5 mg via INTRAVENOUS
  Filled 2017-01-01: qty 1

## 2017-01-01 MED ORDER — FLUTICASONE PROPIONATE 50 MCG/ACT NA SUSP
1.0000 | Freq: Every day | NASAL | Status: DC
  Administered 2017-01-03: 1 via NASAL
  Filled 2017-01-01 (×2): qty 16

## 2017-01-01 MED ORDER — DEXTROSE 50 % IV SOLN
INTRAVENOUS | Status: AC
  Administered 2017-01-01: 50 mL
  Filled 2017-01-01: qty 50

## 2017-01-01 MED ORDER — GABAPENTIN 300 MG PO CAPS: 300.0000 mg | ORAL_CAPSULE | Freq: Three times a day (TID) | ORAL | Status: DC

## 2017-01-01 MED ORDER — SIMVASTATIN 20 MG PO TABS
20.0000 mg | ORAL_TABLET | Freq: Every day | ORAL | Status: DC
  Administered 2017-01-02 – 2017-01-08 (×7): 20 mg via ORAL
  Filled 2017-01-01 (×7): qty 1

## 2017-01-01 MED ORDER — ACETAMINOPHEN 650 MG RE SUPP: 650.0000 mg | RECTAL | Status: DC | PRN

## 2017-01-01 MED ORDER — INSULIN ASPART 100 UNIT/ML ~~LOC~~ SOLN
0.0000 [IU] | Freq: Three times a day (TID) | SUBCUTANEOUS | Status: DC
  Administered 2017-01-03 – 2017-01-04 (×5): 2 [IU] via SUBCUTANEOUS
  Administered 2017-01-04 – 2017-01-05 (×2): 1 [IU] via SUBCUTANEOUS
  Administered 2017-01-05: 2 [IU] via SUBCUTANEOUS
  Administered 2017-01-05: 1 [IU] via SUBCUTANEOUS
  Administered 2017-01-06 – 2017-01-07 (×5): 2 [IU] via SUBCUTANEOUS
  Administered 2017-01-07: 5 [IU] via SUBCUTANEOUS
  Administered 2017-01-08: 7 [IU] via SUBCUTANEOUS
  Administered 2017-01-08: 3 [IU] via SUBCUTANEOUS
  Administered 2017-01-08: 5 [IU] via SUBCUTANEOUS
  Administered 2017-01-09: 3 [IU] via SUBCUTANEOUS
  Administered 2017-01-09: 5 [IU] via SUBCUTANEOUS
  Administered 2017-01-09: 3 [IU] via SUBCUTANEOUS

## 2017-01-01 MED ORDER — ONDANSETRON HCL 4 MG/2ML IJ SOLN: 4.0000 mg | Freq: Four times a day (QID) | INTRAMUSCULAR | Status: DC | PRN

## 2017-01-01 MED ORDER — ACETAMINOPHEN 325 MG PO TABS
650.0000 mg | ORAL_TABLET | ORAL | Status: DC | PRN
  Administered 2017-01-04: 650 mg via ORAL
  Filled 2017-01-01: qty 2

## 2017-01-01 MED ORDER — ENOXAPARIN SODIUM 40 MG/0.4ML ~~LOC~~ SOLN
40.0000 mg | SUBCUTANEOUS | Status: DC
  Administered 2017-01-01 – 2017-01-02 (×2): 40 mg via SUBCUTANEOUS
  Filled 2017-01-01 (×2): qty 0.4

## 2017-01-01 NOTE — ED Provider Notes (Signed)
[place] DEPT Provider Note   CSN: [id number] Arrival date & time: 01/01/17  1416     History   Chief Complaint Chief Complaint  Patient presents with  . Altered Mental Status    HPI Jared Hall is a 83 y.o. male.  HPI   Level V caveat due to altered mental status.  Jared Hall is a 83 y.o. male, with a history of CHF, HTN, DM, and recent cellulitis, presenting to the ED with Progressive altered mental status and slurred speech over the past 4 days. Patient presents from a nursing home, [facility] rehabilitation, where he is receiving care for bilateral lower extremity cellulitis. ED RN spoke with staff from the nursing home. NH nurse states that they noticed the patient began to be more confused on February 9. This morning, patient was noted to be more confused. Patient also fell multiple times this morning. Patient is oriented to self only.    Past Medical History:  Diagnosis Date  . Alpha thalassemia ([place])   . BPH (benign prostatic hyperplasia)   . CHF (congestive heart failure) ([place])   . High cholesterol   . Hypertension   . Lower extremity edema   . Onychomycosis   . Prostate cancer ([place])    S/P "8 weeks of radiation"  . Prostatitis    recurrent  . Sleep apnea   . Stroke [facility]) ~ 2005   denies residual on 2/123/2015  . Type II diabetes mellitus ([place])   . Umbilical hernia   . Urinary urgency    with incontinence    Patient Active Problem List   Diagnosis Date Noted  . CVA (cerebral vascular accident) ([place]) 01/01/2017  . Acute respiratory failure ([place]) 12/15/2016  . Morbid obesity ([place]) 12/15/2016  . Hard of hearing 12/15/2016  . Chronic kidney disease, stage III (moderate) 12/06/2016  . Bell's palsy   . Facial droop   . Paroxysmal atrial fibrillation (HCC)   . Embolic stroke ([place])   . Dysphagia 03/14/2015  . Dysarthria 03/14/2015  . Stroke ([place]) 03/14/2015  . Cellulitis 01/11/2014  . Bilateral lower extremity edema 01/11/2014  . Venous  stasis dermatitis 01/11/2014  . DM type 2 with diabetic peripheral neuropathy ([place]) 01/11/2014  . Cellulitis and abscess of leg 01/11/2014  . Cellulitis and abscess 01/11/2014  . Depression 06/15/2013  . GERD (gastroesophageal reflux disease) 06/15/2013  . Constipation 06/15/2013  . Diabetic neuropathy ([place]) 06/15/2013  . Type 1 diabetes mellitus with peripheral circulatory complications ([place]) 123456  . Essential hypertension, benign 04/07/2013  . AKI (acute kidney injury) ([place]) 04/04/2013  . Cellulitis of lower extremity 04/04/2013  . Right hip pain 04/04/2013  . Renal insufficiency 12/16/2011  . History of CVA (cerebrovascular accident) 12/16/2011  . Dyslipidemia 12/16/2011  . Obesity (BMI 30-39.9) 12/16/2011  . Umbilical hernia with obstruction-partial on CT scan; easily reducible 12/14/2011  . History of PSVT (paroxysmal supraventricular tachycardia) 12/14/2011  . DM (diabetes mellitus) ([place]) 12/14/2011    Past Surgical History:  Procedure Laterality Date  . HERNIA REPAIR    . VASECTOMY    . VENTRAL HERNIA REPAIR  12/18/2011   Procedure: HERNIA REPAIR VENTRAL ADULT;  Surgeon: Adin Hector, MD;  Location: [facility];  Service: General;  Laterality: N/A;       Home Medications    Prior to Admission medications   Medication Sig Start Date End Date Taking? Authorizing Provider  buPROPion (WELLBUTRIN SR) 150 MG 12 hr tablet Take 150 mg by mouth daily.  11/17/16  Yes Historical Provider, MD  amitriptyline (ELAVIL) 25 MG tablet Take 25 mg by mouth at bedtime.    Historical Provider, MD  aspirin EC 81 MG tablet Take 81 mg by mouth daily.    Historical Provider, MD  B Complex Vitamins (VITAMIN B COMPLEX PO) Take 1 tablet by mouth daily.    Historical Provider, MD  clopidogrel (PLAVIX) 75 MG tablet Take 75 mg by mouth daily.     Historical Provider, MD  clotrimazole-betamethasone (LOTRISONE) lotion Apply 1 application topically 2 (two) times daily as needed (antifungal).      Historical Provider, MD  fluticasone (FLONASE) 50 MCG/ACT nasal spray Place 1 spray into both nostrils daily. 03/17/15   Kelvin Cellar, MD  furosemide (LASIX) 40 MG tablet Take 40-80 mg by mouth daily. Take 80 mg QAM and 40 mg QPM for CHF    Historical Provider, MD  gabapentin (NEURONTIN) 300 MG capsule Take 300 mg by mouth 3 (three) times daily.    Historical Provider, MD  guaiFENesin (MUCINEX) 600 MG 12 hr tablet Take 2 tablets (1,200 mg total) by mouth 2 (two) times daily as needed. 12/15/16   Debbe Odea, MD  insulin glargine (LANTUS) 100 UNIT/ML injection Inject 0.4 mLs (40 Units total) into the skin 2 (two) times daily. 12/15/16   Debbe Odea, MD  metoprolol tartrate (LOPRESSOR) 25 MG tablet Take 1 tablet (25 mg total) by mouth 2 (two) times daily. 03/17/15   Kelvin Cellar, MD  pantoprazole (PROTONIX) 40 MG tablet Take 40 mg by mouth daily.    Historical Provider, MD  potassium chloride SA (K-DUR,KLOR-CON) 20 MEQ tablet Take 20 mEq by mouth 2 (two) times daily.     Historical Provider, MD  sennosides-docusate sodium (SENOKOT-S) 8.6-50 MG tablet Take 1 tablet by mouth daily as needed.     Historical Provider, MD  simvastatin (ZOCOR) 20 MG tablet Take 20 mg by mouth daily.    Historical Provider, MD  terazosin (HYTRIN) 2 MG capsule Take 4 mg by mouth daily. Take two 2-mg tablets to = 4 mg    Historical Provider, MD  terbinafine (LAMISIL) 250 MG tablet Take 250 mg by mouth daily. continous 11/08/16   Historical Provider, MD  traMADol (ULTRAM) 50 MG tablet Take 50 mg by mouth every 6 (six) hours as needed for moderate pain.    Historical Provider, MD  vitamin B-12 (CYANOCOBALAMIN) 1000 MCG tablet Take 1,000 mcg by mouth daily.    Historical Provider, MD    Family History Family History  Problem Relation Age of Onset  . Pneumonia Mother   . Heart attack Father     Social History Social History  Substance Use Topics  . Smoking status: Never Smoker  . Smokeless tobacco: Never Used  .  Alcohol use No     Allergies   Flomax [tamsulosin hcl] and Glucophage [metformin hcl]   Review of Systems Review of Systems  Unable to perform ROS: Mental status change     Physical Exam Updated Vital Signs BP (!) 141/54 (BP Location: Left Arm)   Pulse 80   Temp 98.4 F (36.9 C) (Oral)   Resp 16   Ht 5\' 9"  (1.753 m)   Wt 119.7 kg   SpO2 98%   BMI 38.99 kg/m   Physical Exam  Constitutional: He appears well-developed and well-nourished. No distress.  Morbidly obese male  HENT:  Head: Normocephalic and atraumatic.  Eyes: Conjunctivae are normal.  Patient would not follow instructions for EOM testing.  Neck: Neck supple.  Cardiovascular: Normal rate, regular rhythm, normal heart sounds and intact distal pulses.   Pulmonary/Chest: Effort normal and breath sounds normal. No respiratory distress.  Abdominal: Soft. There is no tenderness. There is no guarding.  Musculoskeletal: He exhibits no edema.  Patient indicates no tenderness along the midline spine. No noted deformity, step-off, crepitus, swelling, or other abnormality noted in the spine.  Lymphadenopathy:    He has no cervical adenopathy.  Neurological: He is alert.  Patient is oriented to self. Other questions he gives responses to the answers are inappropriate. He does not know where he or what is going on. He does follow some commands.  Grip strength seems to be weaker on the left. Patient is able to demonstrate bilateral plantar and dorsiflexion at the ankles against resistance. Slurred speech and left-sided facial droop.  Upright ataxia present.   Skin: Skin is warm and dry. He is not diaphoretic.  Compression bandages noted to the bilateral lower extremities made up of rolled gauze and Cob[place]. The outer bandage is labeled with 12/25/16.  Erythema and yellow tinted crusting to the patient's bilateral lower extremities after bandages were removed. No active discharge noted.  Psychiatric: He has a normal mood and  affect. His behavior is normal.  Nursing note and vitals reviewed.    ED Treatments / Results  Labs (all labs ordered are listed, but only abnormal results are displayed) Labs Reviewed  CBC WITH DIFFERENTIAL/PLATELET - Abnormal; Notable for the following:       Result Value   Hemoglobin 10.6 (*)    HCT 35.6 (*)    MCV 69.4 (*)    MCH 20.7 (*)    MCHC 29.8 (*)    Monocytes Absolute 1.2 (*)    All other components within normal limits  COMPREHENSIVE METABOLIC PANEL - Abnormal; Notable for the following:    Chloride 97 (*)    CO2 37 (*)    Glucose, Bld 110 (*)    Creatinine, Ser 1.77 (*)    Total Protein 5.9 (*)    Albumin 2.9 (*)    GFR calc non Af Amer 34 (*)    GFR calc Af Amer 39 (*)    All other components within normal limits  URINALYSIS, ROUTINE W REFLEX MICROSCOPIC - Abnormal; Notable for the following:    Ketones, ur 5 (*)    All other components within normal limits  CBG MONITORING, ED - Abnormal; Notable for the following:    Glucose-Capillary 104 (*)    All other components within normal limits  URINE CULTURE  AMMONIA  BRAIN NATRIURETIC PEPTIDE  I-STAT CG4 LACTIC ACID, ED  CBG MONITORING, ED  I-STAT TROPOININ, ED  POC OCCULT BLOOD, ED   Hemoglobin  Date Value Ref Range Status  01/01/2017 10.6 (L) 13.0 - 17.0 g/dL Final  12/15/2016 13.3 13.0 - 17.0 g/dL Final  12/14/2016 13.3 13.0 - 17.0 g/dL Final  12/10/2016 11.6 (L) 13.0 - 17.0 g/dL Final    EKG  EKG Interpretation  Date/Time:  Monday January 01 2017 16:30:59 EST Ventricular Rate:  88 PR Interval:    QRS Duration: 103 QT Interval:  366 QTC Calculation: 443 R Axis:   15 Text Interpretation:  Sinus rhythm Prolonged PR interval Anteroseptal infarct, age indeterminate No significant change was found Confirmed by CAMPOS  MD, Lennette Bihari ([id number]) on 01/01/2017 5:26:34 PM       Radiology Dg Chest 2 View  Result Date: 01/01/2017 CLINICAL DATA:  Altered mental status EXAM: CHEST  2 VIEW COMPARISON:   12/10/2016 FINDINGS: Cardiomegaly again noted. Stable chronic elevation of left hemidiaphragm and prominent fat pad in left cardiophrenic angle. There is linear atelectasis or infiltrate in lingula. No pulmonary edema. Right lung is clear IMPRESSION: Stable chronic elevation of left hemidiaphragm and prominent fat pad in left cardiophrenic angle. There is linear atelectasis or infiltrate in lingula. No pulmonary edema. Right lung is clear Electronically Signed   By: Lahoma Crocker M.D.   On: 01/01/2017 16:25   Ct Head Wo Contrast  Result Date: 01/01/2017 CLINICAL DATA:  Pt has very altered mental status talking in undistinguished words and wavings arms around EXAM: CT HEAD WITHOUT CONTRAST CT CERVICAL SPINE WITHOUT CONTRAST TECHNIQUE: Multidetector CT imaging of the head and cervical spine was performed following the standard protocol without intravenous contrast. Multiplanar CT image reconstructions of the cervical spine were also generated. COMPARISON:  03/14/2015 and 03/15/2015 FINDINGS: CT HEAD FINDINGS Brain: There is moderate central and cortical atrophy. Periventricular white matter changes are consistent with small vessel disease. Stable appearance of subdural hygroma in the posterior fossa. Remote right cerebellar infarct. There is no intra or extra-axial mass lesion. The basilar cisterns and ventricles have a normal appearance. There is no CT evidence for acute infarction or hemorrhage. Vascular: There is atherosclerotic calcification of the carotid siphons. Skull: No calvarial fracture. Sinuses/Orbits: No acute finding. Other: None CT CERVICAL SPINE FINDINGS Alignment: There is significant degenerative changes in mid and lower cervical spine. Degenerative anterolisthesis of C5 on C6 measures approximately 4 mm. Skull base and vertebrae: No acute fracture. No primary bone lesion or focal pathologic process. Soft tissues and spinal canal: No prevertebral fluid or swelling. No visible canal hematoma. Disc  levels: Significant disc height loss throughout the cervical spine. Bilateral foraminal narrowing at C3-4. Bilateral foraminal narrowing at C4-5. Bilateral foraminal narrowing at C6-7. Significant facet hypertrophy at all levels. Upper chest: Unremarkable. Other: Study quality is degraded by patient motion artifact. IMPRESSION: 1. Atrophy and small vessel disease. 2. 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 3. Remote lacunar infarct of the right cerebellar hemisphere. 4.  No evidence for acute CT abnormality. 5. Significant cervical spine degenerative changes without evidence for acute abnormality. Electronically Signed   By: Nolon Nations M.D.   On: 01/01/2017 16:10   Ct Cervical Spine Wo Contrast  Result Date: 01/01/2017 CLINICAL DATA:  Pt has very altered mental status talking in undistinguished words and wavings arms around EXAM: CT HEAD WITHOUT CONTRAST CT CERVICAL SPINE WITHOUT CONTRAST TECHNIQUE: Multidetector CT imaging of the head and cervical spine was performed following the standard protocol without intravenous contrast. Multiplanar CT image reconstructions of the cervical spine were also generated. COMPARISON:  03/14/2015 and 03/15/2015 FINDINGS: CT HEAD FINDINGS Brain: There is moderate central and cortical atrophy. Periventricular white matter changes are consistent with small vessel disease. Stable appearance of subdural hygroma in the posterior fossa. Remote right cerebellar infarct. There is no intra or extra-axial mass lesion. The basilar cisterns and ventricles have a normal appearance. There is no CT evidence for acute infarction or hemorrhage. Vascular: There is atherosclerotic calcification of the carotid siphons. Skull: No calvarial fracture. Sinuses/Orbits: No acute finding. Other: None CT CERVICAL SPINE FINDINGS Alignment: There is significant degenerative changes in mid and lower cervical spine. Degenerative anterolisthesis of C5 on C6 measures approximately 4 mm. Skull base  and vertebrae: No acute fracture. No primary bone lesion or focal pathologic process. Soft tissues and spinal canal: No prevertebral fluid or swelling. No visible  canal hematoma. Disc levels: Significant disc height loss throughout the cervical spine. Bilateral foraminal narrowing at C3-4. Bilateral foraminal narrowing at C4-5. Bilateral foraminal narrowing at C6-7. Significant facet hypertrophy at all levels. Upper chest: Unremarkable. Other: Study quality is degraded by patient motion artifact. IMPRESSION: 1. Atrophy and small vessel disease. 2. 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 3. Remote lacunar infarct of the right cerebellar hemisphere. 4.  No evidence for acute CT abnormality. 5. Significant cervical spine degenerative changes without evidence for acute abnormality. Electronically Signed   By: Nolon Nations M.D.   On: 01/01/2017 16:10    Procedures Procedures (including critical care time)  Medications Ordered in ED Medications  sodium chloride 0.9 % bolus 500 mL (not administered)     Initial Impression / Assessment and Plan / ED Course  I have reviewed the triage vital signs and the nursing notes.  Pertinent labs & imaging results that were available during my care of the patient were reviewed by me and considered in my medical decision making (see chart for details).     Patient presents with reportedly new neurologic deficits beginning days ago. Suspect subacute stroke. Drop in hemoglobin noted. Hemoccult pending.  5:18 PM Spoke with Dr. Lawana Pai, Neurologist, who agreed with our assessment of subacute stroke. MRI and hospitalist admission. 5:46 PM Spoke with Dr. Grandville Silos, hospitalist, who agreed to admit the patient.   Findings and plan of care discussed with Jola Schmidt, MD.   Vitals:   01/01/17 1418 01/01/17 1422 01/01/17 1633  BP:  (!) 141/54 (!) 154/105  Pulse:  80 91  Resp:  16 (!) 29  Temp:  98.4 F (36.9 C)   TempSrc:  Oral   SpO2: 95% 98% 97%    Weight:  119.7 kg   Height:  5\' 9"  (1.753 m)      Final Clinical Impressions(s) / ED Diagnoses   Final diagnoses:  Altered mental status, unspecified altered mental status type    New Prescriptions New Prescriptions   No medications on file     Layla Maw 01/01/17 [place], MD 01/03/17 0730

## 2017-01-01 NOTE — ED Notes (Signed)
EKG given to Dr. Allen 

## 2017-01-01 NOTE — Patient Outreach (Signed)
[facility]) Care Management  [facility] Social Work  01/01/2017  Jared Hall Dec 19, 1930 [id number]  Subjective:  Patient is a 83 year old white male, currently in rehab at [facility]. Patient only able to state his name and date of birth.  Conversation following was unintelligible.   Objective:   Encounter Medications:  Outpatient Encounter Prescriptions as of 01/01/2017  Medication Sig  . amitriptyline (ELAVIL) 25 MG tablet Take 25 mg by mouth at bedtime.  [name] Kitchen aspirin EC 81 MG tablet Take 81 mg by mouth daily.  . B Complex Vitamins (VITAMIN B COMPLEX PO) Take 1 tablet by mouth daily.  [name] Kitchen buPROPion (WELLBUTRIN SR) 150 MG 12 hr tablet Take 150 mg by mouth daily.  . clopidogrel (PLAVIX) 75 MG tablet Take 75 mg by mouth daily.   . clotrimazole-betamethasone (LOTRISONE) lotion Apply 1 application topically 2 (two) times daily as needed (antifungal).   . fluticasone (FLONASE) 50 MCG/ACT nasal spray Place 1 spray into both nostrils daily.  . furosemide (LASIX) 40 MG tablet Take 40-80 mg by mouth daily. Take 80 mg QAM and 40 mg QPM for CHF  . gabapentin (NEURONTIN) 300 MG capsule Take 300 mg by mouth 3 (three) times daily.  [name] Kitchen guaiFENesin (MUCINEX) 600 MG 12 hr tablet Take 2 tablets (1,200 mg total) by mouth 2 (two) times daily as needed.  . insulin glargine (LANTUS) 100 UNIT/ML injection Inject 0.4 mLs (40 Units total) into the skin 2 (two) times daily.  . metoprolol tartrate (LOPRESSOR) 25 MG tablet Take 1 tablet (25 mg total) by mouth 2 (two) times daily.  . pantoprazole (PROTONIX) 40 MG tablet Take 40 mg by mouth daily.  . potassium chloride SA (K-DUR,KLOR-CON) 20 MEQ tablet Take 20 mEq by mouth 2 (two) times daily.   . sennosides-docusate sodium (SENOKOT-S) 8.6-50 MG tablet Take 1 tablet by mouth daily as needed.   . simvastatin (ZOCOR) 20 MG tablet Take 20 mg by mouth daily.  [name] Kitchen terazosin (HYTRIN) 2 MG capsule Take 4 mg by mouth daily. Take two 2-mg tablets to = 4 mg  .  terbinafine (LAMISIL) 250 MG tablet Take 250 mg by mouth daily. continous  . traMADol (ULTRAM) 50 MG tablet Take 50 mg by mouth every 6 (six) hours as needed for moderate pain.  . vitamin B-12 (CYANOCOBALAMIN) 1000 MCG tablet Take 1,000 mcg by mouth daily.   No facility-administered encounter medications on file as of 01/01/2017.     Functional Status:  In your present state of health, do you have any difficulty performing the following activities: 12/06/2016 11/02/2016  Hearing? [place]? N -  Difficulty concentrating or making decisions? N -  Walking or climbing stairs? Y -  Dressing or bathing? Y -  Doing errands, shopping? Y -  Conservation officer, nature and eating ? - N  Using the Toilet? - N  In the past six months, have you accidently leaked urine? - Y  Do you have problems with loss of bowel control? - N  Managing your Medications? - N  Managing your Finances? - N  Housekeeping or managing your Housekeeping? - Y  Some recent data might be hidden    Fall/Depression Screening:  PHQ 2/9 Scores 11/02/2016 10/19/2016  PHQ - 2 Score 0 0    Assessment: This Education officer, museum spoke to patient's RN Waymond Cera, who confirms that patient has not been himself in the last week( increased confusion). Per patient's RN he has pneumonia in both lungs and is  currently on antibiotics. Patient also had a fall this morning.  She has ordered stat blood work and a repeat chest xray.  Results are pending at this time.  Patient's nurse will contact patient's daughter once the lab results are in to discuss next steps.  Plan:  This Education officer, museum will follow up with patient while in rehab within 2 weeks at [facility].   [name] [facility] Care Management [phone]

## 2017-01-01 NOTE — ED Notes (Signed)
Pts speech seems slurred on this rn assessment, [facility] contacted who states that the patient has been confused with jumbled speech progressively over the weekend

## 2017-01-01 NOTE — Progress Notes (Addendum)
Covering provider paged to request PRN medication needed for MRI due to inability to lie still for scan.  One time dose received for 1mg  IV Ativan.    At approx 2156, pt observed having seizure like activity, jerking/flailing all extremities with distant gaze immediately s/p administration of PRN 1mg  IV Ativan & PRN 19ml amp IV Dextrose 50%.  Agricultural consultant notified.  Rapid response called by Caryl Pina, charge RN @ approx 2200.  Rapid RN to bedside.  Scorr, K., NP notified.  PRN one time order received for 2.5 mg IV Haldol, however, not given initially as pt fell asleep in route to MRI with no jerking movement noted.  Jerking activity lasted approx 25 mins.  Pt transported to MRI via bed accompanied by this RN, as well as Nevin Bloodgood, Therapist, sports.  Awakened upon arrival to MRI; fidgeting/very restless.  2.5mg  IV Haldol administered @ 2234 as directed by provider.  MRI completed at 2300.  Transported back to room.  Pt observed resting with eyes closed in no visible distress upon return.  Nurse report given to oncoming RN at approx 2345 and pt left in her care.

## 2017-01-01 NOTE — Significant Event (Signed)
Rapid Response Event Note  Overview: Time Called: 2200 Arrival Time: 2203 Event Type: Neurologic  Initial Focused Assessment:  Overhead paged RR Team for ? Seizure activity.  Upon arrival to bedside pt flailing Bilateral upper arms and legs in a kicking motion. He  responds to pain and is able to state name and location though speech is garbled which per his nurse Darrick Meigs, RN is his baseline.  Stops flailing when touched and instructed to stop kicking.  Follows commands, PERL.  No incontinence, no blood in mouth, no post ictal state.  Pt had been given Ativan 1 mg IV at 2155 prior to going for MRI when he became agitated.  At 2125 his CBG was 60 and he was given Dextrose 50% 1/2 amp. With follow up CBG 144. VS:  98.4  120/60  18 RR with O2 sats 100% on 2 l [place] HR 88.    Interventions: Chaney Malling, NP notified of pt's activity, Haldol 2.5 mg  IV was ordered but held initially as pt fell asleep enroute to MRI.  Haldol was given at 2234 when pt awoke at MRI and was becoming agitated.  MRI completed at 2300  and pt was transported back to [facility] 16.  Hand off report given to [place], RN  Plan of Care (if not transferred):  Add Ativan to pt's list of allergies.  Monitor neuro status hourly. Check CBG q 2 hrs till stablized.   Call for neuro status changes or any other concerns  Event Summary: Name of Physician Notified: Chaney Malling, NP at 2200    at    Outcome: Stayed in room and stabalized     Karinda Cabriales, Gust [place]oms

## 2017-01-01 NOTE — ED Triage Notes (Signed)
Pt presents to the er with [facility] from [facility] for altered mental status and multiple falls since 0600, he is at the rehab center for cellutlitis of both legs, patient is alert to self, denies any pain.  His blood pressure on ems arrival was 90/50 so they administered 250 ml of ns

## 2017-01-01 NOTE — H&P (Signed)
History and Physical    VARISH Hall [id number] DOB: [birth year] DOA: 01/01/2017  PCP: Henrine Screws, MD  Patient coming from: [place] skilled nursing facility  Chief Complaint: Confusion/incomprehensible speech  HPI: DONTAE Hall is a 83 y.o. male with medical history significant of hypertension, hyperlipidemia, chronic kidney disease stage III, prior CVA, CHF with last EF of 55% April 2016, diabetes mellitus, hyperlipidemia who was recently hospitalized from 12/05/2016- 12/15/2016 for bilateral lower extremity cellulitis and edema, acute hypoxic respiratory failure, who presents to the ED from nursing facility at [facility] with a four-day history of slurred speech, progressive confusion. Patient with slurred dysarthric speech with an expressive aphasia and a such history obtained from ED PA and from the chart. Per chart in ED PA it was noted that patient had had a four-day history of altered mental status with slurred speech and also noted to have a left facial droop. ED RN spoke with staff at the nursing home it was noticed that patient began to be more confused on 12/29/2016. The morning of admission patient was noted to be more confused and had multiple falls and only oriented to self. Patient subsequently sent to the ED.   ED Course: Patient seen in the ED, chest x-ray obtained was negative for any acute infiltrate. Urinalysis was unremarkable. BNP was 42.2. Point-of-care troponin was negative. Lactic acid level was 0.88. Comprehensive metabolic profile with a chloride of 97 bicarbonate 37 glucose of 110 creatinine of 1.77 albumin of 2.9 protein of 5.9 otherwise was within normal limits. CBC had a hemoglobin of 10.6 otherwise was within normal limits. CT head and C-spine with atrophy and small vessel disease, stable subdural hygroma of the posterior fossa, remote lacunar infarct of the right cerebellar hemisphere, no acute abnormalities. EKG with a normal sinus rhythm. ED PA spoke with  neurologist was consistent about a subacute CVA and a such dried hospice will call to admit the patient.  Review of Systems: As per HPI otherwise 10 point review of systems negative Unable to review as patient with a expressive aphasia and dysarthric speech.  Past Medical History:  Diagnosis Date  . Alpha thalassemia ([place])   . Anemia 01/01/2017  . BPH (benign prostatic hyperplasia)   . CHF (congestive heart failure) ([place])   . High cholesterol   . Hypertension   . Lower extremity edema   . Onychomycosis   . Prostate cancer ([place])    S/P "8 weeks of radiation"  . Prostatitis    recurrent  . Sleep apnea   . Stroke [facility]) ~ 2005   denies residual on 2/123/2015  . Type II diabetes mellitus ([place])   . Umbilical hernia   . Urinary urgency    with incontinence    Past Surgical History:  Procedure Laterality Date  . HERNIA REPAIR    . VASECTOMY    . VENTRAL HERNIA REPAIR  12/18/2011   Procedure: HERNIA REPAIR VENTRAL ADULT;  Surgeon: Adin Hector, MD;  Location: [place];  Service: General;  Laterality: N/A;     reports that he has never smoked. He has never used smokeless tobacco. He reports that he does not drink alcohol or use drugs.  Allergies  Allergen Reactions  . Flomax [Tamsulosin Hcl] Other (See Comments)    Excessive tearing  . Glucophage [Metformin Hcl] Diarrhea    Family History  Problem Relation Age of Onset  . Pneumonia Mother   . Heart attack Father    Unable to assess due to patient's  current mental status with dysarthric/incomprehensible speech.  Prior to Admission medications   Medication Sig Start Date End Date Taking? Authorizing Provider  amitriptyline (ELAVIL) 25 MG tablet Take 25 mg by mouth at bedtime.   Yes Historical Provider, MD  aspirin EC 81 MG tablet Take 81 mg by mouth daily.   Yes Historical Provider, MD  B Complex Vitamins (VITAMIN B COMPLEX PO) Take 1 tablet by mouth daily.   Yes Historical Provider, MD  buPROPion (WELLBUTRIN SR) 150 MG 12  hr tablet Take 150 mg by mouth daily. 11/17/16  Yes Historical Provider, MD  clopidogrel (PLAVIX) 75 MG tablet Take 75 mg by mouth daily.    Yes Historical Provider, MD  clotrimazole-betamethasone (LOTRISONE) lotion Apply 1 application topically 2 (two) times daily as needed (antifungal).    Yes Historical Provider, MD  fluticasone (FLONASE) 50 MCG/ACT nasal spray Place 1 spray into both nostrils daily. 03/17/15  Yes Kelvin Cellar, MD  furosemide (LASIX) 40 MG tablet Take 40-80 mg by mouth daily. Take 80 mg QAM and 40 mg QPM for CHF   Yes Historical Provider, MD  gabapentin (NEURONTIN) 300 MG capsule Take 300 mg by mouth 3 (three) times daily.   Yes Historical Provider, MD  guaiFENesin (MUCINEX) 600 MG 12 hr tablet Take 2 tablets (1,200 mg total) by mouth 2 (two) times daily as needed. Patient taking differently: Take 600 mg by mouth 2 (two) times daily as needed. For seven days 12/15/16  Yes Debbe Odea, MD  insulin glargine (LANTUS) 100 UNIT/ML injection Inject 0.4 mLs (40 Units total) into the skin 2 (two) times daily. 12/15/16  Yes Debbe Odea, MD  metoprolol tartrate (LOPRESSOR) 25 MG tablet Take 1 tablet (25 mg total) by mouth 2 (two) times daily. 03/17/15  Yes Kelvin Cellar, MD  moxifloxacin (AVELOX) 400 MG tablet Take 400 mg by mouth daily at 8 pm. For seven days 12/28/16 01/04/17 Yes Historical Provider, MD  pantoprazole (PROTONIX) 40 MG tablet Take 40 mg by mouth daily.   Yes Historical Provider, MD  potassium chloride SA (K-DUR,KLOR-CON) 20 MEQ tablet Take 20 mEq by mouth 2 (two) times daily.    Yes Historical Provider, MD  Probiotic Product (PROBIOTIC PO) Take 1 capsule by mouth 2 (two) times daily. For seven days 12/28/16 01/04/17 Yes Historical Provider, MD  sennosides-docusate sodium (SENOKOT-S) 8.6-50 MG tablet Take 1 tablet by mouth daily as needed.    Yes Historical Provider, MD  simvastatin (ZOCOR) 20 MG tablet Take 20 mg by mouth daily.   Yes Historical Provider, MD  terazosin  (HYTRIN) 2 MG capsule Take 4 mg by mouth daily. Take two 2-mg tablets to = 4 mg   Yes Historical Provider, MD  terbinafine (LAMISIL) 250 MG tablet Take 250 mg by mouth daily. continous 11/08/16  Yes Historical Provider, MD  traMADol (ULTRAM) 50 MG tablet Take 50 mg by mouth every 6 (six) hours as needed for moderate pain.   Yes Historical Provider, MD  vitamin B-12 (CYANOCOBALAMIN) 1000 MCG tablet Take 1,000 mcg by mouth daily.   Yes Historical Provider, MD    Physical Exam: Vitals:   01/01/17 1418 01/01/17 1422 01/01/17 1633 01/01/17 1809  BP:  (!) 141/54 (!) 154/105 118/65  Pulse:  80 91 88  Resp:  16 (!) 29 22  Temp:  98.4 F (36.9 C)    TempSrc:  Oral    SpO2: 95% 98% 97% 96%  Weight:  119.7 kg (264 lb)    Height:  5\' 9"  (1.753  m)        Constitutional: Lane on gurney restless trying to pull at the IV pole and trying to get out of bed. Patient with dysarthric speech. Vitals:   01/01/17 1418 01/01/17 1422 01/01/17 1633 01/01/17 1809  BP:  (!) 141/54 (!) 154/105 118/65  Pulse:  80 91 88  Resp:  16 (!) 29 22  Temp:  98.4 F (36.9 C)    TempSrc:  Oral    SpO2: 95% 98% 97% 96%  Weight:  119.7 kg (264 lb)    Height:  5\' 9"  (1.753 m)     Eyes: PERRLA, lids and conjunctivae normal ENMT: Mucous membranes are dry. Posterior pharynx clear of any exudate or lesions.Normal dentition.  Neck: normal, supple, no masses, no thyromegaly Respiratory: clear to auscultation bilaterally anterior lung fields, no wheezing, no crackles. Normal respiratory effort. No accessory muscle use.  Cardiovascular: Regular rate and rhythm, no murmurs / rubs / gallops. No extremity edema. 2+ pedal pulses. No carotid bruits.  Abdomen: no tenderness, no masses palpated. No hepatosplenomegaly. Bowel sounds positive.  Musculoskeletal: no clubbing / cyanosis. No joint deformity upper and lower extremities. Good ROM, no contractures. Normal muscle tone.  Skin: Chronic venous stasis changes noted on bilateral  lower extremity. Scan is noted on bilateral lower extremity. Slight erythema. No warmth. Nontender to palpation.  Neurologic: CN 2-12 grossly intact. Sensation intact, DTR normal. Strength 5/5 in all 4. Unable to perform the rest of the neurological exam due to patient's mental status. Patient following commands although has an expressive aphasia. Patient with dysarthric speech. Patient with incomprehensible speech.  Psychiatric: Normal judgment and insight. Alert and oriented x 3. Normal mood.    Labs on Admission: I have personally reviewed following labs and imaging studies  CBC:  Recent Labs Lab 01/01/17 1507  WBC 9.5  NEUTROABS 6.7  HGB 10.6*  HCT 35.6*  MCV 69.4*  PLT 123456   Basic Metabolic Panel:  Recent Labs Lab 01/01/17 1507  NA 141  K 4.0  CL 97*  CO2 37*  GLUCOSE 110*  BUN 18  CREATININE 1.77*  CALCIUM 9.1   GFR: Estimated Creatinine Clearance: 40.4 mL/min (by C-G formula based on SCr of 1.77 mg/dL (H)). Liver Function Tests:  Recent Labs Lab 01/01/17 1507  AST 24  ALT 19  ALKPHOS 68  BILITOT 0.7  PROT 5.9*  ALBUMIN 2.9*   No results for input(s): LIPASE, AMYLASE in the last 168 hours.  Recent Labs Lab 01/01/17 1507  AMMONIA 19   Coagulation Profile: No results for input(s): INR, PROTIME in the last 168 hours. Cardiac Enzymes: No results for input(s): CKTOTAL, CKMB, CKMBINDEX, TROPONINI in the last 168 hours. BNP (last 3 results) No results for input(s): PROBNP in the last 8760 hours. HbA1C: No results for input(s): HGBA1C in the last 72 hours. CBG:  Recent Labs Lab 01/01/17 1629  GLUCAP 104*   Lipid Profile: No results for input(s): CHOL, HDL, LDLCALC, TRIG, CHOLHDL, LDLDIRECT in the last 72 hours. Thyroid Function Tests: No results for input(s): TSH, T4TOTAL, FREET4, T3FREE, THYROIDAB in the last 72 hours. Anemia Panel: No results for input(s): VITAMINB12, FOLATE, FERRITIN, TIBC, IRON, RETICCTPCT in the last 72 hours. Urine  analysis:    Component Value Date/Time   COLORURINE YELLOW 01/01/2017 [place] 01/01/2017 1723   LABSPEC 1.017 01/01/2017 1723   PHURINE 5.0 01/01/2017 1723   GLUCOSEU NEGATIVE 01/01/2017 1723   HGBUR NEGATIVE 01/01/2017 1723   BILIRUBINUR NEGATIVE 01/01/2017 1723  KETONESUR 5 (A) 01/01/2017 1723   PROTEINUR NEGATIVE 01/01/2017 1723   UROBILINOGEN 1.0 12/06/2014 1414   NITRITE NEGATIVE 01/01/2017 1723   LEUKOCYTESUR NEGATIVE 01/01/2017 1723   Sepsis Labs: !!!!!!!!!!!!!!!!!!!!!!!!!!!!!!!!!!!!!!!!!!!! @LABRCNTIP (procalcitonin:4,lacticidven:4) )No results found for this or any previous visit (from the past 240 hour(s)).   Radiological Exams on Admission: Dg Chest 2 View  Result Date: 01/01/2017 CLINICAL DATA:  Altered mental status EXAM: CHEST  2 VIEW COMPARISON:  12/10/2016 FINDINGS: Cardiomegaly again noted. Stable chronic elevation of left hemidiaphragm and prominent fat pad in left cardiophrenic angle. There is linear atelectasis or infiltrate in lingula. No pulmonary edema. Right lung is clear IMPRESSION: Stable chronic elevation of left hemidiaphragm and prominent fat pad in left cardiophrenic angle. There is linear atelectasis or infiltrate in lingula. No pulmonary edema. Right lung is clear Electronically Signed   By: Lahoma Crocker M.D.   On: 01/01/2017 16:25   Ct Head Wo Contrast  Result Date: 01/01/2017 CLINICAL DATA:  Pt has very altered mental status talking in undistinguished words and wavings arms around EXAM: CT HEAD WITHOUT CONTRAST CT CERVICAL SPINE WITHOUT CONTRAST TECHNIQUE: Multidetector CT imaging of the head and cervical spine was performed following the standard protocol without intravenous contrast. Multiplanar CT image reconstructions of the cervical spine were also generated. COMPARISON:  03/14/2015 and 03/15/2015 FINDINGS: CT HEAD FINDINGS Brain: There is moderate central and cortical atrophy. Periventricular white matter changes are consistent with  small vessel disease. Stable appearance of subdural hygroma in the posterior fossa. Remote right cerebellar infarct. There is no intra or extra-axial mass lesion. The basilar cisterns and ventricles have a normal appearance. There is no CT evidence for acute infarction or hemorrhage. Vascular: There is atherosclerotic calcification of the carotid siphons. Skull: No calvarial fracture. Sinuses/Orbits: No acute finding. Other: None CT CERVICAL SPINE FINDINGS Alignment: There is significant degenerative changes in mid and lower cervical spine. Degenerative anterolisthesis of C5 on C6 measures approximately 4 mm. Skull base and vertebrae: No acute fracture. No primary bone lesion or focal pathologic process. Soft tissues and spinal canal: No prevertebral fluid or swelling. No visible canal hematoma. Disc levels: Significant disc height loss throughout the cervical spine. Bilateral foraminal narrowing at C3-4. Bilateral foraminal narrowing at C4-5. Bilateral foraminal narrowing at C6-7. Significant facet hypertrophy at all levels. Upper chest: Unremarkable. Other: Study quality is degraded by patient motion artifact. IMPRESSION: 1. Atrophy and small vessel disease. 2. 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 3. Remote lacunar infarct of the right cerebellar hemisphere. 4.  No evidence for acute CT abnormality. 5. Significant cervical spine degenerative changes without evidence for acute abnormality. Electronically Signed   By: Nolon Nations M.D.   On: 01/01/2017 16:10   Ct Cervical Spine Wo Contrast  Result Date: 01/01/2017 CLINICAL DATA:  Pt has very altered mental status talking in undistinguished words and wavings arms around EXAM: CT HEAD WITHOUT CONTRAST CT CERVICAL SPINE WITHOUT CONTRAST TECHNIQUE: Multidetector CT imaging of the head and cervical spine was performed following the standard protocol without intravenous contrast. Multiplanar CT image reconstructions of the cervical spine were also  generated. COMPARISON:  03/14/2015 and 03/15/2015 FINDINGS: CT HEAD FINDINGS Brain: There is moderate central and cortical atrophy. Periventricular white matter changes are consistent with small vessel disease. Stable appearance of subdural hygroma in the posterior fossa. Remote right cerebellar infarct. There is no intra or extra-axial mass lesion. The basilar cisterns and ventricles have a normal appearance. There is no CT evidence for acute infarction or hemorrhage. Vascular:  There is atherosclerotic calcification of the carotid siphons. Skull: No calvarial fracture. Sinuses/Orbits: No acute finding. Other: None CT CERVICAL SPINE FINDINGS Alignment: There is significant degenerative changes in mid and lower cervical spine. Degenerative anterolisthesis of C5 on C6 measures approximately 4 mm. Skull base and vertebrae: No acute fracture. No primary bone lesion or focal pathologic process. Soft tissues and spinal canal: No prevertebral fluid or swelling. No visible canal hematoma. Disc levels: Significant disc height loss throughout the cervical spine. Bilateral foraminal narrowing at C3-4. Bilateral foraminal narrowing at C4-5. Bilateral foraminal narrowing at C6-7. Significant facet hypertrophy at all levels. Upper chest: Unremarkable. Other: Study quality is degraded by patient motion artifact. IMPRESSION: 1. Atrophy and small vessel disease. 2. 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 3. Remote lacunar infarct of the right cerebellar hemisphere. 4.  No evidence for acute CT abnormality. 5. Significant cervical spine degenerative changes without evidence for acute abnormality. Electronically Signed   By: Nolon Nations M.D.   On: 01/01/2017 16:10    EKG: Independently reviewed. Normal sinus rhythm. Q waves in lead 3. Poor progression of R wave.  Assessment/Plan Principal Problem:   CVA (cerebral vascular accident) ([place]) Active Problems:   History of CVA (cerebrovascular accident)    Dyslipidemia   AKI (acute kidney injury) ([place])   Essential hypertension, benign   Type 1 diabetes mellitus with peripheral circulatory complications (HCC)   Depression   GERD (gastroesophageal reflux disease)   Diabetic neuropathy (HCC)   Venous stasis dermatitis   DM type 2 with diabetic peripheral neuropathy (HCC)   Paroxysmal atrial fibrillation (HCC)   Chronic kidney disease, stage III (moderate)   Morbid obesity ([place])   Dehydration   Anemia   #1 Probable acute CVA Patient is presented with a four-day history of worsening confusion, dysarthric speech, and comprehensible speech, expressive aphasia, several left-sided weakness and left facial droop. CT head obtained negative for any acute abnormalities. Will admit patient to telemetry. Stroke workup underway including MRI of brain, MRA of brain, 2-D echo, carotid Dopplers, fasting lipid panel, hemoglobin A1c, carotid Dopplers. PT/OT/ST. Give a dose of aspirin. Resume home regimen of aspirin and Plavix. Hold antihypertensive medications. Permissive hypertension. Neurology has been consulted by ED PA and will be assesses. Follow.  #2 acute kidney injury on chronic kidney disease stage III  likely secondary to prerenal azotemia in the setting of diuretics. Check a fractional excretion of sodium. Place on gentle hydration with IV fluids. If no significant improvement will need to follow-up with her nephrologist.  #3 type 2 diabetes with diabetic peripheral neuropathy Patient currently nothing by mouth. Check a hemoglobin A1c. Will place on decreased dose of Lantus 20 units daily and monitored. Sliding scale insulin.  #4 dehydration   IV fluids.  #5 anemia Likely chronic in nature. Check an anemia panel. Follow H&H. Transfusion threshold hemoglobin less than 7.  #6 morbid obesity  #7 hypertension Permissive hypertension. Continue to hold antihypertensive medications.  #8 history of PSVT/paroxysmal A. fib Patient currently rate  controlled. Patient used to be on eliquis which was reportedly discontinued and patient on Plavix.  #9 hyperlipidemia Check a fasting lipid panel. Continue statin.   DVT prophylaxis: Lovenox Code Status: Full Family Communication: no family at bedside. Disposition Plan: Likely back to SNF once workup completed and per neurology. Consults called: Neurology per ED PA. Admission status: Admit to inpatient/telemetry   [facility] MD [facility] Pager [phone]  If 7PM-7AM, please contact night-coverage [email or web address] Password [facility]  01/01/2017,  6:50 PM

## 2017-01-02 ENCOUNTER — Inpatient Hospital Stay (HOSPITAL_COMMUNITY): Payer: Medicare Other

## 2017-01-02 ENCOUNTER — Other Ambulatory Visit (HOSPITAL_COMMUNITY): Payer: Medicare Other

## 2017-01-02 DIAGNOSIS — I639 Cerebral infarction, unspecified: Secondary | ICD-10-CM

## 2017-01-02 DIAGNOSIS — N179 Acute kidney failure, unspecified: Secondary | ICD-10-CM

## 2017-01-02 DIAGNOSIS — J9602 Acute respiratory failure with hypercapnia: Secondary | ICD-10-CM

## 2017-01-02 DIAGNOSIS — Z8673 Personal history of transient ischemic attack (TIA), and cerebral infarction without residual deficits: Secondary | ICD-10-CM

## 2017-01-02 DIAGNOSIS — N183 Chronic kidney disease, stage 3 (moderate): Secondary | ICD-10-CM

## 2017-01-02 DIAGNOSIS — R4182 Altered mental status, unspecified: Secondary | ICD-10-CM

## 2017-01-02 DIAGNOSIS — I10 Essential (primary) hypertension: Secondary | ICD-10-CM

## 2017-01-02 LAB — BASIC METABOLIC PANEL WITH GFR
Anion gap: 10 (ref 5–15)
BUN: 15 mg/dL (ref 6–20)
CO2: 35 mmol/L — ABNORMAL HIGH (ref 22–32)
Calcium: 8.8 mg/dL — ABNORMAL LOW (ref 8.9–10.3)
Chloride: 98 mmol/L — ABNORMAL LOW (ref 101–111)
Creatinine, Ser: 1.45 mg/dL — ABNORMAL HIGH (ref 0.61–1.24)
GFR calc Af Amer: 50 mL/min — ABNORMAL LOW
GFR calc non Af Amer: 43 mL/min — ABNORMAL LOW
Glucose, Bld: 84 mg/dL (ref 65–99)
Potassium: 3.5 mmol/L (ref 3.5–5.1)
Sodium: 143 mmol/L (ref 135–145)

## 2017-01-02 LAB — BLOOD GAS, ARTERIAL
Acid-Base Excess: 10.8 mmol/L — ABNORMAL HIGH (ref 0.0–2.0)
BICARBONATE: 36.6 mmol/L — AB (ref 20.0–28.0)
Drawn by: 257881
O2 CONTENT: 2 L/min
O2 SAT: 96.3 %
PATIENT TEMPERATURE: 98.6
pCO2 arterial: 68.4 mmHg (ref 32.0–48.0)
pH, Arterial: 7.349 — ABNORMAL LOW (ref 7.350–7.450)
pO2, Arterial: 93.9 mmHg (ref 83.0–108.0)

## 2017-01-02 LAB — GLUCOSE, CAPILLARY
GLUCOSE-CAPILLARY: 86 mg/dL (ref 65–99)
GLUCOSE-CAPILLARY: 100 mg/dL — AB (ref 65–99)
Glucose-Capillary: 101 mg/dL — ABNORMAL HIGH (ref 65–99)
Glucose-Capillary: 104 mg/dL — ABNORMAL HIGH (ref 65–99)
Glucose-Capillary: 105 mg/dL — ABNORMAL HIGH (ref 65–99)
Glucose-Capillary: 79 mg/dL (ref 65–99)
Glucose-Capillary: 94 mg/dL (ref 65–99)

## 2017-01-02 LAB — CBC
HCT: 34.9 % — ABNORMAL LOW (ref 39.0–52.0)
Hemoglobin: 10.3 g/dL — ABNORMAL LOW (ref 13.0–17.0)
MCH: 20.7 pg — ABNORMAL LOW (ref 26.0–34.0)
MCHC: 29.5 g/dL — ABNORMAL LOW (ref 30.0–36.0)
MCV: 70.2 fL — ABNORMAL LOW (ref 78.0–100.0)
Platelets: 151 10*3/uL (ref 150–400)
RBC: 4.97 MIL/uL (ref 4.22–5.81)
RDW: 14.9 % (ref 11.5–15.5)
WBC: 8.3 10*3/uL (ref 4.0–10.5)

## 2017-01-02 LAB — LIPID PANEL
Cholesterol: 130 mg/dL (ref 0–200)
HDL: 28 mg/dL — ABNORMAL LOW
LDL Cholesterol: 76 mg/dL (ref 0–99)
Total CHOL/HDL Ratio: 4.6 ratio
Triglycerides: 130 mg/dL
VLDL: 26 mg/dL (ref 0–40)

## 2017-01-02 LAB — IRON AND TIBC
Iron: 47 ug/dL (ref 45–182)
Saturation Ratios: 19 % (ref 17.9–39.5)
TIBC: 248 ug/dL — ABNORMAL LOW (ref 250–450)
UIBC: 201 ug/dL

## 2017-01-02 LAB — SODIUM, URINE, RANDOM: Sodium, Ur: 37 mmol/L

## 2017-01-02 LAB — MRSA PCR SCREENING: MRSA by PCR: POSITIVE — AB

## 2017-01-02 LAB — VITAMIN B12: Vitamin B-12: 1000 pg/mL — ABNORMAL HIGH (ref 180–914)

## 2017-01-02 LAB — CREATININE, URINE, RANDOM: Creatinine, Urine: 188.11 mg/dL

## 2017-01-02 LAB — FERRITIN: Ferritin: 108 ng/mL (ref 24–336)

## 2017-01-02 LAB — FOLATE: Folate: 18.7 ng/mL

## 2017-01-02 MED ORDER — DOXYCYCLINE HYCLATE 100 MG PO TABS
100.0000 mg | ORAL_TABLET | Freq: Two times a day (BID) | ORAL | Status: DC
  Administered 2017-01-02 – 2017-01-04 (×4): 100 mg via ORAL
  Filled 2017-01-02 (×4): qty 1

## 2017-01-02 MED ORDER — MUPIROCIN 2 % EX OINT
1.0000 "application " | TOPICAL_OINTMENT | Freq: Two times a day (BID) | CUTANEOUS | Status: AC
  Administered 2017-01-02 – 2017-01-07 (×10): 1 via NASAL
  Filled 2017-01-02 (×3): qty 22

## 2017-01-02 MED ORDER — DEXTROSE-NACL 5-0.9 % IV SOLN
INTRAVENOUS | Status: DC
  Administered 2017-01-02: 03:00:00 via INTRAVENOUS

## 2017-01-02 MED ORDER — KETOROLAC TROMETHAMINE 15 MG/ML IJ SOLN: 15.0000 mg | Freq: Once | INTRAMUSCULAR | Status: AC

## 2017-01-02 MED ORDER — CHLORHEXIDINE GLUCONATE CLOTH 2 % EX PADS
6.0000 | MEDICATED_PAD | Freq: Every day | CUTANEOUS | Status: DC
  Administered 2017-01-04 – 2017-01-07 (×4): 6 via TOPICAL

## 2017-01-02 NOTE — Evaluation (Signed)
Clinical/Bedside Swallow Evaluation Patient Details  Name: Jared Hall MRN: [id number] Date of Birth: 03/02/1931  Today's Date: 01/02/2017 Time: SLP Start Time (ACUTE ONLY): 1049 SLP Stop Time (ACUTE ONLY): 1100 SLP Time Calculation (min) (ACUTE ONLY): 11 min  Past Medical History:  Past Medical History:  Diagnosis Date  . Alpha thalassemia ([place])   . Anemia 01/01/2017  . BPH (benign prostatic hyperplasia)   . CHF (congestive heart failure) ([place])   . High cholesterol   . Hypertension   . Lower extremity edema   . Onychomycosis   . Prostate cancer ([place])    S/P "8 weeks of radiation"  . Prostatitis    recurrent  . Sleep apnea   . Stroke [facility]) ~ 2005   denies residual on 2/123/2015  . Type II diabetes mellitus ([place])   . Umbilical hernia   . Urinary urgency    with incontinence   Past Surgical History:  Past Surgical History:  Procedure Laterality Date  . ADENOIDECTOMY    . HERNIA REPAIR    . MASTOIDECTOMY    . TONSILLECTOMY    . VASECTOMY    . VENTRAL HERNIA REPAIR  12/18/2011   Procedure: HERNIA REPAIR VENTRAL ADULT;  Surgeon: Adin Hector, MD;  Location: [place];  Service: General;  Laterality: N/A;   HPI:  83 y.o.malewith medical history significant of hypertension, hyperlipidemia, chronic kidney disease stage III, prior CVA, CHF with last EF of 55% April 2016, diabetes mellitus, hyperlipidemia who was recently hospitalized from 12/05/2016- 12/15/2016 for bilateral lower extremity cellulitis and edema, acute hypoxic respiratory failure, who presents to the ED from nursing facility at [facility] with a four-day history of slurred speech, progressive confusion. MRI was limited but suggestive of small acute/early subacute small vessel ischemic infarct in the right parietal white matter. Previous MBS 03/16/15 recommended Dys 2 diet and nectar thick liquids due to silent aspiration of thin liquids. Pt admitted wtih Bell's Palsy at that time with incidental finding of left  frontal CVA.   Assessment / Plan / Recommendation Clinical Impression  Pt was lethargic upon SLP arrival, with MD and RN attempting to wake him further. He did receive ativan and haldol overnight. With additional stimulation by SLP he did wake up further and started following more one-step commands. His speech remains dysarthric and difficult to understand. He has appropriate oral acceptance and transit of POs administered, but delayed coughing follows small trials of thin liquids. For today would recommend he remain NPO except for meds crushed in puree given the above as well as his h/o dysphagia. Will f/u when more alert to assess for least restrictive diet and cognitive-linguistic function.    Aspiration Risk  Moderate aspiration risk    Diet Recommendation NPO except meds   Medication Administration: Crushed with puree    Other  Recommendations Oral Care Recommendations: Oral care QID   Follow up Recommendations Skilled Nursing facility      Frequency and Duration min 2x/week  2 weeks       Prognosis Prognosis for Safe Diet Advancement: Good      Swallow Study   General HPI: 83 y.o.malewith medical history significant of hypertension, hyperlipidemia, chronic kidney disease stage III, prior CVA, CHF with last EF of 55% April 2016, diabetes mellitus, hyperlipidemia who was recently hospitalized from 12/05/2016- 12/15/2016 for bilateral lower extremity cellulitis and edema, acute hypoxic respiratory failure, who presents to the ED from nursing facility at [facility] with a four-day history of slurred speech, progressive  confusion. MRI was limited but suggestive of small acute/early subacute small vessel ischemic infarct in the right parietal white matter. Previous MBS 03/16/15 recommended Dys 2 diet and nectar thick liquids due to silent aspiration of thin liquids. Pt admitted wtih Bell's Palsy at that time with incidental finding of left frontal CVA. Type of Study: Bedside Swallow  Evaluation Previous Swallow Assessment: see HPI Diet Prior to this Study: NPO Temperature Spikes Noted: No Respiratory Status: Nasal cannula History of Recent Intubation: No Behavior/Cognition: Lethargic/Drowsy;Requires cueing Oral Cavity Assessment: Dry Oral Care Completed by SLP: No Oral Cavity - Dentition: Edentulous Self-Feeding Abilities: Needs assist Patient Positioning: Upright in bed Baseline Vocal Quality: Normal    Oral/Motor/Sensory Function     Ice Chips Ice chips: Within functional limits Presentation: Spoon   Thin Liquid Thin Liquid: Impaired Presentation: Spoon Pharyngeal  Phase Impairments: Cough - Delayed    Nectar Thick Nectar Thick Liquid: Not tested   Honey Thick Honey Thick Liquid: Not tested   Puree Puree: Within functional limits Presentation: Spoon   Solid   GO   Solid: Not tested        Germain Osgood 01/02/2017,11:16 AM  Germain Osgood, M.A. CCC-SLP [phone]

## 2017-01-02 NOTE — Progress Notes (Signed)
Pt was combative when RT tried to place him BIPAP. Pt said to leave him alone and pushed the RT away. Pt refused BIPAP

## 2017-01-02 NOTE — Progress Notes (Signed)
OT Cancellation Note  Patient Details Name: Jared Hall MRN: [id number] DOB: Jun 15, 1931   Cancelled Treatment:    Reason Eval/Treat Not Completed: Medical issues which prohibited therapy;Patient not medically ready. Pt moving to step down unit due to respiratory status and lethargy. Will return as appropriate for OT evaluation.  [address] OTR/L [id number] Norman Herrlich, MS OTR/L  Pager: [phone]  01/02/2017, 3:47 PM

## 2017-01-02 NOTE — Progress Notes (Signed)
Attempted EEG. Pt covered his head with both his hands and refused. Took off O2 as well. Notified Nurse and neuro.

## 2017-01-02 NOTE — Progress Notes (Signed)
Pt has refused Bipap.  RN notified. PA paged.

## 2017-01-02 NOTE — Progress Notes (Signed)
Patient's neuro assessment unable to assess at this time. patient still very lethargic

## 2017-01-02 NOTE — Progress Notes (Signed)
**  Preliminary report by tech**  Carotid artery duplex complete. Unable to visualize right vertebral artery due to patient positioning. Unable to interrogate left carotid system due to patient becoming combative. Findings are consistent with a 1-39 percent stenosis involving the right internal carotid artery.   01/02/17 3:06 PM Jared Hall RVT

## 2017-01-02 NOTE — Progress Notes (Signed)
Neuro assessment unable to obtain at this time. Patient responds to painful stimuli. Patient did receive ativan and haldol for MRI. Will continue to monitor.

## 2017-01-02 NOTE — Progress Notes (Signed)
PROGRESS NOTE    Jared Hall  Jared Hall DOB: 1931-02-19 DOA: 01/01/2017 PCP: Henrine Screws, MD   Brief Narrative: Jared Hall is a 83 y.o. male with medical history significant of hypertension, hyperlipidemia, chronic kidney disease stage III, prior CVA, CHF with last EF of 55% April 2016, diabetes mellitus, hyperlipidemia who was recently hospitalized from 12/05/2016- 12/15/2016 for bilateral lower extremity cellulitis and edema, acute hypoxic respiratory failure, who presents to the ED from nursing facility at [facility] with a four-day history of slurred speech, progressive confusion. Patient with slurred dysarthric speech with an expressive aphasia and a such history obtained from ED PA and from the chart. Per chart in ED PA it was noted that patient had had a four-day history of altered mental status with slurred speech and also noted to have a left facial droop. ED RN spoke with staff at the nursing home it was noticed that patient began to be more confused on 12/29/2016. The morning of admission patient was noted to be more confused and had multiple falls and only oriented to self. Patient subsequently sent to the ED.   Assessment & Plan:   Principal Problem:   CVA (cerebral vascular accident) ([place]) Active Problems:   History of CVA (cerebrovascular accident)   Dyslipidemia   AKI (acute kidney injury) ([place])   Essential hypertension, benign   Type 1 diabetes mellitus with peripheral circulatory complications (HCC)   Depression   GERD (gastroesophageal reflux disease)   Diabetic neuropathy (HCC)   Venous stasis dermatitis   DM type 2 with diabetic peripheral neuropathy (HCC)   Paroxysmal atrial fibrillation (HCC)   Chronic kidney disease, stage III (moderate)   Morbid obesity ([place])   Dehydration   Anemia    1-Acute encephalopathy;  This could be multifactorial, secondary to medications, Vs stroke vs hypercapnia.  Check ABG showed increase PCO2. CCM consulted.    MRI showed stroke. Neurology consulted.  Transfer to step down unit.  Holding sedative; gabapentin, amitryptiline, bupropion.   2-Acute on chronic hypercapnic respiratory failure.  Transfer t step down unit.  CCM consulted. BIPAP ordered.   3-HTN; permissive HTN in setting of stroke.   4-LE cellulitis; start doxy.  Check doppler.   5-DM; SSI. Hold lantus.  Hypoglycemia; started on d 5.   Acute kidney injury on chronic kidney disease stage III Cr peak to 1.7. Improved with IV fluids.  Cr has decreased to 1.4.  history of PSVT/paroxysmal A. fib Patient currently rate controlled. Patient used to be on eliquis which was reportedly discontinued and patient on Plavix.    DVT prophylaxis: Lovenox Code Status: full code on admission , change to DNR  Family Communication: try to contact wife, doesn't answer phone.  Disposition Plan: transfer to step down unit.    Consultants:   CCM  Neurology    Procedures:   ECHO  Doppler.    Antimicrobials:   Doxy   Subjective: Patient is very lethargic, open eyes, mumble few words, falls back to sleep.  Patient received ativan and haldol last night.   Objective: Vitals:   01/02/17 0200 01/02/17 0400 01/02/17 0556 01/02/17 0800  BP: (!) 114/45 (!) 143/64 (!) 148/58 (!) 140/59  Pulse: 86 84 84 89  Resp: 18 20 18    Temp: 97.6 F (36.4 C)  98.5 F (36.9 C) 97.5 F (36.4 C)  TempSrc: Oral  Oral Oral  SpO2: 100% 100% 100% 100%  Weight:      Height:  No intake or output data in the 24 hours ending 01/02/17 1102 Filed Weights   01/01/17 1422 01/01/17 2030  Weight: 119.7 kg (264 lb) 118.5 kg (261 lb 4.8 oz)    Examination:  General exam: Appears calm., lethargic.  Respiratory system: bilateral ronchus . Respiratory effort normal. Cardiovascular system: S1 & S2 heard, RRR. No JVD, murmurs, rubs, gallops or clicks. No pedal edema. Gastrointestinal system: Abdomen is nondistended, soft and nontender. No  organomegaly or masses felt. Normal bowel sounds heard. Central nervous system: Alert and oriented. No focal neurological deficits. Extremities: trace edema., Skin: bilateral lower extremity with erythema.     Data Reviewed: I have personally reviewed following labs and imaging studies  CBC:  Recent Labs Lab 01/01/17 1507 01/02/17 0237  WBC 9.5 8.3  NEUTROABS 6.7  --   HGB 10.6* 10.3*  HCT 35.6* 34.9*  MCV 69.4* 70.2*  PLT 164 [id number]   Basic Metabolic Panel:  Recent Labs Lab 01/01/17 1507 01/02/17 0237  NA 141 143  K 4.0 3.5  CL 97* 98*  CO2 37* 35*  GLUCOSE 110* 84  BUN 18 15  CREATININE 1.77* 1.45*  CALCIUM 9.1 8.8*   GFR: Estimated Creatinine Clearance: 50.6 mL/min (by C-G formula based on SCr of 1.45 mg/dL (H)). Liver Function Tests:  Recent Labs Lab 01/01/17 1507  AST 24  ALT 19  ALKPHOS 68  BILITOT 0.7  PROT 5.9*  ALBUMIN 2.9*   No results for input(s): LIPASE, AMYLASE in the last 168 hours.  Recent Labs Lab 01/01/17 1507  AMMONIA 19   Coagulation Profile: No results for input(s): INR, PROTIME in the last 168 hours. Cardiac Enzymes: No results for input(s): CKTOTAL, CKMB, CKMBINDEX, TROPONINI in the last 168 hours. BNP (last 3 results) No results for input(s): PROBNP in the last 8760 hours. HbA1C: No results for input(s): HGBA1C in the last 72 hours. CBG:  Recent Labs Lab 01/01/17 2313 01/02/17 0008 01/02/17 0208 01/02/17 0409 01/02/17 0617  GLUCAP 101* 94 79 86 100*   Lipid Profile:  Recent Labs  01/02/17 0237  CHOL 130  HDL 28*  LDLCALC 76  TRIG 130  CHOLHDL 4.6   Thyroid Function Tests: No results for input(s): TSH, T4TOTAL, FREET4, T3FREE, THYROIDAB in the last 72 hours. Anemia Panel:  Recent Labs  01/02/17 0237  VITAMINB12 1,000*  FOLATE 18.7  FERRITIN 108  TIBC 248*  IRON 47   Sepsis Labs:  Recent Labs Lab 01/01/17 1650  LATICACIDVEN 0.88    No results found for this or any previous visit (from the  past 240 hour(s)).       Radiology Studies: Dg Chest 2 View  Result Date: 01/01/2017 CLINICAL DATA:  Altered mental status EXAM: CHEST  2 VIEW COMPARISON:  12/10/2016 FINDINGS: Cardiomegaly again noted. Stable chronic elevation of left hemidiaphragm and prominent fat pad in left cardiophrenic angle. There is linear atelectasis or infiltrate in lingula. No pulmonary edema. Right lung is clear IMPRESSION: Stable chronic elevation of left hemidiaphragm and prominent fat pad in left cardiophrenic angle. There is linear atelectasis or infiltrate in lingula. No pulmonary edema. Right lung is clear Electronically Signed   By: Lahoma Crocker M.D.   On: 01/01/2017 16:25   Ct Head Wo Contrast  Result Date: 01/01/2017 CLINICAL DATA:  Pt has very altered mental status talking in undistinguished words and wavings arms around EXAM: CT HEAD WITHOUT CONTRAST CT CERVICAL SPINE WITHOUT CONTRAST TECHNIQUE: Multidetector CT imaging of the head and cervical spine was performed following  the standard protocol without intravenous contrast. Multiplanar CT image reconstructions of the cervical spine were also generated. COMPARISON:  03/14/2015 and 03/15/2015 FINDINGS: CT HEAD FINDINGS Brain: There is moderate central and cortical atrophy. Periventricular white matter changes are consistent with small vessel disease. Stable appearance of subdural hygroma in the posterior fossa. Remote right cerebellar infarct. There is no intra or extra-axial mass lesion. The basilar cisterns and ventricles have a normal appearance. There is no CT evidence for acute infarction or hemorrhage. Vascular: There is atherosclerotic calcification of the carotid siphons. Skull: No calvarial fracture. Sinuses/Orbits: No acute finding. Other: None CT CERVICAL SPINE FINDINGS Alignment: There is significant degenerative changes in mid and lower cervical spine. Degenerative anterolisthesis of C5 on C6 measures approximately 4 mm. Skull base and vertebrae: No  acute fracture. No primary bone lesion or focal pathologic process. Soft tissues and spinal canal: No prevertebral fluid or swelling. No visible canal hematoma. Disc levels: Significant disc height loss throughout the cervical spine. Bilateral foraminal narrowing at C3-4. Bilateral foraminal narrowing at C4-5. Bilateral foraminal narrowing at C6-7. Significant facet hypertrophy at all levels. Upper chest: Unremarkable. Other: Study quality is degraded by patient motion artifact. IMPRESSION: 1. Atrophy and small vessel disease. 2. 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 3. Remote lacunar infarct of the right cerebellar hemisphere. 4.  No evidence for acute CT abnormality. 5. Significant cervical spine degenerative changes without evidence for acute abnormality. Electronically Signed   By: Nolon Nations M.D.   On: 01/01/2017 16:10   Ct Cervical Spine Wo Contrast  Result Date: 01/01/2017 CLINICAL DATA:  Pt has very altered mental status talking in undistinguished words and wavings arms around EXAM: CT HEAD WITHOUT CONTRAST CT CERVICAL SPINE WITHOUT CONTRAST TECHNIQUE: Multidetector CT imaging of the head and cervical spine was performed following the standard protocol without intravenous contrast. Multiplanar CT image reconstructions of the cervical spine were also generated. COMPARISON:  03/14/2015 and 03/15/2015 FINDINGS: CT HEAD FINDINGS Brain: There is moderate central and cortical atrophy. Periventricular white matter changes are consistent with small vessel disease. Stable appearance of subdural hygroma in the posterior fossa. Remote right cerebellar infarct. There is no intra or extra-axial mass lesion. The basilar cisterns and ventricles have a normal appearance. There is no CT evidence for acute infarction or hemorrhage. Vascular: There is atherosclerotic calcification of the carotid siphons. Skull: No calvarial fracture. Sinuses/Orbits: No acute finding. Other: None CT CERVICAL SPINE FINDINGS  Alignment: There is significant degenerative changes in mid and lower cervical spine. Degenerative anterolisthesis of C5 on C6 measures approximately 4 mm. Skull base and vertebrae: No acute fracture. No primary bone lesion or focal pathologic process. Soft tissues and spinal canal: No prevertebral fluid or swelling. No visible canal hematoma. Disc levels: Significant disc height loss throughout the cervical spine. Bilateral foraminal narrowing at C3-4. Bilateral foraminal narrowing at C4-5. Bilateral foraminal narrowing at C6-7. Significant facet hypertrophy at all levels. Upper chest: Unremarkable. Other: Study quality is degraded by patient motion artifact. IMPRESSION: 1. Atrophy and small vessel disease. 2. 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 3. Remote lacunar infarct of the right cerebellar hemisphere. 4.  No evidence for acute CT abnormality. 5. Significant cervical spine degenerative changes without evidence for acute abnormality. Electronically Signed   By: Nolon Nations M.D.   On: 01/01/2017 16:10   Mr Brain Wo Contrast  Result Date: 01/01/2017 CLINICAL DATA:  Initial evaluation for acute slurred speech, progressive confusion. EXAM: MRI HEAD WITHOUT CONTRAST TECHNIQUE: Multiplanar, multiecho pulse sequences  of the brain and surrounding structures were obtained without intravenous contrast. COMPARISON:  Prior CT from earlier the same day. FINDINGS: Brain: The study is limited as the patient was unable to tolerate the full length of the exam. Additionally, images provided are moderately degraded by motion artifact. Diffuse prominence of the CSF containing spaces is compatible with generalized age-related cerebral atrophy. Patchy T2/FLAIR hyperintensity within the periventricular and deep white matter both cerebral hemispheres most consistent with chronic microvascular ischemic disease, fairly advanced in nature. Chronic microvascular ischemic changes present within the pons. Remote  bilateral cerebellar infarcts noted. Additional focus of cortical encephalomalacia within the right parietal lobe compatible with remote ischemic infarct as well. There is a punctate 5-6 mm focus of diffusion abnormality within the right periatrial white matter (series 3, image 31), suspicious for a small acute/ early subacute small vessel ischemic infarct. No associated mass effect. No definite associated hemorrhage, although evaluation for blood products limited on this exam. No other abnormal foci of restricted diffusion to suggest acute or subacute ischemia. Gray-white matter differentiation otherwise maintained. No mass lesion, midline shift, or mass effect. Ventricular prominence related to global parenchymal volume loss without hydrocephalus. No extra-axial fluid collection. Major dural sinuses are grossly patent. Vascular: Major intracranial vascular flow voids are maintained. Right vertebral artery hypoplastic. Skull and upper cervical spine: Craniocervical junction grossly unremarkable, although not well evaluated on this limited exam. Scalp soft tissues unremarkable. Calvarium intact. Bone marrow signal intensity grossly normal. Sinuses/Orbits: Globes and orbital soft tissues within normal limits. Paranasal sinuses are largely clear. Small bilateral mastoid effusions, left greater than right. Inner ear structures grossly normal. IMPRESSION: 1. Limited study due to patient's inability to tolerate the full length of the exam and motion artifact. 2. 5-6 mm focus of diffusion abnormality within the right periatrial white matter, suspicious for a small acute/early subacute small vessel ischemic infarct. 3. No other acute intracranial process identified. 4. Advanced cerebral atrophy with chronic microvascular ischemic disease and scattered remote ischemic infarcts as above. Electronically Signed   By: Jeannine Boga M.D.   On: 01/01/2017 23:17        Scheduled Meds: . aspirin  300 mg Rectal Daily     Or  . aspirin  325 mg Oral Daily  . buPROPion  150 mg Oral Daily  . clopidogrel  75 mg Oral Daily  . enoxaparin (LOVENOX) injection  40 mg Subcutaneous Q24H  . fluticasone  1 spray Each Nare Daily  . gabapentin  300 mg Oral TID  . insulin aspart  0-9 Units Subcutaneous TID WC  . insulin glargine  20 Units Subcutaneous QHS  . pantoprazole  40 mg Oral Daily  . simvastatin  20 mg Oral QHS  . sodium chloride  500 mL Intravenous Once  . vitamin B-12  1,000 mcg Oral Daily   Continuous Infusions: . dextrose 5 % and 0.9% NaCl 75 mL/hr at 01/02/17 0252     LOS: 1 day    Time spent: 35 minutes.     Elmarie Shiley, MD [facility] Pager [phone]  If 7PM-7AM, please contact night-coverage [email or web address] Password [email or web address] 01/02/2017, 11:02 AM

## 2017-01-02 NOTE — Progress Notes (Signed)
Patient's blood sugar keeps dropping down. The last was 26. MD paged to see if fluid can be changed. Awaiting a response.

## 2017-01-02 NOTE — Progress Notes (Signed)
Writer in Patient's room to do neuro checks, noted that patient is shaking over for a period of 5 mins. MD paged to notify. Received a call back from MD, who saud to continue monitoring patient.

## 2017-01-02 NOTE — Progress Notes (Signed)
Pt transferred to  [address]. Family notified. Report called.

## 2017-01-02 NOTE — Consult Note (Signed)
Name: Jared Hall MRN: [id number] DOB: [birth year]    ADMISSION DATE:  01/01/2017 CONSULTATION DATE:  01/02/17  REFERRING MD :  Jared Hall / [facility]   CHIEF COMPLAINT:  Acute Hypercarbic Respiratory Failure    HISTORY OF PRESENT ILLNESS:  83 y/o M admitted 2/12 from SNF with reports of multiple falls and altered mental status.  The patient reportedly is at the SNF for cellulitis of the lower extremities and rehab.  Staff reported he had been progressively more confused since the 9th of February.  On exam in the ER, there were concerns for possible left facial droop / ataxia concerning for subacute CVA.  Admit labs - WBC 9.5, Hgb 10.6, Na 143, K 3.5, CL 97, CO2 37, Sr Cr 1.77, albumin 2.9, lactic 0.88, negative troponin, BNP 42.2.  UA was negative. CT of the head and C-Spine were assessed and demonstrated atrophy, small vessel disease, stable subdural hygroma of the posterior fossa, remote lacunar infarct of the right cerebellar hemisphere, no acute abnormalities.  EKG with NSR.  The patient was admitted by [facility] for evaluation of CVA.  Overnight 2/12, rapid response team was called for agitation and concern for possible seizure activity which was evaluated to be agitation.  He was treated with 1mg  IV ativan and 2.5mg  IV haldol.  During agitation period, he was treated with dextrose for hypoglycemia.  He had an additional 5-6 minute period of focal confusion and agitation.  Overnight into the day 2/13, the patient remained somewhat lethargic and had significant dysarthria.  Neurology was consulted for evaluation.  MRI was obtained but limited due to motion.  Recommendations were for further work up of metabolic / toxic encephalopathy.  ABG assessed afternoon of 2/13 > 7.34 / 68.4 / 93 / 36.    PCCM consulted for evaluation of altered mental status / hypercarbic respiratory failure.    PMH: BPH, DM II, alpha thalassemia / anemia, HLD, HTN, CHF, chronic LE edema, CVA, morbid obesity and OSA  PAST MEDICAL  HISTORY :   has a past medical history of Alpha thalassemia ([place]); Anemia (01/01/2017); BPH (benign prostatic hyperplasia); CHF (congestive heart failure) ([place]); High cholesterol; Hypertension; Lower extremity edema; Onychomycosis; Prostate cancer ([place]); Prostatitis; Sleep apnea; Stroke ([place]) (~ 2005); Type II diabetes mellitus ([place]); Umbilical hernia; and Urinary urgency.   has a past surgical history that includes Ventral hernia repair (12/18/2011); Hernia repair; Vasectomy; Mastoidectomy; Tonsillectomy; and Adenoidectomy.  Prior to Admission medications   Medication Sig Start Date End Date Taking? Authorizing Provider  amitriptyline (ELAVIL) 25 MG tablet Take 25 mg by mouth at bedtime.   Yes Historical Provider, MD  aspirin EC 81 MG tablet Take 81 mg by mouth daily.   Yes Historical Provider, MD  B Complex Vitamins (VITAMIN B COMPLEX PO) Take 1 tablet by mouth daily.   Yes Historical Provider, MD  buPROPion (WELLBUTRIN SR) 150 MG 12 hr tablet Take 150 mg by mouth daily. 11/17/16  Yes Historical Provider, MD  clopidogrel (PLAVIX) 75 MG tablet Take 75 mg by mouth daily.    Yes Historical Provider, MD  clotrimazole-betamethasone (LOTRISONE) lotion Apply 1 application topically 2 (two) times daily as needed (antifungal).    Yes Historical Provider, MD  fluticasone (FLONASE) 50 MCG/ACT nasal spray Place 1 spray into both nostrils daily. 03/17/15  Yes Kelvin Cellar, MD  furosemide (LASIX) 40 MG tablet Take 40-80 mg by mouth daily. Take 80 mg QAM and 40 mg QPM for CHF   Yes Historical Provider, MD  gabapentin (  NEURONTIN) 300 MG capsule Take 300 mg by mouth 3 (three) times daily.   Yes Historical Provider, MD  guaiFENesin (MUCINEX) 600 MG 12 hr tablet Take 2 tablets (1,200 mg total) by mouth 2 (two) times daily as needed. Patient taking differently: Take 600 mg by mouth 2 (two) times daily as needed. For seven days 12/15/16  Yes Debbe Odea, MD  insulin glargine (LANTUS) 100 UNIT/ML injection Inject 0.4  mLs (40 Units total) into the skin 2 (two) times daily. 12/15/16  Yes Debbe Odea, MD  metoprolol tartrate (LOPRESSOR) 25 MG tablet Take 1 tablet (25 mg total) by mouth 2 (two) times daily. 03/17/15  Yes Kelvin Cellar, MD  moxifloxacin (AVELOX) 400 MG tablet Take 400 mg by mouth daily at 8 pm. For seven days 12/28/16 01/04/17 Yes Historical Provider, MD  pantoprazole (PROTONIX) 40 MG tablet Take 40 mg by mouth daily.   Yes Historical Provider, MD  potassium chloride SA (K-DUR,KLOR-CON) 20 MEQ tablet Take 20 mEq by mouth 2 (two) times daily.    Yes Historical Provider, MD  Probiotic Product (PROBIOTIC PO) Take 1 capsule by mouth 2 (two) times daily. For seven days 12/28/16 01/04/17 Yes Historical Provider, MD  sennosides-docusate sodium (SENOKOT-S) 8.6-50 MG tablet Take 1 tablet by mouth daily as needed.    Yes Historical Provider, MD  simvastatin (ZOCOR) 20 MG tablet Take 20 mg by mouth daily.   Yes Historical Provider, MD  terazosin (HYTRIN) 2 MG capsule Take 4 mg by mouth daily. Take two 2-mg tablets to = 4 mg   Yes Historical Provider, MD  terbinafine (LAMISIL) 250 MG tablet Take 250 mg by mouth daily. continous 11/08/16  Yes Historical Provider, MD  traMADol (ULTRAM) 50 MG tablet Take 50 mg by mouth every 6 (six) hours as needed for moderate pain.   Yes Historical Provider, MD  vitamin B-12 (CYANOCOBALAMIN) 1000 MCG tablet Take 1,000 mcg by mouth daily.   Yes Historical Provider, MD    Allergies  Allergen Reactions  . Ativan [Lorazepam] Other (See Comments)    Flailing of extremities noted immediately s/p IV administration.  [name] Kitchen Flomax [Tamsulosin Hcl] Other (See Comments)    Excessive tearing  . Glucophage [Metformin Hcl] Diarrhea   FAMILY HISTORY:  family history includes Heart attack in his father; Pneumonia in his mother.  SOCIAL HISTORY:  reports that he has never smoked. He has never used smokeless tobacco. He reports that he does not drink alcohol or use drugs.  REVIEW OF SYSTEMS:   POSITIVES IN BOLD Constitutional: Negative for fever, chills, weight loss, malaise/fatigue and diaphoresis.  HENT: Negative for hearing loss, ear pain, nosebleeds, congestion, sore throat, neck pain, tinnitus and ear discharge.   Eyes: Negative for blurred vision, double vision, photophobia, pain, discharge and redness.  Respiratory: Negative for cough, hemoptysis, sputum production, shortness of breath, wheezing and stridor.   Cardiovascular: Negative for chest pain, palpitations, orthopnea, claudication, leg swelling and PND.  Gastrointestinal: Negative for heartburn, nausea, vomiting, abdominal pain, diarrhea, constipation, blood in stool and melena.  Genitourinary: Negative for dysuria, urgency, frequency, hematuria and flank pain.  Musculoskeletal: Negative for myalgias, back pain, joint pain and falls.  Skin: Negative for itching and rash.  Neurological: Negative for dizziness, tingling, tremors, sensory change, speech change, focal weakness, seizures, loss of consciousness, weakness and headaches. Drowsiness / sleepiness Endo/Heme/Allergies: Negative for environmental allergies and polydipsia. Does not bruise/bleed easily.  SUBJECTIVE:   VITAL SIGNS: Temp:  [97.5 F (36.4 C)-98.5 F (36.9 C)] 97.7 F (36.5 C) (  02/13 1000) Pulse Rate:  [80-91] 87 (02/13 1000) Resp:  [16-29] 18 (02/13 0556) BP: (104-163)/(45-105) 151/67 (02/13 1000) SpO2:  [95 %-100 %] 96 % (02/13 1000) Weight:  [261 lb 4.8 oz (118.5 kg)-264 lb (119.7 kg)] 261 lb 4.8 oz (118.5 kg) (02/12 2030)  PHYSICAL EXAMINATION: General:  Chronically ill appearing male in NAD  Neuro:  Lethargic but arouses to voice, oriented to person / place, falls back to sleep  HEENT:  Edentulous, MM pink/moist Cardiovascular:  s1s2 rrr, distant tones  Lungs:  Even/non-labored, lungs bilaterally diminished but clear  Abdomen:  Obese/soft, bsx4 active  Musculoskeletal:  No acute deformities  Skin:  Warm/dry, BLE with erythema,  scaling/yellow crusting   Recent Labs Lab 01/01/17 1507 01/02/17 0237  NA 141 143  K 4.0 3.5  CL 97* 98*  CO2 37* 35*  BUN 18 15  CREATININE 1.77* 1.45*  GLUCOSE 110* 84     Recent Labs Lab 01/01/17 1507 01/02/17 0237  HGB 10.6* 10.3*  HCT 35.6* 34.9*  WBC 9.5 8.3  PLT 164 151    Dg Chest 2 View  Result Date: 01/01/2017 CLINICAL DATA:  Altered mental status EXAM: CHEST  2 VIEW COMPARISON:  12/10/2016 FINDINGS: Cardiomegaly again noted. Stable chronic elevation of left hemidiaphragm and prominent fat pad in left cardiophrenic angle. There is linear atelectasis or infiltrate in lingula. No pulmonary edema. Right lung is clear IMPRESSION: Stable chronic elevation of left hemidiaphragm and prominent fat pad in left cardiophrenic angle. There is linear atelectasis or infiltrate in lingula. No pulmonary edema. Right lung is clear Electronically Signed   By: Lahoma Crocker M.D.   On: 01/01/2017 16:25   Ct Head Wo Contrast  Result Date: 01/01/2017 CLINICAL DATA:  Pt has very altered mental status talking in undistinguished words and wavings arms around EXAM: CT HEAD WITHOUT CONTRAST CT CERVICAL SPINE WITHOUT CONTRAST TECHNIQUE: Multidetector CT imaging of the head and cervical spine was performed following the standard protocol without intravenous contrast. Multiplanar CT image reconstructions of the cervical spine were also generated. COMPARISON:  03/14/2015 and 03/15/2015 FINDINGS: CT HEAD FINDINGS Brain: There is moderate central and cortical atrophy. Periventricular white matter changes are consistent with small vessel disease. Stable appearance of subdural hygroma in the posterior fossa. Remote right cerebellar infarct. There is no intra or extra-axial mass lesion. The basilar cisterns and ventricles have a normal appearance. There is no CT evidence for acute infarction or hemorrhage. Vascular: There is atherosclerotic calcification of the carotid siphons. Skull: No calvarial fracture.  Sinuses/Orbits: No acute finding. Other: None CT CERVICAL SPINE FINDINGS Alignment: There is significant degenerative changes in mid and lower cervical spine. Degenerative anterolisthesis of C5 on C6 measures approximately 4 mm. Skull base and vertebrae: No acute fracture. No primary bone lesion or focal pathologic process. Soft tissues and spinal canal: No prevertebral fluid or swelling. No visible canal hematoma. Disc levels: Significant disc height loss throughout the cervical spine. Bilateral foraminal narrowing at C3-4. Bilateral foraminal narrowing at C4-5. Bilateral foraminal narrowing at C6-7. Significant facet hypertrophy at all levels. Upper chest: Unremarkable. Other: Study quality is degraded by patient motion artifact. IMPRESSION: 1. Atrophy and small vessel disease. 2. 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 3. Remote lacunar infarct of the right cerebellar hemisphere. 4.  No evidence for acute CT abnormality. 5. Significant cervical spine degenerative changes without evidence for acute abnormality. Electronically Signed   By: Nolon Nations M.D.   On: 01/01/2017 16:10   Ct Cervical Spine Wo  Contrast  Result Date: 01/01/2017 CLINICAL DATA:  Pt has very altered mental status talking in undistinguished words and wavings arms around EXAM: CT HEAD WITHOUT CONTRAST CT CERVICAL SPINE WITHOUT CONTRAST TECHNIQUE: Multidetector CT imaging of the head and cervical spine was performed following the standard protocol without intravenous contrast. Multiplanar CT image reconstructions of the cervical spine were also generated. COMPARISON:  03/14/2015 and 03/15/2015 FINDINGS: CT HEAD FINDINGS Brain: There is moderate central and cortical atrophy. Periventricular white matter changes are consistent with small vessel disease. Stable appearance of subdural hygroma in the posterior fossa. Remote right cerebellar infarct. There is no intra or extra-axial mass lesion. The basilar cisterns and ventricles have  a normal appearance. There is no CT evidence for acute infarction or hemorrhage. Vascular: There is atherosclerotic calcification of the carotid siphons. Skull: No calvarial fracture. Sinuses/Orbits: No acute finding. Other: None CT CERVICAL SPINE FINDINGS Alignment: There is significant degenerative changes in mid and lower cervical spine. Degenerative anterolisthesis of C5 on C6 measures approximately 4 mm. Skull base and vertebrae: No acute fracture. No primary bone lesion or focal pathologic process. Soft tissues and spinal canal: No prevertebral fluid or swelling. No visible canal hematoma. Disc levels: Significant disc height loss throughout the cervical spine. Bilateral foraminal narrowing at C3-4. Bilateral foraminal narrowing at C4-5. Bilateral foraminal narrowing at C6-7. Significant facet hypertrophy at all levels. Upper chest: Unremarkable. Other: Study quality is degraded by patient motion artifact. IMPRESSION: 1. Atrophy and small vessel disease. 2. 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 3. Remote lacunar infarct of the right cerebellar hemisphere. 4.  No evidence for acute CT abnormality. 5. Significant cervical spine degenerative changes without evidence for acute abnormality. Electronically Signed   By: Nolon Nations M.D.   On: 01/01/2017 16:10   Mr Brain Wo Contrast  Result Date: 01/01/2017 CLINICAL DATA:  Initial evaluation for acute slurred speech, progressive confusion. EXAM: MRI HEAD WITHOUT CONTRAST TECHNIQUE: Multiplanar, multiecho pulse sequences of the brain and surrounding structures were obtained without intravenous contrast. COMPARISON:  Prior CT from earlier the same day. FINDINGS: Brain: The study is limited as the patient was unable to tolerate the full length of the exam. Additionally, images provided are moderately degraded by motion artifact. Diffuse prominence of the CSF containing spaces is compatible with generalized age-related cerebral atrophy. Patchy T2/FLAIR  hyperintensity within the periventricular and deep white matter both cerebral hemispheres most consistent with chronic microvascular ischemic disease, fairly advanced in nature. Chronic microvascular ischemic changes present within the pons. Remote bilateral cerebellar infarcts noted. Additional focus of cortical encephalomalacia within the right parietal lobe compatible with remote ischemic infarct as well. There is a punctate 5-6 mm focus of diffusion abnormality within the right periatrial white matter (series 3, image 31), suspicious for a small acute/ early subacute small vessel ischemic infarct. No associated mass effect. No definite associated hemorrhage, although evaluation for blood products limited on this exam. No other abnormal foci of restricted diffusion to suggest acute or subacute ischemia. Gray-white matter differentiation otherwise maintained. No mass lesion, midline shift, or mass effect. Ventricular prominence related to global parenchymal volume loss without hydrocephalus. No extra-axial fluid collection. Major dural sinuses are grossly patent. Vascular: Major intracranial vascular flow voids are maintained. Right vertebral artery hypoplastic. Skull and upper cervical spine: Craniocervical junction grossly unremarkable, although not well evaluated on this limited exam. Scalp soft tissues unremarkable. Calvarium intact. Bone marrow signal intensity grossly normal. Sinuses/Orbits: Globes and orbital soft tissues within normal limits. Paranasal sinuses are largely  clear. Small bilateral mastoid effusions, left greater than right. Inner ear structures grossly normal. IMPRESSION: 1. Limited study due to patient's inability to tolerate the full length of the exam and motion artifact. 2. 5-6 mm focus of diffusion abnormality within the right periatrial white matter, suspicious for a small acute/early subacute small vessel ischemic infarct. 3. No other acute intracranial process identified. 4. Advanced  cerebral atrophy with chronic microvascular ischemic disease and scattered remote ischemic infarcts as above. Electronically Signed   By: Jeannine Boga M.D.   On: 01/01/2017 23:17     STUDIES:  CT head 02/13 > atrophy and small vessel disease, stable hygroma, remote lacunar infarct of right cerebellar hemisphere, significant cervical spine degenerative changes. MRI brain 02/13 > limited study due to motion.  5-20mm focus within right periatrial white matter suspicious for small acute / early subacute small vessel ischemic infarct.  CULTURES: R leg 12/05/16 (prior admit) > pseudomonas (resistant to amp, unasyn, cefazolin).  ANTIBIOTICS: Doxy 02/12 >   SIGNIFICANT EVENTS: 02/12 > admit. 02/13 > PCCM consult.  LINES/TUBES: None.  DISCUSSION: 83 y.o. male who is at [facility] for LE cellulitis and recent falls, brought to [facility] 02/12 with AMS and falls.  MRI showed possible infarct but was incomplete study.  Had received ativan and haldol prior to MRI due to him not being able to stay still, then later became more altered. ABG with acute on chronic hypercarbic respiratory failure so PCCM consulted. After discussions with family (son, [name]), it was learned that pt had previously had a DNR form out of hospital.   ASSESSMENT / PLAN:  PULMONARY A: Acute on chronic hypercarbic respiratory failure - compensated. At risk for intubation if mental status declines. Hx OSA. P:   Can use BiPAP now temporarily while ativan / haldol wears off (would not keep on continuous as pt likely lives with pCO2 in high 50's - low 60's range). Supplemental O2 as needed only to maintain SpO2 > 88%. Pulmonary hygiene. BD's. CXR in AM.  CARDIOVASCULAR A:  Hx HTN, HLD. P:  Monitor hemodynamics. Continue preadmission ASA, plavix, simvastatin. Hold preadmission furosemide, lopressor.  RENAL A:   AoCKD. P:   D5NS @ 75. BMP in AM.  GASTROINTESTINAL A:   Nutrition. P:   NPO. SLP  eval.  HEMATOLOGIC A:   VTE Prophylaxis. Hx alpha thalassemia, prostate CA (s/p XRT). P:  SCD's / enoxaparin. CBC in AM.  INFECTIOUS A:   Bilateral LE cellulitis P:   Abx as above (doxy).  Follow cultures as above.  ENDOCRINE A:   DM II.  P:   SSI.  NEUROLOGIC A:   Acute encephalopathy - concern for acute CVA.  Ammonia, folate normal. B12 very mildly elevated.  TSH and UDS pending. Hx CVA. P:   Neurology following. Hold preadmission amitriptyline, bupropion, gabapentin.  Family updated:  Called and personally spoke with pt's son [name] to update him on events above.  [name] informed me that pt has had a DNR form for several years now and reassured me that Jared Hall would never want to be placed on any form of life support or undergo any form of heroic measures.  Interdisciplinary Family Meeting v Palliative Care Meeting:  Due by: 01/08/17.  CC time: 30 min.   Jared Hora, [facility] Pager: [phone]  or [phone] 01/02/2017, 2:39 PM  ATTENDING NOTE / ATTESTATION NOTE :   I have discussed the case with  the resident/APP  Jared Hora PA.  I agree with the resident/APP's  history, physical examination, assessment, and plans.    I have edited the above note and modified it according to our agreed history, physical examination, assessment and plan.   Briefly, 83 y/o M admitted 2/12 from SNF with reports of multiple falls and altered mental status.  The patient reportedly is at the SNF for cellulitis of the lower extremities and rehab.  Staff reported he had been progressively more confused since the 9th of February.  On exam in the ER, there were concerns for possible left facial droop / ataxia concerning for subacute CVA.  Admit labs - WBC 9.5, Hgb 10.6, Na 143, K 3.5, CL 97, CO2 37, Sr Cr 1.77, albumin 2.9, lactic 0.88, negative troponin, BNP 42.2.  UA was negative. CT of the head and C-Spine were assessed  and demonstrated atrophy, small vessel disease, stable subdural hygroma of the posterior fossa, remote lacunar infarct of the right cerebellar hemisphere, no acute abnormalities.  EKG with NSR.  The patient was admitted by [facility] for evaluation of CVA.  Overnight 2/12, rapid response team was called for agitation and concern for possible seizure activity which was evaluated to be agitation.  He was treated with 1mg  IV ativan and 2.5mg  IV haldol.  During agitation period, he was treated with dextrose for hypoglycemia.  He had an additional 5-6 minute period of focal confusion and agitation.  Overnight into the day 2/13, the patient remained somewhat lethargic and had significant dysarthria.  Neurology was consulted for evaluation.  MRI was obtained but limited due to motion.  Recommendations were for further work up of metabolic / toxic encephalopathy.  ABG assessed afternoon of 2/13 > 7.34 / 68.4 / 93 / 36.  PCCM called for further management.  Pt was seen, sleeping, arousable, confused.  (-) subjective complaints.  VSS. (-) desatn on RA.   Vitals:  Vitals:   01/02/17 0800 01/02/17 1000 01/02/17 1430 01/02/17 1530  BP: (!) 140/59 (!) 151/67  126/64  Pulse: 89 87 81 86  Resp:   (!) 22 (!) 22  Temp: 97.5 F (36.4 C) 97.7 F (36.5 C) 98.3 F (36.8 C) 98.7 F (37.1 C)  TempSrc: Oral Oral Axillary Axillary  SpO2: 100% 96% 99% 100%  Weight:      Height:        Constitutional/General: well-nourished, well-developed, not in any distress  Body mass index is 36.44 kg/m. Wt Readings from Last 3 Encounters:  01/01/17 261 lb 4.8 oz (118.5 kg)  12/25/16 264 lb (119.7 kg)  12/15/16 264 lb 1.8 oz (119.8 kg)    HEENT: PERLA, anicteric sclerae. (-) Oral thrush.   Neck: No masses. Midline trachea. No JVD, (-) LAD. (-) bruits appreciated.  Respiratory/Chest: Grossly normal chest. (-) deformity. (-) Accessory muscle use.  Symmetric expansion. Diminished BS on both lower lung zones. (-) wheezing,  crackles, rhonchi (-) egophony  Cardiovascular: Regular rate and  rhythm, heart sounds normal, no murmur or gallops,  Gr 2 peripheral edema  Gastrointestinal:  Normal bowel sounds. Soft, non-tender. No hepatosplenomegaly.  (-) masses.   Musculoskeletal:  Normal muscle tone.   Extremities: Grossly normal. (-) clubbing, cyanosis.  Gr 2 edema with erythema of BLE.   Skin: (-) rash,lesions seen.   Neurological/Psychiatric : sleeping.  (-) deficits elicited but exam limited.     CBC Recent Labs     01/01/17  1507  01/02/17  0237  WBC  9.5  8.3  HGB  10.6*  10.3*  HCT  35.6*  34.9*  PLT  164  151    Coag's No results for input(s): APTT, INR in the last 72 hours.  BMET Recent Labs     01/01/17  1507  01/02/17  0237  NA  141  143  K  4.0  3.5  CL  97*  98*  CO2  37*  35*  BUN  18  15  CREATININE  1.77*  1.45*  GLUCOSE  110*  84    Electrolytes Recent Labs     01/01/17  1507  01/02/17  0237  CALCIUM  9.1  8.8*    Sepsis Markers No results for input(s): PROCALCITON, O2SATVEN in the last 72 hours.  Invalid input(s): LACTICACIDVEN  ABG Recent Labs     01/02/17  1155  PHART  7.349*  PCO2ART  68.4*  PO2ART  93.9    Liver Enzymes Recent Labs     01/01/17  1507  AST  24  ALT  19  ALKPHOS  68  BILITOT  0.7  ALBUMIN  2.9*    Cardiac Enzymes No results for input(s): TROPONINI, PROBNP in the last 72 hours.  Glucose Recent Labs     01/02/17  0008  01/02/17  0208  01/02/17  0409  01/02/17  0617  01/02/17  1124  01/02/17  1614  GLUCAP  94  79  86  100*  105*  104*    Imaging Dg Chest 2 View  Result Date: 01/01/2017 CLINICAL DATA:  Altered mental status EXAM: CHEST  2 VIEW COMPARISON:  12/10/2016 FINDINGS: Cardiomegaly again noted. Stable chronic elevation of left hemidiaphragm and prominent fat pad in left cardiophrenic angle. There is linear atelectasis or infiltrate in lingula. No pulmonary edema. Right lung is clear IMPRESSION: Stable  chronic elevation of left hemidiaphragm and prominent fat pad in left cardiophrenic angle. There is linear atelectasis or infiltrate in lingula. No pulmonary edema. Right lung is clear Electronically Signed   By: Lahoma Crocker M.D.   On: 01/01/2017 16:25   Ct Head Wo Contrast  Result Date: 01/01/2017 CLINICAL DATA:  Pt has very altered mental status talking in undistinguished words and wavings arms around EXAM: CT HEAD WITHOUT CONTRAST CT CERVICAL SPINE WITHOUT CONTRAST TECHNIQUE: Multidetector CT imaging of the head and cervical spine was performed following the standard protocol without intravenous contrast. Multiplanar CT image reconstructions of the cervical spine were also generated. COMPARISON:  03/14/2015 and 03/15/2015 FINDINGS: CT HEAD FINDINGS Brain: There is moderate central and cortical atrophy. Periventricular white matter changes are consistent with small vessel disease. Stable appearance of subdural hygroma in the posterior fossa. Remote right cerebellar infarct. There is no intra or extra-axial mass lesion. The basilar cisterns and ventricles have a normal appearance. There is no CT evidence for acute infarction or hemorrhage. Vascular: There is atherosclerotic calcification of the carotid siphons. Skull: No calvarial fracture. Sinuses/Orbits: No acute finding. Other: None CT CERVICAL SPINE FINDINGS Alignment: There is significant degenerative changes in mid and lower cervical spine. Degenerative anterolisthesis of C5 on C6 measures approximately 4 mm. Skull base and vertebrae: No acute fracture. No primary bone lesion or focal pathologic process. Soft tissues and spinal canal: No prevertebral fluid or swelling. No visible canal hematoma. Disc levels: Significant disc height loss throughout the cervical spine. Bilateral foraminal narrowing at C3-4. Bilateral foraminal narrowing at C4-5. Bilateral foraminal narrowing at C6-7. Significant facet hypertrophy at all levels. Upper chest: Unremarkable.  Other: Study quality  is degraded by patient motion artifact. IMPRESSION: 1. Atrophy and small vessel disease. 2. 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 3. Remote lacunar infarct of the right cerebellar hemisphere. 4.  No evidence for acute CT abnormality. 5. Significant cervical spine degenerative changes without evidence for acute abnormality. Electronically Signed   By: Nolon Nations M.D.   On: 01/01/2017 16:10   Ct Cervical Spine Wo Contrast  Result Date: 01/01/2017 CLINICAL DATA:  Pt has very altered mental status talking in undistinguished words and wavings arms around EXAM: CT HEAD WITHOUT CONTRAST CT CERVICAL SPINE WITHOUT CONTRAST TECHNIQUE: Multidetector CT imaging of the head and cervical spine was performed following the standard protocol without intravenous contrast. Multiplanar CT image reconstructions of the cervical spine were also generated. COMPARISON:  03/14/2015 and 03/15/2015 FINDINGS: CT HEAD FINDINGS Brain: There is moderate central and cortical atrophy. Periventricular white matter changes are consistent with small vessel disease. Stable appearance of subdural hygroma in the posterior fossa. Remote right cerebellar infarct. There is no intra or extra-axial mass lesion. The basilar cisterns and ventricles have a normal appearance. There is no CT evidence for acute infarction or hemorrhage. Vascular: There is atherosclerotic calcification of the carotid siphons. Skull: No calvarial fracture. Sinuses/Orbits: No acute finding. Other: None CT CERVICAL SPINE FINDINGS Alignment: There is significant degenerative changes in mid and lower cervical spine. Degenerative anterolisthesis of C5 on C6 measures approximately 4 mm. Skull base and vertebrae: No acute fracture. No primary bone lesion or focal pathologic process. Soft tissues and spinal canal: No prevertebral fluid or swelling. No visible canal hematoma. Disc levels: Significant disc height loss throughout the cervical spine.  Bilateral foraminal narrowing at C3-4. Bilateral foraminal narrowing at C4-5. Bilateral foraminal narrowing at C6-7. Significant facet hypertrophy at all levels. Upper chest: Unremarkable. Other: Study quality is degraded by patient motion artifact. IMPRESSION: 1. Atrophy and small vessel disease. 2. 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 3. Remote lacunar infarct of the right cerebellar hemisphere. 4.  No evidence for acute CT abnormality. 5. Significant cervical spine degenerative changes without evidence for acute abnormality. Electronically Signed   By: Nolon Nations M.D.   On: 01/01/2017 16:10   Mr Brain Wo Contrast  Result Date: 01/01/2017 CLINICAL DATA:  Initial evaluation for acute slurred speech, progressive confusion. EXAM: MRI HEAD WITHOUT CONTRAST TECHNIQUE: Multiplanar, multiecho pulse sequences of the brain and surrounding structures were obtained without intravenous contrast. COMPARISON:  Prior CT from earlier the same day. FINDINGS: Brain: The study is limited as the patient was unable to tolerate the full length of the exam. Additionally, images provided are moderately degraded by motion artifact. Diffuse prominence of the CSF containing spaces is compatible with generalized age-related cerebral atrophy. Patchy T2/FLAIR hyperintensity within the periventricular and deep white matter both cerebral hemispheres most consistent with chronic microvascular ischemic disease, fairly advanced in nature. Chronic microvascular ischemic changes present within the pons. Remote bilateral cerebellar infarcts noted. Additional focus of cortical encephalomalacia within the right parietal lobe compatible with remote ischemic infarct as well. There is a punctate 5-6 mm focus of diffusion abnormality within the right periatrial white matter (series 3, image 31), suspicious for a small acute/ early subacute small vessel ischemic infarct. No associated mass effect. No definite associated hemorrhage,  although evaluation for blood products limited on this exam. No other abnormal foci of restricted diffusion to suggest acute or subacute ischemia. Gray-white matter differentiation otherwise maintained. No mass lesion, midline shift, or mass effect. Ventricular prominence related to global  parenchymal volume loss without hydrocephalus. No extra-axial fluid collection. Major dural sinuses are grossly patent. Vascular: Major intracranial vascular flow voids are maintained. Right vertebral artery hypoplastic. Skull and upper cervical spine: Craniocervical junction grossly unremarkable, although not well evaluated on this limited exam. Scalp soft tissues unremarkable. Calvarium intact. Bone marrow signal intensity grossly normal. Sinuses/Orbits: Globes and orbital soft tissues within normal limits. Paranasal sinuses are largely clear. Small bilateral mastoid effusions, left greater than right. Inner ear structures grossly normal. IMPRESSION: 1. Limited study due to patient's inability to tolerate the full length of the exam and motion artifact. 2. 5-6 mm focus of diffusion abnormality within the right periatrial white matter, suspicious for a small acute/early subacute small vessel ischemic infarct. 3. No other acute intracranial process identified. 4. Advanced cerebral atrophy with chronic microvascular ischemic disease and scattered remote ischemic infarcts as above. Electronically Signed   By: Jeannine Boga M.D.   On: 01/01/2017 23:17   Assessment/Plan :  Acute on Chronic Hypercapenic resp fx 2/2 Untreated OSA + OHS + Likely with CHFpEF + Sedating meds - trial with BiPaP 12-15/5-8 cm water and see if he will wake up. May use bipap at [facility] and prn. When he wakes up, likely, he will refuse BiPaP.  - suggest transfer to SDU - Keep o2 sats > 88% - avoid sedating meds - Family (son and daughter in law ) were updated re: pt's condition.  They stated pt is a DNR.    Subacute CVA - per  Neurology  Encephalopathy 2/2 Above + Azotemia - agree with gently hydrating for now - observe mental status with bipap    I spent  30  minutes of Critical Care time with this patient today. This is my time spent independent of the APP or resident.   Family :Family updated at length today.    PCCM will sign off for now.  Call back if with issues.    Monica Becton, MD 01/02/2017, 5:55 PM [facility] Pager [phone] After 3 pm or if no answer, call [phone]

## 2017-01-02 NOTE — Progress Notes (Signed)
PT Cancellation Note  Patient Details Name: TRENDYN ARRELLANO MRN: [id number] DOB: Sep 23, 1931   Cancelled Treatment:    Reason Eval/Treat Not Completed: Medical issues which prohibited therapy; per RN patient moving to step down unit due to respiratory status and lethargy.  Will attempt to see another day.    Reginia Naas 01/02/2017, 2:14 PM  Magda Kiel, [place] 01/02/2017

## 2017-01-02 NOTE — Consult Note (Signed)
   [facility] CM Inpatient Consult   01/02/2017  Jared Hall 1931/01/15 [id number]   Patient is currently active with [facility] for chronic disease management services.  Patient has been engaged by a [employer] and CSW.  Our community based plan of care has focused on disease management and community resource support.  Patient admitted from skilled facility.  Patient is currently transferring to [facility] unit.  Will follow up for progress and disposition needs, if appropriate with [facility].   For additional questions or referrals please contact:   Natividad Brood, RN BSN [facility] Liaison  [phone] business mobile phone Toll free office [phone]

## 2017-01-02 NOTE — Care Management Note (Signed)
Case Management Note  Patient Details  Name: Jared Hall MRN: [id number] Date of Birth: [birth year]  Subjective/Objective:            Patient was admitted with altered mental status, slurred speech. Brought via EMS from [facility].  CM will follow for discharge needs pending patient's progress and physician orders.         Action/Plan:   Expected Discharge Date:                  Expected Discharge Plan:     In-House Referral:     Discharge planning Services     Post Acute Care Choice:    Choice offered to:     DME Arranged:    DME Agency:     HH Arranged:    HH Agency:     Status of Service:     If discussed at [employer] of Stay Meetings, dates discussed:    Additional Comments:  Rolm Baptise, RN 01/02/2017, 9:44 AM

## 2017-01-02 NOTE — Progress Notes (Signed)
Pt is unavailable for an EEG. Pt is transferring to stepdown soon. Will attempt later as schedule premits

## 2017-01-02 NOTE — Consult Note (Signed)
Requesting Physician: Dr. Tyrell Antonio    Chief Complaint: Stroke  History obtained from:  Patient     HPI:                                                                                                                                         AMBUS BOCOCK is an 83 y.o. male with medical history significant of hypertension, hyperlipidemia, chronic kidney disease stage III, prior CVA, CHF with last EF of 55% April 2016, diabetes mellitus, hyperlipidemia who was recently hospitalized from 12/05/2016- 12/15/2016 for bilateral lower extremity cellulitis and edema, acute hypoxic respiratory failure, who presents to the ED from nursing facility at [facility] with a four-day history of slurred speech, progressive confusion.  MRI obtained in ED showing acute to subacute stroke and neurology consulted. patient is on ASA and Plavix.  Currently patient is very lethargic, show significant dysarthria, is able to follow simple commands but not complex commands. He appears to be very encephalopathic.  Date last known well: Date: 12/30/2016 Time last known well: Unable to determine tPA Given: No: out of the window   Past Medical History:  Diagnosis Date  . Alpha thalassemia ([place])   . Anemia 01/01/2017  . BPH (benign prostatic hyperplasia)   . CHF (congestive heart failure) ([place])   . High cholesterol   . Hypertension   . Lower extremity edema   . Onychomycosis   . Prostate cancer ([place])    S/P "8 weeks of radiation"  . Prostatitis    recurrent  . Sleep apnea   . Stroke [facility]) ~ 2005   denies residual on 2/123/2015  . Type II diabetes mellitus ([place])   . Umbilical hernia   . Urinary urgency    with incontinence    Past Surgical History:  Procedure Laterality Date  . ADENOIDECTOMY    . HERNIA REPAIR    . MASTOIDECTOMY    . TONSILLECTOMY    . VASECTOMY    . VENTRAL HERNIA REPAIR  12/18/2011   Procedure: HERNIA REPAIR VENTRAL ADULT;  Surgeon: Adin Hector, MD;  Location: [place];  Service:  General;  Laterality: N/A;    Family History  Problem Relation Age of Onset  . Pneumonia Mother   . Heart attack Father    Social History:  reports that he has never smoked. He has never used smokeless tobacco. He reports that he does not drink alcohol or use drugs.  Allergies:  Allergies  Allergen Reactions  . Ativan [Lorazepam] Other (See Comments)    Flailing of extremities noted immediately s/p IV administration.  [name] Kitchen Flomax [Tamsulosin Hcl] Other (See Comments)    Excessive tearing  . Glucophage [Metformin Hcl] Diarrhea    Medications:  Scheduled: . aspirin  300 mg Rectal Daily   Or  . aspirin  325 mg Oral Daily  . clopidogrel  75 mg Oral Daily  . doxycycline  100 mg Oral Q12H  . enoxaparin (LOVENOX) injection  40 mg Subcutaneous Q24H  . fluticasone  1 spray Each Nare Daily  . insulin aspart  0-9 Units Subcutaneous TID WC  . pantoprazole  40 mg Oral Daily  . simvastatin  20 mg Oral QHS  . sodium chloride  500 mL Intravenous Once  . vitamin B-12  1,000 mcg Oral Daily    ROS:                                                                                                                                       History obtained from the patient  General ROS: negative for - chills, fatigue, fever, night sweats, weight gain or weight loss Psychological ROS: negative for - behavioral disorder, hallucinations, memory difficulties, mood swings or suicidal ideation Ophthalmic ROS: negative for - blurry vision, double vision, eye pain or loss of vision ENT ROS: negative for - epistaxis, nasal discharge, oral lesions, sore throat, tinnitus or vertigo Allergy and Immunology ROS: negative for - hives or itchy/watery eyes Hematological and Lymphatic ROS: negative for - bleeding problems, bruising or swollen lymph nodes Endocrine ROS: negative for - galactorrhea,  hair pattern changes, polydipsia/polyuria or temperature intolerance Respiratory ROS: negative for - cough, hemoptysis, shortness of breath or wheezing Cardiovascular ROS: negative for - chest pain, dyspnea on exertion, edema or irregular heartbeat Gastrointestinal ROS: negative for - abdominal pain, diarrhea, hematemesis, nausea/vomiting or stool incontinence Genito-Urinary ROS: negative for - dysuria, hematuria, incontinence or urinary frequency/urgency Musculoskeletal ROS: negative for - joint swelling or muscular weakness Neurological ROS: as noted in HPI Dermatological ROS: negative for rash and skin lesion changes  Neurologic Examination:                                                                                                      Blood pressure (!) 151/67, pulse 87, temperature 97.7 F (36.5 C), temperature source Oral, resp. rate 18, height 5\' 11"  (1.803 m), weight 118.5 kg (261 lb 4.8 oz), SpO2 96 %.  HEENT-  Normocephalic, no lesions, without obvious abnormality.  Normal external eye and conjunctiva.  Normal TM's bilaterally.  Normal auditory canals and external ears. Normal external nose, mucus membranes and septum.  Normal pharynx. Cardiovascular- S1, S2 normal, pulses palpable throughout   Lungs- chest clear,  no wheezing, rales, normal symmetric air entry Abdomen- normal findings: bowel sounds normal Extremities- no edema Lymph-no adenopathy palpable Musculoskeletal-no joint tenderness, deformity or swelling Skin-warm and dry, no hyperpigmentation, vitiligo, or suspicious lesions  Neurological Examination Mental Status: Alert, oriented, thought content appropriate.  Speech fluent without evidence of aphasia.  Able to follow 3 step commands without difficulty. Cranial Nerves: II: Discs flat bilaterally; Visual fields grossly normal,  III,IV, VI: ptosis not present, extra-ocular motions intact bilaterally, pupils equal, round, reactive to light and accommodation V,VII:  smile symmetric, facial light touch sensation normal bilaterally VIII: hearing normal bilaterally IX,X: uvula rises symmetrically XI: bilateral shoulder shrug XII: midline tongue extension Motor: Right : Upper extremity   5/5    Left:     Upper extremity   5/5  Lower extremity   5/5     Lower extremity   5/5 Tone and bulk:normal tone throughout; no atrophy noted Sensory: Pinprick and light touch intact throughout, bilaterally Deep Tendon Reflexes: 2+ and symmetric throughout Plantars: Right: downgoing   Left: downgoing Cerebellar: normal finger-to-nose, normal rapid alternating movements and normal heel-to-shin test Gait: normal gait and station       Lab Results: Basic Metabolic Panel:  Recent Labs Lab 01/01/17 1507 01/02/17 0237  NA 141 143  K 4.0 3.5  CL 97* 98*  CO2 37* 35*  GLUCOSE 110* 84  BUN 18 15  CREATININE 1.77* 1.45*  CALCIUM 9.1 8.8*    Liver Function Tests:  Recent Labs Lab 01/01/17 1507  AST 24  ALT 19  ALKPHOS 68  BILITOT 0.7  PROT 5.9*  ALBUMIN 2.9*   No results for input(s): LIPASE, AMYLASE in the last 168 hours.  Recent Labs Lab 01/01/17 1507  AMMONIA 19    CBC:  Recent Labs Lab 01/01/17 1507 01/02/17 0237  WBC 9.5 8.3  NEUTROABS 6.7  --   HGB 10.6* 10.3*  HCT 35.6* 34.9*  MCV 69.4* 70.2*  PLT 164 151    Cardiac Enzymes: No results for input(s): CKTOTAL, CKMB, CKMBINDEX, TROPONINI in the last 168 hours.  Lipid Panel:  Recent Labs Lab 01/02/17 0237  CHOL 130  TRIG 130  HDL 28*  CHOLHDL 4.6  VLDL 26  LDLCALC 76    CBG:  Recent Labs Lab 01/02/17 0008 01/02/17 0208 01/02/17 0409 01/02/17 0617 01/02/17 1124  GLUCAP 94 66 86 100* 105*    Microbiology: Results for orders placed or performed during the hospital encounter of 12/05/16  Blood culture (routine x 2)     Status: None   Collection Time: 12/05/16  5:45 PM  Result Value Ref Range Status   Specimen Description BLOOD RIGHT ARM  Final   Special  Requests IN PEDIATRIC BOTTLE 5CC  Final   Culture   Final    NO GROWTH 5 DAYS Performed at [facility], [place] [address], [place], [place] [address]    Report Status 12/10/2016 FINAL  Final  Blood culture (routine x 2)     Status: None   Collection Time: 12/05/16  5:45 PM  Result Value Ref Range Status   Specimen Description RIGHT ANTECUBITAL  Final   Special Requests BOTTLES DRAWN AEROBIC AND ANAEROBIC 5CC  Final   Culture   Final    NO GROWTH 5 DAYS Performed at [facility], [place] [address], [place], [place] [address]    Report Status 12/10/2016 FINAL  Final  Wound or Superficial Culture     Status: None   Collection Time:  12/05/16  7:20 PM  Result Value Ref Range Status   Specimen Description LEG RIGHT  Final   Special Requests NONE  Final   Gram Stain   Final    FEW WBC PRESENT, PREDOMINANTLY PMN NO ORGANISMS SEEN Performed at [facility], [address] [place], [place] [address]    Culture   Final    ABUNDANT PSEUDOMONAS AERUGINOSA FEW PROVIDENCIA RETTGERI    Report Status 12/10/2016 FINAL  Final   Organism ID, Bacteria PSEUDOMONAS AERUGINOSA  Final   Organism ID, Bacteria PROVIDENCIA RETTGERI  Final      Susceptibility   Pseudomonas aeruginosa - MIC*    CEFTAZIDIME 4 SENSITIVE Sensitive     CIPROFLOXACIN <=0.25 SENSITIVE Sensitive     GENTAMICIN <=1 SENSITIVE Sensitive     IMIPENEM 1 SENSITIVE Sensitive     PIP/TAZO 8 SENSITIVE Sensitive     CEFEPIME 4 SENSITIVE Sensitive     * ABUNDANT PSEUDOMONAS AERUGINOSA   Providencia rettgeri - MIC*    AMPICILLIN >=32 RESISTANT Resistant     CEFAZOLIN >=64 RESISTANT Resistant     CEFEPIME <=1 SENSITIVE Sensitive     CEFTAZIDIME <=1 SENSITIVE Sensitive     CEFTRIAXONE <=1 SENSITIVE Sensitive     CIPROFLOXACIN <=0.25 SENSITIVE Sensitive     GENTAMICIN 2 SENSITIVE Sensitive     IMIPENEM 2 SENSITIVE Sensitive     TRIMETH/SULFA <=20 SENSITIVE Sensitive     AMPICILLIN/SULBACTAM >=32 RESISTANT Resistant      PIP/TAZO <=4 SENSITIVE Sensitive     * FEW PROVIDENCIA RETTGERI    Coagulation Studies: No results for input(s): LABPROT, INR in the last 72 hours.  Imaging: Dg Chest 2 View  Result Date: 01/01/2017 CLINICAL DATA:  Altered mental status EXAM: CHEST  2 VIEW COMPARISON:  12/10/2016 FINDINGS: Cardiomegaly again noted. Stable chronic elevation of left hemidiaphragm and prominent fat pad in left cardiophrenic angle. There is linear atelectasis or infiltrate in lingula. No pulmonary edema. Right lung is clear IMPRESSION: Stable chronic elevation of left hemidiaphragm and prominent fat pad in left cardiophrenic angle. There is linear atelectasis or infiltrate in lingula. No pulmonary edema. Right lung is clear Electronically Signed   By: Lahoma Crocker M.D.   On: 01/01/2017 16:25   Ct Head Wo Contrast  Result Date: 01/01/2017 CLINICAL DATA:  Pt has very altered mental status talking in undistinguished words and wavings arms around EXAM: CT HEAD WITHOUT CONTRAST CT CERVICAL SPINE WITHOUT CONTRAST TECHNIQUE: Multidetector CT imaging of the head and cervical spine was performed following the standard protocol without intravenous contrast. Multiplanar CT image reconstructions of the cervical spine were also generated. COMPARISON:  03/14/2015 and 03/15/2015 FINDINGS: CT HEAD FINDINGS Brain: There is moderate central and cortical atrophy. Periventricular white matter changes are consistent with small vessel disease. Stable appearance of subdural hygroma in the posterior fossa. Remote right cerebellar infarct. There is no intra or extra-axial mass lesion. The basilar cisterns and ventricles have a normal appearance. There is no CT evidence for acute infarction or hemorrhage. Vascular: There is atherosclerotic calcification of the carotid siphons. Skull: No calvarial fracture. Sinuses/Orbits: No acute finding. Other: None CT CERVICAL SPINE FINDINGS Alignment: There is significant degenerative changes in mid and lower  cervical spine. Degenerative anterolisthesis of C5 on C6 measures approximately 4 mm. Skull base and vertebrae: No acute fracture. No primary bone lesion or focal pathologic process. Soft tissues and spinal canal: No prevertebral fluid or swelling. No visible canal hematoma. Disc levels: Significant disc height loss  throughout the cervical spine. Bilateral foraminal narrowing at C3-4. Bilateral foraminal narrowing at C4-5. Bilateral foraminal narrowing at C6-7. Significant facet hypertrophy at all levels. Upper chest: Unremarkable. Other: Study quality is degraded by patient motion artifact. IMPRESSION: 1. Atrophy and small vessel disease. 2. 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 3. Remote lacunar infarct of the right cerebellar hemisphere. 4.  No evidence for acute CT abnormality. 5. Significant cervical spine degenerative changes without evidence for acute abnormality. Electronically Signed   By: Nolon Nations M.D.   On: 01/01/2017 16:10   Ct Cervical Spine Wo Contrast  Result Date: 01/01/2017 CLINICAL DATA:  Pt has very altered mental status talking in undistinguished words and wavings arms around EXAM: CT HEAD WITHOUT CONTRAST CT CERVICAL SPINE WITHOUT CONTRAST TECHNIQUE: Multidetector CT imaging of the head and cervical spine was performed following the standard protocol without intravenous contrast. Multiplanar CT image reconstructions of the cervical spine were also generated. COMPARISON:  03/14/2015 and 03/15/2015 FINDINGS: CT HEAD FINDINGS Brain: There is moderate central and cortical atrophy. Periventricular white matter changes are consistent with small vessel disease. Stable appearance of subdural hygroma in the posterior fossa. Remote right cerebellar infarct. There is no intra or extra-axial mass lesion. The basilar cisterns and ventricles have a normal appearance. There is no CT evidence for acute infarction or hemorrhage. Vascular: There is atherosclerotic calcification of the  carotid siphons. Skull: No calvarial fracture. Sinuses/Orbits: No acute finding. Other: None CT CERVICAL SPINE FINDINGS Alignment: There is significant degenerative changes in mid and lower cervical spine. Degenerative anterolisthesis of C5 on C6 measures approximately 4 mm. Skull base and vertebrae: No acute fracture. No primary bone lesion or focal pathologic process. Soft tissues and spinal canal: No prevertebral fluid or swelling. No visible canal hematoma. Disc levels: Significant disc height loss throughout the cervical spine. Bilateral foraminal narrowing at C3-4. Bilateral foraminal narrowing at C4-5. Bilateral foraminal narrowing at C6-7. Significant facet hypertrophy at all levels. Upper chest: Unremarkable. Other: Study quality is degraded by patient motion artifact. IMPRESSION: 1. Atrophy and small vessel disease. 2. 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 3. Remote lacunar infarct of the right cerebellar hemisphere. 4.  No evidence for acute CT abnormality. 5. Significant cervical spine degenerative changes without evidence for acute abnormality. Electronically Signed   By: Nolon Nations M.D.   On: 01/01/2017 16:10   Mr Brain Wo Contrast  Result Date: 01/01/2017 CLINICAL DATA:  Initial evaluation for acute slurred speech, progressive confusion. EXAM: MRI HEAD WITHOUT CONTRAST TECHNIQUE: Multiplanar, multiecho pulse sequences of the brain and surrounding structures were obtained without intravenous contrast. COMPARISON:  Prior CT from earlier the same day. FINDINGS: Brain: The study is limited as the patient was unable to tolerate the full length of the exam. Additionally, images provided are moderately degraded by motion artifact. Diffuse prominence of the CSF containing spaces is compatible with generalized age-related cerebral atrophy. Patchy T2/FLAIR hyperintensity within the periventricular and deep white matter both cerebral hemispheres most consistent with chronic microvascular  ischemic disease, fairly advanced in nature. Chronic microvascular ischemic changes present within the pons. Remote bilateral cerebellar infarcts noted. Additional focus of cortical encephalomalacia within the right parietal lobe compatible with remote ischemic infarct as well. There is a punctate 5-6 mm focus of diffusion abnormality within the right periatrial white matter (series 3, image 31), suspicious for a small acute/ early subacute small vessel ischemic infarct. No associated mass effect. No definite associated hemorrhage, although evaluation for blood products limited on this exam.  No other abnormal foci of restricted diffusion to suggest acute or subacute ischemia. Gray-white matter differentiation otherwise maintained. No mass lesion, midline shift, or mass effect. Ventricular prominence related to global parenchymal volume loss without hydrocephalus. No extra-axial fluid collection. Major dural sinuses are grossly patent. Vascular: Major intracranial vascular flow voids are maintained. Right vertebral artery hypoplastic. Skull and upper cervical spine: Craniocervical junction grossly unremarkable, although not well evaluated on this limited exam. Scalp soft tissues unremarkable. Calvarium intact. Bone marrow signal intensity grossly normal. Sinuses/Orbits: Globes and orbital soft tissues within normal limits. Paranasal sinuses are largely clear. Small bilateral mastoid effusions, left greater than right. Inner ear structures grossly normal. IMPRESSION: 1. Limited study due to patient's inability to tolerate the full length of the exam and motion artifact. 2. 5-6 mm focus of diffusion abnormality within the right periatrial white matter, suspicious for a small acute/early subacute small vessel ischemic infarct. 3. No other acute intracranial process identified. 4. Advanced cerebral atrophy with chronic microvascular ischemic disease and scattered remote ischemic infarcts as above. Electronically Signed    By: Jeannine Boga M.D.   On: 01/01/2017 23:17       Assessment and plan discussed with with attending physician and they are in agreement.    Etta Quill PA-C [facility] [phone]  01/02/2017, 1:08 PM   Assessment: 83 y.o. male presenting to the hospital with altered mental status, 4 day history of slurred speech and progressive confusion. MRI was obtained but was limited due to patient's inability to tolerate full length of the exam. There was a 5-6 mm focus of diffusion impairment with the within the right parietal white matter. At this time is likely this is not the full etiology of his confusion and dysarthria as this is a very small infarct. Patient will need further evaluation for metabolic /toxic etiology.  Stroke Risk Factors - diabetes mellitus, hyperlipidemia and hypertension

## 2017-01-02 NOTE — Progress Notes (Signed)
Respiratory therapy called to report abnormal ABG result  Results for Jared Hall, Jared Hall (MRN [id number]) as of 01/02/2017 12:21  Ref. Range 01/02/2017 11:55  Sample type Unknown ARTERIAL DRAW  O2 Content Latest Units: L/min 2.0  pH, Arterial Latest Ref Range: 7.350 - 7.450  7.349 (L)  pCO2 arterial Latest Ref Range: 32.0 - 48.0 mmHg 68.4 (HH)  pO2, Arterial Latest Ref Range: 83.0 - 108.0 mmHg 93.9  Acid-Base Excess Latest Ref Range: 0.0 - 2.0 mmol/L 10.8 (H)  Bicarbonate Latest Ref Range: 20.0 - 28.0 mmol/L 36.6 (H)  O2 Saturation Latest Units: % 96.3  Patient temperature Unknown 98.6  Collection site Unknown LEFT RADIAL  Allens test (pass/fail) Latest Ref Range: PASS  PASS  MD page notified. Will continue to monitor.

## 2017-01-03 ENCOUNTER — Inpatient Hospital Stay (HOSPITAL_COMMUNITY): Payer: Medicare Other

## 2017-01-03 DIAGNOSIS — I63311 Cerebral infarction due to thrombosis of right middle cerebral artery: Secondary | ICD-10-CM

## 2017-01-03 LAB — BASIC METABOLIC PANEL
ANION GAP: 12 (ref 5–15)
BUN: 10 mg/dL (ref 6–20)
CO2: 31 mmol/L (ref 22–32)
Calcium: 8.7 mg/dL — ABNORMAL LOW (ref 8.9–10.3)
Chloride: 98 mmol/L — ABNORMAL LOW (ref 101–111)
Creatinine, Ser: 1.08 mg/dL (ref 0.61–1.24)
GFR calc Af Amer: >60 mL/min (ref >60)
GFR calc non Af Amer: >60 mL/min (ref >60)
GLUCOSE: 144 mg/dL — AB (ref 65–99)
POTASSIUM: 3.6 mmol/L (ref 3.5–5.1)
Sodium: 141 mmol/L (ref 135–145)

## 2017-01-03 LAB — GLUCOSE, CAPILLARY
GLUCOSE-CAPILLARY: 152 mg/dL — AB (ref 65–99)
GLUCOSE-CAPILLARY: 165 mg/dL — AB (ref 65–99)
Glucose-Capillary: 137 mg/dL — ABNORMAL HIGH (ref 65–99)
Glucose-Capillary: 178 mg/dL — ABNORMAL HIGH (ref 65–99)
Glucose-Capillary: 191 mg/dL — ABNORMAL HIGH (ref 65–99)

## 2017-01-03 LAB — HEMOGLOBIN A1C
Hgb A1c MFr Bld: 8.6 % — ABNORMAL HIGH (ref 4.8–5.6)
Mean Plasma Glucose: 200 mg/dL

## 2017-01-03 LAB — URINE CULTURE: CULTURE: NO GROWTH

## 2017-01-03 LAB — CBC
HEMATOCRIT: 37.3 % — AB (ref 39.0–52.0)
Hemoglobin: 11.1 g/dL — ABNORMAL LOW (ref 13.0–17.0)
MCH: 20.9 pg — ABNORMAL LOW (ref 26.0–34.0)
MCHC: 29.8 g/dL — ABNORMAL LOW (ref 30.0–36.0)
MCV: 70.2 fL — AB (ref 78.0–100.0)
Platelets: 136 10*3/uL — ABNORMAL LOW (ref 150–400)
RBC: 5.31 MIL/uL (ref 4.22–5.81)
RDW: 15 % (ref 11.5–15.5)
WBC: 10.3 10*3/uL (ref 4.0–10.5)

## 2017-01-03 MED ORDER — RESOURCE THICKENUP CLEAR PO POWD
ORAL | Status: DC | PRN
  Filled 2017-01-03: qty 125

## 2017-01-03 MED ORDER — CEFEPIME HCL 2 G IJ SOLR
2.0000 g | Freq: Two times a day (BID) | INTRAMUSCULAR | Status: DC
  Administered 2017-01-03 – 2017-01-04 (×3): 2 g via INTRAVENOUS
  Filled 2017-01-03 (×3): qty 2

## 2017-01-03 MED ORDER — IOPAMIDOL (ISOVUE-370) INJECTION 76%
INTRAVENOUS | Status: AC
  Filled 2017-01-03: qty 50

## 2017-01-03 MED ORDER — ENOXAPARIN SODIUM 60 MG/0.6ML ~~LOC~~ SOLN
60.0000 mg | SUBCUTANEOUS | Status: DC
  Administered 2017-01-03 – 2017-01-04 (×2): 60 mg via SUBCUTANEOUS
  Filled 2017-01-03 (×2): qty 0.6

## 2017-01-03 NOTE — Progress Notes (Signed)
PROGRESS NOTE    Jared Hall  [id number] DOB: 1931-01-01 DOA: 01/01/2017 PCP: Henrine Screws, MD   Brief Narrative: Jared Hall is a 83 y.o. male with medical history significant of hypertension, hyperlipidemia, chronic kidney disease stage III, prior CVA, CHF with last EF of 55% April 2016, diabetes mellitus, hyperlipidemia who was recently hospitalized from 12/05/2016- 12/15/2016 for bilateral lower extremity cellulitis and edema, acute hypoxic respiratory failure, who presents to the ED from nursing facility at [facility] with a four-day history of slurred speech, progressive confusion. Patient with slurred dysarthric speech with an expressive aphasia and a such history obtained from ED PA and from the chart. Per chart in ED PA it was noted that patient had had a four-day history of altered mental status with slurred speech and also noted to have a left facial droop. ED RN spoke with staff at the nursing home it was noticed that patient began to be more confused on 12/29/2016. The morning of admission patient was noted to be more confused and had multiple falls and only oriented to self. Patient subsequently sent to the ED.   Assessment & Plan:   Principal Problem:   CVA (cerebral vascular accident) ([place]) Active Problems:   History of CVA (cerebrovascular accident)   Dyslipidemia   AKI (acute kidney injury) ([place])   Essential hypertension, benign   Type 1 diabetes mellitus with peripheral circulatory complications (HCC)   Depression   GERD (gastroesophageal reflux disease)   Diabetic neuropathy (HCC)   Venous stasis dermatitis   DM type 2 with diabetic peripheral neuropathy (HCC)   Paroxysmal atrial fibrillation (HCC)   Chronic kidney disease, stage III (moderate)   Morbid obesity (HCC)   Dehydration   Anemia   Altered mental status   Acute respiratory failure with hypercapnia ([place])    1-Acute encephalopathy;  This could be multifactorial, secondary to medications, Vs  stroke vs hypercapnia.  MRI showed stroke. Neurology consulted. Neurology doesn't think that MRI finding explain patient mental status.  Holding sedative; gabapentin, amitryptiline, bupropion.  EEG ordered. Unable to be perform, patient not cooperative with test  Ammonia normal at 19.  UA negative, Chest x ray ; atelectasis vs infiltrates lingula. Afebrile, normal WBC.  Check blood culture.  More alert today.   2-Acute on chronic hypercapnic respiratory failure.  Transfer t step down unit.  CCM consulted. BIPAP ordered. Patient refuse BIPAP/  More alert today   3-HTN; permissive HTN in setting of stroke.   4-LE cellulitis; started doxy.  Check doppler.  Add cefepime, prior wound  culture grew pseudomonas.   5-DM; SSI. Hold lantus.  Hypoglycemia; started on d 5. Will discontinue d 5 IV fluids. Started on diet   Right parietal white matter sub acute stroke;  LDL 76 On aspirin and plavix.  HB;A1c; 8.6 Carotid doppler; limited, no significant stenosis.  Neurology following   Acute kidney injury on chronic kidney disease stage III Cr peak to 1.7. Improved with IV fluids.  Cr has decreased to 1.0.  history of PSVT/paroxysmal A. fib Patient currently rate controlled. Patient used to be on eliquis which was reportedly discontinued and patient on Plavix.    DVT prophylaxis: Lovenox Code Status: DNR  Family Communication:  Disposition Plan: transfer to step down unit.    Consultants:   CCM  Neurology    Procedures:   ECHO  Doppler.    Antimicrobials:   Doxy   Subjective: He is more alert, denies abdominal pain.  Speech is more clear.  Objective: Vitals:   01/02/17 1955 01/03/17 0015 01/03/17 0348 01/03/17 0405  BP: 134/70 (!) 154/135 (!) 157/124 (!) 156/76  Pulse: 93 96 100 97  Resp:  (!) 27 (!) 26 (!) 26  Temp: 98.2 F (36.8 C) 97.9 F (36.6 C) 98.7 F (37.1 C)   TempSrc: Oral Oral Oral   SpO2: 93% 97% 96% 96%  Weight:      Height:         Intake/Output Summary (Last 24 hours) at 01/03/17 0802 Last data filed at 01/03/17 0400  Gross per 24 hour  Intake             1885 ml  Output              675 ml  Net             1210 ml   Filed Weights   01/01/17 1422 01/01/17 2030  Weight: 119.7 kg (264 lb) 118.5 kg (261 lb 4.8 oz)    Examination:  General exam: Appears calm., Alert  Respiratory system: bilateral ronchus . Respiratory effort normal. Cardiovascular system: S1 & S2 heard, RRR. No JVD, murmurs, rubs, gallops or clicks. No pedal edema. Gastrointestinal system: Abdomen is nondistended, soft and nontender. No organomegaly or masses felt. Normal bowel sounds heard. Central nervous system: Alert and oriented. No focal neurological deficits. Extremities: trace edema., Skin: bilateral lower extremity with erythema.     Data Reviewed: I have personally reviewed following labs and imaging studies  CBC:  Recent Labs Lab 01/01/17 1507 01/02/17 0237 01/03/17 0157  WBC 9.5 8.3 10.3  NEUTROABS 6.7  --   --   HGB 10.6* 10.3* 11.1*  HCT 35.6* 34.9* 37.3*  MCV 69.4* 70.2* 70.2*  PLT 164 151 [id number]*   Basic Metabolic Panel:  Recent Labs Lab 01/01/17 1507 01/02/17 0237 01/03/17 0157  NA 141 143 141  K 4.0 3.5 3.6  CL 97* 98* 98*  CO2 37* 35* 31  GLUCOSE 110* 84 144*  BUN 18 15 10   CREATININE 1.77* 1.45* 1.08  CALCIUM 9.1 8.8* 8.7*   GFR: Estimated Creatinine Clearance: 67.9 mL/min (by C-G formula based on SCr of 1.08 mg/dL). Liver Function Tests:  Recent Labs Lab 01/01/17 1507  AST 24  ALT 19  ALKPHOS 68  BILITOT 0.7  PROT 5.9*  ALBUMIN 2.9*   No results for input(s): LIPASE, AMYLASE in the last 168 hours.  Recent Labs Lab 01/01/17 1507  AMMONIA 19   Coagulation Profile: No results for input(s): INR, PROTIME in the last 168 hours. Cardiac Enzymes: No results for input(s): CKTOTAL, CKMB, CKMBINDEX, TROPONINI in the last 168 hours. BNP (last 3 results) No results for input(s): PROBNP in  the last 8760 hours. HbA1C:  Recent Labs  01/02/17 0237  HGBA1C 8.6*   CBG:  Recent Labs Lab 01/02/17 1124 01/02/17 1614 01/02/17 1953 01/03/17 0019 01/03/17 0752  GLUCAP 105* 104* 101* 137* 178*   Lipid Profile:  Recent Labs  01/02/17 0237  CHOL 130  HDL 28*  LDLCALC 76  TRIG 130  CHOLHDL 4.6   Thyroid Function Tests: No results for input(s): TSH, T4TOTAL, FREET4, T3FREE, THYROIDAB in the last 72 hours. Anemia Panel:  Recent Labs  01/02/17 0237  VITAMINB12 1,000*  FOLATE 18.7  FERRITIN 108  TIBC 248*  IRON 47   Sepsis Labs:  Recent Labs Lab 01/01/17 1650  LATICACIDVEN 0.88    Recent Results (from the past 240 hour(s))  MRSA PCR Screening  Status: Abnormal   Collection Time: 01/02/17  2:42 PM  Result Value Ref Range Status   MRSA by PCR POSITIVE (A) NEGATIVE Final    Comment:        The GeneXpert MRSA Assay (FDA approved for NASAL specimens only), is one component of a comprehensive MRSA colonization surveillance program. It is not intended to diagnose MRSA infection nor to guide or monitor treatment for MRSA infections. RESULT CALLED TO, READ BACK BY AND VERIFIED WITH: D.EVERETTE,RN AT 1932 BY L.PITT 01/02/17          Radiology Studies: Dg Chest 2 View  Result Date: 01/01/2017 CLINICAL DATA:  Altered mental status EXAM: CHEST  2 VIEW COMPARISON:  12/10/2016 FINDINGS: Cardiomegaly again noted. Stable chronic elevation of left hemidiaphragm and prominent fat pad in left cardiophrenic angle. There is linear atelectasis or infiltrate in lingula. No pulmonary edema. Right lung is clear IMPRESSION: Stable chronic elevation of left hemidiaphragm and prominent fat pad in left cardiophrenic angle. There is linear atelectasis or infiltrate in lingula. No pulmonary edema. Right lung is clear Electronically Signed   By: Lahoma Crocker M.D.   On: 01/01/2017 16:25   Ct Head Wo Contrast  Result Date: 01/01/2017 CLINICAL DATA:  Pt has very altered  mental status talking in undistinguished words and wavings arms around EXAM: CT HEAD WITHOUT CONTRAST CT CERVICAL SPINE WITHOUT CONTRAST TECHNIQUE: Multidetector CT imaging of the head and cervical spine was performed following the standard protocol without intravenous contrast. Multiplanar CT image reconstructions of the cervical spine were also generated. COMPARISON:  03/14/2015 and 03/15/2015 FINDINGS: CT HEAD FINDINGS Brain: There is moderate central and cortical atrophy. Periventricular white matter changes are consistent with small vessel disease. Stable appearance of subdural hygroma in the posterior fossa. Remote right cerebellar infarct. There is no intra or extra-axial mass lesion. The basilar cisterns and ventricles have a normal appearance. There is no CT evidence for acute infarction or hemorrhage. Vascular: There is atherosclerotic calcification of the carotid siphons. Skull: No calvarial fracture. Sinuses/Orbits: No acute finding. Other: None CT CERVICAL SPINE FINDINGS Alignment: There is significant degenerative changes in mid and lower cervical spine. Degenerative anterolisthesis of C5 on C6 measures approximately 4 mm. Skull base and vertebrae: No acute fracture. No primary bone lesion or focal pathologic process. Soft tissues and spinal canal: No prevertebral fluid or swelling. No visible canal hematoma. Disc levels: Significant disc height loss throughout the cervical spine. Bilateral foraminal narrowing at C3-4. Bilateral foraminal narrowing at C4-5. Bilateral foraminal narrowing at C6-7. Significant facet hypertrophy at all levels. Upper chest: Unremarkable. Other: Study quality is degraded by patient motion artifact. IMPRESSION: 1. Atrophy and small vessel disease. 2. 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 3. Remote lacunar infarct of the right cerebellar hemisphere. 4.  No evidence for acute CT abnormality. 5. Significant cervical spine degenerative changes without evidence for acute  abnormality. Electronically Signed   By: Nolon Nations M.D.   On: 01/01/2017 16:10   Ct Cervical Spine Wo Contrast  Result Date: 01/01/2017 CLINICAL DATA:  Pt has very altered mental status talking in undistinguished words and wavings arms around EXAM: CT HEAD WITHOUT CONTRAST CT CERVICAL SPINE WITHOUT CONTRAST TECHNIQUE: Multidetector CT imaging of the head and cervical spine was performed following the standard protocol without intravenous contrast. Multiplanar CT image reconstructions of the cervical spine were also generated. COMPARISON:  03/14/2015 and 03/15/2015 FINDINGS: CT HEAD FINDINGS Brain: There is moderate central and cortical atrophy. Periventricular white matter changes are consistent with  small vessel disease. Stable appearance of subdural hygroma in the posterior fossa. Remote right cerebellar infarct. There is no intra or extra-axial mass lesion. The basilar cisterns and ventricles have a normal appearance. There is no CT evidence for acute infarction or hemorrhage. Vascular: There is atherosclerotic calcification of the carotid siphons. Skull: No calvarial fracture. Sinuses/Orbits: No acute finding. Other: None CT CERVICAL SPINE FINDINGS Alignment: There is significant degenerative changes in mid and lower cervical spine. Degenerative anterolisthesis of C5 on C6 measures approximately 4 mm. Skull base and vertebrae: No acute fracture. No primary bone lesion or focal pathologic process. Soft tissues and spinal canal: No prevertebral fluid or swelling. No visible canal hematoma. Disc levels: Significant disc height loss throughout the cervical spine. Bilateral foraminal narrowing at C3-4. Bilateral foraminal narrowing at C4-5. Bilateral foraminal narrowing at C6-7. Significant facet hypertrophy at all levels. Upper chest: Unremarkable. Other: Study quality is degraded by patient motion artifact. IMPRESSION: 1. Atrophy and small vessel disease. 2. Stable subdural hygroma of the posterior  fossa. 3. Remote lacunar infarct of the right cerebellar hemisphere. 4.  No evidence for acute CT abnormality. 5. Significant cervical spine degenerative changes without evidence for acute abnormality. Electronically Signed   By: Nolon Nations M.D.   On: 01/01/2017 16:10   Mr Brain Wo Contrast  Result Date: 01/01/2017 CLINICAL DATA:  Initial evaluation for acute slurred speech, progressive confusion. EXAM: MRI HEAD WITHOUT CONTRAST TECHNIQUE: Multiplanar, multiecho pulse sequences of the brain and surrounding structures were obtained without intravenous contrast. COMPARISON:  Prior CT from earlier the same day. FINDINGS: Brain: The study is limited as the patient was unable to tolerate the full length of the exam. Additionally, images provided are moderately degraded by motion artifact. Diffuse prominence of the CSF containing spaces is compatible with generalized age-related cerebral atrophy. Patchy T2/FLAIR hyperintensity within the periventricular and deep white matter both cerebral hemispheres most consistent with chronic microvascular ischemic disease, fairly advanced in nature. Chronic microvascular ischemic changes present within the pons. Remote bilateral cerebellar infarcts noted. Additional focus of cortical encephalomalacia within the right parietal lobe compatible with remote ischemic infarct as well. There is a punctate 5-6 mm focus of diffusion abnormality within the right periatrial white matter (series 3, image 31), suspicious for a small acute/ early subacute small vessel ischemic infarct. No associated mass effect. No definite associated hemorrhage, although evaluation for blood products limited on this exam. No other abnormal foci of restricted diffusion to suggest acute or subacute ischemia. Gray-white matter differentiation otherwise maintained. No mass lesion, midline shift, or mass effect. Ventricular prominence related to global parenchymal volume loss without hydrocephalus. No  extra-axial fluid collection. Major dural sinuses are grossly patent. Vascular: Major intracranial vascular flow voids are maintained. Right vertebral artery hypoplastic. Skull and upper cervical spine: Craniocervical junction grossly unremarkable, although not well evaluated on this limited exam. Scalp soft tissues unremarkable. Calvarium intact. Bone marrow signal intensity grossly normal. Sinuses/Orbits: Globes and orbital soft tissues within normal limits. Paranasal sinuses are largely clear. Small bilateral mastoid effusions, left greater than right. Inner ear structures grossly normal. IMPRESSION: 1. Limited study due to patient's inability to tolerate the full length of the exam and motion artifact. 2. 5-6 mm focus of diffusion abnormality within the right periatrial white matter, suspicious for a small acute/early subacute small vessel ischemic infarct. 3. No other acute intracranial process identified. 4. Advanced cerebral atrophy with chronic microvascular ischemic disease and scattered remote ischemic infarcts as above. Electronically Signed   By: [name] Kitchen  Jeannine Boga M.D.   On: 01/01/2017 23:17        Scheduled Meds: . aspirin  300 mg Rectal Daily   Or  . aspirin  325 mg Oral Daily  . Chlorhexidine Gluconate Cloth  6 each Topical Q0600  . clopidogrel  75 mg Oral Daily  . doxycycline  100 mg Oral Q12H  . enoxaparin (LOVENOX) injection  40 mg Subcutaneous Q24H  . fluticasone  1 spray Each Nare Daily  . insulin aspart  0-9 Units Subcutaneous TID WC  . ketorolac  15 mg Intravenous Once  . mupirocin ointment  1 application Nasal BID  . pantoprazole  40 mg Oral Daily  . simvastatin  20 mg Oral QHS  . sodium chloride  500 mL Intravenous Once  . vitamin B-12  1,000 mcg Oral Daily   Continuous Infusions: . dextrose 5 % and 0.9% NaCl 75 mL/hr at 01/02/17 0252     LOS: 2 days    Time spent: 35 minutes.     Elmarie Shiley, MD [facility] Pager [phone]  If  7PM-7AM, please contact night-coverage [email or web address] Password [email or web address] 01/03/2017, 8:02 AM

## 2017-01-03 NOTE — Evaluation (Signed)
Occupational Therapy Evaluation Patient Details Name: Jared Hall MRN: [id number] DOB: [birth year] Today's Date: 01/03/2017    History of Present Illness 83 y.o. male with medical history significant of hypertension, hyperlipidemia, chronic kidney disease stage III, prior CVA, CHF with last EF of 55% April 2016, diabetes mellitus, hyperlipidemia who was recently hospitalized from 12/05/2016- 12/15/2016 for bilateral lower extremity cellulitis and edema, acute hypoxic respiratory failure, who presents to the ED from nursing facility at [facility] with a four-day history of slurred speech, progressive confusion. MRI was limited but suggestive of small acute/early subacute small vessel ischemic infarct in the right parietal white matter   Clinical Impression   Pt was participating in rehab at [facility] prior to admission. He typically lives alone. Pt is significantly HOH, difficult to accurately assess cognition, but demonstrating decreased awareness of safety and slow processing. Pt is generally weak, requiring min to mod assist for mobility and min to max assist for ADL. Recommending return to SNF for further rehab prior to return home. Will follow.    Follow Up Recommendations  SNF    Equipment Recommendations  3 in 1 bedside commode    Recommendations for Other Services       Precautions / Restrictions Precautions Precautions: Fall Precaution Comments: HOH; monitor vitals Restrictions Weight Bearing Restrictions: No      Mobility Bed Mobility Overal bed mobility: Needs Assistance Bed Mobility: Supine to Sit Rolling: Min assist         General bed mobility comments: assist to elevate trunk  Transfers Overall transfer level: Needs assistance Equipment used: Rolling walker (2 wheeled) Transfers: Sit to/from Stand Sit to Stand: Mod assist;+2 safety/equipment;Min assist Stand pivot transfers: Mod assist;+2 physical assistance       General transfer comment: mod  assist to rise and steady from bed, min assist from chair, pt sits prematurely    Balance Overall balance assessment: Needs assistance   Sitting balance-Leahy Scale: Fair       Standing balance-Leahy Scale: Poor                              ADL Overall ADL's : Needs assistance/impaired Eating/Feeding: Minimal assistance;Sitting   Grooming: Wash/dry hands;Wash/dry face;Minimal assistance;Sitting   Upper Body Bathing: Moderate assistance;Sitting   Lower Body Bathing: Maximal assistance;Sit to/from stand   Upper Body Dressing : Moderate assistance;Sitting   Lower Body Dressing: Total assistance;Sit to/from stand   Toilet Transfer: Moderate assistance;Stand-pivot;RW   Toileting- Clothing Manipulation and Hygiene: Total assistance;Bed level Toileting - Clothing Manipulation Details (indicate cue type and reason): pt with incontinence of bowel upon arrival       General ADL Comments: Pt with poor activity tolerance, VSS throughout.     Vision Additional Comments: appears intact   Perception     Praxis      Pertinent Vitals/Pain Pain Assessment: Faces Faces Pain Scale: Hurts a little bit Pain Location: L shoulder Pain Descriptors / Indicators: Sore Pain Intervention(s): Repositioned     Hand Dominance Right   Extremity/Trunk Assessment Upper Extremity Assessment Upper Extremity Assessment: Generalized weakness   Lower Extremity Assessment Lower Extremity Assessment: Defer to PT evaluation   Cervical / Trunk Assessment Cervical / Trunk Assessment: Normal   Communication Communication Communication: HOH   Cognition Arousal/Alertness: Awake/alert;Lethargic (closes eyes when not stimulated) Behavior During Therapy: Flat affect Overall Cognitive Status: Impaired/Different from baseline Area of Impairment: Problem solving;Safety/judgement;Orientation Orientation Level: Disoriented to;Situation;Time  Safety/Judgement: Decreased awareness of  safety   Problem Solving: Difficulty sequencing;Slow processing General Comments: some difficulty assessing due to pt being significantly [facility]   General Comments       Exercises       Shoulder Instructions      Home Living Family/patient expects to be discharged to:: Private residence Living Arrangements: Alone Available Help at Discharge: Family Type of Home: House Home Access: Ramped entrance     Home Layout: One level     Bathroom Shower/Tub: Occupational psychologist: Standard         Additional Comments: Pt admitted from [facility] where he was receiving rehab.      Prior Functioning/Environment Level of Independence: Independent with assistive device(s)        Comments: has a walker; daughter in law lives next door and does grocery shopping.  Pt was having difficulty getting sock on one foot        OT Problem List: Decreased strength;Decreased activity tolerance;Impaired balance (sitting and/or standing);Decreased cognition;Decreased safety awareness;Decreased knowledge of use of DME or AE;Obesity   OT Treatment/Interventions: Self-care/ADL training;DME and/or AE instruction;Balance training;Patient/family education;Therapeutic activities;Cognitive remediation/compensation    OT Goals(Current goals can be found in the care plan section) Acute Rehab OT Goals Patient Stated Goal: to find out whats wrong OT Goal Formulation: With patient Time For Goal Achievement: 12/27/16 Potential to Achieve Goals: Good ADL Goals Pt Will Perform Grooming: with min guard assist;standing (one activity) Pt Will Perform Upper Body Dressing: sitting;with supervision Pt Will Perform Lower Body Dressing: with min assist;with adaptive equipment;sit to/from stand Pt Will Transfer to Toilet: with min guard assist;ambulating;bedside commode (over toilet) Pt Will Perform Toileting - Clothing Manipulation and hygiene: with min assist;sit to/from stand Additional ADL Goal #1:  Pt will be oriented using environmental cues.  OT Frequency: Min 2X/week   Barriers to D/C:            Co-evaluation PT/OT/SLP Co-Evaluation/Treatment: Yes Reason for Co-Treatment: For patient/therapist safety PT goals addressed during session: Mobility/safety with mobility OT goals addressed during session: ADL's and self-care      End of Session Equipment Utilized During Treatment: Rolling walker;Oxygen Nurse Communication: Mobility status (incontinence)  Activity Tolerance: Patient limited by fatigue Patient left: in chair;with call bell/phone within reach;with chair alarm set   Time: 1343-1418 OT Time Calculation (min): 35 min Charges:  OT General Charges $OT Visit: 1 Procedure OT Evaluation $OT Eval Moderate Complexity: 1 Procedure G-Codes:    Malka So 01/03/2017, 2:45 PM  [phone]

## 2017-01-03 NOTE — Progress Notes (Signed)
RT attempted x 3 to obtain ABG without success. RN notified.

## 2017-01-03 NOTE — Progress Notes (Signed)
Speech Language Pathology Treatment: Dysphagia  Patient Details Name: Jared Hall MRN: [id number] DOB: 01-23-1931 Today's Date: 01/03/2017 Time: [id number] SLP Time Calculation (min) (ACUTE ONLY): 14 min  Assessment / Plan / Recommendation Clinical Impression  Pt demonstrates improved arousal since prior assessment. Despite this, swallow response subjectively appears delayed and each sip of thin liquids is followed by a single delayed cough. Trials of nectar thick liquids did not result in any coughing and purees and solids also tolerated well despite missing dentition. Pt unwilling to try many bites or sips, was very hard of hearing and wanted to go back to sleep. Could not attempt assessment of speech, language or cognition, though given history, do not feel that acute assessment in this area will be beneficial. Will defer to next level of care, but will continue to follow for tolerance of Dys 3 (mech soft) diet and nectar thick liquids and possible advancement if pt becomes more cooperative.    HPI HPI: 83 y.o.malewith medical history significant of hypertension, hyperlipidemia, chronic kidney disease stage III, prior CVA, CHF with last EF of 55% April 2016, diabetes mellitus, hyperlipidemia who was recently hospitalized from 12/05/2016- 12/15/2016 for bilateral lower extremity cellulitis and edema, acute hypoxic respiratory failure, who presents to the ED from nursing facility at [facility] with a four-day history of slurred speech, progressive confusion. MRI was limited but suggestive of small acute/early subacute small vessel ischemic infarct in the right parietal white matter. Previous MBS 03/16/15 recommended Dys 2 diet and nectar thick liquids due to silent aspiration of thin liquids. Pt admitted wtih Bell's Palsy at that time with incidental finding of left frontal CVA.      SLP Plan  Continue with current plan of care     Recommendations  Diet recommendations: Dysphagia 3 (mechanical  soft);Nectar-thick liquid Liquids provided via: Cup;Straw Medication Administration: Whole meds with puree Supervision: Full supervision/cueing for compensatory strategies;Staff to assist with self feeding Compensations: Slow rate;Small sips/bites Postural Changes and/or Swallow Maneuvers: Seated upright 90 degrees                Plan: Continue with current plan of care       GO                Shaquita Fort, Katherene Ponto 01/03/2017, 9:51 AM

## 2017-01-03 NOTE — Care Management Note (Addendum)
Case Management Note  Patient Details  Name: Jared Hall MRN: [id number] Date of Birth: June 08, 1931  Subjective/Objective:  Patient presents from [facility] SNF,with ischemic stroke with 4 day hx of slurred speech, and progressive confusion.MRI shows acute to subacute stroke, also with hx of ckd, htn, hld, chf, prior cva , per pt/ot eval rec snf, CSW aware.    2/16 [place] ,BSN - spoke with MD ,if hematuria is clear tomorrow, patient can be ready to go to snf and if snf can take patient over the weekend. Informed CSW of this message also.               Action/Plan:   Expected Discharge Date:                  Expected Discharge Plan:  Skilled Nursing Facility  In-House Referral:  Clinical Social Work  Discharge planning Services  CM Consult  Post Acute Care Choice:    Choice offered to:     DME Arranged:    DME Agency:     HH Arranged:    [place] Agency:     Status of Service:  Completed, signed off  If discussed at [employer], dates discussed:    Additional Comments:  Zenon Mayo, RN 01/03/2017, 3:37 PM

## 2017-01-03 NOTE — Progress Notes (Signed)
Inpatient Diabetes Program Recommendations  AACE/ADA: New Consensus Statement on Inpatient Glycemic Control (2015)  Target Ranges:  Prepandial:   less than 140 mg/dL      Peak postprandial:   less than 180 mg/dL (1-2 hours)      Critically ill patients:  140 - 180 mg/dL   Lab Results  Component Value Date   GLUCAP 191 (H) 01/03/2017   HGBA1C 8.6 (H) 01/02/2017    Review of Glycemic Control:  Results for DELANEY, PARTSCH (MRN [id number]) as of 01/03/2017 14:39  Ref. Range 01/02/2017 16:14 01/02/2017 19:53 01/03/2017 00:19 01/03/2017 07:52 01/03/2017 11:49  Glucose-Capillary Latest Ref Range: 65 - 99 mg/dL 104 (H) 101 (H) 137 (H) 178 (H) 191 (H)   Diabetes history:  Outpatient Diabetes medications: Lantus 40 units bid Current orders for Inpatient glycemic control:  Novolog sensitive tid with meals Inpatient Diabetes Program Recommendations:   Please consider adding Lantus 20 units daily.  Thanks, Adah Perl, RN, BC-ADM Inpatient Diabetes Coordinator Pager [phone] (8a-5p)

## 2017-01-03 NOTE — Progress Notes (Addendum)
Pharmacy Antibiotic Note  Jared Hall is a 83 y.o. male admitted on 01/01/2017 from SNF with AMS and multiple falls.  Pt noted to have bilateral LE cellulitis and started on PO Doxycycline.  Pharmacy has been consulted for Cefepime dosing as well since last wound cx (Jan 2018) grew pseudomonas.  Plan: Cefepime 2gm IV q12h   Pt also on Lovenox for VTE prophylaxis.  Will increase dose to 60mg  SQ q24h based on obesity dosing.   Height: 5\' 11"  (180.3 cm) Weight: 261 lb 4.8 oz (118.5 kg) IBW/kg (Calculated) : 75.3  Temp (24hrs), Avg:98.3 F (36.8 C), Min:97.7 F (36.5 C), Max:98.7 F (37.1 C)   Recent Labs Lab 01/01/17 1507 01/01/17 1650 01/02/17 0237 01/03/17 0157  WBC 9.5  --  8.3 10.3  CREATININE 1.77*  --  1.45* 1.08  LATICACIDVEN  --  0.88  --   --     Estimated Creatinine Clearance: 67.9 mL/min (by C-G formula based on SCr of 1.08 mg/dL).    Allergies  Allergen Reactions  . Ativan [Lorazepam] Other (See Comments)    Flailing of extremities noted immediately s/p IV administration.  [name] Kitchen Flomax [Tamsulosin Hcl] Other (See Comments)    Excessive tearing  . Glucophage [Metformin Hcl] Diarrhea    Antimicrobials this admission: Moxi outpt completed 7d course 2/11 Doxy PO Cefepime 2/14 >>  Dose adjustments this admission: none  Microbiology results: 2/13 MRSA PCR positive 2/12 urine negative  Thank you for allowing pharmacy to be a part of this patient's care.  [employer], Pharm.D., BCPS Clinical Pharmacist Pager [phone] 01/03/2017 9:09 AM

## 2017-01-03 NOTE — Evaluation (Signed)
Physical Therapy Evaluation Patient Details Name: Jared Hall MRN: [id number] DOB: Mar 16, 1931 Today's Date: 01/03/2017   History of Present Illness  83 y.o. male with medical history significant of hypertension, hyperlipidemia, chronic kidney disease stage III, prior CVA, CHF with last EF of 55% April 2016, diabetes mellitus, hyperlipidemia who was recently hospitalized from 12/05/2016- 12/15/2016 for bilateral lower extremity cellulitis and edema, acute hypoxic respiratory failure, who presents to the ED from nursing facility at [name] with a four-day history of slurred speech, progressive confusion. MRI was limited but suggestive of small acute/early subacute small vessel ischemic infarct in the right parietal white matter  Clinical Impression  Patient demonstrates deficits in functional mobility as indicated below. Will need continued skilled PT to address deficits and maximize function. Will see as indicated and progress as tolerated. Recommend return to SNF upon acute discharge for further rehabilitation.    Follow Up Recommendations SNF;Supervision/Assistance - 24 hour    Equipment Recommendations  None recommended by PT    Recommendations for Other Services       Precautions / Restrictions Precautions Precautions: Fall Precaution Comments: HOH; monitor vitals Restrictions Weight Bearing Restrictions: No      Mobility  Bed Mobility Overal bed mobility: Needs Assistance Bed Mobility: Supine to Sit Rolling: Min assist         General bed mobility comments: assist to elevate trunk to upright  Transfers Overall transfer level: Needs assistance Equipment used: Rolling walker (2 wheeled) Transfers: Sit to/from Stand Sit to Stand: Mod assist;+2 safety/equipment;Min assist Stand pivot transfers: Mod assist;+2 physical assistance       General transfer comment: mod assist to rise and steady from bed, min assist from chair, pt sits prematurely  Ambulation/Gait              General Gait Details: deferred due to patient fatigue  Stairs            Wheelchair Mobility    Modified Rankin (Stroke Patients Only)       Balance Overall balance assessment: Needs assistance   Sitting balance-Leahy Scale: Fair       Standing balance-Leahy Scale: Poor                               Pertinent Vitals/Pain Pain Assessment: Faces Faces Pain Scale: Hurts a little bit Pain Location: L shoulder Pain Descriptors / Indicators: Sore Pain Intervention(s): Repositioned    Home Living Family/patient expects to be discharged to:: Private residence Living Arrangements: Alone Available Help at Discharge: Family Type of Home: House Home Access: Ramped entrance     Home Layout: One level   Additional Comments: Pt admitted from [facility] where he was receiving rehab.    Prior Function Level of Independence: Independent with assistive device(s)         Comments: has a walker; daughter in law lives next door and does grocery shopping.  Pt was having difficulty getting sock on one foot     Hand Dominance   Dominant Hand: Right    Extremity/Trunk Assessment   Upper Extremity Assessment Upper Extremity Assessment: Generalized weakness    Lower Extremity Assessment Lower Extremity Assessment: Generalized weakness RLE Deficits / Details: wounds noted BLEs    Cervical / Trunk Assessment Cervical / Trunk Assessment: Normal  Communication   Communication: HOH  Cognition Arousal/Alertness: Awake/alert;Lethargic (closes eyes when not stimulated) Behavior During Therapy: Flat affect Overall Cognitive Status: Impaired/Different from baseline Area  of Impairment: Problem solving;Safety/judgement;Orientation Orientation Level: Disoriented to;Situation;Time       Safety/Judgement: Decreased awareness of safety   Problem Solving: Difficulty sequencing;Slow processing General Comments: some difficulty assessing due to pt  being significantly HOH    General Comments General comments (skin integrity, edema, etc.): hygiene and pericare performed due to incontinence of bowels    Exercises     Assessment/Plan    PT Assessment Patient needs continued PT services  PT Problem List Decreased strength;Decreased range of motion;Decreased mobility;Decreased knowledge of precautions;Decreased safety awareness;Decreased knowledge of use of DME;Decreased activity tolerance;Decreased skin integrity          PT Treatment Interventions DME instruction;Gait training;Functional mobility training;Therapeutic activities;Patient/family education    PT Goals (Current goals can be found in the Care Plan section)  Acute Rehab PT Goals Patient Stated Goal: to find out whats wrong PT Goal Formulation: With patient Time For Goal Achievement: 01/17/17 Potential to Achieve Goals: Fair    Frequency Min 2X/week   Barriers to discharge Decreased caregiver support      Co-evaluation PT/OT/SLP Co-Evaluation/Treatment: Yes Reason for Co-Treatment: For patient/therapist safety PT goals addressed during session: Mobility/safety with mobility OT goals addressed during session: ADL's and self-care       End of Session Equipment Utilized During Treatment: Gait belt Activity Tolerance: Patient tolerated treatment well;Patient limited by fatigue Patient left: in chair;with call bell/phone within reach;with chair alarm set Nurse Communication: Mobility status         Time: [id number] PT Time Calculation (min) (ACUTE ONLY): 30 min   Charges:   PT Evaluation $PT Eval Moderate Complexity: 1 Procedure     PT G Codes:        Duncan Dull 01-09-2017, 2:46 PM Alben Deeds, [place] DPT  [phone]

## 2017-01-03 NOTE — Progress Notes (Signed)
Pt placed on Cpap for the night at 11cmh2o and 2lt [place]. This rt went out of room and pt instantly took it off. RN notified

## 2017-01-03 NOTE — Progress Notes (Signed)
STROKE TEAM PROGRESS NOTE   HISTORY OF PRESENT ILLNESS (per record) Jared Hall is an 83 y.o. male with medical history significant of hypertension, hyperlipidemia, chronic kidney disease stage III, prior CVA, CHF with last EF of 55% April 2016, diabetes mellitus, hyperlipidemia who was recently hospitalized from 12/05/2016- 12/15/2016 for bilateral lower extremity cellulitis and edema, acute hypoxic respiratory failure, who presents to the ED from nursing facility at [facility] with a four-day history of slurred speech, progressive confusion.  MRI obtained in ED showing acute to subacute stroke and neurology consulted. patient is on ASA and Plavix. Patient is very lethargic, show significant dysarthria, is able to follow simple commands but not complex commands. He appears to be very encephalopathic. He lives [place] 12/30/2016, time, unable to be determined. Patient was not administered IV t-PA secondary to being outside the window.   SUBJECTIVE (INTERVAL HISTORY) RN is at bedside, no family at bedside. Pt awake alert and mental status much improved. Yesterday ABG showed high PCO2 level, tried to repeat today however, unsuccessful.   OBJECTIVE Temp:  [97.9 F (36.6 C)-98.7 F (37.1 C)] 98.2 F (36.8 C) (02/14 0700) Pulse Rate:  [81-102] 102 (02/14 0700) Cardiac Rhythm: Normal sinus rhythm (02/14 0900) Resp:  [22-27] 23 (02/14 0700) BP: (126-157)/(64-135) 149/88 (02/14 0700) SpO2:  [93 %-100 %] 98 % (02/14 0700)  CBC:  Recent Labs Lab 01/01/17 1507 01/02/17 0237 01/03/17 0157  WBC 9.5 8.3 10.3  NEUTROABS 6.7  --   --   HGB 10.6* 10.3* 11.1*  HCT 35.6* 34.9* 37.3*  MCV 69.4* 70.2* 70.2*  PLT 164 151 136*    Basic Metabolic Panel:  Recent Labs Lab 01/02/17 0237 01/03/17 0157  NA 143 141  K 3.5 3.6  CL 98* 98*  CO2 35* 31  GLUCOSE 84 144*  BUN 15 10  CREATININE 1.45* 1.08  CALCIUM 8.8* 8.7*    Lipid Panel:    Component Value Date/Time   CHOL 130 01/02/2017 0237    TRIG 130 01/02/2017 0237   HDL 28 (L) 01/02/2017 0237   CHOLHDL 4.6 01/02/2017 0237   VLDL 26 01/02/2017 0237   LDLCALC 76 01/02/2017 0237   HgbA1c:  Lab Results  Component Value Date   HGBA1C 8.6 (H) 01/02/2017   Urine Drug Screen:    Component Value Date/Time   LABOPIA POSITIVE (A) 12/07/2010 2358   COCAINSCRNUR NONE DETECTED 12/07/2010 2358   LABBENZ NONE DETECTED 12/07/2010 2358   AMPHETMU NONE DETECTED 12/07/2010 2358   THCU NONE DETECTED 12/07/2010 2358   LABBARB  12/07/2010 2358    NONE DETECTED        DRUG SCREEN FOR MEDICAL PURPOSES ONLY.  IF CONFIRMATION IS NEEDED FOR ANY PURPOSE, NOTIFY LAB WITHIN 5 DAYS.        LOWEST DETECTABLE LIMITS FOR URINE DRUG SCREEN Drug Class       Cutoff (ng/mL) Amphetamine      1000 Barbiturate      200 Benzodiazepine   [id number] Tricyclics       [id number] Opiates          300 Cocaine          300 THC              50      IMAGING I have personally reviewed the radiological images below and agree with the radiology interpretations.  Dg Chest 2 View 01/01/2017 Stable chronic elevation of left hemidiaphragm and prominent fat pad in left cardiophrenic angle. There is  linear atelectasis or infiltrate in lingula. No pulmonary edema. Right lung is clear   Ct Head Wo Contrast 01/01/2017 1. Atrophy and small vessel disease. 2. 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 3. Remote lacunar infarct of the right cerebellar hemisphere. 4.  No evidence for acute CT abnormality.   Ct Cervical Spine Wo Contrast 01/01/2017 Significant cervical spine degenerative changes without evidence for acute abnormality.   Mr Brain Wo Contrast 01/01/2017 1. Limited study due to patient's inability to tolerate the full length of the exam and motion artifact. 2. 5-6 mm focus of diffusion abnormality within the right periatrial white matter, suspicious for a small acute/early subacute small vessel ischemic infarct. 3. No other acute intracranial process identified.  4. Advanced cerebral atrophy with chronic microvascular ischemic disease and scattered remote ischemic infarcts as above.   Carotid Doppler Unable to visualize right vertebral artery due to patient positioning. Unable to interrogate left carotid system due to patient becoming combative. Findings are consistent with a 1-39 percent stenosis involving the right internal carotid artery.   Ct Angio Head and neck W Or Wo Contrast 01/03/2017 IMPRESSION: 1. No CT evidence of acute intracranial abnormality. The small infarct seen on the prior MRI is not visible on this study. 2. No occlusion or hemodynamically significant stenosis of the intracranial or cervical arteries. 3. Aortic atherosclerosis.   PHYSICAL EXAM  Temp:  [97.9 F (36.6 C)-99 F (37.2 C)] 98.1 F (36.7 C) (02/14 1500) Pulse Rate:  [93-102] 95 (02/14 1500) Resp:  [20-28] 28 (02/14 1500) BP: (134-163)/(69-135) 163/80 (02/14 1500) SpO2:  [93 %-100 %] 99 % (02/14 1500)  General - Well nourished, well developed, in no apparent distress.  Ophthalmologic - Fundi not visualized due to noncooperation.  Cardiovascular - Regular rate and rhythm.  Extremities - bilateral foreleg cellulitis  Mental Status -  Level of arousal and orientation to time, place, and person were intact. Language including expression, naming, repetition, comprehension was assessed and found intact. Fund of Knowledge was assessed and was impaired  Cranial Nerves II - XII - II - Visual field intact OU. III, IV, VI - Extraocular movements intact. V - Facial sensation intact bilaterally. VII - Facial movement intact bilaterally. VIII - Hearing & vestibular intact bilaterally. X - Palate elevates symmetrically. XI - Chin turning & shoulder shrug intact bilaterally. XII - Tongue protrusion intact.  Motor Strength - The patient's strength was normal in all extremities and pronator drift was absent.  Bulk was normal and fasciculations were absent.   Motor  Tone - Muscle tone was assessed at the neck and appendages and was normal.  Reflexes - The patient's reflexes were 1+ in all extremities and he had no pathological reflexes.  Sensory - Light touch, temperature/pinprick were assessed and were symmetrical.    Coordination - The patient had normal movements in the hands and feet with no ataxia or dysmetria.  Tremor was absent.  Gait and Station - not tested due to safety concerns   ASSESSMENT/PLAN Mr. Jared Hall is a 83 y.o. male with history of hypertension, hyperlipidemia, chronic kidney disease stage III, previous stroke, the HF, diabetes mellitus, hyperlipidemia, with recent hospitalization in January for bilateral lower extremities cellulitis and edema, hypoxic respiratory failure presenting from his SNF with a four-day history of slurred speech and progressive confusion. He did not receive IV t-PA due to delay in arrival.   AMS - likely due to hypercapnia  ABG yesterday showed hypercapnia  Tried to repeat ABG today but  unsuccessful  Afebrile, no leukocytosis, UA negative, CXR unremarkable  Stroke, incidental finding - right parietal periventricular white matter infarct secondary to small vessel disease vs. PAF not on anticoagulation - incidental finding, cannot explain patient mental status change   CT head old right cerebellar lacune. No acute abnormality  MRI  R periventricular white matter infarct. Advanced atrophy and small vessel disease. Remote scattered infarct.  CTA head and neck unremarkable  2D Echo  12/06/16 EF 55-60%  EEG ATTEMPTED. PATIENT REFUSED  LDL 76  HgbA1c 8.6  Lovenox 60 mg sq daily for VTE prophylaxis (obesity dosing)  DIET DYS 3 Room service appropriate? Yes; Fluid consistency: Nectar Thick  aspirin 81 mg daily and clopidogrel 75 mg daily prior to admission, now on aspirin 325 mg daily and clopidogrel 75 mg daily. Continue DAPT on discharge  Patient counseled to be compliant with his  antithrombotic medications  Ongoing aggressive stroke risk factor management  Therapy recommendations:  pending   Disposition:  pending   Paroxysmal Atrial Fibrillation  Home anticoagulation:  none   anticoagulation recommended during stroke admission in 2016   Had been on eliquis per admission note, but had been discontinued previously due to risk of falls per [place] note  Continue DAPT on discharge  Hypertension  Stable  Permissive hypertension (OK if <220/120) for 24-48 hours post stroke and then gradually normalized within 5-7 days.  Long-term BP goal normotensive  Hyperlipidemia  Home meds:  Zocor 20 mg daily, resumed in hospital  LDL 76, goal < 70  Continue statin at discharge  Diabetes  HgbA1c 8.6, goal < 7.0  Mild hyperglycemia  SSI  CBG monitoring  Other Stroke Risk Factors  Advanced age  Obesity, Body mass index is 36.44 kg/m., recommend weight loss, diet and exercise as appropriate   Hx stroke/TIA  02/2015 Incidental punctate L frontal infarct, likely embolic secondary to known atrial fibrillation - recommended anticoagulation on discharge  02/2015 L Bell's Palsy  Obstructive sleep apnea  Hx CHF    Other Active Problems  acute on chronic hyper Respiratory failure   lower extremities cellulitis   acute kidney injury on chronic kidney disease stage III   history of PSVT  Hospital day # 2  Neurology will sign off. Please call with questions. Pt will follow up with [name] at [facility] in about 6 weeks. Thanks for the consult.  Rosalin Hawking, MD PhD Stroke Neurology 01/03/2017 4:08 PM   To contact Stroke Continuity provider, please refer to [email or web address] After hours, contact General Neurology

## 2017-01-04 LAB — GLUCOSE, CAPILLARY
GLUCOSE-CAPILLARY: 144 mg/dL — AB (ref 65–99)
GLUCOSE-CAPILLARY: 163 mg/dL — AB (ref 65–99)
GLUCOSE-CAPILLARY: 164 mg/dL — AB (ref 65–99)
GLUCOSE-CAPILLARY: 169 mg/dL — AB (ref 65–99)
GLUCOSE-CAPILLARY: 172 mg/dL — AB (ref 65–99)
GLUCOSE-CAPILLARY: 191 mg/dL — AB (ref 65–99)
Glucose-Capillary: 141 mg/dL — ABNORMAL HIGH (ref 65–99)

## 2017-01-04 LAB — BASIC METABOLIC PANEL
Anion gap: 7 (ref 5–15)
BUN: 9 mg/dL (ref 6–20)
CHLORIDE: 101 mmol/L (ref 101–111)
CO2: 31 mmol/L (ref 22–32)
Calcium: 8.5 mg/dL — ABNORMAL LOW (ref 8.9–10.3)
Creatinine, Ser: 1.03 mg/dL (ref 0.61–1.24)
GFR calc non Af Amer: >60 mL/min (ref >60)
Glucose, Bld: 166 mg/dL — ABNORMAL HIGH (ref 65–99)
POTASSIUM: 4.6 mmol/L (ref 3.5–5.1)
SODIUM: 139 mmol/L (ref 135–145)

## 2017-01-04 LAB — CBC
HEMATOCRIT: 37 % — AB (ref 39.0–52.0)
HEMOGLOBIN: 11.3 g/dL — AB (ref 13.0–17.0)
MCH: 21.2 pg — ABNORMAL LOW (ref 26.0–34.0)
MCHC: 30.5 g/dL (ref 30.0–36.0)
MCV: 69.4 fL — ABNORMAL LOW (ref 78.0–100.0)
Platelets: 135 10*3/uL — ABNORMAL LOW (ref 150–400)
RBC: 5.33 MIL/uL (ref 4.22–5.81)
RDW: 15.6 % — ABNORMAL HIGH (ref 11.5–15.5)
WBC: 12.5 10*3/uL — AB (ref 4.0–10.5)

## 2017-01-04 LAB — C DIFFICILE QUICK SCREEN W PCR REFLEX
C DIFFICILE (CDIFF) TOXIN: POSITIVE — AB
C Diff antigen: POSITIVE — AB
C Diff interpretation: DETECTED

## 2017-01-04 LAB — OCCULT BLOOD X 1 CARD TO LAB, STOOL: FECAL OCCULT BLD: POSITIVE — AB

## 2017-01-04 MED ORDER — TRAMADOL HCL 50 MG PO TABS
50.0000 mg | ORAL_TABLET | Freq: Four times a day (QID) | ORAL | Status: AC | PRN
  Administered 2017-01-04 (×2): 50 mg via ORAL
  Filled 2017-01-04 (×2): qty 1

## 2017-01-04 MED ORDER — VANCOMYCIN 50 MG/ML ORAL SOLUTION
125.0000 mg | Freq: Four times a day (QID) | ORAL | Status: DC
  Administered 2017-01-04 – 2017-01-09 (×22): 125 mg via ORAL
  Filled 2017-01-04 (×24): qty 2.5

## 2017-01-04 NOTE — [place] FL2 (Signed)
[place] MEDICAID FL2 LEVEL OF CARE SCREENING TOOL     IDENTIFICATION  Patient Name: Jared Hall Birthdate: Oct 23, 1931 Sex: male Admission Date (Current Location): 01/01/2017  [facility] and [place] Number:  Herbalist and Address:  [facility], [place] [address] [place], [place] [address]      Provider Number: [id number]  Attending Physician Name and Address:  Elmarie Shiley, MD  Relative Name and Phone Number:       Current Level of Care: Hospital Recommended Level of Care: [place] Prior Approval Number:    Date Approved/Denied:   PASRR Number: [id number]  Discharge Plan: SNF    Current Diagnoses: Patient Active Problem List   Diagnosis Date Noted  . Altered mental status   . Acute respiratory failure with hypercapnia ([place])   . CVA (cerebral vascular accident) ([place]) 01/01/2017  . Dehydration 01/01/2017  . Anemia 01/01/2017  . Acute respiratory failure ([place]) 12/15/2016  . Morbid obesity ([place]) 12/15/2016  . Hard of hearing 12/15/2016  . Chronic kidney disease, stage III (moderate) 12/06/2016  . Bell's palsy   . Facial droop   . Paroxysmal atrial fibrillation (HCC)   . Embolic stroke ([place])   . Dysphagia 03/14/2015  . Dysarthria 03/14/2015  . Stroke ([place]) 03/14/2015  . Cellulitis 01/11/2014  . Bilateral lower extremity edema 01/11/2014  . Venous stasis dermatitis 01/11/2014  . DM type 2 with diabetic peripheral neuropathy ([place]) 01/11/2014  . Cellulitis and abscess of leg 01/11/2014  . Cellulitis and abscess 01/11/2014  . Depression 06/15/2013  . GERD (gastroesophageal reflux disease) 06/15/2013  . Constipation 06/15/2013  . Diabetic neuropathy ([place]) 06/15/2013  . Type 1 diabetes mellitus with peripheral circulatory complications ([place]) 123456  . Essential hypertension, benign 04/07/2013  . AKI (acute kidney injury) ([place]) 04/04/2013  . Cellulitis of lower extremity 04/04/2013  . Right hip pain 04/04/2013   . Renal insufficiency 12/16/2011  . History of CVA (cerebrovascular accident) 12/16/2011  . Dyslipidemia 12/16/2011  . Obesity (BMI 30-39.9) 12/16/2011  . Umbilical hernia with obstruction-partial on CT scan; easily reducible 12/14/2011  . History of PSVT (paroxysmal supraventricular tachycardia) 12/14/2011  . DM (diabetes mellitus) ([place]) 12/14/2011    Orientation RESPIRATION BLADDER Height & Weight     Self, Time, Place  Normal Incontinent, External catheter Weight: 261 lb 4.8 oz (118.5 kg) Height:  5\' 11"  (180.3 cm)  BEHAVIORAL SYMPTOMS/MOOD NEUROLOGICAL BOWEL NUTRITION STATUS   (None)  (CVA) Incontinent Diet (DYS 3, Fluid Nectar thick)  AMBULATORY STATUS COMMUNICATION OF NEEDS Skin   Extensive Assist Verbally Bruising, Other (Comment), PU Stage and Appropriate Care (Cellulitis, MASD, Excoriated, Weeping. Open/dehisced wound/incision both legs.)   PU Stage 2 Dressing:  (Right buttocks: Foam prn)                   Personal Care Assistance Level of Assistance  Bathing, Feeding, Dressing Bathing Assistance: Maximum assistance Feeding assistance: Limited assistance Dressing Assistance: Maximum assistance     Functional Limitations Info  Sight, Hearing, Speech Sight Info: Adequate Hearing Info: Adequate Speech Info: Impaired    SPECIAL CARE FACTORS FREQUENCY  Blood pressure, PT (By licensed PT), OT (By licensed OT), Speech therapy     PT Frequency: 5 x week OT Frequency: 5 x week     Speech Therapy Frequency: 5 x week      Contractures Contractures Info: Not present    Additional Factors Info  Psychotropic, Code Status, Allergies, Isolation Precautions Code Status Info:  DNR Allergies Info: Ativan (Lorazepam), Flomax (Tamsulosin Hcl), Glucophage (Metformin Hcl) Psychotropic Info: Depression: No psychotropic meds   Isolation Precautions Info: Enteric: MRSA     Current Medications (01/04/2017):  This is the current hospital active medication list Current  Facility-Administered Medications  Medication Dose Route Frequency Provider Last Rate Last Dose  . acetaminophen (TYLENOL) tablet 650 mg  650 mg Oral Q4H PRN Eugenie Filler, MD       Or  . acetaminophen (TYLENOL) solution 650 mg  650 mg Per Tube Q4H PRN Eugenie Filler, MD       Or  . acetaminophen (TYLENOL) suppository 650 mg  650 mg Rectal Q4H PRN Eugenie Filler, MD      . aspirin suppository 300 mg  300 mg Rectal Daily Irine Seal V, MD   300 mg at 01/02/17 1000   Or  . aspirin tablet 325 mg  325 mg Oral Daily Eugenie Filler, MD   325 mg at 01/04/17 0957  . ceFEPIme (MAXIPIME) 2 g in dextrose 5 % 50 mL IVPB  2 g Intravenous Q12H Kimberly B Hammons, RPH   2 g at 01/04/17 0957  . Chlorhexidine Gluconate Cloth 2 % PADS 6 each  6 each Topical Q0600 Belkys A Regalado, MD   6 each at 01/04/17 0600  . clopidogrel (PLAVIX) tablet 75 mg  75 mg Oral Daily Eugenie Filler, MD   75 mg at 01/04/17 0957  . doxycycline (VIBRA-TABS) tablet 100 mg  100 mg Oral Q12H Belkys A Regalado, MD   100 mg at 01/04/17 0957  . enoxaparin (LOVENOX) injection 60 mg  60 mg Subcutaneous Q24H Theone Murdoch Hammons, RPH   60 mg at 01/03/17 2146  . fluticasone (FLONASE) 50 MCG/ACT nasal spray 1 spray  1 spray Each Nare Daily Eugenie Filler, MD   1 spray at 01/03/17 1000  . insulin aspart (novoLOG) injection 0-9 Units  0-9 Units Subcutaneous TID WC Eugenie Filler, MD   2 Units at 01/04/17 1211  . ketorolac (TORADOL) 15 MG/ML injection 15 mg  15 mg Intravenous Once Belkys A Regalado, MD      . mupirocin ointment (BACTROBAN) 2 % 1 application  1 application Nasal BID Elmarie Shiley, MD   1 application at [id number]  . ondansetron (ZOFRAN) injection 4 mg  4 mg Intravenous Q6H PRN Eugenie Filler, MD      . pantoprazole (PROTONIX) EC tablet 40 mg  40 mg Oral Daily Eugenie Filler, MD   40 mg at 01/04/17 0958  . RESOURCE THICKENUP CLEAR   Oral PRN Belkys A Regalado, MD      . simvastatin (ZOCOR) tablet  20 mg  20 mg Oral QHS Eugenie Filler, MD   20 mg at 01/03/17 2147  . sodium chloride 0.9 % bolus 500 mL  500 mL Intravenous Once Shawn C Joy, PA-C      . vitamin B-12 (CYANOCOBALAMIN) tablet 1,000 mcg  1,000 mcg Oral Daily Eugenie Filler, MD   1,000 mcg at 01/04/17 [id number]     Discharge Medications: Please see discharge summary for a list of discharge medications.  Relevant Imaging Results:  Relevant Lab Results:   Additional Information SS#: [id number]  Candie Chroman, LCSW

## 2017-01-04 NOTE — Care Management Important Message (Signed)
Important Message  Patient Details  Name: Jared Hall MRN: [id number] Date of Birth: 01-Apr-1931   Medicare Important Message Given:  Yes    Zenon Mayo, RN 01/04/2017, 2:10 [place] Message  Patient Details  Name: Jared Hall MRN: [id number] Date of Birth: 10-Oct-1931   Medicare Important Message Given:  Yes    Zenon Mayo, RN 01/04/2017, 2:10 PM

## 2017-01-04 NOTE — Consult Note (Signed)
   [facility] CM Inpatient Consult   01/04/2017  ARMAAN RICHEL 03-01-1931 [id number]   Follow up:  Spoke with inpatient [facility] and inpatient social worker and the patient's disposition is to return to [facility] skilled facility at discharge.  For questions or referrals please contact:  Natividad Brood, RN BSN [facility] Liaison  [phone] business mobile phone Toll free office [phone]

## 2017-01-04 NOTE — Progress Notes (Signed)
CRITICAL VALUE ALERT  Critical value received:  Positive C.Diff  Date of notification:  01/04/17  Time of notification:  1250  Critical value read back:Yes.    Nurse who received alert:  Stann Ore   MD notified (1st page):  Dr. Tyrell Antonio  Time of first page:  1255  MD notified (2nd page):  Time of second page:  Responding MD:   Time MD responded:

## 2017-01-04 NOTE — Progress Notes (Signed)
PROGRESS NOTE    PHALEN BONNAR  [id number] DOB: 1931-06-28 DOA: 01/01/2017 PCP: Henrine Screws, MD   Brief Narrative: Jared Hall is a 83 y.o. male with medical history significant of hypertension, hyperlipidemia, chronic kidney disease stage III, prior CVA, CHF with last EF of 55% April 2016, diabetes mellitus, hyperlipidemia who was recently hospitalized from 12/05/2016- 12/15/2016 for bilateral lower extremity cellulitis and edema, acute hypoxic respiratory failure, who presents to the ED from nursing facility at [facility] with a four-day history of slurred speech, progressive confusion. Patient with slurred dysarthric speech with an expressive aphasia and a such history obtained from ED PA and from the chart. Per chart in ED PA it was noted that patient had had a four-day history of altered mental status with slurred speech and also noted to have a left facial droop. ED RN spoke with staff at the nursing home it was noticed that patient began to be more confused on 12/29/2016. The morning of admission patient was noted to be more confused and had multiple falls and only oriented to self. Patient subsequently sent to the ED.   Assessment & Plan:   Principal Problem:   CVA (cerebral vascular accident) ([place]) Active Problems:   History of CVA (cerebrovascular accident)   Dyslipidemia   AKI (acute kidney injury) ([place])   Essential hypertension, benign   Type 1 diabetes mellitus with peripheral circulatory complications (HCC)   Depression   GERD (gastroesophageal reflux disease)   Diabetic neuropathy (HCC)   Venous stasis dermatitis   DM type 2 with diabetic peripheral neuropathy (HCC)   Paroxysmal atrial fibrillation (HCC)   Chronic kidney disease, stage III (moderate)   Morbid obesity (HCC)   Dehydration   Anemia   Altered mental status   Acute respiratory failure with hypercapnia ([place])    1-Acute encephalopathy;  This could be multifactorial, secondary to medications, Vs  stroke vs hypercapnia.  MRI showed stroke. Neurology consulted. Neurology doesn't think that MRI finding explain patient mental status.  Holding sedative; gabapentin, amitryptiline, bupropion.  EEG ordered. Unable to be perform, patient not cooperative with test  Ammonia normal at 19.  UA negative, Chest x ray ; atelectasis vs infiltrates lingula.  Check blood culture.    2-Acute on chronic hypercapnic respiratory failure.  Transfer t step down unit.  CCM consulted. BIPAP ordered. Patient refuse BIPAP/  Improving.   3-C Diff colitis.  Patient with multiples BM overnight. He report abdominal pain with BM. Leukocytosis this morning.  I have ask nurse to send C diff sample with next BM.  C diff came back positive. Will start vancomycin.  Discontinue cefepime.    4-HTN; permissive HTN in setting of stroke.   5-LE cellulitis; improved.  Check doppler.  Discontinue cefepime due to positive c diff.  Will discontinue doxy.   6-DM; SSI. Hold lantus.  Hypoglycemia; started on d 5. Will discontinue d 5 IV fluids. Started on diet   Right parietal white matter sub acute stroke;  LDL 76 On aspirin and plavix.  HB;A1c; 8.6 Carotid doppler; limited, no significant stenosis.  Neurology following   Acute kidney injury on chronic kidney disease stage III Cr peak to 1.7. Improved with IV fluids.  Cr has decreased to 1.0.  history of PSVT/paroxysmal A. fib Patient currently rate controlled. Patient used to be on eliquis which was reportedly discontinued and patient on Plavix.    DVT prophylaxis: Lovenox Code Status: DNR  Family Communication:  Disposition Plan: transfer to med-surgery  Consultants:   CCM  Neurology    Procedures:   ECHO; normal ef  Doppler. 1-39 %    Antimicrobials:   Doxy   Subjective: He is sitting in the chair, he is more alert. Oriented to place, person and year,.  He has been having abdominal pain and diarrhea since  yesterday  Objective: Vitals:   01/04/17 0003 01/04/17 0324 01/04/17 0755 01/04/17 1057  BP: 128/75 105/76 (!) 116/55 132/69  Pulse: 98 95 99 94  Resp: (!) 26 (!) 23 (!) 24 (!) 24  Temp: 98.5 F (36.9 C) 98.1 F (36.7 C) 99.1 F (37.3 C) 99.1 F (37.3 C)  TempSrc: Oral Oral Oral Oral  SpO2: 94% 95% 100% 98%  Weight:      Height:        Intake/Output Summary (Last 24 hours) at 01/04/17 1320 Last data filed at 01/04/17 0329  Gross per 24 hour  Intake              460 ml  Output              750 ml  Net             -290 ml   Filed Weights   01/01/17 1422 01/01/17 2030  Weight: 119.7 kg (264 lb) 118.5 kg (261 lb 4.8 oz)    Examination:  General exam: Appears calm., Alert  Respiratory system: bilateral ronchus . Respiratory effort normal. Cardiovascular system: S1 & S2 heard, RRR. No JVD, murmurs, rubs, gallops or clicks. No pedal edema. Gastrointestinal system: Abdomen is nondistended, soft and nontender. No organomegaly or masses felt. Normal bowel sounds heard. Central nervous system: Alert and oriented. No focal neurological deficits. Extremities: trace edema., Skin: bilateral lower extremity with erythema.     Data Reviewed: I have personally reviewed following labs and imaging studies  CBC:  Recent Labs Lab 01/01/17 1507 01/02/17 0237 01/03/17 0157 01/04/17 0450  WBC 9.5 8.3 10.3 12.5*  NEUTROABS 6.7  --   --   --   HGB 10.6* 10.3* 11.1* 11.3*  HCT 35.6* 34.9* 37.3* 37.0*  MCV 69.4* 70.2* 70.2* 69.4*  PLT 164 151 136* [id number]*   Basic Metabolic Panel:  Recent Labs Lab 01/01/17 1507 01/02/17 0237 01/03/17 0157 01/04/17 0450  NA 141 143 141 139  K 4.0 3.5 3.6 4.6  CL 97* 98* 98* 101  CO2 37* 35* 31 31  GLUCOSE 110* 84 144* 166*  BUN 18 15 10 9   CREATININE 1.77* 1.45* 1.08 1.03  CALCIUM 9.1 8.8* 8.7* 8.5*   GFR: Estimated Creatinine Clearance: 71.2 mL/min (by C-G formula based on SCr of 1.03 mg/dL). Liver Function Tests:  Recent Labs Lab  01/01/17 1507  AST 24  ALT 19  ALKPHOS 68  BILITOT 0.7  PROT 5.9*  ALBUMIN 2.9*   No results for input(s): LIPASE, AMYLASE in the last 168 hours.  Recent Labs Lab 01/01/17 1507  AMMONIA 19   Coagulation Profile: No results for input(s): INR, PROTIME in the last 168 hours. Cardiac Enzymes: No results for input(s): CKTOTAL, CKMB, CKMBINDEX, TROPONINI in the last 168 hours. BNP (last 3 results) No results for input(s): PROBNP in the last 8760 hours. HbA1C:  Recent Labs  01/02/17 0237  HGBA1C 8.6*   CBG:  Recent Labs Lab 01/03/17 2035 01/04/17 0113 01/04/17 0402 01/04/17 0740 01/04/17 1057  GLUCAP 165* 169* 163* 164* 191*   Lipid Profile:  Recent Labs  01/02/17 0237  CHOL 130  HDL 28*  LDLCALC  76  TRIG 130  CHOLHDL 4.6   Thyroid Function Tests: No results for input(s): TSH, T4TOTAL, FREET4, T3FREE, THYROIDAB in the last 72 hours. Anemia Panel:  Recent Labs  01/02/17 0237  VITAMINB12 1,000*  FOLATE 18.7  FERRITIN 108  TIBC 248*  IRON 47   Sepsis Labs:  Recent Labs Lab 01/01/17 1650  LATICACIDVEN 0.88    Recent Results (from the past 240 hour(s))  Urine culture     Status: None   Collection Time: 01/01/17  5:23 PM  Result Value Ref Range Status   Specimen Description URINE, RANDOM  Final   Special Requests ADDED [id number] [phone]  Final   Culture NO GROWTH  Final   Report Status 01/03/2017 FINAL  Final  MRSA PCR Screening     Status: Abnormal   Collection Time: 01/02/17  2:42 PM  Result Value Ref Range Status   MRSA by PCR POSITIVE (A) NEGATIVE Final    Comment:        The GeneXpert MRSA Assay (FDA approved for NASAL specimens only), is one component of a comprehensive MRSA colonization surveillance program. It is not intended to diagnose MRSA infection nor to guide or monitor treatment for MRSA infections. RESULT CALLED TO, READ BACK BY AND VERIFIED WITH: D.EVERETTE,RN AT 1932 BY L.PITT 01/02/17   C difficile quick scan w PCR reflex      Status: Abnormal   Collection Time: 01/04/17 11:20 AM  Result Value Ref Range Status   C Diff antigen POSITIVE (A) NEGATIVE Final   C Diff toxin POSITIVE (A) NEGATIVE Final   C Diff interpretation Toxin producing C. difficile detected.  Final    Comment: CRITICAL RESULT CALLED TO, READ BACK BY AND VERIFIED WITH: J.COVINGTON RN AT [id number] 01/04/17 BY A.DAVIS          Radiology Studies: Ct Angio Head W Or Wo Contrast  Result Date: 01/03/2017 CLINICAL DATA:  Stroke EXAM: CT ANGIOGRAPHY HEAD AND NECK TECHNIQUE: Multidetector CT imaging of the head and neck was performed using the standard protocol during bolus administration of intravenous contrast. Multiplanar CT image reconstructions and MIPs were obtained to evaluate the vascular anatomy. Carotid stenosis measurements (when applicable) are obtained utilizing NASCET criteria, using the distal internal carotid diameter as the denominator. COMPARISON:  Brain MRI 01/01/2017 FINDINGS: CT HEAD FINDINGS Brain: No mass lesion, intraparenchymal hemorrhage or extra-axial collection. No evidence of acute cortical infarct. There is periventricular hypoattenuation compatible with chronic microvascular disease. Old right cerebellar infarct. Skull: Normal visualized skull base, calvarium and extracranial soft tissues. Sinuses/Orbits: No sinus fluid levels or advanced mucosal thickening. No mastoid effusion. Normal orbits. CTA NECK FINDINGS Aortic arch: There is no aneurysm or dissection of the visualized ascending aorta or aortic arch. There is a normal 3 vessel branching pattern. The visualized proximal subclavian arteries are normal. There is atherosclerotic calcification within the aortic arch. Right carotid system: The right common carotid origin is widely patent. There is no common carotid or internal carotid artery dissection or aneurysm. No hemodynamically significant stenosis. There is mild atherosclerotic calcification at the right carotid bifurcation.  Left carotid system: The left common carotid origin is widely patent. There is no common carotid or internal carotid artery dissection or aneurysm. No hemodynamically significant stenosis. Vertebral arteries: The vertebral system is left dominant. There is atherosclerotic calcification of the left vertebral artery origin, which remains widely patent. Both vertebral arteries are normal to their confluence with the basilar artery. Skeleton: There is no bony spinal canal stenosis. No lytic  or blastic lesions. Other neck: The nasopharynx is clear. The oropharynx and hypopharynx are normal. The epiglottis is normal. The supraglottic larynx, glottis and subglottic larynx are normal. No retropharyngeal collection. The parapharyngeal spaces are preserved. The parotid and submandibular glands are normal. No sialolithiasis or salivary ductal dilatation. The thyroid gland is normal. There is no cervical lymphadenopathy. Upper chest: No pneumothorax or pleural effusion. No nodules or masses. Lingular atelectasis. Review of the MIP images confirms the above findings CTA HEAD FINDINGS Anterior circulation: --Intracranial internal carotid arteries: There is atherosclerotic calcification of the right cavernous and left cavernous and clinoid segments without hemodynamically significant stenosis. --Anterior cerebral arteries: Normal. --Middle cerebral arteries: Normal. --Posterior communicating arteries: Absent bilaterally. Posterior circulation: --Posterior cerebral arteries: Normal. --Superior cerebellar arteries: Normal. --Basilar artery: Normal. --Anterior inferior cerebellar arteries: Normal. --Posterior inferior cerebellar arteries: Normal. Venous sinuses: As permitted by contrast timing, patent. Anatomic variants: None Delayed phase: No parenchymal contrast enhancement. Review of the MIP images confirms the above findings IMPRESSION: 1. No CT evidence of acute intracranial abnormality. The small infarct seen on the prior MRI  is not visible on this study. 2. No occlusion or hemodynamically significant stenosis of the intracranial or cervical arteries. 3. Aortic atherosclerosis. Electronically Signed   By: Ulyses Jarred M.D.   On: 01/03/2017 13:44   Ct Angio Neck W Or Wo Contrast  Result Date: 01/03/2017 CLINICAL DATA:  Stroke EXAM: CT ANGIOGRAPHY HEAD AND NECK TECHNIQUE: Multidetector CT imaging of the head and neck was performed using the standard protocol during bolus administration of intravenous contrast. Multiplanar CT image reconstructions and MIPs were obtained to evaluate the vascular anatomy. Carotid stenosis measurements (when applicable) are obtained utilizing NASCET criteria, using the distal internal carotid diameter as the denominator. COMPARISON:  Brain MRI 01/01/2017 FINDINGS: CT HEAD FINDINGS Brain: No mass lesion, intraparenchymal hemorrhage or extra-axial collection. No evidence of acute cortical infarct. There is periventricular hypoattenuation compatible with chronic microvascular disease. Old right cerebellar infarct. Skull: Normal visualized skull base, calvarium and extracranial soft tissues. Sinuses/Orbits: No sinus fluid levels or advanced mucosal thickening. No mastoid effusion. Normal orbits. CTA NECK FINDINGS Aortic arch: There is no aneurysm or dissection of the visualized ascending aorta or aortic arch. There is a normal 3 vessel branching pattern. The visualized proximal subclavian arteries are normal. There is atherosclerotic calcification within the aortic arch. Right carotid system: The right common carotid origin is widely patent. There is no common carotid or internal carotid artery dissection or aneurysm. No hemodynamically significant stenosis. There is mild atherosclerotic calcification at the right carotid bifurcation. Left carotid system: The left common carotid origin is widely patent. There is no common carotid or internal carotid artery dissection or aneurysm. No hemodynamically  significant stenosis. Vertebral arteries: The vertebral system is left dominant. There is atherosclerotic calcification of the left vertebral artery origin, which remains widely patent. Both vertebral arteries are normal to their confluence with the basilar artery. Skeleton: There is no bony spinal canal stenosis. No lytic or blastic lesions. Other neck: The nasopharynx is clear. The oropharynx and hypopharynx are normal. The epiglottis is normal. The supraglottic larynx, glottis and subglottic larynx are normal. No retropharyngeal collection. The parapharyngeal spaces are preserved. The parotid and submandibular glands are normal. No sialolithiasis or salivary ductal dilatation. The thyroid gland is normal. There is no cervical lymphadenopathy. Upper chest: No pneumothorax or pleural effusion. No nodules or masses. Lingular atelectasis. Review of the MIP images confirms the above findings CTA HEAD FINDINGS Anterior circulation: --Intracranial internal  carotid arteries: There is atherosclerotic calcification of the right cavernous and left cavernous and clinoid segments without hemodynamically significant stenosis. --Anterior cerebral arteries: Normal. --Middle cerebral arteries: Normal. --Posterior communicating arteries: Absent bilaterally. Posterior circulation: --Posterior cerebral arteries: Normal. --Superior cerebellar arteries: Normal. --Basilar artery: Normal. --Anterior inferior cerebellar arteries: Normal. --Posterior inferior cerebellar arteries: Normal. Venous sinuses: As permitted by contrast timing, patent. Anatomic variants: None Delayed phase: No parenchymal contrast enhancement. Review of the MIP images confirms the above findings IMPRESSION: 1. No CT evidence of acute intracranial abnormality. The small infarct seen on the prior MRI is not visible on this study. 2. No occlusion or hemodynamically significant stenosis of the intracranial or cervical arteries. 3. Aortic atherosclerosis.  Electronically Signed   By: Ulyses Jarred M.D.   On: 01/03/2017 13:44        Scheduled Meds: . aspirin  300 mg Rectal Daily   Or  . aspirin  325 mg Oral Daily  . ceFEPime (MAXIPIME) IV  2 g Intravenous Q12H  . Chlorhexidine Gluconate Cloth  6 each Topical Q0600  . clopidogrel  75 mg Oral Daily  . doxycycline  100 mg Oral Q12H  . enoxaparin (LOVENOX) injection  60 mg Subcutaneous Q24H  . fluticasone  1 spray Each Nare Daily  . insulin aspart  0-9 Units Subcutaneous TID WC  . ketorolac  15 mg Intravenous Once  . mupirocin ointment  1 application Nasal BID  . pantoprazole  40 mg Oral Daily  . simvastatin  20 mg Oral QHS  . sodium chloride  500 mL Intravenous Once  . vancomycin  125 mg Oral QID  . vitamin B-12  1,000 mcg Oral Daily   Continuous Infusions:    LOS: 3 days    Time spent: 35 minutes.     Elmarie Shiley, MD [facility] Pager [phone]  If 7PM-7AM, please contact night-coverage [email or web address] Password [email or web address] 01/04/2017, 1:20 PM

## 2017-01-04 NOTE — Clinical Social Work Note (Signed)
CSW left voicemail for patient's son as he was not in patient's room. Will discuss return to [facility] upon discharge once he calls back.  [place] Scrape, [place]

## 2017-01-05 LAB — URINALYSIS, MICROSCOPIC (REFLEX)
Bacteria, UA: NONE SEEN
SQUAMOUS EPITHELIAL / LPF: NONE SEEN

## 2017-01-05 LAB — GLUCOSE, CAPILLARY
GLUCOSE-CAPILLARY: 189 mg/dL — AB (ref 65–99)
GLUCOSE-CAPILLARY: 148 mg/dL — AB (ref 65–99)
Glucose-Capillary: 161 mg/dL — ABNORMAL HIGH (ref 65–99)
Glucose-Capillary: 170 mg/dL — ABNORMAL HIGH (ref 65–99)
Glucose-Capillary: 161 mg/dL — ABNORMAL HIGH (ref 65–99)

## 2017-01-05 LAB — VAS US CAROTID
RCCADSYS: -86 cm/s
RCCAPDIAS: -7 cm/s
RIGHT ECA DIAS: -9 cm/s
Right CCA prox sys: -78 cm/s

## 2017-01-05 LAB — CBC
HEMATOCRIT: 39.2 % (ref 39.0–52.0)
Hemoglobin: 12.1 g/dL — ABNORMAL LOW (ref 13.0–17.0)
MCH: 20.9 pg — ABNORMAL LOW (ref 26.0–34.0)
MCHC: 30.9 g/dL (ref 30.0–36.0)
MCV: 67.7 fL — AB (ref 78.0–100.0)
PLATELETS: 149 10*3/uL — AB (ref 150–400)
RBC: 5.79 MIL/uL (ref 4.22–5.81)
RDW: 15.3 % (ref 11.5–15.5)
WBC: 11.5 10*3/uL — AB (ref 4.0–10.5)

## 2017-01-05 LAB — BASIC METABOLIC PANEL
ANION GAP: 13 (ref 5–15)
BUN: 10 mg/dL (ref 6–20)
CO2: 24 mmol/L (ref 22–32)
Calcium: 9.2 mg/dL (ref 8.9–10.3)
Chloride: 101 mmol/L (ref 101–111)
Creatinine, Ser: 0.96 mg/dL (ref 0.61–1.24)
GFR calc Af Amer: >60 mL/min (ref >60)
GLUCOSE: 184 mg/dL — AB (ref 65–99)
POTASSIUM: 3.5 mmol/L (ref 3.5–5.1)
Sodium: 138 mmol/L (ref 135–145)

## 2017-01-05 LAB — URINALYSIS, ROUTINE W REFLEX MICROSCOPIC
Bilirubin Urine: NEGATIVE
Glucose, UA: NEGATIVE mg/dL
KETONES UR: 40 mg/dL — AB
Nitrite: NEGATIVE
PROTEIN: 30 mg/dL — AB
Specific Gravity, Urine: 1.01 (ref 1.005–1.030)
pH: 7 (ref 5.0–8.0)

## 2017-01-05 MED ORDER — FUROSEMIDE 40 MG PO TABS
80.0000 mg | ORAL_TABLET | Freq: Every day | ORAL | Status: DC
  Administered 2017-01-06 – 2017-01-07 (×2): 80 mg via ORAL
  Filled 2017-01-05 (×2): qty 2

## 2017-01-05 MED ORDER — FUROSEMIDE 40 MG PO TABS
40.0000 mg | ORAL_TABLET | Freq: Every day | ORAL | Status: DC
  Administered 2017-01-05 – 2017-01-09 (×5): 40 mg via ORAL
  Filled 2017-01-05 (×5): qty 1

## 2017-01-05 NOTE — Progress Notes (Signed)
Speech Language Pathology Treatment: Dysphagia  Patient Details Name: Jared Hall MRN: [id number] DOB: 07-Dec-1930 Today's Date: 01/05/2017 Time: [id number] SLP Time Calculation (min) (ACUTE ONLY): 16 min  Assessment / Plan / Recommendation Clinical Impression  Pt demonstrates improved arousal and participation in diagnostic treatment. SLP offered trials of nectar thick liquids with straw, pt took with large consecutive sips with delayed swallow initiation as he attempted to suck more liquid from straw. This was followed by hard, delayed cough, as observed in prior session. When given a cup of thin liquid, pt too small single more controlled sips, no signs of aspiration observed over multiple trials. Pt tolerating soft solids. Recommend upgrade to thin liquids with the restriction of no straws. Discussed this change with the pt who seemed to have some prior awareness of struggle with straws and need for small sips and agreed with plan. SLP will f/u acutely for tolerance.    HPI HPI: 83 y.o.malewith medical history significant of hypertension, hyperlipidemia, chronic kidney disease stage III, prior CVA, CHF with last EF of 55% April 2016, diabetes mellitus, hyperlipidemia who was recently hospitalized from 12/05/2016- 12/15/2016 for bilateral lower extremity cellulitis and edema, acute hypoxic respiratory failure, who presents to the ED from nursing facility at [facility] with a four-day history of slurred speech, progressive confusion. MRI was limited but suggestive of small acute/early subacute small vessel ischemic infarct in the right parietal white matter. Previous MBS 03/16/15 recommended Dys 2 diet and nectar thick liquids due to silent aspiration of thin liquids. Pt admitted wtih Bell's Palsy at that time with incidental finding of left frontal CVA.      SLP Plan  Continue with current plan of care     Recommendations  Diet recommendations: Dysphagia 3 (mechanical soft);Thin liquid Liquids  provided via: Cup;No straw Medication Administration: Whole meds with puree Supervision: Intermittent supervision to cue for compensatory strategies;Patient able to self feed Compensations: Slow rate;Small sips/bites Postural Changes and/or Swallow Maneuvers: Seated upright 90 degrees;Out of bed for meals                Oral Care Recommendations: Oral care BID Follow up Recommendations: Skilled Nursing facility Plan: Continue with current plan of care       [place] Brinsley Wence, MA CCC-SLP [id number]  Lynann Beaver 01/05/2017, 8:52 AM

## 2017-01-05 NOTE — Progress Notes (Signed)
PROGRESS NOTE    JAAMAL SANDRA  [id number] DOB: 06/24/1931 DOA: 01/01/2017 PCP: Henrine Screws, MD   Brief Narrative: PETIE RUDLOFF is a 83 y.o. male with medical history significant of hypertension, hyperlipidemia, chronic kidney disease stage III, prior CVA, CHF with last EF of 55% April 2016, diabetes mellitus, hyperlipidemia who was recently hospitalized from 12/05/2016- 12/15/2016 for bilateral lower extremity cellulitis and edema, acute hypoxic respiratory failure, who presents to the ED from nursing facility at [facility] with a four-day history of slurred speech, progressive confusion. Patient with slurred dysarthric speech with an expressive aphasia and a such history obtained from ED PA and from the chart. Per chart in ED PA it was noted that patient had had a four-day history of altered mental status with slurred speech and also noted to have a left facial droop. ED RN spoke with staff at the nursing home it was noticed that patient began to be more confused on 12/29/2016. The morning of admission patient was noted to be more confused and had multiple falls and only oriented to self. Patient subsequently sent to the ED.   Assessment & Plan:   Principal Problem:   CVA (cerebral vascular accident) ([place]) Active Problems:   History of CVA (cerebrovascular accident)   Dyslipidemia   AKI (acute kidney injury) ([place])   Essential hypertension, benign   Type 1 diabetes mellitus with peripheral circulatory complications (HCC)   Depression   GERD (gastroesophageal reflux disease)   Diabetic neuropathy (HCC)   Venous stasis dermatitis   DM type 2 with diabetic peripheral neuropathy (HCC)   Paroxysmal atrial fibrillation (HCC)   Chronic kidney disease, stage III (moderate)   Morbid obesity (HCC)   Dehydration   Anemia   Altered mental status   Acute respiratory failure with hypercapnia ([place])    1-Acute encephalopathy;  This could be multifactorial, secondary to medications, Vs  stroke vs hypercapnia.  MRI showed stroke. Neurology consulted. Neurology doesn't think that MRI finding explain patient mental status.  Holding sedative; gabapentin, amitryptiline, bupropion.  EEG ordered. Unable to be perform, patient not cooperative with test  Ammonia normal at 19.  UA negative, Chest x ray ; atelectasis vs infiltrates lingula.  blood culture no growth to date.    2-Acute on chronic hypercapnic respiratory failure.  Transfer t step down unit.  CCM consulted. BIPAP ordered. Patient refuse BIPAP/  Improving.   3-C Diff colitis.  Patient with multiples BM overnight. He report abdominal pain with BM. Leukocytosis this morning.  I have ask nurse to send C diff sample with next BM.  C diff came back positive. Will start vancomycin.  Discontinue cefepime.    4-HTN; Resume lasix.   5-LE cellulitis; improved.  Discontinue cefepime due to positive c diff.  discontinue doxy.   6-DM; SSI. Hold lantus.  Hypoglycemia; resolved/   Right parietal white matter sub acute stroke;  LDL 76 On aspirin and plavix.  HB;A1c; 8.6 Carotid doppler; limited, no significant stenosis.    Acute kidney injury on chronic kidney disease stage III Cr peak to 1.7. Improved with IV fluids.  Cr has decreased to 1.0.  history of PSVT/paroxysmal A. fib Patient currently rate controlled. Patient used to be on eliquis which was reportedly discontinued and patient on Plavix.  Hematuria; hold lovenox. Check UA. Resume condom cath    DVT prophylaxis: Lovenox Code Status: DNR  Family Communication:  Disposition Plan: transfer to med-surgery    Consultants:   CCM  Neurology  Procedures:   ECHO; normal ef  Doppler. 1-39 %    Antimicrobials:   Doxy   Subjective: He is alert and oriented. Diarrhea improved.  Develops hematuria today.   Objective: Vitals:   01/04/17 2040 01/04/17 2355 01/05/17 0319 01/05/17 0823  BP: (!) 111/58 119/66 (!) 156/84 (!) 168/81  Pulse:  90 (!) 102  (!) 106  Resp: 18 20 (!) 27 (!) 28  Temp: 99 F (37.2 C) 98.8 F (37.1 C) 99.5 F (37.5 C) 98.7 F (37.1 C)  TempSrc: Oral Oral Axillary Oral  SpO2: 95% 92% 96% 94%  Weight:      Height:        Intake/Output Summary (Last 24 hours) at 01/05/17 1331 Last data filed at 01/05/17 1100  Gross per 24 hour  Intake              300 ml  Output              650 ml  Net             -350 ml   Filed Weights   01/01/17 1422 01/01/17 2030  Weight: 119.7 kg (264 lb) 118.5 kg (261 lb 4.8 oz)    Examination:  General exam: Appears calm., Alert  Respiratory system: bilateral ronchus . Respiratory effort normal. Cardiovascular system: S1 & S2 heard, RRR. No JVD, murmurs, rubs, gallops or clicks. No pedal edema. Gastrointestinal system: Abdomen is nondistended, soft and nontender. No organomegaly or masses felt. Normal bowel sounds heard. Central nervous system: Alert and oriented. No focal neurological deficits. Extremities: trace edema., Skin: bilateral lower extremity with erythema.     Data Reviewed: I have personally reviewed following labs and imaging studies  CBC:  Recent Labs Lab 01/01/17 1507 01/02/17 0237 01/03/17 0157 01/04/17 0450 01/05/17 0917  WBC 9.5 8.3 10.3 12.5* 11.5*  NEUTROABS 6.7  --   --   --   --   HGB 10.6* 10.3* 11.1* 11.3* 12.1*  HCT 35.6* 34.9* 37.3* 37.0* 39.2  MCV 69.4* 70.2* 70.2* 69.4* 67.7*  PLT 164 151 136* 135* [id number]*   Basic Metabolic Panel:  Recent Labs Lab 01/01/17 1507 01/02/17 0237 01/03/17 0157 01/04/17 0450 01/05/17 0917  NA 141 143 141 139 138  K 4.0 3.5 3.6 4.6 3.5  CL 97* 98* 98* 101 101  CO2 37* 35* 31 31 24   GLUCOSE 110* 84 144* 166* 184*  BUN 18 15 10 9 10   CREATININE 1.77* 1.45* 1.08 1.03 0.96  CALCIUM 9.1 8.8* 8.7* 8.5* 9.2   GFR: Estimated Creatinine Clearance: 76.4 mL/min (by C-G formula based on SCr of 0.96 mg/dL). Liver Function Tests:  Recent Labs Lab 01/01/17 1507  AST 24  ALT 19  ALKPHOS 68    BILITOT 0.7  PROT 5.9*  ALBUMIN 2.9*   No results for input(s): LIPASE, AMYLASE in the last 168 hours.  Recent Labs Lab 01/01/17 1507  AMMONIA 19   Coagulation Profile: No results for input(s): INR, PROTIME in the last 168 hours. Cardiac Enzymes: No results for input(s): CKTOTAL, CKMB, CKMBINDEX, TROPONINI in the last 168 hours. BNP (last 3 results) No results for input(s): PROBNP in the last 8760 hours. HbA1C: No results for input(s): HGBA1C in the last 72 hours. CBG:  Recent Labs Lab 01/04/17 2037 01/05/17 0000 01/05/17 0316 01/05/17 0822 01/05/17 1208  GLUCAP 144* 172* 161* 170* 189*   Lipid Profile: No results for input(s): CHOL, HDL, LDLCALC, TRIG, CHOLHDL, LDLDIRECT in the last 72 hours. Thyroid  Function Tests: No results for input(s): TSH, T4TOTAL, FREET4, T3FREE, THYROIDAB in the last 72 hours. Anemia Panel: No results for input(s): VITAMINB12, FOLATE, FERRITIN, TIBC, IRON, RETICCTPCT in the last 72 hours. Sepsis Labs:  Recent Labs Lab 01/01/17 1650  LATICACIDVEN 0.88    Recent Results (from the past 240 hour(s))  Urine culture     Status: None   Collection Time: 01/01/17  5:23 PM  Result Value Ref Range Status   Specimen Description URINE, RANDOM  Final   Special Requests ADDED [id number] [phone]  Final   Culture NO GROWTH  Final   Report Status 01/03/2017 FINAL  Final  MRSA PCR Screening     Status: Abnormal   Collection Time: 01/02/17  2:42 PM  Result Value Ref Range Status   MRSA by PCR POSITIVE (A) NEGATIVE Final    Comment:        The GeneXpert MRSA Assay (FDA approved for NASAL specimens only), is one component of a comprehensive MRSA colonization surveillance program. It is not intended to diagnose MRSA infection nor to guide or monitor treatment for MRSA infections. RESULT CALLED TO, READ BACK BY AND VERIFIED WITH: D.EVERETTE,RN AT 1932 BY L.PITT 01/02/17   Culture, blood (routine x 2)     Status: None (Preliminary result)   Collection  Time: 01/03/17  8:35 AM  Result Value Ref Range Status   Specimen Description BLOOD LEFT ARM  Final   Special Requests BOTTLES DRAWN AEROBIC AND ANAEROBIC 5CC EA  Final   Culture NO GROWTH 2 DAYS  Final   Report Status PENDING  Incomplete  Culture, blood (routine x 2)     Status: None (Preliminary result)   Collection Time: 01/03/17  8:38 AM  Result Value Ref Range Status   Specimen Description BLOOD LEFT ARM  Final   Special Requests IN PEDIATRIC BOTTLE 2CC  Final   Culture NO GROWTH 2 DAYS  Final   Report Status PENDING  Incomplete  C difficile quick scan w PCR reflex     Status: Abnormal   Collection Time: 01/04/17 11:20 AM  Result Value Ref Range Status   C Diff antigen POSITIVE (A) NEGATIVE Final   C Diff toxin POSITIVE (A) NEGATIVE Final   C Diff interpretation Toxin producing C. difficile detected.  Final    Comment: CRITICAL RESULT CALLED TO, READ BACK BY AND VERIFIED WITH: J.COVINGTON RN AT 1249 01/04/17 BY A.DAVIS          Radiology Studies: No results found.      Scheduled Meds: . aspirin  300 mg Rectal Daily   Or  . aspirin  325 mg Oral Daily  . Chlorhexidine Gluconate Cloth  6 each Topical Q0600  . clopidogrel  75 mg Oral Daily  . fluticasone  1 spray Each Nare Daily  . insulin aspart  0-9 Units Subcutaneous TID WC  . ketorolac  15 mg Intravenous Once  . mupirocin ointment  1 application Nasal BID  . pantoprazole  40 mg Oral Daily  . simvastatin  20 mg Oral QHS  . sodium chloride  500 mL Intravenous Once  . vancomycin  125 mg Oral QID  . vitamin B-12  1,000 mcg Oral Daily   Continuous Infusions:    LOS: 4 days    Time spent: 35 minutes.     Elmarie Shiley, MD [facility] Pager [phone]  If 7PM-7AM, please contact night-coverage [email or web address] Password [email or web address] 01/05/2017, 1:31 PM

## 2017-01-05 NOTE — Clinical Social Work Note (Signed)
Patient has a bed at [facility] when stable for discharge.  Jared Hall, [name]

## 2017-01-05 NOTE — Clinical Social Work Note (Signed)
Clinical Social Work Assessment  Patient Details  Name: Jared Hall MRN: [id number] Date of Birth: 1931/04/10  Date of referral:  01/05/17               Reason for consult:  Facility Placement, Discharge Planning                Permission sought to share information with:  Facility Sport and exercise psychologist, Family Supports Permission granted to share information::  Yes, Verbal Permission Granted  Name::     Jared Hall  Agency::  SNF's  Relationship::  Son  Contact Information:  [phone]  Housing/Transportation Living arrangements for the past 2 months:  [place] of Information:  Patient, Medical Team Patient Interpreter Needed:  None Criminal Activity/Legal Involvement Pertinent to Current Situation/Hospitalization:  No - Comment as needed Significant Relationships:  Adult Children Lives with:  Facility Resident Do you feel safe going back to the place where you live?  Yes Need for family participation in patient care:  Yes (Comment)  Care giving concerns:  Patient was admitted from [facility] PT recommending SNF once medically stable for discharge.   Social Worker assessment / plan:  CSW met with patient. Per RN, he is much more oriented today. No supports at bedside. CSW introduced role and explained that PT recommendations would be discussed. Patient would like to return to [facility] if they have a bed for him. Patient gave permission for CSW to contact his son. Per RN, patient might be discharging today. No further concerns. CSW encouraged patient to contact CSW as needed. CSW will continue to follow patient for support and facilitate discharge to SNF once medically stable.  Employment status:  Retired Forensic scientist:  Medicare PT Recommendations:  [place] / Referral to community resources:  [place]  Patient/Family's Response to care:  Patient agreeable to return to [employer]. Patient's son  supportive and involved in patient's care. Patient appreciated social work intervention.  Patient/Family's Understanding of and Emotional Response to Diagnosis, Current Treatment, and Prognosis:  Patient appears to have a good understanding of the reason for his admission, stating that he had a stroke. Patient appears happy with hospital care.  Emotional Assessment Appearance:  Appears stated age Attitude/Demeanor/Rapport:  Other (Pleasant) Affect (typically observed):  Accepting, Appropriate, Calm, Pleasant Orientation:  Oriented to Self, Oriented to Place, Oriented to  Time, Oriented to Situation Alcohol / Substance use:  Never Used Psych involvement (Current and /or in the community):  No (Comment)  Discharge Needs  Concerns to be addressed:  Care Coordination Readmission within the last 30 days:  Yes Current discharge risk:  Dependent with Mobility Barriers to Discharge:  Continued Medical Work up   Candie Chroman, LCSW 01/05/2017, 11:15 AM

## 2017-01-06 LAB — BASIC METABOLIC PANEL
ANION GAP: 15 (ref 5–15)
BUN: 11 mg/dL (ref 6–20)
CO2: 26 mmol/L (ref 22–32)
Calcium: 9.6 mg/dL (ref 8.9–10.3)
Chloride: 99 mmol/L — ABNORMAL LOW (ref 101–111)
Creatinine, Ser: 1.1 mg/dL (ref 0.61–1.24)
GFR calc Af Amer: >60 mL/min (ref >60)
Glucose, Bld: 164 mg/dL — ABNORMAL HIGH (ref 65–99)
POTASSIUM: 4 mmol/L (ref 3.5–5.1)
SODIUM: 140 mmol/L (ref 135–145)

## 2017-01-06 LAB — GLUCOSE, CAPILLARY
GLUCOSE-CAPILLARY: 163 mg/dL — AB (ref 65–99)
GLUCOSE-CAPILLARY: 168 mg/dL — AB (ref 65–99)
Glucose-Capillary: 141 mg/dL — ABNORMAL HIGH (ref 65–99)
Glucose-Capillary: 175 mg/dL — ABNORMAL HIGH (ref 65–99)
Glucose-Capillary: 178 mg/dL — ABNORMAL HIGH (ref 65–99)
Glucose-Capillary: 156 mg/dL — ABNORMAL HIGH (ref 65–99)
Glucose-Capillary: 206 mg/dL — ABNORMAL HIGH (ref 65–99)

## 2017-01-06 LAB — CBC
HCT: 40.9 % (ref 39.0–52.0)
Hemoglobin: 13 g/dL (ref 13.0–17.0)
MCH: 21.5 pg — ABNORMAL LOW (ref 26.0–34.0)
MCHC: 31.8 g/dL (ref 30.0–36.0)
MCV: 67.6 fL — AB (ref 78.0–100.0)
PLATELETS: 137 10*3/uL — AB (ref 150–400)
RBC: 6.05 MIL/uL — AB (ref 4.22–5.81)
RDW: 15.8 % — ABNORMAL HIGH (ref 11.5–15.5)
WBC: 13.1 10*3/uL — AB (ref 4.0–10.5)

## 2017-01-06 NOTE — Progress Notes (Signed)
PROGRESS NOTE    Jared Hall  [id number] DOB: July 18, 1931 DOA: 01/01/2017 PCP: Henrine Screws, MD   Brief Narrative: Jared Hall is a 83 y.o. male with medical history significant of hypertension, hyperlipidemia, chronic kidney disease stage III, prior CVA, CHF with last EF of 55% April 2016, diabetes mellitus, hyperlipidemia who was recently hospitalized from 12/05/2016- 12/15/2016 for bilateral lower extremity cellulitis and edema, acute hypoxic respiratory failure, who presents to the ED from nursing facility at [facility] with a four-day history of slurred speech, progressive confusion. Patient with slurred dysarthric speech with an expressive aphasia and a such history obtained from ED PA and from the chart. Per chart in ED PA it was noted that patient had had a four-day history of altered mental status with slurred speech and also noted to have a left facial droop. ED RN spoke with staff at the nursing home it was noticed that patient began to be more confused on 12/29/2016. The morning of admission patient was noted to be more confused and had multiple falls and only oriented to self. Patient subsequently sent to the ED.   Assessment & Plan:   Principal Problem:   CVA (cerebral vascular accident) ([place]) Active Problems:   History of CVA (cerebrovascular accident)   Dyslipidemia   AKI (acute kidney injury) ([place])   Essential hypertension, benign   Type 1 diabetes mellitus with peripheral circulatory complications (HCC)   Depression   GERD (gastroesophageal reflux disease)   Diabetic neuropathy (HCC)   Venous stasis dermatitis   DM type 2 with diabetic peripheral neuropathy (HCC)   Paroxysmal atrial fibrillation (HCC)   Chronic kidney disease, stage III (moderate)   Morbid obesity (HCC)   Dehydration   Anemia   Altered mental status   Acute respiratory failure with hypercapnia ([place])    1-Acute encephalopathy;  This could be multifactorial, secondary to medications, Vs  stroke vs hypercapnia.  MRI showed stroke. Neurology consulted. Neurology doesn't think that MRI finding explain patient mental status.  Holding sedative; gabapentin, amitryptiline, bupropion.  EEG ordered. Unable to be perform, patient not cooperative with test  Ammonia normal at 19.  UA negative, Chest x ray ; atelectasis vs infiltrates lingula.  blood culture no growth to date.    2-Acute on chronic hypercapnic respiratory failure.  Transfer t step down unit.  CCM consulted. BIPAP ordered. Patient refuse BIPAP/  Improving.  Resume lasix.   3-C Diff colitis.  Patient with multiples BM overnight. He report abdominal pain with BM. Leukocytosis this morning.  I have ask nurse to send C diff sample with next BM.  C diff came back positive. Will start vancomycin.  Discontinue cefepime.  WBC increasing, will keep overnight   4-HTN; Resume lasix.   5-LE cellulitis; improved.  Discontinue cefepime due to positive c diff.  discontinue doxy.  Monitor off Antibiotics. Follow WBC.   6-DM; SSI. Hold lantus.  Hypoglycemia; resolved/   Right parietal white matter sub acute stroke;  LDL 76 On aspirin and plavix.  HB;A1c; 8.6 Carotid doppler; limited, no significant stenosis.    Acute kidney injury on chronic kidney disease stage III Cr peak to 1.7. Improved with IV fluids.  Cr has decreased to 1.0.  history of PSVT/paroxysmal A. fib Patient currently rate controlled. Patient used to be on eliquis which was reportedly discontinued and patient on Plavix.  Hematuria; hold lovenox. Check UA nitrates negative no WBC Clearing.   DVT prophylaxis: Lovenox Code Status: DNR  Family Communication: family updated 2-15  Disposition Plan:    Consultants:   CCM  Neurology    Procedures:   ECHO; normal ef  Doppler. 1-39 %    Antimicrobials:   Doxy   Subjective: He is alert and oriented. He denies abdominal pain. Diarrhea has improved.    Objective: Vitals:   01/05/17  1954 01/06/17 0022 01/06/17 0407 01/06/17 0731  BP: (!) 149/74 (!) 159/80 (!) 160/84 (!) 154/81  Pulse: 99 (!) 107 100 (!) 105  Resp: (!) 27 19 (!) 21 (!) 26  Temp: 98.1 F (36.7 C) 98.6 F (37 C) 98.5 F (36.9 C) 98.7 F (37.1 C)  TempSrc: Oral Oral Oral Oral  SpO2: 99% 99% 98%   Weight:      Height:        Intake/Output Summary (Last 24 hours) at 01/06/17 0827 Last data filed at 01/06/17 0400  Gross per 24 hour  Intake              240 ml  Output              900 ml  Net             -660 ml   Filed Weights   01/01/17 1422 01/01/17 2030  Weight: 119.7 kg (264 lb) 118.5 kg (261 lb 4.8 oz)    Examination:  General exam: Appears calm., Alert  Respiratory system: bilateral ronchus . Respiratory effort normal. Cardiovascular system: S1 & S2 heard, RRR. No JVD, murmurs, rubs, gallops or clicks. No pedal edema. Gastrointestinal system: Abdomen is nondistended, soft and nontender. No organomegaly or masses felt. Normal bowel sounds heard. Central nervous system: Alert and oriented. No focal neurological deficits. Extremities: trace edema., Skin: bilateral lower extremity with erythema.     Data Reviewed: I have personally reviewed following labs and imaging studies  CBC:  Recent Labs Lab 01/01/17 1507 01/02/17 0237 01/03/17 0157 01/04/17 0450 01/05/17 0917 01/06/17 0329  WBC 9.5 8.3 10.3 12.5* 11.5* 13.1*  NEUTROABS 6.7  --   --   --   --   --   HGB 10.6* 10.3* 11.1* 11.3* 12.1* 13.0  HCT 35.6* 34.9* 37.3* 37.0* 39.2 40.9  MCV 69.4* 70.2* 70.2* 69.4* 67.7* 67.6*  PLT 164 151 136* 135* 149* 0000000*   Basic Metabolic Panel:  Recent Labs Lab 01/02/17 0237 01/03/17 0157 01/04/17 0450 01/05/17 0917 01/06/17 0329  NA 143 141 139 138 140  K 3.5 3.6 4.6 3.5 4.0  CL 98* 98* 101 101 99*  CO2 35* 31 31 24 26   GLUCOSE 84 144* 166* 184* 164*  BUN 15 10 9 10 11   CREATININE 1.45* 1.08 1.03 0.96 1.10  CALCIUM 8.8* 8.7* 8.5* 9.2 9.6   GFR: Estimated Creatinine  Clearance: 66.6 mL/min (by C-G formula based on SCr of 1.1 mg/dL). Liver Function Tests:  Recent Labs Lab 01/01/17 1507  AST 24  ALT 19  ALKPHOS 68  BILITOT 0.7  PROT 5.9*  ALBUMIN 2.9*   No results for input(s): LIPASE, AMYLASE in the last 168 hours.  Recent Labs Lab 01/01/17 1507  AMMONIA 19   Coagulation Profile: No results for input(s): INR, PROTIME in the last 168 hours. Cardiac Enzymes: No results for input(s): CKTOTAL, CKMB, CKMBINDEX, TROPONINI in the last 168 hours. BNP (last 3 results) No results for input(s): PROBNP in the last 8760 hours. HbA1C: No results for input(s): HGBA1C in the last 72 hours. CBG:  Recent Labs Lab 01/05/17 1625 01/05/17 1952 01/06/17 0020 01/06/17 0406 01/06/17 [id number]  GLUCAP 148* 161* 163* 141* 175*   Lipid Profile: No results for input(s): CHOL, HDL, LDLCALC, TRIG, CHOLHDL, LDLDIRECT in the last 72 hours. Thyroid Function Tests: No results for input(s): TSH, T4TOTAL, FREET4, T3FREE, THYROIDAB in the last 72 hours. Anemia Panel: No results for input(s): VITAMINB12, FOLATE, FERRITIN, TIBC, IRON, RETICCTPCT in the last 72 hours. Sepsis Labs:  Recent Labs Lab 01/01/17 1650  LATICACIDVEN 0.88    Recent Results (from the past 240 hour(s))  Urine culture     Status: None   Collection Time: 01/01/17  5:23 PM  Result Value Ref Range Status   Specimen Description URINE, RANDOM  Final   Special Requests ADDED [id number] [phone]  Final   Culture NO GROWTH  Final   Report Status 01/03/2017 FINAL  Final  MRSA PCR Screening     Status: Abnormal   Collection Time: 01/02/17  2:42 PM  Result Value Ref Range Status   MRSA by PCR POSITIVE (A) NEGATIVE Final    Comment:        The GeneXpert MRSA Assay (FDA approved for NASAL specimens only), is one component of a comprehensive MRSA colonization surveillance program. It is not intended to diagnose MRSA infection nor to guide or monitor treatment for MRSA infections. RESULT CALLED TO,  READ BACK BY AND VERIFIED WITH: D.EVERETTE,RN AT 1932 BY L.PITT 01/02/17   Culture, blood (routine x 2)     Status: None (Preliminary result)   Collection Time: 01/03/17  8:35 AM  Result Value Ref Range Status   Specimen Description BLOOD LEFT ARM  Final   Special Requests BOTTLES DRAWN AEROBIC AND ANAEROBIC 5CC EA  Final   Culture NO GROWTH 2 DAYS  Final   Report Status PENDING  Incomplete  Culture, blood (routine x 2)     Status: None (Preliminary result)   Collection Time: 01/03/17  8:38 AM  Result Value Ref Range Status   Specimen Description BLOOD LEFT ARM  Final   Special Requests IN PEDIATRIC BOTTLE 2CC  Final   Culture NO GROWTH 2 DAYS  Final   Report Status PENDING  Incomplete  C difficile quick scan w PCR reflex     Status: Abnormal   Collection Time: 01/04/17 11:20 AM  Result Value Ref Range Status   C Diff antigen POSITIVE (A) NEGATIVE Final   C Diff toxin POSITIVE (A) NEGATIVE Final   C Diff interpretation Toxin producing C. difficile detected.  Final    Comment: CRITICAL RESULT CALLED TO, READ BACK BY AND VERIFIED WITH: J.COVINGTON RN AT 1249 01/04/17 BY A.DAVIS          Radiology Studies: No results found.      Scheduled Meds: . aspirin  300 mg Rectal Daily   Or  . aspirin  325 mg Oral Daily  . Chlorhexidine Gluconate Cloth  6 each Topical Q0600  . clopidogrel  75 mg Oral Daily  . fluticasone  1 spray Each Nare Daily  . furosemide  40 mg Oral q1800  . furosemide  80 mg Oral Q breakfast  . insulin aspart  0-9 Units Subcutaneous TID WC  . ketorolac  15 mg Intravenous Once  . mupirocin ointment  1 application Nasal BID  . pantoprazole  40 mg Oral Daily  . simvastatin  20 mg Oral QHS  . sodium chloride  500 mL Intravenous Once  . vancomycin  125 mg Oral QID  . vitamin B-12  1,000 mcg Oral Daily   Continuous Infusions:    LOS:  5 days    Time spent: 35 minutes.     Elmarie Shiley, MD [facility] Pager [phone]  If 7PM-7AM,  please contact night-coverage [email or web address] Password [facility] 01/06/2017, 8:27 AM

## 2017-01-06 NOTE — Progress Notes (Signed)
Pt refuses to eat breakfast/lunch and dinner. Pt states he is not hungry. Pt would eat applesauce with medications and did drink a glucerna. Consuelo Pandy RN

## 2017-01-06 NOTE — Evaluation (Signed)
Physical Therapy Evaluation Patient Details Name: Jared Hall MRN: [id number] DOB: [birth year] Today's Date: 01/06/2017   History of Present Illness  83 y.o. male with medical history significant of hypertension, hyperlipidemia, chronic kidney disease stage III, prior CVA, CHF with last EF of 55% April 2016, diabetes mellitus, hyperlipidemia who was recently hospitalized from 12/05/2016- 12/15/2016 for bilateral lower extremity cellulitis and edema, acute hypoxic respiratory failure, who presents to the ED from nursing facility at [facility] with a four-day history of slurred speech, progressive confusion. MRI was limited but suggestive of small acute/early subacute small vessel ischemic infarct in the right parietal white matter  Clinical Impression  Pt presented sitting OOB in recliner when PT entered room. Pt very lethargic initially and then able to arouse to adjust himself in recliner chair as he was positioned very low in chair. Pt then only agreeable to standing from chair x2 with mod A and RW. All VSS throughout. Pt would continue to benefit from skilled physical therapy services at this time while admitted and after d/c to address his limitations in order to improve his overall safety and independence with functional mobility.      Follow Up Recommendations SNF;Supervision/Assistance - 24 hour    Equipment Recommendations  None recommended by PT    Recommendations for Other Services       Precautions / Restrictions Precautions Precautions: Fall Precaution Comments: HOH; monitor vitals Restrictions Weight Bearing Restrictions: No      Mobility  Bed Mobility               General bed mobility comments: pt sitting OOB in recliner when PT entered room  Transfers Overall transfer level: Needs assistance Equipment used: Rolling walker (2 wheeled) Transfers: Sit to/from Stand Sit to Stand: Mod assist         General transfer comment: pt required mod A to rise from  recliner, performed sit-to-stand from recliner x2  Ambulation/Gait             General Gait Details: deferred due to pt lethargic  Stairs            Wheelchair Mobility    Modified Rankin (Stroke Patients Only)       Balance Overall balance assessment: Needs assistance Sitting-balance support: Feet supported Sitting balance-Leahy Scale: Fair     Standing balance support: During functional activity;Bilateral upper extremity supported Standing balance-Leahy Scale: Poor Standing balance comment: pt reliant on bilateral UE supports on RW                             Pertinent Vitals/Pain Pain Assessment: No/denies pain    Home Living                        Prior Function                 Hand Dominance        Extremity/Trunk Assessment                Communication      Cognition Arousal/Alertness: Lethargic Behavior During Therapy: WFL for tasks assessed/performed Overall Cognitive Status: Impaired/Different from baseline Area of Impairment: Following commands;Safety/judgement;Problem solving       Following Commands: Follows one step commands with increased time;Follows one step commands consistently Safety/Judgement: Decreased awareness of safety;Decreased awareness of deficits   Problem Solving: Difficulty sequencing;Slow processing;Requires verbal cues;Requires tactile cues  General Comments      Exercises General Exercises - Lower Extremity Long Arc Quad: AROM;Both;5 reps;Seated;Other (comment) (with 5 second holds)   Assessment/Plan    PT Assessment    PT Problem List            PT Treatment Interventions      PT Goals (Current goals can be found in the Care Plan section)  Acute Rehab PT Goals Patient Stated Goal: to return to [facility] for rehab PT Goal Formulation: With patient Time For Goal Achievement: 01/17/17 Potential to Achieve Goals: Fair    Frequency Min 2X/week   Barriers  to discharge        Co-evaluation               End of Session Equipment Utilized During Treatment: Gait belt Activity Tolerance: Patient limited by lethargy;Patient limited by fatigue Patient left: in chair;with call bell/phone within reach;with chair alarm set;Other (comment) Oncologist) Nurse Communication: Mobility status         Time: [phone] PT Time Calculation (min) (ACUTE ONLY): 24 min   Charges:     PT Treatments $Therapeutic Activity: 23-37 mins   PT G CodesClearnce Sorrel Emmitt Matthews 01/06/2017, 3:01 PM Sherie Don, [place], DPT [phone]

## 2017-01-07 ENCOUNTER — Inpatient Hospital Stay (HOSPITAL_COMMUNITY): Payer: Medicare Other

## 2017-01-07 LAB — CBC
HCT: 39.9 % (ref 39.0–52.0)
HEMOGLOBIN: 12.5 g/dL — AB (ref 13.0–17.0)
MCH: 21.2 pg — AB (ref 26.0–34.0)
MCHC: 31.3 g/dL (ref 30.0–36.0)
MCV: 67.7 fL — AB (ref 78.0–100.0)
Platelets: 142 10*3/uL — ABNORMAL LOW (ref 150–400)
RBC: 5.89 MIL/uL — AB (ref 4.22–5.81)
RDW: 15.7 % — ABNORMAL HIGH (ref 11.5–15.5)
WBC: 12.2 10*3/uL — ABNORMAL HIGH (ref 4.0–10.5)

## 2017-01-07 LAB — BLOOD GAS, ARTERIAL
ACID-BASE EXCESS: 6.3 mmol/L — AB (ref 0.0–2.0)
Bicarbonate: 30.7 mmol/L — ABNORMAL HIGH (ref 20.0–28.0)
DRAWN BY: 10552
FIO2: 21
O2 Saturation: 93.4 %
PCO2 ART: 47.3 mmHg (ref 32.0–48.0)
PH ART: 7.428 (ref 7.350–7.450)
Patient temperature: 98.6
pO2, Arterial: 69.7 mmHg — ABNORMAL LOW (ref 83.0–108.0)

## 2017-01-07 LAB — GLUCOSE, CAPILLARY
GLUCOSE-CAPILLARY: 188 mg/dL — AB (ref 65–99)
GLUCOSE-CAPILLARY: 191 mg/dL — AB (ref 65–99)
GLUCOSE-CAPILLARY: 275 mg/dL — AB (ref 65–99)
GLUCOSE-CAPILLARY: 220 mg/dL — AB (ref 65–99)
Glucose-Capillary: 178 mg/dL — ABNORMAL HIGH (ref 65–99)
Glucose-Capillary: 214 mg/dL — ABNORMAL HIGH (ref 65–99)

## 2017-01-07 LAB — BASIC METABOLIC PANEL
ANION GAP: 13 (ref 5–15)
BUN: 16 mg/dL (ref 6–20)
CHLORIDE: 96 mmol/L — AB (ref 101–111)
CO2: 30 mmol/L (ref 22–32)
Calcium: 9.2 mg/dL (ref 8.9–10.3)
Creatinine, Ser: 1.24 mg/dL (ref 0.61–1.24)
GFR calc Af Amer: >60 mL/min (ref >60)
GFR, EST NON AFRICAN AMERICAN: 52 mL/min — AB (ref >60)
GLUCOSE: 193 mg/dL — AB (ref 65–99)
POTASSIUM: 3.5 mmol/L (ref 3.5–5.1)
Sodium: 139 mmol/L (ref 135–145)

## 2017-01-07 MED ORDER — ENOXAPARIN SODIUM 40 MG/0.4ML ~~LOC~~ SOLN
40.0000 mg | SUBCUTANEOUS | Status: DC
  Administered 2017-01-07: 40 mg via SUBCUTANEOUS
  Filled 2017-01-07: qty 0.4

## 2017-01-07 NOTE — Consult Note (Signed)
Reason for Consult: Mental status change Referring Physician: Dr. [name] is an 83 y.o. male.  HPI: Patient was recently admitted and discharged for bilateral LE cellulitis with cultures positive for pseudomonas aeroginosa.  He came back for some mental status changes and lethargy.  MRI Brain was normal except for periventricular small vessel ischemic disease.  There was a very tiny DWI high signal abnormality adjacent to the occipital horn of the right lateral ventricle which may have been artifact, but if real was not causing and symptoms.  CTA  Brain and Neck are negative.    He is positive for C. Difficile and WBC 12.  ABG and BMP are normal except for glucose 193.    Past Medical History:  Diagnosis Date  . Alpha thalassemia ([place])   . Anemia 01/01/2017  . BPH (benign prostatic hyperplasia)   . CHF (congestive heart failure) ([place])   . High cholesterol   . Hypertension   . Lower extremity edema   . Onychomycosis   . Prostate cancer ([place])    S/P "8 weeks of radiation"  . Prostatitis    recurrent  . Sleep apnea   . Stroke [facility]) ~ 2005   denies residual on 2/123/2015  . Type II diabetes mellitus ([place])   . Umbilical hernia   . Urinary urgency    with incontinence    Past Surgical History:  Procedure Laterality Date  . ADENOIDECTOMY    . HERNIA REPAIR    . MASTOIDECTOMY    . TONSILLECTOMY    . VASECTOMY    . VENTRAL HERNIA REPAIR  12/18/2011   Procedure: HERNIA REPAIR VENTRAL ADULT;  Surgeon: Adin Hector, MD;  Location: [facility];  Service: General;  Laterality: N/A;    Family History  Problem Relation Age of Onset  . Pneumonia Mother   . Heart attack Father     Social History:  reports that he has never smoked. He has never used smokeless tobacco. He reports that he does not drink alcohol or use drugs.  Allergies:  Allergies  Allergen Reactions  . Ativan [Lorazepam] Other (See Comments)    Flailing of extremities noted immediately s/p IV  administration.  [name] Kitchen Flomax [Tamsulosin Hcl] Other (See Comments)    Excessive tearing  . Glucophage [Metformin Hcl] Diarrhea    Prior to Admission medications   Medication Sig Start Date End Date Taking? Authorizing Provider  amitriptyline (ELAVIL) 25 MG tablet Take 25 mg by mouth at bedtime.   Yes Historical Provider, MD  aspirin EC 81 MG tablet Take 81 mg by mouth daily.   Yes Historical Provider, MD  B Complex Vitamins (VITAMIN B COMPLEX PO) Take 1 tablet by mouth daily.   Yes Historical Provider, MD  buPROPion (WELLBUTRIN SR) 150 MG 12 hr tablet Take 150 mg by mouth daily. 11/17/16  Yes Historical Provider, MD  clopidogrel (PLAVIX) 75 MG tablet Take 75 mg by mouth daily.    Yes Historical Provider, MD  clotrimazole-betamethasone (LOTRISONE) lotion Apply 1 application topically 2 (two) times daily as needed (antifungal).    Yes Historical Provider, MD  fluticasone (FLONASE) 50 MCG/ACT nasal spray Place 1 spray into both nostrils daily. 03/17/15  Yes Kelvin Cellar, MD  furosemide (LASIX) 40 MG tablet Take 40-80 mg by mouth daily. Take 80 mg QAM and 40 mg QPM for CHF   Yes Historical Provider, MD  gabapentin (NEURONTIN) 300 MG capsule Take 300 mg by mouth 3 (three) times daily.   Yes  Historical Provider, MD  guaiFENesin (MUCINEX) 600 MG 12 hr tablet Take 2 tablets (1,200 mg total) by mouth 2 (two) times daily as needed. Patient taking differently: Take 600 mg by mouth 2 (two) times daily as needed. For seven days 12/15/16  Yes Debbe Odea, MD  insulin glargine (LANTUS) 100 UNIT/ML injection Inject 0.4 mLs (40 Units total) into the skin 2 (two) times daily. 12/15/16  Yes Debbe Odea, MD  metoprolol tartrate (LOPRESSOR) 25 MG tablet Take 1 tablet (25 mg total) by mouth 2 (two) times daily. 03/17/15  Yes Kelvin Cellar, MD  pantoprazole (PROTONIX) 40 MG tablet Take 40 mg by mouth daily.   Yes Historical Provider, MD  potassium chloride SA (K-DUR,KLOR-CON) 20 MEQ tablet Take 20 mEq by mouth 2  (two) times daily.    Yes Historical Provider, MD  sennosides-docusate sodium (SENOKOT-S) 8.6-50 MG tablet Take 1 tablet by mouth daily as needed.    Yes Historical Provider, MD  simvastatin (ZOCOR) 20 MG tablet Take 20 mg by mouth daily.   Yes Historical Provider, MD  terazosin (HYTRIN) 2 MG capsule Take 4 mg by mouth daily. Take two 2-mg tablets to = 4 mg   Yes Historical Provider, MD  terbinafine (LAMISIL) 250 MG tablet Take 250 mg by mouth daily. continous 11/08/16  Yes Historical Provider, MD  traMADol (ULTRAM) 50 MG tablet Take 50 mg by mouth every 6 (six) hours as needed for moderate pain.   Yes Historical Provider, MD  vitamin B-12 (CYANOCOBALAMIN) 1000 MCG tablet Take 1,000 mcg by mouth daily.   Yes Historical Provider, MD    Medications:  Scheduled: . aspirin  300 mg Rectal Daily   Or  . aspirin  325 mg Oral Daily  . Chlorhexidine Gluconate Cloth  6 each Topical Q0600  . clopidogrel  75 mg Oral Daily  . fluticasone  1 spray Each Nare Daily  . furosemide  40 mg Oral q1800  . furosemide  80 mg Oral Q breakfast  . insulin aspart  0-9 Units Subcutaneous TID WC  . ketorolac  15 mg Intravenous Once  . pantoprazole  40 mg Oral Daily  . simvastatin  20 mg Oral QHS  . sodium chloride  500 mL Intravenous Once  . vancomycin  125 mg Oral QID  . vitamin B-12  1,000 mcg Oral Daily    Results for orders placed or performed during the hospital encounter of 01/01/17 (from the past 48 hour(s))  Glucose, capillary     Status: Abnormal   Collection Time: 01/05/17  4:25 PM  Result Value Ref Range   Glucose-Capillary 148 (H) 65 - 99 mg/dL   Comment 1 Notify RN    Comment 2 Document in Chart   Glucose, capillary     Status: Abnormal   Collection Time: 01/05/17  7:52 PM  Result Value Ref Range   Glucose-Capillary 161 (H) 65 - 99 mg/dL   Comment 1 Notify RN   Urinalysis, Routine w reflex microscopic     Status: Abnormal   Collection Time: 01/05/17  8:24 PM  Result Value Ref Range   Color,  Urine ORANGE (A) YELLOW    Comment: BIOCHEMICALS MAY BE AFFECTED BY COLOR   APPearance CLOUDY (A) CLEAR   Specific Gravity, Urine 1.010 1.005 - 1.030   pH 7.0 5.0 - 8.0   Glucose, UA NEGATIVE NEGATIVE mg/dL   Hgb urine dipstick LARGE (A) NEGATIVE   Bilirubin Urine NEGATIVE NEGATIVE   Ketones, ur 40 (A) NEGATIVE mg/dL  Protein, ur 30 (A) NEGATIVE mg/dL   Nitrite NEGATIVE NEGATIVE   Leukocytes, UA TRACE (A) NEGATIVE  Urinalysis, Microscopic (reflex)     Status: None   Collection Time: 01/05/17  8:24 PM  Result Value Ref Range   RBC / HPF TOO NUMEROUS TO COUNT 0 - 5 RBC/hpf   WBC, UA 0-5 0 - 5 WBC/hpf   Bacteria, UA NONE SEEN NONE SEEN   Squamous Epithelial / LPF NONE SEEN NONE SEEN  Glucose, capillary     Status: Abnormal   Collection Time: 01/06/17 12:20 AM  Result Value Ref Range   Glucose-Capillary 163 (H) 65 - 99 mg/dL   Comment 1 Notify RN   CBC     Status: Abnormal   Collection Time: 01/06/17  3:29 AM  Result Value Ref Range   WBC 13.1 (H) 4.0 - 10.5 K/uL   RBC 6.05 (H) 4.22 - 5.81 MIL/uL   Hemoglobin 13.0 13.0 - 17.0 g/dL   HCT 40.9 39.0 - 52.0 %   MCV 67.6 (L) 78.0 - 100.0 fL   MCH 21.5 (L) 26.0 - 34.0 pg   MCHC 31.8 30.0 - 36.0 g/dL   RDW 15.8 (H) 11.5 - 15.5 %   Platelets 137 (L) 150 - 400 K/uL  Basic metabolic panel     Status: Abnormal   Collection Time: 01/06/17  3:29 AM  Result Value Ref Range   Sodium 140 135 - 145 mmol/L   Potassium 4.0 3.5 - 5.1 mmol/L   Chloride 99 (L) 101 - 111 mmol/L   CO2 26 22 - 32 mmol/L   Glucose, Bld 164 (H) 65 - 99 mg/dL   BUN 11 6 - 20 mg/dL   Creatinine, Ser 1.10 0.61 - 1.24 mg/dL   Calcium 9.6 8.9 - 10.3 mg/dL   GFR calc non Af Amer >60 >60 mL/min   GFR calc Af Amer >60 >60 mL/min    Comment: (NOTE) The eGFR has been calculated using the CKD EPI equation. This calculation has not been validated in all clinical situations. eGFR's persistently <60 mL/min signify possible Chronic Kidney Disease.    Anion gap 15 5 - 15   Glucose, capillary     Status: Abnormal   Collection Time: 01/06/17  4:06 AM  Result Value Ref Range   Glucose-Capillary 141 (H) 65 - 99 mg/dL   Comment 1 Notify RN   Glucose, capillary     Status: Abnormal   Collection Time: 01/06/17  8:11 AM  Result Value Ref Range   Glucose-Capillary 175 (H) 65 - 99 mg/dL  Glucose, capillary     Status: Abnormal   Collection Time: 01/06/17 12:42 PM  Result Value Ref Range   Glucose-Capillary 178 (H) 65 - 99 mg/dL  Glucose, capillary     Status: Abnormal   Collection Time: 01/06/17  4:25 PM  Result Value Ref Range   Glucose-Capillary 156 (H) 65 - 99 mg/dL  Glucose, capillary     Status: Abnormal   Collection Time: 01/06/17  7:33 PM  Result Value Ref Range   Glucose-Capillary 206 (H) 65 - 99 mg/dL   Comment 1 Notify RN   Glucose, capillary     Status: Abnormal   Collection Time: 01/06/17 11:08 PM  Result Value Ref Range   Glucose-Capillary 168 (H) 65 - 99 mg/dL   Comment 1 Notify RN   Glucose, capillary     Status: Abnormal   Collection Time: 01/07/17  2:50 AM  Result Value Ref Range  Glucose-Capillary 188 (H) 65 - 99 mg/dL   Comment 1 Notify RN   Glucose, capillary     Status: Abnormal   Collection Time: 01/07/17  7:53 AM  Result Value Ref Range   Glucose-Capillary 178 (H) 65 - 99 mg/dL  CBC     Status: Abnormal   Collection Time: 01/07/17  8:28 AM  Result Value Ref Range   WBC 12.2 (H) 4.0 - 10.5 K/uL   RBC 5.89 (H) 4.22 - 5.81 MIL/uL   Hemoglobin 12.5 (L) 13.0 - 17.0 g/dL   HCT 39.9 39.0 - 52.0 %   MCV 67.7 (L) 78.0 - 100.0 fL   MCH 21.2 (L) 26.0 - 34.0 pg   MCHC 31.3 30.0 - 36.0 g/dL   RDW 15.7 (H) 11.5 - 15.5 %   Platelets 142 (L) 150 - 400 K/uL  Basic metabolic panel     Status: Abnormal   Collection Time: 01/07/17  8:28 AM  Result Value Ref Range   Sodium 139 135 - 145 mmol/L   Potassium 3.5 3.5 - 5.1 mmol/L   Chloride 96 (L) 101 - 111 mmol/L   CO2 30 22 - 32 mmol/L   Glucose, Bld 193 (H) 65 - 99 mg/dL   BUN 16 6 -  20 mg/dL   Creatinine, Ser 1.24 0.61 - 1.24 mg/dL   Calcium 9.2 8.9 - 10.3 mg/dL   GFR calc non Af Amer 52 (L) >60 mL/min   GFR calc Af Amer >60 >60 mL/min    Comment: (NOTE) The eGFR has been calculated using the CKD EPI equation. This calculation has not been validated in all clinical situations. eGFR's persistently <60 mL/min signify possible Chronic Kidney Disease.    Anion gap 13 5 - 15  Blood gas, arterial     Status: Abnormal   Collection Time: 01/07/17  9:15 AM  Result Value Ref Range   FIO2 21.00    Delivery systems ROOM AIR    pH, Arterial 7.428 7.350 - 7.450   pCO2 arterial 47.3 32.0 - 48.0 mmHg   pO2, Arterial 69.7 (L) 83.0 - 108.0 mmHg   Bicarbonate 30.7 (H) 20.0 - 28.0 mmol/L   Acid-Base Excess 6.3 (H) 0.0 - 2.0 mmol/L   O2 Saturation 93.4 %   Patient temperature 98.6    Collection site RIGHT RADIAL    Drawn by [phone]    Sample type ARTERIAL DRAW    Allens test (pass/fail) PASS PASS  Glucose, capillary     Status: Abnormal   Collection Time: 01/07/17 11:48 AM  Result Value Ref Range   Glucose-Capillary 191 (H) 65 - 99 mg/dL    Dg Abd Portable 1v  Result Date: 01/07/2017 CLINICAL DATA:  Abdominal pain EXAM: PORTABLE ABDOMEN - 1 VIEW COMPARISON:  CT abdomen and pelvis Apr 04, 2013 FINDINGS: There is no bowel dilatation or air-fluid level suggesting bowel obstruction. No free air. There is aortic atherosclerosis. IMPRESSION: No bowel obstruction or free air evident.  Aortic atherosclerosis. Electronically Signed   By: Lowella Grip III M.D.   On: 01/07/2017 13:23    ROS Blood pressure 126/63, pulse (!) 104, temperature 98.3 F (36.8 C), temperature source Oral, resp. rate (!) 25, height _0  (1.803 m), weight 118.5 kg (261 lb 4.8 oz), SpO2 95 %. Neurologic Examination:  Awakens to verbal stimuli appropriately.   Fully oriented to year, month, date, hospital, city, but not day of the week. Good eye contact.  EOMI.  PERL. Face symmetrical. Tongue midline.  Strength 5/5 bil UE and LE. Coord-intact.  Areas of skin erythema in the legs improved from prior margins, but still prominent.    Assessment/Plan:  The patient is not really confused or have mental status changes.  It is easy to lose track of the day of the week if you are in and out of the hospital.   He is a bit sleepy and lethargic, but he 2 infections in his body, namely the C. Difficile and Cellulitis so it is understandable that he does not feel well.  The MRI is essentially normal and further brain imaging is not necessary.  I expect him to improve slowly as his infectious processes dissipate.    Jared Jury, MD 01/07/2017, 3:19 PM

## 2017-01-07 NOTE — Progress Notes (Addendum)
PROGRESS NOTE    Jared Hall  PYP:[id number] DOB: 08/24/1931 DOA: 01/01/2017 PCP: Henrine Screws, MD   Brief Narrative: Jared Hall is a 83 y.o. male with medical history significant of hypertension, hyperlipidemia, chronic kidney disease stage III, prior CVA, CHF with last EF of 55% April 2016, diabetes mellitus, hyperlipidemia who was recently hospitalized from 12/05/2016- 12/15/2016 for bilateral lower extremity cellulitis and edema, acute hypoxic respiratory failure, who presents to the ED from nursing facility at [facility] with a four-day history of slurred speech, progressive confusion. Patient with slurred dysarthric speech with an expressive aphasia and a such history obtained from ED PA and from the chart. Per chart in ED PA it was noted that patient had had a four-day history of altered mental status with slurred speech and also noted to have a left facial droop. ED RN spoke with staff at the nursing home it was noticed that patient began to be more confused on 12/29/2016. The morning of admission patient was noted to be more confused and had multiple falls and only oriented to self. Patient subsequently sent to the ED.   Assessment & Plan:   Principal Problem:   CVA (cerebral vascular accident) ([place]) Active Problems:   History of CVA (cerebrovascular accident)   Dyslipidemia   AKI (acute kidney injury) ([place])   Essential hypertension, benign   Type 1 diabetes mellitus with peripheral circulatory complications ([facility])   Depression   GERD (gastroesophageal reflux disease)   Diabetic neuropathy ([facility])   Venous stasis dermatitis   DM type 2 with diabetic peripheral neuropathy ([facility])   Paroxysmal atrial fibrillation ([facility])   Chronic kidney disease, stage III (moderate)   Morbid obesity ([facility])   Dehydration   Anemia   Altered mental status   Acute respiratory failure with hypercapnia ([place])   1-Acute encephalopathy;  This could be multifactorial, secondary to medications, Vs  stroke vs hypercapnia.  MRI showed stroke. Neurology consulted. Neurology doesn't think that MRI finding explain patient mental status.  Holding sedative; gabapentin, amitryptiline, bupropion.  EEG ordered. Unable to be perform, patient not cooperative with test  Ammonia normal at 19.  UA negative, Chest x ray ; atelectasis vs infiltrates lingula.  blood culture no growth to date.  Less interactive this morning; Check ABG, BIPAP as needed. Check B-Met  Check CT head. Will consult neurology again.  Per daughter in law patient has fallen twice at SNF   2-Acute on chronic hypercapnic respiratory failure.  Transfer t step down unit.  CCM consulted. BIPAP ordered. Patient refuse BIPAP/  Continue with lasix. lasix.   3-C Diff colitis.  Patient with multiples BM overnight. He report abdominal pain with BM. Leukocytosis this morning.  I have ask nurse to send C diff sample with next BM.  C diff came back positive. Will start vancomycin.  Discontinue cefepime.  No diarrhea, but complaints of abdominal pain. Will check KUB.    4-HTN; On  lasix.   5-LE cellulitis; improved.  Discontinue cefepime due to positive c diff.  discontinue doxy.  Monitor off Antibiotics. Follow WBC.   6-DM; SSI. Hold lantus.  Hypoglycemia; resolved/   Right parietal white matter sub acute stroke;  LDL 76. On aspirin and plavix.  HB;A1c; 8.6 Carotid doppler; limited, no significant stenosis.  Was on eliquis in the past, stopped due to increase risk for fall. Develops hematuria this admission.   Acute kidney injury on chronic kidney disease stage III Cr peak to 1.7. Improved with IV fluids.  Cr  has decreased to 1.0. Await labs for the morning.   history of PSVT/paroxysmal A. fib Patient currently rate controlled. Patient used to be on eliquis which was reportedly discontinued and patient on Plavix.  Hematuria; resolved.    DVT prophylaxis: resume Lovenox Code Status: DNR  Family Communication: will  contact family, to updates regarding changes. Will discussed with family about palliative care consult.  Disposition Plan: remain in the step down.    Consultants:   CCM  Neurology    Procedures:   ECHO; normal ef  Doppler. 1-39 %    Antimicrobials:   Doxy   Subjective: Patient didn't have good day yesterday. He didn't eat. He has been more sleepy and less interactive.  This morning , he answer questions, slow respond time, falls back to sleep. He is not feeling well, doesn't know why.  He does not have an appetite.     Objective: Vitals:   01/06/17 1912 01/06/17 2310 01/07/17 0252 01/07/17 0751  BP: 117/60 (!) 154/81 (!) 143/75 (!) 154/66  Pulse: 100 96 97 (!) 44  Resp: (!) 26 (!) 22 (!) 24 (!) 22  Temp: 97.6 F (36.4 C) 97.7 F (36.5 C) 97.9 F (36.6 C) 97.7 F (36.5 C)  TempSrc: Oral Axillary Axillary Oral  SpO2: 100% 97% 94% 97%  Weight:      Height:        Intake/Output Summary (Last 24 hours) at 01/07/17 0826 Last data filed at 01/06/17 1700  Gross per 24 hour  Intake              477 ml  Output                0 ml  Net              477 ml   Filed Weights   01/01/17 1422 01/01/17 2030  Weight: 119.7 kg (264 lb) 118.5 kg (261 lb 4.8 oz)    Examination:  General exam: Appears calm., less interacative, lethargic.  Respiratory system: bilateral ronchus . Respiratory effort normal. Cardiovascular system: S1 & S2 heard, RRR. No JVD, murmurs, rubs, gallops or clicks. No pedal edema. Gastrointestinal system: Abdomen is distended, soft and mild tender. No organomegaly or masses felt. Normal bowel sounds heard. Central nervous system: Alert and oriented. No focal neurological deficits. Extremities: trace edema., Skin: bilateral lower extremity with erythema.     Data Reviewed: I have personally reviewed following labs and imaging studies  CBC:  Recent Labs Lab 01/01/17 1507 01/02/17 0237 01/03/17 0157 01/04/17 0450 01/05/17 0917  01/06/17 0329  WBC 9.5 8.3 10.3 12.5* 11.5* 13.1*  NEUTROABS 6.7  --   --   --   --   --   HGB 10.6* 10.3* 11.1* 11.3* 12.1* 13.0  HCT 35.6* 34.9* 37.3* 37.0* 39.2 40.9  MCV 69.4* 70.2* 70.2* 69.4* 67.7* 67.6*  PLT 164 151 136* 135* 149* 956*   Basic Metabolic Panel:  Recent Labs Lab 01/02/17 0237 01/03/17 0157 01/04/17 0450 01/05/17 0917 01/06/17 0329  NA 143 141 139 138 140  K 3.5 3.6 4.6 3.5 4.0  CL 98* 98* 101 101 99*  CO2 35* '31 31 24 26  '$ GLUCOSE 84 144* 166* 184* 164*  BUN '15 10 9 10 11  '$ CREATININE 1.45* 1.08 1.03 0.96 1.10  CALCIUM 8.8* 8.7* 8.5* 9.2 9.6   GFR: Estimated Creatinine Clearance: 66.6 mL/min (by C-G formula based on SCr of 1.1 mg/dL). Liver Function Tests:  Recent Labs Lab 01/01/17  1507  AST 24  ALT 19  ALKPHOS 68  BILITOT 0.7  PROT 5.9*  ALBUMIN 2.9*   No results for input(s): LIPASE, AMYLASE in the last 168 hours.  Recent Labs Lab 01/01/17 1507  AMMONIA 19   Coagulation Profile: No results for input(s): INR, PROTIME in the last 168 hours. Cardiac Enzymes: No results for input(s): CKTOTAL, CKMB, CKMBINDEX, TROPONINI in the last 168 hours. BNP (last 3 results) No results for input(s): PROBNP in the last 8760 hours. HbA1C: No results for input(s): HGBA1C in the last 72 hours. CBG:  Recent Labs Lab 01/06/17 1625 01/06/17 1933 01/06/17 2308 01/07/17 0250 01/07/17 0753  GLUCAP 156* 206* 168* 188* 178*   Lipid Profile: No results for input(s): CHOL, HDL, LDLCALC, TRIG, CHOLHDL, LDLDIRECT in the last 72 hours. Thyroid Function Tests: No results for input(s): TSH, T4TOTAL, FREET4, T3FREE, THYROIDAB in the last 72 hours. Anemia Panel: No results for input(s): VITAMINB12, FOLATE, FERRITIN, TIBC, IRON, RETICCTPCT in the last 72 hours. Sepsis Labs:  Recent Labs Lab 01/01/17 1650  LATICACIDVEN 0.88    Recent Results (from the past 240 hour(s))  Urine culture     Status: None   Collection Time: 01/01/17  5:23 PM  Result Value  Ref Range Status   Specimen Description URINE, RANDOM  Final   Special Requests ADDED [id number] [phone]  Final   Culture NO GROWTH  Final   Report Status 01/03/2017 FINAL  Final  MRSA PCR Screening     Status: Abnormal   Collection Time: 01/02/17  2:42 PM  Result Value Ref Range Status   MRSA by PCR POSITIVE (A) NEGATIVE Final    Comment:        The GeneXpert MRSA Assay (FDA approved for NASAL specimens only), is one component of a comprehensive MRSA colonization surveillance program. It is not intended to diagnose MRSA infection nor to guide or monitor treatment for MRSA infections. RESULT CALLED TO, READ BACK BY AND VERIFIED WITH: D.EVERETTE,RN AT 1932 BY L.PITT 01/02/17   Culture, blood (routine x 2)     Status: None (Preliminary result)   Collection Time: 01/03/17  8:35 AM  Result Value Ref Range Status   Specimen Description BLOOD LEFT ARM  Final   Special Requests BOTTLES DRAWN AEROBIC AND ANAEROBIC 5CC EA  Final   Culture NO GROWTH 3 DAYS  Final   Report Status PENDING  Incomplete  Culture, blood (routine x 2)     Status: None (Preliminary result)   Collection Time: 01/03/17  8:38 AM  Result Value Ref Range Status   Specimen Description BLOOD LEFT ARM  Final   Special Requests IN PEDIATRIC BOTTLE 2CC  Final   Culture NO GROWTH 3 DAYS  Final   Report Status PENDING  Incomplete  C difficile quick scan w PCR reflex     Status: Abnormal   Collection Time: 01/04/17 11:20 AM  Result Value Ref Range Status   C Diff antigen POSITIVE (A) NEGATIVE Final   C Diff toxin POSITIVE (A) NEGATIVE Final   C Diff interpretation Toxin producing C. difficile detected.  Final    Comment: CRITICAL RESULT CALLED TO, READ BACK BY AND VERIFIED WITH: J.COVINGTON RN AT 1249 01/04/17 BY A.DAVIS          Radiology Studies: No results found.      Scheduled Meds: . aspirin  300 mg Rectal Daily   Or  . aspirin  325 mg Oral Daily  . Chlorhexidine Gluconate Cloth  6 each Topical  [id number]  .  clopidogrel  75 mg Oral Daily  . fluticasone  1 spray Each Nare Daily  . furosemide  40 mg Oral q1800  . furosemide  80 mg Oral Q breakfast  . insulin aspart  0-9 Units Subcutaneous TID WC  . ketorolac  15 mg Intravenous Once  . mupirocin ointment  1 application Nasal BID  . pantoprazole  40 mg Oral Daily  . simvastatin  20 mg Oral QHS  . sodium chloride  500 mL Intravenous Once  . vancomycin  125 mg Oral QID  . vitamin B-12  1,000 mcg Oral Daily   Continuous Infusions:    LOS: 6 days    Time spent: 35 minutes.     Elmarie Shiley, MD [facility] Pager [phone]  If 7PM-7AM, please contact night-coverage [email or web address] Password [facility] 01/07/2017, 8:26 AM

## 2017-01-07 NOTE — Plan of Care (Signed)
Problem: Nutrition: Goal: Dietary intake will improve Outcome: Not Progressing Pt states he is not hungry and refuses to eat very much.

## 2017-01-07 NOTE — Progress Notes (Signed)
Pt very sleepy but will wake to voice. Pt is oriented and able to answer questions appropriately. MD aware. Consuelo Pandy RN

## 2017-01-07 NOTE — Plan of Care (Signed)
Problem: Nutrition: Goal: Risk of aspiration will decrease Outcome: Progressing Pt able to eat and drink without coughing on  current diet, dysphagia 3/ nectar thick. Pt doing well with whole meds in applesauce. Pt states he is not very hungry and not eating much

## 2017-01-08 ENCOUNTER — Inpatient Hospital Stay (HOSPITAL_COMMUNITY)
Admit: 2017-01-08 | Discharge: 2017-01-08 | Disposition: A | Discharge status: Home / Self Care | Payer: Medicare Other | Attending: Internal Medicine | Admitting: Internal Medicine

## 2017-01-08 DIAGNOSIS — I679 Cerebrovascular disease, unspecified: Secondary | ICD-10-CM

## 2017-01-08 DIAGNOSIS — G9340 Encephalopathy, unspecified: Secondary | ICD-10-CM

## 2017-01-08 DIAGNOSIS — I639 Cerebral infarction, unspecified: Secondary | ICD-10-CM

## 2017-01-08 DIAGNOSIS — R531 Weakness: Secondary | ICD-10-CM

## 2017-01-08 LAB — CBC
HEMATOCRIT: 39.9 % (ref 39.0–52.0)
Hemoglobin: 12.5 g/dL — ABNORMAL LOW (ref 13.0–17.0)
MCH: 21.1 pg — AB (ref 26.0–34.0)
MCHC: 31.3 g/dL (ref 30.0–36.0)
MCV: 67.4 fL — AB (ref 78.0–100.0)
Platelets: 190 10*3/uL (ref 150–400)
RBC: 5.92 MIL/uL — AB (ref 4.22–5.81)
RDW: 15.5 % (ref 11.5–15.5)
WBC: 12.2 10*3/uL — AB (ref 4.0–10.5)

## 2017-01-08 LAB — GLUCOSE, CAPILLARY
GLUCOSE-CAPILLARY: 208 mg/dL — AB (ref 65–99)
Glucose-Capillary: 273 mg/dL — ABNORMAL HIGH (ref 65–99)
Glucose-Capillary: 258 mg/dL — ABNORMAL HIGH (ref 65–99)
Glucose-Capillary: 307 mg/dL — ABNORMAL HIGH (ref 65–99)
Glucose-Capillary: 230 mg/dL — ABNORMAL HIGH (ref 65–99)
Glucose-Capillary: 218 mg/dL — ABNORMAL HIGH (ref 65–99)

## 2017-01-08 LAB — BASIC METABOLIC PANEL
ANION GAP: 12 (ref 5–15)
BUN: 17 mg/dL (ref 6–20)
CHLORIDE: 97 mmol/L — AB (ref 101–111)
CO2: 31 mmol/L (ref 22–32)
Calcium: 9.5 mg/dL (ref 8.9–10.3)
Creatinine, Ser: 1.2 mg/dL (ref 0.61–1.24)
GFR calc Af Amer: >60 mL/min (ref >60)
GFR calc non Af Amer: 54 mL/min — ABNORMAL LOW (ref >60)
GLUCOSE: 324 mg/dL — AB (ref 65–99)
POTASSIUM: 3.4 mmol/L — AB (ref 3.5–5.1)
Sodium: 140 mmol/L (ref 135–145)

## 2017-01-08 LAB — CULTURE, BLOOD (ROUTINE X 2)
Culture: NO GROWTH
Culture: NO GROWTH

## 2017-01-08 LAB — POTASSIUM: POTASSIUM: 3.6 mmol/L (ref 3.5–5.1)

## 2017-01-08 LAB — MAGNESIUM: MAGNESIUM: 1.9 mg/dL (ref 1.7–2.4)

## 2017-01-08 MED ORDER — BACITRACIN-NEOMYCIN-POLYMYXIN OINTMENT TUBE
TOPICAL_OINTMENT | Freq: Three times a day (TID) | CUTANEOUS | Status: DC
  Administered 2017-01-08 – 2017-01-09 (×3): via TOPICAL
  Filled 2017-01-08 (×2): qty 14.17

## 2017-01-08 MED ORDER — FUROSEMIDE 40 MG PO TABS
40.0000 mg | ORAL_TABLET | Freq: Every day | ORAL | Status: DC
  Administered 2017-01-08 – 2017-01-09 (×2): 40 mg via ORAL
  Filled 2017-01-08 (×2): qty 1

## 2017-01-08 MED ORDER — METOPROLOL TARTRATE 25 MG PO TABS
25.0000 mg | ORAL_TABLET | Freq: Two times a day (BID) | ORAL | Status: DC
  Administered 2017-01-08 – 2017-01-09 (×3): 25 mg via ORAL
  Filled 2017-01-08 (×3): qty 1

## 2017-01-08 MED ORDER — POTASSIUM CHLORIDE CRYS ER 20 MEQ PO TBCR
40.0000 meq | EXTENDED_RELEASE_TABLET | Freq: Once | ORAL | Status: AC
  Administered 2017-01-08: 40 meq via ORAL
  Filled 2017-01-08: qty 2

## 2017-01-08 MED ORDER — HYDROCERIN EX CREA
TOPICAL_CREAM | Freq: Every day | CUTANEOUS | Status: DC
  Administered 2017-01-08 – 2017-01-09 (×2): via TOPICAL
  Filled 2017-01-08: qty 113

## 2017-01-08 MED ORDER — AMITRIPTYLINE HCL 25 MG PO TABS
25.0000 mg | ORAL_TABLET | Freq: Every day | ORAL | Status: DC
Start: 2017-01-08 — End: 2017-01-10
  Administered 2017-01-08: 25 mg via ORAL
  Filled 2017-01-08: qty 1

## 2017-01-08 MED ORDER — INSULIN GLARGINE 100 UNIT/ML ~~LOC~~ SOLN
10.0000 [IU] | Freq: Every day | SUBCUTANEOUS | Status: DC
  Administered 2017-01-08: 10 [IU] via SUBCUTANEOUS
  Filled 2017-01-08 (×2): qty 0.1

## 2017-01-08 NOTE — Progress Notes (Signed)
Routine EEG completed, results pending. 

## 2017-01-08 NOTE — Procedures (Signed)
HPI:  83 y/o with MS change  TECHNICAL SUMMARY:  A multichannel referential and bipolar montage EEG using the standard international 10-20 system was performed on the patient described as confused.  The dominant background activity consists of 6-7 hertz activity seen most prominantly over the posterior head region.  Much 5-6 Hz activity can be seen intermixed in all head regions.    Low voltage fast (beta) activity is distributed symmetrically and maximally over the anterior head regions.  ACTIVATION:  Stepwise photic stimulation and hyperventilation are not performed  EPILEPTIFORM ACTIVITY:  There were no spikes, sharp waves or paroxysmal activity.  SLEEP:  Physiologic drowsiness is noted, but no stage II sleep.  CARDIAC:  The EKG lead is not well recorded  IMPRESSION:  This is an abnormal EEG demonstrating a moderate diffuse slowing of electrocerebral activity.  This can be seen in a wide variety of encephalopathic state including those of a toxic, metabolic, or degenerative nature.  There were no focal, hemispheric, or lateralizing features.  No epileptiform activity was recorded.

## 2017-01-08 NOTE — Progress Notes (Signed)
Speech Language Pathology Treatment: Dysphagia  Patient Details Name: Jared Hall MRN: [id number] DOB: 1931/09/24 Today's Date: 01/08/2017 Time: [id number] SLP Time Calculation (min) (ACUTE ONLY): 39 min  Assessment / Plan / Recommendation Clinical Impression  Pt reportedly coughing with staff when drinking over the weekend. Pt seen with am meal, but refused all solids except for one bite of potatoes. He asked for an ensure and reported ongoing stomach pain to MD. Pt did have coughing with orange juice initially when he drank 3 oz consecutively. SLP then gave two verbal cues for small sips and observed pt independently drinking water and ensure as he drifted in and out of napping. Pt followed precautions of small sips with 100% accuracy and had no further coughing. Recommend pt continue thin liquids to maximize intake of the one thing he will accept, liquids. As long as pt has reminders to drink one sip at a time he seems to tolerate well. Will follow.    HPI HPI: 82 y.o.malewith medical history significant of hypertension, hyperlipidemia, chronic kidney disease stage III, prior CVA, CHF with last EF of 55% April 2016, diabetes mellitus, hyperlipidemia who was recently hospitalized from 12/05/2016- 12/15/2016 for bilateral lower extremity cellulitis and edema, acute hypoxic respiratory failure, who presents to the ED from nursing facility at [facility] with a four-day history of slurred speech, progressive confusion. MRI was limited but suggestive of small acute/early subacute small vessel ischemic infarct in the right parietal white matter. Previous MBS 03/16/15 recommended Dys 2 diet and nectar thick liquids due to silent aspiration of thin liquids. Pt admitted wtih Bell's Palsy at that time with incidental finding of left frontal CVA.      SLP Plan  Continue with current plan of care     Recommendations  Diet recommendations: Dysphagia 3 (mechanical soft);Thin liquid Liquids provided via:  Cup;No straw Medication Administration: Whole meds with puree Supervision: Intermittent supervision to cue for compensatory strategies;Patient able to self feed Compensations: Slow rate;Small sips/bites Postural Changes and/or Swallow Maneuvers: Seated upright 90 degrees;Out of bed for meals                Oral Care Recommendations: Oral care BID Follow up Recommendations: Skilled Nursing facility Plan: Continue with current plan of care       [name], MA CCC-SLP [id number]  Lynann Beaver 01/08/2017, 9:41 AM

## 2017-01-08 NOTE — Progress Notes (Signed)
Pt arrived to 5M04, alert, no complaints of pain.  Telemetry applied and CCMD notified.  VSS.  Will continue to monitor.  Cori Razor, RN

## 2017-01-08 NOTE — Progress Notes (Signed)
Subjective: Reports he is not hungry this morning. Reports abdominal pain and loose stools getting better. Answers all questions appropriately and oriented to person, place, and month/year. Did not know day of week (thought it was Sunday).   Objective: Current vital signs: BP (!) 149/76 (BP Location: Right Arm)   Pulse 100   Temp 97.9 F (36.6 C) (Oral)   Resp (!) 26   Ht 5\' 11"  (1.803 m)   Wt 261 lb 4.8 oz (118.5 kg)   SpO2 97%   BMI 36.44 kg/m  Vital signs in last 24 hours: Temp:  [97.9 F (36.6 C)-99.2 F (37.3 C)] 97.9 F (36.6 C) (02/19 0828) Pulse Rate:  [100-114] 100 (02/19 0828) Resp:  [21-27] 26 (02/19 0828) BP: (114-149)/(63-90) 149/76 (02/19 0828) SpO2:  [92 %-97 %] 97 % (02/19 0828)  Intake/Output from previous day: 02/18 0701 - 02/19 0700 In: 300 [P.O.:300] Out: 650 [Urine:650] Intake/Output this shift: No intake/output data recorded. Nutritional status: DIET DYS 3 Room service appropriate? Yes; Fluid consistency: Thin  ROS:                                                                                                                                       History obtained from the patient  General ROS: negative for - chills, fatigue, fever, night sweats, weight gain or weight loss Psychological ROS: negative for - behavioral disorder, hallucinations, memory difficulties, mood swings or suicidal ideation Ophthalmic ROS: negative for - blurry vision, double vision, eye pain or loss of vision ENT ROS: negative for - epistaxis, nasal discharge, oral lesions, sore throat, tinnitus or vertigo Allergy and Immunology ROS: negative for - hives or itchy/watery eyes Hematological and Lymphatic ROS: negative for - bleeding problems, bruising or swollen lymph nodes Endocrine ROS: negative for - galactorrhea, hair pattern changes, polydipsia/polyuria or temperature intolerance Respiratory ROS: negative for - cough, hemoptysis, shortness of breath or wheezing Cardiovascular  ROS: negative for - chest pain, dyspnea on exertion, edema or irregular heartbeat Gastrointestinal ROS: negative for - hematemesis, nausea/vomiting or stool incontinence Genito-Urinary ROS: negative for - dysuria, hematuria, incontinence or urinary frequency/urgency Musculoskeletal ROS: negative for - joint swelling or muscular weakness Neurological ROS: as noted in HPI Dermatological ROS: negative for rash and skin lesion changes    Neurologic Exam: General: NAD Mental Status: Alert and oriented to person, [facility], and month/year. Did not know day of week. Thought content appropriate.  Speech fluent without evidence of aphasia.  Cranial Nerves: II:  Visual fields grossly normal, PERRL III,IV, VI: ptosis not present, extra-ocular motions intact bilaterally V,VII: smile symmetric, facial light touch sensation normal bilaterally VIII: Decreased hearing bilaterally- chronic  IX,X: uvula rises symmetrically XI: bilateral shoulder shrug XII: midline tongue extension without atrophy or fasciculations  Motor: Right : Upper extremity   5/5    Left:     Upper extremity   5/5  Lower extremity  5/5     Lower extremity   5/5 Tone and bulk:normal tone throughout; no atrophy noted Sensory: Light touch intact throughout, bilaterally Deep Tendon Reflexes: 2+ throughout Plantars: Right: downgoing   Left: downgoing Cerebellar: normal finger-to-nose Gait: Unable to assess   Lab Results: Basic Metabolic Panel:  Recent Labs Lab 01/03/17 0157 01/04/17 0450 01/05/17 0917 01/06/17 0329 01/07/17 0828  NA 141 139 138 140 139  K 3.6 4.6 3.5 4.0 3.5  CL 98* 101 101 99* 96*  CO2 31 31 24 26 30   GLUCOSE 144* 166* 184* 164* 193*  BUN 10 9 10 11 16   CREATININE 1.08 1.03 0.96 1.10 1.24  CALCIUM 8.7* 8.5* 9.2 9.6 9.2    Liver Function Tests:  Recent Labs Lab 01/01/17 1507  AST 24  ALT 19  ALKPHOS 68  BILITOT 0.7  PROT 5.9*  ALBUMIN 2.9*    Recent Labs Lab 01/01/17 1507   AMMONIA 19    CBC:  Recent Labs Lab 01/01/17 1507  01/03/17 0157 01/04/17 0450 01/05/17 0917 01/06/17 0329 01/07/17 0828  WBC 9.5  < > 10.3 12.5* 11.5* 13.1* 12.2*  NEUTROABS 6.7  --   --   --   --   --   --   HGB 10.6*  < > 11.1* 11.3* 12.1* 13.0 12.5*  HCT 35.6*  < > 37.3* 37.0* 39.2 40.9 39.9  MCV 69.4*  < > 70.2* 69.4* 67.7* 67.6* 67.7*  PLT 164  < > 136* 135* 149* 137* 142*  < > = values in this interval not displayed.   Lipid Panel:  Recent Labs Lab 01/02/17 0237  CHOL 130  TRIG 130  HDL 28*  CHOLHDL 4.6  VLDL 26  LDLCALC 76    CBG:  Recent Labs Lab 01/07/17 1637 01/07/17 1931 01/07/17 2244 01/08/17 0257 01/08/17 0827  GLUCAP 275* 220* 214* 70* 32*    Microbiology: Results for orders placed or performed during the hospital encounter of 01/01/17  Urine culture     Status: None   Collection Time: 01/01/17  5:23 PM  Result Value Ref Range Status   Specimen Description URINE, RANDOM  Final   Special Requests ADDED [id number]  Final   Culture NO GROWTH  Final   Report Status 01/03/2017 FINAL  Final  MRSA PCR Screening     Status: Abnormal   Collection Time: 01/02/17  2:42 PM  Result Value Ref Range Status   MRSA by PCR POSITIVE (A) NEGATIVE Final    Comment:        The GeneXpert MRSA Assay (FDA approved for NASAL specimens only), is one component of a comprehensive MRSA colonization surveillance program. It is not intended to diagnose MRSA infection nor to guide or monitor treatment for MRSA infections. RESULT CALLED TO, READ BACK BY AND VERIFIED WITH: D.EVERETTE,RN AT 1932 BY L.PITT 01/02/17   Culture, blood (routine x 2)     Status: None (Preliminary result)   Collection Time: 01/03/17  8:35 AM  Result Value Ref Range Status   Specimen Description BLOOD LEFT ARM  Final   Special Requests BOTTLES DRAWN AEROBIC AND ANAEROBIC 5CC EA  Final   Culture NO GROWTH 4 DAYS  Final   Report Status PENDING  Incomplete  Culture, blood (routine  x 2)     Status: None (Preliminary result)   Collection Time: 01/03/17  8:38 AM  Result Value Ref Range Status   Specimen Description BLOOD LEFT ARM  Final   Special Requests IN PEDIATRIC  BOTTLE 2CC  Final   Culture NO GROWTH 4 DAYS  Final   Report Status PENDING  Incomplete  C difficile quick scan w PCR reflex     Status: Abnormal   Collection Time: 01/04/17 11:20 AM  Result Value Ref Range Status   C Diff antigen POSITIVE (A) NEGATIVE Final   C Diff toxin POSITIVE (A) NEGATIVE Final   C Diff interpretation Toxin producing C. difficile detected.  Final    Comment: CRITICAL RESULT CALLED TO, READ BACK BY AND VERIFIED WITH: J.COVINGTON RN AT [id number] 01/04/17 BY A.DAVIS     Imaging: Dg Abd Portable 1v  Result Date: 01/07/2017 CLINICAL DATA:  Abdominal pain EXAM: PORTABLE ABDOMEN - 1 VIEW COMPARISON:  CT abdomen and pelvis Apr 04, 2013 FINDINGS: There is no bowel dilatation or air-fluid level suggesting bowel obstruction. No free air. There is aortic atherosclerosis. IMPRESSION: No bowel obstruction or free air evident.  Aortic atherosclerosis. Electronically Signed   By: Lowella Grip III M.D.   On: 01/07/2017 13:23    Medications: I have reviewed the patient's current medications.  Assessment/Plan:  Acute Encephalopathy: His mental status seems to be improving overall. He was alert and oriented x 3 (only thing he couldn't tell me was day of week). His mental status change is likely related to his C diff infection which is being treated. I suspect he will continue to improve gradually. Keep blinds open. Avoid anticholinergics.   Attending note to follow.  Albin Felling, MD, MPH Internal Medicine Resident, PGY-III Pager: [phone] 01/08/2017, 8:52 AM

## 2017-01-08 NOTE — Progress Notes (Signed)
Inpatient Diabetes Program Recommendations  AACE/ADA: New Consensus Statement on Inpatient Glycemic Control (2015)  Target Ranges:  Prepandial:   less than 140 mg/dL      Peak postprandial:   less than 180 mg/dL (1-2 hours)      Critically ill patients:  140 - 180 mg/dL   Lab Results  Component Value Date   GLUCAP 258 (H) 01/08/2017   HGBA1C 8.6 (H) 01/02/2017    Review of Glycemic Control Results for Jared Hall, Jared Hall (MRN [id number]) as of 01/08/2017 12:13  Ref. Range 01/07/2017 16:37 01/07/2017 19:31 01/07/2017 22:44 01/08/2017 02:57 01/08/2017 08:27  Glucose-Capillary Latest Ref Range: 65 - 99 mg/dL 275 (H) 220 (H) 214 (H) 273 (H) 258 (H)   Diabetes history: DM2 Outpatient Diabetes medications: Lantus 40 units bid Current orders for Inpatient glycemic control:  Novolog sensitive tid with meals  Inpatient Diabetes Program Recommendations:   Please consider adding Lantus 20 units daily. Will follow.  Thank you, Nani Gasser. Kimmie Berggren, RN, MSN, CDE Inpatient Glycemic Control Team Team Pager [phone] (8am-5pm) 01/08/2017 12:13 PM

## 2017-01-08 NOTE — Progress Notes (Signed)
PROGRESS NOTE    Jared Hall  [id number] DOB: [birth year] DOA: 01/01/2017 PCP: Henrine Screws, MD   Brief Narrative: Jared Hall is a 83 y.o. male with medical history significant of hypertension, hyperlipidemia, chronic kidney disease stage III, prior CVA, CHF with last EF of 55% April 2016, diabetes mellitus, hyperlipidemia who was recently hospitalized from 12/05/2016- 12/15/2016 for bilateral lower extremity cellulitis and edema, acute hypoxic respiratory failure, who presents to the ED from nursing facility at [facility] with a four-day history of slurred speech, progressive confusion. Patient with slurred dysarthric speech with an expressive aphasia and a such history obtained from ED PA and from the chart. Per chart in ED PA it was noted that patient had had a four-day history of altered mental status with slurred speech and also noted to have a left facial droop. ED RN spoke with staff at the nursing home it was noticed that patient began to be more confused on 12/29/2016. The morning of admission patient was noted to be more confused and had multiple falls and only oriented to self. Patient subsequently sent to the ED.   Assessment & Plan:   Principal Problem:   CVA (cerebral vascular accident) ([place]) Active Problems:   History of CVA (cerebrovascular accident)   Dyslipidemia   AKI (acute kidney injury) ([place])   Essential hypertension, benign   Type 1 diabetes mellitus with peripheral circulatory complications (HCC)   Depression   GERD (gastroesophageal reflux disease)   Diabetic neuropathy (HCC)   Venous stasis dermatitis   DM type 2 with diabetic peripheral neuropathy (HCC)   Paroxysmal atrial fibrillation (HCC)   Chronic kidney disease, stage III (moderate)   Morbid obesity (HCC)   Dehydration   Anemia   Altered mental status   Acute respiratory failure with hypercapnia ([place])   1-Acute encephalopathy;  This could be multifactorial, secondary to medications, Vs  stroke vs hypercapnia.  MRI showed stroke. Neurology consulted. Neurology doesn't think that MRI finding explain patient mental status.  Holding sedative; gabapentin, amitryptiline, bupropion.  Ammonia normal at 19.  UA negative, Chest x ray ; atelectasis vs infiltrates lingula.  blood culture no growth to date.  EEG; p Less interactive  2-18;  ABG negative for hypercapnia. Neurology consulted, they suspect MS is related to delirium,  Infection. No further testing.  Per daughter in law patient has fallen twice at [facility]  -Patient didn't sleep last night 2-18. He is is sitting in recliner, would wake up and answer questions.  -resume Elavil to help him sleep at night.   2-Acute on chronic hypercapnic respiratory failure.  Resolved/  CCM consulted. BIPAP ordered. Patient refuse BIPAP/  Continue with lasix.   3-C Diff colitis.  Patient with multiples BM overnight. He report abdominal pain with BM. Leukocytosis this morning.  I have ask nurse to send C diff sample with next BM.  C diff came back positive. Will start vancomycin.  Discontinue cefepime.  KUB negative.    4-HTN; On  lasix.   5-LE cellulitis; improved.  Discontinue cefepime due to positive c diff.  discontinue doxy.  Monitor off Antibiotics.  Local care, add topical antibiotics. . Wound care consulted.   6-DM; SSI. Hold lantus.  Hypoglycemia; resolved/   Right parietal white matter sub acute stroke;  LDL 76. On aspirin and plavix.  HB;A1c; 8.6 Carotid doppler; limited, no significant stenosis.  Was on eliquis in the past, stopped due to increase risk for fall. Develops hematuria this admission.   Acute kidney  injury on chronic kidney disease stage III Cr peak to 1.7. Improved with IV fluids.  Cr has decreased to 1.0.  history of PSVT/paroxysmal A. fib Patient currently rate controlled. Patient used to be on eliquis which was reportedly discontinued and patient on Plavix.  Hematuria; resolved.    DVT  prophylaxis: resume Lovenox Code Status: DNR  Family Communication: daughter in law; 2-18 Disposition Plan: remain in the step down.    Consultants:   CCM  Neurology    Procedures:   ECHO; normal ef  Doppler. 1-39 %    Antimicrobials:   Doxy   Subjective: Patient didn't sleep last night. He is likely awake during nights and sleepy during days.     Objective: Vitals:   01/07/17 1929 01/07/17 2242 01/08/17 0228 01/08/17 0828  BP: 123/68 114/65 (!) 148/90 (!) 149/76  Pulse: (!) 110 (!) 114 (!) 111 100  Resp: (!) 27 (!) 22 (!) 21 (!) 26  Temp: 99.2 F (37.3 C) 98.9 F (37.2 C) 98.1 F (36.7 C) 97.9 F (36.6 C)  TempSrc: Oral Oral Oral Oral  SpO2: 92% 96% 96% 97%  Weight:      Height:        Intake/Output Summary (Last 24 hours) at 01/08/17 0832 Last data filed at 01/08/17 0500  Gross per 24 hour  Intake              300 ml  Output              650 ml  Net             -350 ml   Filed Weights   01/01/17 1422 01/01/17 2030  Weight: 119.7 kg (264 lb) 118.5 kg (261 lb 4.8 oz)    Examination:  General exam: Appears calm., less interacative, lethargic.  Respiratory system: bilateral ronchus . Respiratory effort normal. Cardiovascular system: S1 & S2 heard, RRR. No JVD, murmurs, rubs, gallops or clicks. No pedal edema. Gastrointestinal system: Abdomen is distended, soft and mild tender. No organomegaly or masses felt. Normal bowel sounds heard. Central nervous system: Alert and oriented. No focal neurological deficits. Extremities: trace edema., Skin: bilateral lower extremity with erythema.     Data Reviewed: I have personally reviewed following labs and imaging studies  CBC:  Recent Labs Lab 01/01/17 1507  01/03/17 0157 01/04/17 0450 01/05/17 0917 01/06/17 0329 01/07/17 0828  WBC 9.5  < > 10.3 12.5* 11.5* 13.1* 12.2*  NEUTROABS 6.7  --   --   --   --   --   --   HGB 10.6*  < > 11.1* 11.3* 12.1* 13.0 12.5*  HCT 35.6*  < > 37.3* 37.0* 39.2  40.9 39.9  MCV 69.4*  < > 70.2* 69.4* 67.7* 67.6* 67.7*  PLT 164  < > 136* 135* 149* 137* 142*  < > = values in this interval not displayed. Basic Metabolic Panel:  Recent Labs Lab 01/03/17 0157 01/04/17 0450 01/05/17 0917 01/06/17 0329 01/07/17 0828  NA 141 139 138 140 139  K 3.6 4.6 3.5 4.0 3.5  CL 98* 101 101 99* 96*  CO2 31 31 24 26 30   GLUCOSE 144* 166* 184* 164* 193*  BUN 10 9 10 11 16   CREATININE 1.08 1.03 0.96 1.10 1.24  CALCIUM 8.7* 8.5* 9.2 9.6 9.2   GFR: Estimated Creatinine Clearance: 59.1 mL/min (by C-G formula based on SCr of 1.24 mg/dL). Liver Function Tests:  Recent Labs Lab 01/01/17 1507  AST 24  ALT 19  ALKPHOS 68  BILITOT 0.7  PROT 5.9*  ALBUMIN 2.9*   No results for input(s): LIPASE, AMYLASE in the last 168 hours.  Recent Labs Lab 01/01/17 1507  AMMONIA 19   Coagulation Profile: No results for input(s): INR, PROTIME in the last 168 hours. Cardiac Enzymes: No results for input(s): CKTOTAL, CKMB, CKMBINDEX, TROPONINI in the last 168 hours. BNP (last 3 results) No results for input(s): PROBNP in the last 8760 hours. HbA1C: No results for input(s): HGBA1C in the last 72 hours. CBG:  Recent Labs Lab 01/07/17 1637 01/07/17 1931 01/07/17 2244 01/08/17 0257 01/08/17 0827  GLUCAP 275* 220* 214* 273* 258*   Lipid Profile: No results for input(s): CHOL, HDL, LDLCALC, TRIG, CHOLHDL, LDLDIRECT in the last 72 hours. Thyroid Function Tests: No results for input(s): TSH, T4TOTAL, FREET4, T3FREE, THYROIDAB in the last 72 hours. Anemia Panel: No results for input(s): VITAMINB12, FOLATE, FERRITIN, TIBC, IRON, RETICCTPCT in the last 72 hours. Sepsis Labs:  Recent Labs Lab 01/01/17 1650  LATICACIDVEN 0.88    Recent Results (from the past 240 hour(s))  Urine culture     Status: None   Collection Time: 01/01/17  5:23 PM  Result Value Ref Range Status   Specimen Description URINE, RANDOM  Final   Special Requests ADDED [id number] [phone]  Final    Culture NO GROWTH  Final   Report Status 01/03/2017 FINAL  Final  MRSA PCR Screening     Status: Abnormal   Collection Time: 01/02/17  2:42 PM  Result Value Ref Range Status   MRSA by PCR POSITIVE (A) NEGATIVE Final    Comment:        The GeneXpert MRSA Assay (FDA approved for NASAL specimens only), is one component of a comprehensive MRSA colonization surveillance program. It is not intended to diagnose MRSA infection nor to guide or monitor treatment for MRSA infections. RESULT CALLED TO, READ BACK BY AND VERIFIED WITH: D.EVERETTE,RN AT 1932 BY L.PITT 01/02/17   Culture, blood (routine x 2)     Status: None (Preliminary result)   Collection Time: 01/03/17  8:35 AM  Result Value Ref Range Status   Specimen Description BLOOD LEFT ARM  Final   Special Requests BOTTLES DRAWN AEROBIC AND ANAEROBIC 5CC EA  Final   Culture NO GROWTH 4 DAYS  Final   Report Status PENDING  Incomplete  Culture, blood (routine x 2)     Status: None (Preliminary result)   Collection Time: 01/03/17  8:38 AM  Result Value Ref Range Status   Specimen Description BLOOD LEFT ARM  Final   Special Requests IN PEDIATRIC BOTTLE 2CC  Final   Culture NO GROWTH 4 DAYS  Final   Report Status PENDING  Incomplete  C difficile quick scan w PCR reflex     Status: Abnormal   Collection Time: 01/04/17 11:20 AM  Result Value Ref Range Status   C Diff antigen POSITIVE (A) NEGATIVE Final   C Diff toxin POSITIVE (A) NEGATIVE Final   C Diff interpretation Toxin producing C. difficile detected.  Final    Comment: CRITICAL RESULT CALLED TO, READ BACK BY AND VERIFIED WITH: J.COVINGTON RN AT [id number] 01/04/17 BY A.DAVIS          Radiology Studies: Dg Abd Portable 1v  Result Date: 01/07/2017 CLINICAL DATA:  Abdominal pain EXAM: PORTABLE ABDOMEN - 1 VIEW COMPARISON:  CT abdomen and pelvis Apr 04, 2013 FINDINGS: There is no bowel dilatation or air-fluid level suggesting bowel obstruction. No free air. There is  aortic  atherosclerosis. IMPRESSION: No bowel obstruction or free air evident.  Aortic atherosclerosis. Electronically Signed   By: Lowella Grip III M.D.   On: 01/07/2017 13:23        Scheduled Meds: . aspirin  300 mg Rectal Daily   Or  . aspirin  325 mg Oral Daily  . clopidogrel  75 mg Oral Daily  . fluticasone  1 spray Each Nare Daily  . furosemide  40 mg Oral q1800  . [START ON 01/09/2017] furosemide  40 mg Oral Q breakfast  . insulin aspart  0-9 Units Subcutaneous TID WC  . metoprolol tartrate  25 mg Oral BID  . neomycin-bacitracin-polymyxin   Topical TID  . pantoprazole  40 mg Oral Daily  . simvastatin  20 mg Oral QHS  . sodium chloride  500 mL Intravenous Once  . vancomycin  125 mg Oral QID  . vitamin B-12  1,000 mcg Oral Daily   Continuous Infusions:    LOS: 7 days    Time spent: 35 minutes.     Elmarie Shiley, MD [facility] Pager [phone]  If 7PM-7AM, please contact night-coverage [email or web address] Password [facility] 01/08/2017, 8:32 AM

## 2017-01-08 NOTE — Consult Note (Addendum)
[place] Nurse wound consult note Reason for Consult: Consult requested for bilat legs; pt has cellulitis; the affected area line previously marked and is receeding.   Wound type: Pt has generalized erythremia and edema with dry flaking scaly skin to calves.  No open wounds or drainage. Dressing procedure/placement/frequency: No topical treatment is indicated.  Eucerin to treat dry scaly skin.  Discussed plan of care with patient and he verbalized understanding. Please re-consult if further assistance is needed.  Thank-you,  Julien Girt MSN, [place], [place], [place], [place]

## 2017-01-09 DIAGNOSIS — I10 Essential (primary) hypertension: Secondary | ICD-10-CM | POA: Diagnosis not present

## 2017-01-09 DIAGNOSIS — J9612 Chronic respiratory failure with hypercapnia: Secondary | ICD-10-CM | POA: Diagnosis not present

## 2017-01-09 DIAGNOSIS — R488 Other symbolic dysfunctions: Secondary | ICD-10-CM | POA: Diagnosis not present

## 2017-01-09 DIAGNOSIS — B351 Tinea unguium: Secondary | ICD-10-CM | POA: Diagnosis not present

## 2017-01-09 DIAGNOSIS — I639 Cerebral infarction, unspecified: Secondary | ICD-10-CM | POA: Diagnosis not present

## 2017-01-09 DIAGNOSIS — Z79899 Other long term (current) drug therapy: Secondary | ICD-10-CM | POA: Diagnosis not present

## 2017-01-09 DIAGNOSIS — Z8673 Personal history of transient ischemic attack (TIA), and cerebral infarction without residual deficits: Secondary | ICD-10-CM | POA: Diagnosis not present

## 2017-01-09 DIAGNOSIS — R21 Rash and other nonspecific skin eruption: Secondary | ICD-10-CM | POA: Diagnosis not present

## 2017-01-09 DIAGNOSIS — J9611 Chronic respiratory failure with hypoxia: Secondary | ICD-10-CM | POA: Diagnosis not present

## 2017-01-09 DIAGNOSIS — I5022 Chronic systolic (congestive) heart failure: Secondary | ICD-10-CM | POA: Diagnosis not present

## 2017-01-09 DIAGNOSIS — E1142 Type 2 diabetes mellitus with diabetic polyneuropathy: Secondary | ICD-10-CM | POA: Diagnosis not present

## 2017-01-09 DIAGNOSIS — G8929 Other chronic pain: Secondary | ICD-10-CM | POA: Diagnosis not present

## 2017-01-09 DIAGNOSIS — A4902 Methicillin resistant Staphylococcus aureus infection, unspecified site: Secondary | ICD-10-CM | POA: Diagnosis not present

## 2017-01-09 DIAGNOSIS — K219 Gastro-esophageal reflux disease without esophagitis: Secondary | ICD-10-CM | POA: Diagnosis not present

## 2017-01-09 DIAGNOSIS — E876 Hypokalemia: Secondary | ICD-10-CM | POA: Diagnosis not present

## 2017-01-09 DIAGNOSIS — E785 Hyperlipidemia, unspecified: Secondary | ICD-10-CM | POA: Diagnosis not present

## 2017-01-09 DIAGNOSIS — I480 Paroxysmal atrial fibrillation: Secondary | ICD-10-CM | POA: Diagnosis not present

## 2017-01-09 DIAGNOSIS — M6281 Muscle weakness (generalized): Secondary | ICD-10-CM | POA: Diagnosis not present

## 2017-01-09 DIAGNOSIS — J309 Allergic rhinitis, unspecified: Secondary | ICD-10-CM | POA: Diagnosis not present

## 2017-01-09 DIAGNOSIS — R2681 Unsteadiness on feet: Secondary | ICD-10-CM | POA: Diagnosis not present

## 2017-01-09 DIAGNOSIS — M25562 Pain in left knee: Secondary | ICD-10-CM | POA: Diagnosis not present

## 2017-01-09 DIAGNOSIS — G629 Polyneuropathy, unspecified: Secondary | ICD-10-CM | POA: Diagnosis not present

## 2017-01-09 DIAGNOSIS — I5032 Chronic diastolic (congestive) heart failure: Secondary | ICD-10-CM | POA: Diagnosis not present

## 2017-01-09 DIAGNOSIS — I6789 Other cerebrovascular disease: Secondary | ICD-10-CM | POA: Diagnosis not present

## 2017-01-09 DIAGNOSIS — A0472 Enterocolitis due to Clostridium difficile, not specified as recurrent: Secondary | ICD-10-CM | POA: Diagnosis not present

## 2017-01-09 DIAGNOSIS — R531 Weakness: Secondary | ICD-10-CM | POA: Diagnosis not present

## 2017-01-09 DIAGNOSIS — R5381 Other malaise: Secondary | ICD-10-CM | POA: Diagnosis not present

## 2017-01-09 DIAGNOSIS — R2689 Other abnormalities of gait and mobility: Secondary | ICD-10-CM | POA: Diagnosis not present

## 2017-01-09 DIAGNOSIS — R278 Other lack of coordination: Secondary | ICD-10-CM | POA: Diagnosis not present

## 2017-01-09 DIAGNOSIS — N400 Enlarged prostate without lower urinary tract symptoms: Secondary | ICD-10-CM | POA: Diagnosis not present

## 2017-01-09 DIAGNOSIS — G9340 Encephalopathy, unspecified: Secondary | ICD-10-CM | POA: Diagnosis not present

## 2017-01-09 DIAGNOSIS — I872 Venous insufficiency (chronic) (peripheral): Secondary | ICD-10-CM | POA: Diagnosis not present

## 2017-01-09 DIAGNOSIS — L0390 Cellulitis, unspecified: Secondary | ICD-10-CM | POA: Diagnosis not present

## 2017-01-09 DIAGNOSIS — L03119 Cellulitis of unspecified part of limb: Secondary | ICD-10-CM | POA: Diagnosis not present

## 2017-01-09 DIAGNOSIS — I63311 Cerebral infarction due to thrombosis of right middle cerebral artery: Secondary | ICD-10-CM | POA: Diagnosis not present

## 2017-01-09 DIAGNOSIS — E1149 Type 2 diabetes mellitus with other diabetic neurological complication: Secondary | ICD-10-CM | POA: Diagnosis not present

## 2017-01-09 LAB — CBC
HEMATOCRIT: 40.9 % (ref 39.0–52.0)
Hemoglobin: 12.8 g/dL — ABNORMAL LOW (ref 13.0–17.0)
MCH: 21.3 pg — ABNORMAL LOW (ref 26.0–34.0)
MCHC: 31.3 g/dL (ref 30.0–36.0)
MCV: 68.1 fL — AB (ref 78.0–100.0)
Platelets: 195 10*3/uL (ref 150–400)
RBC: 6.01 MIL/uL — AB (ref 4.22–5.81)
RDW: 15.7 % — ABNORMAL HIGH (ref 11.5–15.5)
WBC: 12.6 10*3/uL — AB (ref 4.0–10.5)

## 2017-01-09 LAB — GLUCOSE, CAPILLARY
GLUCOSE-CAPILLARY: 230 mg/dL — AB (ref 65–99)
Glucose-Capillary: 215 mg/dL — ABNORMAL HIGH (ref 65–99)
Glucose-Capillary: 236 mg/dL — ABNORMAL HIGH (ref 65–99)
Glucose-Capillary: 254 mg/dL — ABNORMAL HIGH (ref 65–99)

## 2017-01-09 LAB — BASIC METABOLIC PANEL
ANION GAP: 11 (ref 5–15)
BUN: 15 mg/dL (ref 6–20)
CHLORIDE: 97 mmol/L — AB (ref 101–111)
CO2: 32 mmol/L (ref 22–32)
Calcium: 9.6 mg/dL (ref 8.9–10.3)
Creatinine, Ser: 1.14 mg/dL (ref 0.61–1.24)
GFR calc non Af Amer: 58 mL/min — ABNORMAL LOW (ref >60)
Glucose, Bld: 310 mg/dL — ABNORMAL HIGH (ref 65–99)
POTASSIUM: 3.8 mmol/L (ref 3.5–5.1)
SODIUM: 140 mmol/L (ref 135–145)

## 2017-01-09 MED ORDER — VANCOMYCIN 50 MG/ML ORAL SOLUTION: 125.0000 mg | Freq: Four times a day (QID) | ORAL | 0 refills | Status: DC

## 2017-01-09 MED ORDER — GABAPENTIN 100 MG PO CAPS: 100.0000 mg | ORAL_CAPSULE | Freq: Three times a day (TID) | ORAL | 0 refills | Status: DC

## 2017-01-09 MED ORDER — BACITRACIN-NEOMYCIN-POLYMYXIN OINTMENT TUBE: 1.0000 "application " | TOPICAL_OINTMENT | Freq: Three times a day (TID) | CUTANEOUS | 0 refills | Status: DC

## 2017-01-09 MED ORDER — INSULIN GLARGINE 100 UNIT/ML ~~LOC~~ SOLN: 20.0000 [IU] | Freq: Every day | SUBCUTANEOUS | 11 refills | Status: DC

## 2017-01-09 MED ORDER — HYDROCERIN EX CREA: 1.0000 "application " | TOPICAL_CREAM | Freq: Every day | CUTANEOUS | 0 refills | Status: DC

## 2017-01-09 NOTE — Progress Notes (Signed)
Patient discharged from room 52M-04 via stretcher with PTAR.  Patient alert.  All personal belongings with Patient.

## 2017-01-09 NOTE — Clinical Social Work Note (Signed)
Clinical Social Worker facilitated patient discharge including contacting patient family and facility to confirm patient discharge plans.  Clinical information faxed to facility and family agreeable with plan.  CSW arranged ambulance transport via [facility] to [facility].  RN to call report prior to discharge.  Clinical Social Worker will sign off for now as social work intervention is no longer needed. Please consult us again if new need arises.  Jesse Christa Fasig, LCSW [phone] 

## 2017-01-09 NOTE — Care Management Note (Signed)
Case Management Note  Patient Details  Name: Jared [name] MRN: [id number] Date of Birth: Dec 09, 1930  Subjective/Objective:                    Action/Plan: Pt discharging to [facility] today. No further needs per CM.   Expected Discharge Date:  01/09/17               Expected Discharge Plan:  Skilled Nursing Facility  In-House Referral:  Clinical Social Work  Discharge planning Services  CM Consult  Post Acute Care Choice:    Choice offered to:     DME Arranged:    DME Agency:     HH Arranged:    Reserve Agency:     Status of Service:  Completed, signed off  If discussed at [employer], dates discussed:    Additional Comments:  Pollie Friar, RN 01/09/2017, 12:14 PM

## 2017-01-09 NOTE — Progress Notes (Signed)
Inpatient Diabetes Program Recommendations  AACE/ADA: New Consensus Statement on Inpatient Glycemic Control (2015)  Target Ranges:  Prepandial:   less than 140 mg/dL      Peak postprandial:   less than 180 mg/dL (1-2 hours)      Critically ill patients:  140 - 180 mg/dL   Lab Results  Component Value Date   GLUCAP 236 (H) 01/09/2017   HGBA1C 8.6 (H) 01/02/2017   Results for [name], Jared C (MRN [id number]) as of 01/09/2017 09:50  Ref. Range 01/08/2017 08:27 01/08/2017 14:07 01/08/2017 16:24 01/08/2017 19:50 01/08/2017 23:50 01/09/2017 04:12 01/09/2017 09:00  Glucose-Capillary Latest Ref Range: 65 - 99 mg/dL 258 (H) 307 (H) 230 (H) 218 (H) 208 (H) 215 (H) 236 (H)   Diabetes history:DM2, elderly, obese Outpatient Diabetes medications: Lantus 40 units bid Current orders for Inpatient glycemic control:  Novolog sensitive tid with meals  Inpatient Diabetes Program Recommendations:  Noted that CBG's remain in the 200's to 300's over past 24 hours.   Please consider increasing to Lantus 20 units daily.  Will follow.  Thank you,  Windy Carina, RN, MSN Diabetes Coordinator Inpatient Diabetes Program [phone] (Team Pager)

## 2017-01-09 NOTE — Discharge Summary (Signed)
Physician Discharge Summary  LAIF KOHLHOFF [id number] DOB: Apr 01, 1931 DOA: 01/01/2017  PCP: Henrine Screws, MD  Admit date: 01/01/2017 Discharge date: 01/09/2017  Admitted From: Home  Disposition:  SNF  Recommendations for Outpatient Follow-up:  1. Follow up with PCP in 1-2 weeks 2. Please obtain BMP/CBC in one week 3. Monitor redness of lower extremities.  4. Adjust insulin as needed.    Discharge Condition: Stable.  CODE STATUS: DNR Diet recommendation:  Carb Modified  Brief/Interim Summary: Lyn C Hinesis a 83 y.o.malewith medical history significant of hypertension, hyperlipidemia, chronic kidney disease stage III, prior CVA, CHF with last EF of 55% April 2016, diabetes mellitus, hyperlipidemia who was recently hospitalized from 12/05/2016- 12/15/2016 for bilateral lower extremity cellulitis and edema, acute hypoxic respiratory failure, who presents to the ED from nursing facility at [facility] with a four-day history of slurred speech, progressive confusion. Patient with slurred dysarthric speech with an expressive aphasia and a such history obtained from ED PA and from the chart. Per chart in ED PA it was noted that patient had had a four-day history of altered mental status with slurred speech and also noted to have a left facial droop. ED RN spoke with staff at the nursing home it was noticed that patient began to be more confused on 12/29/2016. The morning of admission patient was noted to be more confused and had multiple falls and only oriented to self. Patient subsequently sent to the ED.   Assessment & Plan: 1-Acute encephalopathy;  This could be multifactorial, secondary to medications, Vs stroke vs hypercapnia.  MRI showed stroke. Neurology consulted. Neurology doesn't think that MRI finding explain patient mental status.  Holding sedative; gabapentin, amitryptiline, bupropion.  Ammonia normal at 19. UA negative, Chest x ray ; atelectasis vs infiltrates lingula.   blood culture no growth to date.  EEG; negative for seizure.  Less interactive  2-18;  ABG negative for hypercapnia. Neurology consulted, they suspect MS is related to delirium,  Infection. No further testing.  Per daughter in law patient has fallen twice at SNF  -resume Elavil to help him sleep at night. Resume low dose gabapentin, patient more alert.   2-Acute on chronic hypercapnic respiratory failure.  Resolved/  CCM consulted. BIPAP ordered. Patient refuse BIPAP/  Continue with lasix.   3-C Diff colitis.  Patient with multiples BM overnight. He report abdominal pain with BM. Leukocytosis stable. . Discontinue cefepime.  C diff came back positive. Started  Vancomycin day 6/14.  KUB negative.  Discharge on vancomycin.   4-HTN; On  lasix.   5-LE cellulitis; improved. Please monitor closely  Discontinue cefepime due to positive c diff.  discontinue doxy.  Monitor off Antibiotics. Redness improving.  Local care, add topical antibiotics. . Wound care consulted.   6-DM; SSI. Increase lantus to 30 units.  Hypoglycemia; resolved/   Right parietal white matter sub acute stroke;  LDL 76. On aspirin and plavix.  HB;A1c; 8.6 Carotid doppler; limited, no significant stenosis.  Was on eliquis in the past, stopped due to increase risk for fall. Develops hematuria this admission.   Acute kidney injury on chronic kidney disease stage III Cr peak to 1.7. Improved with IV fluids.  Cr has decreased to 1.0.  history of PSVT/paroxysmal A. fib Patient currently rate controlled. Patient used to be on eliquis which was reportedly discontinued and patient on Plavix.  Hematuria; resolved.    Discharge Diagnoses:  Principal Problem:   CVA (cerebral vascular accident) [facility]) Active Problems:   History  of CVA (cerebrovascular accident)   Dyslipidemia   AKI (acute kidney injury) ([place])   Essential hypertension, benign   Type 1 diabetes mellitus with peripheral circulatory  complications (HCC)   Depression   GERD (gastroesophageal reflux disease)   Diabetic neuropathy (HCC)   Venous stasis dermatitis   DM type 2 with diabetic peripheral neuropathy (HCC)   Paroxysmal atrial fibrillation (HCC)   Chronic kidney disease, stage III (moderate)   Morbid obesity (HCC)   Dehydration   Anemia   Altered mental status   Acute respiratory failure with hypercapnia (HCC)   Acute encephalopathy   Acute ischemic stroke [facility])   Generalized weakness   Cerebrovascular disease    Discharge Instructions  Discharge Instructions    Diet - low sodium heart healthy    Complete by:  As directed    Increase activity slowly    Complete by:  As directed      Allergies as of 01/09/2017      Reactions   Ativan [lorazepam] Other (See Comments)   Flailing of extremities noted immediately s/p IV administration.   Flomax [tamsulosin Hcl] Other (See Comments)   Excessive tearing   Glucophage [metformin Hcl] Diarrhea      Medication List    STOP taking these medications   buPROPion 150 MG 12 hr tablet Commonly known as:  WELLBUTRIN SR   guaiFENesin 600 MG 12 hr tablet Commonly known as:  MUCINEX   moxifloxacin 400 MG tablet Commonly known as:  AVELOX   PROBIOTIC PO   sennosides-docusate sodium 8.6-50 MG tablet Commonly known as:  SENOKOT-S   traMADol 50 MG tablet Commonly known as:  ULTRAM     TAKE these medications   amitriptyline 25 MG tablet Commonly known as:  ELAVIL Take 25 mg by mouth at bedtime.   aspirin EC 81 MG tablet Take 81 mg by mouth daily.   clopidogrel 75 MG tablet Commonly known as:  PLAVIX Take 75 mg by mouth daily.   clotrimazole-betamethasone lotion Commonly known as:  LOTRISONE Apply 1 application topically 2 (two) times daily as needed (antifungal).   fluticasone 50 MCG/ACT nasal spray Commonly known as:  FLONASE Place 1 spray into both nostrils daily.   furosemide 40 MG tablet Commonly known as:  LASIX Take 40-80 mg by  mouth daily. Take 80 mg QAM and 40 mg QPM for CHF   gabapentin 100 MG capsule Commonly known as:  NEURONTIN Take 1 capsule (100 mg total) by mouth 3 (three) times daily. What changed:  medication strength  how much to take   hydrocerin Crea Apply 1 application topically daily.   insulin glargine 100 UNIT/ML injection Commonly known as:  LANTUS Inject 0.2 mLs (20 Units total) into the skin daily. What changed:  how much to take  when to take this   metoprolol tartrate 25 MG tablet Commonly known as:  LOPRESSOR Take 1 tablet (25 mg total) by mouth 2 (two) times daily.   neomycin-bacitracin-polymyxin Oint Commonly known as:  NEOSPORIN Apply 1 application topically 3 (three) times daily. Apply to legs   pantoprazole 40 MG tablet Commonly known as:  PROTONIX Take 40 mg by mouth daily.   potassium chloride SA 20 MEQ tablet Commonly known as:  K-DUR,KLOR-CON Take 20 mEq by mouth 2 (two) times daily.   simvastatin 20 MG tablet Commonly known as:  ZOCOR Take 20 mg by mouth daily.   terazosin 2 MG capsule Commonly known as:  HYTRIN Take 4 mg by mouth daily. Take  two 2-mg tablets to = 4 mg   terbinafine 250 MG tablet Commonly known as:  LAMISIL Take 250 mg by mouth daily. continous   vancomycin 50 mg/mL oral solution Commonly known as:  VANCOCIN Take 2.5 mLs (125 mg total) by mouth 4 (four) times daily.   VITAMIN B COMPLEX PO Take 1 tablet by mouth daily.   vitamin B-12 1000 MCG tablet Commonly known as:  CYANOCOBALAMIN Take 1,000 mcg by mouth daily.       Contact information for follow-up providers    Dennie Bible, NP. Schedule an appointment as soon as possible for a visit in 6 week(s).   Specialty:  Family Medicine Contact information: [address] [place] [address] [phone]            Contact information for after-discharge care    Destination    [facility] .   Specialty:  Skilled Nursing Facility Contact  information: [place] [address] [phone]                 Allergies  Allergen Reactions  . Ativan [Lorazepam] Other (See Comments)    Flailing of extremities noted immediately s/p IV administration.  [name] Kitchen Flomax [Tamsulosin Hcl] Other (See Comments)    Excessive tearing  . Glucophage [Metformin Hcl] Diarrhea    Consultations:  Neurology    Procedures/Studies: Ct Angio Head W Or Wo Contrast  Result Date: 01/03/2017 CLINICAL DATA:  Stroke EXAM: CT ANGIOGRAPHY HEAD AND NECK TECHNIQUE: Multidetector CT imaging of the head and neck was performed using the standard protocol during bolus administration of intravenous contrast. Multiplanar CT image reconstructions and MIPs were obtained to evaluate the vascular anatomy. Carotid stenosis measurements (when applicable) are obtained utilizing NASCET criteria, using the distal internal carotid diameter as the denominator. COMPARISON:  Brain MRI 01/01/2017 FINDINGS: CT HEAD FINDINGS Brain: No mass lesion, intraparenchymal hemorrhage or extra-axial collection. No evidence of acute cortical infarct. There is periventricular hypoattenuation compatible with chronic microvascular disease. Old right cerebellar infarct. Skull: Normal visualized skull base, calvarium and extracranial soft tissues. Sinuses/Orbits: No sinus fluid levels or advanced mucosal thickening. No mastoid effusion. Normal orbits. CTA NECK FINDINGS Aortic arch: There is no aneurysm or dissection of the visualized ascending aorta or aortic arch. There is a normal 3 vessel branching pattern. The visualized proximal subclavian arteries are normal. There is atherosclerotic calcification within the aortic arch. Right carotid system: The right common carotid origin is widely patent. There is no common carotid or internal carotid artery dissection or aneurysm. No hemodynamically significant stenosis. There is mild atherosclerotic calcification at the right carotid  bifurcation. Left carotid system: The left common carotid origin is widely patent. There is no common carotid or internal carotid artery dissection or aneurysm. No hemodynamically significant stenosis. Vertebral arteries: The vertebral system is left dominant. There is atherosclerotic calcification of the left vertebral artery origin, which remains widely patent. Both vertebral arteries are normal to their confluence with the basilar artery. Skeleton: There is no bony spinal canal stenosis. No lytic or blastic lesions. Other neck: The nasopharynx is clear. The oropharynx and hypopharynx are normal. The epiglottis is normal. The supraglottic larynx, glottis and subglottic larynx are normal. No retropharyngeal collection. The parapharyngeal spaces are preserved. The parotid and submandibular glands are normal. No sialolithiasis or salivary ductal dilatation. The thyroid gland is normal. There is no cervical lymphadenopathy. Upper chest: No pneumothorax or pleural effusion. No nodules or masses. Lingular atelectasis. Review of the MIP  images confirms the above findings CTA HEAD FINDINGS Anterior circulation: --Intracranial internal carotid arteries: There is atherosclerotic calcification of the right cavernous and left cavernous and clinoid segments without hemodynamically significant stenosis. --Anterior cerebral arteries: Normal. --Middle cerebral arteries: Normal. --Posterior communicating arteries: Absent bilaterally. Posterior circulation: --Posterior cerebral arteries: Normal. --Superior cerebellar arteries: Normal. --Basilar artery: Normal. --Anterior inferior cerebellar arteries: Normal. --Posterior inferior cerebellar arteries: Normal. Venous sinuses: As permitted by contrast timing, patent. Anatomic variants: None Delayed phase: No parenchymal contrast enhancement. Review of the MIP images confirms the above findings IMPRESSION: 1. No CT evidence of acute intracranial abnormality. The small infarct seen on  the prior MRI is not visible on this study. 2. No occlusion or hemodynamically significant stenosis of the intracranial or cervical arteries. 3. Aortic atherosclerosis. Electronically Signed   By: Ulyses Jarred M.D.   On: 01/03/2017 13:44   Dg Chest 2 View  Result Date: 01/01/2017 CLINICAL DATA:  Altered mental status EXAM: CHEST  2 VIEW COMPARISON:  12/10/2016 FINDINGS: Cardiomegaly again noted. Stable chronic elevation of left hemidiaphragm and prominent fat pad in left cardiophrenic angle. There is linear atelectasis or infiltrate in lingula. No pulmonary edema. Right lung is clear IMPRESSION: Stable chronic elevation of left hemidiaphragm and prominent fat pad in left cardiophrenic angle. There is linear atelectasis or infiltrate in lingula. No pulmonary edema. Right lung is clear Electronically Signed   By: Lahoma Crocker M.D.   On: 01/01/2017 16:25   Ct Head Wo Contrast  Result Date: 01/01/2017 CLINICAL DATA:  Pt has very altered mental status talking in undistinguished words and wavings arms around EXAM: CT HEAD WITHOUT CONTRAST CT CERVICAL SPINE WITHOUT CONTRAST TECHNIQUE: Multidetector CT imaging of the head and cervical spine was performed following the standard protocol without intravenous contrast. Multiplanar CT image reconstructions of the cervical spine were also generated. COMPARISON:  03/14/2015 and 03/15/2015 FINDINGS: CT HEAD FINDINGS Brain: There is moderate central and cortical atrophy. Periventricular white matter changes are consistent with small vessel disease. Stable appearance of subdural hygroma in the posterior fossa. Remote right cerebellar infarct. There is no intra or extra-axial mass lesion. The basilar cisterns and ventricles have a normal appearance. There is no CT evidence for acute infarction or hemorrhage. Vascular: There is atherosclerotic calcification of the carotid siphons. Skull: No calvarial fracture. Sinuses/Orbits: No acute finding. Other: None CT CERVICAL SPINE  FINDINGS Alignment: There is significant degenerative changes in mid and lower cervical spine. Degenerative anterolisthesis of C5 on C6 measures approximately 4 mm. Skull base and vertebrae: No acute fracture. No primary bone lesion or focal pathologic process. Soft tissues and spinal canal: No prevertebral fluid or swelling. No visible canal hematoma. Disc levels: Significant disc height loss throughout the cervical spine. Bilateral foraminal narrowing at C3-4. Bilateral foraminal narrowing at C4-5. Bilateral foraminal narrowing at C6-7. Significant facet hypertrophy at all levels. Upper chest: Unremarkable. Other: Study quality is degraded by patient motion artifact. IMPRESSION: 1. Atrophy and small vessel disease. 2. 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 3. Remote lacunar infarct of the right cerebellar hemisphere. 4.  No evidence for acute CT abnormality. 5. Significant cervical spine degenerative changes without evidence for acute abnormality. Electronically Signed   By: Nolon Nations M.D.   On: 01/01/2017 16:10   Ct Angio Neck W Or Wo Contrast  Result Date: 01/03/2017 CLINICAL DATA:  Stroke EXAM: CT ANGIOGRAPHY HEAD AND NECK TECHNIQUE: Multidetector CT imaging of the head and neck was performed using the standard protocol during bolus administration of  intravenous contrast. Multiplanar CT image reconstructions and MIPs were obtained to evaluate the vascular anatomy. Carotid stenosis measurements (when applicable) are obtained utilizing NASCET criteria, using the distal internal carotid diameter as the denominator. COMPARISON:  Brain MRI 01/01/2017 FINDINGS: CT HEAD FINDINGS Brain: No mass lesion, intraparenchymal hemorrhage or extra-axial collection. No evidence of acute cortical infarct. There is periventricular hypoattenuation compatible with chronic microvascular disease. Old right cerebellar infarct. Skull: Normal visualized skull base, calvarium and extracranial soft tissues.  Sinuses/Orbits: No sinus fluid levels or advanced mucosal thickening. No mastoid effusion. Normal orbits. CTA NECK FINDINGS Aortic arch: There is no aneurysm or dissection of the visualized ascending aorta or aortic arch. There is a normal 3 vessel branching pattern. The visualized proximal subclavian arteries are normal. There is atherosclerotic calcification within the aortic arch. Right carotid system: The right common carotid origin is widely patent. There is no common carotid or internal carotid artery dissection or aneurysm. No hemodynamically significant stenosis. There is mild atherosclerotic calcification at the right carotid bifurcation. Left carotid system: The left common carotid origin is widely patent. There is no common carotid or internal carotid artery dissection or aneurysm. No hemodynamically significant stenosis. Vertebral arteries: The vertebral system is left dominant. There is atherosclerotic calcification of the left vertebral artery origin, which remains widely patent. Both vertebral arteries are normal to their confluence with the basilar artery. Skeleton: There is no bony spinal canal stenosis. No lytic or blastic lesions. Other neck: The nasopharynx is clear. The oropharynx and hypopharynx are normal. The epiglottis is normal. The supraglottic larynx, glottis and subglottic larynx are normal. No retropharyngeal collection. The parapharyngeal spaces are preserved. The parotid and submandibular glands are normal. No sialolithiasis or salivary ductal dilatation. The thyroid gland is normal. There is no cervical lymphadenopathy. Upper chest: No pneumothorax or pleural effusion. No nodules or masses. Lingular atelectasis. Review of the MIP images confirms the above findings CTA HEAD FINDINGS Anterior circulation: --Intracranial internal carotid arteries: There is atherosclerotic calcification of the right cavernous and left cavernous and clinoid segments without hemodynamically significant  stenosis. --Anterior cerebral arteries: Normal. --Middle cerebral arteries: Normal. --Posterior communicating arteries: Absent bilaterally. Posterior circulation: --Posterior cerebral arteries: Normal. --Superior cerebellar arteries: Normal. --Basilar artery: Normal. --Anterior inferior cerebellar arteries: Normal. --Posterior inferior cerebellar arteries: Normal. Venous sinuses: As permitted by contrast timing, patent. Anatomic variants: None Delayed phase: No parenchymal contrast enhancement. Review of the MIP images confirms the above findings IMPRESSION: 1. No CT evidence of acute intracranial abnormality. The small infarct seen on the prior MRI is not visible on this study. 2. No occlusion or hemodynamically significant stenosis of the intracranial or cervical arteries. 3. Aortic atherosclerosis. Electronically Signed   By: Ulyses Jarred M.D.   On: 01/03/2017 13:44   Ct Chest W Contrast  Result Date: 12/11/2016 CLINICAL DATA:  Shortness of breath and lower extremity edema EXAM: CT CHEST WITH CONTRAST TECHNIQUE: Multidetector CT imaging of the chest was performed during intravenous contrast administration. CONTRAST:  75 mL ISOVUE-300 IOPAMIDOL (ISOVUE-300) INJECTION 61% COMPARISON:  Chest radiograph chair 16 2018 FINDINGS: Cardiovascular: There is no demonstrable thoracic aortic aneurysm or dissection. There is moderate atherosclerotic calcification in the aorta. There are scattered foci of calcification in the proximal great vessels. There are multiple scattered foci of coronary artery calcification. Pericardium is not thickened. Mediastinum/Nodes: Thyroid appears unremarkable. There is fairly extensive mediastinal fat diffusely. There is no evident thoracic adenopathy. There is moderate esophageal wall thickening in the midesophagus. Lungs/Pleura: There is patchy atelectasis in the  posterior segment left lower lobe as well as in portions of the inferior lingula and left lower lobe. Similar atelectatic  changes noted in the posterior right base. There is a small area of airspace consolidation in the posterior left base. There is no appreciable pleural effusion or pleural thickening. Upper Abdomen: There are small gallstones within the gallbladder. The visualized upper abdominal structures otherwise appear unremarkable. Musculoskeletal: There is degenerative change in the thoracic spine. There are no blastic or lytic bone lesions. IMPRESSION: Areas of patchy atelectasis, primarily on the left, with a small area of consolidation posterior left base. No evident adenopathy. Multiple foci of atherosclerotic calcification including multiple foci of coronary artery calcification. Several small gallstones noted in gallbladder. Thickening of the mid esophageal wall may be indicative of chronic reflux type esophagitis. Electronically Signed   By: Lowella Grip III M.D.   On: 12/11/2016 07:09   Ct Cervical Spine Wo Contrast  Result Date: 01/01/2017 CLINICAL DATA:  Pt has very altered mental status talking in undistinguished words and wavings arms around EXAM: CT HEAD WITHOUT CONTRAST CT CERVICAL SPINE WITHOUT CONTRAST TECHNIQUE: Multidetector CT imaging of the head and cervical spine was performed following the standard protocol without intravenous contrast. Multiplanar CT image reconstructions of the cervical spine were also generated. COMPARISON:  03/14/2015 and 03/15/2015 FINDINGS: CT HEAD FINDINGS Brain: There is moderate central and cortical atrophy. Periventricular white matter changes are consistent with small vessel disease. Stable appearance of subdural hygroma in the posterior fossa. Remote right cerebellar infarct. There is no intra or extra-axial mass lesion. The basilar cisterns and ventricles have a normal appearance. There is no CT evidence for acute infarction or hemorrhage. Vascular: There is atherosclerotic calcification of the carotid siphons. Skull: No calvarial fracture. Sinuses/Orbits: No acute  finding. Other: None CT CERVICAL SPINE FINDINGS Alignment: There is significant degenerative changes in mid and lower cervical spine. Degenerative anterolisthesis of C5 on C6 measures approximately 4 mm. Skull base and vertebrae: No acute fracture. No primary bone lesion or focal pathologic process. Soft tissues and spinal canal: No prevertebral fluid or swelling. No visible canal hematoma. Disc levels: Significant disc height loss throughout the cervical spine. Bilateral foraminal narrowing at C3-4. Bilateral foraminal narrowing at C4-5. Bilateral foraminal narrowing at C6-7. Significant facet hypertrophy at all levels. Upper chest: Unremarkable. Other: Study quality is degraded by patient motion artifact. IMPRESSION: 1. Atrophy and small vessel disease. 2. 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 3. Remote lacunar infarct of the right cerebellar hemisphere. 4.  No evidence for acute CT abnormality. 5. Significant cervical spine degenerative changes without evidence for acute abnormality. Electronically Signed   By: Nolon Nations M.D.   On: 01/01/2017 16:10   Mr Brain Wo Contrast  Result Date: 01/01/2017 CLINICAL DATA:  Initial evaluation for acute slurred speech, progressive confusion. EXAM: MRI HEAD WITHOUT CONTRAST TECHNIQUE: Multiplanar, multiecho pulse sequences of the brain and surrounding structures were obtained without intravenous contrast. COMPARISON:  Prior CT from earlier the same day. FINDINGS: Brain: The study is limited as the patient was unable to tolerate the full length of the exam. Additionally, images provided are moderately degraded by motion artifact. Diffuse prominence of the CSF containing spaces is compatible with generalized age-related cerebral atrophy. Patchy T2/FLAIR hyperintensity within the periventricular and deep white matter both cerebral hemispheres most consistent with chronic microvascular ischemic disease, fairly advanced in nature. Chronic microvascular ischemic  changes present within the pons. Remote bilateral cerebellar infarcts noted. Additional focus of cortical encephalomalacia within the  right parietal lobe compatible with remote ischemic infarct as well. There is a punctate 5-6 mm focus of diffusion abnormality within the right periatrial white matter (series 3, image 31), suspicious for a small acute/ early subacute small vessel ischemic infarct. No associated mass effect. No definite associated hemorrhage, although evaluation for blood products limited on this exam. No other abnormal foci of restricted diffusion to suggest acute or subacute ischemia. Gray-white matter differentiation otherwise maintained. No mass lesion, midline shift, or mass effect. Ventricular prominence related to global parenchymal volume loss without hydrocephalus. No extra-axial fluid collection. Major dural sinuses are grossly patent. Vascular: Major intracranial vascular flow voids are maintained. Right vertebral artery hypoplastic. Skull and upper cervical spine: Craniocervical junction grossly unremarkable, although not well evaluated on this limited exam. Scalp soft tissues unremarkable. Calvarium intact. Bone marrow signal intensity grossly normal. Sinuses/Orbits: Globes and orbital soft tissues within normal limits. Paranasal sinuses are largely clear. Small bilateral mastoid effusions, left greater than right. Inner ear structures grossly normal. IMPRESSION: 1. Limited study due to patient's inability to tolerate the full length of the exam and motion artifact. 2. 5-6 mm focus of diffusion abnormality within the right periatrial white matter, suspicious for a small acute/early subacute small vessel ischemic infarct. 3. No other acute intracranial process identified. 4. Advanced cerebral atrophy with chronic microvascular ischemic disease and scattered remote ischemic infarcts as above. Electronically Signed   By: Jeannine Boga M.D.   On: 01/01/2017 23:17   Dg Abd Portable  1v  Result Date: 01/07/2017 CLINICAL DATA:  Abdominal pain EXAM: PORTABLE ABDOMEN - 1 VIEW COMPARISON:  CT abdomen and pelvis Apr 04, 2013 FINDINGS: There is no bowel dilatation or air-fluid level suggesting bowel obstruction. No free air. There is aortic atherosclerosis. IMPRESSION: No bowel obstruction or free air evident.  Aortic atherosclerosis. Electronically Signed   By: Lowella Grip III M.D.   On: 01/07/2017 13:23     Subjective:   Discharge Exam: Vitals:   01/09/17 0900 01/09/17 0932  BP:  (!) 152/67  Pulse:  81  Resp:    Temp: 97.7 F (36.5 C)    Vitals:   01/09/17 0100 01/09/17 0400 01/09/17 0900 01/09/17 0932  BP: 140/70 (!) 168/75  (!) 152/67  Pulse: 88 83  81  Resp: 20 20    Temp: 98 F (36.7 C) 97.7 F (36.5 C) 97.7 F (36.5 C)   TempSrc: Oral Oral  Oral  SpO2: 97% 94%  96%  Weight:      Height:        General: Pt is alert, awake, not in acute distress Cardiovascular: RRR, S1/S2 +, no rubs, no gallops Respiratory: CTA bilaterally, no wheezing, no rhonchi Abdominal: Soft, NT, ND, bowel sounds + Extremities: no edema, no cyanosis    The results of significant diagnostics from this hospitalization (including imaging, microbiology, ancillary and laboratory) are listed below for reference.     Microbiology: Recent Results (from the past 240 hour(s))  Urine culture     Status: None   Collection Time: 01/01/17  5:23 PM  Result Value Ref Range Status   Specimen Description URINE, RANDOM  Final   Special Requests ADDED [id number]  Final   Culture NO GROWTH  Final   Report Status 01/03/2017 FINAL  Final  MRSA PCR Screening     Status: Abnormal   Collection Time: 01/02/17  2:42 PM  Result Value Ref Range Status   MRSA by PCR POSITIVE (A) NEGATIVE Final    Comment:  The GeneXpert MRSA Assay (FDA approved for NASAL specimens only), is one component of a comprehensive MRSA colonization surveillance program. It is not intended to diagnose  MRSA infection nor to guide or monitor treatment for MRSA infections. RESULT CALLED TO, READ BACK BY AND VERIFIED WITH: D.EVERETTE,RN AT 1932 BY L.PITT 01/02/17   Culture, blood (routine x 2)     Status: None   Collection Time: 01/03/17  8:35 AM  Result Value Ref Range Status   Specimen Description BLOOD LEFT ARM  Final   Special Requests BOTTLES DRAWN AEROBIC AND ANAEROBIC 5CC EA  Final   Culture NO GROWTH 5 DAYS  Final   Report Status 01/08/2017 FINAL  Final  Culture, blood (routine x 2)     Status: None   Collection Time: 01/03/17  8:38 AM  Result Value Ref Range Status   Specimen Description BLOOD LEFT ARM  Final   Special Requests IN PEDIATRIC BOTTLE 2CC  Final   Culture NO GROWTH 5 DAYS  Final   Report Status 01/08/2017 FINAL  Final  C difficile quick scan w PCR reflex     Status: Abnormal   Collection Time: 01/04/17 11:20 AM  Result Value Ref Range Status   C Diff antigen POSITIVE (A) NEGATIVE Final   C Diff toxin POSITIVE (A) NEGATIVE Final   C Diff interpretation Toxin producing C. difficile detected.  Final    Comment: CRITICAL RESULT CALLED TO, READ BACK BY AND VERIFIED WITH: J.COVINGTON RN AT 1249 01/04/17 BY A.DAVIS      Labs: BNP (last 3 results)  Recent Labs  12/05/16 1741 12/09/16 1312 01/01/17 1600  BNP 37.8 119.6* [id number]   Basic Metabolic Panel:  Recent Labs Lab 01/05/17 0917 01/06/17 0329 01/07/17 0828 01/08/17 1052 01/08/17 1842 01/09/17 0901  NA 138 140 139 140  --  140  K 3.5 4.0 3.5 3.4* 3.6 3.8  CL 101 99* 96* 97*  --  97*  CO2 24 26 30 31   --  32  GLUCOSE 184* 164* 193* 324*  --  310*  BUN 10 11 16 17   --  15  CREATININE 0.96 1.10 1.24 1.20  --  1.14  CALCIUM 9.2 9.6 9.2 9.5  --  9.6  MG  --   --   --   --  1.9  --    Liver Function Tests: No results for input(s): AST, ALT, ALKPHOS, BILITOT, PROT, ALBUMIN in the last 168 hours. No results for input(s): LIPASE, AMYLASE in the last 168 hours. No results for input(s): AMMONIA in the  last 168 hours. CBC:  Recent Labs Lab 01/05/17 0917 01/06/17 0329 01/07/17 0828 01/08/17 1052 01/09/17 0901  WBC 11.5* 13.1* 12.2* 12.2* 12.6*  HGB 12.1* 13.0 12.5* 12.5* 12.8*  HCT 39.2 40.9 39.9 39.9 40.9  MCV 67.7* 67.6* 67.7* 67.4* 68.1*  PLT 149* 137* 142* 190 195   Cardiac Enzymes: No results for input(s): CKTOTAL, CKMB, CKMBINDEX, TROPONINI in the last 168 hours. BNP: Invalid input(s): POCBNP CBG:  Recent Labs Lab 01/08/17 1624 01/08/17 1950 01/08/17 2350 01/09/17 0412 01/09/17 0900  GLUCAP 230* 218* 208* 215* 236*   D-Dimer No results for input(s): DDIMER in the last 72 hours. Hgb A1c No results for input(s): HGBA1C in the last 72 hours. Lipid Profile No results for input(s): CHOL, HDL, LDLCALC, TRIG, CHOLHDL, LDLDIRECT in the last 72 hours. Thyroid function studies No results for input(s): TSH, T4TOTAL, T3FREE, THYROIDAB in the last 72 hours.  Invalid input(s): FREET3 Anemia work  up No results for input(s): VITAMINB12, FOLATE, FERRITIN, TIBC, IRON, RETICCTPCT in the last 72 hours. Urinalysis    Component Value Date/Time   COLORURINE ORANGE (A) 01/05/2017 2024   APPEARANCEUR CLOUDY (A) 01/05/2017 2024   LABSPEC 1.010 01/05/2017 2024   PHURINE 7.0 01/05/2017 2024   GLUCOSEU NEGATIVE 01/05/2017 2024   HGBUR LARGE (A) 01/05/2017 2024   BILIRUBINUR NEGATIVE 01/05/2017 2024   KETONESUR 40 (A) 01/05/2017 2024   PROTEINUR 30 (A) 01/05/2017 2024   UROBILINOGEN 1.0 12/06/2014 1414   NITRITE NEGATIVE 01/05/2017 2024   LEUKOCYTESUR TRACE (A) 01/05/2017 2024   Sepsis Labs Invalid input(s): PROCALCITONIN,  WBC,  LACTICIDVEN Microbiology Recent Results (from the past 240 hour(s))  Urine culture     Status: None   Collection Time: 01/01/17  5:23 PM  Result Value Ref Range Status   Specimen Description URINE, RANDOM  Final   Special Requests ADDED [id number] [phone]  Final   Culture NO GROWTH  Final   Report Status 01/03/2017 FINAL  Final  MRSA PCR Screening      Status: Abnormal   Collection Time: 01/02/17  2:42 PM  Result Value Ref Range Status   MRSA by PCR POSITIVE (A) NEGATIVE Final    Comment:        The GeneXpert MRSA Assay (FDA approved for NASAL specimens only), is one component of a comprehensive MRSA colonization surveillance program. It is not intended to diagnose MRSA infection nor to guide or monitor treatment for MRSA infections. RESULT CALLED TO, READ BACK BY AND VERIFIED WITH: D.EVERETTE,RN AT 1932 BY L.PITT 01/02/17   Culture, blood (routine x 2)     Status: None   Collection Time: 01/03/17  8:35 AM  Result Value Ref Range Status   Specimen Description BLOOD LEFT ARM  Final   Special Requests BOTTLES DRAWN AEROBIC AND ANAEROBIC 5CC EA  Final   Culture NO GROWTH 5 DAYS  Final   Report Status 01/08/2017 FINAL  Final  Culture, blood (routine x 2)     Status: None   Collection Time: 01/03/17  8:38 AM  Result Value Ref Range Status   Specimen Description BLOOD LEFT ARM  Final   Special Requests IN PEDIATRIC BOTTLE 2CC  Final   Culture NO GROWTH 5 DAYS  Final   Report Status 01/08/2017 FINAL  Final  C difficile quick scan w PCR reflex     Status: Abnormal   Collection Time: 01/04/17 11:20 AM  Result Value Ref Range Status   C Diff antigen POSITIVE (A) NEGATIVE Final   C Diff toxin POSITIVE (A) NEGATIVE Final   C Diff interpretation Toxin producing C. difficile detected.  Final    Comment: CRITICAL RESULT CALLED TO, READ BACK BY AND VERIFIED WITH: J.COVINGTON RN AT 1249 01/04/17 BY A.DAVIS      Time coordinating discharge: Over 30 minutes  SIGNED:   Elmarie Shiley, MD  [facility] 01/09/2017, 10:36 AM Pager   If 7PM-7AM, please contact night-coverage [email or web address] Password [email or web address]

## 2017-01-10 ENCOUNTER — Encounter: Payer: Self-pay | Admitting: Adult Health

## 2017-01-10 ENCOUNTER — Non-Acute Institutional Stay (SKILLED_NURSING_FACILITY): Payer: Medicare Other | Admitting: Adult Health

## 2017-01-10 DIAGNOSIS — I639 Cerebral infarction, unspecified: Secondary | ICD-10-CM

## 2017-01-10 DIAGNOSIS — K219 Gastro-esophageal reflux disease without esophagitis: Secondary | ICD-10-CM

## 2017-01-10 DIAGNOSIS — G9340 Encephalopathy, unspecified: Secondary | ICD-10-CM | POA: Diagnosis not present

## 2017-01-10 DIAGNOSIS — I5032 Chronic diastolic (congestive) heart failure: Secondary | ICD-10-CM | POA: Diagnosis not present

## 2017-01-10 DIAGNOSIS — B351 Tinea unguium: Secondary | ICD-10-CM

## 2017-01-10 DIAGNOSIS — F339 Major depressive disorder, recurrent, unspecified: Secondary | ICD-10-CM

## 2017-01-10 DIAGNOSIS — E785 Hyperlipidemia, unspecified: Secondary | ICD-10-CM

## 2017-01-10 DIAGNOSIS — J9611 Chronic respiratory failure with hypoxia: Secondary | ICD-10-CM

## 2017-01-10 DIAGNOSIS — J309 Allergic rhinitis, unspecified: Secondary | ICD-10-CM | POA: Diagnosis not present

## 2017-01-10 DIAGNOSIS — R531 Weakness: Secondary | ICD-10-CM | POA: Diagnosis not present

## 2017-01-10 DIAGNOSIS — I10 Essential (primary) hypertension: Secondary | ICD-10-CM

## 2017-01-10 DIAGNOSIS — E876 Hypokalemia: Secondary | ICD-10-CM

## 2017-01-10 DIAGNOSIS — E1142 Type 2 diabetes mellitus with diabetic polyneuropathy: Secondary | ICD-10-CM

## 2017-01-10 DIAGNOSIS — L03119 Cellulitis of unspecified part of limb: Secondary | ICD-10-CM

## 2017-01-10 DIAGNOSIS — G629 Polyneuropathy, unspecified: Secondary | ICD-10-CM | POA: Diagnosis not present

## 2017-01-10 DIAGNOSIS — N179 Acute kidney failure, unspecified: Secondary | ICD-10-CM

## 2017-01-10 DIAGNOSIS — A0472 Enterocolitis due to Clostridium difficile, not specified as recurrent: Secondary | ICD-10-CM

## 2017-01-10 DIAGNOSIS — N400 Enlarged prostate without lower urinary tract symptoms: Secondary | ICD-10-CM

## 2017-01-10 DIAGNOSIS — N183 Chronic kidney disease, stage 3 (moderate): Secondary | ICD-10-CM

## 2017-01-10 DIAGNOSIS — I480 Paroxysmal atrial fibrillation: Secondary | ICD-10-CM

## 2017-01-10 LAB — GLUCOSE, CAPILLARY: Glucose-Capillary: 237 mg/dL — ABNORMAL HIGH (ref 65–99)

## 2017-01-10 NOTE — Progress Notes (Signed)
DATE:  01/10/2017   MRN:  [id number]  BIRTHDAY: [birth year]  Facility:  [facility] Home Location:  [place] Room Number: [id number]  LEVEL OF CARE:  SNF (31)  Contact Information    Name Relation Home Work Mobile   [place] Son [phone]  [phone]   Philemon,[name] Relative [phone]         Code Status History    Date Active Date Inactive Code Status Order ID Comments User Context   01/02/2017  2:31 PM 01/10/2017 12:10 AM DNR [id number]  Germain Osgood, PA-C Inpatient   01/01/2017  8:54 PM 01/02/2017  2:31 PM Full Code [id number]  Eugenie Filler, MD Inpatient   12/05/2016 10:29 PM 12/15/2016  4:33 PM Full Code [id number]  Norval Morton, MD ED   03/14/2015 11:49 PM 03/17/2015  7:46 PM Full Code [id number]  Jani Gravel, MD Inpatient   01/11/2014  2:49 PM 01/13/2014  4:19 PM DNR [id number]  Annita Brod, MD Inpatient   04/04/2013  5:21 PM 04/08/2013  6:46 PM Full Code [id number]  Orson Eva, MD Inpatient   12/18/2011  1:41 PM 12/22/2011  2:29 PM Full Code [id number]  Courtney Heys, RN Inpatient   12/14/2011  1:41 AM 12/18/2011  1:41 PM Full Code [id number]  Malva Limes, RN Inpatient    Questions for Most Recent Historical Code Status (Order [id number])    Question Answer Comment   In the event of cardiac or respiratory ARREST Do not call a "code blue"    In the event of cardiac or respiratory ARREST Do not perform Intubation, CPR, defibrillation or ACLS    In the event of cardiac or respiratory ARREST Use medication by any route, position, wound care, and other measures to relive pain and suffering. May use oxygen, suction and manual treatment of airway obstruction as needed for comfort.         Advance Directive Documentation   Flowsheet Row Most Recent Value  Type of Advance Directive  Out of facility DNR (pink MOST or yellow form)  Pre-existing out of facility DNR order (yellow form or pink MOST form)  No data  "MOST" Form in Place?  No data        Chief Complaint  Patient presents with  . Hospitalization Follow-up    HISTORY OF PRESENT ILLNESS:  This is an 89-YO male seen for hospital followup.  He was re-admitted to [facility] for short-term rehabilitation on 01/09/2017 following an admission at [facility] 01/01/2017-01/09/2017 for a 4-day history of slurred speech and progressive confusion. MRI showed stroke. Neurology was consulted and does not think that MRI finding explain patient mental status. MS is thought to be related delirium, infection. He had multiple BMs and tested positive for C-Difficile. He was started on Vancomycin. He was being given cefepime for lower extremity cellulitis. Cefepime was discontinued. LE redness Improving.  He has been admitted for a short-term rehabilitation.   He was seen in the room and did not verbalize any concerns.  PAST MEDICAL HISTORY:  Past Medical History:  Diagnosis Date  . Alpha thalassemia ([place])   . Anemia 01/01/2017  . BPH (benign prostatic hyperplasia)   . CHF (congestive heart failure) ([place])   . High cholesterol   . Hypertension   . Lower extremity edema   . Onychomycosis   . Prostate cancer ([place])    S/P "8 weeks of radiation"  . Prostatitis    recurrent  .  Sleep apnea   . Stroke [facility]) ~ 2005   denies residual on 2/123/2015  . Type II diabetes mellitus ([place])   . Umbilical hernia   . Urinary urgency    with incontinence     CURRENT MEDICATIONS: Reviewed  Patient's Medications  New Prescriptions   No medications on file  Previous Medications   AMITRIPTYLINE (ELAVIL) 25 MG TABLET    Take 25 mg by mouth at bedtime.   ASPIRIN EC 81 MG TABLET    Take 81 mg by mouth daily.   B COMPLEX VITAMINS (VITAMIN B COMPLEX PO)    Take 1 tablet by mouth daily.   CLOPIDOGREL (PLAVIX) 75 MG TABLET    Take 75 mg by mouth daily.    CLOTRIMAZOLE-BETAMETHASONE (LOTRISONE) LOTION    Apply 1 application topically 2 (two) times daily as needed (antifungal).    FLUTICASONE  (FLONASE) 50 MCG/ACT NASAL SPRAY    Place 1 spray into both nostrils daily.   FUROSEMIDE (LASIX) 40 MG TABLET    Take 40-80 mg by mouth daily. Take 80 mg QAM and 40 mg QPM for CHF   GABAPENTIN (NEURONTIN) 100 MG CAPSULE    Take 1 capsule (100 mg total) by mouth 3 (three) times daily.   HYDROCERIN (EUCERIN) CREA    Apply 1 application topically daily.   INSULIN GLARGINE (LANTUS) 100 UNIT/ML INJECTION    Inject 0.2 mLs (20 Units total) into the skin daily.   METOPROLOL TARTRATE (LOPRESSOR) 25 MG TABLET    Take 1 tablet (25 mg total) by mouth 2 (two) times daily.   NEOMYCIN-BACITRACIN-POLYMYXIN (NEOSPORIN) OINT    Apply 1 application topically 3 (three) times daily. Apply to legs   PANTOPRAZOLE (PROTONIX) 40 MG TABLET    Take 40 mg by mouth daily.   POTASSIUM CHLORIDE SA (K-DUR,KLOR-CON) 20 MEQ TABLET    Take 20 mEq by mouth 2 (two) times daily.    SIMVASTATIN (ZOCOR) 20 MG TABLET    Take 20 mg by mouth daily.   TERAZOSIN (HYTRIN) 2 MG CAPSULE    Take 4 mg by mouth daily. Take two 2-mg tablets to = 4 mg   TERBINAFINE (LAMISIL) 250 MG TABLET    Take 250 mg by mouth daily. continous   VANCOMYCIN (VANCOCIN) 50 MG/ML ORAL SOLUTION    Take 2.5 mLs (125 mg total) by mouth 4 (four) times daily.   VITAMIN B-12 (CYANOCOBALAMIN) 1000 MCG TABLET    Take 1,000 mcg by mouth daily.  Modified Medications   No medications on file  Discontinued Medications   No medications on file     Allergies  Allergen Reactions  . Ativan [Lorazepam] Other (See Comments)    Flailing of extremities noted immediately s/p IV administration.  [name] Kitchen Flomax [Tamsulosin Hcl] Other (See Comments)    Excessive tearing  . Glucophage [Metformin Hcl] Diarrhea     REVIEW OF SYSTEMS:  GENERAL: no change in appetite, no fatigue, no weight changes, no fever, chills or weakness EYES: Denies change in vision, dry eyes, eye pain, itching or discharge EARS: Denies change in hearing, ringing in ears, or earache NOSE: Denies nasal  congestion or epistaxis MOUTH and THROAT: Denies oral discomfort, gingival pain or bleeding, pain from teeth or hoarseness   RESPIRATORY: no cough, SOB, DOE, wheezing, hemoptysis CARDIAC: no chest pain, or palpitations GI: no abdominal pain, diarrhea, constipation, heart burn, nausea or vomiting GU: Denies dysuria, frequency, hematuria, incontinence, or discharge PSYCHIATRIC: Denies feeling of depression or anxiety. No report of hallucinations,  insomnia, paranoia, or agitation     PHYSICAL EXAMINATION  GENERAL APPEARANCE: Well nourished. In no acute distress. Obese SKIN:  Bilateral lower extremity noted with redness HEAD: Normal in size and contour. No evidence of trauma EYES: Lids open and close normally. No blepharitis, entropion or ectropion. PERRL. Conjunctivae are clear and sclerae are white. Lenses are without opacity EARS: Pinnae are normal. Patient hears normal voice tunes of the examiner MOUTH and THROAT: Lips are without lesions. Oral mucosa is moist and without lesions. Tongue is normal in shape, size, and color and without lesions NECK: supple, trachea midline, no neck masses, no thyroid tenderness, no thyromegaly LYMPHATICS: no LAN in the neck, no supraclavicular LAN RESPIRATORY: breathing is even & unlabored, BS CTAB, O2 @ 2L/min via [place] CARDIAC: RRR, no murmur,no extra heart sounds, BLE 1+ edema GI: abdomen soft, normal BS, no masses, no tenderness, no hepatomegaly, no splenomegaly EXTREMITIES:  Able to move X 4 extremities, left-sided weakness NEURO:  Slurred speech PSYCHIATRIC: Alert to self, disoriented to time and place. Affect and behavior are appropriate   LABS/RADIOLOGY: Labs reviewed: Basic Metabolic Panel:  Recent Labs  01/07/17 0828 01/08/17 1052 01/08/17 1842 01/09/17 0901  NA 139 140  --  140  K 3.5 3.4* 3.6 3.8  CL 96* 97*  --  97*  CO2 30 31  --  32  GLUCOSE 193* 324*  --  310*  BUN 16 17  --  15  CREATININE 1.24 1.20  --  1.14  CALCIUM 9.2  9.5  --  9.6  MG  --   --  1.9  --    Liver Function Tests:  Recent Labs  12/09/16 0528 01/01/17 1507  AST 20 24  ALT 21 19  ALKPHOS 68 68  BILITOT 0.4 0.7  PROT 6.7 5.9*  ALBUMIN 3.0* 2.9*    Recent Labs  01/01/17 1507  AMMONIA 19   CBC:  Recent Labs  12/05/16 1741  01/01/17 1507  01/07/17 0828 01/08/17 1052 01/09/17 0901  WBC 13.5*  < > 9.5  < > 12.2* 12.2* 12.6*  NEUTROABS 10.5*  --  6.7  --   --   --   --   HGB 11.4*  < > 10.6*  < > 12.5* 12.5* 12.8*  HCT 36.1*  < > 35.6*  < > 39.9 39.9 40.9  MCV 68.8*  < > 69.4*  < > 67.7* 67.4* 68.1*  PLT 240  < > 164  < > 142* 190 195  < > = values in this interval not displayed.  Lipid Panel:  Recent Labs  01/02/17 0237  HDL 28*   CBG:  Recent Labs  01/09/17 1115 01/09/17 1622 01/09/17 2044  GLUCAP 254* 237* 230*      Ct Angio Head W Or Wo Contrast  Result Date: 01/03/2017 CLINICAL DATA:  Stroke EXAM: CT ANGIOGRAPHY HEAD AND NECK TECHNIQUE: Multidetector CT imaging of the head and neck was performed using the standard protocol during bolus administration of intravenous contrast. Multiplanar CT image reconstructions and MIPs were obtained to evaluate the vascular anatomy. Carotid stenosis measurements (when applicable) are obtained utilizing NASCET criteria, using the distal internal carotid diameter as the denominator. COMPARISON:  Brain MRI 01/01/2017 FINDINGS: CT HEAD FINDINGS Brain: No mass lesion, intraparenchymal hemorrhage or extra-axial collection. No evidence of acute cortical infarct. There is periventricular hypoattenuation compatible with chronic microvascular disease. Old right cerebellar infarct. Skull: Normal visualized skull base, calvarium and extracranial soft tissues. Sinuses/Orbits: No sinus fluid levels  or advanced mucosal thickening. No mastoid effusion. Normal orbits. CTA NECK FINDINGS Aortic arch: There is no aneurysm or dissection of the visualized ascending aorta or aortic arch. There is a  normal 3 vessel branching pattern. The visualized proximal subclavian arteries are normal. There is atherosclerotic calcification within the aortic arch. Right carotid system: The right common carotid origin is widely patent. There is no common carotid or internal carotid artery dissection or aneurysm. No hemodynamically significant stenosis. There is mild atherosclerotic calcification at the right carotid bifurcation. Left carotid system: The left common carotid origin is widely patent. There is no common carotid or internal carotid artery dissection or aneurysm. No hemodynamically significant stenosis. Vertebral arteries: The vertebral system is left dominant. There is atherosclerotic calcification of the left vertebral artery origin, which remains widely patent. Both vertebral arteries are normal to their confluence with the basilar artery. Skeleton: There is no bony spinal canal stenosis. No lytic or blastic lesions. Other neck: The nasopharynx is clear. The oropharynx and hypopharynx are normal. The epiglottis is normal. The supraglottic larynx, glottis and subglottic larynx are normal. No retropharyngeal collection. The parapharyngeal spaces are preserved. The parotid and submandibular glands are normal. No sialolithiasis or salivary ductal dilatation. The thyroid gland is normal. There is no cervical lymphadenopathy. Upper chest: No pneumothorax or pleural effusion. No nodules or masses. Lingular atelectasis. Review of the MIP images confirms the above findings CTA HEAD FINDINGS Anterior circulation: --Intracranial internal carotid arteries: There is atherosclerotic calcification of the right cavernous and left cavernous and clinoid segments without hemodynamically significant stenosis. --Anterior cerebral arteries: Normal. --Middle cerebral arteries: Normal. --Posterior communicating arteries: Absent bilaterally. Posterior circulation: --Posterior cerebral arteries: Normal. --Superior cerebellar arteries:  Normal. --Basilar artery: Normal. --Anterior inferior cerebellar arteries: Normal. --Posterior inferior cerebellar arteries: Normal. Venous sinuses: As permitted by contrast timing, patent. Anatomic variants: None Delayed phase: No parenchymal contrast enhancement. Review of the MIP images confirms the above findings IMPRESSION: 1. No CT evidence of acute intracranial abnormality. The small infarct seen on the prior MRI is not visible on this study. 2. No occlusion or hemodynamically significant stenosis of the intracranial or cervical arteries. 3. Aortic atherosclerosis. Electronically Signed   By: Ulyses Jarred M.D.   On: 01/03/2017 13:44   Dg Chest 2 View  Result Date: 01/01/2017 CLINICAL DATA:  Altered mental status EXAM: CHEST  2 VIEW COMPARISON:  12/10/2016 FINDINGS: Cardiomegaly again noted. Stable chronic elevation of left hemidiaphragm and prominent fat pad in left cardiophrenic angle. There is linear atelectasis or infiltrate in lingula. No pulmonary edema. Right lung is clear IMPRESSION: Stable chronic elevation of left hemidiaphragm and prominent fat pad in left cardiophrenic angle. There is linear atelectasis or infiltrate in lingula. No pulmonary edema. Right lung is clear Electronically Signed   By: Lahoma Crocker M.D.   On: 01/01/2017 16:25   Ct Head Wo Contrast  Result Date: 01/01/2017 CLINICAL DATA:  Pt has very altered mental status talking in undistinguished words and wavings arms around EXAM: CT HEAD WITHOUT CONTRAST CT CERVICAL SPINE WITHOUT CONTRAST TECHNIQUE: Multidetector CT imaging of the head and cervical spine was performed following the standard protocol without intravenous contrast. Multiplanar CT image reconstructions of the cervical spine were also generated. COMPARISON:  03/14/2015 and 03/15/2015 FINDINGS: CT HEAD FINDINGS Brain: There is moderate central and cortical atrophy. Periventricular white matter changes are consistent with small vessel disease. Stable appearance of  subdural hygroma in the posterior fossa. Remote right cerebellar infarct. There is no intra or extra-axial mass  lesion. The basilar cisterns and ventricles have a normal appearance. There is no CT evidence for acute infarction or hemorrhage. Vascular: There is atherosclerotic calcification of the carotid siphons. Skull: No calvarial fracture. Sinuses/Orbits: No acute finding. Other: None CT CERVICAL SPINE FINDINGS Alignment: There is significant degenerative changes in mid and lower cervical spine. Degenerative anterolisthesis of C5 on C6 measures approximately 4 mm. Skull base and vertebrae: No acute fracture. No primary bone lesion or focal pathologic process. Soft tissues and spinal canal: No prevertebral fluid or swelling. No visible canal hematoma. Disc levels: Significant disc height loss throughout the cervical spine. Bilateral foraminal narrowing at C3-4. Bilateral foraminal narrowing at C4-5. Bilateral foraminal narrowing at C6-7. Significant facet hypertrophy at all levels. Upper chest: Unremarkable. Other: Study quality is degraded by patient motion artifact. IMPRESSION: 1. Atrophy and small vessel disease. 2. 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 3. Remote lacunar infarct of the right cerebellar hemisphere. 4.  No evidence for acute CT abnormality. 5. Significant cervical spine degenerative changes without evidence for acute abnormality. Electronically Signed   By: Nolon Nations M.D.   On: 01/01/2017 16:10   Ct Angio Neck W Or Wo Contrast  Result Date: 01/03/2017 CLINICAL DATA:  Stroke EXAM: CT ANGIOGRAPHY HEAD AND NECK TECHNIQUE: Multidetector CT imaging of the head and neck was performed using the standard protocol during bolus administration of intravenous contrast. Multiplanar CT image reconstructions and MIPs were obtained to evaluate the vascular anatomy. Carotid stenosis measurements (when applicable) are obtained utilizing NASCET criteria, using the distal internal carotid  diameter as the denominator. COMPARISON:  Brain MRI 01/01/2017 FINDINGS: CT HEAD FINDINGS Brain: No mass lesion, intraparenchymal hemorrhage or extra-axial collection. No evidence of acute cortical infarct. There is periventricular hypoattenuation compatible with chronic microvascular disease. Old right cerebellar infarct. Skull: Normal visualized skull base, calvarium and extracranial soft tissues. Sinuses/Orbits: No sinus fluid levels or advanced mucosal thickening. No mastoid effusion. Normal orbits. CTA NECK FINDINGS Aortic arch: There is no aneurysm or dissection of the visualized ascending aorta or aortic arch. There is a normal 3 vessel branching pattern. The visualized proximal subclavian arteries are normal. There is atherosclerotic calcification within the aortic arch. Right carotid system: The right common carotid origin is widely patent. There is no common carotid or internal carotid artery dissection or aneurysm. No hemodynamically significant stenosis. There is mild atherosclerotic calcification at the right carotid bifurcation. Left carotid system: The left common carotid origin is widely patent. There is no common carotid or internal carotid artery dissection or aneurysm. No hemodynamically significant stenosis. Vertebral arteries: The vertebral system is left dominant. There is atherosclerotic calcification of the left vertebral artery origin, which remains widely patent. Both vertebral arteries are normal to their confluence with the basilar artery. Skeleton: There is no bony spinal canal stenosis. No lytic or blastic lesions. Other neck: The nasopharynx is clear. The oropharynx and hypopharynx are normal. The epiglottis is normal. The supraglottic larynx, glottis and subglottic larynx are normal. No retropharyngeal collection. The parapharyngeal spaces are preserved. The parotid and submandibular glands are normal. No sialolithiasis or salivary ductal dilatation. The thyroid gland is normal. There  is no cervical lymphadenopathy. Upper chest: No pneumothorax or pleural effusion. No nodules or masses. Lingular atelectasis. Review of the MIP images confirms the above findings CTA HEAD FINDINGS Anterior circulation: --Intracranial internal carotid arteries: There is atherosclerotic calcification of the right cavernous and left cavernous and clinoid segments without hemodynamically significant stenosis. --Anterior cerebral arteries: Normal. --Middle cerebral arteries: Normal. --Posterior  communicating arteries: Absent bilaterally. Posterior circulation: --Posterior cerebral arteries: Normal. --Superior cerebellar arteries: Normal. --Basilar artery: Normal. --Anterior inferior cerebellar arteries: Normal. --Posterior inferior cerebellar arteries: Normal. Venous sinuses: As permitted by contrast timing, patent. Anatomic variants: None Delayed phase: No parenchymal contrast enhancement. Review of the MIP images confirms the above findings IMPRESSION: 1. No CT evidence of acute intracranial abnormality. The small infarct seen on the prior MRI is not visible on this study. 2. No occlusion or hemodynamically significant stenosis of the intracranial or cervical arteries. 3. Aortic atherosclerosis. Electronically Signed   By: Ulyses Jarred M.D.   On: 01/03/2017 13:44   Ct Cervical Spine Wo Contrast  Result Date: 01/01/2017 CLINICAL DATA:  Pt has very altered mental status talking in undistinguished words and wavings arms around EXAM: CT HEAD WITHOUT CONTRAST CT CERVICAL SPINE WITHOUT CONTRAST TECHNIQUE: Multidetector CT imaging of the head and cervical spine was performed following the standard protocol without intravenous contrast. Multiplanar CT image reconstructions of the cervical spine were also generated. COMPARISON:  03/14/2015 and 03/15/2015 FINDINGS: CT HEAD FINDINGS Brain: There is moderate central and cortical atrophy. Periventricular white matter changes are consistent with small vessel disease. Stable  appearance of subdural hygroma in the posterior fossa. Remote right cerebellar infarct. There is no intra or extra-axial mass lesion. The basilar cisterns and ventricles have a normal appearance. There is no CT evidence for acute infarction or hemorrhage. Vascular: There is atherosclerotic calcification of the carotid siphons. Skull: No calvarial fracture. Sinuses/Orbits: No acute finding. Other: None CT CERVICAL SPINE FINDINGS Alignment: There is significant degenerative changes in mid and lower cervical spine. Degenerative anterolisthesis of C5 on C6 measures approximately 4 mm. Skull base and vertebrae: No acute fracture. No primary bone lesion or focal pathologic process. Soft tissues and spinal canal: No prevertebral fluid or swelling. No visible canal hematoma. Disc levels: Significant disc height loss throughout the cervical spine. Bilateral foraminal narrowing at C3-4. Bilateral foraminal narrowing at C4-5. Bilateral foraminal narrowing at C6-7. Significant facet hypertrophy at all levels. Upper chest: Unremarkable. Other: Study quality is degraded by patient motion artifact. IMPRESSION: 1. Atrophy and small vessel disease. 2. 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 3. Remote lacunar infarct of the right cerebellar hemisphere. 4.  No evidence for acute CT abnormality. 5. Significant cervical spine degenerative changes without evidence for acute abnormality. Electronically Signed   By: Nolon Nations M.D.   On: 01/01/2017 16:10   Mr Brain Wo Contrast  Result Date: 01/01/2017 CLINICAL DATA:  Initial evaluation for acute slurred speech, progressive confusion. EXAM: MRI HEAD WITHOUT CONTRAST TECHNIQUE: Multiplanar, multiecho pulse sequences of the brain and surrounding structures were obtained without intravenous contrast. COMPARISON:  Prior CT from earlier the same day. FINDINGS: Brain: The study is limited as the patient was unable to tolerate the full length of the exam. Additionally, images  provided are moderately degraded by motion artifact. Diffuse prominence of the CSF containing spaces is compatible with generalized age-related cerebral atrophy. Patchy T2/FLAIR hyperintensity within the periventricular and deep white matter both cerebral hemispheres most consistent with chronic microvascular ischemic disease, fairly advanced in nature. Chronic microvascular ischemic changes present within the pons. Remote bilateral cerebellar infarcts noted. Additional focus of cortical encephalomalacia within the right parietal lobe compatible with remote ischemic infarct as well. There is a punctate 5-6 mm focus of diffusion abnormality within the right periatrial white matter (series 3, image 31), suspicious for a small acute/ early subacute small vessel ischemic infarct. No associated mass effect. No  definite associated hemorrhage, although evaluation for blood products limited on this exam. No other abnormal foci of restricted diffusion to suggest acute or subacute ischemia. Gray-white matter differentiation otherwise maintained. No mass lesion, midline shift, or mass effect. Ventricular prominence related to global parenchymal volume loss without hydrocephalus. No extra-axial fluid collection. Major dural sinuses are grossly patent. Vascular: Major intracranial vascular flow voids are maintained. Right vertebral artery hypoplastic. Skull and upper cervical spine: Craniocervical junction grossly unremarkable, although not well evaluated on this limited exam. Scalp soft tissues unremarkable. Calvarium intact. Bone marrow signal intensity grossly normal. Sinuses/Orbits: Globes and orbital soft tissues within normal limits. Paranasal sinuses are largely clear. Small bilateral mastoid effusions, left greater than right. Inner ear structures grossly normal. IMPRESSION: 1. Limited study due to patient's inability to tolerate the full length of the exam and motion artifact. 2. 5-6 mm focus of diffusion abnormality  within the right periatrial white matter, suspicious for a small acute/early subacute small vessel ischemic infarct. 3. No other acute intracranial process identified. 4. Advanced cerebral atrophy with chronic microvascular ischemic disease and scattered remote ischemic infarcts as above. Electronically Signed   By: Jeannine Boga M.D.   On: 01/01/2017 23:17   Dg Abd Portable 1v  Result Date: 01/07/2017 CLINICAL DATA:  Abdominal pain EXAM: PORTABLE ABDOMEN - 1 VIEW COMPARISON:  CT abdomen and pelvis Apr 04, 2013 FINDINGS: There is no bowel dilatation or air-fluid level suggesting bowel obstruction. No free air. There is aortic atherosclerosis. IMPRESSION: No bowel obstruction or free air evident.  Aortic atherosclerosis. Electronically Signed   By: Lowella Grip III M.D.   On: 01/07/2017 13:23    ASSESSMENT/PLAN:  Generalized weakness - for rehabilitation, PT and OT, for therapeutic strengthening exercises; fall precautions  Acute encephalopathy - MRI showed stroke, neurology does not think that MRI finding explain patient mental status; encephalopathy was thought to be from delirium, infection. Stool culture was positive for c-difficile and was started on Vancomycin  C- Difficile colitis -  Continue Vancomycin X 8 more days  Chronic respiratory failure with hypoxia - continue O2 at 2 L/minute via [place]  Chronic diastolic heart failure - continue Lasix  80 mg Q AM and 40 mg 1 tab by mouth Q HS; weigh Q M-W-F  Lower extremity cellulitis - cefepime and doxycycline were discontinued; lower extremity redness improving; will monitor  Diabetes mellitus, type II - continue Lantus 100 units/mL give 20 units subcutaneous daily; CBG twice a day Lab Results  Component Value Date   HGBA1C 8.6 (H) 01/02/2017   Hypertension - continue metoprolol titrate 25 mg 1 tab by mouth twice a day  Hypokalemia - continue Klor-Con 20 meq 1 tab by mouth twice a day Lab Results  Component Value Date   K 3.8  01/09/2017   Neuropathy - continue gabapentin 100 mg 1 capsule by mouth 3 times a day  Stroke - continue Plavix 75 mg 1 tab by mouth daily and aspirin EC 81 mg 1 tab by mouth daily  Allergic rhinitis - tinea Flonase 50 g 1 spray into both nostrils daily  Hyperlipidemia - continue simvastatin 20 mg 1 tab by mouth daily Lab Results  Component Value Date   CHOL 130 01/02/2017   HDL 28 (L) 01/02/2017   LDLCALC 76 01/02/2017   TRIG 130 01/02/2017   CHOLHDL 4.6 01/02/2017   BPH - continue Terazosin 2 mg give 2 capsules = 4 mg by mouth daily  Onychomycosis - continue terbinafine 250 mg 1 tab by  mouth daily  Depression - mood is stable; continue amitriptyline 25 mg 1 tab by mouth daily at bedtime  GERD - continue pantoprazole 40 mg 1 tab by mouth daily  Acute on chronic kidney disease, stage III - creatinine peaked to 1.7, IV fluids given; will monitor Lab Results  Component Value Date   CREATININE 1.14 01/09/2017   PAF - rate controlled; continue metoprolol    Goals of care:  Short-term rehabilitation   Diar Berkel C. [place] - NP   [employer] [phone]

## 2017-01-12 ENCOUNTER — Other Ambulatory Visit: Payer: Self-pay | Admitting: *Deleted

## 2017-01-12 NOTE — Patient Outreach (Signed)
[facility]) Care Management  [facility] Social Work  01/12/2017  RICKARDO WISHARD 1931-08-31 [id number]  Subjective:   Patient is a 83 year male currently in rehab at [facility].  Objective:   Encounter Medications:  Outpatient Encounter Prescriptions as of 01/12/2017  Medication Sig  . amitriptyline (ELAVIL) 25 MG tablet Take 25 mg by mouth at bedtime.  [name] Kitchen aspirin EC 81 MG tablet Take 81 mg by mouth daily.  . B Complex Vitamins (VITAMIN B COMPLEX PO) Take 1 tablet by mouth daily.  . clopidogrel (PLAVIX) 75 MG tablet Take 75 mg by mouth daily.   . clotrimazole-betamethasone (LOTRISONE) lotion Apply 1 application topically 2 (two) times daily as needed (antifungal).   . fluticasone (FLONASE) 50 MCG/ACT nasal spray Place 1 spray into both nostrils daily.  . furosemide (LASIX) 40 MG tablet Take 40-80 mg by mouth daily. Take 80 mg QAM and 40 mg QPM for CHF  . gabapentin (NEURONTIN) 100 MG capsule Take 1 capsule (100 mg total) by mouth 3 (three) times daily.  . hydrocerin (EUCERIN) CREA Apply 1 application topically daily.  . insulin glargine (LANTUS) 100 UNIT/ML injection Inject 0.2 mLs (20 Units total) into the skin daily.  . metoprolol tartrate (LOPRESSOR) 25 MG tablet Take 1 tablet (25 mg total) by mouth 2 (two) times daily.  [name] Kitchen neomycin-bacitracin-polymyxin (NEOSPORIN) OINT Apply 1 application topically 3 (three) times daily. Apply to legs  . pantoprazole (PROTONIX) 40 MG tablet Take 40 mg by mouth daily.  . potassium chloride SA (K-DUR,KLOR-CON) 20 MEQ tablet Take 20 mEq by mouth 2 (two) times daily.   . simvastatin (ZOCOR) 20 MG tablet Take 20 mg by mouth daily.  [name] Kitchen terazosin (HYTRIN) 2 MG capsule Take 4 mg by mouth daily. Take two 2-mg tablets to = 4 mg  . terbinafine (LAMISIL) 250 MG tablet Take 250 mg by mouth daily. continous  . vancomycin (VANCOCIN) 50 mg/mL oral solution Take 2.5 mLs (125 mg total) by mouth 4 (four) times daily.  . vitamin B-12  (CYANOCOBALAMIN) 1000 MCG tablet Take 1,000 mcg by mouth daily.   No facility-administered encounter medications on file as of 01/12/2017.     Functional Status:  In your present state of health, do you have any difficulty performing the following activities: 01/01/2017 12/06/2016  Hearing? - Y  Vision? - N  Difficulty concentrating or making decisions? Y N  Walking or climbing stairs? - Y  Dressing or bathing? Y Y  Doing errands, shopping? - Y  Preparing Food and eating ? - -  Using the Toilet? - -  In the past six months, have you accidently leaked urine? - -  Do you have problems with loss of bowel control? - -  Managing your Medications? - -  Managing your Finances? - -  Housekeeping or managing your Housekeeping? - -  Some recent data might be hidden    Fall/Depression Screening:  PHQ 2/9 Scores 11/02/2016 10/19/2016  PHQ - 2 Score 0 0    Assessment:  SNF visit to see patient. Patient re-admitted to [facility] on 01/09/17 following a hospital admission at [facility].  Patient has tested positive for C-Diff and is on precautions., Patient was not available to visit. When this social worker arrived, staff was in his room helping him to get cleaned up.  This social worker was able to speak to discharge [place] regarding patient's current status   Per [name] patient alert and verbal is able  to voice simple needs. Per discharge planner, ptient reports being in no pain.  Patient participating on PT and OT. Current plan is for patient to discharge home with [facility]. No discharge date set yet.   Plan:  This social worker to continue to follow up with patient and discharge planner regarding patient's long term care needs.   [name] [facility] Care Management [phone]

## 2017-01-15 DIAGNOSIS — G8929 Other chronic pain: Secondary | ICD-10-CM | POA: Diagnosis not present

## 2017-01-15 DIAGNOSIS — M25562 Pain in left knee: Secondary | ICD-10-CM | POA: Diagnosis not present

## 2017-01-15 DIAGNOSIS — R2681 Unsteadiness on feet: Secondary | ICD-10-CM | POA: Diagnosis not present

## 2017-01-15 DIAGNOSIS — R5381 Other malaise: Secondary | ICD-10-CM | POA: Diagnosis not present

## 2017-01-15 DIAGNOSIS — M6281 Muscle weakness (generalized): Secondary | ICD-10-CM | POA: Diagnosis not present

## 2017-01-15 LAB — CBC AND DIFFERENTIAL
HEMATOCRIT: 42 % (ref 41–53)
HEMOGLOBIN: 12.5 g/dL — AB (ref 13.5–17.5)
PLATELETS: 271 10*3/uL (ref 150–399)
WBC: 8.5 10^3/mL

## 2017-01-15 LAB — BASIC METABOLIC PANEL
BUN: 18 mg/dL (ref 4–21)
CREATININE: 1.4 mg/dL — AB (ref 0.6–1.3)
Glucose: 300 mg/dL
Potassium: 4 mmol/L (ref 3.4–5.3)
Sodium: 137 mmol/L (ref 137–147)

## 2017-01-16 DIAGNOSIS — R5381 Other malaise: Secondary | ICD-10-CM | POA: Diagnosis not present

## 2017-01-16 DIAGNOSIS — R2681 Unsteadiness on feet: Secondary | ICD-10-CM | POA: Diagnosis not present

## 2017-01-16 DIAGNOSIS — R21 Rash and other nonspecific skin eruption: Secondary | ICD-10-CM | POA: Diagnosis not present

## 2017-01-16 DIAGNOSIS — G8929 Other chronic pain: Secondary | ICD-10-CM | POA: Diagnosis not present

## 2017-01-16 DIAGNOSIS — M6281 Muscle weakness (generalized): Secondary | ICD-10-CM | POA: Diagnosis not present

## 2017-01-16 DIAGNOSIS — M25562 Pain in left knee: Secondary | ICD-10-CM | POA: Diagnosis not present

## 2017-01-17 DIAGNOSIS — M6281 Muscle weakness (generalized): Secondary | ICD-10-CM | POA: Diagnosis not present

## 2017-01-17 DIAGNOSIS — G8929 Other chronic pain: Secondary | ICD-10-CM | POA: Diagnosis not present

## 2017-01-17 DIAGNOSIS — R5381 Other malaise: Secondary | ICD-10-CM | POA: Diagnosis not present

## 2017-01-17 DIAGNOSIS — M25562 Pain in left knee: Secondary | ICD-10-CM | POA: Diagnosis not present

## 2017-01-17 DIAGNOSIS — R21 Rash and other nonspecific skin eruption: Secondary | ICD-10-CM | POA: Diagnosis not present

## 2017-01-17 DIAGNOSIS — R2681 Unsteadiness on feet: Secondary | ICD-10-CM | POA: Diagnosis not present

## 2017-01-19 LAB — BASIC METABOLIC PANEL
BUN: 12 mg/dL (ref 4–21)
CREATININE: 1.4 mg/dL — AB (ref 0.6–1.3)
GLUCOSE: 301 mg/dL
Potassium: 3.7 mmol/L (ref 3.4–5.3)
Sodium: 139 mmol/L (ref 137–147)

## 2017-01-19 LAB — CBC AND DIFFERENTIAL
HCT: 42 % (ref 41–53)
HEMOGLOBIN: 12.5 g/dL — AB (ref 13.5–17.5)
Neutrophils Absolute: 5 /uL
PLATELETS: 223 10*3/uL (ref 150–399)
WBC: 6.8 10*3/mL

## 2017-01-22 ENCOUNTER — Non-Acute Institutional Stay (SKILLED_NURSING_FACILITY): Payer: Medicare Other | Admitting: Internal Medicine

## 2017-01-22 ENCOUNTER — Encounter: Payer: Self-pay | Admitting: Internal Medicine

## 2017-01-22 DIAGNOSIS — N183 Chronic kidney disease, stage 3 unspecified: Secondary | ICD-10-CM

## 2017-01-22 DIAGNOSIS — E1149 Type 2 diabetes mellitus with other diabetic neurological complication: Secondary | ICD-10-CM

## 2017-01-22 DIAGNOSIS — L03119 Cellulitis of unspecified part of limb: Secondary | ICD-10-CM | POA: Diagnosis not present

## 2017-01-22 DIAGNOSIS — A0472 Enterocolitis due to Clostridium difficile, not specified as recurrent: Secondary | ICD-10-CM

## 2017-01-22 DIAGNOSIS — I10 Essential (primary) hypertension: Secondary | ICD-10-CM | POA: Diagnosis not present

## 2017-01-22 DIAGNOSIS — I872 Venous insufficiency (chronic) (peripheral): Secondary | ICD-10-CM

## 2017-01-22 DIAGNOSIS — R531 Weakness: Secondary | ICD-10-CM | POA: Diagnosis not present

## 2017-01-22 DIAGNOSIS — I480 Paroxysmal atrial fibrillation: Secondary | ICD-10-CM

## 2017-01-22 DIAGNOSIS — J9612 Chronic respiratory failure with hypercapnia: Secondary | ICD-10-CM | POA: Diagnosis not present

## 2017-01-22 DIAGNOSIS — K219 Gastro-esophageal reflux disease without esophagitis: Secondary | ICD-10-CM | POA: Diagnosis not present

## 2017-01-22 DIAGNOSIS — E1142 Type 2 diabetes mellitus with diabetic polyneuropathy: Secondary | ICD-10-CM

## 2017-01-22 DIAGNOSIS — I5022 Chronic systolic (congestive) heart failure: Secondary | ICD-10-CM | POA: Diagnosis not present

## 2017-01-22 DIAGNOSIS — I639 Cerebral infarction, unspecified: Secondary | ICD-10-CM

## 2017-01-22 NOTE — Progress Notes (Signed)
LOCATION: [place] [facility]  PCP: Henrine Screws, MD   Code Status: Full Code  Goals of care: Advanced Directive information Advanced Directives 01/10/2017  Does Patient Have a Medical Advance Directive? Yes  Type of Advance Directive Out of facility DNR (pink MOST or yellow form)  Does patient want to make changes to medical advance directive? No - Patient declined  Copy of [place] in Chart? -  Would patient like information on creating a medical advance directive? No - Patient declined  Pre-existing out of facility DNR order (yellow form or pink MOST form) -       Extended Emergency Contact Information Primary Emergency Contact: Yingst,[name] Address: [address]          [place], [place] [address] Johnnette Litter of [place] Phone: [phone] Mobile Phone: [phone] Relation: Son Secondary Emergency Contact: Steck,[name] Address: [address]          [place], [place] [address] Johnnette Litter of [place] Phone: [phone] Relation: Relative   Allergies  Allergen Reactions  . Ativan [Lorazepam] Other (See Comments)    Flailing of extremities noted immediately s/p IV administration.  [name] Kitchen Flomax [Tamsulosin Hcl] Other (See Comments)    Excessive tearing  . Glucophage [Metformin Hcl] Diarrhea    Chief Complaint  Patient presents with  . New Admit To SNF    New Admission Visit      HPI:  Patient is a 83 y.o. male seen today for short term rehabilitation post hospital admission from 01/01/17-01/09/17 with change in mental state, slurred speech and frequent falls. MRI brain was suggestive of acute stroke. He was seen by neurology and continued on aspirin and Plavix. He was treated for acute on chronic respiratory failure and was on BiPAP and Lasix. He was also started on antibiotic for C. difficile colitis and has completed his antibiotic treatment. He was treated for lower extremity cellulitis and has completed his antibiotic  course. He responded well to IV fluids for acute on chronic renal failure. Seizure was ruled out this hospital admission. He has medical history of essential hypertension, chronic kidney disease stage III, prior stroke, CHF, diabetes mellitus, hyperlipidemia among others. He is seen in his room today.  Review of Systems:  Constitutional: Negative for fever, chills, diaphoresis. Energy level is low. HENT: Negative for headache, congestion, nasal discharge, sore throat, difficulty swallowing.   Eyes: Negative for blurred vision, double vision and discharge.  Respiratory: Negative for cough, shortness of breath and wheezing.   Cardiovascular: Negative for chest pain, palpitations, leg swelling.  Gastrointestinal: Negative for heartburn, nausea, vomiting, abdominal pain, loss of appetite. Last bowel movement was yesterday.  Genitourinary: Negative for dysuria and flank pain.  Musculoskeletal: Negative for fall in the facility.  Skin: Negative for itching, rash.  Neurological: Negative for dizziness. Psychiatric/Behavioral: Negative for depression   Past Medical History:  Diagnosis Date  . Alpha thalassemia ([place])   . Anemia 01/01/2017  . BPH (benign prostatic hyperplasia)   . CHF (congestive heart failure) ([place])   . High cholesterol   . Hypertension   . Lower extremity edema   . Onychomycosis   . Prostate cancer ([place])    S/P "8 weeks of radiation"  . Prostatitis    recurrent  . Sleep apnea   . Stroke [facility]) ~ 2005   denies residual on 2/123/2015  . Type II diabetes mellitus ([place])   . Umbilical hernia   . Urinary urgency    with incontinence  Past Surgical History:  Procedure Laterality Date  . ADENOIDECTOMY    . HERNIA REPAIR    . MASTOIDECTOMY    . TONSILLECTOMY    . VASECTOMY    . VENTRAL HERNIA REPAIR  12/18/2011   Procedure: HERNIA REPAIR VENTRAL ADULT;  Surgeon: Adin Hector, MD;  Location: [facility];  Service: General;  Laterality: N/A;   Social History:   reports that  he has never smoked. He has never used smokeless tobacco. He reports that he does not drink alcohol or use drugs.  Family History  Problem Relation Age of Onset  . Pneumonia Mother   . Heart attack Father     Medications: Allergies as of 01/22/2017      Reactions   Ativan [lorazepam] Other (See Comments)   Flailing of extremities noted immediately s/p IV administration.   Flomax [tamsulosin Hcl] Other (See Comments)   Excessive tearing   Glucophage [metformin Hcl] Diarrhea      Medication List       Accurate as of 01/22/17  1:21 PM. Always use your most recent med list.          amitriptyline 25 MG tablet Commonly known as:  ELAVIL Take 25 mg by mouth at bedtime.   aspirin EC 81 MG tablet Take 81 mg by mouth daily.   clopidogrel 75 MG tablet Commonly known as:  PLAVIX Take 75 mg by mouth daily.   clotrimazole-betamethasone lotion Commonly known as:  LOTRISONE Apply 1 application topically 2 (two) times daily as needed (antifungal).   fluticasone 50 MCG/ACT nasal spray Commonly known as:  FLONASE Place 1 spray into both nostrils daily.   furosemide 40 MG tablet Commonly known as:  LASIX Take 40-80 mg by mouth daily. Take 80 mg QAM and 40 mg QPM for CHF   gabapentin 100 MG capsule Commonly known as:  NEURONTIN Take 1 capsule (100 mg total) by mouth 3 (three) times daily.   hydrocerin Crea Apply 1 application topically daily.   insulin glargine 100 UNIT/ML injection Commonly known as:  LANTUS Inject 28 Units into the skin at bedtime.   metoprolol tartrate 25 MG tablet Commonly known as:  LOPRESSOR Take 1 tablet (25 mg total) by mouth 2 (two) times daily.   pantoprazole 40 MG tablet Commonly known as:  PROTONIX Take 40 mg by mouth daily.   potassium chloride 20 MEQ packet Commonly known as:  KLOR-CON Take 20 mEq by mouth 2 (two) times daily.   simvastatin 20 MG tablet Commonly known as:  ZOCOR Take 20 mg by mouth daily.   terazosin 2 MG  capsule Commonly known as:  HYTRIN Take 4 mg by mouth daily. Take two 2-mg tablets to = 4 mg   terbinafine 250 MG tablet Commonly known as:  LAMISIL Take 250 mg by mouth daily. continous   VITAMIN B COMPLEX PO Take 1 tablet by mouth daily.   vitamin B-12 1000 MCG tablet Commonly known as:  CYANOCOBALAMIN Take 1,000 mcg by mouth daily.       Immunizations: Immunization History  Administered Date(s) Administered  . Influenza-Unspecified 08/20/2013  . Pneumococcal-Unspecified 08/20/2012     Physical Exam: Vitals:   01/22/17 1314  BP: 109/68  Pulse: 66  Resp: 20  Temp: 97.1 F (36.2 C)  TempSrc: Oral  SpO2: 96%  Weight: 246 lb 3.2 oz (111.7 kg)  Height: 5\' 11"  (1.803 m)   Body mass index is 34.34 kg/m.  General- elderly Male, obese, in no acute distress Head-  normocephalic, atraumatic Nose- no nasal discharge Throat- moist mucus membrane, edentulous Eyes- PERRLA, EOMI, no pallor, no icterus, no discharge, normal conjunctiva, normal sclera Neck- no cervical lymphadenopathy Cardiovascular- normal s1,s2, no murmur Respiratory- bilateral clear to auscultation, no wheeze, no rhonchi, no crackles, no use of accessory muscles, on 3 L oxygen by nasal cannula Abdomen- bowel sounds present, soft, non tender Musculoskeletal- able to move all 4 extremities, generalized weakness, trace leg edema Neurological- alert and oriented to person, place and time Skin- warm and dry, chronic skin changes to lower legs With mild erythema, normal temperature to touch, nontender, dry skin Psychiatry- normal mood and affect    Labs reviewed: Basic Metabolic Panel:  Recent Labs  01/07/17 0828 01/08/17 1052 01/08/17 1842 01/09/17 0901 01/15/17  NA 139 140  --  140 137  K 3.5 3.4* 3.6 3.8 4.0  CL 96* 97*  --  97*  --   CO2 30 31  --  32  --   GLUCOSE 193* 324*  --  310*  --   BUN 16 17  --  15 18  CREATININE 1.24 1.20  --  1.14 1.4*  CALCIUM 9.2 9.5  --  9.6  --   MG  --   --   1.9  --   --    Liver Function Tests:  Recent Labs  12/09/16 0528 01/01/17 1507  AST 20 24  ALT 21 19  ALKPHOS 68 68  BILITOT 0.4 0.7  PROT 6.7 5.9*  ALBUMIN 3.0* 2.9*   No results for input(s): LIPASE, AMYLASE in the last 8760 hours.  Recent Labs  01/01/17 1507  AMMONIA 19   CBC:  Recent Labs  12/05/16 1741  01/01/17 1507  01/07/17 0828 01/08/17 1052 01/09/17 0901 01/15/17  WBC 13.5*  < > 9.5  < > 12.2* 12.2* 12.6* 8.5  NEUTROABS 10.5*  --  6.7  --   --   --   --   --   HGB 11.4*  < > 10.6*  < > 12.5* 12.5* 12.8* 12.5*  HCT 36.1*  < > 35.6*  < > 39.9 39.9 40.9 42  MCV 68.8*  < > 69.4*  < > 67.7* 67.4* 68.1*  --   PLT 240  < > 164  < > 142* 190 195 271  < > = values in this interval not displayed. Cardiac Enzymes: No results for input(s): CKTOTAL, CKMB, CKMBINDEX, TROPONINI in the last 8760 hours. BNP: Invalid input(s): POCBNP CBG:  Recent Labs  01/09/17 1115 01/09/17 1622 01/09/17 2044  GLUCAP 254* 237* 230*    Radiological Exams: Ct Angio Head W Or Wo Contrast  Result Date: 01/03/2017 CLINICAL DATA:  Stroke EXAM: CT ANGIOGRAPHY HEAD AND NECK TECHNIQUE: Multidetector CT imaging of the head and neck was performed using the standard protocol during bolus administration of intravenous contrast. Multiplanar CT image reconstructions and MIPs were obtained to evaluate the vascular anatomy. Carotid stenosis measurements (when applicable) are obtained utilizing NASCET criteria, using the distal internal carotid diameter as the denominator. COMPARISON:  Brain MRI 01/01/2017 FINDINGS: CT HEAD FINDINGS Brain: No mass lesion, intraparenchymal hemorrhage or extra-axial collection. No evidence of acute cortical infarct. There is periventricular hypoattenuation compatible with chronic microvascular disease. Old right cerebellar infarct. Skull: Normal visualized skull base, calvarium and extracranial soft tissues. Sinuses/Orbits: No sinus fluid levels or advanced mucosal  thickening. No mastoid effusion. Normal orbits. CTA NECK FINDINGS Aortic arch: There is no aneurysm or dissection of the visualized ascending aorta or  aortic arch. There is a normal 3 vessel branching pattern. The visualized proximal subclavian arteries are normal. There is atherosclerotic calcification within the aortic arch. Right carotid system: The right common carotid origin is widely patent. There is no common carotid or internal carotid artery dissection or aneurysm. No hemodynamically significant stenosis. There is mild atherosclerotic calcification at the right carotid bifurcation. Left carotid system: The left common carotid origin is widely patent. There is no common carotid or internal carotid artery dissection or aneurysm. No hemodynamically significant stenosis. Vertebral arteries: The vertebral system is left dominant. There is atherosclerotic calcification of the left vertebral artery origin, which remains widely patent. Both vertebral arteries are normal to their confluence with the basilar artery. Skeleton: There is no bony spinal canal stenosis. No lytic or blastic lesions. Other neck: The nasopharynx is clear. The oropharynx and hypopharynx are normal. The epiglottis is normal. The supraglottic larynx, glottis and subglottic larynx are normal. No retropharyngeal collection. The parapharyngeal spaces are preserved. The parotid and submandibular glands are normal. No sialolithiasis or salivary ductal dilatation. The thyroid gland is normal. There is no cervical lymphadenopathy. Upper chest: No pneumothorax or pleural effusion. No nodules or masses. Lingular atelectasis. Review of the MIP images confirms the above findings CTA HEAD FINDINGS Anterior circulation: --Intracranial internal carotid arteries: There is atherosclerotic calcification of the right cavernous and left cavernous and clinoid segments without hemodynamically significant stenosis. --Anterior cerebral arteries: Normal. --Middle  cerebral arteries: Normal. --Posterior communicating arteries: Absent bilaterally. Posterior circulation: --Posterior cerebral arteries: Normal. --Superior cerebellar arteries: Normal. --Basilar artery: Normal. --Anterior inferior cerebellar arteries: Normal. --Posterior inferior cerebellar arteries: Normal. Venous sinuses: As permitted by contrast timing, patent. Anatomic variants: None Delayed phase: No parenchymal contrast enhancement. Review of the MIP images confirms the above findings IMPRESSION: 1. No CT evidence of acute intracranial abnormality. The small infarct seen on the prior MRI is not visible on this study. 2. No occlusion or hemodynamically significant stenosis of the intracranial or cervical arteries. 3. Aortic atherosclerosis. Electronically Signed   By: Ulyses Jarred M.D.   On: 01/03/2017 13:44   Dg Chest 2 View  Result Date: 01/01/2017 CLINICAL DATA:  Altered mental status EXAM: CHEST  2 VIEW COMPARISON:  12/10/2016 FINDINGS: Cardiomegaly again noted. Stable chronic elevation of left hemidiaphragm and prominent fat pad in left cardiophrenic angle. There is linear atelectasis or infiltrate in lingula. No pulmonary edema. Right lung is clear IMPRESSION: Stable chronic elevation of left hemidiaphragm and prominent fat pad in left cardiophrenic angle. There is linear atelectasis or infiltrate in lingula. No pulmonary edema. Right lung is clear Electronically Signed   By: Lahoma Crocker M.D.   On: 01/01/2017 16:25   Ct Head Wo Contrast  Result Date: 01/01/2017 CLINICAL DATA:  Pt has very altered mental status talking in undistinguished words and wavings arms around EXAM: CT HEAD WITHOUT CONTRAST CT CERVICAL SPINE WITHOUT CONTRAST TECHNIQUE: Multidetector CT imaging of the head and cervical spine was performed following the standard protocol without intravenous contrast. Multiplanar CT image reconstructions of the cervical spine were also generated. COMPARISON:  03/14/2015 and 03/15/2015 FINDINGS:  CT HEAD FINDINGS Brain: There is moderate central and cortical atrophy. Periventricular white matter changes are consistent with small vessel disease. Stable appearance of subdural hygroma in the posterior fossa. Remote right cerebellar infarct. There is no intra or extra-axial mass lesion. The basilar cisterns and ventricles have a normal appearance. There is no CT evidence for acute infarction or hemorrhage. Vascular: There is atherosclerotic calcification of the  carotid siphons. Skull: No calvarial fracture. Sinuses/Orbits: No acute finding. Other: None CT CERVICAL SPINE FINDINGS Alignment: There is significant degenerative changes in mid and lower cervical spine. Degenerative anterolisthesis of C5 on C6 measures approximately 4 mm. Skull base and vertebrae: No acute fracture. No primary bone lesion or focal pathologic process. Soft tissues and spinal canal: No prevertebral fluid or swelling. No visible canal hematoma. Disc levels: Significant disc height loss throughout the cervical spine. Bilateral foraminal narrowing at C3-4. Bilateral foraminal narrowing at C4-5. Bilateral foraminal narrowing at C6-7. Significant facet hypertrophy at all levels. Upper chest: Unremarkable. Other: Study quality is degraded by patient motion artifact. IMPRESSION: 1. Atrophy and small vessel disease. 2. 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 3. Remote lacunar infarct of the right cerebellar hemisphere. 4.  No evidence for acute CT abnormality. 5. Significant cervical spine degenerative changes without evidence for acute abnormality. Electronically Signed   By: Nolon Nations M.D.   On: 01/01/2017 16:10   Ct Angio Neck W Or Wo Contrast  Result Date: 01/03/2017 CLINICAL DATA:  Stroke EXAM: CT ANGIOGRAPHY HEAD AND NECK TECHNIQUE: Multidetector CT imaging of the head and neck was performed using the standard protocol during bolus administration of intravenous contrast. Multiplanar CT image reconstructions and MIPs were  obtained to evaluate the vascular anatomy. Carotid stenosis measurements (when applicable) are obtained utilizing NASCET criteria, using the distal internal carotid diameter as the denominator. COMPARISON:  Brain MRI 01/01/2017 FINDINGS: CT HEAD FINDINGS Brain: No mass lesion, intraparenchymal hemorrhage or extra-axial collection. No evidence of acute cortical infarct. There is periventricular hypoattenuation compatible with chronic microvascular disease. Old right cerebellar infarct. Skull: Normal visualized skull base, calvarium and extracranial soft tissues. Sinuses/Orbits: No sinus fluid levels or advanced mucosal thickening. No mastoid effusion. Normal orbits. CTA NECK FINDINGS Aortic arch: There is no aneurysm or dissection of the visualized ascending aorta or aortic arch. There is a normal 3 vessel branching pattern. The visualized proximal subclavian arteries are normal. There is atherosclerotic calcification within the aortic arch. Right carotid system: The right common carotid origin is widely patent. There is no common carotid or internal carotid artery dissection or aneurysm. No hemodynamically significant stenosis. There is mild atherosclerotic calcification at the right carotid bifurcation. Left carotid system: The left common carotid origin is widely patent. There is no common carotid or internal carotid artery dissection or aneurysm. No hemodynamically significant stenosis. Vertebral arteries: The vertebral system is left dominant. There is atherosclerotic calcification of the left vertebral artery origin, which remains widely patent. Both vertebral arteries are normal to their confluence with the basilar artery. Skeleton: There is no bony spinal canal stenosis. No lytic or blastic lesions. Other neck: The nasopharynx is clear. The oropharynx and hypopharynx are normal. The epiglottis is normal. The supraglottic larynx, glottis and subglottic larynx are normal. No retropharyngeal collection. The  parapharyngeal spaces are preserved. The parotid and submandibular glands are normal. No sialolithiasis or salivary ductal dilatation. The thyroid gland is normal. There is no cervical lymphadenopathy. Upper chest: No pneumothorax or pleural effusion. No nodules or masses. Lingular atelectasis. Review of the MIP images confirms the above findings CTA HEAD FINDINGS Anterior circulation: --Intracranial internal carotid arteries: There is atherosclerotic calcification of the right cavernous and left cavernous and clinoid segments without hemodynamically significant stenosis. --Anterior cerebral arteries: Normal. --Middle cerebral arteries: Normal. --Posterior communicating arteries: Absent bilaterally. Posterior circulation: --Posterior cerebral arteries: Normal. --Superior cerebellar arteries: Normal. --Basilar artery: Normal. --Anterior inferior cerebellar arteries: Normal. --Posterior inferior cerebellar arteries: Normal.  Venous sinuses: As permitted by contrast timing, patent. Anatomic variants: None Delayed phase: No parenchymal contrast enhancement. Review of the MIP images confirms the above findings IMPRESSION: 1. No CT evidence of acute intracranial abnormality. The small infarct seen on the prior MRI is not visible on this study. 2. No occlusion or hemodynamically significant stenosis of the intracranial or cervical arteries. 3. Aortic atherosclerosis. Electronically Signed   By: Ulyses Jarred M.D.   On: 01/03/2017 13:44   Ct Cervical Spine Wo Contrast  Result Date: 01/01/2017 CLINICAL DATA:  Pt has very altered mental status talking in undistinguished words and wavings arms around EXAM: CT HEAD WITHOUT CONTRAST CT CERVICAL SPINE WITHOUT CONTRAST TECHNIQUE: Multidetector CT imaging of the head and cervical spine was performed following the standard protocol without intravenous contrast. Multiplanar CT image reconstructions of the cervical spine were also generated. COMPARISON:  03/14/2015 and 03/15/2015  FINDINGS: CT HEAD FINDINGS Brain: There is moderate central and cortical atrophy. Periventricular white matter changes are consistent with small vessel disease. Stable appearance of subdural hygroma in the posterior fossa. Remote right cerebellar infarct. There is no intra or extra-axial mass lesion. The basilar cisterns and ventricles have a normal appearance. There is no CT evidence for acute infarction or hemorrhage. Vascular: There is atherosclerotic calcification of the carotid siphons. Skull: No calvarial fracture. Sinuses/Orbits: No acute finding. Other: None CT CERVICAL SPINE FINDINGS Alignment: There is significant degenerative changes in mid and lower cervical spine. Degenerative anterolisthesis of C5 on C6 measures approximately 4 mm. Skull base and vertebrae: No acute fracture. No primary bone lesion or focal pathologic process. Soft tissues and spinal canal: No prevertebral fluid or swelling. No visible canal hematoma. Disc levels: Significant disc height loss throughout the cervical spine. Bilateral foraminal narrowing at C3-4. Bilateral foraminal narrowing at C4-5. Bilateral foraminal narrowing at C6-7. Significant facet hypertrophy at all levels. Upper chest: Unremarkable. Other: Study quality is degraded by patient motion artifact. IMPRESSION: 1. Atrophy and small vessel disease. 2. 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 3. Remote lacunar infarct of the right cerebellar hemisphere. 4.  No evidence for acute CT abnormality. 5. Significant cervical spine degenerative changes without evidence for acute abnormality. Electronically Signed   By: Nolon Nations M.D.   On: 01/01/2017 16:10   Mr Brain Wo Contrast  Result Date: 01/01/2017 CLINICAL DATA:  Initial evaluation for acute slurred speech, progressive confusion. EXAM: MRI HEAD WITHOUT CONTRAST TECHNIQUE: Multiplanar, multiecho pulse sequences of the brain and surrounding structures were obtained without intravenous contrast.  COMPARISON:  Prior CT from earlier the same day. FINDINGS: Brain: The study is limited as the patient was unable to tolerate the full length of the exam. Additionally, images provided are moderately degraded by motion artifact. Diffuse prominence of the CSF containing spaces is compatible with generalized age-related cerebral atrophy. Patchy T2/FLAIR hyperintensity within the periventricular and deep white matter both cerebral hemispheres most consistent with chronic microvascular ischemic disease, fairly advanced in nature. Chronic microvascular ischemic changes present within the pons. Remote bilateral cerebellar infarcts noted. Additional focus of cortical encephalomalacia within the right parietal lobe compatible with remote ischemic infarct as well. There is a punctate 5-6 mm focus of diffusion abnormality within the right periatrial white matter (series 3, image 31), suspicious for a small acute/ early subacute small vessel ischemic infarct. No associated mass effect. No definite associated hemorrhage, although evaluation for blood products limited on this exam. No other abnormal foci of restricted diffusion to suggest acute or subacute ischemia. Gray-white matter  differentiation otherwise maintained. No mass lesion, midline shift, or mass effect. Ventricular prominence related to global parenchymal volume loss without hydrocephalus. No extra-axial fluid collection. Major dural sinuses are grossly patent. Vascular: Major intracranial vascular flow voids are maintained. Right vertebral artery hypoplastic. Skull and upper cervical spine: Craniocervical junction grossly unremarkable, although not well evaluated on this limited exam. Scalp soft tissues unremarkable. Calvarium intact. Bone marrow signal intensity grossly normal. Sinuses/Orbits: Globes and orbital soft tissues within normal limits. Paranasal sinuses are largely clear. Small bilateral mastoid effusions, left greater than right. Inner ear structures  grossly normal. IMPRESSION: 1. Limited study due to patient's inability to tolerate the full length of the exam and motion artifact. 2. 5-6 mm focus of diffusion abnormality within the right periatrial white matter, suspicious for a small acute/early subacute small vessel ischemic infarct. 3. No other acute intracranial process identified. 4. Advanced cerebral atrophy with chronic microvascular ischemic disease and scattered remote ischemic infarcts as above. Electronically Signed   By: Jeannine Boga M.D.   On: 01/01/2017 23:17   Dg Abd Portable 1v  Result Date: 01/07/2017 CLINICAL DATA:  Abdominal pain EXAM: PORTABLE ABDOMEN - 1 VIEW COMPARISON:  CT abdomen and pelvis Apr 04, 2013 FINDINGS: There is no bowel dilatation or air-fluid level suggesting bowel obstruction. No free air. There is aortic atherosclerosis. IMPRESSION: No bowel obstruction or free air evident.  Aortic atherosclerosis. Electronically Signed   By: Lowella Grip III M.D.   On: 01/07/2017 13:23    Assessment/Plan  Generalized weakness From physical deconditioning.Will have him work with physical therapy and occupational therapy team to help with gait training and muscle strengthening exercises.fall precautions. Skin care. Encourage to be out of bed.   Right parietal subacute stroke Continue aspirin and Plavix. Continue simvastatin  C. difficile colitis No further diarrhea reported. Has completed his course of by mouth vancomycin. Monitor clinically.  Lower extremity cellulitis Has completed his antibiotic course. Skin looks improved. Monitor clinically.  Chronic hypercapnic respiratory failure Likely has a complement of obstructive sleep apnea/obesity hypoventilation syndrome. Patient has refused BiPAP in the hospital. He will likely benefit from a CPAP machine. Will need formal sleep study. Currently on oxygen 3 L by nasal cannula. Wean off oxygen as tolerated and monitor.  Venous stasis dermatitis Has chronic  skin changes to lower extremity. To provide foot care and to apply Hydroserpine cream to his feet. Monitor for wound/sores  Chronic systolic CHF Continue Lasix 80 mg in the morning and 40 mg daily in the evening, metoprolol tartrate 25 mg twice a day and potassium chloride supplements  Chronic kidney disease stage III Monitor BMP  Atrial fibrillation Rate controlled. Continue metoprolol tartrate. Off anticoagulation due to increased fall risk.  Diabetes mellitus with neuropathy Currently on Lantus 28 units at bedtime. Monitor blood sugar reading. Controlled neuropathy. Continue gabapentin 100 mg 3 times a day. Lab Results  Component Value Date   HGBA1C 8.6 (H) 01/02/2017   Neuropathic pain Continue amitriptyline and gabapentin, no changes made  GERD  controlled on pantoprazole current regimen, no changes made    Goals of care: short term rehabilitation   Labs/tests ordered: cbc,bmp 01/23/17  Family/ staff Communication: reviewed care plan with patient and nursing supervisor    Blanchie Serve, MD Internal Medicine [place], [place] [address] Cell Phone (Monday-Friday 8 am - 5 pm): [phone] On Call: [phone] and follow prompts after 5 pm and on weekends Office Phone: [phone] Office Fax:  [phone]   

## 2017-01-23 DIAGNOSIS — M6281 Muscle weakness (generalized): Secondary | ICD-10-CM | POA: Diagnosis not present

## 2017-01-23 DIAGNOSIS — R21 Rash and other nonspecific skin eruption: Secondary | ICD-10-CM | POA: Diagnosis not present

## 2017-01-23 DIAGNOSIS — G8929 Other chronic pain: Secondary | ICD-10-CM | POA: Diagnosis not present

## 2017-01-23 DIAGNOSIS — R2681 Unsteadiness on feet: Secondary | ICD-10-CM | POA: Diagnosis not present

## 2017-01-23 DIAGNOSIS — M25562 Pain in left knee: Secondary | ICD-10-CM | POA: Diagnosis not present

## 2017-01-23 DIAGNOSIS — R5381 Other malaise: Secondary | ICD-10-CM | POA: Diagnosis not present

## 2017-01-24 DIAGNOSIS — R5381 Other malaise: Secondary | ICD-10-CM | POA: Diagnosis not present

## 2017-01-24 DIAGNOSIS — M6281 Muscle weakness (generalized): Secondary | ICD-10-CM | POA: Diagnosis not present

## 2017-01-24 DIAGNOSIS — G8929 Other chronic pain: Secondary | ICD-10-CM | POA: Diagnosis not present

## 2017-01-24 DIAGNOSIS — M25562 Pain in left knee: Secondary | ICD-10-CM | POA: Diagnosis not present

## 2017-01-24 DIAGNOSIS — R21 Rash and other nonspecific skin eruption: Secondary | ICD-10-CM | POA: Diagnosis not present

## 2017-01-24 DIAGNOSIS — R2681 Unsteadiness on feet: Secondary | ICD-10-CM | POA: Diagnosis not present

## 2017-01-24 LAB — BASIC METABOLIC PANEL
BUN: 15 mg/dL (ref 4–21)
Creatinine: 1.7 mg/dL — AB (ref 0.6–1.3)
GLUCOSE: 324 mg/dL
Potassium: 4.1 mmol/L (ref 3.4–5.3)
Sodium: 135 mmol/L — AB (ref 137–147)

## 2017-01-24 LAB — CBC AND DIFFERENTIAL
HCT: 43 % (ref 41–53)
Hemoglobin: 12.9 g/dL — AB (ref 13.5–17.5)
NEUTROS ABS: 5 /uL
PLATELETS: 199 10*3/uL (ref 150–399)
WBC: 7.8 10*3/mL

## 2017-01-26 ENCOUNTER — Encounter: Payer: Self-pay | Admitting: *Deleted

## 2017-01-26 ENCOUNTER — Other Ambulatory Visit: Payer: Self-pay | Admitting: *Deleted

## 2017-01-26 DIAGNOSIS — G8929 Other chronic pain: Secondary | ICD-10-CM | POA: Diagnosis not present

## 2017-01-26 DIAGNOSIS — M25562 Pain in left knee: Secondary | ICD-10-CM | POA: Diagnosis not present

## 2017-01-26 DIAGNOSIS — R5381 Other malaise: Secondary | ICD-10-CM | POA: Diagnosis not present

## 2017-01-26 DIAGNOSIS — M6281 Muscle weakness (generalized): Secondary | ICD-10-CM | POA: Diagnosis not present

## 2017-01-26 DIAGNOSIS — R2681 Unsteadiness on feet: Secondary | ICD-10-CM | POA: Diagnosis not present

## 2017-01-26 DIAGNOSIS — R21 Rash and other nonspecific skin eruption: Secondary | ICD-10-CM | POA: Diagnosis not present

## 2017-01-26 NOTE — Patient Outreach (Signed)
[facility]) Care Management  [facility] Social Work  01/26/2017  Jared Hall September 21, 1931 [id number]  Subjective:  Patient states that he will be moving to the [facility] on Monday.  Per patient he would rather go home but has agreed to the [facility].Patient discussed actively  Participating in PT and OT.  Objective:   Encounter Medications:  Outpatient Encounter Prescriptions as of 01/26/2017  Medication Sig  . amitriptyline (ELAVIL) 25 MG tablet Take 25 mg by mouth at bedtime.  [name] Kitchen aspirin EC 81 MG tablet Take 81 mg by mouth daily.  . B Complex Vitamins (VITAMIN B COMPLEX PO) Take 1 tablet by mouth daily.  . clopidogrel (PLAVIX) 75 MG tablet Take 75 mg by mouth daily.   . clotrimazole-betamethasone (LOTRISONE) lotion Apply 1 application topically 2 (two) times daily as needed (antifungal).   . fluticasone (FLONASE) 50 MCG/ACT nasal spray Place 1 spray into both nostrils daily.  . furosemide (LASIX) 40 MG tablet Take 40-80 mg by mouth daily. Take 80 mg QAM and 40 mg QPM for CHF  . gabapentin (NEURONTIN) 100 MG capsule Take 1 capsule (100 mg total) by mouth 3 (three) times daily.  . hydrocerin (EUCERIN) CREA Apply 1 application topically daily.  . insulin glargine (LANTUS) 100 UNIT/ML injection Inject 28 Units into the skin at bedtime.  . metoprolol tartrate (LOPRESSOR) 25 MG tablet Take 1 tablet (25 mg total) by mouth 2 (two) times daily.  . pantoprazole (PROTONIX) 40 MG tablet Take 40 mg by mouth daily.  . potassium chloride (KLOR-CON) 20 MEQ packet Take 20 mEq by mouth 2 (two) times daily.  . simvastatin (ZOCOR) 20 MG tablet Take 20 mg by mouth daily.  [name] Kitchen terazosin (HYTRIN) 2 MG capsule Take 4 mg by mouth daily. Take two 2-mg tablets to = 4 mg  . terbinafine (LAMISIL) 250 MG tablet Take 250 mg by mouth daily. continous  . vitamin B-12 (CYANOCOBALAMIN) 1000 MCG tablet Take 1,000 mcg by mouth daily.   No facility-administered encounter medications on file as of  01/26/2017.     Functional Status:  In your present state of health, do you have any difficulty performing the following activities: 01/26/2017 01/26/2017  Hearing? - Y  Vision? - Y  Difficulty concentrating or making decisions? - Y  Walking or climbing stairs? - Y  Dressing or bathing? - Y  Doing errands, shopping? - Y  Preparing Food and eating ? - Y  Using the Toilet? Y N  In the past six months, have you accidently leaked urine? Y N  Do you have problems with loss of bowel control? Y -  Managing your Medications? - Y  Managing your Finances? - N  Housekeeping or managing your Housekeeping? - Y  Some recent data might be hidden    Fall/Depression Screening:  PHQ 2/9 Scores 01/26/2017 11/02/2016 10/19/2016  PHQ - 2 Score 0 0 0    Assessment:  This Education officer, museum confirmed with Jared Hall, the discharge planner that patient will be transferring to the [facility] on Monday 01/29/17. Per the discharge planner, patient's daughter in law to meet with the Admissions Director from [facility] today to discuss contract details Phone call to patient's daughter in law Jared Hall who also confirmed plan for patient to transition to ALF post discharge from [facility] due to safety concerns if patient returns home alone.  She will follow up with Jared Hall to discuss contract details. She has already paid the deposit.  Patient's  daughter discussed concerns of ALF being the right decision for patient. This Education officer, museum emphasized safety and suggested providing patient with transitional items. Patient is a English as a second language teacher, per patient's daughter in law they are also working with Jared Hall to apply for aid and attendance for patient.   Plan:  This Education officer, museum will follow up with patient's daughter in law Jared Hall, to confirm transition to the [facility].   Jared Hall [name] Care Management [phone]

## 2017-01-29 ENCOUNTER — Non-Acute Institutional Stay (SKILLED_NURSING_FACILITY): Payer: Medicare Other | Admitting: Adult Health

## 2017-01-29 ENCOUNTER — Encounter: Payer: Self-pay | Admitting: Adult Health

## 2017-01-29 DIAGNOSIS — G629 Polyneuropathy, unspecified: Secondary | ICD-10-CM | POA: Diagnosis not present

## 2017-01-29 DIAGNOSIS — E1142 Type 2 diabetes mellitus with diabetic polyneuropathy: Secondary | ICD-10-CM | POA: Diagnosis not present

## 2017-01-29 DIAGNOSIS — I10 Essential (primary) hypertension: Secondary | ICD-10-CM

## 2017-01-29 DIAGNOSIS — N400 Enlarged prostate without lower urinary tract symptoms: Secondary | ICD-10-CM | POA: Diagnosis not present

## 2017-01-29 DIAGNOSIS — J309 Allergic rhinitis, unspecified: Secondary | ICD-10-CM | POA: Diagnosis not present

## 2017-01-29 DIAGNOSIS — N183 Chronic kidney disease, stage 3 unspecified: Secondary | ICD-10-CM

## 2017-01-29 DIAGNOSIS — Z8673 Personal history of transient ischemic attack (TIA), and cerebral infarction without residual deficits: Secondary | ICD-10-CM | POA: Diagnosis not present

## 2017-01-29 DIAGNOSIS — B351 Tinea unguium: Secondary | ICD-10-CM

## 2017-01-29 DIAGNOSIS — R531 Weakness: Secondary | ICD-10-CM

## 2017-01-29 DIAGNOSIS — E876 Hypokalemia: Secondary | ICD-10-CM | POA: Diagnosis not present

## 2017-01-29 DIAGNOSIS — E785 Hyperlipidemia, unspecified: Secondary | ICD-10-CM

## 2017-01-29 DIAGNOSIS — J9611 Chronic respiratory failure with hypoxia: Secondary | ICD-10-CM

## 2017-01-29 DIAGNOSIS — I5032 Chronic diastolic (congestive) heart failure: Secondary | ICD-10-CM

## 2017-01-29 DIAGNOSIS — I480 Paroxysmal atrial fibrillation: Secondary | ICD-10-CM

## 2017-01-29 DIAGNOSIS — F339 Major depressive disorder, recurrent, unspecified: Secondary | ICD-10-CM

## 2017-01-29 DIAGNOSIS — K219 Gastro-esophageal reflux disease without esophagitis: Secondary | ICD-10-CM

## 2017-01-29 NOTE — Progress Notes (Signed)
DATE:  01/29/2017   MRN:  [id number]  BIRTHDAY: [birth year]  Facility:  [facility]urs[facility] Home Location:  [place] Room Number: 056-P  LEVEL OF CARE:  SNF (31)  Contact Information    Name Relation Home Work Mobile   Garvin Son [phone]  [phone]   Klepper,[name] Relative [phone]         Code Status History    Date Active Date Inactive Code Status Order ID Comments User Context   01/02/2017  2:31 PM 01/10/2017 12:10 AM DNR [id number]  Germain Osgood, PA-C Inpatient   01/01/2017  8:54 PM 01/02/2017  2:31 PM Full Code [id number]  Eugenie Filler, MD Inpatient   12/05/2016 10:29 PM 12/15/2016  4:33 PM Full Code [id number]  Norval Morton, MD ED   03/14/2015 11:49 PM 03/17/2015  7:46 PM Full Code [id number]  Jani Gravel, MD Inpatient   01/11/2014  2:49 PM 01/13/2014  4:19 PM DNR [id number]  Annita Brod, MD Inpatient   04/04/2013  5:21 PM 04/08/2013  6:46 PM Full Code [id number]  Orson Eva, MD Inpatient   12/18/2011  1:41 PM 12/22/2011  2:29 PM Full Code [id number]  Courtney Heys, RN Inpatient   12/14/2011  1:41 AM 12/18/2011  1:41 PM Full Code [id number]  Malva Limes, RN Inpatient    Questions for Most Recent Historical Code Status (Order [id number])    Question Answer Comment   In the event of cardiac or respiratory ARREST Do not call a "code blue"    In the event of cardiac or respiratory ARREST Do not perform Intubation, CPR, defibrillation or ACLS    In the event of cardiac or respiratory ARREST Use medication by any route, position, wound care, and other measures to relive pain and suffering. May use oxygen, suction and manual treatment of airway obstruction as needed for comfort.         Advance Directive Documentation   Flowsheet Row Most Recent Value  Type of Advance Directive  Out of facility DNR (pink MOST or yellow form)  Pre-existing out of facility DNR order (yellow form or pink MOST form)  No data  "MOST" Form in Place?  No data        Chief Complaint  Patient presents with  . Discharge Note    HISTORY OF PRESENT ILLNESS:  This is an 42-YO male who is for discharge home with Home health PT, OT, CNA, Nursing and ST.  Lantus was recently increased from 28 units to 30 units Q HS and added Humalog sliding scale BID due to elevated CBGs.  He is on 3L O2 and verbalized that he has O2 @ home. Sleep study was recently ordered to be scheduled with concern for sleep apnea.   He was re-admitted to [facility] for short-term rehabilitation on 01/09/2017 following an admission at [facility] 01/01/2017-01/09/2017 for a 4-day history of slurred speech and progressive confusion. MRI showed stroke. Neurology was consulted and does not think that MRI finding explain patient mental status. MS is thought to be related delirium, infection. He had multiple BMs and tested positive for C-Difficile. He was started on Vancomycin. He was started on cefepime for lower extremity cellulitis. Cefepime was later discontinued. LE redness Improving.  Patient was admitted to this facility for short-term rehabilitation after the patient's recent hospitalization.  Patient has completed SNF rehabilitation and therapy has cleared the patient for discharge.   PAST MEDICAL HISTORY:  Past Medical History:  Diagnosis Date  . Alpha thalassemia ([place])   . Anemia 01/01/2017  . BPH (benign prostatic hyperplasia)   . CHF (congestive heart failure) ([place])   . High cholesterol   . Hypertension   . Lower extremity edema   . Onychomycosis   . Prostate cancer ([place])    S/P "8 weeks of radiation"  . Prostatitis    recurrent  . Sleep apnea   . Stroke [facility]) ~ 2005   denies residual on 2/123/2015  . Type II diabetes mellitus ([place])   . Umbilical hernia   . Urinary urgency    with incontinence     CURRENT MEDICATIONS: Reviewed  Patient's Medications  New Prescriptions   No medications on file  Previous Medications   AMITRIPTYLINE (ELAVIL) 25 MG  TABLET    Take 25 mg by mouth at bedtime.   ASPIRIN EC 81 MG TABLET    Take 81 mg by mouth daily.   B COMPLEX VITAMINS (VITAMIN B COMPLEX PO)    Take 1 tablet by mouth daily.   CLOPIDOGREL (PLAVIX) 75 MG TABLET    Take 75 mg by mouth daily.    CLOTRIMAZOLE-BETAMETHASONE (LOTRISONE) LOTION    Apply 1 application topically 2 (two) times daily as needed (antifungal).    FLUTICASONE (FLONASE) 50 MCG/ACT NASAL SPRAY    Place 1 spray into both nostrils daily.   FUROSEMIDE (LASIX) 40 MG TABLET    Take 40-80 mg by mouth daily. Take 80 mg QAM and 40 mg QPM for CHF   GABAPENTIN (NEURONTIN) 100 MG CAPSULE    Take 1 capsule (100 mg total) by mouth 3 (three) times daily.   HYDROCERIN (EUCERIN) CREA    Apply 1 application topically daily.   INSULIN GLARGINE (LANTUS) 100 UNIT/ML INJECTION    Inject 30 Units into the skin at bedtime.    METOPROLOL TARTRATE (LOPRESSOR) 25 MG TABLET    Take 1 tablet (25 mg total) by mouth 2 (two) times daily.   PANTOPRAZOLE (PROTONIX) 40 MG TABLET    Take 40 mg by mouth daily.    POTASSIUM CHLORIDE (KLOR-CON) 20 MEQ PACKET    Take 20 mEq by mouth 2 (two) times daily.   SIMVASTATIN (ZOCOR) 20 MG TABLET    Take 20 mg by mouth daily.   TERAZOSIN (HYTRIN) 2 MG CAPSULE    Take 4 mg by mouth daily. Take two 2-mg tablets to = 4 mg   TERBINAFINE (LAMISIL) 250 MG TABLET    Take 250 mg by mouth daily. continous   UNABLE TO FIND    Apply 1 application topically 3 (three) times daily. Med Name: Nystatin/zinc/tac 0.1% 1.1.1  Apply to bilateral buttocks for MASD   VITAMIN B-12 (CYANOCOBALAMIN) 1000 MCG TABLET    Take 1,000 mcg by mouth daily.  Modified Medications   No medications on file  Discontinued Medications   No medications on file     Allergies  Allergen Reactions  . Ativan [Lorazepam] Other (See Comments)    Flailing of extremities noted immediately s/p IV administration.  [name] Kitchen Flomax [Tamsulosin Hcl] Other (See Comments)    Excessive tearing  . Glucophage [Metformin Hcl]  Diarrhea     REVIEW OF SYSTEMS:  GENERAL: no change in appetite, no fatigue, no weight changes, no fever, chills or weakness EYES: Denies change in vision, dry eyes, eye pain, itching or discharge EARS: Denies change in hearing, ringing in ears, or earache NOSE: Denies nasal congestion or epistaxis MOUTH and THROAT: Denies oral discomfort, gingival pain  or bleeding, pain from teeth or hoarseness   RESPIRATORY: no cough, SOB, DOE, wheezing, hemoptysis CARDIAC: no chest pain, or palpitations GI: no abdominal pain, diarrhea, constipation, heart burn, nausea or vomiting GU: Denies dysuria, frequency, hematuria, incontinence, or discharge PSYCHIATRIC: Denies feeling of depression or anxiety. No report of hallucinations, insomnia, paranoia, or agitation     PHYSICAL EXAMINATION  GENERAL APPEARANCE: Well nourished. In no acute distress. Obese SKIN:  Bilateral lower extremity noted with redness, right inner buttock stage 2 pressure ulcer HEAD: Normal in size and contour. No evidence of trauma EYES: Lids open and close normally. No blepharitis, entropion or ectropion. PERRL. Conjunctivae are clear and sclerae are white. Lenses are without opacity EARS: Pinnae are normal. Patient hears normal voice tunes of the examiner MOUTH and THROAT: Lips are without lesions. Oral mucosa is moist and without lesions. Tongue is normal in shape, size, and color and without lesions NECK: supple, trachea midline, no neck masses, no thyroid tenderness, no thyromegaly LYMPHATICS: no LAN in the neck, no supraclavicular LAN RESPIRATORY: breathing is even & unlabored, BS CTAB, O2 @ 2L/min via [place] CARDIAC: RRR, no murmur,no extra heart sounds, BLE 1+ edema GI: abdomen soft, normal BS, no masses, no tenderness, no hepatomegaly, no splenomegaly EXTREMITIES:  Able to move X 4 extremities, left-sided weakness NEURO:  Slurred speech PSYCHIATRIC: Alert to self, disoriented to time and place. Affect and behavior are  appropriate   LABS/RADIOLOGY: Labs reviewed: Basic Metabolic Panel:  Recent Labs  01/07/17 0828 01/08/17 1052 01/08/17 1842 01/09/17 0901 01/15/17 01/19/17 01/24/17  NA 139 140  --  140 137 139 135*  K 3.5 3.4* 3.6 3.8 4.0 3.7 4.1  CL 96* 97*  --  97*  --   --   --   CO2 30 31  --  32  --   --   --   GLUCOSE 193* 324*  --  310*  --   --   --   BUN 16 17  --  15 18 12 15   CREATININE 1.24 1.20  --  1.14 1.4* 1.4* 1.7*  CALCIUM 9.2 9.5  --  9.6  --   --   --   MG  --   --  1.9  --   --   --   --    Liver Function Tests:  Recent Labs  12/09/16 0528 01/01/17 1507  AST 20 24  ALT 21 19  ALKPHOS 68 68  BILITOT 0.4 0.7  PROT 6.7 5.9*  ALBUMIN 3.0* 2.9*    Recent Labs  01/01/17 1507  AMMONIA 19   CBC:  Recent Labs  01/01/17 1507  01/07/17 0828 01/08/17 1052 01/09/17 0901 01/15/17 01/19/17 01/24/17  WBC 9.5  < > 12.2* 12.2* 12.6* 8.5 6.8 7.8  NEUTROABS 6.7  --   --   --   --   --  5 5  HGB 10.6*  < > 12.5* 12.5* 12.8* 12.5* 12.5* 12.9*  HCT 35.6*  < > 39.9 39.9 40.9 42 42 43  MCV 69.4*  < > 67.7* 67.4* 68.1*  --   --   --   PLT 164  < > 142* 190 195 271 223 199  < > = values in this interval not displayed.  Lipid Panel:  Recent Labs  01/02/17 0237  HDL 28*   CBG:  Recent Labs  01/09/17 1115 01/09/17 1622 01/09/17 2044  GLUCAP 254* 237* 230*      Ct Angio Head W Or  Wo Contrast  Result Date: 01/03/2017 CLINICAL DATA:  Stroke EXAM: CT ANGIOGRAPHY HEAD AND NECK TECHNIQUE: Multidetector CT imaging of the head and neck was performed using the standard protocol during bolus administration of intravenous contrast. Multiplanar CT image reconstructions and MIPs were obtained to evaluate the vascular anatomy. Carotid stenosis measurements (when applicable) are obtained utilizing NASCET criteria, using the distal internal carotid diameter as the denominator. COMPARISON:  Brain MRI 01/01/2017 FINDINGS: CT HEAD FINDINGS Brain: No mass lesion, intraparenchymal  hemorrhage or extra-axial collection. No evidence of acute cortical infarct. There is periventricular hypoattenuation compatible with chronic microvascular disease. Old right cerebellar infarct. Skull: Normal visualized skull base, calvarium and extracranial soft tissues. Sinuses/Orbits: No sinus fluid levels or advanced mucosal thickening. No mastoid effusion. Normal orbits. CTA NECK FINDINGS Aortic arch: There is no aneurysm or dissection of the visualized ascending aorta or aortic arch. There is a normal 3 vessel branching pattern. The visualized proximal subclavian arteries are normal. There is atherosclerotic calcification within the aortic arch. Right carotid system: The right common carotid origin is widely patent. There is no common carotid or internal carotid artery dissection or aneurysm. No hemodynamically significant stenosis. There is mild atherosclerotic calcification at the right carotid bifurcation. Left carotid system: The left common carotid origin is widely patent. There is no common carotid or internal carotid artery dissection or aneurysm. No hemodynamically significant stenosis. Vertebral arteries: The vertebral system is left dominant. There is atherosclerotic calcification of the left vertebral artery origin, which remains widely patent. Both vertebral arteries are normal to their confluence with the basilar artery. Skeleton: There is no bony spinal canal stenosis. No lytic or blastic lesions. Other neck: The nasopharynx is clear. The oropharynx and hypopharynx are normal. The epiglottis is normal. The supraglottic larynx, glottis and subglottic larynx are normal. No retropharyngeal collection. The parapharyngeal spaces are preserved. The parotid and submandibular glands are normal. No sialolithiasis or salivary ductal dilatation. The thyroid gland is normal. There is no cervical lymphadenopathy. Upper chest: No pneumothorax or pleural effusion. No nodules or masses. Lingular atelectasis.  Review of the MIP images confirms the above findings CTA HEAD FINDINGS Anterior circulation: --Intracranial internal carotid arteries: There is atherosclerotic calcification of the right cavernous and left cavernous and clinoid segments without hemodynamically significant stenosis. --Anterior cerebral arteries: Normal. --Middle cerebral arteries: Normal. --Posterior communicating arteries: Absent bilaterally. Posterior circulation: --Posterior cerebral arteries: Normal. --Superior cerebellar arteries: Normal. --Basilar artery: Normal. --Anterior inferior cerebellar arteries: Normal. --Posterior inferior cerebellar arteries: Normal. Venous sinuses: As permitted by contrast timing, patent. Anatomic variants: None Delayed phase: No parenchymal contrast enhancement. Review of the MIP images confirms the above findings IMPRESSION: 1. No CT evidence of acute intracranial abnormality. The small infarct seen on the prior MRI is not visible on this study. 2. No occlusion or hemodynamically significant stenosis of the intracranial or cervical arteries. 3. Aortic atherosclerosis. Electronically Signed   By: Ulyses Jarred M.D.   On: 01/03/2017 13:44   Dg Chest 2 View  Result Date: 01/01/2017 CLINICAL DATA:  Altered mental status EXAM: CHEST  2 VIEW COMPARISON:  12/10/2016 FINDINGS: Cardiomegaly again noted. Stable chronic elevation of left hemidiaphragm and prominent fat pad in left cardiophrenic angle. There is linear atelectasis or infiltrate in lingula. No pulmonary edema. Right lung is clear IMPRESSION: Stable chronic elevation of left hemidiaphragm and prominent fat pad in left cardiophrenic angle. There is linear atelectasis or infiltrate in lingula. No pulmonary edema. Right lung is clear Electronically Signed   By: Lahoma Crocker  M.D.   On: 01/01/2017 16:25   Ct Head Wo Contrast  Result Date: 01/01/2017 CLINICAL DATA:  Pt has very altered mental status talking in undistinguished words and wavings arms around EXAM:  CT HEAD WITHOUT CONTRAST CT CERVICAL SPINE WITHOUT CONTRAST TECHNIQUE: Multidetector CT imaging of the head and cervical spine was performed following the standard protocol without intravenous contrast. Multiplanar CT image reconstructions of the cervical spine were also generated. COMPARISON:  03/14/2015 and 03/15/2015 FINDINGS: CT HEAD FINDINGS Brain: There is moderate central and cortical atrophy. Periventricular white matter changes are consistent with small vessel disease. Stable appearance of subdural hygroma in the posterior fossa. Remote right cerebellar infarct. There is no intra or extra-axial mass lesion. The basilar cisterns and ventricles have a normal appearance. There is no CT evidence for acute infarction or hemorrhage. Vascular: There is atherosclerotic calcification of the carotid siphons. Skull: No calvarial fracture. Sinuses/Orbits: No acute finding. Other: None CT CERVICAL SPINE FINDINGS Alignment: There is significant degenerative changes in mid and lower cervical spine. Degenerative anterolisthesis of C5 on C6 measures approximately 4 mm. Skull base and vertebrae: No acute fracture. No primary bone lesion or focal pathologic process. Soft tissues and spinal canal: No prevertebral fluid or swelling. No visible canal hematoma. Disc levels: Significant disc height loss throughout the cervical spine. Bilateral foraminal narrowing at C3-4. Bilateral foraminal narrowing at C4-5. Bilateral foraminal narrowing at C6-7. Significant facet hypertrophy at all levels. Upper chest: Unremarkable. Other: Study quality is degraded by patient motion artifact. IMPRESSION: 1. Atrophy and small vessel disease. 2. 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 3. Remote lacunar infarct of the right cerebellar hemisphere. 4.  No evidence for acute CT abnormality. 5. Significant cervical spine degenerative changes without evidence for acute abnormality. Electronically Signed   By: Nolon Nations M.D.   On:  01/01/2017 16:10   Ct Angio Neck W Or Wo Contrast  Result Date: 01/03/2017 CLINICAL DATA:  Stroke EXAM: CT ANGIOGRAPHY HEAD AND NECK TECHNIQUE: Multidetector CT imaging of the head and neck was performed using the standard protocol during bolus administration of intravenous contrast. Multiplanar CT image reconstructions and MIPs were obtained to evaluate the vascular anatomy. Carotid stenosis measurements (when applicable) are obtained utilizing NASCET criteria, using the distal internal carotid diameter as the denominator. COMPARISON:  Brain MRI 01/01/2017 FINDINGS: CT HEAD FINDINGS Brain: No mass lesion, intraparenchymal hemorrhage or extra-axial collection. No evidence of acute cortical infarct. There is periventricular hypoattenuation compatible with chronic microvascular disease. Old right cerebellar infarct. Skull: Normal visualized skull base, calvarium and extracranial soft tissues. Sinuses/Orbits: No sinus fluid levels or advanced mucosal thickening. No mastoid effusion. Normal orbits. CTA NECK FINDINGS Aortic arch: There is no aneurysm or dissection of the visualized ascending aorta or aortic arch. There is a normal 3 vessel branching pattern. The visualized proximal subclavian arteries are normal. There is atherosclerotic calcification within the aortic arch. Right carotid system: The right common carotid origin is widely patent. There is no common carotid or internal carotid artery dissection or aneurysm. No hemodynamically significant stenosis. There is mild atherosclerotic calcification at the right carotid bifurcation. Left carotid system: The left common carotid origin is widely patent. There is no common carotid or internal carotid artery dissection or aneurysm. No hemodynamically significant stenosis. Vertebral arteries: The vertebral system is left dominant. There is atherosclerotic calcification of the left vertebral artery origin, which remains widely patent. Both vertebral arteries are  normal to their confluence with the basilar artery. Skeleton: There is no bony spinal  canal stenosis. No lytic or blastic lesions. Other neck: The nasopharynx is clear. The oropharynx and hypopharynx are normal. The epiglottis is normal. The supraglottic larynx, glottis and subglottic larynx are normal. No retropharyngeal collection. The parapharyngeal spaces are preserved. The parotid and submandibular glands are normal. No sialolithiasis or salivary ductal dilatation. The thyroid gland is normal. There is no cervical lymphadenopathy. Upper chest: No pneumothorax or pleural effusion. No nodules or masses. Lingular atelectasis. Review of the MIP images confirms the above findings CTA HEAD FINDINGS Anterior circulation: --Intracranial internal carotid arteries: There is atherosclerotic calcification of the right cavernous and left cavernous and clinoid segments without hemodynamically significant stenosis. --Anterior cerebral arteries: Normal. --Middle cerebral arteries: Normal. --Posterior communicating arteries: Absent bilaterally. Posterior circulation: --Posterior cerebral arteries: Normal. --Superior cerebellar arteries: Normal. --Basilar artery: Normal. --Anterior inferior cerebellar arteries: Normal. --Posterior inferior cerebellar arteries: Normal. Venous sinuses: As permitted by contrast timing, patent. Anatomic variants: None Delayed phase: No parenchymal contrast enhancement. Review of the MIP images confirms the above findings IMPRESSION: 1. No CT evidence of acute intracranial abnormality. The small infarct seen on the prior MRI is not visible on this study. 2. No occlusion or hemodynamically significant stenosis of the intracranial or cervical arteries. 3. Aortic atherosclerosis. Electronically Signed   By: Ulyses Jarred M.D.   On: 01/03/2017 13:44   Ct Cervical Spine Wo Contrast  Result Date: 01/01/2017 CLINICAL DATA:  Pt has very altered mental status talking in undistinguished words and wavings  arms around EXAM: CT HEAD WITHOUT CONTRAST CT CERVICAL SPINE WITHOUT CONTRAST TECHNIQUE: Multidetector CT imaging of the head and cervical spine was performed following the standard protocol without intravenous contrast. Multiplanar CT image reconstructions of the cervical spine were also generated. COMPARISON:  03/14/2015 and 03/15/2015 FINDINGS: CT HEAD FINDINGS Brain: There is moderate central and cortical atrophy. Periventricular white matter changes are consistent with small vessel disease. Stable appearance of subdural hygroma in the posterior fossa. Remote right cerebellar infarct. There is no intra or extra-axial mass lesion. The basilar cisterns and ventricles have a normal appearance. There is no CT evidence for acute infarction or hemorrhage. Vascular: There is atherosclerotic calcification of the carotid siphons. Skull: No calvarial fracture. Sinuses/Orbits: No acute finding. Other: None CT CERVICAL SPINE FINDINGS Alignment: There is significant degenerative changes in mid and lower cervical spine. Degenerative anterolisthesis of C5 on C6 measures approximately 4 mm. Skull base and vertebrae: No acute fracture. No primary bone lesion or focal pathologic process. Soft tissues and spinal canal: No prevertebral fluid or swelling. No visible canal hematoma. Disc levels: Significant disc height loss throughout the cervical spine. Bilateral foraminal narrowing at C3-4. Bilateral foraminal narrowing at C4-5. Bilateral foraminal narrowing at C6-7. Significant facet hypertrophy at all levels. Upper chest: Unremarkable. Other: Study quality is degraded by patient motion artifact. IMPRESSION: 1. Atrophy and small vessel disease. 2. 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 3. Remote lacunar infarct of the right cerebellar hemisphere. 4.  No evidence for acute CT abnormality. 5. Significant cervical spine degenerative changes without evidence for acute abnormality. Electronically Signed   By: Nolon Nations  M.D.   On: 01/01/2017 16:10   Mr Brain Wo Contrast  Result Date: 01/01/2017 CLINICAL DATA:  Initial evaluation for acute slurred speech, progressive confusion. EXAM: MRI HEAD WITHOUT CONTRAST TECHNIQUE: Multiplanar, multiecho pulse sequences of the brain and surrounding structures were obtained without intravenous contrast. COMPARISON:  Prior CT from earlier the same day. FINDINGS: Brain: The study is limited as the patient was unable  to tolerate the full length of the exam. Additionally, images provided are moderately degraded by motion artifact. Diffuse prominence of the CSF containing spaces is compatible with generalized age-related cerebral atrophy. Patchy T2/FLAIR hyperintensity within the periventricular and deep white matter both cerebral hemispheres most consistent with chronic microvascular ischemic disease, fairly advanced in nature. Chronic microvascular ischemic changes present within the pons. Remote bilateral cerebellar infarcts noted. Additional focus of cortical encephalomalacia within the right parietal lobe compatible with remote ischemic infarct as well. There is a punctate 5-6 mm focus of diffusion abnormality within the right periatrial white matter (series 3, image 31), suspicious for a small acute/ early subacute small vessel ischemic infarct. No associated mass effect. No definite associated hemorrhage, although evaluation for blood products limited on this exam. No other abnormal foci of restricted diffusion to suggest acute or subacute ischemia. Gray-white matter differentiation otherwise maintained. No mass lesion, midline shift, or mass effect. Ventricular prominence related to global parenchymal volume loss without hydrocephalus. No extra-axial fluid collection. Major dural sinuses are grossly patent. Vascular: Major intracranial vascular flow voids are maintained. Right vertebral artery hypoplastic. Skull and upper cervical spine: Craniocervical junction grossly unremarkable,  although not well evaluated on this limited exam. Scalp soft tissues unremarkable. Calvarium intact. Bone marrow signal intensity grossly normal. Sinuses/Orbits: Globes and orbital soft tissues within normal limits. Paranasal sinuses are largely clear. Small bilateral mastoid effusions, left greater than right. Inner ear structures grossly normal. IMPRESSION: 1. Limited study due to patient's inability to tolerate the full length of the exam and motion artifact. 2. 5-6 mm focus of diffusion abnormality within the right periatrial white matter, suspicious for a small acute/early subacute small vessel ischemic infarct. 3. No other acute intracranial process identified. 4. Advanced cerebral atrophy with chronic microvascular ischemic disease and scattered remote ischemic infarcts as above. Electronically Signed   By: Jeannine Boga M.D.   On: 01/01/2017 23:17   Dg Abd Portable 1v  Result Date: 01/07/2017 CLINICAL DATA:  Abdominal pain EXAM: PORTABLE ABDOMEN - 1 VIEW COMPARISON:  CT abdomen and pelvis Apr 04, 2013 FINDINGS: There is no bowel dilatation or air-fluid level suggesting bowel obstruction. No free air. There is aortic atherosclerosis. IMPRESSION: No bowel obstruction or free air evident.  Aortic atherosclerosis. Electronically Signed   By: Lowella Grip III M.D.   On: 01/07/2017 13:23    ASSESSMENT/PLAN:  Generalized weakness - for Home health PT and OT, for therapeutic strengthening exercises; fall precautions  History of stroke - continue Plavix 75 mg 1 tab by mouth daily, aspirin EC 81 mg 1 tab by mouth daily and Simvastatin 20 mg Q D  C- Difficile colitis -  Completed course of Vancomycin ; resolved  Chronic respiratory failure with hypoxia - continue O2 at 3 L/minute via [place]  Chronic diastolic heart failure - with latest creatinine of 1.7, will decrease from Lasix  80 mg Q AM and 40 mg 1 tab by mouth Q HS, will decrease to Lasix 40 mg Q D   Lower extremity cellulitis -  cefepime and doxycycline were discontinued; lower extremity redness impoved; resolved  Diabetes mellitus, type II - recently increased Lantus 100 units/mL from 28 units to 30 units subcutaneous daily, added Humalog sliding scale SQ BID and CBG twice a day Lab Results  Component Value Date   HGBA1C 8.6 (H) 01/02/2017   Hypertension - continue metoprolol tartrate 25 mg 1 tab by mouth twice a day  Hypokalemia - decrease Klor-Con 20 meq from BID to Q  D Lab Results  Component Value Date   K 4.1 01/24/2017   Neuropathy - continue gabapentin 100 mg 1 capsule by mouth 3 times a day   Allergic rhinitis - tinea Flonase 50 g 1 spray into both nostrils daily  Hyperlipidemia - continue simvastatin 20 mg 1 tab by mouth daily Lab Results  Component Value Date   CHOL 130 01/02/2017   HDL 28 (L) 01/02/2017   LDLCALC 76 01/02/2017   TRIG 130 01/02/2017   CHOLHDL 4.6 01/02/2017   BPH - continue Terazosin 2 mg give 2 capsules = 4 mg by mouth daily  Onychomycosis - continue terbinafine 250 mg 1 tab by mouth daily  Depression - mood is stable; continue amitriptyline 25 mg 1 tab by mouth daily at Okay bedtime  GERD - continue pantoprazole 40 mg 1 tab by mouth daily  Chronic kidney disease, stage III - decrease Lasix to 40 mg daily and repeat CBC in 1 week; follow-up with PCP Lab Results  Component Value Date   CREATININE 1.7 (A) 01/24/2017   PAF - rate controlled; continue metoprolol     I have filled out patient's discharge paperwork and written prescriptions.  Patient will receive home health PT, OT, ST, Nursing and CNA.  DME provided:  None  Total discharge time: Greater than 30 minutes Greater than 50% was spent in counseling and coordination of care with the patient.   Discharge time involved coordination of the discharge process with social worker, nursing staff and therapy department. Medical justification for home health services verified.    Delrick Dehart C. [place] - NP    [employer] [phone]

## 2017-01-30 ENCOUNTER — Other Ambulatory Visit: Payer: Self-pay | Admitting: *Deleted

## 2017-01-30 NOTE — Patient Outreach (Addendum)
[facility]) Care Management  01/30/2017  BRODIE CORRELL 1930/12/20 [id number]   Phone call to patient's daughter in law Alice Vitelli who confirms that due to the weather patient will transition to [employer].  Patient will discharge with [facility] through [place].   Plan: This Education officer, museum will confirm patient's transition to [facility] by the end of the week.    [name] [facility] Care Management [phone]

## 2017-02-02 ENCOUNTER — Other Ambulatory Visit: Payer: Self-pay | Admitting: *Deleted

## 2017-02-02 NOTE — Patient Outreach (Signed)
[facility] [place] [facility]) Care Management  02/02/2017  Jared Hall [birth year] [id number]    Phone call to patient's daughter in law [name] who confirmed that patient moved to [place] on 01/30/17. Patient seems to have settled in nicely once select items from his home was brought over.  She is not sure however if patient will be receiving [place] services there or not.  Phone call to [facility], left message with [name] the social worker to confirm this.    Plan:  This Education officer, museum will await phone call from the social worker at [employer].   [name] [facility] Care Management [phone]

## 2017-02-05 ENCOUNTER — Other Ambulatory Visit: Payer: Self-pay | Admitting: *Deleted

## 2017-02-05 ENCOUNTER — Encounter: Payer: Self-pay | Admitting: *Deleted

## 2017-02-05 NOTE — Patient Outreach (Signed)
[facility]) Care Management  02/05/2017  [name] 03/06/1931 [id number]    Phone call to patient's daughter in law Jared Hall who was also able to confirm that Jared Hall will  be providing in house PT and OT services for patient as well as mediction management. This Education officer, museum informed patient's daughter in law that due to patient's transition to an Assisted Living patient will be closed to [facility] care management. Patient's daughter in law encouraged to contact this social worker if any further issues or concerns.   [name] [facility] Care Management [phone]

## 2017-02-05 NOTE — Patient Outreach (Signed)
[facility]) Care Management  02/05/2017  Jared Hall 08-10-1931 [id number]   Phone call to [place] confirmed with Jared Hall that patient has officially moved in.  Patient will receive [place] services there as well as medication management, transportation to medical appointments and meals. Patient to be closed to [place] work at this time as patient has now transitioned to long term care.   Plan: This Education officer, museum to inform patient's provider's office office of case closure.           This Education officer, museum to inform RNCM Reginia Naas of case closure.    Jared Hall [facility] Care Management [phone]

## 2017-02-06 DIAGNOSIS — I2510 Atherosclerotic heart disease of native coronary artery without angina pectoris: Secondary | ICD-10-CM | POA: Diagnosis not present

## 2017-02-06 DIAGNOSIS — N183 Chronic kidney disease, stage 3 (moderate): Secondary | ICD-10-CM | POA: Diagnosis not present

## 2017-02-06 DIAGNOSIS — I5042 Chronic combined systolic (congestive) and diastolic (congestive) heart failure: Secondary | ICD-10-CM | POA: Diagnosis not present

## 2017-02-06 DIAGNOSIS — Z9181 History of falling: Secondary | ICD-10-CM | POA: Diagnosis not present

## 2017-02-06 DIAGNOSIS — Z8546 Personal history of malignant neoplasm of prostate: Secondary | ICD-10-CM | POA: Diagnosis not present

## 2017-02-06 DIAGNOSIS — I8390 Asymptomatic varicose veins of unspecified lower extremity: Secondary | ICD-10-CM | POA: Diagnosis not present

## 2017-02-06 DIAGNOSIS — J9612 Chronic respiratory failure with hypercapnia: Secondary | ICD-10-CM | POA: Diagnosis not present

## 2017-02-06 DIAGNOSIS — Z7982 Long term (current) use of aspirin: Secondary | ICD-10-CM | POA: Diagnosis not present

## 2017-02-06 DIAGNOSIS — I69320 Aphasia following cerebral infarction: Secondary | ICD-10-CM | POA: Diagnosis not present

## 2017-02-06 DIAGNOSIS — E1122 Type 2 diabetes mellitus with diabetic chronic kidney disease: Secondary | ICD-10-CM | POA: Diagnosis not present

## 2017-02-06 DIAGNOSIS — E1142 Type 2 diabetes mellitus with diabetic polyneuropathy: Secondary | ICD-10-CM | POA: Diagnosis not present

## 2017-02-06 DIAGNOSIS — G4733 Obstructive sleep apnea (adult) (pediatric): Secondary | ICD-10-CM | POA: Diagnosis not present

## 2017-02-06 DIAGNOSIS — I480 Paroxysmal atrial fibrillation: Secondary | ICD-10-CM | POA: Diagnosis not present

## 2017-02-06 DIAGNOSIS — I69322 Dysarthria following cerebral infarction: Secondary | ICD-10-CM | POA: Diagnosis not present

## 2017-02-06 DIAGNOSIS — K219 Gastro-esophageal reflux disease without esophagitis: Secondary | ICD-10-CM | POA: Diagnosis not present

## 2017-02-06 DIAGNOSIS — M179 Osteoarthritis of knee, unspecified: Secondary | ICD-10-CM | POA: Diagnosis not present

## 2017-02-06 DIAGNOSIS — Z794 Long term (current) use of insulin: Secondary | ICD-10-CM | POA: Diagnosis not present

## 2017-02-06 DIAGNOSIS — Z7902 Long term (current) use of antithrombotics/antiplatelets: Secondary | ICD-10-CM | POA: Diagnosis not present

## 2017-02-06 DIAGNOSIS — I130 Hypertensive heart and chronic kidney disease with heart failure and stage 1 through stage 4 chronic kidney disease, or unspecified chronic kidney disease: Secondary | ICD-10-CM | POA: Diagnosis not present

## 2017-02-06 DIAGNOSIS — F322 Major depressive disorder, single episode, severe without psychotic features: Secondary | ICD-10-CM | POA: Diagnosis not present

## 2017-02-06 DIAGNOSIS — E785 Hyperlipidemia, unspecified: Secondary | ICD-10-CM | POA: Diagnosis not present

## 2017-02-06 DIAGNOSIS — Z9981 Dependence on supplemental oxygen: Secondary | ICD-10-CM | POA: Diagnosis not present

## 2017-02-06 DIAGNOSIS — D649 Anemia, unspecified: Secondary | ICD-10-CM | POA: Diagnosis not present

## 2017-02-09 DIAGNOSIS — E1165 Type 2 diabetes mellitus with hyperglycemia: Secondary | ICD-10-CM | POA: Diagnosis not present

## 2017-02-09 DIAGNOSIS — Z794 Long term (current) use of insulin: Secondary | ICD-10-CM | POA: Diagnosis not present

## 2017-02-12 DIAGNOSIS — E1142 Type 2 diabetes mellitus with diabetic polyneuropathy: Secondary | ICD-10-CM | POA: Diagnosis not present

## 2017-02-12 DIAGNOSIS — I130 Hypertensive heart and chronic kidney disease with heart failure and stage 1 through stage 4 chronic kidney disease, or unspecified chronic kidney disease: Secondary | ICD-10-CM | POA: Diagnosis not present

## 2017-02-12 DIAGNOSIS — E1122 Type 2 diabetes mellitus with diabetic chronic kidney disease: Secondary | ICD-10-CM | POA: Diagnosis not present

## 2017-02-12 DIAGNOSIS — I69322 Dysarthria following cerebral infarction: Secondary | ICD-10-CM | POA: Diagnosis not present

## 2017-02-12 DIAGNOSIS — N183 Chronic kidney disease, stage 3 (moderate): Secondary | ICD-10-CM | POA: Diagnosis not present

## 2017-02-12 DIAGNOSIS — I5042 Chronic combined systolic (congestive) and diastolic (congestive) heart failure: Secondary | ICD-10-CM | POA: Diagnosis not present

## 2017-02-13 ENCOUNTER — Emergency Department (HOSPITAL_COMMUNITY): Payer: Medicare Other

## 2017-02-13 ENCOUNTER — Emergency Department (HOSPITAL_COMMUNITY)
Admission: EM | Admit: 2017-02-13 | Discharge: 2017-02-13 | Disposition: A | Discharge status: Home / Self Care | Payer: Medicare Other | Attending: Emergency Medicine | Admitting: Emergency Medicine

## 2017-02-13 ENCOUNTER — Encounter (HOSPITAL_COMMUNITY): Payer: Self-pay | Admitting: Emergency Medicine

## 2017-02-13 DIAGNOSIS — S0081XA Abrasion of other part of head, initial encounter: Secondary | ICD-10-CM | POA: Diagnosis not present

## 2017-02-13 DIAGNOSIS — S0091XA Abrasion of unspecified part of head, initial encounter: Secondary | ICD-10-CM | POA: Diagnosis not present

## 2017-02-13 DIAGNOSIS — E1142 Type 2 diabetes mellitus with diabetic polyneuropathy: Secondary | ICD-10-CM | POA: Insufficient documentation

## 2017-02-13 DIAGNOSIS — I509 Heart failure, unspecified: Secondary | ICD-10-CM | POA: Insufficient documentation

## 2017-02-13 DIAGNOSIS — I629 Nontraumatic intracranial hemorrhage, unspecified: Secondary | ICD-10-CM | POA: Diagnosis not present

## 2017-02-13 DIAGNOSIS — I130 Hypertensive heart and chronic kidney disease with heart failure and stage 1 through stage 4 chronic kidney disease, or unspecified chronic kidney disease: Secondary | ICD-10-CM | POA: Diagnosis not present

## 2017-02-13 DIAGNOSIS — Z8546 Personal history of malignant neoplasm of prostate: Secondary | ICD-10-CM | POA: Insufficient documentation

## 2017-02-13 DIAGNOSIS — Z79899 Other long term (current) drug therapy: Secondary | ICD-10-CM | POA: Diagnosis not present

## 2017-02-13 DIAGNOSIS — E1122 Type 2 diabetes mellitus with diabetic chronic kidney disease: Secondary | ICD-10-CM | POA: Diagnosis not present

## 2017-02-13 DIAGNOSIS — S0990XA Unspecified injury of head, initial encounter: Secondary | ICD-10-CM | POA: Diagnosis not present

## 2017-02-13 DIAGNOSIS — N183 Chronic kidney disease, stage 3 (moderate): Secondary | ICD-10-CM | POA: Insufficient documentation

## 2017-02-13 DIAGNOSIS — Y939 Activity, unspecified: Secondary | ICD-10-CM | POA: Diagnosis not present

## 2017-02-13 DIAGNOSIS — Y999 Unspecified external cause status: Secondary | ICD-10-CM | POA: Diagnosis not present

## 2017-02-13 DIAGNOSIS — Z7982 Long term (current) use of aspirin: Secondary | ICD-10-CM | POA: Diagnosis not present

## 2017-02-13 DIAGNOSIS — E1165 Type 2 diabetes mellitus with hyperglycemia: Secondary | ICD-10-CM | POA: Diagnosis not present

## 2017-02-13 DIAGNOSIS — Z794 Long term (current) use of insulin: Secondary | ICD-10-CM | POA: Insufficient documentation

## 2017-02-13 DIAGNOSIS — Z8673 Personal history of transient ischemic attack (TIA), and cerebral infarction without residual deficits: Secondary | ICD-10-CM | POA: Diagnosis not present

## 2017-02-13 DIAGNOSIS — I10 Essential (primary) hypertension: Secondary | ICD-10-CM | POA: Diagnosis not present

## 2017-02-13 DIAGNOSIS — I5042 Chronic combined systolic (congestive) and diastolic (congestive) heart failure: Secondary | ICD-10-CM | POA: Diagnosis not present

## 2017-02-13 DIAGNOSIS — W19XXXA Unspecified fall, initial encounter: Secondary | ICD-10-CM

## 2017-02-13 DIAGNOSIS — I69322 Dysarthria following cerebral infarction: Secondary | ICD-10-CM | POA: Diagnosis not present

## 2017-02-13 DIAGNOSIS — T148XXA Other injury of unspecified body region, initial encounter: Secondary | ICD-10-CM

## 2017-02-13 DIAGNOSIS — Y929 Unspecified place or not applicable: Secondary | ICD-10-CM | POA: Insufficient documentation

## 2017-02-13 DIAGNOSIS — R739 Hyperglycemia, unspecified: Secondary | ICD-10-CM

## 2017-02-13 DIAGNOSIS — S0191XA Laceration without foreign body of unspecified part of head, initial encounter: Secondary | ICD-10-CM | POA: Diagnosis not present

## 2017-02-13 LAB — BASIC METABOLIC PANEL
Anion gap: 10 (ref 5–15)
BUN: 10 mg/dL (ref 6–20)
CALCIUM: 9.2 mg/dL (ref 8.9–10.3)
CHLORIDE: 92 mmol/L — AB (ref 101–111)
CO2: 35 mmol/L — ABNORMAL HIGH (ref 22–32)
CREATININE: 1.43 mg/dL — AB (ref 0.61–1.24)
GFR calc Af Amer: 50 mL/min — ABNORMAL LOW (ref >60)
GFR calc non Af Amer: 43 mL/min — ABNORMAL LOW (ref >60)
Glucose, Bld: 296 mg/dL — ABNORMAL HIGH (ref 65–99)
Potassium: 3.6 mmol/L (ref 3.5–5.1)
SODIUM: 137 mmol/L (ref 135–145)

## 2017-02-13 LAB — URINALYSIS, ROUTINE W REFLEX MICROSCOPIC
Bacteria, UA: NONE SEEN
Bilirubin Urine: NEGATIVE
Glucose, UA: >=500 mg/dL — AB
Hgb urine dipstick: NEGATIVE
Ketones, ur: NEGATIVE mg/dL
Leukocytes, UA: NEGATIVE
Nitrite: NEGATIVE
Protein, ur: NEGATIVE mg/dL
RBC / HPF: NONE SEEN RBC/hpf (ref 0–5)
SPECIFIC GRAVITY, URINE: 1.008 (ref 1.005–1.030)
pH: 6 (ref 5.0–8.0)

## 2017-02-13 LAB — CBC WITH DIFFERENTIAL/PLATELET
BASOS PCT: 0 %
Basophils Absolute: 0 10*3/uL (ref 0.0–0.1)
EOS ABS: 0.4 10*3/uL (ref 0.0–0.7)
Eosinophils Relative: 4 %
HCT: 37.9 % — ABNORMAL LOW (ref 39.0–52.0)
HEMOGLOBIN: 11.4 g/dL — AB (ref 13.0–17.0)
LYMPHS ABS: 2 10*3/uL (ref 0.7–4.0)
LYMPHS PCT: 20 %
MCH: 20.7 pg — AB (ref 26.0–34.0)
MCHC: 30.1 g/dL (ref 30.0–36.0)
MCV: 68.8 fL — ABNORMAL LOW (ref 78.0–100.0)
Monocytes Absolute: 0.9 10*3/uL (ref 0.1–1.0)
Monocytes Relative: 9 %
NEUTROS ABS: 6.5 10*3/uL (ref 1.7–7.7)
Neutrophils Relative %: 67 %
Platelets: 225 10*3/uL (ref 150–400)
RBC: 5.51 MIL/uL (ref 4.22–5.81)
RDW: 15.5 % (ref 11.5–15.5)
WBC: 9.8 10*3/uL (ref 4.0–10.5)

## 2017-02-13 LAB — CBG MONITORING, ED
GLUCOSE-CAPILLARY: 251 mg/dL — AB (ref 65–99)
GLUCOSE-CAPILLARY: 203 mg/dL — AB (ref 65–99)

## 2017-02-13 NOTE — ED Notes (Signed)
[facility] called to return patient to [facility] , message left with son that patient would be returning. Called [facility] and spoke with [facility] aware patient will be returning

## 2017-02-13 NOTE — ED Notes (Signed)
Patient resting on stretcher daughter in law at bedside. Status of patient updated . Daughter will be leaving

## 2017-02-13 NOTE — ED Triage Notes (Addendum)
Pt in from [facility] via [facility] after unwitnessed fall from [facility], takes Plavix. Pt states he was on the floor x 4 hrs at facility, crawled to door and called for help. CBG was 404, and facility gave 10U Lantus. Pt denies pain, has small L posterior head abrasion. A&ox2

## 2017-02-13 NOTE — ED Notes (Signed)
Patient remains in mri  

## 2017-02-13 NOTE — ED Notes (Signed)
Trasported to MRI

## 2017-02-13 NOTE — ED Notes (Signed)
Checked patient blood sugar it was 203 notified RN Claiborne Billings of cbg

## 2017-02-13 NOTE — ED Notes (Signed)
Pt is in stable condition upon d/c and is escorted back to [facility] via [place].

## 2017-02-13 NOTE — ED Provider Notes (Signed)
[facility] DEPT Provider Note   CSN: [id number] Arrival date & time: 02/13/17  0608     History   Chief Complaint Chief Complaint  Patient presents with  . Fall  . Hyperglycemia    HPI Jared Hall is a 83 y.o. male.  HPI   Reports last night stood up and was scratching head when lost balance and fell out of wheelchair.  Was down for about 5 hours at the [facility] before they found him. He is on plavix. No recent illness, fevers, chills, cough, nausea/vomiting.  Typically uses wheelchair. Denies LOC/lightheadedness. Had been eating and drinking normally. Last fall was 42mos ago.  [facility] taking care of ankles. DM took lantus. Had hyperglycemia at facility and was given 10U of lantus.   Past Medical History:  Diagnosis Date  . Alpha thalassemia ([place])   . Anemia 01/01/2017  . BPH (benign prostatic hyperplasia)   . CHF (congestive heart failure) ([place])   . High cholesterol   . Hypertension   . Lower extremity edema   . Onychomycosis   . Prostate cancer ([place])    S/P "8 weeks of radiation"  . Prostatitis    recurrent  . Sleep apnea   . Stroke [facility]) ~ 2005   denies residual on 2/123/2015  . Type II diabetes mellitus ([place])   . Umbilical hernia   . Urinary urgency    with incontinence    Patient Active Problem List   Diagnosis Date Noted  . Acute encephalopathy   . Acute ischemic stroke ([place])   . Generalized weakness   . Cerebrovascular disease   . Altered mental status   . Acute respiratory failure with hypercapnia ([place])   . CVA (cerebral vascular accident) ([place]) 01/01/2017  . Dehydration 01/01/2017  . Anemia 01/01/2017  . Acute respiratory failure ([place]) 12/15/2016  . Morbid obesity ([place]) 12/15/2016  . Hard of hearing 12/15/2016  . Chronic kidney disease, stage III (moderate) 12/06/2016  . Bell's palsy   . Facial droop   . Paroxysmal atrial fibrillation (HCC)   . Embolic stroke ([place])   . Dysphagia 03/14/2015  . Dysarthria 03/14/2015  .  Stroke ([place]) 03/14/2015  . Cellulitis 01/11/2014  . Bilateral lower extremity edema 01/11/2014  . Venous stasis dermatitis 01/11/2014  . DM type 2 with diabetic peripheral neuropathy ([place]) 01/11/2014  . Cellulitis and abscess of leg 01/11/2014  . Cellulitis and abscess 01/11/2014  . Depression 06/15/2013  . GERD (gastroesophageal reflux disease) 06/15/2013  . Constipation 06/15/2013  . Diabetic neuropathy ([place]) 06/15/2013  . Type 1 diabetes mellitus with peripheral circulatory complications ([place]) 95/18/8416  . Essential hypertension, benign 04/07/2013  . AKI (acute kidney injury) ([place]) 04/04/2013  . Cellulitis of lower extremity 04/04/2013  . Right hip pain 04/04/2013  . Renal insufficiency 12/16/2011  . History of CVA (cerebrovascular accident) 12/16/2011  . Dyslipidemia 12/16/2011  . Obesity (BMI 30-39.9) 12/16/2011  . Umbilical hernia with obstruction-partial on CT scan; easily reducible 12/14/2011  . History of PSVT (paroxysmal supraventricular tachycardia) 12/14/2011  . DM (diabetes mellitus) ([place]) 12/14/2011    Past Surgical History:  Procedure Laterality Date  . ADENOIDECTOMY    . HERNIA REPAIR    . MASTOIDECTOMY    . TONSILLECTOMY    . VASECTOMY    . VENTRAL HERNIA REPAIR  12/18/2011   Procedure: HERNIA REPAIR VENTRAL ADULT;  Surgeon: Adin Hector, MD;  Location: [facility];  Service: General;  Laterality: N/A;       Home  Medications    Prior to Admission medications   Medication Sig Start Date End Date Taking? Authorizing Provider  amitriptyline (ELAVIL) 25 MG tablet Take 25 mg by mouth at bedtime.   Yes Historical Provider, MD  aspirin EC 81 MG tablet Take 81 mg by mouth daily.   Yes Historical Provider, MD  B Complex Vitamins (VITAMIN B COMPLEX PO) Take 1 tablet by mouth daily.   Yes Historical Provider, MD  clopidogrel (PLAVIX) 75 MG tablet Take 75 mg by mouth daily.    Yes Historical Provider, MD  clotrimazole-betamethasone (LOTRISONE) lotion Apply 1  application topically 2 (two) times daily as needed (antifungal).    Yes Historical Provider, MD  fluticasone (FLONASE) 50 MCG/ACT nasal spray Place 1 spray into both nostrils daily. 03/17/15  Yes Kelvin Cellar, MD  furosemide (LASIX) 40 MG tablet Take 40-80 mg by mouth See admin instructions. Take 2 tablets every morning and take 1 tablet every evening   Yes Historical Provider, MD  gabapentin (NEURONTIN) 300 MG capsule Take 300 mg by mouth 3 (three) times daily.   Yes Historical Provider, MD  hydrocerin (EUCERIN) CREA Apply 1 application topically daily. 01/09/17  Yes Belkys A Regalado, MD  insulin aspart (NOVOLOG) 100 UNIT/ML injection Inject 5-13 Units into the skin See admin instructions. Blood sugar < 150 = 0 units 151-200 = 5 units 201-250 = 7 units 251-300 = 9 units 301-350 = 11 units 351-400 = 13 units >400 Contact MD   Yes Historical Provider, MD  insulin glargine (LANTUS) 100 UNIT/ML injection Inject 30 Units into the skin at bedtime.    Yes Historical Provider, MD  liver oil-zinc oxide (DESITIN) 40 % ointment Apply 1 application topically 3 (three) times daily.   Yes Historical Provider, MD  metoprolol tartrate (LOPRESSOR) 25 MG tablet Take 1 tablet (25 mg total) by mouth 2 (two) times daily. 03/17/15  Yes Kelvin Cellar, MD  pantoprazole (PROTONIX) 40 MG tablet Take 40 mg by mouth daily.    Yes Historical Provider, MD  potassium chloride (KLOR-CON) 20 MEQ packet Take 20 mEq by mouth 2 (two) times daily.   Yes Historical Provider, MD  simvastatin (ZOCOR) 20 MG tablet Take 20 mg by mouth daily.   Yes Historical Provider, MD  terazosin (HYTRIN) 2 MG capsule Take 4 mg by mouth daily. Take two 2-mg tablets to = 4 mg   Yes Historical Provider, MD  vitamin B-12 (CYANOCOBALAMIN) 1000 MCG tablet Take 1,000 mcg by mouth daily.   Yes Historical Provider, MD  gabapentin (NEURONTIN) 100 MG capsule Take 1 capsule (100 mg total) by mouth 3 (three) times daily. Patient not taking: Reported on  02/13/2017 01/09/17   Elmarie Shiley, MD    Family History Family History  Problem Relation Age of Onset  . Pneumonia Mother   . Heart attack Father     Social History Social History  Substance Use Topics  . Smoking status: Never Smoker  . Smokeless tobacco: Never Used  . Alcohol use No     Allergies   Ativan [lorazepam]; Flomax [tamsulosin hcl]; and Glucophage [metformin hcl]   Review of Systems Review of Systems  Constitutional: Negative for fever.  HENT: Negative for sore throat.   Eyes: Negative for visual disturbance.  Respiratory: Negative for shortness of breath.   Cardiovascular: Negative for chest pain.  Gastrointestinal: Negative for abdominal pain, nausea and vomiting.  Genitourinary: Negative for difficulty urinating.  Musculoskeletal: Negative for back pain and neck stiffness.  Skin: Positive for wound.  Negative for rash.  Neurological: Negative for syncope and headaches.     Physical Exam Updated Vital Signs BP 130/89   Pulse 80   Temp 97.8 F (36.6 C) (Oral)   Resp 20   Ht 5\' 11"  (1.803 m)   Wt 246 lb (111.6 kg)   SpO2 94%   BMI 34.31 kg/m   Physical Exam  Constitutional: He is oriented to person, place, and time. He appears well-developed and well-nourished. No distress.  HENT:  Head: Normocephalic.  Abrasion   Eyes: Conjunctivae and EOM are normal.  Neck: Normal range of motion.  Cardiovascular: Normal rate, regular rhythm, normal heart sounds and intact distal pulses.  Exam reveals no gallop and no friction rub.   No murmur heard. Pulmonary/Chest: Effort normal and breath sounds normal. No respiratory distress. He has no wheezes. He has no rales.  Abdominal: Soft. He exhibits no distension. There is no tenderness. There is no guarding.  Musculoskeletal: He exhibits edema (1+to shin).  Neurological: He is alert and oriented to person, place, and time. He has normal strength. No cranial nerve deficit or sensory deficit. Coordination  (intention tremor vs limb ataxia) abnormal.  Skin: Skin is warm and dry. He is not diaphoretic. There is erythema (bilateral lower extremities).  Nursing note and vitals reviewed.    ED Treatments / Results  Labs (all labs ordered are listed, but only abnormal results are displayed) Labs Reviewed  CBC WITH DIFFERENTIAL/PLATELET - Abnormal; Notable for the following:       Result Value   Hemoglobin 11.4 (*)    HCT 37.9 (*)    MCV 68.8 (*)    MCH 20.7 (*)    All other components within normal limits  BASIC METABOLIC PANEL - Abnormal; Notable for the following:    Chloride 92 (*)    CO2 35 (*)    Glucose, Bld 296 (*)    Creatinine, Ser 1.43 (*)    GFR calc non Af Amer 43 (*)    GFR calc Af Amer 50 (*)    All other components within normal limits  URINALYSIS, ROUTINE W REFLEX MICROSCOPIC - Abnormal; Notable for the following:    Glucose, UA >=500 (*)    Squamous Epithelial / LPF 0-5 (*)    All other components within normal limits  CBG MONITORING, ED - Abnormal; Notable for the following:    Glucose-Capillary 251 (*)    All other components within normal limits  CBG MONITORING, ED - Abnormal; Notable for the following:    Glucose-Capillary 203 (*)    All other components within normal limits    EKG  EKG Interpretation None       Radiology Ct Head Wo Contrast  Result Date: 02/13/2017 CLINICAL DATA:  Unwitnessed fall from wheelchair, the patient is on blood thinner EXAM: CT HEAD WITHOUT CONTRAST TECHNIQUE: Contiguous axial images were obtained from the base of the skull through the vertex without intravenous contrast. COMPARISON:  CT brain scan of 01/03/2017 FINDINGS: Brain: The ventricular system is prominent as are the cortical sulci diffusely consistent with diffuse atrophy. Moderate to severe small vessel ischemic changes noted in the periventricular white matter and small lacunar infarcts remain. However, there does appear to be a low-attenuation area within the left  cerebellum suspicious for acute or subacute left cerebellar infarct. No hemorrhage is seen. No mass effect is noted. Vascular: No vascular abnormality is noted on this unenhanced study. Skull: On bone window images, no calvarial abnormality is seen. Sinuses/Orbits: Minimal  mucosal thickening is present within the left maxillary sinus. No sinusitis is seen. Other: None. IMPRESSION: 1. Vague low-attenuation in the left cerebellum suspicious for acute or subacute cerebellar infarct. No hemorrhage. 2. Little change in diffuse atrophy and moderately severe small vessel ischemic change with old small lacunar infarcts. Electronically Signed   By: Ivar Drape M.D.   On: 02/13/2017 08:24   Mr Brain Wo Contrast  Result Date: 02/13/2017 CLINICAL DATA:  Fall.  Abnormal CT.  Rule out stroke. EXAM: MRI HEAD WITHOUT CONTRAST TECHNIQUE: Multiplanar, multiecho pulse sequences of the brain and surrounding structures were obtained without intravenous contrast. COMPARISON:  CT head 02/13/2017 FINDINGS: Brain: Negative for acute infarction. Chronic microvascular ischemic changes in the white matter. Chronic infarct in the right cerebellum accounts for the CT hypodensity. Mild chronic ischemia in the pons. Mild cortical atrophy. Negative for hydrocephalus. Negative for hemorrhage or mass. Image quality degraded by mild motion. Vascular: Normal arterial flow void. Skull and upper cervical spine: Negative Sinuses/Orbits: Mild mucosal edema paranasal sinuses.  Normal orbit. Other: None IMPRESSION: No acute abnormality Atrophy and chronic microvascular ischemia. Chronic infarct right cerebellum. Electronically Signed   By: Franchot Gallo M.D.   On: 02/13/2017 10:09    Procedures Procedures (including critical care time)  Medications Ordered in ED Medications - No data to display   Initial Impression / Assessment and Plan / ED Course  I have reviewed the triage vital signs and the nursing notes.  Pertinent labs & imaging  results that were available during my care of the patient were reviewed by me and considered in my medical decision making (see chart for details).    83yo male with history of fhtn, hlpd, CHF, prostate cancer, CVA, DM, bilateral cellulitis, pSVT, presents with concern for fall from wheelchair and hyperglycemia at facility. Received insulin prior to arrival.  No sign of DKA. Labs at baseline. CT head done given head trauma shows ?acute/subacute CVA.  MR ordered and shows chronic CVA, no acute findings.  Pt stable for discharge to facility. Patient discharged in stable condition with understanding of reasons to return.    Final Clinical Impressions(s) / ED Diagnoses   Final diagnoses:  Fall, initial encounter  Abrasion  Hyperglycemia    New Prescriptions Discharge Medication List as of 02/13/2017 11:03 AM       Gareth Morgan, MD 02/13/17 2240

## 2017-02-13 NOTE — ED Notes (Signed)
Returned from MRI 

## 2017-02-13 NOTE — Progress Notes (Signed)
Inpatient Diabetes Program Recommendations  AACE/ADA: New Consensus Statement on Inpatient Glycemic Control (2015)  Target Ranges:  Prepandial:   less than 140 mg/dL      Peak postprandial:   less than 180 mg/dL (1-2 hours)      Critically ill patients:  140 - 180 mg/dL   Lab Results  Component Value Date   GLUCAP 251 (H) 02/13/2017   HGBA1C 8.6 (H) 01/02/2017    Review of Glycemic Control  Diabetes history: DM 2 Outpatient Diabetes medications: Lantus 30 units qhs, Novolog 5-13 units tid Current orders for Inpatient glycemic control: None in ED  Inpatient Diabetes Program Recommendations:   Glucose elevated at 250. Please consider Starting CBG's and Novolog Sensitive Correction (0-9 units tid) while in the ED.   Thanks,  Tama Headings RN, MSN, [facility] Inpatient Diabetes Coordinator Team Pager [phone] (8a-5p)

## 2017-02-20 DIAGNOSIS — I5042 Chronic combined systolic (congestive) and diastolic (congestive) heart failure: Secondary | ICD-10-CM | POA: Diagnosis not present

## 2017-02-20 DIAGNOSIS — E1122 Type 2 diabetes mellitus with diabetic chronic kidney disease: Secondary | ICD-10-CM | POA: Diagnosis not present

## 2017-02-20 DIAGNOSIS — I130 Hypertensive heart and chronic kidney disease with heart failure and stage 1 through stage 4 chronic kidney disease, or unspecified chronic kidney disease: Secondary | ICD-10-CM | POA: Diagnosis not present

## 2017-02-20 DIAGNOSIS — E1142 Type 2 diabetes mellitus with diabetic polyneuropathy: Secondary | ICD-10-CM | POA: Diagnosis not present

## 2017-02-20 DIAGNOSIS — N183 Chronic kidney disease, stage 3 (moderate): Secondary | ICD-10-CM | POA: Diagnosis not present

## 2017-02-20 DIAGNOSIS — I69322 Dysarthria following cerebral infarction: Secondary | ICD-10-CM | POA: Diagnosis not present

## 2017-02-22 DIAGNOSIS — I130 Hypertensive heart and chronic kidney disease with heart failure and stage 1 through stage 4 chronic kidney disease, or unspecified chronic kidney disease: Secondary | ICD-10-CM | POA: Diagnosis not present

## 2017-02-22 DIAGNOSIS — I69322 Dysarthria following cerebral infarction: Secondary | ICD-10-CM | POA: Diagnosis not present

## 2017-02-22 DIAGNOSIS — E1142 Type 2 diabetes mellitus with diabetic polyneuropathy: Secondary | ICD-10-CM | POA: Diagnosis not present

## 2017-02-22 DIAGNOSIS — I5042 Chronic combined systolic (congestive) and diastolic (congestive) heart failure: Secondary | ICD-10-CM | POA: Diagnosis not present

## 2017-02-22 DIAGNOSIS — N183 Chronic kidney disease, stage 3 (moderate): Secondary | ICD-10-CM | POA: Diagnosis not present

## 2017-02-22 DIAGNOSIS — E1122 Type 2 diabetes mellitus with diabetic chronic kidney disease: Secondary | ICD-10-CM | POA: Diagnosis not present

## 2017-02-25 DIAGNOSIS — E1122 Type 2 diabetes mellitus with diabetic chronic kidney disease: Secondary | ICD-10-CM | POA: Diagnosis not present

## 2017-02-25 DIAGNOSIS — I5042 Chronic combined systolic (congestive) and diastolic (congestive) heart failure: Secondary | ICD-10-CM | POA: Diagnosis not present

## 2017-02-25 DIAGNOSIS — I130 Hypertensive heart and chronic kidney disease with heart failure and stage 1 through stage 4 chronic kidney disease, or unspecified chronic kidney disease: Secondary | ICD-10-CM | POA: Diagnosis not present

## 2017-02-25 DIAGNOSIS — E1142 Type 2 diabetes mellitus with diabetic polyneuropathy: Secondary | ICD-10-CM | POA: Diagnosis not present

## 2017-02-25 DIAGNOSIS — N183 Chronic kidney disease, stage 3 (moderate): Secondary | ICD-10-CM | POA: Diagnosis not present

## 2017-02-25 DIAGNOSIS — I69322 Dysarthria following cerebral infarction: Secondary | ICD-10-CM | POA: Diagnosis not present

## 2017-02-26 DIAGNOSIS — E1122 Type 2 diabetes mellitus with diabetic chronic kidney disease: Secondary | ICD-10-CM | POA: Diagnosis not present

## 2017-02-26 DIAGNOSIS — I69322 Dysarthria following cerebral infarction: Secondary | ICD-10-CM | POA: Diagnosis not present

## 2017-02-26 DIAGNOSIS — I130 Hypertensive heart and chronic kidney disease with heart failure and stage 1 through stage 4 chronic kidney disease, or unspecified chronic kidney disease: Secondary | ICD-10-CM | POA: Diagnosis not present

## 2017-02-26 DIAGNOSIS — E1142 Type 2 diabetes mellitus with diabetic polyneuropathy: Secondary | ICD-10-CM | POA: Diagnosis not present

## 2017-02-26 DIAGNOSIS — I5042 Chronic combined systolic (congestive) and diastolic (congestive) heart failure: Secondary | ICD-10-CM | POA: Diagnosis not present

## 2017-02-26 DIAGNOSIS — N183 Chronic kidney disease, stage 3 (moderate): Secondary | ICD-10-CM | POA: Diagnosis not present

## 2017-02-27 DIAGNOSIS — I69322 Dysarthria following cerebral infarction: Secondary | ICD-10-CM | POA: Diagnosis not present

## 2017-02-27 DIAGNOSIS — N183 Chronic kidney disease, stage 3 (moderate): Secondary | ICD-10-CM | POA: Diagnosis not present

## 2017-02-27 DIAGNOSIS — I130 Hypertensive heart and chronic kidney disease with heart failure and stage 1 through stage 4 chronic kidney disease, or unspecified chronic kidney disease: Secondary | ICD-10-CM | POA: Diagnosis not present

## 2017-02-27 DIAGNOSIS — I5042 Chronic combined systolic (congestive) and diastolic (congestive) heart failure: Secondary | ICD-10-CM | POA: Diagnosis not present

## 2017-02-27 DIAGNOSIS — E1142 Type 2 diabetes mellitus with diabetic polyneuropathy: Secondary | ICD-10-CM | POA: Diagnosis not present

## 2017-02-27 DIAGNOSIS — E1122 Type 2 diabetes mellitus with diabetic chronic kidney disease: Secondary | ICD-10-CM | POA: Diagnosis not present

## 2017-03-01 DIAGNOSIS — E538 Deficiency of other specified B group vitamins: Secondary | ICD-10-CM | POA: Diagnosis not present

## 2017-03-01 DIAGNOSIS — E1165 Type 2 diabetes mellitus with hyperglycemia: Secondary | ICD-10-CM | POA: Diagnosis not present

## 2017-03-01 DIAGNOSIS — I5032 Chronic diastolic (congestive) heart failure: Secondary | ICD-10-CM | POA: Diagnosis not present

## 2017-03-01 DIAGNOSIS — E1149 Type 2 diabetes mellitus with other diabetic neurological complication: Secondary | ICD-10-CM | POA: Diagnosis not present

## 2017-03-01 DIAGNOSIS — I8311 Varicose veins of right lower extremity with inflammation: Secondary | ICD-10-CM | POA: Diagnosis not present

## 2017-03-01 DIAGNOSIS — Z794 Long term (current) use of insulin: Secondary | ICD-10-CM | POA: Diagnosis not present

## 2017-03-01 DIAGNOSIS — Z1389 Encounter for screening for other disorder: Secondary | ICD-10-CM | POA: Diagnosis not present

## 2017-03-01 DIAGNOSIS — I6789 Other cerebrovascular disease: Secondary | ICD-10-CM | POA: Diagnosis not present

## 2017-03-02 DIAGNOSIS — E1122 Type 2 diabetes mellitus with diabetic chronic kidney disease: Secondary | ICD-10-CM | POA: Diagnosis not present

## 2017-03-02 DIAGNOSIS — I69322 Dysarthria following cerebral infarction: Secondary | ICD-10-CM | POA: Diagnosis not present

## 2017-03-02 DIAGNOSIS — E1142 Type 2 diabetes mellitus with diabetic polyneuropathy: Secondary | ICD-10-CM | POA: Diagnosis not present

## 2017-03-02 DIAGNOSIS — I5042 Chronic combined systolic (congestive) and diastolic (congestive) heart failure: Secondary | ICD-10-CM | POA: Diagnosis not present

## 2017-03-02 DIAGNOSIS — N183 Chronic kidney disease, stage 3 (moderate): Secondary | ICD-10-CM | POA: Diagnosis not present

## 2017-03-02 DIAGNOSIS — I130 Hypertensive heart and chronic kidney disease with heart failure and stage 1 through stage 4 chronic kidney disease, or unspecified chronic kidney disease: Secondary | ICD-10-CM | POA: Diagnosis not present

## 2017-03-05 DIAGNOSIS — I5042 Chronic combined systolic (congestive) and diastolic (congestive) heart failure: Secondary | ICD-10-CM | POA: Diagnosis not present

## 2017-03-05 DIAGNOSIS — I130 Hypertensive heart and chronic kidney disease with heart failure and stage 1 through stage 4 chronic kidney disease, or unspecified chronic kidney disease: Secondary | ICD-10-CM | POA: Diagnosis not present

## 2017-03-05 DIAGNOSIS — N183 Chronic kidney disease, stage 3 (moderate): Secondary | ICD-10-CM | POA: Diagnosis not present

## 2017-03-05 DIAGNOSIS — E1142 Type 2 diabetes mellitus with diabetic polyneuropathy: Secondary | ICD-10-CM | POA: Diagnosis not present

## 2017-03-05 DIAGNOSIS — E1122 Type 2 diabetes mellitus with diabetic chronic kidney disease: Secondary | ICD-10-CM | POA: Diagnosis not present

## 2017-03-05 DIAGNOSIS — I69322 Dysarthria following cerebral infarction: Secondary | ICD-10-CM | POA: Diagnosis not present

## 2017-03-06 DIAGNOSIS — E1122 Type 2 diabetes mellitus with diabetic chronic kidney disease: Secondary | ICD-10-CM | POA: Diagnosis not present

## 2017-03-06 DIAGNOSIS — N183 Chronic kidney disease, stage 3 (moderate): Secondary | ICD-10-CM | POA: Diagnosis not present

## 2017-03-06 DIAGNOSIS — I69322 Dysarthria following cerebral infarction: Secondary | ICD-10-CM | POA: Diagnosis not present

## 2017-03-06 DIAGNOSIS — I130 Hypertensive heart and chronic kidney disease with heart failure and stage 1 through stage 4 chronic kidney disease, or unspecified chronic kidney disease: Secondary | ICD-10-CM | POA: Diagnosis not present

## 2017-03-06 DIAGNOSIS — I5042 Chronic combined systolic (congestive) and diastolic (congestive) heart failure: Secondary | ICD-10-CM | POA: Diagnosis not present

## 2017-03-06 DIAGNOSIS — E1142 Type 2 diabetes mellitus with diabetic polyneuropathy: Secondary | ICD-10-CM | POA: Diagnosis not present

## 2017-03-07 DIAGNOSIS — N183 Chronic kidney disease, stage 3 (moderate): Secondary | ICD-10-CM | POA: Diagnosis not present

## 2017-03-07 DIAGNOSIS — E1122 Type 2 diabetes mellitus with diabetic chronic kidney disease: Secondary | ICD-10-CM | POA: Diagnosis not present

## 2017-03-07 DIAGNOSIS — I69322 Dysarthria following cerebral infarction: Secondary | ICD-10-CM | POA: Diagnosis not present

## 2017-03-07 DIAGNOSIS — E1142 Type 2 diabetes mellitus with diabetic polyneuropathy: Secondary | ICD-10-CM | POA: Diagnosis not present

## 2017-03-07 DIAGNOSIS — I5042 Chronic combined systolic (congestive) and diastolic (congestive) heart failure: Secondary | ICD-10-CM | POA: Diagnosis not present

## 2017-03-07 DIAGNOSIS — I130 Hypertensive heart and chronic kidney disease with heart failure and stage 1 through stage 4 chronic kidney disease, or unspecified chronic kidney disease: Secondary | ICD-10-CM | POA: Diagnosis not present

## 2017-03-09 DIAGNOSIS — E1142 Type 2 diabetes mellitus with diabetic polyneuropathy: Secondary | ICD-10-CM | POA: Diagnosis not present

## 2017-03-09 DIAGNOSIS — I69322 Dysarthria following cerebral infarction: Secondary | ICD-10-CM | POA: Diagnosis not present

## 2017-03-09 DIAGNOSIS — N183 Chronic kidney disease, stage 3 (moderate): Secondary | ICD-10-CM | POA: Diagnosis not present

## 2017-03-09 DIAGNOSIS — E1122 Type 2 diabetes mellitus with diabetic chronic kidney disease: Secondary | ICD-10-CM | POA: Diagnosis not present

## 2017-03-09 DIAGNOSIS — I130 Hypertensive heart and chronic kidney disease with heart failure and stage 1 through stage 4 chronic kidney disease, or unspecified chronic kidney disease: Secondary | ICD-10-CM | POA: Diagnosis not present

## 2017-03-09 DIAGNOSIS — I5042 Chronic combined systolic (congestive) and diastolic (congestive) heart failure: Secondary | ICD-10-CM | POA: Diagnosis not present

## 2017-03-12 ENCOUNTER — Encounter: Payer: Self-pay | Admitting: *Deleted

## 2017-03-12 ENCOUNTER — Other Ambulatory Visit: Payer: Self-pay | Admitting: *Deleted

## 2017-03-12 DIAGNOSIS — E1142 Type 2 diabetes mellitus with diabetic polyneuropathy: Secondary | ICD-10-CM | POA: Diagnosis not present

## 2017-03-12 DIAGNOSIS — I130 Hypertensive heart and chronic kidney disease with heart failure and stage 1 through stage 4 chronic kidney disease, or unspecified chronic kidney disease: Secondary | ICD-10-CM | POA: Diagnosis not present

## 2017-03-12 DIAGNOSIS — N183 Chronic kidney disease, stage 3 (moderate): Secondary | ICD-10-CM | POA: Diagnosis not present

## 2017-03-12 DIAGNOSIS — I5042 Chronic combined systolic (congestive) and diastolic (congestive) heart failure: Secondary | ICD-10-CM | POA: Diagnosis not present

## 2017-03-12 DIAGNOSIS — I69322 Dysarthria following cerebral infarction: Secondary | ICD-10-CM | POA: Diagnosis not present

## 2017-03-12 DIAGNOSIS — E1122 Type 2 diabetes mellitus with diabetic chronic kidney disease: Secondary | ICD-10-CM | POA: Diagnosis not present

## 2017-03-12 NOTE — Patient Outreach (Signed)
[facility]) Care Management  03/12/2017  CRESTON KLAS Oct 02, 1931 [id number]  Received voice mail message from [name] (daughter-in-law/ caregiver on [place] written consent), re: Quintrell Baze, 83 y/o male previously active with [facility]; patient is currently residing at Assisted/ SNF.  In her voice mail, [name] requested call-back to obtain information about charges incurred at [facility], where patient discharged from hospital to, but is no longer residing.  [name] voiced specific questions about charges and Medicare Coverage for SNF rehabilitation visits.  Contacted Chrystal Land, [facility] CSW by phone, and confirmed that [name] should contact business office of [employer] for detailed explanation of charges; as an alternative, [name] could also contact Medicare directly to understand patient's specific coverage.  HIPAA compliant voice mail message left for [name], sharing this information, and advising her to contact [place] directly for explanation of charges.  [name], RN, BSN, [employer] [facility] Care Management  [phone]

## 2017-03-14 DIAGNOSIS — E1142 Type 2 diabetes mellitus with diabetic polyneuropathy: Secondary | ICD-10-CM | POA: Diagnosis not present

## 2017-03-14 DIAGNOSIS — I130 Hypertensive heart and chronic kidney disease with heart failure and stage 1 through stage 4 chronic kidney disease, or unspecified chronic kidney disease: Secondary | ICD-10-CM | POA: Diagnosis not present

## 2017-03-14 DIAGNOSIS — E1122 Type 2 diabetes mellitus with diabetic chronic kidney disease: Secondary | ICD-10-CM | POA: Diagnosis not present

## 2017-03-14 DIAGNOSIS — I69322 Dysarthria following cerebral infarction: Secondary | ICD-10-CM | POA: Diagnosis not present

## 2017-03-14 DIAGNOSIS — N183 Chronic kidney disease, stage 3 (moderate): Secondary | ICD-10-CM | POA: Diagnosis not present

## 2017-03-14 DIAGNOSIS — I5042 Chronic combined systolic (congestive) and diastolic (congestive) heart failure: Secondary | ICD-10-CM | POA: Diagnosis not present

## 2017-03-15 DIAGNOSIS — N183 Chronic kidney disease, stage 3 (moderate): Secondary | ICD-10-CM | POA: Diagnosis not present

## 2017-03-15 DIAGNOSIS — E1122 Type 2 diabetes mellitus with diabetic chronic kidney disease: Secondary | ICD-10-CM | POA: Diagnosis not present

## 2017-03-15 DIAGNOSIS — I130 Hypertensive heart and chronic kidney disease with heart failure and stage 1 through stage 4 chronic kidney disease, or unspecified chronic kidney disease: Secondary | ICD-10-CM | POA: Diagnosis not present

## 2017-03-15 DIAGNOSIS — E1142 Type 2 diabetes mellitus with diabetic polyneuropathy: Secondary | ICD-10-CM | POA: Diagnosis not present

## 2017-03-15 DIAGNOSIS — I69322 Dysarthria following cerebral infarction: Secondary | ICD-10-CM | POA: Diagnosis not present

## 2017-03-15 DIAGNOSIS — I5042 Chronic combined systolic (congestive) and diastolic (congestive) heart failure: Secondary | ICD-10-CM | POA: Diagnosis not present

## 2017-03-19 DIAGNOSIS — N183 Chronic kidney disease, stage 3 (moderate): Secondary | ICD-10-CM | POA: Diagnosis not present

## 2017-03-19 DIAGNOSIS — I5042 Chronic combined systolic (congestive) and diastolic (congestive) heart failure: Secondary | ICD-10-CM | POA: Diagnosis not present

## 2017-03-19 DIAGNOSIS — E1142 Type 2 diabetes mellitus with diabetic polyneuropathy: Secondary | ICD-10-CM | POA: Diagnosis not present

## 2017-03-19 DIAGNOSIS — I69322 Dysarthria following cerebral infarction: Secondary | ICD-10-CM | POA: Diagnosis not present

## 2017-03-19 DIAGNOSIS — I130 Hypertensive heart and chronic kidney disease with heart failure and stage 1 through stage 4 chronic kidney disease, or unspecified chronic kidney disease: Secondary | ICD-10-CM | POA: Diagnosis not present

## 2017-03-19 DIAGNOSIS — E1122 Type 2 diabetes mellitus with diabetic chronic kidney disease: Secondary | ICD-10-CM | POA: Diagnosis not present

## 2017-03-20 DIAGNOSIS — I998 Other disorder of circulatory system: Secondary | ICD-10-CM | POA: Diagnosis not present

## 2017-03-20 DIAGNOSIS — I5032 Chronic diastolic (congestive) heart failure: Secondary | ICD-10-CM | POA: Diagnosis not present

## 2017-03-20 DIAGNOSIS — I10 Essential (primary) hypertension: Secondary | ICD-10-CM | POA: Diagnosis not present

## 2017-03-20 DIAGNOSIS — L03119 Cellulitis of unspecified part of limb: Secondary | ICD-10-CM | POA: Diagnosis not present

## 2017-03-20 DIAGNOSIS — I872 Venous insufficiency (chronic) (peripheral): Secondary | ICD-10-CM | POA: Diagnosis not present

## 2017-03-20 DIAGNOSIS — E0840 Diabetes mellitus due to underlying condition with diabetic neuropathy, unspecified: Secondary | ICD-10-CM | POA: Diagnosis not present

## 2017-03-20 DIAGNOSIS — Z794 Long term (current) use of insulin: Secondary | ICD-10-CM | POA: Diagnosis not present

## 2017-03-22 DIAGNOSIS — I130 Hypertensive heart and chronic kidney disease with heart failure and stage 1 through stage 4 chronic kidney disease, or unspecified chronic kidney disease: Secondary | ICD-10-CM | POA: Diagnosis not present

## 2017-03-22 DIAGNOSIS — E1142 Type 2 diabetes mellitus with diabetic polyneuropathy: Secondary | ICD-10-CM | POA: Diagnosis not present

## 2017-03-22 DIAGNOSIS — I5042 Chronic combined systolic (congestive) and diastolic (congestive) heart failure: Secondary | ICD-10-CM | POA: Diagnosis not present

## 2017-03-22 DIAGNOSIS — I69322 Dysarthria following cerebral infarction: Secondary | ICD-10-CM | POA: Diagnosis not present

## 2017-03-22 DIAGNOSIS — N183 Chronic kidney disease, stage 3 (moderate): Secondary | ICD-10-CM | POA: Diagnosis not present

## 2017-03-22 DIAGNOSIS — E1122 Type 2 diabetes mellitus with diabetic chronic kidney disease: Secondary | ICD-10-CM | POA: Diagnosis not present

## 2017-03-23 ENCOUNTER — Ambulatory Visit (HOSPITAL_COMMUNITY)
Admission: RE | Admit: 2017-03-23 | Discharge: 2017-03-23 | Disposition: A | Discharge status: Home / Self Care | Payer: Medicare Other | Source: Ambulatory Visit | Attending: Internal Medicine | Admitting: Internal Medicine

## 2017-03-23 ENCOUNTER — Other Ambulatory Visit (HOSPITAL_COMMUNITY): Payer: Self-pay | Admitting: Internal Medicine

## 2017-03-23 ENCOUNTER — Ambulatory Visit (HOSPITAL_BASED_OUTPATIENT_CLINIC_OR_DEPARTMENT_OTHER)
Admission: RE | Admit: 2017-03-23 | Discharge: 2017-03-23 | Disposition: A | Discharge status: Home / Self Care | Payer: Medicare Other | Source: Ambulatory Visit | Attending: Internal Medicine | Admitting: Internal Medicine

## 2017-03-23 DIAGNOSIS — R52 Pain, unspecified: Secondary | ICD-10-CM

## 2017-03-23 DIAGNOSIS — I998 Other disorder of circulatory system: Secondary | ICD-10-CM

## 2017-03-23 DIAGNOSIS — I872 Venous insufficiency (chronic) (peripheral): Secondary | ICD-10-CM | POA: Diagnosis not present

## 2017-03-23 NOTE — Progress Notes (Signed)
**  Preliminary report by tech**  Bilateral lower extremity venous duplex completed. There is no obvious evidence of deep or superficial vein thrombosis involving the right and left lower extremities. All clearly visualized vessels appear patent and compressible. There is no evidence of Baker's cysts bilaterally. The right posterior tibial, peroneal, and anterior tibial arteries all demonstrate biphasic flow. The left posterior tibial, and anterior tibial arteries also demonstrate biphasic flow. Results were faxed to Dr. Fara Olden.  03/23/17 12:17 PM Jared Hall RVT

## 2017-03-26 DIAGNOSIS — I130 Hypertensive heart and chronic kidney disease with heart failure and stage 1 through stage 4 chronic kidney disease, or unspecified chronic kidney disease: Secondary | ICD-10-CM | POA: Diagnosis not present

## 2017-03-26 DIAGNOSIS — E1122 Type 2 diabetes mellitus with diabetic chronic kidney disease: Secondary | ICD-10-CM | POA: Diagnosis not present

## 2017-03-26 DIAGNOSIS — E1142 Type 2 diabetes mellitus with diabetic polyneuropathy: Secondary | ICD-10-CM | POA: Diagnosis not present

## 2017-03-26 DIAGNOSIS — N183 Chronic kidney disease, stage 3 (moderate): Secondary | ICD-10-CM | POA: Diagnosis not present

## 2017-03-26 DIAGNOSIS — I5042 Chronic combined systolic (congestive) and diastolic (congestive) heart failure: Secondary | ICD-10-CM | POA: Diagnosis not present

## 2017-03-26 DIAGNOSIS — I69322 Dysarthria following cerebral infarction: Secondary | ICD-10-CM | POA: Diagnosis not present

## 2017-03-28 ENCOUNTER — Encounter (HOSPITAL_COMMUNITY): Payer: Self-pay | Admitting: Emergency Medicine

## 2017-03-28 ENCOUNTER — Emergency Department (HOSPITAL_COMMUNITY)
Admission: EM | Admit: 2017-03-28 | Discharge: 2017-03-28 | Disposition: A | Discharge status: Home / Self Care | Payer: Medicare Other | Attending: Emergency Medicine | Admitting: Emergency Medicine

## 2017-03-28 DIAGNOSIS — Z8673 Personal history of transient ischemic attack (TIA), and cerebral infarction without residual deficits: Secondary | ICD-10-CM | POA: Insufficient documentation

## 2017-03-28 DIAGNOSIS — N183 Chronic kidney disease, stage 3 (moderate): Secondary | ICD-10-CM | POA: Insufficient documentation

## 2017-03-28 DIAGNOSIS — Z8546 Personal history of malignant neoplasm of prostate: Secondary | ICD-10-CM | POA: Insufficient documentation

## 2017-03-28 DIAGNOSIS — Z7982 Long term (current) use of aspirin: Secondary | ICD-10-CM | POA: Insufficient documentation

## 2017-03-28 DIAGNOSIS — E1122 Type 2 diabetes mellitus with diabetic chronic kidney disease: Secondary | ICD-10-CM | POA: Insufficient documentation

## 2017-03-28 DIAGNOSIS — I509 Heart failure, unspecified: Secondary | ICD-10-CM | POA: Diagnosis not present

## 2017-03-28 DIAGNOSIS — Z794 Long term (current) use of insulin: Secondary | ICD-10-CM | POA: Insufficient documentation

## 2017-03-28 DIAGNOSIS — M79671 Pain in right foot: Secondary | ICD-10-CM | POA: Diagnosis present

## 2017-03-28 DIAGNOSIS — M7989 Other specified soft tissue disorders: Secondary | ICD-10-CM | POA: Diagnosis not present

## 2017-03-28 DIAGNOSIS — R030 Elevated blood-pressure reading, without diagnosis of hypertension: Secondary | ICD-10-CM | POA: Diagnosis not present

## 2017-03-28 DIAGNOSIS — I130 Hypertensive heart and chronic kidney disease with heart failure and stage 1 through stage 4 chronic kidney disease, or unspecified chronic kidney disease: Secondary | ICD-10-CM | POA: Diagnosis not present

## 2017-03-28 DIAGNOSIS — R2240 Localized swelling, mass and lump, unspecified lower limb: Secondary | ICD-10-CM | POA: Diagnosis not present

## 2017-03-28 DIAGNOSIS — M25559 Pain in unspecified hip: Secondary | ICD-10-CM | POA: Diagnosis not present

## 2017-03-28 DIAGNOSIS — R609 Edema, unspecified: Secondary | ICD-10-CM | POA: Diagnosis not present

## 2017-03-28 LAB — CBC WITH DIFFERENTIAL/PLATELET
BASOS ABS: 0 10*3/uL (ref 0.0–0.1)
Basophils Relative: 0 %
EOS ABS: 0.4 10*3/uL (ref 0.0–0.7)
Eosinophils Relative: 4 %
HCT: 35.4 % — ABNORMAL LOW (ref 39.0–52.0)
Hemoglobin: 10.8 g/dL — ABNORMAL LOW (ref 13.0–17.0)
LYMPHS ABS: 1.8 10*3/uL (ref 0.7–4.0)
Lymphocytes Relative: 21 %
MCH: 21.2 pg — ABNORMAL LOW (ref 26.0–34.0)
MCHC: 30.5 g/dL (ref 30.0–36.0)
MCV: 69.5 fL — ABNORMAL LOW (ref 78.0–100.0)
Monocytes Absolute: 1 10*3/uL (ref 0.1–1.0)
Monocytes Relative: 11 %
NEUTROS ABS: 5.6 10*3/uL (ref 1.7–7.7)
Neutrophils Relative %: 64 %
Platelets: 247 10*3/uL (ref 150–400)
RBC: 5.09 MIL/uL (ref 4.22–5.81)
RDW: 15.8 % — AB (ref 11.5–15.5)
WBC: 8.8 10*3/uL (ref 4.0–10.5)

## 2017-03-28 LAB — BASIC METABOLIC PANEL
Anion gap: 8 (ref 5–15)
BUN: 14 mg/dL (ref 6–20)
CALCIUM: 9.1 mg/dL (ref 8.9–10.3)
CO2: 30 mmol/L (ref 22–32)
CREATININE: 1.27 mg/dL — AB (ref 0.61–1.24)
Chloride: 101 mmol/L (ref 101–111)
GFR, EST AFRICAN AMERICAN: 58 mL/min — AB (ref >60)
GFR, EST NON AFRICAN AMERICAN: 50 mL/min — AB (ref >60)
Glucose, Bld: 183 mg/dL — ABNORMAL HIGH (ref 65–99)
Potassium: 3.5 mmol/L (ref 3.5–5.1)
SODIUM: 139 mmol/L (ref 135–145)

## 2017-03-28 MED ORDER — ACETAMINOPHEN 325 MG PO TABS
650.0000 mg | ORAL_TABLET | Freq: Once | ORAL | Status: AC
  Administered 2017-03-28: 650 mg via ORAL
  Filled 2017-03-28: qty 2

## 2017-03-28 MED ORDER — DOXYCYCLINE HYCLATE 100 MG PO CAPS: 100.0000 mg | ORAL_CAPSULE | Freq: Two times a day (BID) | ORAL | 0 refills | Status: DC

## 2017-03-28 NOTE — Discharge Instructions (Signed)
We have placed Unna boots on your feet, which will need to be replaced every week.  See your doctor for checkup next week, to see how things are going.  Start the antibiotic prescription, tomorrow morning.  Elevate your feet above the heart, as much as possible.

## 2017-03-28 NOTE — ED Notes (Signed)
Bed: WA10 Expected date:  Expected time:  Means of arrival:  Comments: EMS-cellulitis 

## 2017-03-28 NOTE — ED Triage Notes (Addendum)
Pt from [facility] (in the assisted living side not the memory care side) with complaints of bilateral lower leg pain that he states has been ongoing over 6 months. Per facility staff, pt has been non-ambulatory for 1 day. He states this is due to increase in pain. Per facility, pt has cellulitis. Per EMS, pt has not seen a Dr for this recently and has not had any medications for this. However, pt mentioned a "yellow liquid antibiotic" that he has been taking for 2 days

## 2017-03-28 NOTE — ED Provider Notes (Signed)
[facility] DEPT Provider Note   CSN: [id number] Arrival date & time: 03/28/17  1829     History   Chief Complaint Chief Complaint  Patient presents with  . Leg Pain    bilateral lower leg     HPI Jared Hall is a 83 y.o. male.  He reports bilateral foot pain, for 6 months, worsening requiring evaluation today.  He is primarily confined to wheelchair and bed.  He denies headache, fever, vomiting or dizziness.  He states he is taking his usual medications.  There are no other known modifying factors.  HPI  Past Medical History:  Diagnosis Date  . Alpha thalassemia ([place])   . Anemia 01/01/2017  . BPH (benign prostatic hyperplasia)   . CHF (congestive heart failure) ([place])   . High cholesterol   . Hypertension   . Lower extremity edema   . Onychomycosis   . Prostate cancer ([place])    S/P "8 weeks of radiation"  . Prostatitis    recurrent  . Sleep apnea   . Stroke [facility]) ~ 2005   denies residual on 2/123/2015  . Type II diabetes mellitus ([place])   . Umbilical hernia   . Urinary urgency    with incontinence    Patient Active Problem List   Diagnosis Date Noted  . Acute encephalopathy   . Acute ischemic stroke ([place])   . Generalized weakness   . Cerebrovascular disease   . Altered mental status   . Acute respiratory failure with hypercapnia ([place])   . CVA (cerebral vascular accident) ([place]) 01/01/2017  . Dehydration 01/01/2017  . Anemia 01/01/2017  . Acute respiratory failure ([place]) 12/15/2016  . Morbid obesity ([place]) 12/15/2016  . Hard of hearing 12/15/2016  . Chronic kidney disease, stage III (moderate) 12/06/2016  . Bell's palsy   . Facial droop   . Paroxysmal atrial fibrillation (HCC)   . Embolic stroke ([place])   . Dysphagia 03/14/2015  . Dysarthria 03/14/2015  . Stroke ([place]) 03/14/2015  . Cellulitis 01/11/2014  . Bilateral lower extremity edema 01/11/2014  . Venous stasis dermatitis 01/11/2014  . DM type 2 with diabetic peripheral neuropathy ([place]) 01/11/2014    . Cellulitis and abscess of leg 01/11/2014  . Cellulitis and abscess 01/11/2014  . Depression 06/15/2013  . GERD (gastroesophageal reflux disease) 06/15/2013  . Constipation 06/15/2013  . Diabetic neuropathy ([place]) 06/15/2013  . Type 1 diabetes mellitus with peripheral circulatory complications ([place]) 83/66/2947  . Essential hypertension, benign 04/07/2013  . AKI (acute kidney injury) ([place]) 04/04/2013  . Cellulitis of lower extremity 04/04/2013  . Right hip pain 04/04/2013  . Renal insufficiency 12/16/2011  . History of CVA (cerebrovascular accident) 12/16/2011  . Dyslipidemia 12/16/2011  . Obesity (BMI 30-39.9) 12/16/2011  . Umbilical hernia with obstruction-partial on CT scan; easily reducible 12/14/2011  . History of PSVT (paroxysmal supraventricular tachycardia) 12/14/2011  . DM (diabetes mellitus) ([place]) 12/14/2011    Past Surgical History:  Procedure Laterality Date  . ADENOIDECTOMY    . HERNIA REPAIR    . MASTOIDECTOMY    . TONSILLECTOMY    . VASECTOMY    . VENTRAL HERNIA REPAIR  12/18/2011   Procedure: HERNIA REPAIR VENTRAL ADULT;  Surgeon: Adin Hector, MD;  Location: [facility];  Service: General;  Laterality: N/A;       Home Medications    Prior to Admission medications   Medication Sig Start Date End Date Taking? Authorizing Provider  amitriptyline (ELAVIL) 25 MG tablet Take 25 mg by mouth at  bedtime.   Yes [provider]  aspirin EC 81 MG tablet Take 81 mg by mouth daily.   Yes [provider]  B Complex Vitamins (VITAMIN B COMPLEX PO) Take 1 tablet by mouth daily.   Yes [provider]  cephALEXin (KEFLEX) 500 MG capsule Take 500 mg by mouth 2 (two) times daily. For 10 days, starting5/8/18 @@ 2000.   Yes [provider]  clopidogrel (PLAVIX) 75 MG tablet Take 75 mg by mouth daily.    Yes [provider]  fluticasone (FLONASE) 50 MCG/ACT nasal spray Place 1 spray into both nostrils daily. 03/17/15  Yes Kelvin Cellar,  MD  furosemide (LASIX) 40 MG tablet Take 40 mg by mouth at bedtime.   Yes [provider]  furosemide (LASIX) 80 MG tablet Take 80 mg by mouth daily.   Yes [provider]  gabapentin (NEURONTIN) 300 MG capsule Take 300 mg by mouth 3 (three) times daily. Hold for sedation.   Yes [provider]  hydrocerin (EUCERIN) CREA Apply 1 application topically daily. Patient taking differently: Apply 1 application topically 2 (two) times daily as needed.  01/09/17  Yes Regalado, Belk[place] A, MD  insulin aspart (NOVOLOG) 100 UNIT/ML injection Inject 4-15 Units into the skin 2 (two) times daily. Blood sugar < 150 = 0 units 151-200 = 4 units 201-250 = 6 units 251-300 = 8 units 301-350 = 10 units 351-400 = 12 units >400 = 15 units   Yes [provider]  insulin glargine (LANTUS) 100 UNIT/ML injection Inject 50 Units into the skin 2 (two) times daily.    Yes [provider]  metoprolol tartrate (LOPRESSOR) 25 MG tablet Take 1 tablet (25 mg total) by mouth 2 (two) times daily. 03/17/15  Yes Kelvin Cellar, MD  pantoprazole (PROTONIX) 40 MG tablet Take 40 mg by mouth daily.    Yes [provider]  potassium chloride (KLOR-CON) 20 MEQ packet Take 20 mEq by mouth 2 (two) times daily.   Yes [provider]  senna-docusate (SENOKOT-S) 8.6-50 MG tablet Take 1 tablet by mouth 2 (two) times daily.   Yes [provider]  simvastatin (ZOCOR) 20 MG tablet Take 20 mg by mouth daily.   Yes [provider]  terazosin (HYTRIN) 2 MG capsule Take 4 mg by mouth daily. Take two 2-mg tablets to = 4 mg   Yes [provider]  vitamin B-12 (CYANOCOBALAMIN) 1000 MCG tablet Take 1,000 mcg by mouth daily.   Yes [provider]  doxycycline (VIBRAMYCIN) 100 MG capsule Take 1 capsule (100 mg total) by mouth 2 (two) times daily. One po bid x 7 days 03/28/17   Daleen Bo, MD  gabapentin (NEURONTIN) 100 MG capsule Take 1 capsule (100 mg total)  by mouth 3 (three) times daily. Patient not taking: Reported on 02/13/2017 01/09/17   Elmarie Shiley, MD    Family History Family History  Problem Relation Age of Onset  . Pneumonia Mother   . Heart attack Father     Social History Social History  Substance Use Topics  . Smoking status: Never Smoker  . Smokeless tobacco: Never Used  . Alcohol use No     Allergies   Ativan [lorazepam]; Flomax [tamsulosin hcl]; and Glucophage [metformin hcl]   Review of Systems Review of Systems  All other systems reviewed and are negative.    Physical Exam Updated Vital Signs BP (!) 152/82   Pulse 80   Temp 97.5 F (36.4 C) (Oral)  Resp 16   SpO2 99%   Physical Exam  Constitutional: He is oriented to person, place, and time. He appears well-developed.  Morbidly obese, elderly male.  HENT:  Head: Normocephalic and atraumatic.  Right Ear: External ear normal.  Left Ear: External ear normal.  Eyes: Conjunctivae and EOM are normal. Pupils are equal, round, and reactive to light.  Neck: Normal range of motion and phonation normal. Neck supple.  Cardiovascular: Normal rate, regular rhythm and normal heart sounds.   Pulmonary/Chest: Effort normal and breath sounds normal. No respiratory distress. He exhibits no bony tenderness.  Abdominal: Soft. He exhibits distension. There is no tenderness.  Musculoskeletal: Normal range of motion.  Mild erythema from ankles to mid foot, bilaterally.  Superficial anterior ulceration, right lower tibial region.  No associated fluctuance drainage or bleeding.  No significant lesions of the toes, but there is significant fungal disease of the nails bilaterally.  No skin breakdown of the plantar aspect of the foot.  Neurological: He is alert and oriented to person, place, and time. No cranial nerve deficit or sensory deficit. He exhibits normal muscle tone. Coordination normal.  Skin: Skin is warm, dry and intact.  Psychiatric: He has a normal mood and  affect. His behavior is normal. Judgment and thought content normal.  Nursing note and vitals reviewed.    ED Treatments / Results  Labs (all labs ordered are listed, but only abnormal results are displayed) Labs Reviewed  CBC WITH DIFFERENTIAL/PLATELET - Abnormal; Notable for the following:       Result Value   Hemoglobin 10.8 (*)    HCT 35.4 (*)    MCV 69.5 (*)    MCH 21.2 (*)    RDW 15.8 (*)    All other components within normal limits  BASIC METABOLIC PANEL - Abnormal; Notable for the following:    Glucose, Bld 183 (*)    Creatinine, Ser 1.27 (*)    GFR calc non Af Amer 50 (*)    GFR calc Af Amer 58 (*)    All other components within normal limits    EKG  EKG Interpretation None       Radiology No results found.  Procedures Procedures (including critical care time)  Medications Ordered in ED Medications  acetaminophen (TYLENOL) tablet 650 mg (not administered)     Initial Impression / Assessment and Plan / ED Course  I have reviewed the triage vital signs and the nursing notes.  Pertinent labs & imaging results that were available during my care of the patient were reviewed by me and considered in my medical decision making (see chart for details).     Medications  acetaminophen (TYLENOL) tablet 650 mg (not administered)    Patient Vitals for the past 24 hrs:  BP Temp Temp src Pulse Resp SpO2  03/28/17 2021 (!) 152/82 - - 80 16 99 %  03/28/17 1838 (!) 142/64 97.5 F (36.4 C) Oral 81 16 100 %  03/28/17 1835 - - - - - 96 %   SPLINT APPLICATION Date/Time: 1:19 PM Authorized by: Richarda Blade Consent: Verbal consent obtained. Risks and benefits: risks, benefits and alternatives were discussed Consent given by: patient Splint applied by: orthopedic technician Location details: Both lower legs/ankles Splint type: Geophysicist/field seismologist used: Dressing material Post-procedure: The splinted body part was neurovascularly unchanged following the  procedure. Patient tolerance: Patient tolerated the procedure well with no immediate complications.     8:49 PM Reevaluation with update and discussion. After initial assessment  and treatment, an updated evaluation reveals no change in clinical status. Amaru Burroughs L    Final Clinical Impressions(s) / ED Diagnoses   Final diagnoses:  Leg swelling   Nonspecific leg swelling with mild erythema.  Doubt frank infection/cellulitis.  Likely related to venous insufficiency and inactivity level.  No evidence for serious bacterial infection, or metabolic instability.  Nursing Notes Reviewed/ Care Coordinated Applicable Imaging Reviewed Interpretation of Laboratory Data incorporated into ED treatment  The patient appears reasonably screened and/or stabilized for discharge and I doubt any other medical condition or other [facility] requiring further screening, evaluation, or treatment in the ED at this time prior to discharge.  Plan: Home Medications-continue usual medications; Home Treatments-rest, elevate feet as much as possible; return here if the recommended treatment, does not improve the symptoms; Recommended follow up-PCP checkup for possible repeat Unna boot dressing, 1 week.    New Prescriptions New Prescriptions   DOXYCYCLINE (VIBRAMYCIN) 100 MG CAPSULE    Take 1 capsule (100 mg total) by mouth 2 (two) times daily. One po bid x 7 days     Daleen Bo, MD 03/29/17 [phone]

## 2017-04-03 DIAGNOSIS — L03119 Cellulitis of unspecified part of limb: Secondary | ICD-10-CM | POA: Diagnosis not present

## 2017-04-03 DIAGNOSIS — I872 Venous insufficiency (chronic) (peripheral): Secondary | ICD-10-CM | POA: Diagnosis not present

## 2017-04-03 DIAGNOSIS — I5032 Chronic diastolic (congestive) heart failure: Secondary | ICD-10-CM | POA: Diagnosis not present

## 2017-04-04 DIAGNOSIS — E1122 Type 2 diabetes mellitus with diabetic chronic kidney disease: Secondary | ICD-10-CM | POA: Diagnosis not present

## 2017-04-04 DIAGNOSIS — I130 Hypertensive heart and chronic kidney disease with heart failure and stage 1 through stage 4 chronic kidney disease, or unspecified chronic kidney disease: Secondary | ICD-10-CM | POA: Diagnosis not present

## 2017-04-04 DIAGNOSIS — I69322 Dysarthria following cerebral infarction: Secondary | ICD-10-CM | POA: Diagnosis not present

## 2017-04-04 DIAGNOSIS — E1142 Type 2 diabetes mellitus with diabetic polyneuropathy: Secondary | ICD-10-CM | POA: Diagnosis not present

## 2017-04-04 DIAGNOSIS — I5042 Chronic combined systolic (congestive) and diastolic (congestive) heart failure: Secondary | ICD-10-CM | POA: Diagnosis not present

## 2017-04-04 DIAGNOSIS — N183 Chronic kidney disease, stage 3 (moderate): Secondary | ICD-10-CM | POA: Diagnosis not present

## 2017-04-06 DIAGNOSIS — I130 Hypertensive heart and chronic kidney disease with heart failure and stage 1 through stage 4 chronic kidney disease, or unspecified chronic kidney disease: Secondary | ICD-10-CM | POA: Diagnosis not present

## 2017-04-06 DIAGNOSIS — N183 Chronic kidney disease, stage 3 (moderate): Secondary | ICD-10-CM | POA: Diagnosis not present

## 2017-04-06 DIAGNOSIS — I5042 Chronic combined systolic (congestive) and diastolic (congestive) heart failure: Secondary | ICD-10-CM | POA: Diagnosis not present

## 2017-04-06 DIAGNOSIS — E1142 Type 2 diabetes mellitus with diabetic polyneuropathy: Secondary | ICD-10-CM | POA: Diagnosis not present

## 2017-04-06 DIAGNOSIS — E1122 Type 2 diabetes mellitus with diabetic chronic kidney disease: Secondary | ICD-10-CM | POA: Diagnosis not present

## 2017-04-06 DIAGNOSIS — I69322 Dysarthria following cerebral infarction: Secondary | ICD-10-CM | POA: Diagnosis not present

## 2017-04-07 DIAGNOSIS — Z792 Long term (current) use of antibiotics: Secondary | ICD-10-CM | POA: Diagnosis not present

## 2017-04-07 DIAGNOSIS — Z7982 Long term (current) use of aspirin: Secondary | ICD-10-CM | POA: Diagnosis not present

## 2017-04-07 DIAGNOSIS — E785 Hyperlipidemia, unspecified: Secondary | ICD-10-CM | POA: Diagnosis not present

## 2017-04-07 DIAGNOSIS — I130 Hypertensive heart and chronic kidney disease with heart failure and stage 1 through stage 4 chronic kidney disease, or unspecified chronic kidney disease: Secondary | ICD-10-CM | POA: Diagnosis not present

## 2017-04-07 DIAGNOSIS — M179 Osteoarthritis of knee, unspecified: Secondary | ICD-10-CM | POA: Diagnosis not present

## 2017-04-07 DIAGNOSIS — I2510 Atherosclerotic heart disease of native coronary artery without angina pectoris: Secondary | ICD-10-CM | POA: Diagnosis not present

## 2017-04-07 DIAGNOSIS — E1122 Type 2 diabetes mellitus with diabetic chronic kidney disease: Secondary | ICD-10-CM | POA: Diagnosis not present

## 2017-04-07 DIAGNOSIS — K219 Gastro-esophageal reflux disease without esophagitis: Secondary | ICD-10-CM | POA: Diagnosis not present

## 2017-04-07 DIAGNOSIS — Z8546 Personal history of malignant neoplasm of prostate: Secondary | ICD-10-CM | POA: Diagnosis not present

## 2017-04-07 DIAGNOSIS — I69322 Dysarthria following cerebral infarction: Secondary | ICD-10-CM | POA: Diagnosis not present

## 2017-04-07 DIAGNOSIS — I69320 Aphasia following cerebral infarction: Secondary | ICD-10-CM | POA: Diagnosis not present

## 2017-04-07 DIAGNOSIS — Z794 Long term (current) use of insulin: Secondary | ICD-10-CM | POA: Diagnosis not present

## 2017-04-07 DIAGNOSIS — I8390 Asymptomatic varicose veins of unspecified lower extremity: Secondary | ICD-10-CM | POA: Diagnosis not present

## 2017-04-07 DIAGNOSIS — Z9981 Dependence on supplemental oxygen: Secondary | ICD-10-CM | POA: Diagnosis not present

## 2017-04-07 DIAGNOSIS — F322 Major depressive disorder, single episode, severe without psychotic features: Secondary | ICD-10-CM | POA: Diagnosis not present

## 2017-04-07 DIAGNOSIS — I5042 Chronic combined systolic (congestive) and diastolic (congestive) heart failure: Secondary | ICD-10-CM | POA: Diagnosis not present

## 2017-04-07 DIAGNOSIS — G4733 Obstructive sleep apnea (adult) (pediatric): Secondary | ICD-10-CM | POA: Diagnosis not present

## 2017-04-07 DIAGNOSIS — L03115 Cellulitis of right lower limb: Secondary | ICD-10-CM | POA: Diagnosis not present

## 2017-04-07 DIAGNOSIS — E1142 Type 2 diabetes mellitus with diabetic polyneuropathy: Secondary | ICD-10-CM | POA: Diagnosis not present

## 2017-04-07 DIAGNOSIS — N183 Chronic kidney disease, stage 3 (moderate): Secondary | ICD-10-CM | POA: Diagnosis not present

## 2017-04-07 DIAGNOSIS — J9612 Chronic respiratory failure with hypercapnia: Secondary | ICD-10-CM | POA: Diagnosis not present

## 2017-04-07 DIAGNOSIS — D649 Anemia, unspecified: Secondary | ICD-10-CM | POA: Diagnosis not present

## 2017-04-07 DIAGNOSIS — I480 Paroxysmal atrial fibrillation: Secondary | ICD-10-CM | POA: Diagnosis not present

## 2017-04-07 DIAGNOSIS — Z7902 Long term (current) use of antithrombotics/antiplatelets: Secondary | ICD-10-CM | POA: Diagnosis not present

## 2017-04-09 DIAGNOSIS — L03115 Cellulitis of right lower limb: Secondary | ICD-10-CM | POA: Diagnosis not present

## 2017-04-09 DIAGNOSIS — E1122 Type 2 diabetes mellitus with diabetic chronic kidney disease: Secondary | ICD-10-CM | POA: Diagnosis not present

## 2017-04-09 DIAGNOSIS — N183 Chronic kidney disease, stage 3 (moderate): Secondary | ICD-10-CM | POA: Diagnosis not present

## 2017-04-09 DIAGNOSIS — I5042 Chronic combined systolic (congestive) and diastolic (congestive) heart failure: Secondary | ICD-10-CM | POA: Diagnosis not present

## 2017-04-09 DIAGNOSIS — I130 Hypertensive heart and chronic kidney disease with heart failure and stage 1 through stage 4 chronic kidney disease, or unspecified chronic kidney disease: Secondary | ICD-10-CM | POA: Diagnosis not present

## 2017-04-09 DIAGNOSIS — I69322 Dysarthria following cerebral infarction: Secondary | ICD-10-CM | POA: Diagnosis not present

## 2017-04-11 DIAGNOSIS — I5042 Chronic combined systolic (congestive) and diastolic (congestive) heart failure: Secondary | ICD-10-CM | POA: Diagnosis not present

## 2017-04-11 DIAGNOSIS — I130 Hypertensive heart and chronic kidney disease with heart failure and stage 1 through stage 4 chronic kidney disease, or unspecified chronic kidney disease: Secondary | ICD-10-CM | POA: Diagnosis not present

## 2017-04-11 DIAGNOSIS — N183 Chronic kidney disease, stage 3 (moderate): Secondary | ICD-10-CM | POA: Diagnosis not present

## 2017-04-11 DIAGNOSIS — L03115 Cellulitis of right lower limb: Secondary | ICD-10-CM | POA: Diagnosis not present

## 2017-04-11 DIAGNOSIS — E1122 Type 2 diabetes mellitus with diabetic chronic kidney disease: Secondary | ICD-10-CM | POA: Diagnosis not present

## 2017-04-11 DIAGNOSIS — I69322 Dysarthria following cerebral infarction: Secondary | ICD-10-CM | POA: Diagnosis not present

## 2017-04-13 DIAGNOSIS — I130 Hypertensive heart and chronic kidney disease with heart failure and stage 1 through stage 4 chronic kidney disease, or unspecified chronic kidney disease: Secondary | ICD-10-CM | POA: Diagnosis not present

## 2017-04-13 DIAGNOSIS — E1122 Type 2 diabetes mellitus with diabetic chronic kidney disease: Secondary | ICD-10-CM | POA: Diagnosis not present

## 2017-04-13 DIAGNOSIS — I5042 Chronic combined systolic (congestive) and diastolic (congestive) heart failure: Secondary | ICD-10-CM | POA: Diagnosis not present

## 2017-04-13 DIAGNOSIS — N183 Chronic kidney disease, stage 3 (moderate): Secondary | ICD-10-CM | POA: Diagnosis not present

## 2017-04-13 DIAGNOSIS — I69322 Dysarthria following cerebral infarction: Secondary | ICD-10-CM | POA: Diagnosis not present

## 2017-04-13 DIAGNOSIS — L03115 Cellulitis of right lower limb: Secondary | ICD-10-CM | POA: Diagnosis not present

## 2017-04-16 DIAGNOSIS — I5042 Chronic combined systolic (congestive) and diastolic (congestive) heart failure: Secondary | ICD-10-CM | POA: Diagnosis not present

## 2017-04-16 DIAGNOSIS — N183 Chronic kidney disease, stage 3 (moderate): Secondary | ICD-10-CM | POA: Diagnosis not present

## 2017-04-16 DIAGNOSIS — E1122 Type 2 diabetes mellitus with diabetic chronic kidney disease: Secondary | ICD-10-CM | POA: Diagnosis not present

## 2017-04-16 DIAGNOSIS — I69322 Dysarthria following cerebral infarction: Secondary | ICD-10-CM | POA: Diagnosis not present

## 2017-04-16 DIAGNOSIS — L03115 Cellulitis of right lower limb: Secondary | ICD-10-CM | POA: Diagnosis not present

## 2017-04-16 DIAGNOSIS — I130 Hypertensive heart and chronic kidney disease with heart failure and stage 1 through stage 4 chronic kidney disease, or unspecified chronic kidney disease: Secondary | ICD-10-CM | POA: Diagnosis not present

## 2017-04-18 DIAGNOSIS — E1122 Type 2 diabetes mellitus with diabetic chronic kidney disease: Secondary | ICD-10-CM | POA: Diagnosis not present

## 2017-04-18 DIAGNOSIS — L03115 Cellulitis of right lower limb: Secondary | ICD-10-CM | POA: Diagnosis not present

## 2017-04-18 DIAGNOSIS — I130 Hypertensive heart and chronic kidney disease with heart failure and stage 1 through stage 4 chronic kidney disease, or unspecified chronic kidney disease: Secondary | ICD-10-CM | POA: Diagnosis not present

## 2017-04-18 DIAGNOSIS — N183 Chronic kidney disease, stage 3 (moderate): Secondary | ICD-10-CM | POA: Diagnosis not present

## 2017-04-18 DIAGNOSIS — I69322 Dysarthria following cerebral infarction: Secondary | ICD-10-CM | POA: Diagnosis not present

## 2017-04-18 DIAGNOSIS — I5042 Chronic combined systolic (congestive) and diastolic (congestive) heart failure: Secondary | ICD-10-CM | POA: Diagnosis not present

## 2017-04-20 DIAGNOSIS — L03115 Cellulitis of right lower limb: Secondary | ICD-10-CM | POA: Diagnosis not present

## 2017-04-20 DIAGNOSIS — I5042 Chronic combined systolic (congestive) and diastolic (congestive) heart failure: Secondary | ICD-10-CM | POA: Diagnosis not present

## 2017-04-20 DIAGNOSIS — I69322 Dysarthria following cerebral infarction: Secondary | ICD-10-CM | POA: Diagnosis not present

## 2017-04-20 DIAGNOSIS — E1122 Type 2 diabetes mellitus with diabetic chronic kidney disease: Secondary | ICD-10-CM | POA: Diagnosis not present

## 2017-04-20 DIAGNOSIS — I130 Hypertensive heart and chronic kidney disease with heart failure and stage 1 through stage 4 chronic kidney disease, or unspecified chronic kidney disease: Secondary | ICD-10-CM | POA: Diagnosis not present

## 2017-04-20 DIAGNOSIS — N183 Chronic kidney disease, stage 3 (moderate): Secondary | ICD-10-CM | POA: Diagnosis not present

## 2017-04-23 DIAGNOSIS — I69322 Dysarthria following cerebral infarction: Secondary | ICD-10-CM | POA: Diagnosis not present

## 2017-04-23 DIAGNOSIS — I5042 Chronic combined systolic (congestive) and diastolic (congestive) heart failure: Secondary | ICD-10-CM | POA: Diagnosis not present

## 2017-04-23 DIAGNOSIS — E1122 Type 2 diabetes mellitus with diabetic chronic kidney disease: Secondary | ICD-10-CM | POA: Diagnosis not present

## 2017-04-23 DIAGNOSIS — N183 Chronic kidney disease, stage 3 (moderate): Secondary | ICD-10-CM | POA: Diagnosis not present

## 2017-04-23 DIAGNOSIS — L03115 Cellulitis of right lower limb: Secondary | ICD-10-CM | POA: Diagnosis not present

## 2017-04-23 DIAGNOSIS — I130 Hypertensive heart and chronic kidney disease with heart failure and stage 1 through stage 4 chronic kidney disease, or unspecified chronic kidney disease: Secondary | ICD-10-CM | POA: Diagnosis not present

## 2017-04-25 DIAGNOSIS — L03115 Cellulitis of right lower limb: Secondary | ICD-10-CM | POA: Diagnosis not present

## 2017-04-25 DIAGNOSIS — I69322 Dysarthria following cerebral infarction: Secondary | ICD-10-CM | POA: Diagnosis not present

## 2017-04-25 DIAGNOSIS — E1122 Type 2 diabetes mellitus with diabetic chronic kidney disease: Secondary | ICD-10-CM | POA: Diagnosis not present

## 2017-04-25 DIAGNOSIS — I130 Hypertensive heart and chronic kidney disease with heart failure and stage 1 through stage 4 chronic kidney disease, or unspecified chronic kidney disease: Secondary | ICD-10-CM | POA: Diagnosis not present

## 2017-04-25 DIAGNOSIS — N183 Chronic kidney disease, stage 3 (moderate): Secondary | ICD-10-CM | POA: Diagnosis not present

## 2017-04-25 DIAGNOSIS — I5042 Chronic combined systolic (congestive) and diastolic (congestive) heart failure: Secondary | ICD-10-CM | POA: Diagnosis not present

## 2017-04-27 DIAGNOSIS — I130 Hypertensive heart and chronic kidney disease with heart failure and stage 1 through stage 4 chronic kidney disease, or unspecified chronic kidney disease: Secondary | ICD-10-CM | POA: Diagnosis not present

## 2017-04-27 DIAGNOSIS — I69322 Dysarthria following cerebral infarction: Secondary | ICD-10-CM | POA: Diagnosis not present

## 2017-04-27 DIAGNOSIS — E1122 Type 2 diabetes mellitus with diabetic chronic kidney disease: Secondary | ICD-10-CM | POA: Diagnosis not present

## 2017-04-27 DIAGNOSIS — L03115 Cellulitis of right lower limb: Secondary | ICD-10-CM | POA: Diagnosis not present

## 2017-04-27 DIAGNOSIS — I5042 Chronic combined systolic (congestive) and diastolic (congestive) heart failure: Secondary | ICD-10-CM | POA: Diagnosis not present

## 2017-04-27 DIAGNOSIS — N183 Chronic kidney disease, stage 3 (moderate): Secondary | ICD-10-CM | POA: Diagnosis not present

## 2017-04-30 DIAGNOSIS — L03115 Cellulitis of right lower limb: Secondary | ICD-10-CM | POA: Diagnosis not present

## 2017-04-30 DIAGNOSIS — I5042 Chronic combined systolic (congestive) and diastolic (congestive) heart failure: Secondary | ICD-10-CM | POA: Diagnosis not present

## 2017-04-30 DIAGNOSIS — I130 Hypertensive heart and chronic kidney disease with heart failure and stage 1 through stage 4 chronic kidney disease, or unspecified chronic kidney disease: Secondary | ICD-10-CM | POA: Diagnosis not present

## 2017-04-30 DIAGNOSIS — I69322 Dysarthria following cerebral infarction: Secondary | ICD-10-CM | POA: Diagnosis not present

## 2017-04-30 DIAGNOSIS — E1122 Type 2 diabetes mellitus with diabetic chronic kidney disease: Secondary | ICD-10-CM | POA: Diagnosis not present

## 2017-04-30 DIAGNOSIS — N183 Chronic kidney disease, stage 3 (moderate): Secondary | ICD-10-CM | POA: Diagnosis not present

## 2017-05-03 DIAGNOSIS — N183 Chronic kidney disease, stage 3 (moderate): Secondary | ICD-10-CM | POA: Diagnosis not present

## 2017-05-03 DIAGNOSIS — I69322 Dysarthria following cerebral infarction: Secondary | ICD-10-CM | POA: Diagnosis not present

## 2017-05-03 DIAGNOSIS — I5042 Chronic combined systolic (congestive) and diastolic (congestive) heart failure: Secondary | ICD-10-CM | POA: Diagnosis not present

## 2017-05-03 DIAGNOSIS — I130 Hypertensive heart and chronic kidney disease with heart failure and stage 1 through stage 4 chronic kidney disease, or unspecified chronic kidney disease: Secondary | ICD-10-CM | POA: Diagnosis not present

## 2017-05-03 DIAGNOSIS — I6789 Other cerebrovascular disease: Secondary | ICD-10-CM | POA: Diagnosis not present

## 2017-05-03 DIAGNOSIS — E1122 Type 2 diabetes mellitus with diabetic chronic kidney disease: Secondary | ICD-10-CM | POA: Diagnosis not present

## 2017-05-03 DIAGNOSIS — L03119 Cellulitis of unspecified part of limb: Secondary | ICD-10-CM | POA: Diagnosis not present

## 2017-05-03 DIAGNOSIS — E1149 Type 2 diabetes mellitus with other diabetic neurological complication: Secondary | ICD-10-CM | POA: Diagnosis not present

## 2017-05-03 DIAGNOSIS — I5032 Chronic diastolic (congestive) heart failure: Secondary | ICD-10-CM | POA: Diagnosis not present

## 2017-05-03 DIAGNOSIS — L03115 Cellulitis of right lower limb: Secondary | ICD-10-CM | POA: Diagnosis not present

## 2017-05-03 DIAGNOSIS — I872 Venous insufficiency (chronic) (peripheral): Secondary | ICD-10-CM | POA: Diagnosis not present

## 2017-05-03 DIAGNOSIS — E1165 Type 2 diabetes mellitus with hyperglycemia: Secondary | ICD-10-CM | POA: Diagnosis not present

## 2017-05-03 DIAGNOSIS — E538 Deficiency of other specified B group vitamins: Secondary | ICD-10-CM | POA: Diagnosis not present

## 2017-05-04 DIAGNOSIS — E1122 Type 2 diabetes mellitus with diabetic chronic kidney disease: Secondary | ICD-10-CM | POA: Diagnosis not present

## 2017-05-04 DIAGNOSIS — I5042 Chronic combined systolic (congestive) and diastolic (congestive) heart failure: Secondary | ICD-10-CM | POA: Diagnosis not present

## 2017-05-04 DIAGNOSIS — L03115 Cellulitis of right lower limb: Secondary | ICD-10-CM | POA: Diagnosis not present

## 2017-05-04 DIAGNOSIS — I130 Hypertensive heart and chronic kidney disease with heart failure and stage 1 through stage 4 chronic kidney disease, or unspecified chronic kidney disease: Secondary | ICD-10-CM | POA: Diagnosis not present

## 2017-05-04 DIAGNOSIS — N183 Chronic kidney disease, stage 3 (moderate): Secondary | ICD-10-CM | POA: Diagnosis not present

## 2017-05-04 DIAGNOSIS — I69322 Dysarthria following cerebral infarction: Secondary | ICD-10-CM | POA: Diagnosis not present

## 2017-05-11 DIAGNOSIS — E1122 Type 2 diabetes mellitus with diabetic chronic kidney disease: Secondary | ICD-10-CM | POA: Diagnosis not present

## 2017-05-11 DIAGNOSIS — I5042 Chronic combined systolic (congestive) and diastolic (congestive) heart failure: Secondary | ICD-10-CM | POA: Diagnosis not present

## 2017-05-11 DIAGNOSIS — L03115 Cellulitis of right lower limb: Secondary | ICD-10-CM | POA: Diagnosis not present

## 2017-05-11 DIAGNOSIS — I130 Hypertensive heart and chronic kidney disease with heart failure and stage 1 through stage 4 chronic kidney disease, or unspecified chronic kidney disease: Secondary | ICD-10-CM | POA: Diagnosis not present

## 2017-05-11 DIAGNOSIS — N183 Chronic kidney disease, stage 3 (moderate): Secondary | ICD-10-CM | POA: Diagnosis not present

## 2017-05-11 DIAGNOSIS — I69322 Dysarthria following cerebral infarction: Secondary | ICD-10-CM | POA: Diagnosis not present

## 2017-05-18 DIAGNOSIS — I69322 Dysarthria following cerebral infarction: Secondary | ICD-10-CM | POA: Diagnosis not present

## 2017-05-18 DIAGNOSIS — E1122 Type 2 diabetes mellitus with diabetic chronic kidney disease: Secondary | ICD-10-CM | POA: Diagnosis not present

## 2017-05-18 DIAGNOSIS — I5042 Chronic combined systolic (congestive) and diastolic (congestive) heart failure: Secondary | ICD-10-CM | POA: Diagnosis not present

## 2017-05-18 DIAGNOSIS — N183 Chronic kidney disease, stage 3 (moderate): Secondary | ICD-10-CM | POA: Diagnosis not present

## 2017-05-18 DIAGNOSIS — L03115 Cellulitis of right lower limb: Secondary | ICD-10-CM | POA: Diagnosis not present

## 2017-05-18 DIAGNOSIS — I130 Hypertensive heart and chronic kidney disease with heart failure and stage 1 through stage 4 chronic kidney disease, or unspecified chronic kidney disease: Secondary | ICD-10-CM | POA: Diagnosis not present

## 2017-05-21 DIAGNOSIS — I130 Hypertensive heart and chronic kidney disease with heart failure and stage 1 through stage 4 chronic kidney disease, or unspecified chronic kidney disease: Secondary | ICD-10-CM | POA: Diagnosis not present

## 2017-05-21 DIAGNOSIS — L03115 Cellulitis of right lower limb: Secondary | ICD-10-CM | POA: Diagnosis not present

## 2017-05-21 DIAGNOSIS — E1122 Type 2 diabetes mellitus with diabetic chronic kidney disease: Secondary | ICD-10-CM | POA: Diagnosis not present

## 2017-05-21 DIAGNOSIS — I5042 Chronic combined systolic (congestive) and diastolic (congestive) heart failure: Secondary | ICD-10-CM | POA: Diagnosis not present

## 2017-05-23 DIAGNOSIS — N183 Chronic kidney disease, stage 3 (moderate): Secondary | ICD-10-CM | POA: Diagnosis not present

## 2017-05-23 DIAGNOSIS — L03115 Cellulitis of right lower limb: Secondary | ICD-10-CM | POA: Diagnosis not present

## 2017-05-23 DIAGNOSIS — E1122 Type 2 diabetes mellitus with diabetic chronic kidney disease: Secondary | ICD-10-CM | POA: Diagnosis not present

## 2017-05-23 DIAGNOSIS — I5042 Chronic combined systolic (congestive) and diastolic (congestive) heart failure: Secondary | ICD-10-CM | POA: Diagnosis not present

## 2017-05-23 DIAGNOSIS — I69322 Dysarthria following cerebral infarction: Secondary | ICD-10-CM | POA: Diagnosis not present

## 2017-05-23 DIAGNOSIS — I130 Hypertensive heart and chronic kidney disease with heart failure and stage 1 through stage 4 chronic kidney disease, or unspecified chronic kidney disease: Secondary | ICD-10-CM | POA: Diagnosis not present

## 2017-05-29 DIAGNOSIS — I69322 Dysarthria following cerebral infarction: Secondary | ICD-10-CM | POA: Diagnosis not present

## 2017-05-29 DIAGNOSIS — I5042 Chronic combined systolic (congestive) and diastolic (congestive) heart failure: Secondary | ICD-10-CM | POA: Diagnosis not present

## 2017-05-29 DIAGNOSIS — E1122 Type 2 diabetes mellitus with diabetic chronic kidney disease: Secondary | ICD-10-CM | POA: Diagnosis not present

## 2017-05-29 DIAGNOSIS — N183 Chronic kidney disease, stage 3 (moderate): Secondary | ICD-10-CM | POA: Diagnosis not present

## 2017-05-29 DIAGNOSIS — I130 Hypertensive heart and chronic kidney disease with heart failure and stage 1 through stage 4 chronic kidney disease, or unspecified chronic kidney disease: Secondary | ICD-10-CM | POA: Diagnosis not present

## 2017-05-29 DIAGNOSIS — L03115 Cellulitis of right lower limb: Secondary | ICD-10-CM | POA: Diagnosis not present

## 2017-07-24 DIAGNOSIS — R197 Diarrhea, unspecified: Secondary | ICD-10-CM | POA: Diagnosis not present

## 2017-07-24 DIAGNOSIS — L309 Dermatitis, unspecified: Secondary | ICD-10-CM | POA: Diagnosis not present

## 2017-07-24 DIAGNOSIS — R51 Headache: Secondary | ICD-10-CM | POA: Diagnosis not present

## 2017-09-07 DIAGNOSIS — I130 Hypertensive heart and chronic kidney disease with heart failure and stage 1 through stage 4 chronic kidney disease, or unspecified chronic kidney disease: Secondary | ICD-10-CM | POA: Diagnosis not present

## 2017-09-07 DIAGNOSIS — E1122 Type 2 diabetes mellitus with diabetic chronic kidney disease: Secondary | ICD-10-CM | POA: Diagnosis not present

## 2017-09-07 DIAGNOSIS — E1142 Type 2 diabetes mellitus with diabetic polyneuropathy: Secondary | ICD-10-CM | POA: Diagnosis not present

## 2017-09-07 DIAGNOSIS — M170 Bilateral primary osteoarthritis of knee: Secondary | ICD-10-CM | POA: Diagnosis not present

## 2017-09-07 DIAGNOSIS — I5030 Unspecified diastolic (congestive) heart failure: Secondary | ICD-10-CM | POA: Diagnosis not present

## 2017-09-07 DIAGNOSIS — R600 Localized edema: Secondary | ICD-10-CM | POA: Diagnosis not present

## 2017-09-09 DIAGNOSIS — M170 Bilateral primary osteoarthritis of knee: Secondary | ICD-10-CM | POA: Diagnosis not present

## 2017-09-09 DIAGNOSIS — E1122 Type 2 diabetes mellitus with diabetic chronic kidney disease: Secondary | ICD-10-CM | POA: Diagnosis not present

## 2017-09-09 DIAGNOSIS — I130 Hypertensive heart and chronic kidney disease with heart failure and stage 1 through stage 4 chronic kidney disease, or unspecified chronic kidney disease: Secondary | ICD-10-CM | POA: Diagnosis not present

## 2017-09-09 DIAGNOSIS — I5030 Unspecified diastolic (congestive) heart failure: Secondary | ICD-10-CM | POA: Diagnosis not present

## 2017-09-09 DIAGNOSIS — R600 Localized edema: Secondary | ICD-10-CM | POA: Diagnosis not present

## 2017-09-09 DIAGNOSIS — E1142 Type 2 diabetes mellitus with diabetic polyneuropathy: Secondary | ICD-10-CM | POA: Diagnosis not present

## 2017-09-11 DIAGNOSIS — E1122 Type 2 diabetes mellitus with diabetic chronic kidney disease: Secondary | ICD-10-CM | POA: Diagnosis not present

## 2017-09-11 DIAGNOSIS — M170 Bilateral primary osteoarthritis of knee: Secondary | ICD-10-CM | POA: Diagnosis not present

## 2017-09-11 DIAGNOSIS — I130 Hypertensive heart and chronic kidney disease with heart failure and stage 1 through stage 4 chronic kidney disease, or unspecified chronic kidney disease: Secondary | ICD-10-CM | POA: Diagnosis not present

## 2017-09-11 DIAGNOSIS — R600 Localized edema: Secondary | ICD-10-CM | POA: Diagnosis not present

## 2017-09-11 DIAGNOSIS — E1142 Type 2 diabetes mellitus with diabetic polyneuropathy: Secondary | ICD-10-CM | POA: Diagnosis not present

## 2017-09-11 DIAGNOSIS — I5030 Unspecified diastolic (congestive) heart failure: Secondary | ICD-10-CM | POA: Diagnosis not present

## 2017-09-12 DIAGNOSIS — I5030 Unspecified diastolic (congestive) heart failure: Secondary | ICD-10-CM | POA: Diagnosis not present

## 2017-09-12 DIAGNOSIS — M170 Bilateral primary osteoarthritis of knee: Secondary | ICD-10-CM | POA: Diagnosis not present

## 2017-09-12 DIAGNOSIS — E1142 Type 2 diabetes mellitus with diabetic polyneuropathy: Secondary | ICD-10-CM | POA: Diagnosis not present

## 2017-09-12 DIAGNOSIS — R600 Localized edema: Secondary | ICD-10-CM | POA: Diagnosis not present

## 2017-09-12 DIAGNOSIS — E1122 Type 2 diabetes mellitus with diabetic chronic kidney disease: Secondary | ICD-10-CM | POA: Diagnosis not present

## 2017-09-12 DIAGNOSIS — I130 Hypertensive heart and chronic kidney disease with heart failure and stage 1 through stage 4 chronic kidney disease, or unspecified chronic kidney disease: Secondary | ICD-10-CM | POA: Diagnosis not present

## 2017-09-13 DIAGNOSIS — L309 Dermatitis, unspecified: Secondary | ICD-10-CM | POA: Diagnosis not present

## 2017-09-13 DIAGNOSIS — E1165 Type 2 diabetes mellitus with hyperglycemia: Secondary | ICD-10-CM | POA: Diagnosis not present

## 2017-09-13 DIAGNOSIS — M170 Bilateral primary osteoarthritis of knee: Secondary | ICD-10-CM | POA: Diagnosis not present

## 2017-09-13 DIAGNOSIS — E1142 Type 2 diabetes mellitus with diabetic polyneuropathy: Secondary | ICD-10-CM | POA: Diagnosis not present

## 2017-09-13 DIAGNOSIS — I5042 Chronic combined systolic (congestive) and diastolic (congestive) heart failure: Secondary | ICD-10-CM | POA: Diagnosis not present

## 2017-09-13 DIAGNOSIS — E1122 Type 2 diabetes mellitus with diabetic chronic kidney disease: Secondary | ICD-10-CM | POA: Diagnosis not present

## 2017-09-13 DIAGNOSIS — R600 Localized edema: Secondary | ICD-10-CM | POA: Diagnosis not present

## 2017-09-13 DIAGNOSIS — Z23 Encounter for immunization: Secondary | ICD-10-CM | POA: Diagnosis not present

## 2017-09-13 DIAGNOSIS — I8311 Varicose veins of right lower extremity with inflammation: Secondary | ICD-10-CM | POA: Diagnosis not present

## 2017-09-13 DIAGNOSIS — I130 Hypertensive heart and chronic kidney disease with heart failure and stage 1 through stage 4 chronic kidney disease, or unspecified chronic kidney disease: Secondary | ICD-10-CM | POA: Diagnosis not present

## 2017-09-13 DIAGNOSIS — I5030 Unspecified diastolic (congestive) heart failure: Secondary | ICD-10-CM | POA: Diagnosis not present

## 2017-09-13 DIAGNOSIS — Z794 Long term (current) use of insulin: Secondary | ICD-10-CM | POA: Diagnosis not present

## 2017-09-17 DIAGNOSIS — E1122 Type 2 diabetes mellitus with diabetic chronic kidney disease: Secondary | ICD-10-CM | POA: Diagnosis not present

## 2017-09-17 DIAGNOSIS — I5030 Unspecified diastolic (congestive) heart failure: Secondary | ICD-10-CM | POA: Diagnosis not present

## 2017-09-17 DIAGNOSIS — M170 Bilateral primary osteoarthritis of knee: Secondary | ICD-10-CM | POA: Diagnosis not present

## 2017-09-17 DIAGNOSIS — R600 Localized edema: Secondary | ICD-10-CM | POA: Diagnosis not present

## 2017-09-17 DIAGNOSIS — I130 Hypertensive heart and chronic kidney disease with heart failure and stage 1 through stage 4 chronic kidney disease, or unspecified chronic kidney disease: Secondary | ICD-10-CM | POA: Diagnosis not present

## 2017-09-17 DIAGNOSIS — E1142 Type 2 diabetes mellitus with diabetic polyneuropathy: Secondary | ICD-10-CM | POA: Diagnosis not present

## 2017-09-18 DIAGNOSIS — E1122 Type 2 diabetes mellitus with diabetic chronic kidney disease: Secondary | ICD-10-CM | POA: Diagnosis not present

## 2017-09-18 DIAGNOSIS — M170 Bilateral primary osteoarthritis of knee: Secondary | ICD-10-CM | POA: Diagnosis not present

## 2017-09-18 DIAGNOSIS — I130 Hypertensive heart and chronic kidney disease with heart failure and stage 1 through stage 4 chronic kidney disease, or unspecified chronic kidney disease: Secondary | ICD-10-CM | POA: Diagnosis not present

## 2017-09-18 DIAGNOSIS — E1142 Type 2 diabetes mellitus with diabetic polyneuropathy: Secondary | ICD-10-CM | POA: Diagnosis not present

## 2017-09-18 DIAGNOSIS — R600 Localized edema: Secondary | ICD-10-CM | POA: Diagnosis not present

## 2017-09-18 DIAGNOSIS — I5030 Unspecified diastolic (congestive) heart failure: Secondary | ICD-10-CM | POA: Diagnosis not present

## 2017-09-20 DIAGNOSIS — I130 Hypertensive heart and chronic kidney disease with heart failure and stage 1 through stage 4 chronic kidney disease, or unspecified chronic kidney disease: Secondary | ICD-10-CM | POA: Diagnosis not present

## 2017-09-20 DIAGNOSIS — E1142 Type 2 diabetes mellitus with diabetic polyneuropathy: Secondary | ICD-10-CM | POA: Diagnosis not present

## 2017-09-20 DIAGNOSIS — I5030 Unspecified diastolic (congestive) heart failure: Secondary | ICD-10-CM | POA: Diagnosis not present

## 2017-09-20 DIAGNOSIS — E1122 Type 2 diabetes mellitus with diabetic chronic kidney disease: Secondary | ICD-10-CM | POA: Diagnosis not present

## 2017-09-20 DIAGNOSIS — M170 Bilateral primary osteoarthritis of knee: Secondary | ICD-10-CM | POA: Diagnosis not present

## 2017-09-20 DIAGNOSIS — R600 Localized edema: Secondary | ICD-10-CM | POA: Diagnosis not present

## 2017-09-24 DIAGNOSIS — I130 Hypertensive heart and chronic kidney disease with heart failure and stage 1 through stage 4 chronic kidney disease, or unspecified chronic kidney disease: Secondary | ICD-10-CM | POA: Diagnosis not present

## 2017-09-24 DIAGNOSIS — E1142 Type 2 diabetes mellitus with diabetic polyneuropathy: Secondary | ICD-10-CM | POA: Diagnosis not present

## 2017-09-24 DIAGNOSIS — R600 Localized edema: Secondary | ICD-10-CM | POA: Diagnosis not present

## 2017-09-24 DIAGNOSIS — E1122 Type 2 diabetes mellitus with diabetic chronic kidney disease: Secondary | ICD-10-CM | POA: Diagnosis not present

## 2017-09-24 DIAGNOSIS — M170 Bilateral primary osteoarthritis of knee: Secondary | ICD-10-CM | POA: Diagnosis not present

## 2017-09-24 DIAGNOSIS — I5030 Unspecified diastolic (congestive) heart failure: Secondary | ICD-10-CM | POA: Diagnosis not present

## 2017-09-26 DIAGNOSIS — E1142 Type 2 diabetes mellitus with diabetic polyneuropathy: Secondary | ICD-10-CM | POA: Diagnosis not present

## 2017-09-26 DIAGNOSIS — R600 Localized edema: Secondary | ICD-10-CM | POA: Diagnosis not present

## 2017-09-26 DIAGNOSIS — I5030 Unspecified diastolic (congestive) heart failure: Secondary | ICD-10-CM | POA: Diagnosis not present

## 2017-09-26 DIAGNOSIS — M170 Bilateral primary osteoarthritis of knee: Secondary | ICD-10-CM | POA: Diagnosis not present

## 2017-09-26 DIAGNOSIS — E1122 Type 2 diabetes mellitus with diabetic chronic kidney disease: Secondary | ICD-10-CM | POA: Diagnosis not present

## 2017-09-26 DIAGNOSIS — I130 Hypertensive heart and chronic kidney disease with heart failure and stage 1 through stage 4 chronic kidney disease, or unspecified chronic kidney disease: Secondary | ICD-10-CM | POA: Diagnosis not present

## 2017-09-27 DIAGNOSIS — I130 Hypertensive heart and chronic kidney disease with heart failure and stage 1 through stage 4 chronic kidney disease, or unspecified chronic kidney disease: Secondary | ICD-10-CM | POA: Diagnosis not present

## 2017-09-27 DIAGNOSIS — E1122 Type 2 diabetes mellitus with diabetic chronic kidney disease: Secondary | ICD-10-CM | POA: Diagnosis not present

## 2017-09-27 DIAGNOSIS — I5030 Unspecified diastolic (congestive) heart failure: Secondary | ICD-10-CM | POA: Diagnosis not present

## 2017-09-27 DIAGNOSIS — E1142 Type 2 diabetes mellitus with diabetic polyneuropathy: Secondary | ICD-10-CM | POA: Diagnosis not present

## 2017-09-27 DIAGNOSIS — M170 Bilateral primary osteoarthritis of knee: Secondary | ICD-10-CM | POA: Diagnosis not present

## 2017-09-27 DIAGNOSIS — R600 Localized edema: Secondary | ICD-10-CM | POA: Diagnosis not present

## 2017-09-28 DIAGNOSIS — R600 Localized edema: Secondary | ICD-10-CM | POA: Diagnosis not present

## 2017-09-28 DIAGNOSIS — I130 Hypertensive heart and chronic kidney disease with heart failure and stage 1 through stage 4 chronic kidney disease, or unspecified chronic kidney disease: Secondary | ICD-10-CM | POA: Diagnosis not present

## 2017-09-28 DIAGNOSIS — M170 Bilateral primary osteoarthritis of knee: Secondary | ICD-10-CM | POA: Diagnosis not present

## 2017-09-28 DIAGNOSIS — I5030 Unspecified diastolic (congestive) heart failure: Secondary | ICD-10-CM | POA: Diagnosis not present

## 2017-09-28 DIAGNOSIS — E1142 Type 2 diabetes mellitus with diabetic polyneuropathy: Secondary | ICD-10-CM | POA: Diagnosis not present

## 2017-09-28 DIAGNOSIS — E1122 Type 2 diabetes mellitus with diabetic chronic kidney disease: Secondary | ICD-10-CM | POA: Diagnosis not present

## 2017-10-01 DIAGNOSIS — E1142 Type 2 diabetes mellitus with diabetic polyneuropathy: Secondary | ICD-10-CM | POA: Diagnosis not present

## 2017-10-01 DIAGNOSIS — E1122 Type 2 diabetes mellitus with diabetic chronic kidney disease: Secondary | ICD-10-CM | POA: Diagnosis not present

## 2017-10-01 DIAGNOSIS — R600 Localized edema: Secondary | ICD-10-CM | POA: Diagnosis not present

## 2017-10-01 DIAGNOSIS — I130 Hypertensive heart and chronic kidney disease with heart failure and stage 1 through stage 4 chronic kidney disease, or unspecified chronic kidney disease: Secondary | ICD-10-CM | POA: Diagnosis not present

## 2017-10-01 DIAGNOSIS — M170 Bilateral primary osteoarthritis of knee: Secondary | ICD-10-CM | POA: Diagnosis not present

## 2017-10-01 DIAGNOSIS — I5030 Unspecified diastolic (congestive) heart failure: Secondary | ICD-10-CM | POA: Diagnosis not present

## 2017-10-04 DIAGNOSIS — E1142 Type 2 diabetes mellitus with diabetic polyneuropathy: Secondary | ICD-10-CM | POA: Diagnosis not present

## 2017-10-04 DIAGNOSIS — E1122 Type 2 diabetes mellitus with diabetic chronic kidney disease: Secondary | ICD-10-CM | POA: Diagnosis not present

## 2017-10-04 DIAGNOSIS — R600 Localized edema: Secondary | ICD-10-CM | POA: Diagnosis not present

## 2017-10-04 DIAGNOSIS — M170 Bilateral primary osteoarthritis of knee: Secondary | ICD-10-CM | POA: Diagnosis not present

## 2017-10-04 DIAGNOSIS — I130 Hypertensive heart and chronic kidney disease with heart failure and stage 1 through stage 4 chronic kidney disease, or unspecified chronic kidney disease: Secondary | ICD-10-CM | POA: Diagnosis not present

## 2017-10-04 DIAGNOSIS — I5030 Unspecified diastolic (congestive) heart failure: Secondary | ICD-10-CM | POA: Diagnosis not present

## 2017-10-05 DIAGNOSIS — E1142 Type 2 diabetes mellitus with diabetic polyneuropathy: Secondary | ICD-10-CM | POA: Diagnosis not present

## 2017-10-05 DIAGNOSIS — M170 Bilateral primary osteoarthritis of knee: Secondary | ICD-10-CM | POA: Diagnosis not present

## 2017-10-05 DIAGNOSIS — E1122 Type 2 diabetes mellitus with diabetic chronic kidney disease: Secondary | ICD-10-CM | POA: Diagnosis not present

## 2017-10-05 DIAGNOSIS — I5030 Unspecified diastolic (congestive) heart failure: Secondary | ICD-10-CM | POA: Diagnosis not present

## 2017-10-05 DIAGNOSIS — I130 Hypertensive heart and chronic kidney disease with heart failure and stage 1 through stage 4 chronic kidney disease, or unspecified chronic kidney disease: Secondary | ICD-10-CM | POA: Diagnosis not present

## 2017-10-05 DIAGNOSIS — R600 Localized edema: Secondary | ICD-10-CM | POA: Diagnosis not present

## 2017-10-09 DIAGNOSIS — E1142 Type 2 diabetes mellitus with diabetic polyneuropathy: Secondary | ICD-10-CM | POA: Diagnosis not present

## 2017-10-09 DIAGNOSIS — M170 Bilateral primary osteoarthritis of knee: Secondary | ICD-10-CM | POA: Diagnosis not present

## 2017-10-09 DIAGNOSIS — E1122 Type 2 diabetes mellitus with diabetic chronic kidney disease: Secondary | ICD-10-CM | POA: Diagnosis not present

## 2017-10-09 DIAGNOSIS — R600 Localized edema: Secondary | ICD-10-CM | POA: Diagnosis not present

## 2017-10-09 DIAGNOSIS — I5030 Unspecified diastolic (congestive) heart failure: Secondary | ICD-10-CM | POA: Diagnosis not present

## 2017-10-09 DIAGNOSIS — I130 Hypertensive heart and chronic kidney disease with heart failure and stage 1 through stage 4 chronic kidney disease, or unspecified chronic kidney disease: Secondary | ICD-10-CM | POA: Diagnosis not present

## 2017-10-11 DIAGNOSIS — M170 Bilateral primary osteoarthritis of knee: Secondary | ICD-10-CM | POA: Diagnosis not present

## 2017-10-11 DIAGNOSIS — R600 Localized edema: Secondary | ICD-10-CM | POA: Diagnosis not present

## 2017-10-11 DIAGNOSIS — I130 Hypertensive heart and chronic kidney disease with heart failure and stage 1 through stage 4 chronic kidney disease, or unspecified chronic kidney disease: Secondary | ICD-10-CM | POA: Diagnosis not present

## 2017-10-11 DIAGNOSIS — E1122 Type 2 diabetes mellitus with diabetic chronic kidney disease: Secondary | ICD-10-CM | POA: Diagnosis not present

## 2017-10-11 DIAGNOSIS — E1142 Type 2 diabetes mellitus with diabetic polyneuropathy: Secondary | ICD-10-CM | POA: Diagnosis not present

## 2017-10-11 DIAGNOSIS — I5030 Unspecified diastolic (congestive) heart failure: Secondary | ICD-10-CM | POA: Diagnosis not present

## 2017-10-12 DIAGNOSIS — E1122 Type 2 diabetes mellitus with diabetic chronic kidney disease: Secondary | ICD-10-CM | POA: Diagnosis not present

## 2017-10-12 DIAGNOSIS — I130 Hypertensive heart and chronic kidney disease with heart failure and stage 1 through stage 4 chronic kidney disease, or unspecified chronic kidney disease: Secondary | ICD-10-CM | POA: Diagnosis not present

## 2017-10-12 DIAGNOSIS — I5030 Unspecified diastolic (congestive) heart failure: Secondary | ICD-10-CM | POA: Diagnosis not present

## 2017-10-12 DIAGNOSIS — M170 Bilateral primary osteoarthritis of knee: Secondary | ICD-10-CM | POA: Diagnosis not present

## 2017-10-12 DIAGNOSIS — E1142 Type 2 diabetes mellitus with diabetic polyneuropathy: Secondary | ICD-10-CM | POA: Diagnosis not present

## 2017-10-12 DIAGNOSIS — R600 Localized edema: Secondary | ICD-10-CM | POA: Diagnosis not present

## 2017-10-15 DIAGNOSIS — E1142 Type 2 diabetes mellitus with diabetic polyneuropathy: Secondary | ICD-10-CM | POA: Diagnosis not present

## 2017-10-15 DIAGNOSIS — I130 Hypertensive heart and chronic kidney disease with heart failure and stage 1 through stage 4 chronic kidney disease, or unspecified chronic kidney disease: Secondary | ICD-10-CM | POA: Diagnosis not present

## 2017-10-15 DIAGNOSIS — E1122 Type 2 diabetes mellitus with diabetic chronic kidney disease: Secondary | ICD-10-CM | POA: Diagnosis not present

## 2017-10-15 DIAGNOSIS — I5030 Unspecified diastolic (congestive) heart failure: Secondary | ICD-10-CM | POA: Diagnosis not present

## 2017-10-15 DIAGNOSIS — M170 Bilateral primary osteoarthritis of knee: Secondary | ICD-10-CM | POA: Diagnosis not present

## 2017-10-15 DIAGNOSIS — R600 Localized edema: Secondary | ICD-10-CM | POA: Diagnosis not present

## 2017-10-16 DIAGNOSIS — R600 Localized edema: Secondary | ICD-10-CM | POA: Diagnosis not present

## 2017-10-16 DIAGNOSIS — M170 Bilateral primary osteoarthritis of knee: Secondary | ICD-10-CM | POA: Diagnosis not present

## 2017-10-16 DIAGNOSIS — I5030 Unspecified diastolic (congestive) heart failure: Secondary | ICD-10-CM | POA: Diagnosis not present

## 2017-10-16 DIAGNOSIS — E1122 Type 2 diabetes mellitus with diabetic chronic kidney disease: Secondary | ICD-10-CM | POA: Diagnosis not present

## 2017-10-16 DIAGNOSIS — E1142 Type 2 diabetes mellitus with diabetic polyneuropathy: Secondary | ICD-10-CM | POA: Diagnosis not present

## 2017-10-16 DIAGNOSIS — I130 Hypertensive heart and chronic kidney disease with heart failure and stage 1 through stage 4 chronic kidney disease, or unspecified chronic kidney disease: Secondary | ICD-10-CM | POA: Diagnosis not present

## 2017-10-17 ENCOUNTER — Inpatient Hospital Stay (HOSPITAL_COMMUNITY)
Admission: EM | Admit: 2017-10-17 | Discharge: 2017-10-20 | DRG: 603 | Disposition: A | Discharge status: Nursing Home (Includes ICF) | Payer: Medicare Other | Attending: Nephrology | Admitting: Nephrology

## 2017-10-17 ENCOUNTER — Observation Stay (HOSPITAL_COMMUNITY): Payer: Medicare Other

## 2017-10-17 ENCOUNTER — Other Ambulatory Visit: Payer: Self-pay

## 2017-10-17 ENCOUNTER — Encounter (HOSPITAL_COMMUNITY): Payer: Self-pay | Admitting: Emergency Medicine

## 2017-10-17 ENCOUNTER — Emergency Department (HOSPITAL_COMMUNITY): Payer: Medicare Other

## 2017-10-17 DIAGNOSIS — I10 Essential (primary) hypertension: Secondary | ICD-10-CM | POA: Diagnosis present

## 2017-10-17 DIAGNOSIS — G4730 Sleep apnea, unspecified: Secondary | ICD-10-CM | POA: Diagnosis present

## 2017-10-17 DIAGNOSIS — Z888 Allergy status to other drugs, medicaments and biological substances status: Secondary | ICD-10-CM

## 2017-10-17 DIAGNOSIS — I471 Supraventricular tachycardia: Secondary | ICD-10-CM | POA: Diagnosis not present

## 2017-10-17 DIAGNOSIS — B351 Tinea unguium: Secondary | ICD-10-CM | POA: Diagnosis present

## 2017-10-17 DIAGNOSIS — M170 Bilateral primary osteoarthritis of knee: Secondary | ICD-10-CM | POA: Diagnosis not present

## 2017-10-17 DIAGNOSIS — E785 Hyperlipidemia, unspecified: Secondary | ICD-10-CM | POA: Diagnosis present

## 2017-10-17 DIAGNOSIS — K219 Gastro-esophageal reflux disease without esophagitis: Secondary | ICD-10-CM | POA: Diagnosis present

## 2017-10-17 DIAGNOSIS — Z66 Do not resuscitate: Secondary | ICD-10-CM | POA: Diagnosis present

## 2017-10-17 DIAGNOSIS — Z8673 Personal history of transient ischemic attack (TIA), and cerebral infarction without residual deficits: Secondary | ICD-10-CM

## 2017-10-17 DIAGNOSIS — Z8249 Family history of ischemic heart disease and other diseases of the circulatory system: Secondary | ICD-10-CM

## 2017-10-17 DIAGNOSIS — F0390 Unspecified dementia without behavioral disturbance: Secondary | ICD-10-CM | POA: Diagnosis present

## 2017-10-17 DIAGNOSIS — I4891 Unspecified atrial fibrillation: Secondary | ICD-10-CM | POA: Diagnosis present

## 2017-10-17 DIAGNOSIS — L03115 Cellulitis of right lower limb: Principal | ICD-10-CM | POA: Diagnosis present

## 2017-10-17 DIAGNOSIS — R531 Weakness: Secondary | ICD-10-CM | POA: Diagnosis not present

## 2017-10-17 DIAGNOSIS — Z8546 Personal history of malignant neoplasm of prostate: Secondary | ICD-10-CM

## 2017-10-17 DIAGNOSIS — Z7902 Long term (current) use of antithrombotics/antiplatelets: Secondary | ICD-10-CM

## 2017-10-17 DIAGNOSIS — N183 Chronic kidney disease, stage 3 (moderate): Secondary | ICD-10-CM | POA: Diagnosis present

## 2017-10-17 DIAGNOSIS — H9190 Unspecified hearing loss, unspecified ear: Secondary | ICD-10-CM | POA: Diagnosis present

## 2017-10-17 DIAGNOSIS — Z79899 Other long term (current) drug therapy: Secondary | ICD-10-CM

## 2017-10-17 DIAGNOSIS — N179 Acute kidney failure, unspecified: Secondary | ICD-10-CM | POA: Diagnosis not present

## 2017-10-17 DIAGNOSIS — R29818 Other symptoms and signs involving the nervous system: Secondary | ICD-10-CM | POA: Diagnosis not present

## 2017-10-17 DIAGNOSIS — Z794 Long term (current) use of insulin: Secondary | ICD-10-CM

## 2017-10-17 DIAGNOSIS — E1142 Type 2 diabetes mellitus with diabetic polyneuropathy: Secondary | ICD-10-CM | POA: Diagnosis present

## 2017-10-17 DIAGNOSIS — R4182 Altered mental status, unspecified: Secondary | ICD-10-CM | POA: Diagnosis not present

## 2017-10-17 DIAGNOSIS — R251 Tremor, unspecified: Secondary | ICD-10-CM | POA: Diagnosis not present

## 2017-10-17 DIAGNOSIS — D560 Alpha thalassemia: Secondary | ICD-10-CM | POA: Diagnosis present

## 2017-10-17 DIAGNOSIS — I5032 Chronic diastolic (congestive) heart failure: Secondary | ICD-10-CM | POA: Diagnosis present

## 2017-10-17 DIAGNOSIS — E1151 Type 2 diabetes mellitus with diabetic peripheral angiopathy without gangrene: Secondary | ICD-10-CM | POA: Diagnosis present

## 2017-10-17 DIAGNOSIS — I130 Hypertensive heart and chronic kidney disease with heart failure and stage 1 through stage 4 chronic kidney disease, or unspecified chronic kidney disease: Secondary | ICD-10-CM | POA: Diagnosis present

## 2017-10-17 DIAGNOSIS — Z7982 Long term (current) use of aspirin: Secondary | ICD-10-CM

## 2017-10-17 DIAGNOSIS — E873 Alkalosis: Secondary | ICD-10-CM | POA: Diagnosis present

## 2017-10-17 DIAGNOSIS — R9431 Abnormal electrocardiogram [ECG] [EKG]: Secondary | ICD-10-CM | POA: Diagnosis not present

## 2017-10-17 DIAGNOSIS — E7800 Pure hypercholesterolemia, unspecified: Secondary | ICD-10-CM | POA: Diagnosis present

## 2017-10-17 DIAGNOSIS — I878 Other specified disorders of veins: Secondary | ICD-10-CM | POA: Diagnosis present

## 2017-10-17 DIAGNOSIS — R3915 Urgency of urination: Secondary | ICD-10-CM | POA: Diagnosis present

## 2017-10-17 DIAGNOSIS — N401 Enlarged prostate with lower urinary tract symptoms: Secondary | ICD-10-CM | POA: Diagnosis present

## 2017-10-17 DIAGNOSIS — E669 Obesity, unspecified: Secondary | ICD-10-CM | POA: Diagnosis present

## 2017-10-17 DIAGNOSIS — F32A Depression, unspecified: Secondary | ICD-10-CM | POA: Diagnosis present

## 2017-10-17 DIAGNOSIS — Z6838 Body mass index (BMI) 38.0-38.9, adult: Secondary | ICD-10-CM

## 2017-10-17 DIAGNOSIS — L03116 Cellulitis of left lower limb: Secondary | ICD-10-CM | POA: Diagnosis not present

## 2017-10-17 DIAGNOSIS — J9611 Chronic respiratory failure with hypoxia: Secondary | ICD-10-CM | POA: Diagnosis present

## 2017-10-17 DIAGNOSIS — R600 Localized edema: Secondary | ICD-10-CM | POA: Diagnosis not present

## 2017-10-17 DIAGNOSIS — I5030 Unspecified diastolic (congestive) heart failure: Secondary | ICD-10-CM | POA: Diagnosis not present

## 2017-10-17 DIAGNOSIS — E1122 Type 2 diabetes mellitus with diabetic chronic kidney disease: Secondary | ICD-10-CM | POA: Diagnosis not present

## 2017-10-17 DIAGNOSIS — Z9981 Dependence on supplemental oxygen: Secondary | ICD-10-CM

## 2017-10-17 DIAGNOSIS — F329 Major depressive disorder, single episode, unspecified: Secondary | ICD-10-CM | POA: Diagnosis present

## 2017-10-17 DIAGNOSIS — M79609 Pain in unspecified limb: Secondary | ICD-10-CM | POA: Diagnosis not present

## 2017-10-17 DIAGNOSIS — L0390 Cellulitis, unspecified: Secondary | ICD-10-CM | POA: Diagnosis present

## 2017-10-17 DIAGNOSIS — Z8679 Personal history of other diseases of the circulatory system: Secondary | ICD-10-CM

## 2017-10-17 LAB — URINALYSIS, ROUTINE W REFLEX MICROSCOPIC
Bilirubin Urine: NEGATIVE
Glucose, UA: NEGATIVE mg/dL
HGB URINE DIPSTICK: NEGATIVE
Ketones, ur: NEGATIVE mg/dL
Leukocytes, UA: NEGATIVE
NITRITE: NEGATIVE
Protein, ur: NEGATIVE mg/dL
SPECIFIC GRAVITY, URINE: 1.013 (ref 1.005–1.030)
pH: 6 (ref 5.0–8.0)

## 2017-10-17 LAB — CBC WITH DIFFERENTIAL/PLATELET
BASOS ABS: 0 10*3/uL (ref 0.0–0.1)
Basophils Relative: 0 %
EOS ABS: 0.3 10*3/uL (ref 0.0–0.7)
Eosinophils Relative: 4 %
HCT: 37.6 % — ABNORMAL LOW (ref 39.0–52.0)
Hemoglobin: 11.4 g/dL — ABNORMAL LOW (ref 13.0–17.0)
Lymphocytes Relative: 23 %
Lymphs Abs: 1.9 10*3/uL (ref 0.7–4.0)
MCH: 21.6 pg — AB (ref 26.0–34.0)
MCHC: 30.3 g/dL (ref 30.0–36.0)
MCV: 71.1 fL — ABNORMAL LOW (ref 78.0–100.0)
MONO ABS: 0.7 10*3/uL (ref 0.1–1.0)
Monocytes Relative: 9 %
NEUTROS PCT: 64 %
Neutro Abs: 5.3 10*3/uL (ref 1.7–7.7)
PLATELETS: 204 10*3/uL (ref 150–400)
RBC: 5.29 MIL/uL (ref 4.22–5.81)
RDW: 15.1 % (ref 11.5–15.5)
WBC: 8.2 10*3/uL (ref 4.0–10.5)

## 2017-10-17 LAB — COMPREHENSIVE METABOLIC PANEL
ALBUMIN: 3.5 g/dL (ref 3.5–5.0)
ALT: 26 U/L (ref 17–63)
AST: 27 U/L (ref 15–41)
Alkaline Phosphatase: 85 U/L (ref 38–126)
Anion gap: 5 (ref 5–15)
BUN: 16 mg/dL (ref 6–20)
CHLORIDE: 101 mmol/L (ref 101–111)
CO2: 36 mmol/L — AB (ref 22–32)
CREATININE: 1.65 mg/dL — AB (ref 0.61–1.24)
Calcium: 9.1 mg/dL (ref 8.9–10.3)
GFR calc Af Amer: 42 mL/min — ABNORMAL LOW (ref >60)
GFR calc non Af Amer: 37 mL/min — ABNORMAL LOW (ref >60)
Glucose, Bld: 138 mg/dL — ABNORMAL HIGH (ref 65–99)
Potassium: 3.7 mmol/L (ref 3.5–5.1)
SODIUM: 142 mmol/L (ref 135–145)
Total Bilirubin: 0.9 mg/dL (ref 0.3–1.2)
Total Protein: 6.9 g/dL (ref 6.5–8.1)

## 2017-10-17 LAB — GLUCOSE, CAPILLARY: Glucose-Capillary: 102 mg/dL — ABNORMAL HIGH (ref 65–99)

## 2017-10-17 LAB — BLOOD GAS, VENOUS
Acid-Base Excess: 8.9 mmol/L — ABNORMAL HIGH (ref 0.0–2.0)
Bicarbonate: 35.9 mmol/L — ABNORMAL HIGH (ref 20.0–28.0)
O2 Saturation: 52.2 %
PH VEN: 7.355 (ref 7.250–7.430)
Patient temperature: 37
pCO2, Ven: 65.9 mmHg — ABNORMAL HIGH (ref 44.0–60.0)
pO2, Ven: 32 mmHg (ref 32.0–45.0)

## 2017-10-17 LAB — TSH: TSH: 3.284 u[IU]/mL (ref 0.350–4.500)

## 2017-10-17 LAB — I-STAT TROPONIN, ED: TROPONIN I, POC: 0 ng/mL (ref 0.00–0.08)

## 2017-10-17 LAB — I-STAT CG4 LACTIC ACID, ED: Lactic Acid, Venous: 0.92 mmol/L (ref 0.5–1.9)

## 2017-10-17 LAB — MRSA PCR SCREENING: MRSA BY PCR: POSITIVE — AB

## 2017-10-17 MED ORDER — VANCOMYCIN HCL IN DEXTROSE 1-5 GM/200ML-% IV SOLN
1000.0000 mg | Freq: Once | INTRAVENOUS | Status: AC
  Administered 2017-10-17: 1000 mg via INTRAVENOUS
  Filled 2017-10-17: qty 200

## 2017-10-17 MED ORDER — INSULIN GLARGINE 100 UNIT/ML ~~LOC~~ SOLN
40.0000 [IU] | Freq: Three times a day (TID) | SUBCUTANEOUS | Status: DC
  Administered 2017-10-18: 40 [IU] via SUBCUTANEOUS
  Filled 2017-10-17 (×2): qty 0.4

## 2017-10-17 MED ORDER — CLOPIDOGREL BISULFATE 75 MG PO TABS
75.0000 mg | ORAL_TABLET | Freq: Every day | ORAL | Status: DC
  Administered 2017-10-18 – 2017-10-20 (×3): 75 mg via ORAL
  Filled 2017-10-17 (×3): qty 1

## 2017-10-17 MED ORDER — SIMVASTATIN 20 MG PO TABS
20.0000 mg | ORAL_TABLET | Freq: Every day | ORAL | Status: DC
  Administered 2017-10-17 – 2017-10-20 (×4): 20 mg via ORAL
  Filled 2017-10-17 (×4): qty 1

## 2017-10-17 MED ORDER — METOPROLOL TARTRATE 25 MG PO TABS
25.0000 mg | ORAL_TABLET | Freq: Two times a day (BID) | ORAL | Status: DC
  Administered 2017-10-17 – 2017-10-20 (×6): 25 mg via ORAL
  Filled 2017-10-17 (×6): qty 1

## 2017-10-17 MED ORDER — SODIUM CHLORIDE 0.9 % IV SOLN
INTRAVENOUS | Status: DC
  Administered 2017-10-17: 22:00:00 via INTRAVENOUS

## 2017-10-17 MED ORDER — INSULIN ASPART 100 UNIT/ML ~~LOC~~ SOLN: 0.0000 [IU] | Freq: Every day | SUBCUTANEOUS | Status: DC

## 2017-10-17 MED ORDER — INSULIN ASPART 100 UNIT/ML ~~LOC~~ SOLN
0.0000 [IU] | Freq: Three times a day (TID) | SUBCUTANEOUS | Status: DC
  Administered 2017-10-18 (×3): 3 [IU] via SUBCUTANEOUS
  Administered 2017-10-19: 5 [IU] via SUBCUTANEOUS
  Administered 2017-10-19: 8 [IU] via SUBCUTANEOUS
  Administered 2017-10-19: 5 [IU] via SUBCUTANEOUS
  Administered 2017-10-20: 3 [IU] via SUBCUTANEOUS

## 2017-10-17 MED ORDER — FLUTICASONE PROPIONATE 50 MCG/ACT NA SUSP
1.0000 | Freq: Every day | NASAL | Status: DC
  Administered 2017-10-18 – 2017-10-20 (×3): 1 via NASAL
  Filled 2017-10-17: qty 16

## 2017-10-17 MED ORDER — ASPIRIN EC 81 MG PO TBEC
81.0000 mg | DELAYED_RELEASE_TABLET | Freq: Every day | ORAL | Status: DC
  Administered 2017-10-18 – 2017-10-20 (×3): 81 mg via ORAL
  Filled 2017-10-17 (×3): qty 1

## 2017-10-17 MED ORDER — TERAZOSIN HCL 2 MG PO CAPS
4.0000 mg | ORAL_CAPSULE | Freq: Every day | ORAL | Status: DC
  Administered 2017-10-18 – 2017-10-20 (×3): 4 mg via ORAL
  Filled 2017-10-17 (×3): qty 2

## 2017-10-17 MED ORDER — GABAPENTIN 300 MG PO CAPS
300.0000 mg | ORAL_CAPSULE | Freq: Four times a day (QID) | ORAL | Status: DC
  Administered 2017-10-17 – 2017-10-18 (×2): 300 mg via ORAL
  Filled 2017-10-17 (×2): qty 1

## 2017-10-17 MED ORDER — SENNOSIDES-DOCUSATE SODIUM 8.6-50 MG PO TABS
1.0000 | ORAL_TABLET | Freq: Two times a day (BID) | ORAL | Status: DC
  Administered 2017-10-17 – 2017-10-20 (×6): 1 via ORAL
  Filled 2017-10-17 (×6): qty 1

## 2017-10-17 MED ORDER — PANTOPRAZOLE SODIUM 40 MG PO TBEC
40.0000 mg | DELAYED_RELEASE_TABLET | Freq: Every day | ORAL | Status: DC
  Administered 2017-10-18 – 2017-10-20 (×3): 40 mg via ORAL
  Filled 2017-10-17 (×3): qty 1

## 2017-10-17 MED ORDER — VITAMIN B-12 1000 MCG PO TABS
1000.0000 ug | ORAL_TABLET | Freq: Every day | ORAL | Status: DC
  Administered 2017-10-18 – 2017-10-20 (×3): 1000 ug via ORAL
  Filled 2017-10-17 (×3): qty 1

## 2017-10-17 MED ORDER — POTASSIUM CHLORIDE 20 MEQ/15ML (10%) PO SOLN
20.0000 meq | Freq: Every day | ORAL | Status: DC
  Administered 2017-10-18 – 2017-10-20 (×3): 20 meq via ORAL
  Filled 2017-10-17 (×6): qty 15

## 2017-10-17 MED ORDER — HEPARIN SODIUM (PORCINE) 5000 UNIT/ML IJ SOLN
5000.0000 [IU] | Freq: Three times a day (TID) | INTRAMUSCULAR | Status: DC
  Administered 2017-10-17 – 2017-10-20 (×8): 5000 [IU] via SUBCUTANEOUS
  Filled 2017-10-17 (×8): qty 1

## 2017-10-17 MED ORDER — B COMPLEX-C PO TABS
1.0000 | ORAL_TABLET | Freq: Every day | ORAL | Status: DC
  Administered 2017-10-18 – 2017-10-19 (×2): 1 via ORAL
  Filled 2017-10-17 (×4): qty 1

## 2017-10-17 MED ORDER — AMITRIPTYLINE HCL 25 MG PO TABS
50.0000 mg | ORAL_TABLET | Freq: Every day | ORAL | Status: DC
  Administered 2017-10-17: 50 mg via ORAL
  Filled 2017-10-17: qty 2

## 2017-10-17 MED ORDER — CEFAZOLIN SODIUM-DEXTROSE 2-4 GM/100ML-% IV SOLN
2.0000 g | Freq: Three times a day (TID) | INTRAVENOUS | Status: DC
  Administered 2017-10-17 – 2017-10-18 (×2): 2 g via INTRAVENOUS
  Filled 2017-10-17 (×3): qty 100

## 2017-10-17 NOTE — ED Notes (Signed)
ED TO INPATIENT HANDOFF REPORT  Name/Age/Gender Jared Hall 83 y.o. male  Code Status Code Status History    Date Active Date Inactive Code Status Order ID Comments User Context   01/02/2017 14:31 01/10/2017 00:10 DNR [id number]  Germain Osgood, PA-C Inpatient   01/01/2017 20:54 01/02/2017 14:31 Full Code [id number]  Eugenie Filler, MD Inpatient   12/05/2016 22:29 12/15/2016 16:33 Full Code [id number]  Norval Morton, MD ED   03/14/2015 23:49 03/17/2015 19:46 Full Code [id number]  Jani Gravel, MD Inpatient   01/11/2014 14:49 01/13/2014 16:19 DNR [id number]  Annita Brod, MD Inpatient   04/04/2013 17:21 04/08/2013 18:46 Full Code [id number]  Orson Eva, MD Inpatient   12/18/2011 13:41 12/22/2011 14:29 Full Code [id number]  Courtney Heys, RN Inpatient   12/14/2011 01:41 12/18/2011 13:41 Full Code [id number]  Malva Limes, RN Inpatient    Questions for Most Recent Historical Code Status (Order [id number])    Question Answer Comment   In the event of cardiac or respiratory ARREST Do not call a "code blue"    In the event of cardiac or respiratory ARREST Do not perform Intubation, CPR, defibrillation or ACLS    In the event of cardiac or respiratory ARREST Use medication by any route, position, wound care, and other measures to relive pain and suffering. May use oxygen, suction and manual treatment of airway obstruction as needed for comfort.         Advance Directive Documentation     Most Recent Value  Type of Advance Directive  Out of facility DNR (pink MOST or yellow form)  Pre-existing out of facility DNR order (yellow form or pink MOST form)  Yellow form placed in chart (order not valid for inpatient use)  "MOST" Form in Place?  No data      Home/SNF/Other [facility]  Chief Complaint leg pain   Level of Care/Admitting Diagnosis ED Disposition    ED Disposition Condition Comment   Admit  Hospital Area: [facility] [[id number]]  Level of Care: Telemetry [5]  Admit to  tele based on following criteria: Other see comments  Comments: monitor HR with cellulitis  Diagnosis: Cellulitis [962229]  Admitting Physician: Elodia Florence [phone]  Attending Physician: Cephus Slater, A CALDWELL [phone]  PT Class (Do Not Modify): Observation [104]  PT Acc Code (Do Not Modify): Observation [10022]       Medical History Past Medical History:  Diagnosis Date  . Alpha thalassemia ([place])   . Anemia 01/01/2017  . BPH (benign prostatic hyperplasia)   . CHF (congestive heart failure) ([place])   . High cholesterol   . Hypertension   . Lower extremity edema   . Onychomycosis   . Prostate cancer ([place])    S/P "8 weeks of radiation"  . Prostatitis    recurrent  . Sleep apnea   . Stroke [facility]) ~ 2005   denies residual on 2/123/2015  . Type II diabetes mellitus ([place])   . Umbilical hernia   . Urinary urgency    with incontinence    Allergies Allergies  Allergen Reactions  . Ativan [Lorazepam] Other (See Comments)    Flailing of extremities noted immediately s/p IV administration.  [name] Kitchen Flomax [Tamsulosin Hcl] Other (See Comments)    Excessive tearing  . Glucophage [Metformin Hcl] Diarrhea    IV Location/Drains/Wounds Patient Lines/Drains/Airways Status   Active Line/Drains/Airways    Name:   Placement date:   Placement time:   Site:  Days:   Peripheral IV 10/17/17 Left Antecubital   10/17/17    1431    Antecubital   less than 1   Incision 12/18/11 Abdomen Other (Comment)   12/18/11    1037     2130   Pressure Ulcer 12/14/11 Stage II -  Partial thickness loss of dermis presenting as a shallow open ulcer with a red, pink wound bed without slough.   12/14/11    0145     2134   Wound / Incision (Open or Dehisced) 12/06/16 Other (Comment) Leg Left;Right Partial thickness loss   12/06/16    0030    Leg   315   Wound / Incision (Open or Dehisced) 01/11/14 Other (Comment) Leg Bilateral;Circumferential;Lower edematous red ulcerations with weeping   01/11/14    2004     Leg   1375   Wound / Incision (Open or Dehisced) 01/12/14 Other (Comment) Buttocks Right;Left;Mid two small open slit on billateral side buttucks  interior cheeks    01/12/14    0333    Buttocks   1374          Labs/Imaging Results for orders placed or performed during the hospital encounter of 10/17/17 (from the past 48 hour(s))  CBC with Differential     Status: Abnormal   Collection Time: 10/17/17  2:30 PM  Result Value Ref Range   WBC 8.2 4.0 - 10.5 K/uL   RBC 5.29 4.22 - 5.81 MIL/uL   Hemoglobin 11.4 (L) 13.0 - 17.0 g/dL   HCT 37.6 (L) 39.0 - 52.0 %   MCV 71.1 (L) 78.0 - 100.0 fL   MCH 21.6 (L) 26.0 - 34.0 pg   MCHC 30.3 30.0 - 36.0 g/dL   RDW 15.1 11.5 - 15.5 %   Platelets 204 150 - 400 K/uL   Neutrophils Relative % 64 %   Lymphocytes Relative 23 %   Monocytes Relative 9 %   Eosinophils Relative 4 %   Basophils Relative 0 %   Neutro Abs 5.3 1.7 - 7.7 K/uL   Lymphs Abs 1.9 0.7 - 4.0 K/uL   Monocytes Absolute 0.7 0.1 - 1.0 K/uL   Eosinophils Absolute 0.3 0.0 - 0.7 K/uL   Basophils Absolute 0.0 0.0 - 0.1 K/uL   RBC Morphology POLYCHROMASIA PRESENT     Comment: ELLIPTOCYTES  Comprehensive metabolic panel     Status: Abnormal   Collection Time: 10/17/17  2:30 PM  Result Value Ref Range   Sodium 142 135 - 145 mmol/L   Potassium 3.7 3.5 - 5.1 mmol/L   Chloride 101 101 - 111 mmol/L   CO2 36 (H) 22 - 32 mmol/L   Glucose, Bld 138 (H) 65 - 99 mg/dL   BUN 16 6 - 20 mg/dL   Creatinine, Ser 1.65 (H) 0.61 - 1.24 mg/dL   Calcium 9.1 8.9 - 10.3 mg/dL   Total Protein 6.9 6.5 - 8.1 g/dL   Albumin 3.5 3.5 - 5.0 g/dL   AST 27 15 - 41 U/L   ALT 26 17 - 63 U/L   Alkaline Phosphatase 85 38 - 126 U/L   Total Bilirubin 0.9 0.3 - 1.2 mg/dL   GFR calc non Af Amer 37 (L) >60 mL/min   GFR calc Af Amer 42 (L) >60 mL/min    Comment: (NOTE) The eGFR has been calculated using the CKD EPI equation. This calculation has not been validated in all clinical situations. eGFR's persistently <60  mL/min signify possible Chronic Kidney Disease.  Anion gap 5 5 - 15  I-Stat CG4 Lactic Acid, ED     Status: None   Collection Time: 10/17/17  2:39 PM  Result Value Ref Range   Lactic Acid, Venous 0.92 0.5 - 1.9 mmol/L  Urinalysis, Routine w reflex microscopic     Status: None   Collection Time: 10/17/17  4:30 PM  Result Value Ref Range   Color, Urine YELLOW YELLOW   APPearance CLEAR CLEAR   Specific Gravity, Urine 1.013 1.005 - 1.030   pH 6.0 5.0 - 8.0   Glucose, UA NEGATIVE NEGATIVE mg/dL   Hgb urine dipstick NEGATIVE NEGATIVE   Bilirubin Urine NEGATIVE NEGATIVE   Ketones, ur NEGATIVE NEGATIVE mg/dL   Protein, ur NEGATIVE NEGATIVE mg/dL   Nitrite NEGATIVE NEGATIVE   Leukocytes, UA NEGATIVE NEGATIVE  I-Stat Troponin, ED (not at [facility])     Status: None   Collection Time: 10/17/17  6:39 PM  Result Value Ref Range   Troponin i, poc 0.00 0.00 - 0.08 ng/mL   Comment 3            Comment: Due to the release kinetics of cTnI, a negative result within the first hours of the onset of symptoms does not rule out myocardial infarction with certainty. If myocardial infarction is still suspected, repeat the test at appropriate intervals.    Dg Chest 2 View  Result Date: 10/17/2017 CLINICAL DATA:  Weakness, leg pain and swelling EXAM: CHEST  2 VIEW COMPARISON:  01/01/2017 FINDINGS: Stable cardiomegaly with bibasilar atelectasis and left midlung scarring. Chronic elevation of the left hemidiaphragm. Upper lobes remain clear. No enlarging effusion pneumothorax. Negative for edema or CHF. Trachea is midline. IMPRESSION: Stable cardiomegaly with bibasilar and left midlung atelectasis/scarring. Atherosclerosis No significant interval change. Electronically Signed   By: Jerilynn Mages.  Shick M.D.   On: 10/17/2017 17:24    Pending Labs Unresulted Labs (From admission, onward)   Start     Ordered   10/17/17 1532  Blood culture (routine x 2)  BLOOD CULTURE X 2,   STAT     10/17/17 1531   Signed and Held   CBC  (heparin)  Once,   R    Comments:  Baseline for heparin therapy IF NOT ALREADY DRAWN.  Notify MD if PLT < 100 K.    Signed and Held   Signed and Held  Creatinine, serum  (heparin)  Once,   R    Comments:  Baseline for heparin therapy IF NOT ALREADY DRAWN.    Signed and Held   Signed and Held  TSH  Add-on,   R     Signed and Held   Signed and Held  Comprehensive metabolic panel  Tomorrow morning,   R     Signed and Held   Signed and Held  CBC  Tomorrow morning,   R     Signed and Held      Vitals/Pain Today's Vitals   10/17/17 1128 10/17/17 1346 10/17/17 1609 10/17/17 1852  BP: (!) 140/94  (!) 161/75 (!) 155/70  Pulse: 82  73 75  Resp: '16  16 16  '$ Temp: (!) 97.4 F (36.3 C) (!) 96.7 F (35.9 C)    TempSrc: Oral Rectal    SpO2: 100%  100% 100%    Isolation Precautions No active isolations  Medications Medications  vancomycin (VANCOCIN) IVPB 1000 mg/200 mL premix (0 mg Intravenous Stopped 10/17/17 1933)    Mobility non-ambulatory

## 2017-10-17 NOTE — ED Notes (Signed)
Assigned Room 1422@1938  call consult @2002 

## 2017-10-17 NOTE — ED Provider Notes (Signed)
[facility] DEPT Provider Note   CSN: [id number] Arrival date & time: 10/17/17  1113     History   Chief Complaint Chief Complaint  Patient presents with  . Leg Pain  . Shaking    HPI Jared Hall is a 83 y.o. male.  HPI  Jared Hall is a 83 y.o. male with multiple medical problems, presents to emergency department from assisted living facility with complaint of bilateral leg pain and shaking.  Patient is a poor historian, states that his legs hurt and they shake, he is able to tell me that it started this morning.  I spoke with the nurse at the [facility], who explained to me that patient has been complaining about the pain in his legs more today than usual.  She also stated that he did not eat breakfast which is unusual for him.  She states that he has developed shaking in his arms and legs, which she has never seen before.  When physical therapy came to work with the patient, he was unable to stand up, patient usually able to stand up and transfer and even take a few steps with physical therapy on his own.  Patient does have history of chronic swelling to bilateral lower legs with recurrent cellulitis.  He also has peripheral arterial disease.  Past Medical History:  Diagnosis Date  . Alpha thalassemia ([place])   . Anemia 01/01/2017  . BPH (benign prostatic hyperplasia)   . CHF (congestive heart failure) ([place])   . High cholesterol   . Hypertension   . Lower extremity edema   . Onychomycosis   . Prostate cancer ([place])    S/P "8 weeks of radiation"  . Prostatitis    recurrent  . Sleep apnea   . Stroke [facility]) ~ 2005   denies residual on 2/123/2015  . Type II diabetes mellitus ([place])   . Umbilical hernia   . Urinary urgency    with incontinence    Patient Active Problem List   Diagnosis Date Noted  . Acute encephalopathy   . Acute ischemic stroke ([place])   . Generalized weakness   . Cerebrovascular disease   . Altered mental  status   . Acute respiratory failure with hypercapnia ([place])   . CVA (cerebral vascular accident) ([place]) 01/01/2017  . Dehydration 01/01/2017  . Anemia 01/01/2017  . Acute respiratory failure ([place]) 12/15/2016  . Morbid obesity ([place]) 12/15/2016  . Hard of hearing 12/15/2016  . Chronic kidney disease, stage III (moderate) ([place]) 12/06/2016  . Bell's palsy   . Facial droop   . Paroxysmal atrial fibrillation (HCC)   . Embolic stroke ([place])   . Dysphagia 03/14/2015  . Dysarthria 03/14/2015  . Stroke ([place]) 03/14/2015  . Cellulitis 01/11/2014  . Bilateral lower extremity edema 01/11/2014  . Venous stasis dermatitis 01/11/2014  . DM type 2 with diabetic peripheral neuropathy ([place]) 01/11/2014  . Cellulitis and abscess of leg 01/11/2014  . Cellulitis and abscess 01/11/2014  . Depression 06/15/2013  . GERD (gastroesophageal reflux disease) 06/15/2013  . Constipation 06/15/2013  . Diabetic neuropathy ([place]) 06/15/2013  . Type 1 diabetes mellitus with peripheral circulatory complications ([place]) 74/25/9563  . Essential hypertension, benign 04/07/2013  . AKI (acute kidney injury) ([place]) 04/04/2013  . Cellulitis of lower extremity 04/04/2013  . Right hip pain 04/04/2013  . Renal insufficiency 12/16/2011  . History of CVA (cerebrovascular accident) 12/16/2011  . Dyslipidemia 12/16/2011  . Obesity (BMI 30-39.9) 12/16/2011  . Umbilical  hernia with obstruction-partial on CT scan; easily reducible 12/14/2011  . History of PSVT (paroxysmal supraventricular tachycardia) 12/14/2011  . DM (diabetes mellitus) ([place]) 12/14/2011    Past Surgical History:  Procedure Laterality Date  . ADENOIDECTOMY    . HERNIA REPAIR    . MASTOIDECTOMY    . TONSILLECTOMY    . VASECTOMY    . VENTRAL HERNIA REPAIR  12/18/2011   Procedure: HERNIA REPAIR VENTRAL ADULT;  Surgeon: Adin Hector, MD;  Location: [facility];  Service: General;  Laterality: N/A;       Home Medications    Prior to Admission medications     Medication Sig Start Date End Date Taking? Authorizing Provider  amitriptyline (ELAVIL) 25 MG tablet Take 25 mg by mouth at bedtime.    [provider]  aspirin EC 81 MG tablet Take 81 mg by mouth daily.    [provider]  B Complex Vitamins (VITAMIN B COMPLEX PO) Take 1 tablet by mouth daily.    [provider]  cephALEXin (KEFLEX) 500 MG capsule Take 500 mg by mouth 2 (two) times daily. For 10 days, starting5/8/18 @@ 2000.    [provider]  clopidogrel (PLAVIX) 75 MG tablet Take 75 mg by mouth daily.     [provider]  doxycycline (VIBRAMYCIN) 100 MG capsule Take 1 capsule (100 mg total) by mouth 2 (two) times daily. One po bid x 7 days 03/28/17   Daleen Bo, MD  fluticasone [facility]) 50 MCG/ACT nasal spray Place 1 spray into both nostrils daily. 03/17/15   Kelvin Cellar, MD  furosemide (LASIX) 40 MG tablet Take 40 mg by mouth at bedtime.    [provider]  furosemide (LASIX) 80 MG tablet Take 80 mg by mouth daily.    [provider]  gabapentin (NEURONTIN) 100 MG capsule Take 1 capsule (100 mg total) by mouth 3 (three) times daily. Patient not taking: Reported on 02/13/2017 01/09/17   Regalado, Jerald Kief A, MD  gabapentin (NEURONTIN) 300 MG capsule Take 300 mg by mouth 3 (three) times daily. Hold for sedation.    [provider]  hydrocerin (EUCERIN) CREA Apply 1 application topically daily. Patient taking differently: Apply 1 application topically 2 (two) times daily as needed.  01/09/17   Regalado, Belkys A, MD  insulin aspart (NOVOLOG) 100 UNIT/ML injection Inject 4-15 Units into the skin 2 (two) times daily. Blood sugar < 150 = 0 units 151-200 = 4 units 201-250 = 6 units 251-300 = 8 units 301-350 = 10 units 351-400 = 12 units >400 = 15 units    [provider]  insulin glargine (LANTUS) 100 UNIT/ML injection Inject 50 Units into the skin 2 (two) times daily.     [provider]  metoprolol  tartrate (LOPRESSOR) 25 MG tablet Take 1 tablet (25 mg total) by mouth 2 (two) times daily. 03/17/15   Kelvin Cellar, MD  pantoprazole (PROTONIX) 40 MG tablet Take 40 mg by mouth daily.     [provider]  potassium chloride (KLOR-CON) 20 MEQ packet Take 20 mEq by mouth 2 (two) times daily.    [provider]  senna-docusate (SENOKOT-S) 8.6-50 MG tablet Take 1 tablet by mouth 2 (two) times daily.    [provider]  simvastatin (ZOCOR) 20 MG tablet Take 20 mg by mouth daily.    [provider]  terazosin (HYTRIN) 2 MG capsule Take 4 mg by mouth daily. Take two 2-mg tablets to = 4 mg  [provider]  vitamin B-12 (CYANOCOBALAMIN) 1000 MCG tablet Take 1,000 mcg by mouth daily.    [provider]    Family History Family History  Problem Relation Age of Onset  . Pneumonia Mother   . Heart attack Father     Social History Social History   Tobacco Use  . Smoking status: Never Smoker  . Smokeless tobacco: Never Used  Substance Use Topics  . Alcohol use: No  . Drug use: No     Allergies   Ativan [lorazepam]; Flomax [tamsulosin hcl]; and Glucophage [metformin hcl]   Review of Systems Review of Systems  Constitutional: Negative for chills and fever.  Respiratory: Negative for cough, chest tightness and shortness of breath.   Cardiovascular: Negative for chest pain, palpitations and leg swelling.  Gastrointestinal: Negative for abdominal distention, abdominal pain, diarrhea, nausea and vomiting.  Genitourinary: Negative for dysuria.  Musculoskeletal: Positive for arthralgias and myalgias. Negative for neck pain and neck stiffness.  Skin: Positive for color change. Negative for rash.  Allergic/Immunologic: Negative for immunocompromised state.  Neurological: Positive for tremors. Negative for dizziness, weakness, light-headedness, numbness and headaches.  All other systems reviewed and are negative.    Physical  Exam Updated Vital Signs BP (!) 140/94 (BP Location: Left Arm)   Pulse 82   Temp (!) 97.4 F (36.3 C) (Oral)   Resp 16   SpO2 100%   Physical Exam  Constitutional: He appears well-developed and well-nourished. No distress.  HENT:  Head: Normocephalic and atraumatic.  Eyes: Conjunctivae are normal.  Neck: Neck supple.  Cardiovascular: Normal rate, regular rhythm and normal heart sounds.  Pulmonary/Chest: Effort normal. No respiratory distress. He has no wheezes. He has no rales.  Abdominal: Soft. Bowel sounds are normal. He exhibits no distension. There is no tenderness. There is no rebound.  Musculoskeletal: He exhibits no edema.  Bilateral lower extremity swelling with erythema extending from the foot up to just distal to the knee.  Fungal infection to all toes with no wounds to the feet or toes.  Pitting edema 2+.  Warm to the touch.  Neurological: He is alert.  Skin: Skin is warm and dry.  Nursing note and vitals reviewed.    ED Treatments / Results  Labs (all labs ordered are listed, but only abnormal results are displayed) Labs Reviewed  MRSA PCR SCREENING - Abnormal; Notable for the following components:      Result Value   MRSA by PCR POSITIVE (*)    All other components within normal limits  CBC WITH DIFFERENTIAL/PLATELET - Abnormal; Notable for the following components:   Hemoglobin 11.4 (*)    HCT 37.6 (*)    MCV 71.1 (*)    MCH 21.6 (*)    All other components within normal limits  COMPREHENSIVE METABOLIC PANEL - Abnormal; Notable for the following components:   CO2 36 (*)    Glucose, Bld 138 (*)    Creatinine, Ser 1.65 (*)    GFR calc non Af Amer 37 (*)    GFR calc Af Amer 42 (*)    All other components within normal limits  BLOOD GAS, VENOUS - Abnormal; Notable for the following components:   pCO2, Ven 65.9 (*)    Bicarbonate 35.9 (*)    Acid-Base Excess 8.9 (*)    All other components within normal limits  GLUCOSE, CAPILLARY - Abnormal; Notable for  the following components:   Glucose-Capillary 102 (*)    All other components within normal limits  CULTURE, BLOOD (ROUTINE X 2)  CULTURE, BLOOD (ROUTINE X 2)  URINALYSIS, ROUTINE W REFLEX MICROSCOPIC  TSH  COMPREHENSIVE METABOLIC PANEL  CBC  I-STAT CG4 LACTIC ACID, ED  I-STAT TROPONIN, ED    EKG  EKG Interpretation  Date/Time:  Wednesday October 17 2017 13:11:43 EST Ventricular Rate:  81 PR Interval:    QRS Duration: 93 QT Interval:  293 QTC Calculation: 336 R Axis:   32 Text Interpretation:  Sinus rhythm Prolonged PR interval Probable inferior infarct, old Baseline wander Abnormal ekg Confirmed by Carmin Muskrat [phone]) on 10/17/2017 2:06:38 PM       Radiology Dg Chest 2 View  Result Date: 10/17/2017 CLINICAL DATA:  Weakness, leg pain and swelling EXAM: CHEST  2 VIEW COMPARISON:  01/01/2017 FINDINGS: Stable cardiomegaly with bibasilar atelectasis and left midlung scarring. Chronic elevation of the left hemidiaphragm. Upper lobes remain clear. No enlarging effusion pneumothorax. Negative for edema or CHF. Trachea is midline. IMPRESSION: Stable cardiomegaly with bibasilar and left midlung atelectasis/scarring. Atherosclerosis No significant interval change. Electronically Signed   By: Jerilynn Mages.  Shick M.D.   On: 10/17/2017 17:24   Ct Head Wo Contrast  Result Date: 10/17/2017 CLINICAL DATA:  pt from [facility] for shaking and bilat leg pain with swelling and possible cellulitis. EXAM: CT HEAD WITHOUT CONTRAST TECHNIQUE: Contiguous axial images were obtained from the base of the skull through the vertex without intravenous contrast. COMPARISON:  02/13/2017 FINDINGS: Brain: There is central and cortical atrophy. Periventricular white matter changes are consistent with small vessel disease. Remote cortical infarct of the right cerebellum. There is no intra or extra-axial fluid collection or mass lesion. The basilar cisterns and ventricles have a normal appearance. There is no CT  evidence for acute infarction or hemorrhage. Vascular: There is dense atherosclerotic calcification of the internal carotid arteries. Skull: Normal. Negative for fracture or focal lesion. Sinuses/Orbits: Mild mucosal thickening of the paranasal sinuses. Mastoid air cells are normally aerated. Other: None. IMPRESSION: 1. Atrophy and small vessel disease. Remote right cerebellar infarct. 2.  No evidence for acute  abnormality. 3. Mild sinus disease. Electronically Signed   By: Nolon Nations M.D.   On: 10/17/2017 20:19    Procedures Procedures (including critical care time)  Medications Ordered in ED Medications - No data to display   Initial Impression / Assessment and Plan / ED Course  I have reviewed the triage vital signs and the nursing notes.  Pertinent labs & imaging results that were available during my care of the patient were reviewed by me and considered in my medical decision making (see chart for details).     1:29 PM Pt seen and examined. Pt with bilateral peripheral leg swelling, erythema, pain. Also tremor in both arms and legs. No other complaints. Will check rectal temperature. Get labs, UA. Will monitor.   No significant lab abnormalities.  Patient is hypothermic rectally.  We will get blood cultures and start antibiotics.  Discussed with Dr. Vanita Panda, agrees.  We will start of vancomycin.  Will admit for recurrent cellulitis.  Spoke with [facility], they will admit  Vitals:   10/17/17 1346 10/17/17 1609 10/17/17 1852 10/17/17 2100  BP:  (!) 161/75 (!) 155/70 134/87  Pulse:  73 75 87  Resp:  16 16 20   Temp: (!) 96.7 F (35.9 C)   97.9 F (36.6 C)  TempSrc: Rectal   Oral  SpO2:  100% 100% 99%  Weight:    122.8 kg (270 lb 11.6 oz)  Height:  5\' 10"  (1.778 m)     Final Clinical Impressions(s) / ED Diagnoses   Final diagnoses:  Bilateral lower leg cellulitis    ED Discharge Orders    None       Jeannett Senior, PA-C 10/17/17 2322    Carmin Muskrat, MD 10/18/17 [phone]

## 2017-10-17 NOTE — H&P (Addendum)
History and Physical    [name] HAL:[id number] DOB: [birth year] DOA: 10/17/2017  PCP: Jared Huddle, MD  Patient coming from: [place]  I have personally briefly reviewed patient's old medical records in [place]  Chief Complaint: LE redness and tremors  HPI: Jared Hall is Jared Hall 83 y.o. male with medical history significant of diastolic heart failure, stroke, type 2 diabetes, chronic respiratory failure on 2 L O2, peripheral neuropathy, multiple multiple other medical problems who [facility] with shakes and lower extremity swelling and redness today.    History is Jared Hall bit limited as patient is Jared Hall poor historian and extremely hard of hearing.  He notes that he is here because of cellulitis.  He started having shakes to his legs this morning.  This is the first time he is ever had shakes like this.  It started this morning when he woke up.  He denies any fevers, notes occasional chills.  He notes that this is may be the third time that he has had cellulitis.  He has noticed worsening swelling for the past week.  He has noticed weakness in his ankles since he had cellulitis of the first time.  He denies any shortness of breath or chest pain or abdominal pain.  He denies any numbness tingling or weakness (but does note that his right fifth finger has been numb for about the past month).  On [facility] with [facility], they noted that the patient was sent here because he started having shaking to his arms and legs.  On review of the ED providers note, they note that he developed shaking of his arms and legs and that he was able to work with physical therapy today because of this.  ED Course: In the ED he had labs, EKG, blood cultures, vancomycin.  Admit for cellulitis and tremor.   Review of Systems: As per HPI otherwise 10 point review of systems negative.   Past Medical History:  Diagnosis Date  . Alpha thalassemia ([place])   . Anemia 01/01/2017  . BPH (benign  prostatic hyperplasia)   . CHF (congestive heart failure) ([place])   . High cholesterol   . Hypertension   . Lower extremity edema   . Onychomycosis   . Prostate cancer ([place])    S/P "8 weeks of radiation"  . Prostatitis    recurrent  . Sleep apnea   . Stroke [facility]) ~ 2005   denies residual on 2/123/2015  . Type II diabetes mellitus ([place])   . Umbilical hernia   . Urinary urgency    with incontinence    Past Surgical History:  Procedure Laterality Date  . ADENOIDECTOMY    . HERNIA REPAIR    . MASTOIDECTOMY    . TONSILLECTOMY    . VASECTOMY    . VENTRAL HERNIA REPAIR  12/18/2011   Procedure: HERNIA REPAIR VENTRAL ADULT;  Surgeon: Adin Hector, MD;  Location: [facility];  Service: General;  Laterality: N/Jared Hall;     reports that  has never smoked. he has never used smokeless tobacco. He reports that he does not drink alcohol or use drugs.  Allergies  Allergen Reactions  . Ativan [Lorazepam] Other (See Comments)    Flailing of extremities noted immediately s/p IV administration.  [name] Kitchen Flomax [Tamsulosin Hcl] Other (See Comments)    Excessive tearing  . Glucophage [Metformin Hcl] Diarrhea    Family History  Problem Relation Age of Onset  . Pneumonia Mother   .  Heart attack Father    Prior to Admission medications   Medication Sig Start Date End Date Taking? Authorizing Provider  amitriptyline (ELAVIL) 25 MG tablet Take 50 mg by mouth at bedtime.    Yes [provider]  aspirin EC 81 MG tablet Take 81 mg by mouth daily.   Yes [provider]  B Complex Vitamins (VITAMIN B COMPLEX PO) Take 1 tablet by mouth daily.   Yes [provider]  clopidogrel (PLAVIX) 75 MG tablet Take 75 mg by mouth daily.    Yes [provider]  fluticasone (FLONASE) 50 MCG/ACT nasal spray Place 1 spray into both nostrils daily. 03/17/15  Yes Kelvin Cellar, MD  furosemide (LASIX) 80 MG tablet Take 80 mg by mouth 2 (two) times daily.    Yes [provider]    gabapentin (NEURONTIN) 300 MG capsule Take 300 mg by mouth 4 (four) times daily. Hold for sedation.   Yes [provider]  hydrocerin (EUCERIN) CREA Apply 1 application topically daily. Patient taking differently: Apply 1 application topically 2 (two) times daily as needed.  01/09/17  Yes Regalado, Belkys Charvez Voorhies, MD  insulin aspart (NOVOLOG) 100 UNIT/ML injection Inject 4-15 Units into the skin 2 (two) times daily. Blood sugar < 150 = 0 units 151-200 = 4 units 201-250 = 6 units 251-300 = 8 units 301-350 = 10 units 351-400 = 12 units >400 = 15 units   Yes [provider]  insulin glargine (LANTUS) 100 UNIT/ML injection Inject 50 Units into the skin 3 (three) times daily.    Yes [provider]  metoprolol tartrate (LOPRESSOR) 25 MG tablet Take 1 tablet (25 mg total) by mouth 2 (two) times daily. 03/17/15  Yes Kelvin Cellar, MD  pantoprazole (PROTONIX) 40 MG tablet Take 40 mg by mouth daily.    Yes [provider]  potassium chloride (KLOR-CON) 20 MEQ packet Take 20 mEq by mouth daily.    Yes [provider]  senna-docusate (SENOKOT-S) 8.6-50 MG tablet Take 1 tablet by mouth 2 (two) times daily.   Yes [provider]  simvastatin (ZOCOR) 20 MG tablet Take 20 mg by mouth daily.   Yes [provider]  terazosin (HYTRIN) 2 MG capsule Take 4 mg by mouth daily. Take two 2-mg tablets to = 4 mg   Yes [provider]  vitamin B-12 (CYANOCOBALAMIN) 1000 MCG tablet Take 1,000 mcg by mouth daily.   Yes [provider]    Physical Exam: Vitals:   10/17/17 1128 10/17/17 1346 10/17/17 1609 10/17/17 1852  BP: (!) 140/94  (!) 161/75 (!) 155/70  Pulse: 82  73 75  Resp: 16  16 16   Temp: (!) 97.4 F (36.3 C) (!) 96.7 F (35.9 C)    TempSrc: Oral Rectal    SpO2: 100%  100% 100%    Constitutional: NAD, calm, comfortable Vitals:   10/17/17 1128 10/17/17 1346 10/17/17 1609 10/17/17 1852  BP: (!) 140/94  (!) 161/75 (!) 155/70   Pulse: 82  73 75  Resp: 16  16 16   Temp: (!) 97.4 F (36.3 C) (!) 96.7 F (35.9 C)    TempSrc: Oral Rectal    SpO2: 100%  100% 100%   Eyes: PERRL, lids and conjunctivae normal ENMT: Mucous membranes are moist. Posterior pharynx clear of any exudate or lesions.Normal dentition.  Neck: normal, supple, no masses, no thyromegaly Respiratory: clear to auscultation bilaterally, no wheezing, no crackles. Normal respiratory effort. No accessory muscle use.  Cardiovascular: Regular  rate and rhythm, no murmurs / rubs / gallops.  Abdomen: no tenderness, no masses palpated. No hepatosplenomegaly. Bowel sounds positive.  Musculoskeletal: no clubbing / cyanosis. No joint deformity upper and lower extremities. Good ROM, no contractures. Normal muscle tone.  Skin: Erythema to bilateral LE's worse on R with some yellow crusting wounds.  Onychomycosis. 1+ LEE bilaterally. Neurologic: CN 2-12 intact. Sensation intact to light touch except for 5th finger on R hand (he notes maybe for the past month).  Strength 5/5 in all 4.  Intermittently with involuntary tremor of his LE's.  No tremor observed to UE's.  Psychiatric: Normal judgment and insight. Alert and oriented x 2 (said December). Normal mood.   Labs on Admission: I have personally reviewed following labs and imaging studies  CBC: Recent Labs  Lab 10/17/17 1430  WBC 8.2  NEUTROABS 5.3  HGB 11.4*  HCT 37.6*  MCV 71.1*  PLT 163   Basic Metabolic Panel: Recent Labs  Lab 10/17/17 1430  NA 142  K 3.7  CL 101  CO2 36*  GLUCOSE 138*  BUN 16  CREATININE 1.65*  CALCIUM 9.1   GFR: CrCl cannot be calculated (Unknown ideal weight.). Liver Function Tests: Recent Labs  Lab 10/17/17 1430  AST 27  ALT 26  ALKPHOS 85  BILITOT 0.9  PROT 6.9  ALBUMIN 3.5   No results for input(s): LIPASE, AMYLASE in the last 168 hours. No results for input(s): AMMONIA in the last 168 hours. Coagulation Profile: No results for input(s): INR, PROTIME in  the last 168 hours. Cardiac Enzymes: No results for input(s): CKTOTAL, CKMB, CKMBINDEX, TROPONINI in the last 168 hours. BNP (last 3 results) No results for input(s): PROBNP in the last 8760 hours. HbA1C: No results for input(s): HGBA1C in the last 72 hours. CBG: No results for input(s): GLUCAP in the last 168 hours. Lipid Profile: No results for input(s): CHOL, HDL, LDLCALC, TRIG, CHOLHDL, LDLDIRECT in the last 72 hours. Thyroid Function Tests: No results for input(s): TSH, T4TOTAL, FREET4, T3FREE, THYROIDAB in the last 72 hours. Anemia Panel: No results for input(s): VITAMINB12, FOLATE, FERRITIN, TIBC, IRON, RETICCTPCT in the last 72 hours. Urine analysis:    Component Value Date/Time   COLORURINE YELLOW 10/17/2017 1630   APPEARANCEUR CLEAR 10/17/2017 1630   LABSPEC 1.013 10/17/2017 1630   PHURINE 6.0 10/17/2017 1630   GLUCOSEU NEGATIVE 10/17/2017 1630   HGBUR NEGATIVE 10/17/2017 1630   BILIRUBINUR NEGATIVE 10/17/2017 1630   KETONESUR NEGATIVE 10/17/2017 1630   PROTEINUR NEGATIVE 10/17/2017 1630   UROBILINOGEN 1.0 12/06/2014 1414   NITRITE NEGATIVE 10/17/2017 1630   LEUKOCYTESUR NEGATIVE 10/17/2017 1630    Radiological Exams on Admission: Dg Chest 2 View  Result Date: 10/17/2017 CLINICAL DATA:  Weakness, leg pain and swelling EXAM: CHEST  2 VIEW COMPARISON:  01/01/2017 FINDINGS: Stable cardiomegaly with bibasilar atelectasis and left midlung scarring. Chronic elevation of the left hemidiaphragm. Upper lobes remain clear. No enlarging effusion pneumothorax. Negative for edema or CHF. Trachea is midline. IMPRESSION: Stable cardiomegaly with bibasilar and left midlung atelectasis/scarring. Atherosclerosis No significant interval change. Electronically Signed   By: Jerilynn Mages.  Shick M.D.   On: 10/17/2017 17:24    EKG: Independently reviewed. Appears mostly similar to priors.  Sinus rhythm, 1st degree AV block, PVC's.  Q in V4 appears new.  Assessment/Plan Active Problems:    Cellulitis  Bilateral lower extremity cellulitis and edema: Patient with Jared Levick history of bilateral LE cellulitis.  Today, pt with R worse than left redness and warmth as  well as wounds with yellow crusting.   Received dose of vancomycin in ED.  Of note in Jan when d/c'd had wound cx with pseudomonas and providencia.   S/p vanc.  Will transition to ancef and ctm for improvement. Follow up blood cultures Will order ABI    Tremors:  Sounds like this maybe the primary reason he was sent here, etiology unclear at the moment.  When I discussed with staff at [facility], they just noted that he was shaking and had tremor in his legs as well as his arms.  Per EDP note, he was unable to work with PT due to these symptoms.  On my exam, he intermittently had involuntary jerking movements of bilateral lower extremities (I did not notice UE tremor).  No focal deficits on my exam (other than chronic numbness of his right fifth finger which he notes is been going on for at least Yetta Marceaux month), CN 2-12 intact and he had equal strength. Will get head CT and EEG.  Folllow up TSH.  Continue to monitor.  Consider discuss with neuro inpatient vs outpatient follow up. PT/OT   AKI on CKD stage III: Baseline creatinine ~1.2-1.4.  hold diuretics, gentle hydration.  Follow up UA.  Metabolic Alkalosis: Follow up VBG.  Could be compensation with resp disease vs contraction alkalosis (though he has edema).  Gentle hydration as above.  CTM.    History of stroke: continue ASA, plavix  CHF: Grade 1 DD on 12/06/16 echo.  Hold lasix for now with AKI.  HTN: continue metoprolol  HLD: zocor  GERD: PPI  T2DM with peripheral neuropathy: SSI plus home lantus at decreased dose (40 units TID instead of 50 units TID ordered).  Continue gabapentin.  History of PSVT  afib: apparently previously on eliquis, but this has now been d/c'd.  Telemetry.  Metop.   EKG Changes:  New Q in V4. Negative poc trop and no c/o CP.  Consider f/u ekg or  repeat echo outpatient.    Chronic Resp Failure with Hypoxia: Pt on 2 L Jared Hall continuously.   BPH: terazosin  Depression: amitriptyline 50 mg qhs  DVT prophylaxis: heparin  Code Status: DNR (confirmed with pt and family)  Family Communication: son and daughter in law  Disposition Plan: pending improvement  Consults called: none  Admission status: obs   Jared Helper MD [facility] Pager [phone]  If 7PM-7AM, please contact night-coverage [email or web address] Password [facility]  10/17/2017, 7:18 PM

## 2017-10-17 NOTE — ED Notes (Signed)
Patient transported to CT 

## 2017-10-17 NOTE — ED Triage Notes (Signed)
Per PTAR pt from [facility] for shaking and bilat leg pain with swelling and possible cellulitis. Patient on 2L Oxygen via N/C continuously.  [id number]

## 2017-10-18 ENCOUNTER — Observation Stay (HOSPITAL_BASED_OUTPATIENT_CLINIC_OR_DEPARTMENT_OTHER): Payer: Medicare Other

## 2017-10-18 DIAGNOSIS — D560 Alpha thalassemia: Secondary | ICD-10-CM | POA: Diagnosis present

## 2017-10-18 DIAGNOSIS — I471 Supraventricular tachycardia: Secondary | ICD-10-CM | POA: Diagnosis present

## 2017-10-18 DIAGNOSIS — L0390 Cellulitis, unspecified: Secondary | ICD-10-CM | POA: Diagnosis not present

## 2017-10-18 DIAGNOSIS — I5032 Chronic diastolic (congestive) heart failure: Secondary | ICD-10-CM | POA: Diagnosis present

## 2017-10-18 DIAGNOSIS — N179 Acute kidney failure, unspecified: Secondary | ICD-10-CM | POA: Diagnosis not present

## 2017-10-18 DIAGNOSIS — L03116 Cellulitis of left lower limb: Secondary | ICD-10-CM | POA: Diagnosis not present

## 2017-10-18 DIAGNOSIS — I878 Other specified disorders of veins: Secondary | ICD-10-CM | POA: Diagnosis present

## 2017-10-18 DIAGNOSIS — E1151 Type 2 diabetes mellitus with diabetic peripheral angiopathy without gangrene: Secondary | ICD-10-CM | POA: Diagnosis present

## 2017-10-18 DIAGNOSIS — N401 Enlarged prostate with lower urinary tract symptoms: Secondary | ICD-10-CM | POA: Diagnosis present

## 2017-10-18 DIAGNOSIS — R251 Tremor, unspecified: Secondary | ICD-10-CM | POA: Diagnosis not present

## 2017-10-18 DIAGNOSIS — L03119 Cellulitis of unspecified part of limb: Secondary | ICD-10-CM | POA: Diagnosis not present

## 2017-10-18 DIAGNOSIS — K219 Gastro-esophageal reflux disease without esophagitis: Secondary | ICD-10-CM | POA: Diagnosis present

## 2017-10-18 DIAGNOSIS — L03115 Cellulitis of right lower limb: Secondary | ICD-10-CM | POA: Diagnosis not present

## 2017-10-18 DIAGNOSIS — E1142 Type 2 diabetes mellitus with diabetic polyneuropathy: Secondary | ICD-10-CM | POA: Diagnosis not present

## 2017-10-18 DIAGNOSIS — I130 Hypertensive heart and chronic kidney disease with heart failure and stage 1 through stage 4 chronic kidney disease, or unspecified chronic kidney disease: Secondary | ICD-10-CM | POA: Diagnosis present

## 2017-10-18 DIAGNOSIS — B999 Unspecified infectious disease: Secondary | ICD-10-CM | POA: Diagnosis not present

## 2017-10-18 DIAGNOSIS — F329 Major depressive disorder, single episode, unspecified: Secondary | ICD-10-CM | POA: Diagnosis present

## 2017-10-18 DIAGNOSIS — Z9981 Dependence on supplemental oxygen: Secondary | ICD-10-CM | POA: Diagnosis not present

## 2017-10-18 DIAGNOSIS — E669 Obesity, unspecified: Secondary | ICD-10-CM | POA: Diagnosis present

## 2017-10-18 DIAGNOSIS — E1122 Type 2 diabetes mellitus with diabetic chronic kidney disease: Secondary | ICD-10-CM | POA: Diagnosis present

## 2017-10-18 DIAGNOSIS — Z8673 Personal history of transient ischemic attack (TIA), and cerebral infarction without residual deficits: Secondary | ICD-10-CM | POA: Diagnosis not present

## 2017-10-18 DIAGNOSIS — R4182 Altered mental status, unspecified: Secondary | ICD-10-CM | POA: Diagnosis not present

## 2017-10-18 DIAGNOSIS — Z66 Do not resuscitate: Secondary | ICD-10-CM | POA: Diagnosis present

## 2017-10-18 DIAGNOSIS — R3915 Urgency of urination: Secondary | ICD-10-CM | POA: Diagnosis present

## 2017-10-18 DIAGNOSIS — N183 Chronic kidney disease, stage 3 (moderate): Secondary | ICD-10-CM | POA: Diagnosis present

## 2017-10-18 DIAGNOSIS — E785 Hyperlipidemia, unspecified: Secondary | ICD-10-CM | POA: Diagnosis present

## 2017-10-18 DIAGNOSIS — I10 Essential (primary) hypertension: Secondary | ICD-10-CM | POA: Diagnosis not present

## 2017-10-18 DIAGNOSIS — E873 Alkalosis: Secondary | ICD-10-CM | POA: Diagnosis present

## 2017-10-18 DIAGNOSIS — Z6838 Body mass index (BMI) 38.0-38.9, adult: Secondary | ICD-10-CM | POA: Diagnosis not present

## 2017-10-18 DIAGNOSIS — J9611 Chronic respiratory failure with hypoxia: Secondary | ICD-10-CM | POA: Diagnosis present

## 2017-10-18 DIAGNOSIS — I4891 Unspecified atrial fibrillation: Secondary | ICD-10-CM | POA: Diagnosis present

## 2017-10-18 LAB — COMPREHENSIVE METABOLIC PANEL
ALK PHOS: 79 U/L (ref 38–126)
ALT: 25 U/L (ref 17–63)
AST: 29 U/L (ref 15–41)
Albumin: 3.3 g/dL — ABNORMAL LOW (ref 3.5–5.0)
Anion gap: 6 (ref 5–15)
BILIRUBIN TOTAL: 0.6 mg/dL (ref 0.3–1.2)
BUN: 15 mg/dL (ref 6–20)
CALCIUM: 9.1 mg/dL (ref 8.9–10.3)
CHLORIDE: 102 mmol/L (ref 101–111)
CO2: 34 mmol/L — ABNORMAL HIGH (ref 22–32)
CREATININE: 1.48 mg/dL — AB (ref 0.61–1.24)
GFR calc Af Amer: 48 mL/min — ABNORMAL LOW (ref >60)
GFR, EST NON AFRICAN AMERICAN: 42 mL/min — AB (ref >60)
Glucose, Bld: 177 mg/dL — ABNORMAL HIGH (ref 65–99)
Potassium: 3.8 mmol/L (ref 3.5–5.1)
Sodium: 142 mmol/L (ref 135–145)
Total Protein: 6.3 g/dL — ABNORMAL LOW (ref 6.5–8.1)

## 2017-10-18 LAB — CBC
HCT: 35.3 % — ABNORMAL LOW (ref 39.0–52.0)
Hemoglobin: 10.6 g/dL — ABNORMAL LOW (ref 13.0–17.0)
MCH: 21.5 pg — AB (ref 26.0–34.0)
MCHC: 30 g/dL (ref 30.0–36.0)
MCV: 71.5 fL — AB (ref 78.0–100.0)
PLATELETS: 210 10*3/uL (ref 150–400)
RBC: 4.94 MIL/uL (ref 4.22–5.81)
RDW: 15.2 % (ref 11.5–15.5)
WBC: 7.1 10*3/uL (ref 4.0–10.5)

## 2017-10-18 LAB — GLUCOSE, CAPILLARY
GLUCOSE-CAPILLARY: 171 mg/dL — AB (ref 65–99)
GLUCOSE-CAPILLARY: 132 mg/dL — AB (ref 65–99)
Glucose-Capillary: 160 mg/dL — ABNORMAL HIGH (ref 65–99)
Glucose-Capillary: 163 mg/dL — ABNORMAL HIGH (ref 65–99)

## 2017-10-18 MED ORDER — CHLORHEXIDINE GLUCONATE CLOTH 2 % EX PADS
6.0000 | MEDICATED_PAD | Freq: Every day | CUTANEOUS | Status: DC
  Administered 2017-10-19 – 2017-10-20 (×2): 6 via TOPICAL

## 2017-10-18 MED ORDER — AMITRIPTYLINE HCL 25 MG PO TABS
25.0000 mg | ORAL_TABLET | Freq: Every day | ORAL | Status: DC
  Administered 2017-10-19 (×2): 25 mg via ORAL
  Filled 2017-10-18 (×2): qty 1

## 2017-10-18 MED ORDER — GABAPENTIN 100 MG PO CAPS
200.0000 mg | ORAL_CAPSULE | Freq: Four times a day (QID) | ORAL | Status: DC
  Administered 2017-10-18 – 2017-10-20 (×8): 200 mg via ORAL
  Filled 2017-10-18 (×8): qty 2

## 2017-10-18 MED ORDER — INSULIN GLARGINE 100 UNIT/ML ~~LOC~~ SOLN
40.0000 [IU] | Freq: Two times a day (BID) | SUBCUTANEOUS | Status: DC
  Administered 2017-10-19 – 2017-10-20 (×4): 40 [IU] via SUBCUTANEOUS
  Filled 2017-10-18 (×5): qty 0.4

## 2017-10-18 MED ORDER — PIPERACILLIN-TAZOBACTAM 3.375 G IVPB
3.3750 g | Freq: Three times a day (TID) | INTRAVENOUS | Status: DC
  Administered 2017-10-18 – 2017-10-20 (×7): 3.375 g via INTRAVENOUS
  Filled 2017-10-18 (×7): qty 50

## 2017-10-18 MED ORDER — MUPIROCIN 2 % EX OINT
1.0000 "application " | TOPICAL_OINTMENT | Freq: Two times a day (BID) | CUTANEOUS | Status: DC
  Administered 2017-10-18 – 2017-10-20 (×6): 1 via NASAL
  Filled 2017-10-18: qty 22

## 2017-10-18 NOTE — Progress Notes (Signed)
Pharmacy Antibiotic Note  ILAN KAHRS is a 83 y.o. male admitted on 10/17/2017 with cellulitis.  Pharmacy has been consulted for Zosyn dosing given history of wound cultures growing Pseudomonas and Providencia.   Plan: Zosyn 3.375g IV q8h (4 hour infusion time) for CrCl>20 ml/min.  Height: 5\' 10"  (177.8 cm) Weight: 270 lb 11.6 oz (122.8 kg) IBW/kg (Calculated) : 73  Temp (24hrs), Avg:97.5 F (36.4 C), Min:96.7 F (35.9 C), Max:97.9 F (36.6 C)  Recent Labs  Lab 10/17/17 1430 10/17/17 1439 10/18/17 0524  WBC 8.2  --  7.1  CREATININE 1.65*  --  1.48*  LATICACIDVEN  --  0.92  --     Estimated Creatinine Clearance: 48.8 mL/min (A) (by C-G formula based on SCr of 1.48 mg/dL (H)).    Allergies  Allergen Reactions  . Ativan [Lorazepam] Other (See Comments)    Flailing of extremities noted immediately s/p IV administration.  [name] Kitchen Flomax [Tamsulosin Hcl] Other (See Comments)    Excessive tearing  . Glucophage [Metformin Hcl] Diarrhea    Antimicrobials this admission:  11/28 vanc x 1 11/28 cefazolin >> 11/2 11/29 Zosyn >>   Dose adjustments this admission:   Microbiology results:  11/28 BCx:  11/28 MRSA PCR: +  Thank you for allowing pharmacy to be a part of this patient's care.  Hershal Coria 10/18/2017 10:12 AM

## 2017-10-18 NOTE — Progress Notes (Signed)
VASCULAR LAB PRELIMINARY  ARTERIAL  ABI completed:    RIGHT    LEFT    PRESSURE WAVEFORM  PRESSURE WAVEFORM  BRACHIAL 132 Tri BRACHIAL 129 Tri  DP 168 mono DP >254 bi  PT >254 mono PT >254 bi  GREAT TOE  NA GREAT TOE  NA    RIGHT LEFT  ABI 1.3 [facility]   Right: Resting right ankle-brachial index indicates noncompressible right lower extremity arteries. TBIs are unreliable. ABIs are unreliable. Limited exam due to noncompressible vessels and tremmors.  Left: Resting left ankle-brachial index indicates noncompressible right lower extremity arteries. ABIs are unreliable. TBIs are unreliable. Limited exam due to noncompressible vessels and tremmors.     Everrett Coombe, RVT 10/18/2017, 11:10 AM

## 2017-10-18 NOTE — Care Management Note (Signed)
Case Management Note  Patient Details  Name: Jared Hall MRN: [id number] Date of Birth: 1931-11-07  Subjective/Objective: 83 y/o m admitted w/bilateral leg cellulitis. From ALF-[facility]. Hx: HOH.w/c bound. Active Home 02. PT-recc SNF/supv. CSW following for return ALF.                   Action/Plan:d/c plan ALF.   Expected Discharge Date:                  Expected Discharge Plan:  Assisted Living / Rest Home  In-House Referral:  Clinical Social Work  Discharge planning Services  CM Consult  Post Acute Care Choice:  Durable Medical Equipment(home 02) Choice offered to:     DME Arranged:    DME Agency:     HH Arranged:    [place] Agency:     Status of Service:  In process, will continue to follow  If discussed at Long Length of Stay Meetings, dates discussed:    Additional Comments:  Dessa Phi, RN 10/18/2017, 1:15 PM

## 2017-10-18 NOTE — Evaluation (Signed)
Occupational Therapy Evaluation Patient Details Name: Jared Hall MRN: [id number] DOB: December 16, 1930 Today's Date: 10/18/2017    History of Present Illness 83 y.o. male with medical history significant of diastolic heart failure, stroke, type 2 diabetes, chronic respiratory failure on 2 L O2, peripheral neuropathy, multiple multiple other medical problems who presents from [facility] with shakes and lower extremity swelling and redness   Clinical Impression   Pt was admitted for the above. At baseline, he is mod I for adls except for showering. He currently needs min A. Goals in acute are for min guard assist.  May need SNF, unless ALF can provide needed assistance    Follow Up Recommendations  SNF(vs A for ADLS/mobility and HHOT)    Equipment Recommendations  3 in 1 bedside commode    Recommendations for Other Services       Precautions / Restrictions Precautions Precautions: Fall Precaution Comments: on O2 at baseline Restrictions Weight Bearing Restrictions: No      Mobility Bed Mobility           Sit to supine: Min assist   General bed mobility comments: light min A to bring RLE onto bed  Transfers   Equipment used: Rolling walker (2 wheeled)   Sit to Stand: Min assist Stand pivot transfers: Min guard       General transfer comment: min A to rise, min/guard to pivot to bed with RW    Balance                                           ADL either performed or assessed with clinical judgement   ADL Overall ADL's : Needs assistance/impaired Eating/Feeding: Independent   Grooming: Set up;Sitting   Upper Body Bathing: Set up;Sitting   Lower Body Bathing: Minimal assistance;Sit to/from stand   Upper Body Dressing : Set up;Sitting   Lower Body Dressing: Minimal assistance;Sit to/from stand   Toilet Transfer: Minimal assistance;Stand-pivot;RW(chair to bed)   Toileting- Clothing Manipulation and Hygiene: Minimal assistance;Sit  to/from stand(currently with catheter)         General ADL Comments: min A for sit to stand and with SPT.  Pt uses w/c at baseline and performs SPT on his own.  He also needs a little assistance with socks due to pain     Vision         Perception     Praxis      Pertinent Vitals/Pain Faces Pain Scale: Hurts little more Pain Location: BLEs Pain Descriptors / Indicators: Sore Pain Intervention(s): Limited activity within patient's tolerance;Monitored during session;Repositioned     Hand Dominance     Extremity/Trunk Assessment Upper Extremity Assessment Upper Extremity Assessment: Overall WFL for tasks assessed           Communication Communication Communication: HOH(extremely)   Cognition Arousal/Alertness: Awake/alert Behavior During Therapy: WFL for tasks assessed/performed Overall Cognitive Status: Within Functional Limits for tasks assessed                                     General Comments       Exercises     Shoulder Instructions      Home Living Family/patient expects to be discharged to:: Assisted living  Additional Comments: from [facility]      Prior Functioning/Environment          Comments: pt states he was mostly mod I with adls. Staff assists with showers 2x week.          OT Problem List: Decreased activity tolerance;Impaired balance (sitting and/or standing);Pain;Decreased knowledge of use of DME or AE;Decreased knowledge of precautions;Cardiopulmonary status limiting activity      OT Treatment/Interventions: Self-care/ADL training;DME and/or AE instruction;Patient/family education;Balance training;Therapeutic activities;Energy conservation    OT Goals(Current goals can be found in the care plan section) Acute Rehab OT Goals Patient Stated Goal: return to [employer] OT Goal Formulation: With patient Time For Goal Achievement: 10/25/17 Potential to Achieve  Goals: Good ADL Goals Pt Will Transfer to Toilet: with min guard assist;stand pivot transfer;bedside commode Additional ADL Goal #1: pt will complete ADL, sit to stand with min guard assist and set up  OT Frequency: Min 2X/week   Barriers to D/C:            Co-evaluation              AM-PAC PT "6 Clicks" Daily Activity     Outcome Measure Help from another person eating meals?: None Help from another person taking care of personal grooming?: A Little Help from another person toileting, which includes using toliet, bedpan, or urinal?: A Little Help from another person bathing (including washing, rinsing, drying)?: A Little Help from another person to put on and taking off regular upper body clothing?: A Little Help from another person to put on and taking off regular lower body clothing?: A Little 6 Click Score: 19   End of Session Nurse Communication: Mobility status  Activity Tolerance: Patient tolerated treatment well Patient left: in bed;with call bell/phone within reach;with bed alarm set  OT Visit Diagnosis: Pain;Unsteadiness on feet (R26.81) Pain - part of body: Ankle and joints of foot(bil)                Time: 6761-9509 OT Time Calculation (min): 19 min Charges:  OT General Charges $OT Visit: 1 Visit OT Evaluation $OT Eval Low Complexity: 1 Low G-Codes: OT G-codes **NOT FOR INPATIENT CLASS** Functional Assessment Tool Used: Clinical judgement Functional Limitation: Self care Self Care Current Status (T2671): At least 20 percent but less than 40 percent impaired, limited or restricted Self Care Goal Status (I4580): At least 1 percent but less than 20 percent impaired, limited or restricted   Lesle Chris, OTR/L [phone] 10/18/2017  Jared Hall 10/18/2017, 3:38 PM

## 2017-10-18 NOTE — Progress Notes (Signed)
PROGRESS NOTE  Jared Hall Hall HQI:[id number] DOB: 22-May-1931 DOA: 10/17/2017 PCP: Josetta Huddle, MD   LOS: 0 days   Brief Narrative / Interim history: Jared Hall Hall Jared Hall Hall is a 83 y.o. male with medical history significant of diastolic heart failure, stroke, type 2 diabetes, chronic respiratory failure on 2 L O2, peripheral neuropathy, multiple multiple other medical problems who [facility] with shakes and lower extremity swelling and redness today.    Assessment & Plan: Principal Problem:   Cellulitis Active Problems:   History of PSVT (paroxysmal supraventricular tachycardia)   History of CVA (cerebrovascular accident)   Dyslipidemia   Obesity (BMI 30-39.9)   AKI (acute kidney injury) ([place])   Essential hypertension, benign   Depression   DM type 2 with diabetic peripheral neuropathy (HCC)   Tremor  Bilateral lower extremity cellulitis and edema -Patient has a history of lower extremity cellulitis, prior microbiology earlier this year showed Pseudomonas improving dementia.  Discontinue Ancef as it does not cover Pseudomonas, place patient on Zosyn. -Continue to follow blood cultures -ABIs pending -He has some degree of chronic venous stasis changes, however the lower extremities appear bright red and warm to touch, will treat as cellulitis  Tremors -Potentially related to Elavil and/or gabapentin, did not witness any tremor on my exam today -Decrease the dose of Elavil and gabapentin, continue to closely monitor -EEG pending, CT scan with atrophy and small vessel disease and a remote right cerebellar infarct, but no evidence for any acute abnormalities  Acute on chronic kidney disease stage III -Baseline creatinine 1.3-1.4, 1.6 on admission, he was placed on IV fluids and his creatinine is now approaching baseline at 1.4 -Continue IV fluids overnight  Prior CVA -Continue aspirin, Plavix  Chronic diastolic CHF -Hold Lasix, continue to closely monitor on IV fluids, can  probably discontinue IV fluids tomorrow morning based on renal function  Hypertension -Continue metoprolol  Hyperlipidemia -Continue statin  Type 2 diabetes mellitus with peripheral neuropathy -Continue Lantus and sliding scale, continue gabapentin however decreased dose due to tremors  History of PSVT/A. fib -Previously on Eliquis, but this is now discontinued.  Continue metoprolol  Chronic hypoxic respiratory failure -Continue home oxygen   DVT prophylaxis: heparin Code Status: DNR Family Communication: no family at bedside Disposition Plan: back to SNF when improved  Consultants:   None   Procedures:   None   Antimicrobials:  Ancef 11/28 >> 11/29  Zosyn 11/29 >>  Subjective: -Very hard of hearing, has no complaints for me.  No chest pain, no shortness of breath.  No abdominal pain, nausea or vomiting  Objective: Vitals:   10/17/17 1609 10/17/17 1852 10/17/17 2100 10/18/17 0607  BP: (!) 161/75 (!) 155/70 134/87 135/62  Pulse: 73 75 87 77  Resp: 16 16 20 18   Temp:   97.9 F (36.6 C) 97.8 F (36.6 C)  TempSrc:   Oral Oral  SpO2: 100% 100% 99% 96%  Weight:   122.8 kg (270 lb 11.6 oz)   Height:   5\' 10"  (1.778 m)     Intake/Output Summary (Last 24 hours) at 10/18/2017 1505 Last data filed at 10/17/2017 1933 Gross per 24 hour  Intake 200 ml  Output -  Net 200 ml   Filed Weights   10/17/17 2100  Weight: 122.8 kg (270 lb 11.6 oz)    Examination:  Constitutional: NAD Eyes: lids and conjunctivae normal ENMT: Mucous membranes are moist.  Respiratory: clear to auscultation bilaterally, no wheezing, no crackles.  Cardiovascular: Regular  rate and rhythm, no murmurs / rubs / gallops. No LE edema.  Abdomen: no tenderness. Bowel sounds positive.  Skin: Bilateral lower extremity rash bright red, warm to touch, nontender.  Margins drawn Neurologic: non focal    Data Reviewed: I have independently reviewed following labs and imaging studies    CBC: Recent Labs  Lab 2017-11-02 1430 10/18/17 0524  WBC 8.2 7.1  NEUTROABS 5.3  --   HGB 11.4* 10.6*  HCT 37.6* 35.3*  MCV 71.1* 71.5*  PLT 204 154   Basic Metabolic Panel: Recent Labs  Lab 2017-11-02 1430 10/18/17 0524  NA 142 142  K 3.7 3.8  CL 101 102  CO2 36* 34*  GLUCOSE 138* 177*  BUN 16 15  CREATININE 1.65* 1.48*  CALCIUM 9.1 9.1   GFR: Estimated Creatinine Clearance: 48.8 mL/min (A) (by C-G formula based on SCr of 1.48 mg/dL (H)). Liver Function Tests: Recent Labs  Lab 11/02/2017 1430 10/18/17 0524  AST 27 29  ALT 26 25  ALKPHOS 85 79  BILITOT 0.9 0.6  PROT 6.9 6.3*  ALBUMIN 3.5 3.3*   No results for input(s): LIPASE, AMYLASE in the last 168 hours. No results for input(s): AMMONIA in the last 168 hours. Coagulation Profile: No results for input(s): INR, PROTIME in the last 168 hours. Cardiac Enzymes: No results for input(s): CKTOTAL, CKMB, CKMBINDEX, TROPONINI in the last 168 hours. BNP (last 3 results) No results for input(s): PROBNP in the last 8760 hours. HbA1C: No results for input(s): HGBA1C in the last 72 hours. CBG: Recent Labs  Lab 02-Nov-2017 2219 10/18/17 0814 10/18/17 1124  GLUCAP 102* 160* 163*   Lipid Profile: No results for input(s): CHOL, HDL, LDLCALC, TRIG, CHOLHDL, LDLDIRECT in the last 72 hours. Thyroid Function Tests: Recent Labs    Nov 02, 2017 2124  TSH 3.284   Anemia Panel: No results for input(s): VITAMINB12, FOLATE, FERRITIN, TIBC, IRON, RETICCTPCT in the last 72 hours. Urine analysis:    Component Value Date/Time   COLORURINE YELLOW 2017-11-02 1630   APPEARANCEUR CLEAR 11/02/2017 1630   LABSPEC 1.013 Nov 02, 2017 1630   PHURINE 6.0 11-02-17 1630   GLUCOSEU NEGATIVE 11/02/17 1630   HGBUR NEGATIVE 2017-11-02 1630   BILIRUBINUR NEGATIVE 2017/11/02 1630   KETONESUR NEGATIVE 11-02-2017 1630   PROTEINUR NEGATIVE 2017-11-02 1630   UROBILINOGEN 1.0 12/06/2014 1414   NITRITE NEGATIVE 11/02/17 1630   LEUKOCYTESUR  NEGATIVE 11/02/2017 1630   Sepsis Labs: Invalid input(s): PROCALCITONIN, LACTICIDVEN  Recent Results (from the past 240 hour(s))  Blood culture (routine x 2)     Status: None (Preliminary result)   Collection Time: 2017-11-02  6:06 PM  Result Value Ref Range Status   Specimen Description BLOOD BLOOD LEFT HAND  Final   Special Requests   Final    BOTTLES DRAWN AEROBIC AND ANAEROBIC Blood Culture adequate volume   Culture   Final    NO GROWTH < 24 HOURS Performed at [facility], [address] [place], [place] [address]    Report Status PENDING  Incomplete  MRSA PCR Screening     Status: Abnormal   Collection Time: 11-02-17  9:14 PM  Result Value Ref Range Status   MRSA by PCR POSITIVE (A) NEGATIVE Final    Comment:        The GeneXpert MRSA Assay (FDA approved for NASAL specimens only), is one component of a comprehensive MRSA colonization surveillance program. It is not intended to diagnose MRSA infection nor to guide or monitor treatment for MRSA  infections. RESULT CALLED TO, READ BACK BY AND VERIFIED WITH: Carmela Hurt [id number] @ 2321 BY J SCOTTON   Blood culture (routine x 2)     Status: None (Preliminary result)   Collection Time: 10/17/17  9:24 PM  Result Value Ref Range Status   Specimen Description BLOOD RIGHT ANTECUBITAL  Final   Special Requests IN PEDIATRIC BOTTLE Blood Culture adequate volume  Final   Culture   Final    NO GROWTH < 24 HOURS Performed at [facility], [place] [address] [place], [place] [address]    Report Status PENDING  Incomplete      Radiology Studies: Dg Chest 2 View  Result Date: 10/17/2017 CLINICAL DATA:  Weakness, leg pain and swelling EXAM: CHEST  2 VIEW COMPARISON:  01/01/2017 FINDINGS: Stable cardiomegaly with bibasilar atelectasis and left midlung scarring. Chronic elevation of the left hemidiaphragm. Upper lobes remain clear. No enlarging effusion pneumothorax. Negative for edema or CHF. Trachea is midline.  IMPRESSION: Stable cardiomegaly with bibasilar and left midlung atelectasis/scarring. Atherosclerosis No significant interval change. Electronically Signed   By: Jerilynn Mages.  Shick M.D.   On: 10/17/2017 17:24   Ct Head Wo Contrast  Result Date: 10/17/2017 CLINICAL DATA:  pt from [facility] for shaking and bilat leg pain with swelling and possible cellulitis. EXAM: CT HEAD WITHOUT CONTRAST TECHNIQUE: Contiguous axial images were obtained from the base of the skull through the vertex without intravenous contrast. COMPARISON:  02/13/2017 FINDINGS: Brain: There is central and cortical atrophy. Periventricular white matter changes are consistent with small vessel disease. Remote cortical infarct of the right cerebellum. There is no intra or extra-axial fluid collection or mass lesion. The basilar cisterns and ventricles have a normal appearance. There is no CT evidence for acute infarction or hemorrhage. Vascular: There is dense atherosclerotic calcification of the internal carotid arteries. Skull: Normal. Negative for fracture or focal lesion. Sinuses/Orbits: Mild mucosal thickening of the paranasal sinuses. Mastoid air cells are normally aerated. Other: None. IMPRESSION: 1. Atrophy and small vessel disease. Remote right cerebellar infarct. 2.  No evidence for acute  abnormality. 3. Mild sinus disease. Electronically Signed   By: Nolon Nations M.D.   On: 10/17/2017 20:19     Scheduled Meds: . amitriptyline  25 mg Oral QHS  . aspirin EC  81 mg Oral Daily  . B-complex with vitamin C  1 tablet Oral Daily  . Chlorhexidine Gluconate Cloth  6 each Topical Q0600  . clopidogrel  75 mg Oral Daily  . fluticasone  1 spray Each Nare Daily  . gabapentin  200 mg Oral QID  . heparin  5,000 Units Subcutaneous Q8H  . insulin aspart  0-15 Units Subcutaneous TID WC  . insulin aspart  0-5 Units Subcutaneous QHS  . insulin glargine  40 Units Subcutaneous BID  . metoprolol tartrate  25 mg Oral BID  . mupirocin ointment  1  application Nasal BID  . pantoprazole  40 mg Oral Daily  . potassium chloride  20 mEq Oral Daily  . senna-docusate  1 tablet Oral BID  . simvastatin  20 mg Oral Daily  . terazosin  4 mg Oral Daily  . vitamin B-12  1,000 mcg Oral Daily   Continuous Infusions: . sodium chloride 75 mL/hr at 10/17/17 2144  . piperacillin-tazobactam (ZOSYN)  IV 3.375 g (10/18/17 1157)    Marzetta Board, MD, PhD [facility] Pager [phone]  If 7PM-7AM, please contact night-coverage [email or web address] Password [email or web address] 10/18/2017, 3:05 PM

## 2017-10-18 NOTE — Evaluation (Signed)
Physical Therapy Evaluation Patient Details Name: Jared Hall MRN: [id number] DOB: 1931-02-02 Today's Date: 10/18/2017   History of Present Illness  83 y.o. male with medical history significant of diastolic heart failure, stroke, type 2 diabetes, chronic respiratory failure on 2 L O2, peripheral neuropathy, multiple multiple other medical problems who presents from [facility] with shakes and lower extremity swelling and redness  Clinical Impression  Pt admitted with above diagnosis. Pt currently with functional limitations due to the deficits listed below (see PT Problem List). Min assist for bed to recliner transfer. Pt is near baseline with mobility, he transfers to [facility] independently at SNF, has been non-ambulatory for 83 years per pt.  Pt will benefit from skilled PT to increase their independence and safety with mobility to allow discharge to the venue listed below.       Follow Up Recommendations SNF;Supervision for mobility/OOB    Equipment Recommendations  None recommended by PT    Recommendations for Other Services       Precautions / Restrictions Precautions Precautions: Fall Precaution Comments: on O2 at baseline Restrictions Weight Bearing Restrictions: No      Mobility  Bed Mobility Overal bed mobility: Needs Assistance Bed Mobility: Supine to Sit     Supine to sit: HOB elevated;Min guard     General bed mobility comments: min A to pivot hips to EOB with pad  Transfers Overall transfer level: Needs assistance Equipment used: Rolling walker (2 wheeled) Transfers: Sit to/from [facility] Sit to Stand: Min assist Stand pivot transfers: Min guard       General transfer comment: min A to rise, min/guard to pivot to recliner with RW  Ambulation/Gait             General Gait Details: deferred, pt non ambulatory at baseline  Stairs            Wheelchair Mobility    Modified Rankin (Stroke Patients Only)       Balance  Overall balance assessment: Modified Independent                                           Pertinent Vitals/Pain Pain Assessment: Faces Faces Pain Scale: Hurts little more Pain Location: BLEs Pain Descriptors / Indicators: Sore Pain Intervention(s): Limited activity within patient's tolerance;Monitored during session    Home Living Family/patient expects to be discharged to:: Skilled nursing facility                      Prior Function Level of Independence: Needs assistance   Gait / Transfers Assistance Needed: transfers to [facility] independently, self propels WC to dining room, dresses self; pt stated he's been non ambulatory for 5 years  ADL's / Homemaking Assistance Needed: assist to bathe        Hand Dominance        Extremity/Trunk Assessment   Upper Extremity Assessment Upper Extremity Assessment: Overall WFL for tasks assessed    Lower Extremity Assessment Lower Extremity Assessment: Generalized weakness(knee ext at least 3/5 B; did not do manual muscle testing 2* pain/redness BLEs)    Cervical / Trunk Assessment Cervical / Trunk Assessment: Normal  Communication   Communication: HOH  Cognition Arousal/Alertness: Awake/alert Behavior During Therapy: WFL for tasks assessed/performed Overall Cognitive Status: Within Functional Limits for tasks assessed  General Comments      Exercises     Assessment/Plan    PT Assessment Patient needs continued PT services  PT Problem List Decreased strength;Decreased activity tolerance;Decreased mobility       PT Treatment Interventions DME instruction;Functional mobility training;Therapeutic activities;Therapeutic exercise;Patient/family education    PT Goals (Current goals can be found in the Care Plan section)  Acute Rehab PT Goals Patient Stated Goal: return to [facility] PT Goal Formulation: With patient Time For Goal  Achievement: 11/01/17 Potential to Achieve Goals: Good    Frequency Min 3X/week   Barriers to discharge        Co-evaluation               AM-PAC PT "6 Clicks" Daily Activity  Outcome Measure Difficulty turning over in bed (including adjusting bedclothes, sheets and blankets)?: A Little Difficulty moving from lying on back to sitting on the side of the bed? : Unable Difficulty sitting down on and standing up from a chair with arms (e.g., wheelchair, bedside commode, etc,.)?: Unable Help needed moving to and from a bed to chair (including a wheelchair)?: A Little Help needed walking in hospital room?: Total Help needed climbing 3-5 steps with a railing? : Total 6 Click Score: 10    End of Session Equipment Utilized During Treatment: Gait belt;Oxygen Activity Tolerance: Patient tolerated treatment well Patient left: in chair;with call bell/phone within reach(vascular tech in room) Nurse Communication: Mobility status PT Visit Diagnosis: Muscle weakness (generalized) (M62.81);Pain Pain - Right/Left: Right Pain - part of body: Leg    Time: 1012-1033 PT Time Calculation (min) (ACUTE ONLY): 21 min   Charges:   PT Evaluation $PT Eval Low Complexity: 1 Low     PT G Codes:   PT G-Codes **NOT FOR INPATIENT CLASS** Functional Assessment Tool Used: AM-PAC 6 Clicks Basic Mobility Functional Limitation: Mobility: Walking and moving around Mobility: Walking and Moving Around Current Status (B8466): At least 60 percent but less than 80 percent impaired, limited or restricted Mobility: Walking and Moving Around Goal Status [phone]): At least 40 percent but less than 60 percent impaired, limited or restricted    Jared Hall 10/18/2017, 10:46 AM [phone]

## 2017-10-18 NOTE — Care Management Obs Status (Signed)
[facility] NOTIFICATION   Patient Details  Name: Jared Hall MRN: [id number] Date of Birth: Sep 26, 1931   Medicare Observation Status Notification Given:  Yes    MahabirJuliann Pulse, RN 10/18/2017, 1:22 PM

## 2017-10-19 ENCOUNTER — Inpatient Hospital Stay (HOSPITAL_COMMUNITY)
Admit: 2017-10-19 | Discharge: 2017-10-19 | Disposition: A | Discharge status: Home / Self Care | Payer: Medicare Other | Attending: Family Medicine | Admitting: Family Medicine

## 2017-10-19 DIAGNOSIS — L03115 Cellulitis of right lower limb: Principal | ICD-10-CM

## 2017-10-19 DIAGNOSIS — L03116 Cellulitis of left lower limb: Secondary | ICD-10-CM

## 2017-10-19 DIAGNOSIS — Z8673 Personal history of transient ischemic attack (TIA), and cerebral infarction without residual deficits: Secondary | ICD-10-CM

## 2017-10-19 DIAGNOSIS — E1142 Type 2 diabetes mellitus with diabetic polyneuropathy: Secondary | ICD-10-CM

## 2017-10-19 DIAGNOSIS — R251 Tremor, unspecified: Secondary | ICD-10-CM

## 2017-10-19 DIAGNOSIS — I10 Essential (primary) hypertension: Secondary | ICD-10-CM

## 2017-10-19 LAB — GLUCOSE, CAPILLARY
GLUCOSE-CAPILLARY: 267 mg/dL — AB (ref 65–99)
Glucose-Capillary: 205 mg/dL — ABNORMAL HIGH (ref 65–99)
Glucose-Capillary: 217 mg/dL — ABNORMAL HIGH (ref 65–99)
Glucose-Capillary: 101 mg/dL — ABNORMAL HIGH (ref 65–99)

## 2017-10-19 MED ORDER — TRAMADOL HCL 50 MG PO TABS
50.0000 mg | ORAL_TABLET | Freq: Four times a day (QID) | ORAL | Status: DC | PRN
  Administered 2017-10-19 – 2017-10-20 (×2): 50 mg via ORAL
  Filled 2017-10-19 (×2): qty 1

## 2017-10-19 NOTE — Progress Notes (Signed)
EEG in progress 

## 2017-10-19 NOTE — Progress Notes (Signed)
Pharmacy Antibiotic Note  Jared Hall is a 83 y.o. male admitted on 10/17/2017 with cellulitis.  Pharmacy has been consulted for Zosyn dosing given history of wound cultures growing Pseudomonas and Providencia.   Day #2 Zosyn. SCr improving.  Plan: Continue Zosyn 3.375g IV q8h (4 hour infusion time) for CrCl>20 ml/min. Current dosage is appropriate and need for further dosage adjustment appears unlikely at present. Will sign off at this time.  Please reconsult if a change in clinical status warrants re-evaluation of dosage.  Height: 5\' 10"  (177.8 cm) Weight: 270 lb 11.6 oz (122.8 kg) IBW/kg (Calculated) : 73  Temp (24hrs), Avg:98.1 F (36.7 C), Min:97.9 F (36.6 C), Max:98.2 F (36.8 C)  Recent Labs  Lab 10/17/17 1430 10/17/17 1439 10/18/17 0524  WBC 8.2  --  7.1  CREATININE 1.65*  --  1.48*  LATICACIDVEN  --  0.92  --     Estimated Creatinine Clearance: 48.8 mL/min (A) (by C-G formula based on SCr of 1.48 mg/dL (H)).    Allergies  Allergen Reactions  . Ativan [Lorazepam] Other (See Comments)    Flailing of extremities noted immediately s/p IV administration.  [name] Kitchen Flomax [Tamsulosin Hcl] Other (See Comments)    Excessive tearing  . Glucophage [Metformin Hcl] Diarrhea    Antimicrobials this admission:  11/28 vanc x 1 11/28 cefazolin >> 11/2 11/29 Zosyn >>   Dose adjustments this admission:   Microbiology results:  11/28 BCx: ngtd 11/28 MRSA PCR: +  Thank you for allowing pharmacy to be a part of this patient's care.  Hershal Coria 10/19/2017 1:19 PM

## 2017-10-19 NOTE — Progress Notes (Signed)
Inpatient Diabetes Program Recommendations  AACE/ADA: New Consensus Statement on Inpatient Glycemic Control (2015)  Target Ranges:  Prepandial:   less than 140 mg/dL      Peak postprandial:   less than 180 mg/dL (1-2 hours)      Critically ill patients:  140 - 180 mg/dL   Lab Results  Component Value Date   GLUCAP 267 (H) 10/19/2017   HGBA1C 8.6 (H) 01/02/2017    Review of Glycemic Control  Blood sugars > 180 mg/dL. Needs insulin adjustment.  Inpatient Diabetes Program Recommendations:    Increase Lantus to 50 units bid. Increase Novolog to 0-20 units tidwc and hs  Will continue to follow. ?? Why pt on Lantus 50 units tidwc at facility?  Thank you. Lorenda Peck, RD, LDN, CDE Inpatient Diabetes Coordinator [phone]

## 2017-10-19 NOTE — Progress Notes (Signed)
PROGRESS NOTE    Jared Hall  PPJ:[id number] DOB: Sep 14, 1931 DOA: 10/17/2017 PCP: Josetta Huddle, MD   Brief Narrative: 83 y.o.malewith medical history significant ofdiastolic heart failure, stroke, type 2 diabetes, chronic respiratory failure on 2 L O2, peripheral neuropathy, multiple multiple other medical problems who presents from [facility] with shakes and lowerextremityswelling and redness.  Assessment & Plan:  #Bilateral lower extremity cellulitis: Gradually improving with IV Zosyn.  Plan to continue.  ABI with no occlusive disease.  Patient has chronic venous stasis changes. -Doppler ultrasound negative for DVT in May 2018.  I do not think patient has DVT. -Continue supportive care. -Added tramadol for pain management  #Tremors: Did not notice tremor today.  EEG pending.  CT scan of head with no acute finding.  The dose of Elavil and gabapentin reduced.  #Chronic kidney disease stage III: Serum creatinine level around baseline.  Monitor BMP.  I went for toxins.  # h/o stroke/PVD: Continue aspirin, Plavix and statin.  # Chronic diastolic congestive heart failure: Evolving.  DC IV fluid.  # Hypertension: Continue metoprolol.  # Hyperlipidemia: Continue statin  # Type 2 diabetes with peripheral neuropathy: Continue current dose of insulin.  Monitor blood sugar level.  # History of PSVT:   Continue metoprolol  # Chronic respiratory failure with hypoxia: Continue 2 L of oxygen.  Stable.  DVT prophylaxis: Heparin subcutaneous Code Status: DNR Family Communication: No family at bedside Disposition Plan: Currently admitted    Consultants:   None  Procedures: None Antimicrobials: Zosyn  Subjective: Seen and examined at bedside.  Patient is hard of hearing.  Has some discomfort in his legs.  Denied nausea vomiting chest pain shortness of breath.  Objective: Vitals:   10/18/17 0607 10/18/17 1400 10/18/17 2320 10/19/17 0544  BP: 135/62 (!) 151/65 123/60  137/83  Pulse: 77 73 80 78  Resp: 18 16 18 20   Temp: 97.8 F (36.6 C) 97.9 F (36.6 C) 98.1 F (36.7 C) 98.2 F (36.8 C)  TempSrc: Oral Oral Oral Oral  SpO2: 96% 99% 96% 98%  Weight:      Height:        Intake/Output Summary (Last 24 hours) at 10/19/2017 1223 Last data filed at 10/19/2017 1000 Gross per 24 hour  Intake 2165 ml  Output 701 ml  Net 1464 ml   Filed Weights   10/17/17 2100  Weight: 122.8 kg (270 lb 11.6 oz)    Examination:  General exam: Appears calm and comfortable  Respiratory system: Clear to auscultation. Respiratory effort normal. No wheezing or crackle Cardiovascular system: S1 & S2 heard, RRR.  No pedal edema. Gastrointestinal system: Abdomen is nondistended, soft and nontender. Normal bowel sounds heard. Central nervous system: Alert awake and following commands Extremities: Bilateral lower extremities erythematous changes with edema right more than left. Psychiatry:  Mood & affect appropriate.     Data Reviewed: I have personally reviewed following labs and imaging studies  CBC: Recent Labs  Lab 10/17/17 1430 10/18/17 0524  WBC 8.2 7.1  NEUTROABS 5.3  --   HGB 11.4* 10.6*  HCT 37.6* 35.3*  MCV 71.1* 71.5*  PLT 204 580   Basic Metabolic Panel: Recent Labs  Lab 10/17/17 1430 10/18/17 0524  NA 142 142  K 3.7 3.8  CL 101 102  CO2 36* 34*  GLUCOSE 138* 177*  BUN 16 15  CREATININE 1.65* 1.48*  CALCIUM 9.1 9.1   GFR: Estimated Creatinine Clearance: 48.8 mL/min (A) (by C-G formula based on SCr of  1.48 mg/dL (H)). Liver Function Tests: Recent Labs  Lab 10/17/17 1430 10/18/17 0524  AST 27 29  ALT 26 25  ALKPHOS 85 79  BILITOT 0.9 0.6  PROT 6.9 6.3*  ALBUMIN 3.5 3.3*   No results for input(s): LIPASE, AMYLASE in the last 168 hours. No results for input(s): AMMONIA in the last 168 hours. Coagulation Profile: No results for input(s): INR, PROTIME in the last 168 hours. Cardiac Enzymes: No results for input(s): CKTOTAL,  CKMB, CKMBINDEX, TROPONINI in the last 168 hours. BNP (last 3 results) No results for input(s): PROBNP in the last 8760 hours. HbA1C: No results for input(s): HGBA1C in the last 72 hours. CBG: Recent Labs  Lab 10/18/17 1124 10/18/17 1654 10/18/17 2319 10/19/17 0812 10/19/17 1125  GLUCAP 163* 171* 132* 205* 267*   Lipid Profile: No results for input(s): CHOL, HDL, LDLCALC, TRIG, CHOLHDL, LDLDIRECT in the last 72 hours. Thyroid Function Tests: Recent Labs    10/17/17 2124  TSH 3.284   Anemia Panel: No results for input(s): VITAMINB12, FOLATE, FERRITIN, TIBC, IRON, RETICCTPCT in the last 72 hours. Sepsis Labs: Recent Labs  Lab 10/17/17 1439  LATICACIDVEN 0.92    Recent Results (from the past 240 hour(s))  Blood culture (routine x 2)     Status: None (Preliminary result)   Collection Time: 10/17/17  6:06 PM  Result Value Ref Range Status   Specimen Description BLOOD BLOOD LEFT HAND  Final   Special Requests   Final    BOTTLES DRAWN AEROBIC AND ANAEROBIC Blood Culture adequate volume   Culture   Final    NO GROWTH < 24 HOURS Performed at [facility], [place] [address] [place], [place] [address]    Report Status PENDING  Incomplete  MRSA PCR Screening     Status: Abnormal   Collection Time: 10/17/17  9:14 PM  Result Value Ref Range Status   MRSA by PCR POSITIVE (A) NEGATIVE Final    Comment:        The GeneXpert MRSA Assay (FDA approved for NASAL specimens only), is one component of a comprehensive MRSA colonization surveillance program. It is not intended to diagnose MRSA infection nor to guide or monitor treatment for MRSA infections. RESULT CALLED TO, READ BACK BY AND VERIFIED WITH: Carmela Hurt [id number] @ 2321 BY J SCOTTON   Blood culture (routine x 2)     Status: None (Preliminary result)   Collection Time: 10/17/17  9:24 PM  Result Value Ref Range Status   Specimen Description BLOOD RIGHT ANTECUBITAL  Final   Special Requests IN PEDIATRIC BOTTLE  Blood Culture adequate volume  Final   Culture   Final    NO GROWTH < 24 HOURS Performed at [facility], [place] [address], [place], [place] [address]    Report Status PENDING  Incomplete         Radiology Studies: Dg Chest 2 View  Result Date: 10/17/2017 CLINICAL DATA:  Weakness, leg pain and swelling EXAM: CHEST  2 VIEW COMPARISON:  01/01/2017 FINDINGS: Stable cardiomegaly with bibasilar atelectasis and left midlung scarring. Chronic elevation of the left hemidiaphragm. Upper lobes remain clear. No enlarging effusion pneumothorax. Negative for edema or CHF. Trachea is midline. IMPRESSION: Stable cardiomegaly with bibasilar and left midlung atelectasis/scarring. Atherosclerosis No significant interval change. Electronically Signed   By: Jerilynn Mages.  Shick M.D.   On: 10/17/2017 17:24   Ct Head Wo Contrast  Result Date: 10/17/2017 CLINICAL DATA:  pt from [facility] for shaking and  bilat leg pain with swelling and possible cellulitis. EXAM: CT HEAD WITHOUT CONTRAST TECHNIQUE: Contiguous axial images were obtained from the base of the skull through the vertex without intravenous contrast. COMPARISON:  02/13/2017 FINDINGS: Brain: There is central and cortical atrophy. Periventricular white matter changes are consistent with small vessel disease. Remote cortical infarct of the right cerebellum. There is no intra or extra-axial fluid collection or mass lesion. The basilar cisterns and ventricles have a normal appearance. There is no CT evidence for acute infarction or hemorrhage. Vascular: There is dense atherosclerotic calcification of the internal carotid arteries. Skull: Normal. Negative for fracture or focal lesion. Sinuses/Orbits: Mild mucosal thickening of the paranasal sinuses. Mastoid air cells are normally aerated. Other: None. IMPRESSION: 1. Atrophy and small vessel disease. Remote right cerebellar infarct. 2.  No evidence for acute  abnormality. 3. Mild sinus disease. Electronically Signed    By: Nolon Nations M.D.   On: 10/17/2017 20:19        Scheduled Meds: . amitriptyline  25 mg Oral QHS  . aspirin EC  81 mg Oral Daily  . B-complex with vitamin C  1 tablet Oral Daily  . Chlorhexidine Gluconate Cloth  6 each Topical Q0600  . clopidogrel  75 mg Oral Daily  . fluticasone  1 spray Each Nare Daily  . gabapentin  200 mg Oral QID  . heparin  5,000 Units Subcutaneous Q8H  . insulin aspart  0-15 Units Subcutaneous TID WC  . insulin aspart  0-5 Units Subcutaneous QHS  . insulin glargine  40 Units Subcutaneous BID  . metoprolol tartrate  25 mg Oral BID  . mupirocin ointment  1 application Nasal BID  . pantoprazole  40 mg Oral Daily  . potassium chloride  20 mEq Oral Daily  . senna-docusate  1 tablet Oral BID  . simvastatin  20 mg Oral Daily  . terazosin  4 mg Oral Daily  . vitamin B-12  1,000 mcg Oral Daily   Continuous Infusions: . piperacillin-tazobactam (ZOSYN)  IV 3.375 g (10/19/17 1148)     LOS: 1 day    Clayden Withem Tanna Furry, MD [facility] Pager [phone]  If 7PM-7AM, please contact night-coverage [email or web address] Password [facility] 10/19/2017, 12:23 PM

## 2017-10-19 NOTE — Progress Notes (Signed)
Offsite EEG completed at [facility], results pending. 

## 2017-10-19 NOTE — Clinical Social Work Note (Signed)
Clinical Social Work Assessment  Patient Details  Name: Jared Hall MRN: [id number] Date of Birth: 1931/03/01  Date of referral:  10/19/17               Reason for consult:  Facility Placement, Discharge Planning                Permission sought to share information with:  Facility Art therapist granted to share information::  Yes, Verbal Permission Granted  Name::        Agency::     Relationship::     Contact Information:     Housing/Transportation Living arrangements for the past 2 months:  Assisted Living Facility([facility]) Source of Information:  Patient Patient Interpreter Needed:  None Criminal Activity/Legal Involvement Pertinent to Current Situation/Hospitalization:  No - Comment as needed Significant Relationships:  Adult Children Lives with:  Facility Resident Do you feel safe going back to the place where you live?  Yes Need for family participation in patient care:  No (Coment)  Care giving concerns:  Patient from [facility]. Staff member from [employer] reported that patient required some assistance with ADLs and was independent with feeding. Staff reported that patient was able to transfer to wheel chair and pivot chair independently. PT recommending SNF, staff informed and reported that they can meet all of patient's care needs. Staff reported that patient is currently receiving HHPT and HHRN at ALF.   Social Worker assessment / plan:  CSW spoke with patient at bedside regarding dc planning. Patient reported plan to dc back to [facility]. Patient reported that if he had to be somewhere other than home it would be [facility]. Patient reports that staff looks after him well. CSW inquired if patient wanted his son to be updated on his discharge plans, patient reported that his son works 12 hour shifts and didn't need to be contacted.   CSW will complete FL2 and assist patient with discharge back to ALF when medically  stable.   Employment status:  Retired Forensic scientist:  Medicare PT Recommendations:  [place] / Referral to community resources:  (Patient from facility)  Patient/Family's Response to care:  Patient thanked CSW for listening.   Patient/Family's Understanding of and Emotional Response to Diagnosis, Current Treatment, and Prognosis:  Presented calm and engaged throughout assessment. Patient informed CSW about his transition to ALF and informed CSW about the different things he liked at the ALF. Patient and CSW discussed patient's support system, patient reported that he has a son and daughter-in-law that lives locally and he sees them often. CSW provided active listening and positively affirmed patient's satisfaction with his ALF and support from his family. Patient verbalized plan to dc back to ALF and thanked CSW for listening, CSW thanked patient for sharing.   Emotional Assessment Appearance:  Appears stated age Attitude/Demeanor/Rapport:  Other(cooperative/open) Affect (typically observed):  Calm Orientation:  Oriented to Self, Oriented to Place, Oriented to Situation Alcohol / Substance use:  Not Applicable Psych involvement (Current and /or in the community):  No (Comment)  Discharge Needs  Concerns to be addressed:  Care Coordination Readmission within the last 30 days:  No Current discharge risk:  None Barriers to Discharge:  Continued Medical Work up   [employer], LCSW 10/19/2017, 2:09 PM

## 2017-10-20 DIAGNOSIS — N179 Acute kidney failure, unspecified: Secondary | ICD-10-CM

## 2017-10-20 DIAGNOSIS — L03119 Cellulitis of unspecified part of limb: Secondary | ICD-10-CM

## 2017-10-20 LAB — GLUCOSE, CAPILLARY
GLUCOSE-CAPILLARY: 110 mg/dL — AB (ref 65–99)
Glucose-Capillary: 175 mg/dL — ABNORMAL HIGH (ref 65–99)

## 2017-10-20 MED ORDER — GABAPENTIN 300 MG PO CAPS: 300.0000 mg | ORAL_CAPSULE | Freq: Two times a day (BID) | ORAL | Status: DC

## 2017-10-20 MED ORDER — AMOXICILLIN-POT CLAVULANATE 875-125 MG PO TABS: 1.0000 | ORAL_TABLET | Freq: Two times a day (BID) | ORAL | 0 refills | Status: AC

## 2017-10-20 MED ORDER — B COMPLEX-C PO TABS
1.0000 | ORAL_TABLET | Freq: Every day | ORAL | Status: DC
  Administered 2017-10-20: 1 via ORAL
  Filled 2017-10-20: qty 1

## 2017-10-20 MED ORDER — DOXYCYCLINE HYCLATE 50 MG PO CAPS: 100.0000 mg | ORAL_CAPSULE | Freq: Two times a day (BID) | ORAL | 0 refills | Status: AC

## 2017-10-20 MED ORDER — TRAMADOL HCL 50 MG PO TABS: 50.0000 mg | ORAL_TABLET | Freq: Four times a day (QID) | ORAL | 0 refills | Status: DC | PRN

## 2017-10-20 MED ORDER — INSULIN GLARGINE 100 UNIT/ML ~~LOC~~ SOLN: 50.0000 [IU] | Freq: Two times a day (BID) | SUBCUTANEOUS | 0 refills | Status: DC

## 2017-10-20 MED ORDER — AMITRIPTYLINE HCL 25 MG PO TABS: 25.0000 mg | ORAL_TABLET | Freq: Every day | ORAL | Status: DC

## 2017-10-20 NOTE — Discharge Summary (Signed)
Physician Discharge Summary  BURNETT SPRAY TDV:[id number] DOB: 02/03/1931 DOA: 10/17/2017  PCP: Josetta Huddle, MD  Admit date: 10/17/2017 Discharge date: 10/20/2017  Admitted From:ALF Disposition: ALF  Recommendations for Outpatient Follow-up:  1. Follow up with PCP in 1-2 weeks 2. Please obtain BMP/CBC in one week   Home Health:yes Equipment/Devices:no Discharge Condition:stable CODE STATUS:DNR Diet recommendation:carb modified  Brief/Interim Summary: 83 y.o.malewith medical history significant ofdiastolic heart failure, stroke, type 2 diabetes, chronic respiratory failure on 2 L O2, peripheral neuropathy, multiple multiple other medical problems who [facility] with shakes and lowerextremityswelling and redness.  #Bilateral lower extremity cellulitis: Cellulitis mildly improving with IV antibiotics. ABI with no occlusive disease.  Patient has chronic venous stasis changes. -Doppler ultrasound negative for DVT in May 2018.  I do not think patient has DVT. -Discharge with oral Augmentin and Doxy for a week.  Recommend to follow-up with PCP.  Pain medication as needed.  #Tremors: The dose of Elavil and gabapentin reduced.  EEG with no seizure-like activity.  Patient has no tremor in the hospital.  #Chronic kidney disease stage III: Serum creatinine level around baseline.  Monitor BMP.   # h/o stroke/PVD: Continue aspirin, Plavix and statin.  # Chronic diastolic congestive heart failure: Euvolemic  # Hypertension: Continue metoprolol.  # Hyperlipidemia: Continue statin  # Type 2 diabetes with peripheral neuropathy: Continue insulin, dose adjusted.  Monitor blood sugar level.  Recommend to follow-up with PCP.  # History of PSVT:   Continue metoprolol  # Chronic respiratory failure with hypoxia: Continue 2 L of oxygen.  Stable.    Discharge Diagnoses:  Principal Problem:   Cellulitis Active Problems:   History of PSVT (paroxysmal  supraventricular tachycardia)   History of CVA (cerebrovascular accident)   Dyslipidemia   Obesity (BMI 30-39.9)   AKI (acute kidney injury) ([place])   Bilateral lower leg cellulitis   Essential hypertension, benign   Depression   DM type 2 with diabetic peripheral neuropathy [facility])   Tremor    Discharge Instructions  Discharge Instructions    Call MD for:  difficulty breathing, headache or visual disturbances   Complete by:  As directed    Call MD for:  extreme fatigue   Complete by:  As directed    Call MD for:  hives   Complete by:  As directed    Call MD for:  persistant dizziness or light-headedness   Complete by:  As directed    Call MD for:  persistant nausea and vomiting   Complete by:  As directed    Call MD for:  severe uncontrolled pain   Complete by:  As directed    Call MD for:  temperature >100.4   Complete by:  As directed    Diet - low sodium heart healthy   Complete by:  As directed    Diet Carb Modified   Complete by:  As directed    Increase activity slowly   Complete by:  As directed      Allergies as of 10/20/2017      Reactions   Ativan [lorazepam] Other (See Comments)   Flailing of extremities noted immediately s/p IV administration.   Flomax [tamsulosin Hcl] Other (See Comments)   Excessive tearing   Glucophage [metformin Hcl] Diarrhea      Medication List    TAKE these medications   amitriptyline 25 MG tablet Commonly known as:  ELAVIL Take 1 tablet (25 mg total) by mouth at bedtime. What changed:  how much to take   amoxicillin-clavulanate 875-125 MG tablet Commonly known as:  AUGMENTIN Take 1 tablet by mouth every 12 (twelve) hours for 7 days.   aspirin EC 81 MG tablet Take 81 mg by mouth daily.   clopidogrel 75 MG tablet Commonly known as:  PLAVIX Take 75 mg by mouth daily.   doxycycline 50 MG capsule Commonly known as:  VIBRAMYCIN Take 2 capsules (100 mg total) by mouth 2 (two) times daily for 7 days.   fluticasone 50  MCG/ACT nasal spray Commonly known as:  FLONASE Place 1 spray into both nostrils daily.   furosemide 80 MG tablet Commonly known as:  LASIX Take 80 mg by mouth 2 (two) times daily.   gabapentin 300 MG capsule Commonly known as:  NEURONTIN Take 1 capsule (300 mg total) by mouth 2 (two) times daily. Hold for sedation. What changed:  when to take this   hydrocerin Crea Apply 1 application topically daily. What changed:    when to take this  reasons to take this   insulin aspart 100 UNIT/ML injection Commonly known as:  novoLOG Inject 4-15 Units into the skin 4 (four) times daily as needed for high blood sugar. Blood sugar < 150 = 0 units 151-200 = 4 units 201-250 = 6 units 251-300 = 8 units 301-350 = 10 units 351-400 = 12 units >400 = 15 units   insulin glargine 100 UNIT/ML injection Commonly known as:  LANTUS Inject 0.5 mLs (50 Units total) into the skin 2 (two) times daily. What changed:  when to take this   metoprolol tartrate 25 MG tablet Commonly known as:  LOPRESSOR Take 1 tablet (25 mg total) by mouth 2 (two) times daily.   pantoprazole 40 MG tablet Commonly known as:  PROTONIX Take 40 mg by mouth daily.   potassium chloride 20 MEQ packet Commonly known as:  KLOR-CON Take 20 mEq by mouth daily.   senna-docusate 8.6-50 MG tablet Commonly known as:  Senokot-S Take 1 tablet by mouth 2 (two) times daily.   simvastatin 20 MG tablet Commonly known as:  ZOCOR Take 20 mg by mouth daily.   terazosin 2 MG capsule Commonly known as:  HYTRIN Take 4 mg by mouth daily. Take two 2-mg tablets to = 4 mg   traMADol 50 MG tablet Commonly known as:  ULTRAM Take 1 tablet (50 mg total) by mouth every 6 (six) hours as needed for moderate pain.   VITAMIN B COMPLEX PO Take 1 tablet by mouth daily.   vitamin B-12 1000 MCG tablet Commonly known as:  CYANOCOBALAMIN Take 1,000 mcg by mouth daily.       Contact information for follow-up providers    Josetta Huddle, MD.  Schedule an appointment as soon as possible for a visit in 1 week(s).   Specialty:  Internal Medicine Contact information: [address] [place] [address] [phone]            Contact information for after-discharge care    [facility] Follow up.   Service:  Assisted Living Contact information: [phone] [place] [place] [address] [phone]                 Allergies  Allergen Reactions  . Ativan [Lorazepam] Other (See Comments)    Flailing of extremities noted immediately s/p IV administration.  [name] Kitchen Flomax [Tamsulosin Hcl] Other (See Comments)    Excessive tearing  . Glucophage [  Metformin Hcl] Diarrhea    Consultations: None  Procedures/Studies: None  Subjective: Seen and examined at bedside.  Denies headache, dizziness, nausea vomiting chest pain shortness of breath.  Discharge Exam: Vitals:   10/19/17 2208 10/20/17 0654  BP: 130/68 129/62  Pulse: 75 76  Resp: 14 18  Temp: (!) 97.5 F (36.4 C) 97.6 F (36.4 C)  SpO2: 100% 99%   Vitals:   10/19/17 0544 10/19/17 1500 10/19/17 2208 10/20/17 0654  BP: 137/83 119/71 130/68 129/62  Pulse: 78 66 75 76  Resp: 20 16 14 18   Temp: 98.2 F (36.8 C) (!) 97.5 F (36.4 C) (!) 97.5 F (36.4 C) 97.6 F (36.4 C)  TempSrc: Oral Oral Oral Oral  SpO2: 98% 100% 100% 99%  Weight:      Height:        General: Pt is alert, awake, not in acute distress Cardiovascular: RRR, S1/S2 +, no rubs, no gallops Respiratory: CTA bilaterally, no wheezing, no rhonchi Abdominal: Soft, NT, ND, bowel sounds + Extremities: Consistent with chronic venous stasis changes. Lower leg redness better than yesterday.  Still has some erythematous change.  The results of significant diagnostics from this hospitalization (including imaging, microbiology, ancillary and laboratory) are listed below for reference.     Microbiology: Recent Results (from the  past 240 hour(s))  Blood culture (routine x 2)     Status: None (Preliminary result)   Collection Time: 10/17/17  6:06 PM  Result Value Ref Range Status   Specimen Description BLOOD BLOOD LEFT HAND  Final   Special Requests   Final    BOTTLES DRAWN AEROBIC AND ANAEROBIC Blood Culture adequate volume   Culture   Final    NO GROWTH 3 DAYS Performed at [facility], [address] [place], [place] [address]    Report Status PENDING  Incomplete  MRSA PCR Screening     Status: Abnormal   Collection Time: 10/17/17  9:14 PM  Result Value Ref Range Status   MRSA by PCR POSITIVE (A) NEGATIVE Final    Comment:        The GeneXpert MRSA Assay (FDA approved for NASAL specimens only), is one component of a comprehensive MRSA colonization surveillance program. It is not intended to diagnose MRSA infection nor to guide or monitor treatment for MRSA infections. RESULT CALLED TO, READ BACK BY AND VERIFIED WITH: Carmela Hurt [id number] @ 2321 BY J SCOTTON   Blood culture (routine x 2)     Status: None (Preliminary result)   Collection Time: 10/17/17  9:24 PM  Result Value Ref Range Status   Specimen Description BLOOD RIGHT ANTECUBITAL  Final   Special Requests IN PEDIATRIC BOTTLE Blood Culture adequate volume  Final   Culture   Final    NO GROWTH 3 DAYS Performed at [facility], [place] [address] [place], [place] [address]    Report Status PENDING  Incomplete     Labs: BNP (last 3 results) Recent Labs    12/05/16 1741 12/09/16 1312 01/01/17 1600  BNP 37.8 119.6* 14.7   Basic Metabolic Panel: Recent Labs  Lab 10/17/17 1430 10/18/17 0524  NA 142 142  K 3.7 3.8  CL 101 102  CO2 36* 34*  GLUCOSE 138* 177*  BUN 16 15  CREATININE 1.65* 1.48*  CALCIUM 9.1 9.1   Liver Function Tests: Recent Labs  Lab 10/17/17 1430 10/18/17 0524  AST 27 29  ALT 26 25  ALKPHOS 85 79  BILITOT 0.9 0.6  PROT 6.9 6.3*  ALBUMIN 3.5 3.3*   No results for input(s): LIPASE, AMYLASE  in the last 168 hours. No results for input(s): AMMONIA in the last 168 hours. CBC: Recent Labs  Lab 10/17/17 1430 10/18/17 0524  WBC 8.2 7.1  NEUTROABS 5.3  --   HGB 11.4* 10.6*  HCT 37.6* 35.3*  MCV 71.1* 71.5*  PLT 204 210   Cardiac Enzymes: No results for input(s): CKTOTAL, CKMB, CKMBINDEX, TROPONINI in the last 168 hours. BNP: Invalid input(s): POCBNP CBG: Recent Labs  Lab 10/19/17 0812 10/19/17 1125 10/19/17 1626 10/19/17 2220 10/20/17 0739  GLUCAP 205* 267* 217* 101* 110*   D-Dimer No results for input(s): DDIMER in the last 72 hours. Hgb A1c No results for input(s): HGBA1C in the last 72 hours. Lipid Profile No results for input(s): CHOL, HDL, LDLCALC, TRIG, CHOLHDL, LDLDIRECT in the last 72 hours. Thyroid function studies Recent Labs    10/17/17 2124  TSH 3.284   Anemia work up No results for input(s): VITAMINB12, FOLATE, FERRITIN, TIBC, IRON, RETICCTPCT in the last 72 hours. Urinalysis    Component Value Date/Time   COLORURINE YELLOW 10/17/2017 1630   APPEARANCEUR CLEAR 10/17/2017 1630   LABSPEC 1.013 10/17/2017 1630   PHURINE 6.0 10/17/2017 1630   GLUCOSEU NEGATIVE 10/17/2017 1630   HGBUR NEGATIVE 10/17/2017 1630   BILIRUBINUR NEGATIVE 10/17/2017 1630   KETONESUR NEGATIVE 10/17/2017 1630   PROTEINUR NEGATIVE 10/17/2017 1630   UROBILINOGEN 1.0 12/06/2014 1414   NITRITE NEGATIVE 10/17/2017 1630   LEUKOCYTESUR NEGATIVE 10/17/2017 1630   Sepsis Labs Invalid input(s): PROCALCITONIN,  WBC,  LACTICIDVEN Microbiology Recent Results (from the past 240 hour(s))  Blood culture (routine x 2)     Status: None (Preliminary result)   Collection Time: 10/17/17  6:06 PM  Result Value Ref Range Status   Specimen Description BLOOD BLOOD LEFT HAND  Final   Special Requests   Final    BOTTLES DRAWN AEROBIC AND ANAEROBIC Blood Culture adequate volume   Culture   Final    NO GROWTH 3 DAYS Performed at [facility], [place] [address] [place], [place]  [address]    Report Status PENDING  Incomplete  MRSA PCR Screening     Status: Abnormal   Collection Time: 10/17/17  9:14 PM  Result Value Ref Range Status   MRSA by PCR POSITIVE (A) NEGATIVE Final    Comment:        The GeneXpert MRSA Assay (FDA approved for NASAL specimens only), is one component of a comprehensive MRSA colonization surveillance program. It is not intended to diagnose MRSA infection nor to guide or monitor treatment for MRSA infections. RESULT CALLED TO, READ BACK BY AND VERIFIED WITH: Carmela Hurt [id number] @ 2321 BY J SCOTTON   Blood culture (routine x 2)     Status: None (Preliminary result)   Collection Time: 10/17/17  9:24 PM  Result Value Ref Range Status   Specimen Description BLOOD RIGHT ANTECUBITAL  Final   Special Requests IN PEDIATRIC BOTTLE Blood Culture adequate volume  Final   Culture   Final    NO GROWTH 3 DAYS Performed at [facility], [place] [address] [place], [place] [address]    Report Status PENDING  Incomplete     Time coordinating discharge:  30 minutes  SIGNED:   Rosita Fire, MD  [facility] 10/20/2017, 11:40 AM  If 7PM-7AM, please contact night-coverage [email or web address] Password [email or web address]

## 2017-10-20 NOTE — Procedures (Signed)
History: 83 year old gentleman being evaluated for tremor  Sedation: None  Technique: This is a 21 channel routine scalp EEG performed at the bedside with bipolar and monopolar montages arranged in accordance to the international 10/20 system of electrode placement. One channel was dedicated to EKG recording.    Background: With maximal wakefulness, the background consists predominantly of theta activity with a posterior dominant rhythm of 7 Hz.  There is irregular delta activity as well throughout most of the recording.  Early sleep is briefly recorded.  No epileptiform discharges are seen  Photic stimulation: Physiologic driving is not performed  EEG Abnormalities: 1) generalized irregular slow activity 2) slow PDR  Clinical Interpretation: This EEG is consistent with a mild generalized non-specific cerebral dysfunction(encephalopathy). There was no seizure or seizure predisposition recorded on this study. Please note that a normal EEG does not preclude the possibility of epilepsy.   Roland Rack, MD [facility] [phone]  If 7pm- 7am, please page neurology on call as listed in [place].

## 2017-10-20 NOTE — Progress Notes (Signed)
Patient returning to [facility]. Facility aware of patient's discharge and staff member Danton Clap confirmed patient's ability to return. CSW sent clinical documents via the HUB. [facility] contacted, patient declined for family to be contacted. Patient's RN can call report to [phone], packet complete. CSW signing off, no other needs identified.  Jared Hall, [place] Social Worker [facility] Cell#: [phone]

## 2017-10-20 NOTE — NC FL2 (Signed)
[place] MEDICAID FL2 LEVEL OF CARE SCREENING TOOL     IDENTIFICATION  Patient Name: Jared Hall Birthdate: 1931-09-21 Sex: male Admission Date (Current Location): 10/17/2017  [facility] and [place] Number:  Herbalist and Address:  [facility],  [place] [address], [place]      Provider Number: [id number]  Attending Physician Name and Address:  Rosita Fire, MD  Relative Name and Phone Number:       Current Level of Care: Hospital Recommended Level of Care: [place] Prior Approval Number:    Date Approved/Denied:   PASRR Number:    Discharge Plan: Other (Comment)(Assisted Living FAcility)    Current Diagnoses: Patient Active Problem List   Diagnosis Date Noted  . Tremor 10/17/2017  . Acute encephalopathy   . Acute ischemic stroke ([place])   . Generalized weakness   . Cerebrovascular disease   . Altered mental status   . Acute respiratory failure with hypercapnia ([place])   . CVA (cerebral vascular accident) ([place]) 01/01/2017  . Dehydration 01/01/2017  . Anemia 01/01/2017  . Acute respiratory failure ([place]) 12/15/2016  . Morbid obesity ([place]) 12/15/2016  . Hard of hearing 12/15/2016  . Chronic kidney disease, stage III (moderate) ([place]) 12/06/2016  . Bell's palsy   . Facial droop   . Paroxysmal atrial fibrillation ([facility])   . Embolic stroke ([place])   . Dysphagia 03/14/2015  . Dysarthria 03/14/2015  . Stroke ([place]) 03/14/2015  . Cellulitis 01/11/2014  . Bilateral lower extremity edema 01/11/2014  . Venous stasis dermatitis 01/11/2014  . DM type 2 with diabetic peripheral neuropathy ([place]) 01/11/2014  . Cellulitis and abscess of leg 01/11/2014  . Cellulitis and abscess 01/11/2014  . Depression 06/15/2013  . GERD (gastroesophageal reflux disease) 06/15/2013  . Constipation 06/15/2013  . Diabetic neuropathy ([place]) 06/15/2013  . Type 1 diabetes mellitus with peripheral circulatory complications ([place]) 09/16/2535  .  Essential hypertension, benign 04/07/2013  . AKI (acute kidney injury) ([place]) 04/04/2013  . Bilateral lower leg cellulitis 04/04/2013  . Right hip pain 04/04/2013  . Renal insufficiency 12/16/2011  . History of CVA (cerebrovascular accident) 12/16/2011  . Dyslipidemia 12/16/2011  . Obesity (BMI 30-39.9) 12/16/2011  . Umbilical hernia with obstruction-partial on CT scan; easily reducible 12/14/2011  . History of PSVT (paroxysmal supraventricular tachycardia) 12/14/2011  . DM (diabetes mellitus) ([place]) 12/14/2011    Orientation RESPIRATION BLADDER Height & Weight     Self, Time, Situation, Place  O2 Incontinent Weight: 270 lb 11.6 oz (122.8 kg) Height:  5\' 10"  (177.8 cm)  BEHAVIORAL SYMPTOMS/MOOD NEUROLOGICAL BOWEL NUTRITION STATUS        Diet(diabetic diet)  AMBULATORY STATUS COMMUNICATION OF NEEDS Skin   Total Care Verbally Normal                       Personal Care Assistance Level of Assistance  Bathing, Feeding, Dressing Bathing Assistance: Maximum assistance Feeding assistance: Independent Dressing Assistance: Maximum assistance     Functional Limitations Info  Sight, Hearing, Speech Sight Info: Impaired Hearing Info: Impaired Speech Info: Adequate    SPECIAL CARE FACTORS FREQUENCY                       Contractures Contractures Info: Not present    Additional Factors Info  Code Status, Allergies, Isolation Precautions, Insulin Sliding Scale Code Status Info: DNR Allergies Info: Ativan Lorazepam;Flomax Tamsulosin Hcl;Glucophage Metformin Hcl;   Insulin Sliding Scale Info: Inject 4-15  Units into the skin 4 (four) times daily as needed for high blood sugar. Blood sugar < 150 = 0 units  151-200 = 4 units 201-250 = 6 units 251-300 = 8 units 301-350 = 10 units 351-400 = 12 units >400 = 15 units Isolation Precautions Info: Contact Precautions  Infection:MRSA       Discharge Medications:  Medication List     TAKE these medications    amitriptyline 25 MG tablet Commonly known as:  ELAVIL Take 1 tablet (25 mg total) by mouth at bedtime. What changed:  how much to take   amoxicillin-clavulanate 875-125 MG tablet Commonly known as:  AUGMENTIN Take 1 tablet by mouth every 12 (twelve) hours for 7 days.   aspirin EC 81 MG tablet Take 81 mg by mouth daily.   clopidogrel 75 MG tablet Commonly known as:  PLAVIX Take 75 mg by mouth daily.   doxycycline 50 MG capsule Commonly known as:  VIBRAMYCIN Take 2 capsules (100 mg total) by mouth 2 (two) times daily for 7 days.   fluticasone 50 MCG/ACT nasal spray Commonly known as:  FLONASE Place 1 spray into both nostrils daily.   furosemide 80 MG tablet Commonly known as:  LASIX Take 80 mg by mouth 2 (two) times daily.   gabapentin 300 MG capsule Commonly known as:  NEURONTIN Take 1 capsule (300 mg total) by mouth 2 (two) times daily. Hold for sedation. What changed:  when to take this   hydrocerin Crea Apply 1 application topically daily. What changed:    when to take this  reasons to take this   insulin aspart 100 UNIT/ML injection Commonly known as:  novoLOG Inject 4-15 Units into the skin 4 (four) times daily as needed for high blood sugar. Blood sugar < 150 = 0 units 151-200 = 4 units 201-250 = 6 units 251-300 = 8 units 301-350 = 10 units 351-400 = 12 units >400 = 15 units   insulin glargine 100 UNIT/ML injection Commonly known as:  LANTUS Inject 0.5 mLs (50 Units total) into the skin 2 (two) times daily. What changed:  when to take this   metoprolol tartrate 25 MG tablet Commonly known as:  LOPRESSOR Take 1 tablet (25 mg total) by mouth 2 (two) times daily.   pantoprazole 40 MG tablet Commonly known as:  PROTONIX Take 40 mg by mouth daily.   potassium chloride 20 MEQ packet Commonly known as:  KLOR-CON Take 20 mEq by mouth daily.   senna-docusate 8.6-50 MG tablet Commonly known as:  Senokot-S Take 1 tablet by mouth 2 (two)  times daily.   simvastatin 20 MG tablet Commonly known as:  ZOCOR Take 20 mg by mouth daily.   terazosin 2 MG capsule Commonly known as:  HYTRIN Take 4 mg by mouth daily. Take two 2-mg tablets to = 4 mg   traMADol 50 MG tablet Commonly known as:  ULTRAM Take 1 tablet (50 mg total) by mouth every 6 (six) hours as needed for moderate pain.   VITAMIN B COMPLEX PO Take 1 tablet by mouth daily.   vitamin B-12 1000 MCG tablet Commonly known as:  CYANOCOBALAMIN Take 1,000 mcg by mouth daily.      Relevant Imaging Results:  Relevant Lab Results:   Additional Information SS#: [id number]  Burnis Medin, LCSW

## 2017-10-22 LAB — CULTURE, BLOOD (ROUTINE X 2)
CULTURE: NO GROWTH
Culture: NO GROWTH
Special Requests: ADEQUATE
Special Requests: ADEQUATE

## 2017-10-26 DIAGNOSIS — R600 Localized edema: Secondary | ICD-10-CM | POA: Diagnosis not present

## 2017-10-26 DIAGNOSIS — E1142 Type 2 diabetes mellitus with diabetic polyneuropathy: Secondary | ICD-10-CM | POA: Diagnosis not present

## 2017-10-26 DIAGNOSIS — M170 Bilateral primary osteoarthritis of knee: Secondary | ICD-10-CM | POA: Diagnosis not present

## 2017-10-26 DIAGNOSIS — E1122 Type 2 diabetes mellitus with diabetic chronic kidney disease: Secondary | ICD-10-CM | POA: Diagnosis not present

## 2017-10-26 DIAGNOSIS — I5030 Unspecified diastolic (congestive) heart failure: Secondary | ICD-10-CM | POA: Diagnosis not present

## 2017-10-26 DIAGNOSIS — I130 Hypertensive heart and chronic kidney disease with heart failure and stage 1 through stage 4 chronic kidney disease, or unspecified chronic kidney disease: Secondary | ICD-10-CM | POA: Diagnosis not present

## 2017-10-30 DIAGNOSIS — H25043 Posterior subcapsular polar age-related cataract, bilateral: Secondary | ICD-10-CM | POA: Diagnosis not present

## 2017-10-31 DIAGNOSIS — I5030 Unspecified diastolic (congestive) heart failure: Secondary | ICD-10-CM | POA: Diagnosis not present

## 2017-10-31 DIAGNOSIS — M170 Bilateral primary osteoarthritis of knee: Secondary | ICD-10-CM | POA: Diagnosis not present

## 2017-10-31 DIAGNOSIS — E1142 Type 2 diabetes mellitus with diabetic polyneuropathy: Secondary | ICD-10-CM | POA: Diagnosis not present

## 2017-10-31 DIAGNOSIS — E1122 Type 2 diabetes mellitus with diabetic chronic kidney disease: Secondary | ICD-10-CM | POA: Diagnosis not present

## 2017-10-31 DIAGNOSIS — R600 Localized edema: Secondary | ICD-10-CM | POA: Diagnosis not present

## 2017-10-31 DIAGNOSIS — I130 Hypertensive heart and chronic kidney disease with heart failure and stage 1 through stage 4 chronic kidney disease, or unspecified chronic kidney disease: Secondary | ICD-10-CM | POA: Diagnosis not present

## 2017-11-01 DIAGNOSIS — M170 Bilateral primary osteoarthritis of knee: Secondary | ICD-10-CM | POA: Diagnosis not present

## 2017-11-01 DIAGNOSIS — I5030 Unspecified diastolic (congestive) heart failure: Secondary | ICD-10-CM | POA: Diagnosis not present

## 2017-11-01 DIAGNOSIS — E1142 Type 2 diabetes mellitus with diabetic polyneuropathy: Secondary | ICD-10-CM | POA: Diagnosis not present

## 2017-11-01 DIAGNOSIS — E1122 Type 2 diabetes mellitus with diabetic chronic kidney disease: Secondary | ICD-10-CM | POA: Diagnosis not present

## 2017-11-01 DIAGNOSIS — R600 Localized edema: Secondary | ICD-10-CM | POA: Diagnosis not present

## 2017-11-01 DIAGNOSIS — I130 Hypertensive heart and chronic kidney disease with heart failure and stage 1 through stage 4 chronic kidney disease, or unspecified chronic kidney disease: Secondary | ICD-10-CM | POA: Diagnosis not present

## 2017-11-02 DIAGNOSIS — M170 Bilateral primary osteoarthritis of knee: Secondary | ICD-10-CM | POA: Diagnosis not present

## 2017-11-02 DIAGNOSIS — I5030 Unspecified diastolic (congestive) heart failure: Secondary | ICD-10-CM | POA: Diagnosis not present

## 2017-11-02 DIAGNOSIS — E1122 Type 2 diabetes mellitus with diabetic chronic kidney disease: Secondary | ICD-10-CM | POA: Diagnosis not present

## 2017-11-02 DIAGNOSIS — R600 Localized edema: Secondary | ICD-10-CM | POA: Diagnosis not present

## 2017-11-02 DIAGNOSIS — I130 Hypertensive heart and chronic kidney disease with heart failure and stage 1 through stage 4 chronic kidney disease, or unspecified chronic kidney disease: Secondary | ICD-10-CM | POA: Diagnosis not present

## 2017-11-02 DIAGNOSIS — E1142 Type 2 diabetes mellitus with diabetic polyneuropathy: Secondary | ICD-10-CM | POA: Diagnosis not present

## 2017-11-05 DIAGNOSIS — R600 Localized edema: Secondary | ICD-10-CM | POA: Diagnosis not present

## 2017-11-05 DIAGNOSIS — M170 Bilateral primary osteoarthritis of knee: Secondary | ICD-10-CM | POA: Diagnosis not present

## 2017-11-05 DIAGNOSIS — E1142 Type 2 diabetes mellitus with diabetic polyneuropathy: Secondary | ICD-10-CM | POA: Diagnosis not present

## 2017-11-05 DIAGNOSIS — E1122 Type 2 diabetes mellitus with diabetic chronic kidney disease: Secondary | ICD-10-CM | POA: Diagnosis not present

## 2017-11-05 DIAGNOSIS — I130 Hypertensive heart and chronic kidney disease with heart failure and stage 1 through stage 4 chronic kidney disease, or unspecified chronic kidney disease: Secondary | ICD-10-CM | POA: Diagnosis not present

## 2017-11-05 DIAGNOSIS — I5030 Unspecified diastolic (congestive) heart failure: Secondary | ICD-10-CM | POA: Diagnosis not present

## 2017-11-06 DIAGNOSIS — N183 Chronic kidney disease, stage 3 (moderate): Secondary | ICD-10-CM | POA: Diagnosis not present

## 2017-11-06 DIAGNOSIS — E1142 Type 2 diabetes mellitus with diabetic polyneuropathy: Secondary | ICD-10-CM | POA: Diagnosis not present

## 2017-11-06 DIAGNOSIS — I130 Hypertensive heart and chronic kidney disease with heart failure and stage 1 through stage 4 chronic kidney disease, or unspecified chronic kidney disease: Secondary | ICD-10-CM | POA: Diagnosis not present

## 2017-11-06 DIAGNOSIS — I5032 Chronic diastolic (congestive) heart failure: Secondary | ICD-10-CM | POA: Diagnosis not present

## 2017-11-06 DIAGNOSIS — L03116 Cellulitis of left lower limb: Secondary | ICD-10-CM | POA: Diagnosis not present

## 2017-11-06 DIAGNOSIS — L03115 Cellulitis of right lower limb: Secondary | ICD-10-CM | POA: Diagnosis not present

## 2017-11-06 DIAGNOSIS — E1122 Type 2 diabetes mellitus with diabetic chronic kidney disease: Secondary | ICD-10-CM | POA: Diagnosis not present

## 2017-11-08 DIAGNOSIS — L03115 Cellulitis of right lower limb: Secondary | ICD-10-CM | POA: Diagnosis not present

## 2017-11-08 DIAGNOSIS — N183 Chronic kidney disease, stage 3 (moderate): Secondary | ICD-10-CM | POA: Diagnosis not present

## 2017-11-08 DIAGNOSIS — E1122 Type 2 diabetes mellitus with diabetic chronic kidney disease: Secondary | ICD-10-CM | POA: Diagnosis not present

## 2017-11-08 DIAGNOSIS — I130 Hypertensive heart and chronic kidney disease with heart failure and stage 1 through stage 4 chronic kidney disease, or unspecified chronic kidney disease: Secondary | ICD-10-CM | POA: Diagnosis not present

## 2017-11-08 DIAGNOSIS — I5032 Chronic diastolic (congestive) heart failure: Secondary | ICD-10-CM | POA: Diagnosis not present

## 2017-11-08 DIAGNOSIS — L03116 Cellulitis of left lower limb: Secondary | ICD-10-CM | POA: Diagnosis not present

## 2017-11-14 DIAGNOSIS — H25811 Combined forms of age-related cataract, right eye: Secondary | ICD-10-CM | POA: Diagnosis not present

## 2017-11-14 DIAGNOSIS — H2511 Age-related nuclear cataract, right eye: Secondary | ICD-10-CM | POA: Diagnosis not present

## 2017-11-14 DIAGNOSIS — H25041 Posterior subcapsular polar age-related cataract, right eye: Secondary | ICD-10-CM | POA: Diagnosis not present

## 2017-11-16 DIAGNOSIS — I5032 Chronic diastolic (congestive) heart failure: Secondary | ICD-10-CM | POA: Diagnosis not present

## 2017-11-16 DIAGNOSIS — L03115 Cellulitis of right lower limb: Secondary | ICD-10-CM | POA: Diagnosis not present

## 2017-11-16 DIAGNOSIS — E1122 Type 2 diabetes mellitus with diabetic chronic kidney disease: Secondary | ICD-10-CM | POA: Diagnosis not present

## 2017-11-16 DIAGNOSIS — L03116 Cellulitis of left lower limb: Secondary | ICD-10-CM | POA: Diagnosis not present

## 2017-11-16 DIAGNOSIS — N183 Chronic kidney disease, stage 3 (moderate): Secondary | ICD-10-CM | POA: Diagnosis not present

## 2017-11-16 DIAGNOSIS — I130 Hypertensive heart and chronic kidney disease with heart failure and stage 1 through stage 4 chronic kidney disease, or unspecified chronic kidney disease: Secondary | ICD-10-CM | POA: Diagnosis not present

## 2017-11-17 IMAGING — CT CT CHEST W/ CM
2 of 4 series · 14 of 36 positions shown, 17 images · IV contrast (iopamidol)
Comparison: Chest radiograph chair [REDACTED]

CLINICAL DATA: Shortness of breath and lower extremity edema

EXAM:
CT CHEST WITH CONTRAST
TECHNIQUE: Multidetector CT imaging of the chest was performed during
intravenous contrast administration.
CONTRAST:  75 mL [id number] IOPAMIDOL ([id number]) INJECTION 61%

[Series 4: super d · axial · 0.88mm/px · z∈[+175,+456]mm · 11 of 317 slices shown, 14 images]
[im 18/317  mediastinal]
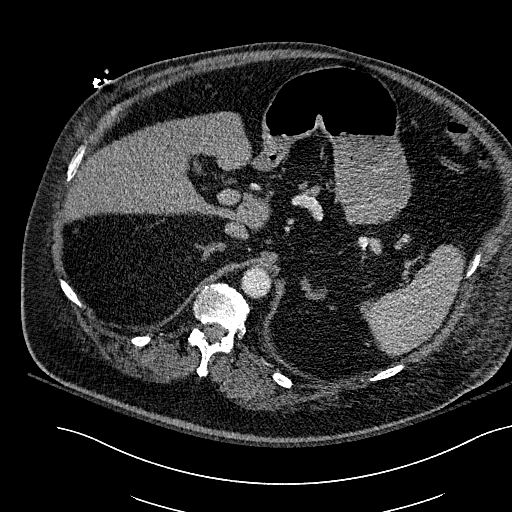
[im 18/317  lung]
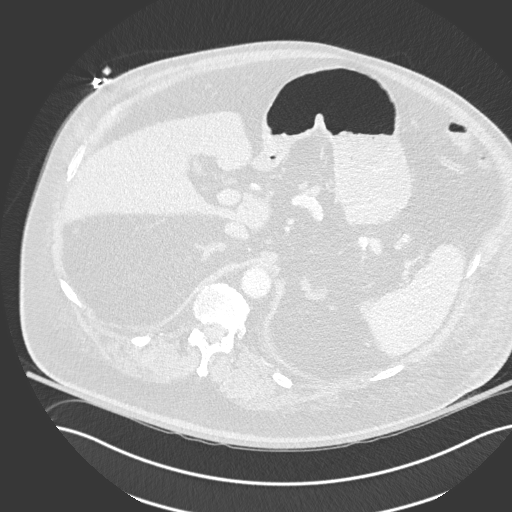
[im 53/317  lung]
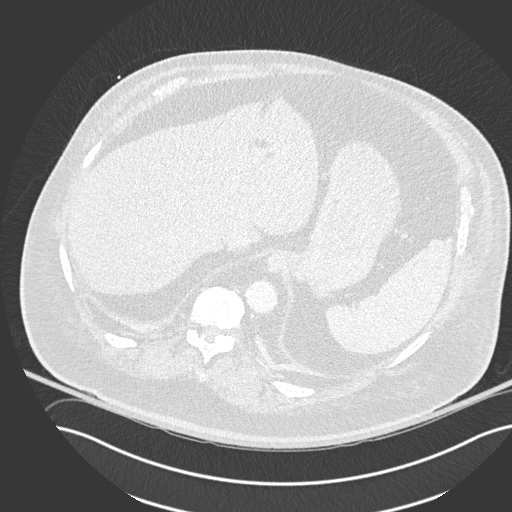
[im 71/317  lung]
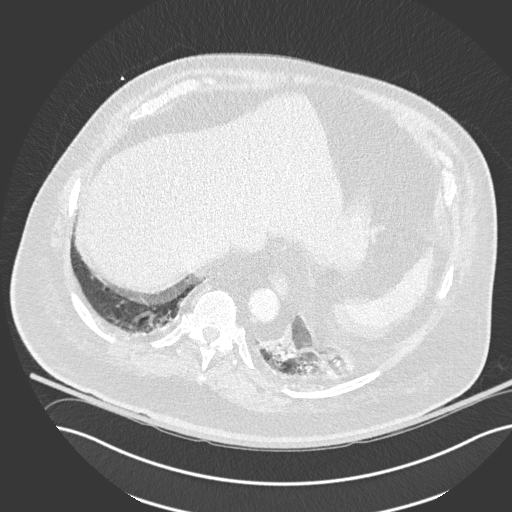
[im 106/317  lung]
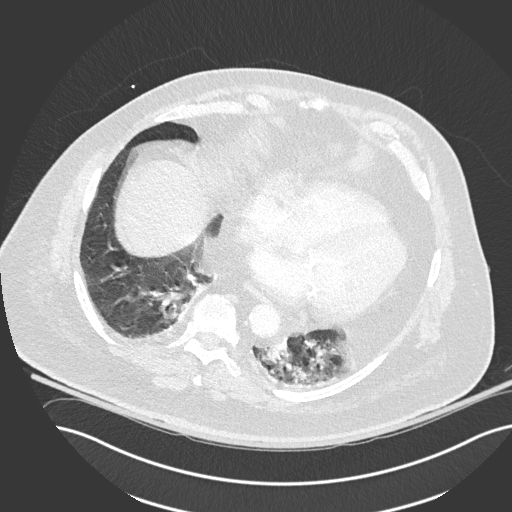
[im 123/317  mediastinal]
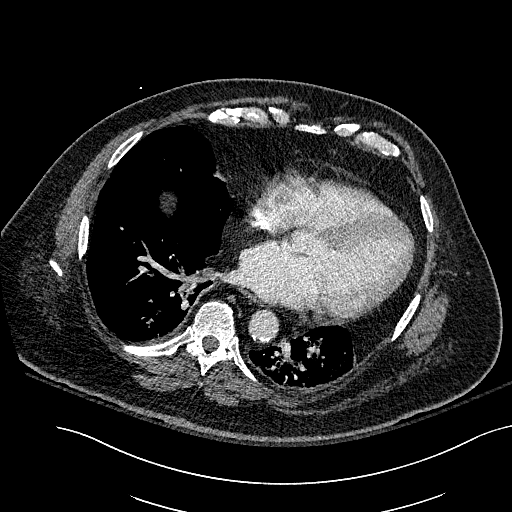
[im 123/317  lung]
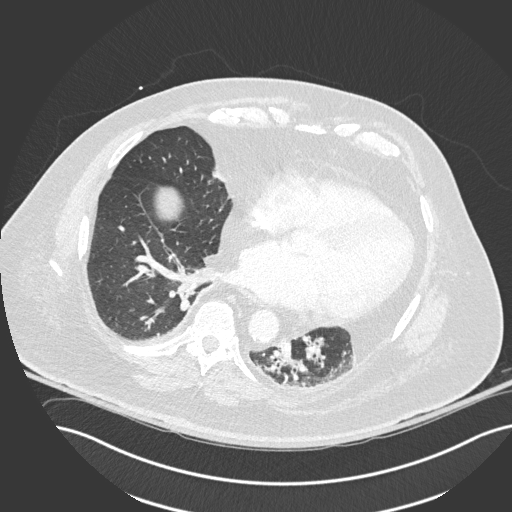
[im 159/317  lung]
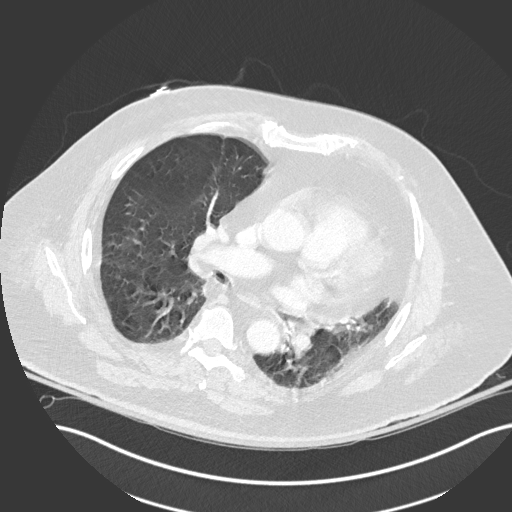
[im 194/317  lung]
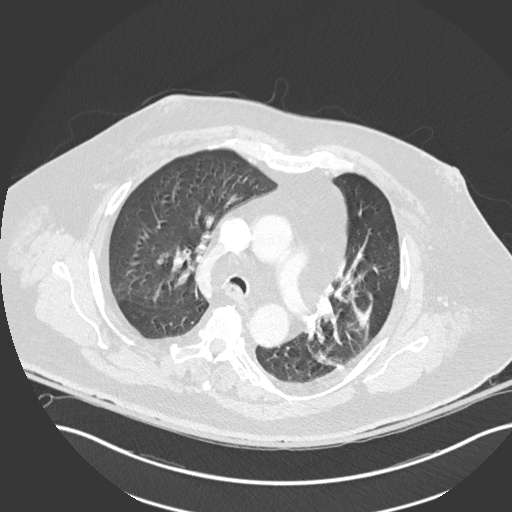
[im 211/317  lung]
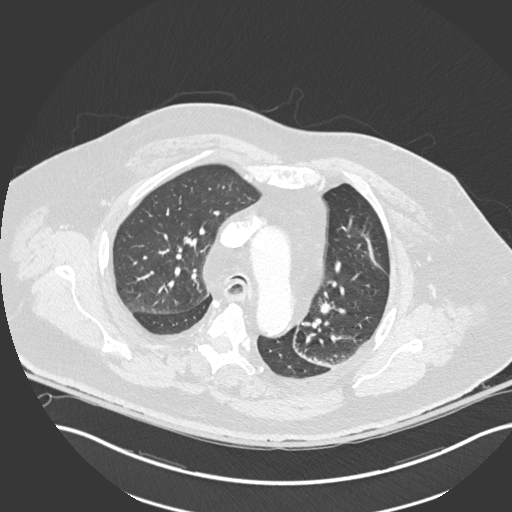
[im 246/317  mediastinal]
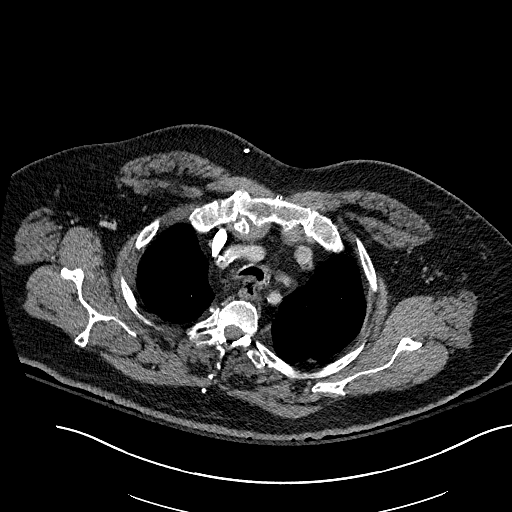
[im 246/317  lung]
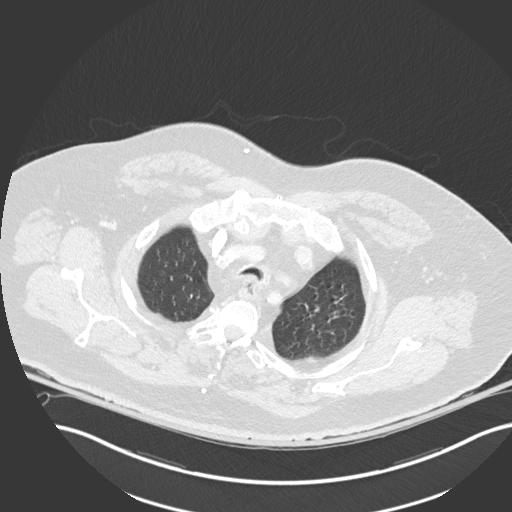
[im 264/317  lung]
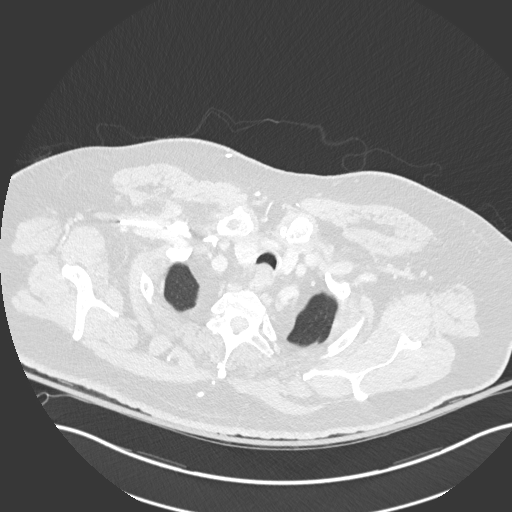
[im 299/317  lung]
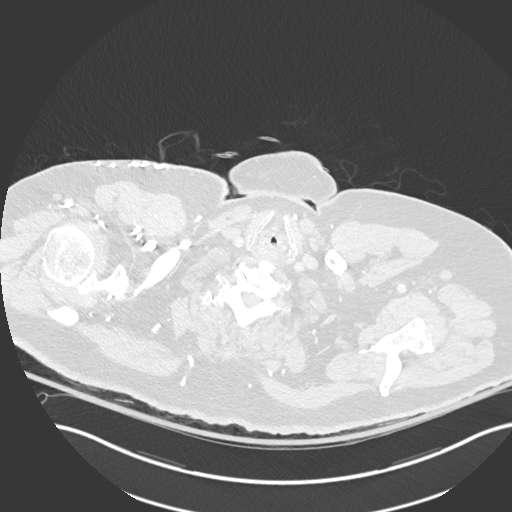

[Series 5: coronal · coronal · 0.54mm/px · 3 of 134 slices shown]
[im 27/134  lung]
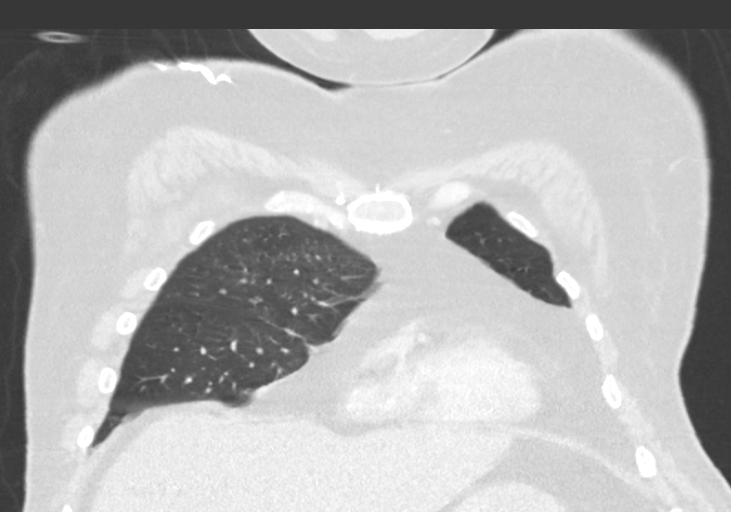
[im 54/134  lung]
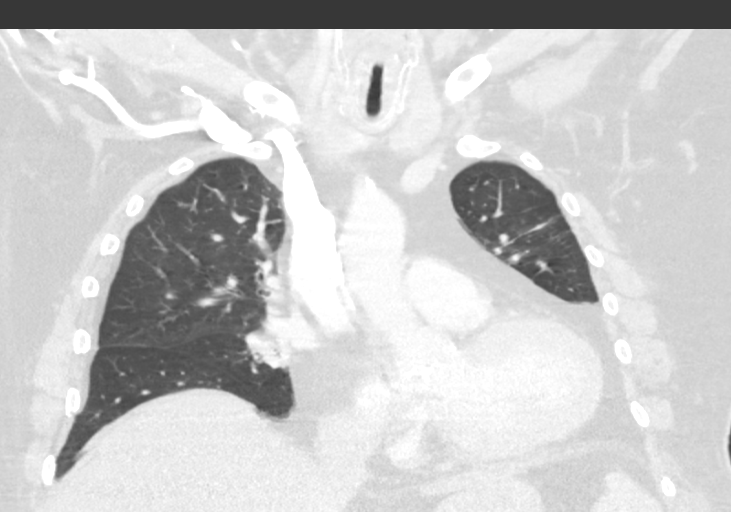
[im 80/134  lung]
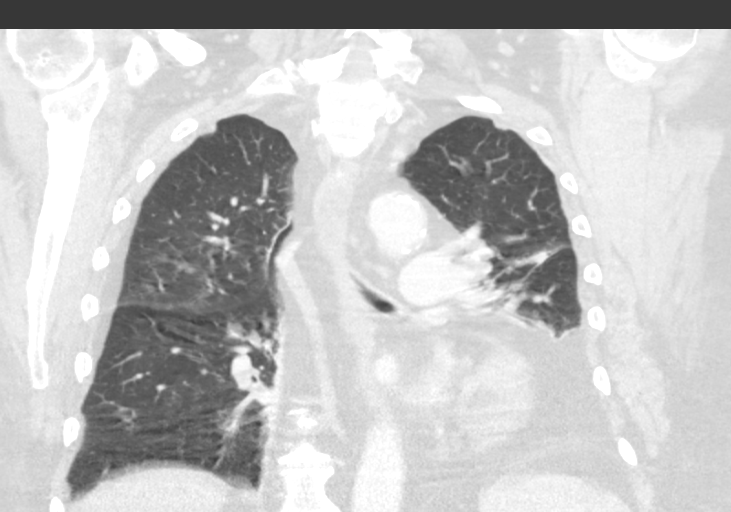

[14 of 36 positions shown; findings below may reference images not displayed]

FINDINGS: Cardiovascular: There is no demonstrable thoracic aortic aneurysm or
dissection. There is moderate atherosclerotic calcification in the
aorta. There are scattered foci of calcification in the proximal
great vessels. There are multiple scattered foci of coronary artery
calcification. Pericardium is not thickened.

Mediastinum/Nodes: Thyroid appears unremarkable. There is fairly
extensive mediastinal fat diffusely. There is no evident thoracic
adenopathy. There is moderate esophageal wall thickening in the
midesophagus.

Lungs/Pleura: There is patchy atelectasis in the posterior segment
left lower lobe as well as in portions of the inferior lingula and
left lower lobe. Similar atelectatic changes noted in the posterior
right base. There is a small area of airspace consolidation in the
posterior left base. There is no appreciable pleural effusion or
pleural thickening.

Upper Abdomen: There are small gallstones within the gallbladder.
The visualized upper abdominal structures otherwise appear
unremarkable.

Musculoskeletal: There is degenerative change in the thoracic spine.
There are no blastic or lytic bone lesions.
IMPRESSION: Areas of patchy atelectasis, primarily on the left, with a small
area of consolidation posterior left base.

No evident adenopathy.

Multiple foci of atherosclerotic calcification including multiple
foci of coronary artery calcification.

Several small gallstones noted in gallbladder.

Thickening of the mid esophageal wall may be indicative of chronic
reflux type esophagitis.

## 2017-11-23 DIAGNOSIS — N183 Chronic kidney disease, stage 3 (moderate): Secondary | ICD-10-CM | POA: Diagnosis not present

## 2017-11-23 DIAGNOSIS — I5032 Chronic diastolic (congestive) heart failure: Secondary | ICD-10-CM | POA: Diagnosis not present

## 2017-11-23 DIAGNOSIS — E1122 Type 2 diabetes mellitus with diabetic chronic kidney disease: Secondary | ICD-10-CM | POA: Diagnosis not present

## 2017-11-23 DIAGNOSIS — L03116 Cellulitis of left lower limb: Secondary | ICD-10-CM | POA: Diagnosis not present

## 2017-11-23 DIAGNOSIS — I130 Hypertensive heart and chronic kidney disease with heart failure and stage 1 through stage 4 chronic kidney disease, or unspecified chronic kidney disease: Secondary | ICD-10-CM | POA: Diagnosis not present

## 2017-11-23 DIAGNOSIS — L03115 Cellulitis of right lower limb: Secondary | ICD-10-CM | POA: Diagnosis not present

## 2017-11-28 DIAGNOSIS — E1122 Type 2 diabetes mellitus with diabetic chronic kidney disease: Secondary | ICD-10-CM | POA: Diagnosis not present

## 2017-11-28 DIAGNOSIS — L03116 Cellulitis of left lower limb: Secondary | ICD-10-CM | POA: Diagnosis not present

## 2017-11-28 DIAGNOSIS — I5032 Chronic diastolic (congestive) heart failure: Secondary | ICD-10-CM | POA: Diagnosis not present

## 2017-11-28 DIAGNOSIS — L03115 Cellulitis of right lower limb: Secondary | ICD-10-CM | POA: Diagnosis not present

## 2017-11-28 DIAGNOSIS — N183 Chronic kidney disease, stage 3 (moderate): Secondary | ICD-10-CM | POA: Diagnosis not present

## 2017-11-28 DIAGNOSIS — I130 Hypertensive heart and chronic kidney disease with heart failure and stage 1 through stage 4 chronic kidney disease, or unspecified chronic kidney disease: Secondary | ICD-10-CM | POA: Diagnosis not present

## 2017-11-30 DIAGNOSIS — E1122 Type 2 diabetes mellitus with diabetic chronic kidney disease: Secondary | ICD-10-CM | POA: Diagnosis not present

## 2017-11-30 DIAGNOSIS — N183 Chronic kidney disease, stage 3 (moderate): Secondary | ICD-10-CM | POA: Diagnosis not present

## 2017-11-30 DIAGNOSIS — I130 Hypertensive heart and chronic kidney disease with heart failure and stage 1 through stage 4 chronic kidney disease, or unspecified chronic kidney disease: Secondary | ICD-10-CM | POA: Diagnosis not present

## 2017-11-30 DIAGNOSIS — L03116 Cellulitis of left lower limb: Secondary | ICD-10-CM | POA: Diagnosis not present

## 2017-11-30 DIAGNOSIS — L03115 Cellulitis of right lower limb: Secondary | ICD-10-CM | POA: Diagnosis not present

## 2017-11-30 DIAGNOSIS — I5032 Chronic diastolic (congestive) heart failure: Secondary | ICD-10-CM | POA: Diagnosis not present

## 2017-12-06 DIAGNOSIS — I5032 Chronic diastolic (congestive) heart failure: Secondary | ICD-10-CM | POA: Diagnosis not present

## 2017-12-06 DIAGNOSIS — I130 Hypertensive heart and chronic kidney disease with heart failure and stage 1 through stage 4 chronic kidney disease, or unspecified chronic kidney disease: Secondary | ICD-10-CM | POA: Diagnosis not present

## 2017-12-06 DIAGNOSIS — L03116 Cellulitis of left lower limb: Secondary | ICD-10-CM | POA: Diagnosis not present

## 2017-12-06 DIAGNOSIS — N183 Chronic kidney disease, stage 3 (moderate): Secondary | ICD-10-CM | POA: Diagnosis not present

## 2017-12-06 DIAGNOSIS — E1122 Type 2 diabetes mellitus with diabetic chronic kidney disease: Secondary | ICD-10-CM | POA: Diagnosis not present

## 2017-12-06 DIAGNOSIS — L03115 Cellulitis of right lower limb: Secondary | ICD-10-CM | POA: Diagnosis not present

## 2017-12-08 DIAGNOSIS — L03116 Cellulitis of left lower limb: Secondary | ICD-10-CM | POA: Diagnosis not present

## 2017-12-08 DIAGNOSIS — E1122 Type 2 diabetes mellitus with diabetic chronic kidney disease: Secondary | ICD-10-CM | POA: Diagnosis not present

## 2017-12-08 DIAGNOSIS — N183 Chronic kidney disease, stage 3 (moderate): Secondary | ICD-10-CM | POA: Diagnosis not present

## 2017-12-08 DIAGNOSIS — I130 Hypertensive heart and chronic kidney disease with heart failure and stage 1 through stage 4 chronic kidney disease, or unspecified chronic kidney disease: Secondary | ICD-10-CM | POA: Diagnosis not present

## 2017-12-08 DIAGNOSIS — L03115 Cellulitis of right lower limb: Secondary | ICD-10-CM | POA: Diagnosis not present

## 2017-12-08 DIAGNOSIS — I5032 Chronic diastolic (congestive) heart failure: Secondary | ICD-10-CM | POA: Diagnosis not present

## 2017-12-10 DIAGNOSIS — L03115 Cellulitis of right lower limb: Secondary | ICD-10-CM | POA: Diagnosis not present

## 2017-12-10 DIAGNOSIS — N183 Chronic kidney disease, stage 3 (moderate): Secondary | ICD-10-CM | POA: Diagnosis not present

## 2017-12-10 DIAGNOSIS — I5032 Chronic diastolic (congestive) heart failure: Secondary | ICD-10-CM | POA: Diagnosis not present

## 2017-12-10 DIAGNOSIS — I130 Hypertensive heart and chronic kidney disease with heart failure and stage 1 through stage 4 chronic kidney disease, or unspecified chronic kidney disease: Secondary | ICD-10-CM | POA: Diagnosis not present

## 2017-12-10 DIAGNOSIS — E1122 Type 2 diabetes mellitus with diabetic chronic kidney disease: Secondary | ICD-10-CM | POA: Diagnosis not present

## 2017-12-10 DIAGNOSIS — L03116 Cellulitis of left lower limb: Secondary | ICD-10-CM | POA: Diagnosis not present

## 2017-12-12 DIAGNOSIS — H2512 Age-related nuclear cataract, left eye: Secondary | ICD-10-CM | POA: Diagnosis not present

## 2017-12-12 DIAGNOSIS — H2511 Age-related nuclear cataract, right eye: Secondary | ICD-10-CM | POA: Diagnosis not present

## 2017-12-12 DIAGNOSIS — H25812 Combined forms of age-related cataract, left eye: Secondary | ICD-10-CM | POA: Diagnosis not present

## 2017-12-13 DIAGNOSIS — I5032 Chronic diastolic (congestive) heart failure: Secondary | ICD-10-CM | POA: Diagnosis not present

## 2017-12-13 DIAGNOSIS — L03116 Cellulitis of left lower limb: Secondary | ICD-10-CM | POA: Diagnosis not present

## 2017-12-13 DIAGNOSIS — L03115 Cellulitis of right lower limb: Secondary | ICD-10-CM | POA: Diagnosis not present

## 2017-12-13 DIAGNOSIS — E1122 Type 2 diabetes mellitus with diabetic chronic kidney disease: Secondary | ICD-10-CM | POA: Diagnosis not present

## 2017-12-13 DIAGNOSIS — N183 Chronic kidney disease, stage 3 (moderate): Secondary | ICD-10-CM | POA: Diagnosis not present

## 2017-12-13 DIAGNOSIS — I130 Hypertensive heart and chronic kidney disease with heart failure and stage 1 through stage 4 chronic kidney disease, or unspecified chronic kidney disease: Secondary | ICD-10-CM | POA: Diagnosis not present

## 2017-12-17 DIAGNOSIS — L03115 Cellulitis of right lower limb: Secondary | ICD-10-CM | POA: Diagnosis not present

## 2017-12-17 DIAGNOSIS — L03116 Cellulitis of left lower limb: Secondary | ICD-10-CM | POA: Diagnosis not present

## 2017-12-17 DIAGNOSIS — I130 Hypertensive heart and chronic kidney disease with heart failure and stage 1 through stage 4 chronic kidney disease, or unspecified chronic kidney disease: Secondary | ICD-10-CM | POA: Diagnosis not present

## 2017-12-17 DIAGNOSIS — E1122 Type 2 diabetes mellitus with diabetic chronic kidney disease: Secondary | ICD-10-CM | POA: Diagnosis not present

## 2017-12-17 DIAGNOSIS — I5032 Chronic diastolic (congestive) heart failure: Secondary | ICD-10-CM | POA: Diagnosis not present

## 2017-12-17 DIAGNOSIS — N183 Chronic kidney disease, stage 3 (moderate): Secondary | ICD-10-CM | POA: Diagnosis not present

## 2017-12-18 DIAGNOSIS — M170 Bilateral primary osteoarthritis of knee: Secondary | ICD-10-CM | POA: Diagnosis not present

## 2017-12-18 DIAGNOSIS — I130 Hypertensive heart and chronic kidney disease with heart failure and stage 1 through stage 4 chronic kidney disease, or unspecified chronic kidney disease: Secondary | ICD-10-CM | POA: Diagnosis not present

## 2017-12-18 DIAGNOSIS — E1142 Type 2 diabetes mellitus with diabetic polyneuropathy: Secondary | ICD-10-CM | POA: Diagnosis not present

## 2017-12-18 DIAGNOSIS — E1122 Type 2 diabetes mellitus with diabetic chronic kidney disease: Secondary | ICD-10-CM | POA: Diagnosis not present

## 2017-12-18 DIAGNOSIS — I5032 Chronic diastolic (congestive) heart failure: Secondary | ICD-10-CM | POA: Diagnosis not present

## 2017-12-18 DIAGNOSIS — R197 Diarrhea, unspecified: Secondary | ICD-10-CM | POA: Diagnosis not present

## 2017-12-18 DIAGNOSIS — Z7984 Long term (current) use of oral hypoglycemic drugs: Secondary | ICD-10-CM | POA: Diagnosis not present

## 2017-12-18 DIAGNOSIS — I8311 Varicose veins of right lower extremity with inflammation: Secondary | ICD-10-CM | POA: Diagnosis not present

## 2017-12-18 DIAGNOSIS — J209 Acute bronchitis, unspecified: Secondary | ICD-10-CM | POA: Diagnosis not present

## 2017-12-20 DIAGNOSIS — L03116 Cellulitis of left lower limb: Secondary | ICD-10-CM | POA: Diagnosis not present

## 2017-12-20 DIAGNOSIS — I5032 Chronic diastolic (congestive) heart failure: Secondary | ICD-10-CM | POA: Diagnosis not present

## 2017-12-20 DIAGNOSIS — I130 Hypertensive heart and chronic kidney disease with heart failure and stage 1 through stage 4 chronic kidney disease, or unspecified chronic kidney disease: Secondary | ICD-10-CM | POA: Diagnosis not present

## 2017-12-20 DIAGNOSIS — N183 Chronic kidney disease, stage 3 (moderate): Secondary | ICD-10-CM | POA: Diagnosis not present

## 2017-12-20 DIAGNOSIS — E1122 Type 2 diabetes mellitus with diabetic chronic kidney disease: Secondary | ICD-10-CM | POA: Diagnosis not present

## 2017-12-20 DIAGNOSIS — L03115 Cellulitis of right lower limb: Secondary | ICD-10-CM | POA: Diagnosis not present

## 2017-12-26 DIAGNOSIS — I5032 Chronic diastolic (congestive) heart failure: Secondary | ICD-10-CM | POA: Diagnosis not present

## 2017-12-26 DIAGNOSIS — E1122 Type 2 diabetes mellitus with diabetic chronic kidney disease: Secondary | ICD-10-CM | POA: Diagnosis not present

## 2017-12-26 DIAGNOSIS — L03116 Cellulitis of left lower limb: Secondary | ICD-10-CM | POA: Diagnosis not present

## 2017-12-26 DIAGNOSIS — N183 Chronic kidney disease, stage 3 (moderate): Secondary | ICD-10-CM | POA: Diagnosis not present

## 2017-12-26 DIAGNOSIS — I130 Hypertensive heart and chronic kidney disease with heart failure and stage 1 through stage 4 chronic kidney disease, or unspecified chronic kidney disease: Secondary | ICD-10-CM | POA: Diagnosis not present

## 2017-12-26 DIAGNOSIS — L03115 Cellulitis of right lower limb: Secondary | ICD-10-CM | POA: Diagnosis not present

## 2018-01-01 DIAGNOSIS — L821 Other seborrheic keratosis: Secondary | ICD-10-CM | POA: Diagnosis not present

## 2018-01-01 DIAGNOSIS — Z85828 Personal history of other malignant neoplasm of skin: Secondary | ICD-10-CM | POA: Diagnosis not present

## 2018-01-02 DIAGNOSIS — I130 Hypertensive heart and chronic kidney disease with heart failure and stage 1 through stage 4 chronic kidney disease, or unspecified chronic kidney disease: Secondary | ICD-10-CM | POA: Diagnosis not present

## 2018-01-02 DIAGNOSIS — N183 Chronic kidney disease, stage 3 (moderate): Secondary | ICD-10-CM | POA: Diagnosis not present

## 2018-01-02 DIAGNOSIS — E1122 Type 2 diabetes mellitus with diabetic chronic kidney disease: Secondary | ICD-10-CM | POA: Diagnosis not present

## 2018-01-02 DIAGNOSIS — I5032 Chronic diastolic (congestive) heart failure: Secondary | ICD-10-CM | POA: Diagnosis not present

## 2018-01-02 DIAGNOSIS — L03115 Cellulitis of right lower limb: Secondary | ICD-10-CM | POA: Diagnosis not present

## 2018-01-02 DIAGNOSIS — L03116 Cellulitis of left lower limb: Secondary | ICD-10-CM | POA: Diagnosis not present

## 2018-01-19 DIAGNOSIS — N183 Chronic kidney disease, stage 3 (moderate): Secondary | ICD-10-CM | POA: Diagnosis not present

## 2018-01-19 DIAGNOSIS — I5032 Chronic diastolic (congestive) heart failure: Secondary | ICD-10-CM | POA: Diagnosis not present

## 2018-01-19 DIAGNOSIS — E1142 Type 2 diabetes mellitus with diabetic polyneuropathy: Secondary | ICD-10-CM | POA: Diagnosis not present

## 2018-01-19 DIAGNOSIS — E1122 Type 2 diabetes mellitus with diabetic chronic kidney disease: Secondary | ICD-10-CM | POA: Diagnosis not present

## 2018-01-19 DIAGNOSIS — I130 Hypertensive heart and chronic kidney disease with heart failure and stage 1 through stage 4 chronic kidney disease, or unspecified chronic kidney disease: Secondary | ICD-10-CM | POA: Diagnosis not present

## 2018-01-19 DIAGNOSIS — M170 Bilateral primary osteoarthritis of knee: Secondary | ICD-10-CM | POA: Diagnosis not present

## 2018-01-23 DIAGNOSIS — N183 Chronic kidney disease, stage 3 (moderate): Secondary | ICD-10-CM | POA: Diagnosis not present

## 2018-01-23 DIAGNOSIS — I5032 Chronic diastolic (congestive) heart failure: Secondary | ICD-10-CM | POA: Diagnosis not present

## 2018-01-23 DIAGNOSIS — M170 Bilateral primary osteoarthritis of knee: Secondary | ICD-10-CM | POA: Diagnosis not present

## 2018-01-23 DIAGNOSIS — E1142 Type 2 diabetes mellitus with diabetic polyneuropathy: Secondary | ICD-10-CM | POA: Diagnosis not present

## 2018-01-23 DIAGNOSIS — E1122 Type 2 diabetes mellitus with diabetic chronic kidney disease: Secondary | ICD-10-CM | POA: Diagnosis not present

## 2018-01-23 DIAGNOSIS — I130 Hypertensive heart and chronic kidney disease with heart failure and stage 1 through stage 4 chronic kidney disease, or unspecified chronic kidney disease: Secondary | ICD-10-CM | POA: Diagnosis not present

## 2018-01-25 DIAGNOSIS — I5032 Chronic diastolic (congestive) heart failure: Secondary | ICD-10-CM | POA: Diagnosis not present

## 2018-01-25 DIAGNOSIS — E1142 Type 2 diabetes mellitus with diabetic polyneuropathy: Secondary | ICD-10-CM | POA: Diagnosis not present

## 2018-01-25 DIAGNOSIS — E1122 Type 2 diabetes mellitus with diabetic chronic kidney disease: Secondary | ICD-10-CM | POA: Diagnosis not present

## 2018-01-25 DIAGNOSIS — N183 Chronic kidney disease, stage 3 (moderate): Secondary | ICD-10-CM | POA: Diagnosis not present

## 2018-01-25 DIAGNOSIS — M170 Bilateral primary osteoarthritis of knee: Secondary | ICD-10-CM | POA: Diagnosis not present

## 2018-01-25 DIAGNOSIS — I130 Hypertensive heart and chronic kidney disease with heart failure and stage 1 through stage 4 chronic kidney disease, or unspecified chronic kidney disease: Secondary | ICD-10-CM | POA: Diagnosis not present

## 2018-01-29 DIAGNOSIS — N183 Chronic kidney disease, stage 3 (moderate): Secondary | ICD-10-CM | POA: Diagnosis not present

## 2018-01-29 DIAGNOSIS — I5032 Chronic diastolic (congestive) heart failure: Secondary | ICD-10-CM | POA: Diagnosis not present

## 2018-01-29 DIAGNOSIS — I130 Hypertensive heart and chronic kidney disease with heart failure and stage 1 through stage 4 chronic kidney disease, or unspecified chronic kidney disease: Secondary | ICD-10-CM | POA: Diagnosis not present

## 2018-01-29 DIAGNOSIS — E1142 Type 2 diabetes mellitus with diabetic polyneuropathy: Secondary | ICD-10-CM | POA: Diagnosis not present

## 2018-01-29 DIAGNOSIS — M170 Bilateral primary osteoarthritis of knee: Secondary | ICD-10-CM | POA: Diagnosis not present

## 2018-01-29 DIAGNOSIS — E1122 Type 2 diabetes mellitus with diabetic chronic kidney disease: Secondary | ICD-10-CM | POA: Diagnosis not present

## 2018-02-01 DIAGNOSIS — I130 Hypertensive heart and chronic kidney disease with heart failure and stage 1 through stage 4 chronic kidney disease, or unspecified chronic kidney disease: Secondary | ICD-10-CM | POA: Diagnosis not present

## 2018-02-01 DIAGNOSIS — E1122 Type 2 diabetes mellitus with diabetic chronic kidney disease: Secondary | ICD-10-CM | POA: Diagnosis not present

## 2018-02-01 DIAGNOSIS — E1142 Type 2 diabetes mellitus with diabetic polyneuropathy: Secondary | ICD-10-CM | POA: Diagnosis not present

## 2018-02-01 DIAGNOSIS — I5032 Chronic diastolic (congestive) heart failure: Secondary | ICD-10-CM | POA: Diagnosis not present

## 2018-02-01 DIAGNOSIS — N183 Chronic kidney disease, stage 3 (moderate): Secondary | ICD-10-CM | POA: Diagnosis not present

## 2018-02-01 DIAGNOSIS — M170 Bilateral primary osteoarthritis of knee: Secondary | ICD-10-CM | POA: Diagnosis not present

## 2018-02-05 DIAGNOSIS — I5032 Chronic diastolic (congestive) heart failure: Secondary | ICD-10-CM | POA: Diagnosis not present

## 2018-02-05 DIAGNOSIS — N183 Chronic kidney disease, stage 3 (moderate): Secondary | ICD-10-CM | POA: Diagnosis not present

## 2018-02-05 DIAGNOSIS — I130 Hypertensive heart and chronic kidney disease with heart failure and stage 1 through stage 4 chronic kidney disease, or unspecified chronic kidney disease: Secondary | ICD-10-CM | POA: Diagnosis not present

## 2018-02-05 DIAGNOSIS — M170 Bilateral primary osteoarthritis of knee: Secondary | ICD-10-CM | POA: Diagnosis not present

## 2018-02-05 DIAGNOSIS — E1142 Type 2 diabetes mellitus with diabetic polyneuropathy: Secondary | ICD-10-CM | POA: Diagnosis not present

## 2018-02-05 DIAGNOSIS — E1122 Type 2 diabetes mellitus with diabetic chronic kidney disease: Secondary | ICD-10-CM | POA: Diagnosis not present

## 2018-02-07 DIAGNOSIS — I130 Hypertensive heart and chronic kidney disease with heart failure and stage 1 through stage 4 chronic kidney disease, or unspecified chronic kidney disease: Secondary | ICD-10-CM | POA: Diagnosis not present

## 2018-02-07 DIAGNOSIS — M170 Bilateral primary osteoarthritis of knee: Secondary | ICD-10-CM | POA: Diagnosis not present

## 2018-02-07 DIAGNOSIS — I5032 Chronic diastolic (congestive) heart failure: Secondary | ICD-10-CM | POA: Diagnosis not present

## 2018-02-07 DIAGNOSIS — E1142 Type 2 diabetes mellitus with diabetic polyneuropathy: Secondary | ICD-10-CM | POA: Diagnosis not present

## 2018-02-07 DIAGNOSIS — N183 Chronic kidney disease, stage 3 (moderate): Secondary | ICD-10-CM | POA: Diagnosis not present

## 2018-02-07 DIAGNOSIS — E1122 Type 2 diabetes mellitus with diabetic chronic kidney disease: Secondary | ICD-10-CM | POA: Diagnosis not present

## 2018-02-14 DIAGNOSIS — H4912 Fourth [trochlear] nerve palsy, left eye: Secondary | ICD-10-CM | POA: Diagnosis not present

## 2018-02-19 DIAGNOSIS — E1142 Type 2 diabetes mellitus with diabetic polyneuropathy: Secondary | ICD-10-CM | POA: Diagnosis not present

## 2018-02-19 DIAGNOSIS — Z8673 Personal history of transient ischemic attack (TIA), and cerebral infarction without residual deficits: Secondary | ICD-10-CM | POA: Diagnosis not present

## 2018-02-19 DIAGNOSIS — Z794 Long term (current) use of insulin: Secondary | ICD-10-CM | POA: Diagnosis not present

## 2018-02-19 DIAGNOSIS — Z9181 History of falling: Secondary | ICD-10-CM | POA: Diagnosis not present

## 2018-02-19 DIAGNOSIS — I4891 Unspecified atrial fibrillation: Secondary | ICD-10-CM | POA: Diagnosis not present

## 2018-02-19 DIAGNOSIS — E1122 Type 2 diabetes mellitus with diabetic chronic kidney disease: Secondary | ICD-10-CM | POA: Diagnosis not present

## 2018-02-19 DIAGNOSIS — N183 Chronic kidney disease, stage 3 (moderate): Secondary | ICD-10-CM | POA: Diagnosis not present

## 2018-02-19 DIAGNOSIS — I5032 Chronic diastolic (congestive) heart failure: Secondary | ICD-10-CM | POA: Diagnosis not present

## 2018-02-19 DIAGNOSIS — I872 Venous insufficiency (chronic) (peripheral): Secondary | ICD-10-CM | POA: Diagnosis not present

## 2018-02-19 DIAGNOSIS — M170 Bilateral primary osteoarthritis of knee: Secondary | ICD-10-CM | POA: Diagnosis not present

## 2018-02-19 DIAGNOSIS — E559 Vitamin D deficiency, unspecified: Secondary | ICD-10-CM | POA: Diagnosis not present

## 2018-02-19 DIAGNOSIS — N400 Enlarged prostate without lower urinary tract symptoms: Secondary | ICD-10-CM | POA: Diagnosis not present

## 2018-02-20 DIAGNOSIS — E1122 Type 2 diabetes mellitus with diabetic chronic kidney disease: Secondary | ICD-10-CM | POA: Diagnosis not present

## 2018-02-20 DIAGNOSIS — I5032 Chronic diastolic (congestive) heart failure: Secondary | ICD-10-CM | POA: Diagnosis not present

## 2018-02-20 DIAGNOSIS — I130 Hypertensive heart and chronic kidney disease with heart failure and stage 1 through stage 4 chronic kidney disease, or unspecified chronic kidney disease: Secondary | ICD-10-CM | POA: Diagnosis not present

## 2018-02-20 DIAGNOSIS — M170 Bilateral primary osteoarthritis of knee: Secondary | ICD-10-CM | POA: Diagnosis not present

## 2018-02-20 DIAGNOSIS — N183 Chronic kidney disease, stage 3 (moderate): Secondary | ICD-10-CM | POA: Diagnosis not present

## 2018-02-20 DIAGNOSIS — E1142 Type 2 diabetes mellitus with diabetic polyneuropathy: Secondary | ICD-10-CM | POA: Diagnosis not present

## 2018-02-23 DIAGNOSIS — N183 Chronic kidney disease, stage 3 (moderate): Secondary | ICD-10-CM | POA: Diagnosis not present

## 2018-02-23 DIAGNOSIS — M170 Bilateral primary osteoarthritis of knee: Secondary | ICD-10-CM | POA: Diagnosis not present

## 2018-02-23 DIAGNOSIS — I5032 Chronic diastolic (congestive) heart failure: Secondary | ICD-10-CM | POA: Diagnosis not present

## 2018-02-23 DIAGNOSIS — E1122 Type 2 diabetes mellitus with diabetic chronic kidney disease: Secondary | ICD-10-CM | POA: Diagnosis not present

## 2018-02-23 DIAGNOSIS — E1142 Type 2 diabetes mellitus with diabetic polyneuropathy: Secondary | ICD-10-CM | POA: Diagnosis not present

## 2018-02-23 DIAGNOSIS — I130 Hypertensive heart and chronic kidney disease with heart failure and stage 1 through stage 4 chronic kidney disease, or unspecified chronic kidney disease: Secondary | ICD-10-CM | POA: Diagnosis not present

## 2018-02-26 DIAGNOSIS — E1142 Type 2 diabetes mellitus with diabetic polyneuropathy: Secondary | ICD-10-CM | POA: Diagnosis not present

## 2018-02-26 DIAGNOSIS — I5032 Chronic diastolic (congestive) heart failure: Secondary | ICD-10-CM | POA: Diagnosis not present

## 2018-02-26 DIAGNOSIS — I130 Hypertensive heart and chronic kidney disease with heart failure and stage 1 through stage 4 chronic kidney disease, or unspecified chronic kidney disease: Secondary | ICD-10-CM | POA: Diagnosis not present

## 2018-02-26 DIAGNOSIS — E1122 Type 2 diabetes mellitus with diabetic chronic kidney disease: Secondary | ICD-10-CM | POA: Diagnosis not present

## 2018-02-26 DIAGNOSIS — N183 Chronic kidney disease, stage 3 (moderate): Secondary | ICD-10-CM | POA: Diagnosis not present

## 2018-02-26 DIAGNOSIS — M170 Bilateral primary osteoarthritis of knee: Secondary | ICD-10-CM | POA: Diagnosis not present

## 2018-03-01 DIAGNOSIS — E1122 Type 2 diabetes mellitus with diabetic chronic kidney disease: Secondary | ICD-10-CM | POA: Diagnosis not present

## 2018-03-01 DIAGNOSIS — I5032 Chronic diastolic (congestive) heart failure: Secondary | ICD-10-CM | POA: Diagnosis not present

## 2018-03-01 DIAGNOSIS — M170 Bilateral primary osteoarthritis of knee: Secondary | ICD-10-CM | POA: Diagnosis not present

## 2018-03-01 DIAGNOSIS — N183 Chronic kidney disease, stage 3 (moderate): Secondary | ICD-10-CM | POA: Diagnosis not present

## 2018-03-01 DIAGNOSIS — E1142 Type 2 diabetes mellitus with diabetic polyneuropathy: Secondary | ICD-10-CM | POA: Diagnosis not present

## 2018-03-01 DIAGNOSIS — I130 Hypertensive heart and chronic kidney disease with heart failure and stage 1 through stage 4 chronic kidney disease, or unspecified chronic kidney disease: Secondary | ICD-10-CM | POA: Diagnosis not present

## 2018-03-07 DIAGNOSIS — M170 Bilateral primary osteoarthritis of knee: Secondary | ICD-10-CM | POA: Diagnosis not present

## 2018-03-07 DIAGNOSIS — I5032 Chronic diastolic (congestive) heart failure: Secondary | ICD-10-CM | POA: Diagnosis not present

## 2018-03-07 DIAGNOSIS — H1010 Acute atopic conjunctivitis, unspecified eye: Secondary | ICD-10-CM | POA: Diagnosis not present

## 2018-03-07 DIAGNOSIS — E1142 Type 2 diabetes mellitus with diabetic polyneuropathy: Secondary | ICD-10-CM | POA: Diagnosis not present

## 2018-03-07 DIAGNOSIS — I8311 Varicose veins of right lower extremity with inflammation: Secondary | ICD-10-CM | POA: Diagnosis not present

## 2018-03-07 DIAGNOSIS — Z794 Long term (current) use of insulin: Secondary | ICD-10-CM | POA: Diagnosis not present

## 2018-03-07 DIAGNOSIS — E1122 Type 2 diabetes mellitus with diabetic chronic kidney disease: Secondary | ICD-10-CM | POA: Diagnosis not present

## 2018-03-07 DIAGNOSIS — I130 Hypertensive heart and chronic kidney disease with heart failure and stage 1 through stage 4 chronic kidney disease, or unspecified chronic kidney disease: Secondary | ICD-10-CM | POA: Diagnosis not present

## 2018-03-07 DIAGNOSIS — N183 Chronic kidney disease, stage 3 (moderate): Secondary | ICD-10-CM | POA: Diagnosis not present

## 2018-03-21 DIAGNOSIS — H4911 Fourth [trochlear] nerve palsy, right eye: Secondary | ICD-10-CM | POA: Diagnosis not present

## 2018-04-02 DIAGNOSIS — Z794 Long term (current) use of insulin: Secondary | ICD-10-CM | POA: Diagnosis not present

## 2018-04-02 DIAGNOSIS — R350 Frequency of micturition: Secondary | ICD-10-CM | POA: Diagnosis not present

## 2018-04-02 DIAGNOSIS — H6123 Impacted cerumen, bilateral: Secondary | ICD-10-CM | POA: Diagnosis not present

## 2018-04-02 DIAGNOSIS — E1142 Type 2 diabetes mellitus with diabetic polyneuropathy: Secondary | ICD-10-CM | POA: Diagnosis not present

## 2018-04-02 DIAGNOSIS — R7309 Other abnormal glucose: Secondary | ICD-10-CM | POA: Diagnosis not present

## 2018-04-02 DIAGNOSIS — E1165 Type 2 diabetes mellitus with hyperglycemia: Secondary | ICD-10-CM | POA: Diagnosis not present

## 2018-04-02 DIAGNOSIS — F322 Major depressive disorder, single episode, severe without psychotic features: Secondary | ICD-10-CM | POA: Diagnosis not present

## 2018-04-23 DIAGNOSIS — H4911 Fourth [trochlear] nerve palsy, right eye: Secondary | ICD-10-CM | POA: Diagnosis not present

## 2018-04-30 DIAGNOSIS — E1165 Type 2 diabetes mellitus with hyperglycemia: Secondary | ICD-10-CM | POA: Diagnosis not present

## 2018-04-30 DIAGNOSIS — E1142 Type 2 diabetes mellitus with diabetic polyneuropathy: Secondary | ICD-10-CM | POA: Diagnosis not present

## 2018-04-30 DIAGNOSIS — M778 Other enthesopathies, not elsewhere classified: Secondary | ICD-10-CM | POA: Diagnosis not present

## 2018-04-30 DIAGNOSIS — Z794 Long term (current) use of insulin: Secondary | ICD-10-CM | POA: Diagnosis not present

## 2018-04-30 DIAGNOSIS — R350 Frequency of micturition: Secondary | ICD-10-CM | POA: Diagnosis not present

## 2018-04-30 DIAGNOSIS — F322 Major depressive disorder, single episode, severe without psychotic features: Secondary | ICD-10-CM | POA: Diagnosis not present

## 2018-05-16 DIAGNOSIS — T1511XA Foreign body in conjunctival sac, right eye, initial encounter: Secondary | ICD-10-CM | POA: Diagnosis not present

## 2018-05-22 ENCOUNTER — Other Ambulatory Visit: Payer: Self-pay

## 2018-05-22 ENCOUNTER — Emergency Department (HOSPITAL_COMMUNITY): Payer: Medicare Other

## 2018-05-22 ENCOUNTER — Encounter (HOSPITAL_COMMUNITY): Payer: Self-pay

## 2018-05-22 ENCOUNTER — Inpatient Hospital Stay (HOSPITAL_COMMUNITY)
Admission: EM | Admit: 2018-05-22 | Discharge: 2018-05-24 | DRG: 069 | Disposition: A | Discharge status: Home Health | Payer: Medicare Other | Attending: Internal Medicine | Admitting: Internal Medicine

## 2018-05-22 DIAGNOSIS — L03115 Cellulitis of right lower limb: Secondary | ICD-10-CM | POA: Diagnosis present

## 2018-05-22 DIAGNOSIS — M2550 Pain in unspecified joint: Secondary | ICD-10-CM | POA: Diagnosis not present

## 2018-05-22 DIAGNOSIS — Z794 Long term (current) use of insulin: Secondary | ICD-10-CM

## 2018-05-22 DIAGNOSIS — Z7982 Long term (current) use of aspirin: Secondary | ICD-10-CM

## 2018-05-22 DIAGNOSIS — R609 Edema, unspecified: Secondary | ICD-10-CM | POA: Diagnosis not present

## 2018-05-22 DIAGNOSIS — R200 Anesthesia of skin: Secondary | ICD-10-CM

## 2018-05-22 DIAGNOSIS — I480 Paroxysmal atrial fibrillation: Secondary | ICD-10-CM | POA: Diagnosis present

## 2018-05-22 DIAGNOSIS — R29702 NIHSS score 2: Secondary | ICD-10-CM | POA: Diagnosis present

## 2018-05-22 DIAGNOSIS — Z7951 Long term (current) use of inhaled steroids: Secondary | ICD-10-CM

## 2018-05-22 DIAGNOSIS — Z7401 Bed confinement status: Secondary | ICD-10-CM | POA: Diagnosis not present

## 2018-05-22 DIAGNOSIS — R4182 Altered mental status, unspecified: Secondary | ICD-10-CM

## 2018-05-22 DIAGNOSIS — I5032 Chronic diastolic (congestive) heart failure: Secondary | ICD-10-CM | POA: Diagnosis present

## 2018-05-22 DIAGNOSIS — Z66 Do not resuscitate: Secondary | ICD-10-CM | POA: Diagnosis present

## 2018-05-22 DIAGNOSIS — H9190 Unspecified hearing loss, unspecified ear: Secondary | ICD-10-CM | POA: Diagnosis not present

## 2018-05-22 DIAGNOSIS — R471 Dysarthria and anarthria: Secondary | ICD-10-CM | POA: Diagnosis present

## 2018-05-22 DIAGNOSIS — I10 Essential (primary) hypertension: Secondary | ICD-10-CM | POA: Diagnosis present

## 2018-05-22 DIAGNOSIS — L03116 Cellulitis of left lower limb: Secondary | ICD-10-CM | POA: Diagnosis present

## 2018-05-22 DIAGNOSIS — N183 Chronic kidney disease, stage 3 unspecified: Secondary | ICD-10-CM | POA: Diagnosis present

## 2018-05-22 DIAGNOSIS — N179 Acute kidney failure, unspecified: Secondary | ICD-10-CM | POA: Diagnosis not present

## 2018-05-22 DIAGNOSIS — E7800 Pure hypercholesterolemia, unspecified: Secondary | ICD-10-CM | POA: Diagnosis present

## 2018-05-22 DIAGNOSIS — I130 Hypertensive heart and chronic kidney disease with heart failure and stage 1 through stage 4 chronic kidney disease, or unspecified chronic kidney disease: Secondary | ICD-10-CM | POA: Diagnosis present

## 2018-05-22 DIAGNOSIS — E1142 Type 2 diabetes mellitus with diabetic polyneuropathy: Secondary | ICD-10-CM | POA: Diagnosis present

## 2018-05-22 DIAGNOSIS — G9341 Metabolic encephalopathy: Secondary | ICD-10-CM | POA: Diagnosis present

## 2018-05-22 DIAGNOSIS — E785 Hyperlipidemia, unspecified: Secondary | ICD-10-CM | POA: Diagnosis present

## 2018-05-22 DIAGNOSIS — Z8673 Personal history of transient ischemic attack (TIA), and cerebral infarction without residual deficits: Secondary | ICD-10-CM

## 2018-05-22 DIAGNOSIS — K219 Gastro-esophageal reflux disease without esophagitis: Secondary | ICD-10-CM | POA: Diagnosis present

## 2018-05-22 DIAGNOSIS — I872 Venous insufficiency (chronic) (peripheral): Secondary | ICD-10-CM | POA: Diagnosis present

## 2018-05-22 DIAGNOSIS — Z8249 Family history of ischemic heart disease and other diseases of the circulatory system: Secondary | ICD-10-CM

## 2018-05-22 DIAGNOSIS — Z888 Allergy status to other drugs, medicaments and biological substances status: Secondary | ICD-10-CM

## 2018-05-22 DIAGNOSIS — G459 Transient cerebral ischemic attack, unspecified: Secondary | ICD-10-CM | POA: Diagnosis not present

## 2018-05-22 DIAGNOSIS — N401 Enlarged prostate with lower urinary tract symptoms: Secondary | ICD-10-CM | POA: Diagnosis present

## 2018-05-22 DIAGNOSIS — L0390 Cellulitis, unspecified: Secondary | ICD-10-CM | POA: Diagnosis not present

## 2018-05-22 DIAGNOSIS — Z923 Personal history of irradiation: Secondary | ICD-10-CM | POA: Diagnosis not present

## 2018-05-22 DIAGNOSIS — Z8546 Personal history of malignant neoplasm of prostate: Secondary | ICD-10-CM

## 2018-05-22 DIAGNOSIS — E1122 Type 2 diabetes mellitus with diabetic chronic kidney disease: Secondary | ICD-10-CM | POA: Diagnosis present

## 2018-05-22 DIAGNOSIS — L03119 Cellulitis of unspecified part of limb: Secondary | ICD-10-CM

## 2018-05-22 DIAGNOSIS — J9611 Chronic respiratory failure with hypoxia: Secondary | ICD-10-CM | POA: Diagnosis present

## 2018-05-22 DIAGNOSIS — D560 Alpha thalassemia: Secondary | ICD-10-CM | POA: Diagnosis present

## 2018-05-22 DIAGNOSIS — R202 Paresthesia of skin: Secondary | ICD-10-CM

## 2018-05-22 DIAGNOSIS — Z7902 Long term (current) use of antithrombotics/antiplatelets: Secondary | ICD-10-CM

## 2018-05-22 DIAGNOSIS — J9811 Atelectasis: Secondary | ICD-10-CM | POA: Diagnosis not present

## 2018-05-22 DIAGNOSIS — E1149 Type 2 diabetes mellitus with other diabetic neurological complication: Secondary | ICD-10-CM | POA: Diagnosis not present

## 2018-05-22 DIAGNOSIS — D649 Anemia, unspecified: Secondary | ICD-10-CM | POA: Diagnosis present

## 2018-05-22 DIAGNOSIS — G4733 Obstructive sleep apnea (adult) (pediatric): Secondary | ICD-10-CM | POA: Diagnosis present

## 2018-05-22 DIAGNOSIS — E11628 Type 2 diabetes mellitus with other skin complications: Secondary | ICD-10-CM

## 2018-05-22 DIAGNOSIS — E1140 Type 2 diabetes mellitus with diabetic neuropathy, unspecified: Secondary | ICD-10-CM | POA: Diagnosis present

## 2018-05-22 DIAGNOSIS — Z9981 Dependence on supplemental oxygen: Secondary | ICD-10-CM

## 2018-05-22 DIAGNOSIS — I878 Other specified disorders of veins: Secondary | ICD-10-CM | POA: Diagnosis present

## 2018-05-22 LAB — URINALYSIS, ROUTINE W REFLEX MICROSCOPIC
Bacteria, UA: NONE SEEN
Bilirubin Urine: NEGATIVE
Glucose, UA: NEGATIVE mg/dL
Ketones, ur: NEGATIVE mg/dL
Nitrite: NEGATIVE
Protein, ur: NEGATIVE mg/dL
Specific Gravity, Urine: 1.009 (ref 1.005–1.030)
pH: 6 (ref 5.0–8.0)

## 2018-05-22 LAB — BLOOD GAS, ARTERIAL
Acid-Base Excess: 8.4 mmol/L — ABNORMAL HIGH (ref 0.0–2.0)
Bicarbonate: 33.7 mmol/L — ABNORMAL HIGH (ref 20.0–28.0)
Drawn by: 518061
FIO2: 36
O2 Content: 4 L/min
O2 Saturation: 96.2 %
Patient temperature: 98.6
pCO2 arterial: 59.2 mmHg — ABNORMAL HIGH (ref 32.0–48.0)
pH, Arterial: 7.374 (ref 7.350–7.450)
pO2, Arterial: 87.2 mmHg (ref 83.0–108.0)

## 2018-05-22 LAB — DIFFERENTIAL
Abs Immature Granulocytes: 0 10*3/uL (ref 0.0–0.1)
Basophils Absolute: 0 10*3/uL (ref 0.0–0.1)
Basophils Relative: 0 %
EOS ABS: 0.4 10*3/uL (ref 0.0–0.7)
EOS PCT: 5 %
IMMATURE GRANULOCYTES: 0 %
LYMPHS PCT: 19 %
Lymphs Abs: 1.5 10*3/uL (ref 0.7–4.0)
MONOS PCT: 14 %
Monocytes Absolute: 1.1 10*3/uL — ABNORMAL HIGH (ref 0.1–1.0)
Neutro Abs: 4.9 10*3/uL (ref 1.7–7.7)
Neutrophils Relative %: 62 %

## 2018-05-22 LAB — CBC
HEMATOCRIT: 35.9 % — AB (ref 39.0–52.0)
Hemoglobin: 10.4 g/dL — ABNORMAL LOW (ref 13.0–17.0)
MCH: 20.8 pg — ABNORMAL LOW (ref 26.0–34.0)
MCHC: 29 g/dL — ABNORMAL LOW (ref 30.0–36.0)
MCV: 71.7 fL — AB (ref 78.0–100.0)
PLATELETS: 200 10*3/uL (ref 150–400)
RBC: 5.01 MIL/uL (ref 4.22–5.81)
RDW: 14.9 % (ref 11.5–15.5)
WBC: 7.8 10*3/uL (ref 4.0–10.5)

## 2018-05-22 LAB — I-STAT CHEM 8, ED
BUN: 16 mg/dL (ref 8–23)
CHLORIDE: 93 mmol/L — AB (ref 98–111)
Calcium, Ion: 1.2 mmol/L (ref 1.15–1.40)
Creatinine, Ser: 1.8 mg/dL — ABNORMAL HIGH (ref 0.61–1.24)
GLUCOSE: 70 mg/dL (ref 70–99)
HEMATOCRIT: 35 % — AB (ref 39.0–52.0)
Hemoglobin: 11.9 g/dL — ABNORMAL LOW (ref 13.0–17.0)
POTASSIUM: 3.7 mmol/L (ref 3.5–5.1)
SODIUM: 139 mmol/L (ref 135–145)
TCO2: 33 mmol/L — ABNORMAL HIGH (ref 22–32)

## 2018-05-22 LAB — COMPREHENSIVE METABOLIC PANEL
ALBUMIN: 3.1 g/dL — AB (ref 3.5–5.0)
ALT: 22 U/L (ref 0–44)
AST: 31 U/L (ref 15–41)
Alkaline Phosphatase: 63 U/L (ref 38–126)
Anion gap: 8 (ref 5–15)
BUN: 13 mg/dL (ref 8–23)
CHLORIDE: 98 mmol/L (ref 98–111)
CO2: 35 mmol/L — AB (ref 22–32)
CREATININE: 1.79 mg/dL — AB (ref 0.61–1.24)
Calcium: 9.4 mg/dL (ref 8.9–10.3)
GFR calc non Af Amer: 33 mL/min — ABNORMAL LOW (ref >60)
GFR, EST AFRICAN AMERICAN: 38 mL/min — AB (ref >60)
Glucose, Bld: 72 mg/dL (ref 70–99)
POTASSIUM: 3.7 mmol/L (ref 3.5–5.1)
Sodium: 141 mmol/L (ref 135–145)
Total Bilirubin: 0.9 mg/dL (ref 0.3–1.2)
Total Protein: 6.4 g/dL — ABNORMAL LOW (ref 6.5–8.1)

## 2018-05-22 LAB — PROTIME-INR
INR: 1.06
PROTHROMBIN TIME: 13.7 s (ref 11.4–15.2)

## 2018-05-22 LAB — RAPID URINE DRUG SCREEN, HOSP PERFORMED
Amphetamines: NOT DETECTED
Benzodiazepines: NOT DETECTED
Cocaine: NOT DETECTED
Opiates: POSITIVE — AB
Tetrahydrocannabinol: NOT DETECTED

## 2018-05-22 LAB — BRAIN NATRIURETIC PEPTIDE: B Natriuretic Peptide: 39 pg/mL (ref 0.0–100.0)

## 2018-05-22 LAB — GLUCOSE, CAPILLARY
Glucose-Capillary: 52 mg/dL — ABNORMAL LOW (ref 70–99)
Glucose-Capillary: 126 mg/dL — ABNORMAL HIGH (ref 70–99)

## 2018-05-22 LAB — TROPONIN I: Troponin I: <0.03 ng/mL (ref <0.03)

## 2018-05-22 LAB — I-STAT TROPONIN, ED: Troponin i, poc: 0.02 ng/mL (ref 0.00–0.08)

## 2018-05-22 LAB — I-STAT VENOUS BLOOD GAS, ED
Acid-Base Excess: 9 mmol/L — ABNORMAL HIGH (ref 0.0–2.0)
Bicarbonate: 36.8 mmol/L — ABNORMAL HIGH (ref 20.0–28.0)
O2 Saturation: 83 %
PH VEN: 7.349 (ref 7.250–7.430)
TCO2: 39 mmol/L — ABNORMAL HIGH (ref 22–32)
pCO2, Ven: 66.8 mmHg — ABNORMAL HIGH (ref 44.0–60.0)
pO2, Ven: 53 mmHg — ABNORMAL HIGH (ref 32.0–45.0)

## 2018-05-22 LAB — APTT: aPTT: 34 seconds (ref 24–36)

## 2018-05-22 LAB — HEMOGLOBIN A1C
Hgb A1c MFr Bld: 10.2 % — ABNORMAL HIGH (ref 4.8–5.6)
Mean Plasma Glucose: 246.04 mg/dL

## 2018-05-22 MED ORDER — SIMVASTATIN 20 MG PO TABS
20.0000 mg | ORAL_TABLET | Freq: Every day | ORAL | Status: DC
  Administered 2018-05-22 – 2018-05-24 (×3): 20 mg via ORAL
  Filled 2018-05-22 (×3): qty 1

## 2018-05-22 MED ORDER — CLINDAMYCIN HCL 300 MG PO CAPS
300.0000 mg | ORAL_CAPSULE | Freq: Four times a day (QID) | ORAL | Status: DC
  Administered 2018-05-22 – 2018-05-23 (×3): 300 mg via ORAL
  Filled 2018-05-22 (×4): qty 1

## 2018-05-22 MED ORDER — STROKE: EARLY STAGES OF RECOVERY BOOK
Freq: Once | Status: AC
  Administered 2018-05-22: 19:00:00
  Filled 2018-05-22: qty 1

## 2018-05-22 MED ORDER — PANTOPRAZOLE SODIUM 40 MG PO TBEC
40.0000 mg | DELAYED_RELEASE_TABLET | Freq: Every day | ORAL | Status: DC
  Administered 2018-05-23 – 2018-05-24 (×2): 40 mg via ORAL
  Filled 2018-05-22 (×2): qty 1

## 2018-05-22 MED ORDER — ASPIRIN EC 81 MG PO TBEC
81.0000 mg | DELAYED_RELEASE_TABLET | Freq: Every day | ORAL | Status: DC
  Administered 2018-05-23 – 2018-05-24 (×2): 81 mg via ORAL
  Filled 2018-05-22 (×2): qty 1

## 2018-05-22 MED ORDER — INSULIN GLARGINE 100 UNIT/ML ~~LOC~~ SOLN
70.0000 [IU] | Freq: Two times a day (BID) | SUBCUTANEOUS | Status: DC
  Administered 2018-05-23 – 2018-05-24 (×3): 70 [IU] via SUBCUTANEOUS
  Filled 2018-05-22 (×5): qty 0.7

## 2018-05-22 MED ORDER — INSULIN ASPART 100 UNIT/ML ~~LOC~~ SOLN
0.0000 [IU] | Freq: Three times a day (TID) | SUBCUTANEOUS | Status: DC
  Administered 2018-05-23 (×2): 3 [IU] via SUBCUTANEOUS
  Administered 2018-05-24 (×2): 4 [IU] via SUBCUTANEOUS
  Administered 2018-05-24: 3 [IU] via SUBCUTANEOUS

## 2018-05-22 MED ORDER — POLYVINYL ALCOHOL 1.4 % OP SOLN
1.0000 [drp] | Freq: Three times a day (TID) | OPHTHALMIC | Status: DC | PRN
  Filled 2018-05-22: qty 15

## 2018-05-22 MED ORDER — ACETAMINOPHEN 650 MG RE SUPP: 650.0000 mg | RECTAL | Status: DC | PRN

## 2018-05-22 MED ORDER — POTASSIUM CHLORIDE 20 MEQ PO PACK
20.0000 meq | PACK | Freq: Every day | ORAL | Status: DC
  Administered 2018-05-23 – 2018-05-24 (×2): 20 meq via ORAL
  Filled 2018-05-22 (×2): qty 1

## 2018-05-22 MED ORDER — TERAZOSIN HCL 2 MG PO CAPS
4.0000 mg | ORAL_CAPSULE | Freq: Every day | ORAL | Status: DC
  Administered 2018-05-23 – 2018-05-24 (×2): 4 mg via ORAL
  Filled 2018-05-22 (×2): qty 2

## 2018-05-22 MED ORDER — SENNOSIDES-DOCUSATE SODIUM 8.6-50 MG PO TABS
1.0000 | ORAL_TABLET | Freq: Two times a day (BID) | ORAL | Status: DC
  Administered 2018-05-22 – 2018-05-24 (×5): 1 via ORAL
  Filled 2018-05-22 (×5): qty 1

## 2018-05-22 MED ORDER — GABAPENTIN 100 MG PO CAPS
200.0000 mg | ORAL_CAPSULE | Freq: Two times a day (BID) | ORAL | Status: DC
  Administered 2018-05-22 – 2018-05-24 (×5): 200 mg via ORAL
  Filled 2018-05-22 (×6): qty 2

## 2018-05-22 MED ORDER — VITAMIN B COMPLEX PO TABS: 1.0000 | ORAL_TABLET | Freq: Every day | ORAL | Status: DC

## 2018-05-22 MED ORDER — CLOPIDOGREL BISULFATE 75 MG PO TABS
75.0000 mg | ORAL_TABLET | Freq: Every day | ORAL | Status: DC
  Administered 2018-05-23 – 2018-05-24 (×2): 75 mg via ORAL
  Filled 2018-05-22 (×2): qty 1

## 2018-05-22 MED ORDER — FLUTICASONE PROPIONATE 50 MCG/ACT NA SUSP
1.0000 | Freq: Every day | NASAL | Status: DC
  Administered 2018-05-23 – 2018-05-24 (×2): 1 via NASAL
  Filled 2018-05-22: qty 16

## 2018-05-22 MED ORDER — AMITRIPTYLINE HCL 25 MG PO TABS
25.0000 mg | ORAL_TABLET | Freq: Every day | ORAL | Status: DC
  Administered 2018-05-22 – 2018-05-24 (×3): 25 mg via ORAL
  Filled 2018-05-22 (×3): qty 1

## 2018-05-22 MED ORDER — GENTEAL TEARS NIGHT-TIME OP OINT
1.0000 | TOPICAL_OINTMENT | OPHTHALMIC | Status: DC
Start: 2018-05-22 — End: 2018-05-22

## 2018-05-22 MED ORDER — ENOXAPARIN SODIUM 30 MG/0.3ML ~~LOC~~ SOLN
30.0000 mg | SUBCUTANEOUS | Status: DC
  Administered 2018-05-22 – 2018-05-24 (×3): 30 mg via SUBCUTANEOUS
  Filled 2018-05-22 (×3): qty 0.3

## 2018-05-22 MED ORDER — METOPROLOL TARTRATE 25 MG PO TABS
25.0000 mg | ORAL_TABLET | Freq: Two times a day (BID) | ORAL | Status: DC
  Administered 2018-05-22 – 2018-05-24 (×5): 25 mg via ORAL
  Filled 2018-05-22 (×5): qty 1

## 2018-05-22 MED ORDER — B COMPLEX-C PO TABS
1.0000 | ORAL_TABLET | Freq: Every day | ORAL | Status: DC
  Administered 2018-05-23 – 2018-05-24 (×2): 1 via ORAL
  Filled 2018-05-22 (×2): qty 1

## 2018-05-22 MED ORDER — VITAMIN B-12 1000 MCG PO TABS
1000.0000 ug | ORAL_TABLET | Freq: Every day | ORAL | Status: DC
  Administered 2018-05-23 – 2018-05-24 (×2): 1000 ug via ORAL
  Filled 2018-05-22 (×2): qty 1

## 2018-05-22 MED ORDER — FUROSEMIDE 80 MG PO TABS
80.0000 mg | ORAL_TABLET | Freq: Two times a day (BID) | ORAL | Status: DC
  Administered 2018-05-22 – 2018-05-24 (×5): 80 mg via ORAL
  Filled 2018-05-22 (×5): qty 1

## 2018-05-22 MED ORDER — ACETAMINOPHEN 325 MG PO TABS
650.0000 mg | ORAL_TABLET | ORAL | Status: DC | PRN
  Administered 2018-05-23: 650 mg via ORAL
  Filled 2018-05-22: qty 2

## 2018-05-22 MED ORDER — ACETAMINOPHEN 160 MG/5ML PO SOLN: 650.0000 mg | ORAL | Status: DC | PRN

## 2018-05-22 NOTE — H&P (Signed)
History and Physical    Jared Hall:[id number] DOB: 1931-11-12 DOA: 05/22/2018  PCP: Josetta Huddle, MD Consultants:  Neuro Patient coming from: SNF ([facility])  [facility]:  Name Relation Home Work Mobile  Broce,[name] Son [phone]  [phone]  Rockefeller,[name] Relative [phone]      Chief Complaint: R arm numbness  HPI: Jared Hall is a 84 y.o. male with medical history significant of CVA, diastolic CHF, HTN, dyslipidemia, prostate CA s/p XRT, OSA, T2DM who presented to ED today from [facility] with c/o RUE numbness. He also had some associated RUE weakness and he could not hold anything in his hand. He was worried this meant he was having a heart attack. States he felt fine when he woke up this morning and had a normal breakfast. It was afterwards during his bath around 9 am that his symptoms began while he was sitting in a chair being bathed. No other symptoms reported. Denies headache, chest pain, dyspnea, palpitations, lightheadedness. No fevers/chills, cough. He did report, when asked, that he had some N/V yesterday after eating and he continues to have some nausea today; however he is hungry. Interview is difficult due to pt's extreme HOH and tangential answers. Family reports he has no residual from his prior CVA. Pt has h/o diabetic peripheral neuropathy on his problem list; and from looking through encounters, appears this was diagnosed due to foot pain. Pt denies any symptoms of neuropathy in his feet however.  ED Course: Pt had dysmetria and possible dysarthria (pt edentulous so may have been due to that) in addition to reported R hand numbness and weakness; thus Code Stroke was called. Pt is extremely HOH at baseline and it was difficult to get much history. By the time neuro saw pt, he had no numbness or tingling. CT was negative. Neuro had concerns for TIA vs metabolic encephalopathy. Pt is on baseline O2 req at 100%. Vitals all WNL and stable. He has mild AKI. Awaiting U/A  and CXR.   Review of Systems: As per HPI; otherwise review of systems reviewed and negative.   Ambulatory Status: wheelchair  Past Medical History:  Diagnosis Date  . Alpha thalassemia ([place])   . Anemia 01/01/2017  . BPH (benign prostatic hyperplasia)   . CHF (congestive heart failure) ([place])   . High cholesterol   . Hypertension   . Lower extremity edema   . Onychomycosis   . Prostate cancer ([place])    S/P "8 weeks of radiation"  . Prostatitis    recurrent  . Sleep apnea   . Stroke [facility]) ~ 2005   denies residual on 2/123/2015  . Type II diabetes mellitus ([place])   . Umbilical hernia   . Urinary urgency    with incontinence    Past Surgical History:  Procedure Laterality Date  . ADENOIDECTOMY    . HERNIA REPAIR    . MASTOIDECTOMY    . TONSILLECTOMY    . VASECTOMY    . VENTRAL HERNIA REPAIR  12/18/2011   Procedure: HERNIA REPAIR VENTRAL ADULT;  Surgeon: Adin Hector, MD;  Location: [facility];  Service: General;  Laterality: N/A;    Social History   Socioeconomic History  . Marital status: Widowed    Spouse name: Not on file  . Number of children: Not on file  . Years of education: Not on file  . Highest education level: Not on file  Occupational History  . Not on file  Social Needs  . Financial resource strain:  Not on file  . Food insecurity:    Worry: Not on file    Inability: Not on file  . Transportation needs:    Medical: Not on file    Non-medical: Not on file  Tobacco Use  . Smoking status: Never Smoker  . Smokeless tobacco: Never Used  Substance and Sexual Activity  . Alcohol use: No  . Drug use: No  . Sexual activity: Never  Lifestyle  . Physical activity:    Days per week: Not on file    Minutes per session: Not on file  . Stress: Not on file  Relationships  . Social connections:    Talks on phone: Not on file    Gets together: Not on file    Attends religious service: Not on file    Active member of club or organization: Not on file     Attends meetings of clubs or organizations: Not on file    Relationship status: Not on file  . Intimate partner violence:    Fear of current or ex partner: Not on file    Emotionally abused: Not on file    Physically abused: Not on file    Forced sexual activity: Not on file  Other Topics Concern  . Not on file  Social History Narrative   Tobacco Use Cigarettes: Never Smoked   No Alcohol   Caffeine: Yes   No recreational drug use   Marital Status: Widowed           Allergies  Allergen Reactions  . Ativan [Lorazepam] Other (See Comments)    Flailing of extremities noted immediately s/p IV administration.  [name] Kitchen Flomax [Tamsulosin Hcl] Other (See Comments)    Excessive tearing  . Glucophage [Metformin Hcl] Diarrhea    Family History  Problem Relation Age of Onset  . Pneumonia Mother   . Heart attack Father     Prior to Admission medications   Medication Sig Start Date End Date Taking? Authorizing Provider  amitriptyline (ELAVIL) 25 MG tablet Take 1 tablet (25 mg total) by mouth at bedtime. 10/20/17   Rosita Fire, MD  aspirin EC 81 MG tablet Take 81 mg by mouth daily.    [provider]  B Complex Vitamins (VITAMIN B COMPLEX PO) Take 1 tablet by mouth daily.    [provider]  clopidogrel (PLAVIX) 75 MG tablet Take 75 mg by mouth daily.     [provider]  fluticasone (FLONASE) 50 MCG/ACT nasal spray Place 1 spray into both nostrils daily. 03/17/15   Kelvin Cellar, MD  furosemide (LASIX) 80 MG tablet Take 80 mg by mouth 2 (two) times daily.     [provider]  gabapentin (NEURONTIN) 300 MG capsule Take 1 capsule (300 mg total) by mouth 2 (two) times daily. Hold for sedation. 10/20/17   Rosita Fire, MD  hydrocerin (EUCERIN) CREA Apply 1 application topically daily. Patient taking differently: Apply 1 application topically 2 (two) times daily as needed.  01/09/17   Regalado, Belkys A, MD  insulin aspart (NOVOLOG) 100  UNIT/ML injection Inject 4-15 Units into the skin 4 (four) times daily as needed for high blood sugar. Blood sugar < 150 = 0 units 151-200 = 4 units 201-250 = 6 units 251-300 = 8 units 301-350 = 10 units 351-400 = 12 units >400 = 15 units    [provider]  insulin glargine (LANTUS) 100 UNIT/ML injection Inject 0.5 mLs (50 Units total) into the skin 2 (two)  times daily. 10/20/17   Rosita Fire, MD  metoprolol tartrate (LOPRESSOR) 25 MG tablet Take 1 tablet (25 mg total) by mouth 2 (two) times daily. 03/17/15   Kelvin Cellar, MD  pantoprazole (PROTONIX) 40 MG tablet Take 40 mg by mouth daily.     [provider]  potassium chloride (KLOR-CON) 20 MEQ packet Take 20 mEq by mouth daily.     [provider]  senna-docusate (SENOKOT-S) 8.6-50 MG tablet Take 1 tablet by mouth 2 (two) times daily.    [provider]  simvastatin (ZOCOR) 20 MG tablet Take 20 mg by mouth daily.    [provider]  terazosin (HYTRIN) 2 MG capsule Take 4 mg by mouth daily. Take two 2-mg tablets to = 4 mg    [provider]  traMADol (ULTRAM) 50 MG tablet Take 1 tablet (50 mg total) by mouth every 6 (six) hours as needed for moderate pain. 10/20/17   Rosita Fire, MD  vitamin B-12 (CYANOCOBALAMIN) 1000 MCG tablet Take 1,000 mcg by mouth daily.    [provider]    Physical Exam: Vitals:   05/22/18 1312 05/22/18 1401 05/22/18 1431 05/22/18 1500  BP:  111/86 (!) 125/59 122/61  Pulse:  78 78 75  Resp:  18 18   Temp: 98.2 F (36.8 C)     TempSrc:      SpO2:  99% 100% 100%     . General:  Appears calm and comfortable and is NAD.  [name] Kitchen Eyes:  PERRL, EOMI, normal lids, iris . ENT:  Extremely HOH, lips & tongue, mmm; appropriate dentition . Neck:  no LAD, masses or thyromegaly; no carotid bruits . Cardiovascular:  RRR, no m/r/g.  . Respiratory:   CTA bilaterally with no wheezes/rales/rhonchi.  Normal respiratory effort. . Abdomen:  soft,  NT, ND, NABS . Back:   normal alignment, no CVAT . Skin: See extremity exam; o/w no rash or induration seen on exam . Musculoskeletal:  grossly normal tone BUE/BLE, good ROM, no bony abnormality . Lower extremity: LE edema present. Erythema bilaterally R>L with warmth, nontender. No purulence but there is a small eschar R lateral calf as shown below. Significant onychomycosis of all toenails.  1+ distal pulses. .  .  .   [name] Kitchen Psychiatric:  grossly normal mood and affect . Neurologic:  speech is mildly dysarthric, oriented to self, month and year. Thinks he is at the "[facility]" and is unsure of day of week or date. CN2-12 grossly intact. 4/5 strength in all extremities. Sensation intact BUE to touch and temperature (cold). DTRs 1+ and symmetric upper ext, 0 lower extremities bilaterally. No dysmetria.    Radiological Exams on Admission: Ct Head Code Stroke Wo Contrast`  Result Date: 05/22/2018 CLINICAL DATA:  Code stroke. Numbness or tingling. Right-sided weakness EXAM: CT HEAD WITHOUT CONTRAST TECHNIQUE: Contiguous axial images were obtained from the base of the skull through the vertex without intravenous contrast. COMPARISON:  10/17/2017 FINDINGS: Brain: No evidence of acute infarction, hemorrhage, hydrocephalus, extra-axial collection or mass lesion/mass effect. Small remote infarcts in the right cerebellum, right frontal parietal cortex, and high anterior right frontal subcortical white matter. Chronic small vessel ischemia with extensive low-density in the cerebral white matter. Cerebral volume loss. Incidental retro cerebellar CSF accumulation, right eccentric. Vascular: Atherosclerotic calcification.  No hyperdense vessel. Skull: Negative Sinuses/Orbits: Negative Other: These results were communicated to Dr. Cheral Marker at [place] 7/3/2019by text page via the [facility] messaging system. ASPECTS [facility] Stroke Program Early  CT Score) - Ganglionic level infarction (caudate, lentiform nuclei,  internal capsule, insula, M1-M3 cortex): 7 - Supraganglionic infarction (M4-M6 cortex): 3 Total score (0-10 with 10 being normal): 10 IMPRESSION: 1. No acute finding 2. Extensive chronic ischemic injury. Electronically Signed   By: Monte Fantasia M.D.   On: 05/22/2018 14:00    EKG: Independently reviewed.  NSR with rate 74 PVC Prolonged PR interval Inferior infarct, old; no change from prior   Labs on Admission: I have personally reviewed the available labs and imaging studies at the time of the admission.  Pertinent labs:  Na 139 K 3.7 Cl 93 Gluc 70 BUN 16 Creat 1.80 (baseline 1.4 - 1.6) Trop 0.02 WBC 7.8 Hgb 10.4 Plt 200 INR 1.06 PTT 34  Assessment/Plan Active Problems:   History of CVA (cerebrovascular accident)   Dyslipidemia   AKI (acute kidney injury) ([place])   Essential hypertension, benign   Diabetic neuropathy (HCC)   DM type 2 with diabetic peripheral neuropathy (HCC)   Dysarthria   Paroxysmal atrial fibrillation (HCC)   Chronic kidney disease, stage III (moderate) (HCC)   Hard of hearing   Anemia   TIA (transient ischemic attack)   Cellulitis in diabetic foot (HCC)   TIA vs Metabolic Encephalopathy: his physical findings and CT would argue for the latter; he has presented similarly in the past when active infection present and he does appear to have an active cellulitis, albeit mild, with no fever, normal vitals, normal WBC. Also this could just be peripheral neuropathy from his DM. -start clindamycin for BLE cellulitis; pt was recently started on LVQ presumably for his BLE cellulitis; will continue as prior cultures grew pseudomonas -blood cultures x 2 sent -f/u U/A, CXR -monitor temp, WBC -check ABG -no h/o liver disease, will not check ammonia level -HbA1C, lipid panel ordered -PT/OT/ST -NPO until passes swallow screen -neuro checks -cont zocor -cont ASA, plavix  -cycle troponins given risk factors and RUE weakness, nausea  BLE cellulitis (vs  venous stasis dermatitis, less likely) -clindamycin plus continue LVQ that was already started at ALF (to cover gram positives / MRSA as well as pseudomonas) -f/u BCx x 2 -monitor clinically  DM2 with peripheral neuropathy -lantus - at home on 85 U BID; will order 70 U BID here and titrate as appropriate -accuchecks qAC and qHS with SSI -diabetic heart healthy diet if passes swallow screen -check HbA1C -cont elavil and neurontin (lower dose from 300 mg to 200 mg for now given concern for AMS as well as mild AKI) for peripheral neuropathy. Hold ultram for now unless pt c/o pain.  HTN and diastolic CHF, stable -cont metoprolol, hold lasix for now until BNP returns and based on creatinine, vol status  Acute on chronic kidney disease stage III -hold lasix for now -f/u BNP, follow volume status clinically -strict I/O  History of PSVT/A. fib -Previously on Eliquis, now discontinued.  -Continue metoprolol  Chronic hypoxic respiratory failure -cont home O2 settings  Dyslipidemia -cont zocor  DVT prophylaxis:  Lovenox Code Status: DNR - confirmed with patient Disposition Plan: TBD once clinically improved Consults called: Neuro Admission status: Admit - It is my clinical opinion that admission to INPATIENT is reasonable and necessary because of the expectation that this patient will require hospital care that crosses at least 2 midnights to treat this condition based on the medical complexity of the problems presented.  Given the aforementioned information, the predictability of an adverse outcome is felt to be significant.    Gay Filler  Renette Butters MD [facility]  If note is complete, please contact covering daytime or nighttime physician. [email or web address] Password [email or web address]  05/22/2018, 3:28 PM

## 2018-05-22 NOTE — ED Triage Notes (Signed)
Pt presents with report of numbness to both hands reported from staff at [facility].  Pt is poor historian, denies any pain. Pt reports he noticed numbness this morning at 0900 while he was in chair getting his bath.

## 2018-05-22 NOTE — Consult Note (Signed)
NEURO HOSPITALIST CONSULT NOTE   Requesting physician: Dr. Rex Kras  Reason for Consult: Bilateral hand numbness  History obtained from:   Patient and Chart    HPI:                                                                                                                                          Jared Hall is an 84 y.o. male who presented to today from [facility] with numbness of both hands beginning at 0900 while he was in his chair getting his bath. Poor historian, and also stated a c/c of RUE numbness. EMS was called by staff. On arrival to the ED, the patient denied bilateral numbness, stating his symptom was just RUE numbness. Family then saw patient and stated that he had new slurred speech. Code Stroke was called. NIHSS was 2 due to dysarthria and slight drift of RUE. No numbness or tingling at time of Neurology interview. Patient with poor communication and was not able to specify exactly what was wrong to Neurology team during evaluation in CT.   Home medications include ASA and Plavix.   Past Medical History:  Diagnosis Date  . Alpha thalassemia ([place])   . Anemia 01/01/2017  . BPH (benign prostatic hyperplasia)   . CHF (congestive heart failure) ([place])   . High cholesterol   . Hypertension   . Lower extremity edema   . Onychomycosis   . Prostate cancer ([place])    S/P "8 weeks of radiation"  . Prostatitis    recurrent  . Sleep apnea   . Stroke [facility]) ~ 2005   denies residual on 2/123/2015  . Type II diabetes mellitus ([place])   . Umbilical hernia   . Urinary urgency    with incontinence    Past Surgical History:  Procedure Laterality Date  . ADENOIDECTOMY    . HERNIA REPAIR    . MASTOIDECTOMY    . TONSILLECTOMY    . VASECTOMY    . VENTRAL HERNIA REPAIR  12/18/2011   Procedure: HERNIA REPAIR VENTRAL ADULT;  Surgeon: Adin Hector, MD;  Location: [facility];  Service: General;  Laterality: N/A;    Family History  Problem Relation Age of Onset    . Pneumonia Mother   . Heart attack Father              Social History:  reports that he has never smoked. He has never used smokeless tobacco. He reports that he does not drink alcohol or use drugs.  Allergies  Allergen Reactions  . Ativan [Lorazepam] Other (See Comments)    Flailing of extremities noted immediately s/p IV administration.  [name] Kitchen Flomax [Tamsulosin Hcl] Other (See Comments)    Excessive tearing  . Glucophage [Metformin Hcl] Diarrhea  HOME MEDICATIONS:                                                                                                                        ROS:                                                                                                                                       Patient unable to provide a detailed ROS due to cognitive/communication deficit.    Blood pressure 111/86, pulse 78, temperature 98.2 F (36.8 C), resp. rate 18, SpO2 99 %.   General Examination:                                                                                                       Physical Exam  HEENT-  [place]/AT  Lungs- Respirations unlabored Extremities- Prominent pretibial and pedal edema bilaterally, with skin discoloration.   Neurological Examination Mental Status: Awake with mildly decreased level of alertness. Unable to answer 50% of questions clearly. Short answers to most questions. Dysarthric speech with some answers to questions difficult to comprehend. Some commands required repetition. Had difficulty naming fingers. Able to name a pen. Oriented to self, age, city and state.  Cranial Nerves: II: Visual fields with intact blink to threat bilaterally.PERRL III,IV, VI: No ptosis. Saccadic visual pursuits to left and right. No nystagmus.  V,VII: Smile symmetric, facial sensation intact to temp bilaterally VIII:HOH IX,X: Hyophonic with mild pharyngeal  XI: Symmetric XII: midline tongue extension. Mild lingual dysarthria. Motor: Bilateral  upper extremities 4/5 proximal and distal without asymmetry. In the context of prominent adiposity and distal edema, patient able to elevate legs antigravity bilaterally. Maximum strength with knee extension is 4/5 bilaterally.  Sensory: Temp and FT sensation intact bilateral upper ext. Decreased sensation to legs below knees bilaterally. No extinction to DSS thighs bilaterally.  Deep Tendon Reflexes: 1+ bilateral upper ext. 0 patellae and achilles bilaterally.  Plantars: Mute bilaterally.  Cerebellar: No ataxia with FNF bilaterally.  Unable to perform HS Gait: Deferred due to falls risk concerns.    Lab Results: Basic Metabolic Panel: Recent Labs  Lab 05/22/18 1300 05/22/18 1310  NA 141 139  K 3.7 3.7  CL 98 93*  CO2 35*  --   GLUCOSE 72 70  BUN 13 16  CREATININE 1.79* 1.80*  CALCIUM 9.4  --     CBC: Recent Labs  Lab 05/22/18 1300 05/22/18 1310  WBC 7.8  --   NEUTROABS 4.9  --   HGB 10.4* 11.9*  HCT 35.9* 35.0*  MCV 71.7*  --   PLT 200  --     Cardiac Enzymes: No results for input(s): CKTOTAL, CKMB, CKMBINDEX, TROPONINI in the last 168 hours.  Lipid Panel: No results for input(s): CHOL, TRIG, HDL, CHOLHDL, VLDL, LDLCALC in the last 168 hours.  Imaging: Ct Head Code Stroke Wo Contrast`  Result Date: 05/22/2018 CLINICAL DATA:  Code stroke. Numbness or tingling. Right-sided weakness EXAM: CT HEAD WITHOUT CONTRAST TECHNIQUE: Contiguous axial images were obtained from the base of the skull through the vertex without intravenous contrast. COMPARISON:  10/17/2017 FINDINGS: Brain: No evidence of acute infarction, hemorrhage, hydrocephalus, extra-axial collection or mass lesion/mass effect. Small remote infarcts in the right cerebellum, right frontal parietal cortex, and high anterior right frontal subcortical white matter. Chronic small vessel ischemia with extensive low-density in the cerebral white matter. Cerebral volume loss. Incidental retro cerebellar CSF accumulation,  right eccentric. Vascular: Atherosclerotic calcification.  No hyperdense vessel. Skull: Negative Sinuses/Orbits: Negative Other: These results were communicated to Dr. Cheral Marker at [place] 7/3/2019by text page via the [facility] messaging system. ASPECTS [facility] Stroke Program Early CT Score) - Ganglionic level infarction (caudate, lentiform nuclei, internal capsule, insula, M1-M3 cortex): 7 - Supraganglionic infarction (M4-M6 cortex): 3 Total score (0-10 with 10 being normal): 10 IMPRESSION: 1. No acute finding 2. Extensive chronic ischemic injury. Electronically Signed   By: Monte Fantasia M.D.   On: 05/22/2018 14:00    Assessment: 84 year old male with bilateral hand numbness, then a complaint of RUE numbness, then slurred speech 1. No clear localization on exam. No lateralized weakness. Has dysarthria with possible dysphasia, but without lateralized motor or sensory findings, more likely secondary to confusion/AMS than stroke. Not a tPA candidate due to time critieria. Not an endovascular candidate due to poor functional status at baseline and low NIHSS.  2. CT without acute abnormality. Chronic small vessel ischemic changes are noted.   Recommendations: 1. MRI brain. 2. Work up for toxic/metabolic/infectious etiologies.  3. Consider obtaining an ABG.   Electronically signed: Dr. Kerney Elbe  05/22/2018, 2:30 PM

## 2018-05-22 NOTE — Code Documentation (Signed)
85 yo male coming from [facility] with complaints of right arm numbness that started at 0900 this morning. Staff called EMS and EMS transported patient. EMS was told that patient had bilateral numbness that had resolved. When ED RN examined patient, pt stated that he had never had bilateral numbness, but the numbness was in the right arm. Family met patient in the ED and stated he had slurred speech. MD was made aware and called a Code Stroke. Stroke Team met patient in CT. NIHSS 2 due to dysarthria and slight drift of the right arm. Denies any sensory changes at this time. CT complete. Pt is outside window for tPA and no sign of LVO. Pt is alert and oriented x4. Handoff given to [place], Therapist, sports.

## 2018-05-22 NOTE — ED Provider Notes (Addendum)
[facility] EMERGENCY DEPARTMENT Provider Note   CSN: [id number] Arrival date & time: 05/22/18  1209     History   Chief Complaint Chief Complaint  Patient presents with  . Numbness    HPI Jared Hall is a 84 y.o. male.  37-year-old male with past medical history including CVA, CHF, hypertension, OSA, type 2 diabetes mellitus, alpha thalassemia who presents with numbness and weakness.  Around 9 AM this morning, the patient was trying to get off of the commode and was reaching with his arms.  He noted that his right arm felt numb and weak.  He initially reported to nursing facility that he had numbness in both hands but for me he states that it was predominantly his right hand.  He states that he was not having this problem earlier this morning at breakfast.  He denies any headache, lower extremity weakness, or other complaints.  He has chronic lower extremity edema for which he wears compression stockings.  He denies any complaints of pain.  LEVEL 5 CAVEAT DUE TO PATIENT HARD OF HEARING  The history is provided by the patient and a relative.    Past Medical History:  Diagnosis Date  . Alpha thalassemia ([place])   . Anemia 01/01/2017  . BPH (benign prostatic hyperplasia)   . CHF (congestive heart failure) ([place])   . High cholesterol   . Hypertension   . Lower extremity edema   . Onychomycosis   . Prostate cancer ([place])    S/P "8 weeks of radiation"  . Prostatitis    recurrent  . Sleep apnea   . Stroke [facility]) ~ 2005   denies residual on 2/123/2015  . Type II diabetes mellitus ([place])   . Umbilical hernia   . Urinary urgency    with incontinence    Patient Active Problem List   Diagnosis Date Noted  . Tremor 10/17/2017  . Acute encephalopathy   . Acute ischemic stroke ([place])   . Generalized weakness   . Cerebrovascular disease   . Altered mental status   . Acute respiratory failure with hypercapnia ([place])   . CVA (cerebral vascular accident) ([place])  01/01/2017  . Dehydration 01/01/2017  . Anemia 01/01/2017  . Acute respiratory failure ([place]) 12/15/2016  . Morbid obesity ([place]) 12/15/2016  . Hard of hearing 12/15/2016  . Chronic kidney disease, stage III (moderate) ([place]) 12/06/2016  . Bell's palsy   . Facial droop   . Paroxysmal atrial fibrillation (HCC)   . Embolic stroke ([place])   . Dysphagia 03/14/2015  . Dysarthria 03/14/2015  . Stroke ([place]) 03/14/2015  . Cellulitis 01/11/2014  . Bilateral lower extremity edema 01/11/2014  . Venous stasis dermatitis 01/11/2014  . DM type 2 with diabetic peripheral neuropathy ([place]) 01/11/2014  . Cellulitis and abscess of leg 01/11/2014  . Cellulitis and abscess 01/11/2014  . Depression 06/15/2013  . GERD (gastroesophageal reflux disease) 06/15/2013  . Constipation 06/15/2013  . Diabetic neuropathy ([place]) 06/15/2013  . Type 1 diabetes mellitus with peripheral circulatory complications ([place]) 73/22/0254  . Essential hypertension, benign 04/07/2013  . AKI (acute kidney injury) ([place]) 04/04/2013  . Bilateral lower leg cellulitis 04/04/2013  . Right hip pain 04/04/2013  . Renal insufficiency 12/16/2011  . History of CVA (cerebrovascular accident) 12/16/2011  . Dyslipidemia 12/16/2011  . Obesity (BMI 30-39.9) 12/16/2011  . Umbilical hernia with obstruction-partial on CT scan; easily reducible 12/14/2011  . History of PSVT (paroxysmal supraventricular tachycardia) 12/14/2011  . DM (diabetes mellitus) ([place]) 12/14/2011  Past Surgical History:  Procedure Laterality Date  . ADENOIDECTOMY    . HERNIA REPAIR    . MASTOIDECTOMY    . TONSILLECTOMY    . VASECTOMY    . VENTRAL HERNIA REPAIR  12/18/2011   Procedure: HERNIA REPAIR VENTRAL ADULT;  Surgeon: Adin Hector, MD;  Location: [facility];  Service: General;  Laterality: N/A;        Home Medications    Prior to Admission medications   Medication Sig Start Date End Date Taking? Authorizing Provider  amitriptyline (ELAVIL) 25 MG tablet Take 1  tablet (25 mg total) by mouth at bedtime. 10/20/17   Rosita Fire, MD  aspirin EC 81 MG tablet Take 81 mg by mouth daily.    [provider]  B Complex Vitamins (VITAMIN B COMPLEX PO) Take 1 tablet by mouth daily.    [provider]  clopidogrel (PLAVIX) 75 MG tablet Take 75 mg by mouth daily.     [provider]  fluticasone (FLONASE) 50 MCG/ACT nasal spray Place 1 spray into both nostrils daily. 03/17/15   Kelvin Cellar, MD  furosemide (LASIX) 80 MG tablet Take 80 mg by mouth 2 (two) times daily.     [provider]  gabapentin (NEURONTIN) 300 MG capsule Take 1 capsule (300 mg total) by mouth 2 (two) times daily. Hold for sedation. 10/20/17   Rosita Fire, MD  hydrocerin (EUCERIN) CREA Apply 1 application topically daily. Patient taking differently: Apply 1 application topically 2 (two) times daily as needed.  01/09/17   Regalado, Belkys A, MD  insulin aspart (NOVOLOG) 100 UNIT/ML injection Inject 4-15 Units into the skin 4 (four) times daily as needed for high blood sugar. Blood sugar < 150 = 0 units 151-200 = 4 units 201-250 = 6 units 251-300 = 8 units 301-350 = 10 units 351-400 = 12 units >400 = 15 units    [provider]  insulin glargine (LANTUS) 100 UNIT/ML injection Inject 0.5 mLs (50 Units total) into the skin 2 (two) times daily. 10/20/17   Rosita Fire, MD  metoprolol tartrate (LOPRESSOR) 25 MG tablet Take 1 tablet (25 mg total) by mouth 2 (two) times daily. 03/17/15   Kelvin Cellar, MD  pantoprazole (PROTONIX) 40 MG tablet Take 40 mg by mouth daily.     [provider]  potassium chloride (KLOR-CON) 20 MEQ packet Take 20 mEq by mouth daily.     [provider]  senna-docusate (SENOKOT-S) 8.6-50 MG tablet Take 1 tablet by mouth 2 (two) times daily.    [provider]  simvastatin (ZOCOR) 20 MG tablet Take 20 mg by mouth daily.    [provider]  terazosin (HYTRIN) 2 MG  capsule Take 4 mg by mouth daily. Take two 2-mg tablets to = 4 mg    [provider]  traMADol (ULTRAM) 50 MG tablet Take 1 tablet (50 mg total) by mouth every 6 (six) hours as needed for moderate pain. 10/20/17   Rosita Fire, MD  vitamin B-12 (CYANOCOBALAMIN) 1000 MCG tablet Take 1,000 mcg by mouth daily.    [provider]    Family History Family History  Problem Relation Age of Onset  . Pneumonia Mother   . Heart attack Father     Social History Social History   Tobacco Use  . Smoking status: Never Smoker  . Smokeless tobacco: Never Used  Substance Use Topics  . Alcohol use: No  . Drug use: No  Allergies   Ativan [lorazepam]; Flomax [tamsulosin hcl]; and Glucophage [metformin hcl]   Review of Systems Review of Systems  Unable to perform ROS: Other  Patient hard of hearing and poor historian   Physical Exam Updated Vital Signs BP 122/61   Pulse 75   Temp 98.2 F (36.8 C)   Resp 18   SpO2 100%   Physical Exam  Constitutional: He appears well-developed and well-nourished. No distress.  Eyes closed but opens to voice  HENT:  Head: Normocephalic and atraumatic.  Eyes: Pupils are equal, round, and reactive to light. Conjunctivae and EOM are normal.  Neck: Neck supple.  Cardiovascular: Normal rate, regular rhythm and normal heart sounds.  No murmur heard. Pulmonary/Chest: Effort normal and breath sounds normal. No respiratory distress.  Abdominal: Soft. Bowel sounds are normal. He exhibits no distension. There is no tenderness.  Musculoskeletal: He exhibits edema.  3+ pitting edema BLE R>L  Neurological: He is alert. He has normal reflexes. No cranial nerve deficit. He exhibits normal muscle tone.  Hard of hearing, mumbling speech without obvious aphasia or dysarthria, difficulty following 2 step commands, dysmetria on R finger to nose testing,  negative pronator drift, no clonus 5/5 strength and normal sensation LUE, BLE 4/5 grip  strength RUE, 5/5 biceps/triceps strength RUE  Skin: Skin is warm and dry.  Erythema b/l lower legs  Nursing note and vitals reviewed.    ED Treatments / Results  Labs (all labs ordered are listed, but only abnormal results are displayed) Labs Reviewed  CBC - Abnormal; Notable for the following components:      Result Value   Hemoglobin 10.4 (*)    HCT 35.9 (*)    MCV 71.7 (*)    MCH 20.8 (*)    MCHC 29.0 (*)    All other components within normal limits  DIFFERENTIAL - Abnormal; Notable for the following components:   Monocytes Absolute 1.1 (*)    All other components within normal limits  COMPREHENSIVE METABOLIC PANEL - Abnormal; Notable for the following components:   CO2 35 (*)    Creatinine, Ser 1.79 (*)    Total Protein 6.4 (*)    Albumin 3.1 (*)    GFR calc non Af Amer 33 (*)    GFR calc Af Amer 38 (*)    All other components within normal limits  I-STAT CHEM 8, ED - Abnormal; Notable for the following components:   Chloride 93 (*)    Creatinine, Ser 1.80 (*)    TCO2 33 (*)    Hemoglobin 11.9 (*)    HCT 35.0 (*)    All other components within normal limits  PROTIME-INR  APTT  RAPID URINE DRUG SCREEN, HOSP PERFORMED  URINALYSIS, ROUTINE W REFLEX MICROSCOPIC  BRAIN NATRIURETIC PEPTIDE  I-STAT TROPONIN, ED  CBG MONITORING, ED  I-STAT VENOUS BLOOD GAS, ED    EKG EKG Interpretation  Date/Time:  Wednesday May 22 2018 12:26:40 EDT Ventricular Rate:  74 PR Interval:    QRS Duration: 101 QT Interval:  380 QTC Calculation: 422 R Axis:   -10 Text Interpretation:  Sinus rhythm Ventricular premature complex Prolonged PR interval Inferior infarct, old similar to previous Confirmed by Theotis Burrow [phone]) on 05/22/2018 12:44:44 PM Also confirmed by Theotis Burrow [phone]), editor Lynder Parents [phone])  on 05/22/2018 3:07:56 PM   Radiology Ct Head Code Stroke Wo Contrast`  Result Date: 05/22/2018 CLINICAL DATA:  Code stroke. Numbness or tingling. Right-sided  weakness EXAM: CT HEAD WITHOUT CONTRAST TECHNIQUE: Contiguous  axial images were obtained from the base of the skull through the vertex without intravenous contrast. COMPARISON:  10/17/2017 FINDINGS: Brain: No evidence of acute infarction, hemorrhage, hydrocephalus, extra-axial collection or mass lesion/mass effect. Small remote infarcts in the right cerebellum, right frontal parietal cortex, and high anterior right frontal subcortical white matter. Chronic small vessel ischemia with extensive low-density in the cerebral white matter. Cerebral volume loss. Incidental retro cerebellar CSF accumulation, right eccentric. Vascular: Atherosclerotic calcification.  No hyperdense vessel. Skull: Negative Sinuses/Orbits: Negative Other: These results were communicated to Dr. Cheral Marker at [facility] 7/3/2019by text page via the [facility] messaging system. ASPECTS [facility] Stroke Program Early CT Score) - Ganglionic level infarction (caudate, lentiform nuclei, internal capsule, insula, M1-M3 cortex): 7 - Supraganglionic infarction (M4-M6 cortex): 3 Total score (0-10 with 10 being normal): 10 IMPRESSION: 1. No acute finding 2. Extensive chronic ischemic injury. Electronically Signed   By: Monte Fantasia M.D.   On: 05/22/2018 14:00    Procedures .Critical Care Performed by: Sharlett Iles, MD Authorized by: Sharlett Iles, MD   Critical care provider statement:    Critical care time (minutes):  30   Critical care time was exclusive of:  Separately billable procedures and treating other patients   Critical care was necessary to treat or prevent imminent or life-threatening deterioration of the following conditions:  CNS failure or compromise   Critical care was time spent personally by me on the following activities:  Development of treatment plan with patient or surrogate, discussions with consultants, examination of patient, obtaining history from patient or surrogate, ordering and performing treatments and  interventions, ordering and review of laboratory studies, ordering and review of radiographic studies and re-evaluation of patient's condition   (including critical care time)  Medications Ordered in ED Medications - No data to display   Initial Impression / Assessment and Plan / ED Course  I have reviewed the triage vital signs and the nursing notes.  Pertinent labs & imaging results that were available during my care of the patient were reviewed by me and considered in my medical decision making (see chart for details).     On arrival he was alert, no acute distress.  Stable vital signs.  He initially complained of bilateral hand numbness but later told me that it was his right hand that felt numb and weaker than left. I did note significant BLE edema R>L which daughter-in-law states is chronic.  Because of his dysmetria and weak grip strength on the right that he states is new and the fact that daughter-in-law states he had no previous deficits from stroke, called a code stroke and obtained emergent head CT.  Pt evaluated by neurology team, Dr. Cheral Marker. Head CT negative acute. He reviewed imaging and determined pt not a tPA candidate. He has recommended MRI brain and metabolic w/u. Pt's labs show mild AKI w/ Cr. 1.8, baseline 1.4-1.6. UA pending. I have also ordered CXR. Discussed admission w/ [facility], Dr. Steffanie Dunn, and pt admitted for further w/u.  Final Clinical Impressions(s) / ED Diagnoses   Final diagnoses:  Numbness of right hand    ED Discharge Orders    None       Little, Wenda Overland, MD 05/22/18 1559    Rex Kras, Wenda Overland, MD 05/22/18 [phone]

## 2018-05-22 NOTE — ED Notes (Signed)
Crayton Savarese - daughter-in-law, [phone] - cell;   [phone] - home

## 2018-05-23 ENCOUNTER — Inpatient Hospital Stay (HOSPITAL_COMMUNITY): Payer: Medicare Other

## 2018-05-23 DIAGNOSIS — N183 Chronic kidney disease, stage 3 (moderate): Secondary | ICD-10-CM

## 2018-05-23 DIAGNOSIS — E1149 Type 2 diabetes mellitus with other diabetic neurological complication: Secondary | ICD-10-CM

## 2018-05-23 DIAGNOSIS — L03119 Cellulitis of unspecified part of limb: Secondary | ICD-10-CM

## 2018-05-23 DIAGNOSIS — I480 Paroxysmal atrial fibrillation: Secondary | ICD-10-CM

## 2018-05-23 DIAGNOSIS — I10 Essential (primary) hypertension: Secondary | ICD-10-CM

## 2018-05-23 DIAGNOSIS — R200 Anesthesia of skin: Secondary | ICD-10-CM

## 2018-05-23 DIAGNOSIS — E11628 Type 2 diabetes mellitus with other skin complications: Secondary | ICD-10-CM

## 2018-05-23 LAB — LIPID PANEL
Cholesterol: 123 mg/dL (ref 0–200)
HDL: 26 mg/dL — ABNORMAL LOW (ref >40)
LDL Cholesterol: 63 mg/dL (ref 0–99)
Total CHOL/HDL Ratio: 4.7 RATIO
Triglycerides: 169 mg/dL — ABNORMAL HIGH (ref <150)
VLDL: 34 mg/dL (ref 0–40)

## 2018-05-23 LAB — GLUCOSE, CAPILLARY
Glucose-Capillary: 120 mg/dL — ABNORMAL HIGH (ref 70–99)
Glucose-Capillary: 135 mg/dL — ABNORMAL HIGH (ref 70–99)
Glucose-Capillary: 134 mg/dL — ABNORMAL HIGH (ref 70–99)
Glucose-Capillary: 162 mg/dL — ABNORMAL HIGH (ref 70–99)

## 2018-05-23 LAB — BASIC METABOLIC PANEL
Anion gap: 9 (ref 5–15)
BUN: 13 mg/dL (ref 8–23)
CO2: 34 mmol/L — ABNORMAL HIGH (ref 22–32)
Calcium: 9 mg/dL (ref 8.9–10.3)
Chloride: 97 mmol/L — ABNORMAL LOW (ref 98–111)
Creatinine, Ser: 1.82 mg/dL — ABNORMAL HIGH (ref 0.61–1.24)
GFR calc Af Amer: 37 mL/min — ABNORMAL LOW (ref >60)
GFR calc non Af Amer: 32 mL/min — ABNORMAL LOW (ref >60)
Glucose, Bld: 131 mg/dL — ABNORMAL HIGH (ref 70–99)
Potassium: 3.7 mmol/L (ref 3.5–5.1)
Sodium: 140 mmol/L (ref 135–145)

## 2018-05-23 LAB — CBC
HCT: 34.5 % — ABNORMAL LOW (ref 39.0–52.0)
Hemoglobin: 10 g/dL — ABNORMAL LOW (ref 13.0–17.0)
MCH: 20.7 pg — ABNORMAL LOW (ref 26.0–34.0)
MCHC: 29 g/dL — ABNORMAL LOW (ref 30.0–36.0)
MCV: 71.6 fL — ABNORMAL LOW (ref 78.0–100.0)
Platelets: 191 10*3/uL (ref 150–400)
RBC: 4.82 MIL/uL (ref 4.22–5.81)
RDW: 14.8 % (ref 11.5–15.5)
WBC: 6.5 10*3/uL (ref 4.0–10.5)

## 2018-05-23 LAB — TROPONIN I: Troponin I: <0.03 ng/mL (ref <0.03)

## 2018-05-23 LAB — MRSA PCR SCREENING: MRSA by PCR: POSITIVE — AB

## 2018-05-23 MED ORDER — CEFAZOLIN SODIUM-DEXTROSE 1-4 GM/50ML-% IV SOLN
1.0000 g | Freq: Two times a day (BID) | INTRAVENOUS | Status: DC
  Administered 2018-05-23 – 2018-05-24 (×3): 1 g via INTRAVENOUS
  Filled 2018-05-23 (×4): qty 50

## 2018-05-23 MED ORDER — LEVOFLOXACIN 500 MG PO TABS: 500.0000 mg | ORAL_TABLET | Freq: Every day | ORAL | Status: DC

## 2018-05-23 NOTE — Plan of Care (Signed)
Patient complained of pain to the buttocks. Tylenol 650mg  was given and patient was positioned on his left side with pillow support. Will continue to monitor.

## 2018-05-23 NOTE — Progress Notes (Signed)
Pharmacy Antibiotic Note  Jared Hall is a 84 y.o. male admitted on 05/22/2018 with cellulitis.  Pharmacy has been consulted for Cefazolin dosing. Note he has CKD with SCr 1.82 today.  Estimated CrCl is ~30 ml/min.  Plan: Cefazolin 1g IV q12 As no dosing adjustments are anticipated pharmacy will sign off. Thank you for the consult.     Temp (24hrs), Avg:97.9 F (36.6 C), Min:97.5 F (36.4 C), Max:98.2 F (36.8 C)  Recent Labs  Lab 05/22/18 1300 05/22/18 1310 05/23/18 0023  WBC 7.8  --  6.5  CREATININE 1.79* 1.80* 1.82*    CrCl cannot be calculated (Unknown ideal weight.).    Allergies  Allergen Reactions  . Ativan [Lorazepam] Other (See Comments)    Flailing of extremities noted immediately s/p IV administration.  [name] Kitchen Flomax [Tamsulosin Hcl] Other (See Comments)    Excessive tearing  . Glucophage [Metformin Hcl] Diarrhea    [name], Pharm.D., BCPS, BCIDP Clinical Pharmacist Phone: [phone] Please check AMION for all [facility] numbers 05/23/2018, 10:53 AM

## 2018-05-23 NOTE — Plan of Care (Signed)
Patient remains on 2L of oxygen. Patient complained of pain to bottom; patient was given Tylenol 650mg  and positioned on left side with pillow support. Patient appears to be resting well. Will continue to monitor throughout shift.

## 2018-05-23 NOTE — CV Procedure (Signed)
Attempted lower extremity venous duplex. Patient was agitated and stated he was tired of all these people bothering him. I explained the procedure uncovered his leg to begin the exam. The patient grabbed my hand and said leave me alone. Exam terminated.   Darlina Sicilian RDCS

## 2018-05-23 NOTE — Evaluation (Signed)
Physical Therapy Evaluation Patient Details Name: Jared Hall MRN: [id number] DOB: 1931-03-09 Today's Date: 05/23/2018   History of Present Illness  Jared Hall is a 84 y.o. male with medical history significant of CVA (no residual deficits), diastolic CHF, HTN, dyslipidemia, prostate CA s/p XRT, OSA, HOH, T2DM who presented to ED from [facility] with RUE numbness.   Clinical Impression  Orders received for PT evaluation. Patient demonstrates deficits in functional mobility as indicated below. Could benefit from continued skilled PT to address deficits and maximize function, unclear how far from previous baseline at this time. At this time, feel patient may be close to mobility baseline, will defer further needs to SNF.      Follow Up Recommendations SNF;Supervision for mobility/OOB    Equipment Recommendations  None recommended by PT    Recommendations for Other Services       Precautions / Restrictions Precautions Precautions: Fall      Mobility  Bed Mobility Overal bed mobility: Needs Assistance Bed Mobility: Rolling;Supine to Sit Rolling: Min assist   Supine to sit: Min assist     General bed mobility comments: Min assist for safety due to impulsivity. HOB elevated  Transfers Overall transfer level: Needs assistance   Transfers: Sit to/from [employer] Transfers Sit to Stand: Min assist   Squat pivot transfers: Min assist     General transfer comment: Min assist (+2) to pivot to chair, and power up from chair (did not reach full upright positioning)(to drop arm recliner)  Ambulation/Gait             General Gait Details: non ambulatory  Stairs            Wheelchair Mobility    Modified Rankin (Stroke Patients Only)       Balance Overall balance assessment: Needs assistance Sitting-balance support: Feet supported Sitting balance-Leahy Scale: Fair Sitting balance - Comments: able to sit EOB and in chair, some dynamic movements  tolerated without assist                                     Pertinent Vitals/Pain Pain Assessment: No/denies pain    Home Living Family/patient expects to be discharged to:: Skilled nursing facility                 Additional Comments: [facility]    Prior Function Level of Independence: Needs assistance   Gait / Transfers Assistance Needed: transfers to [facility] independently, self propels WC to dining room, dresses self; pt stated he's been non ambulatory for 5 years  ADL's / Homemaking Assistance Needed: assist to bathe  Comments: pt states he was mostly mod I with adls. Staff assists with showers 2x week.       Hand Dominance   Dominant Hand: Right    Extremity/Trunk Assessment   Upper Extremity Assessment Upper Extremity Assessment: Generalized weakness    Lower Extremity Assessment Lower Extremity Assessment: Generalized weakness(noted BLE edema R>L)    Cervical / Trunk Assessment Cervical / Trunk Assessment: (increased body habitus)  Communication   Communication: HOH(extremely)  Cognition Arousal/Alertness: Awake/alert Behavior During Therapy: WFL for tasks assessed/performed;Impulsive Overall Cognitive Status: Impaired/Different from baseline Area of Impairment: Attention;Orientation;Awareness;Problem solving                 Orientation Level: Disoriented to;Place;Time;Situation Current Attention Level: Sustained       Awareness: Emergent Problem Solving: Slow processing;Difficulty  sequencing;Requires verbal cues;Requires tactile cues General Comments: pleasently confused during session      General Comments General comments (skin integrity, edema, etc.): noted redness and erythema around multiple spots BLEs.    Exercises     Assessment/Plan    PT Assessment All further PT needs can be met in the next venue of care  PT Problem List Decreased strength;Decreased activity tolerance;Decreased balance;Decreased  mobility;Decreased cognition;Decreased safety awareness;Obesity;Cardiopulmonary status limiting activity       PT Treatment Interventions      PT Goals (Current goals can be found in the Care Plan section)  Acute Rehab PT Goals Patient Stated Goal: none stated PT Goal Formulation: With patient Time For Goal Achievement: 06/06/18 Potential to Achieve Goals: Good    Frequency     Barriers to discharge        Co-evaluation PT/OT/SLP Co-Evaluation/Treatment: Yes Reason for Co-Treatment: Complexity of the patient's impairments (multi-system involvement);For patient/therapist safety;Necessary to address cognition/behavior during functional activity;To address functional/ADL transfers PT goals addressed during session: Mobility/safety with mobility OT goals addressed during session: ADL's and self-care       AM-PAC PT "6 Clicks" Daily Activity  Outcome Measure Difficulty turning over in bed (including adjusting bedclothes, sheets and blankets)?: Unable Difficulty moving from lying on back to sitting on the side of the bed? : Unable Difficulty sitting down on and standing up from a chair with arms (e.g., wheelchair, bedside commode, etc,.)?: Unable Help needed moving to and from a bed to chair (including a wheelchair)?: A Little Help needed walking in hospital room?: A Lot Help needed climbing 3-5 steps with a railing? : Total 6 Click Score: 9    End of Session Equipment Utilized During Treatment: Oxygen Activity Tolerance: Patient limited by fatigue Patient left: in chair;with call bell/phone within reach;with chair alarm set;with nursing/sitter in room Nurse Communication: Mobility status PT Visit Diagnosis: Other symptoms and signs involving the nervous system (R29.898);Muscle weakness (generalized) (M62.81)    Time: 0340-3524 PT Time Calculation (min) (ACUTE ONLY): 24 min   Charges:   PT Evaluation $PT Eval Moderate Complexity: 1 Mod     PT G Codes:        Alben Deeds, PT DPT  Board Certified Neurologic Specialist [place] 05/23/2018, 4:28 PM

## 2018-05-23 NOTE — Progress Notes (Signed)
OT Cancellation Note  Patient Details Name: Jared Hall MRN: [id number] DOB: [birth year]   Cancelled Treatment:    Reason Eval/Treat Not Completed: Patient at procedure or test/ unavailable(Vascular Lab)  [name] [name] 05/23/2018, 1:05 PM  [name] OTR/L [phone]

## 2018-05-23 NOTE — Progress Notes (Signed)
PROGRESS NOTE        PATIENT DETAILS Name: Jared Hall Age: 84 y.o. Sex: male Date of Birth: 1930/12/12 Admit Date: 05/22/2018 Admitting Physician Janora Norlander, MD MHW:KGSUP, [name], MD  Brief Narrative: Patient is a 84 y.o. male with prior history of CVA, diastolic heart failure, chronic venous stasis dermatitis, dyslipidemia, DM-2-presented with right arm numbness.  He was also found to have possible bilateral lower extremity cellulitis.  He was admitted for further evaluation and treatment.  Subjective: He claims that the numbness in his right upper extremity has significantly improved.  Continues to have erythema in bilateral lower extremities.  Assessment/Plan: Right arm numbness: Has resolved, CVA is doubted-but awaiting MRI.  Continue supportive care.  Dual antiplatelets and statin.  If MRI is positive for CVA then we can contemplate further work-up.  Bilateral lower extremity cellulitis: Suspect this is mild cellulitis or acute on chronic venous stasis dermatitis.  Stop clindamycin and levofloxacin.  Will start Ancef instead.  Continue to elevate legs, push diuretics-if Doppler is negative-we will ask nursing staff/wound care to wrap both legs.  Chronic kidney disease stage III: Creatinine close to usual baseline.  Follow.  History of CVA: Appears to have some dysarthria-remains on dual antiplatelets and statin.  Reviewed outpatient MD note (01/22/2017-Dr. Pandey)-has history of A. fib but no longer on anticoagulation due to fall risk  PAF: Sinus rhythm on EKG-on metoprolol-see above regarding anticoagulation.  Chronic diastolic heart failure: Appears to have some amount of mild lower extremity edema-but otherwise remains compensated.  Continue diuretics.  Insulin dependent DM-2: CBG stable-continue with Lantus 70 units twice daily and CBGs.  Dyslipidemia: Continue statin  Hypertension: Stable-continue with Hytrin, metoprolol  Chronic hypoxic  respiratory failure: Continue home O2  Peripheral neuropathy: Suspect related to DM-2, continue Elavil and Neurontin.  DVT Prophylaxis: Prophylactic Lovenox   Code Status: DNR  Family Communication: None at bedside  Disposition Plan: Remain inpatient-back to nursing facility in the next day or so once work-up is complete.  Antimicrobial agents: Anti-infectives (From admission, onward)   Start     Dose/Rate Route Frequency Ordered Stop   05/22/18 1800  clindamycin (CLEOCIN) capsule 300 mg     300 mg Oral Every 6 hours 05/22/18 1654 05/27/18 1759      Procedures: None  CONSULTS:  neurology  Time spent: 25- minutes-Greater than 50% of this time was spent in counseling, explanation of diagnosis, planning of further management, and coordination of care.  MEDICATIONS: Scheduled Meds: . amitriptyline  25 mg Oral QHS  . aspirin EC  81 mg Oral Daily  . B-complex with vitamin C  1 tablet Oral Daily  . clindamycin  300 mg Oral Q6H  . clopidogrel  75 mg Oral Daily  . enoxaparin (LOVENOX) injection  30 mg Subcutaneous Q24H  . fluticasone  1 spray Each Nare Daily  . furosemide  80 mg Oral BID  . gabapentin  200 mg Oral BID  . insulin aspart  0-20 Units Subcutaneous TID WC  . insulin glargine  70 Units Subcutaneous BID  . metoprolol tartrate  25 mg Oral BID  . pantoprazole  40 mg Oral Daily  . potassium chloride  20 mEq Oral Daily  . senna-docusate  1 tablet Oral BID  . simvastatin  20 mg Oral Daily  . terazosin  4 mg Oral Daily  . vitamin  B-12  1,000 mcg Oral Daily   Continuous Infusions: PRN Meds:.acetaminophen **OR** acetaminophen (TYLENOL) oral liquid 160 mg/5 mL **OR** acetaminophen, polyvinyl alcohol   PHYSICAL EXAM: Vital signs: Vitals:   05/22/18 2003 05/22/18 2340 05/23/18 0324 05/23/18 0820  BP: (!) 129/59 (!) 111/55 122/60 (!) 133/58  Pulse: 86 82 66 72  Resp: 18 18 18 18   Temp: (!) 97.5 F (36.4 C) 98 F (36.7 C) 97.9 F (36.6 C) 97.7 F (36.5 C)    TempSrc: Oral Oral Oral Oral  SpO2: 99% 99% 99% 100%   There were no vitals filed for this visit. There is no height or weight on file to calculate BMI.   General appearance :Awake, alert, not in any distress. Eyes:.Pink conjunctiva HEENT: Atraumatic and Normocephalic Neck: supple Resp:Good air entry bilaterally, no added sounds  CVS: S1 S2 regular  GI: Bowel sounds present, Non tender and not distended with no gaurding, rigidity or rebound.No organomegaly Extremities: B/L Lower Ext shows ++ edema, both legs are warm to touch Neurology: Moving all 4 extremities Musculoskeletal:No digital cyanosis Skin:No Rash, warm and dry Wounds:N/A  I have personally reviewed following labs and imaging studies  LABORATORY DATA: CBC: Recent Labs  Lab 05/22/18 1300 05/22/18 1310 05/23/18 0023  WBC 7.8  --  6.5  NEUTROABS 4.9  --   --   HGB 10.4* 11.9* 10.0*  HCT 35.9* 35.0* 34.5*  MCV 71.7*  --  71.6*  PLT 200  --  384    Basic Metabolic Panel: Recent Labs  Lab 05/22/18 1300 05/22/18 1310 05/23/18 0023  NA 141 139 140  K 3.7 3.7 3.7  CL 98 93* 97*  CO2 35*  --  34*  GLUCOSE 72 70 131*  BUN 13 16 13   CREATININE 1.79* 1.80* 1.82*  CALCIUM 9.4  --  9.0    GFR: CrCl cannot be calculated (Unknown ideal weight.).  Liver Function Tests: Recent Labs  Lab 05/22/18 1300  AST 31  ALT 22  ALKPHOS 63  BILITOT 0.9  PROT 6.4*  ALBUMIN 3.1*   No results for input(s): LIPASE, AMYLASE in the last 168 hours. No results for input(s): AMMONIA in the last 168 hours.  Coagulation Profile: Recent Labs  Lab 05/22/18 1300  INR 1.06    Cardiac Enzymes: Recent Labs  Lab 05/22/18 1835 05/23/18 0023  TROPONINI <0.03 <0.03    BNP (last 3 results) No results for input(s): PROBNP in the last 8760 hours.  HbA1C: Recent Labs    05/22/18 1837  HGBA1C 10.2*    CBG: Recent Labs  Lab 05/22/18 1721 05/22/18 2108 05/23/18 0604  GLUCAP 52* 126* 120*    Lipid  Profile: Recent Labs    05/23/18 0023  CHOL 123  HDL 26*  LDLCALC 63  TRIG 169*  CHOLHDL 4.7    Thyroid Function Tests: No results for input(s): TSH, T4TOTAL, FREET4, T3FREE, THYROIDAB in the last 72 hours.  Anemia Panel: No results for input(s): VITAMINB12, FOLATE, FERRITIN, TIBC, IRON, RETICCTPCT in the last 72 hours.  Urine analysis:    Component Value Date/Time   COLORURINE YELLOW 05/22/2018 1945   APPEARANCEUR HAZY (A) 05/22/2018 1945   LABSPEC 1.009 05/22/2018 1945   PHURINE 6.0 05/22/2018 1945   GLUCOSEU NEGATIVE 05/22/2018 1945   HGBUR MODERATE (A) 05/22/2018 1945   BILIRUBINUR NEGATIVE 05/22/2018 1945   KETONESUR NEGATIVE 05/22/2018 1945   PROTEINUR NEGATIVE 05/22/2018 1945   UROBILINOGEN 1.0 12/06/2014 1414   NITRITE NEGATIVE 05/22/2018 1945   LEUKOCYTESUR SMALL (  A) 05/22/2018 1945    Sepsis Labs: Lactic Acid, Venous    Component Value Date/Time   LATICACIDVEN 0.92 10/17/2017 1439    MICROBIOLOGY: No results found for this or any previous visit (from the past 240 hour(s)).  RADIOLOGY STUDIES/RESULTS: Dg Chest 2 View  Result Date: 05/22/2018 CLINICAL DATA:  Lower extremity edema, numbness in both hands history prostate cancer, type II diabetes mellitus, hypertension, CHF EXAM: CHEST - 2 VIEW COMPARISON:  10/17/2017 FINDINGS: Enlargement of cardiac silhouette. Atherosclerotic calcification aorta. Mediastinal contours and pulmonary vascularity normal. Subsegmental atelectasis at RIGHT base. Atelectasis and question coexistent consolidation at LEFT base. Upper lungs clear. Small LEFT pleural effusion. No pneumothorax or acute osseous findings. IMPRESSION: Enlargement of cardiac silhouette. RIGHT basilar atelectasis with atelectasis and suspect consolidation at LEFT base with small LEFT pleural effusion. Electronically Signed   By: Lavonia Dana M.D.   On: 05/22/2018 16:47   Ct Head Code Stroke Wo Contrast`  Result Date: 05/22/2018 CLINICAL DATA:  Code stroke.  Numbness or tingling. Right-sided weakness EXAM: CT HEAD WITHOUT CONTRAST TECHNIQUE: Contiguous axial images were obtained from the base of the skull through the vertex without intravenous contrast. COMPARISON:  10/17/2017 FINDINGS: Brain: No evidence of acute infarction, hemorrhage, hydrocephalus, extra-axial collection or mass lesion/mass effect. Small remote infarcts in the right cerebellum, right frontal parietal cortex, and high anterior right frontal subcortical white matter. Chronic small vessel ischemia with extensive low-density in the cerebral white matter. Cerebral volume loss. Incidental retro cerebellar CSF accumulation, right eccentric. Vascular: Atherosclerotic calcification.  No hyperdense vessel. Skull: Negative Sinuses/Orbits: Negative Other: These results were communicated to Dr. Cheral Marker at [place] 7/3/2019by text page via the [facility] messaging system. ASPECTS [facility] Stroke Program Early CT Score) - Ganglionic level infarction (caudate, lentiform nuclei, internal capsule, insula, M1-M3 cortex): 7 - Supraganglionic infarction (M4-M6 cortex): 3 Total score (0-10 with 10 being normal): 10 IMPRESSION: 1. No acute finding 2. Extensive chronic ischemic injury. Electronically Signed   By: Monte Fantasia M.D.   On: 05/22/2018 14:00     LOS: 1 day   Oren Binet, MD  [facility]  If 7PM-7AM, please contact night-coverage  Please page via [email or web address] [email or web address]-click on MD name and type text message  05/23/2018, 10:23 AM

## 2018-05-23 NOTE — Progress Notes (Signed)
SLP Cancellation Note  Patient Details Name: Jared Hall MRN: [id number] DOB: [birth year]   Cancelled treatment:       Reason Eval/Treat Not Completed: Patient declined, no reason specified;  Difficult to arouse and irritable, asking to return.     [name] 05/23/2018, 12:05 PM

## 2018-05-23 NOTE — Evaluation (Signed)
Occupational Therapy Evaluation Patient Details Name: Jared Hall MRN: [id number] DOB: 24-Dec-1930 Today's Date: 05/23/2018    History of Present Illness Jared Hall is a 84 y.o. male with medical history significant of CVA (no residual deficits), diastolic CHF, HTN, dyslipidemia, prostate CA s/p XRT, OSA, HOH, T2DM who presented to ED from [facility] with RUE numbness.    Clinical Impression   PTA Pt reports that he was mod I for ADL at [facility] level - doing own toilet transfers and peri care - but getting help for bathing x2 a week. Pt is currently mod A for ADL due to cognition and generalized weakness. Able to perform SPT to recliner with min A for safety and assist (and drop arm) Pt will require wide equipment. OT will continue to follow acutely - and will require SNF level therapy post-acute to focus on RUE and functional independence for ADL/transfers.    Follow Up Recommendations  SNF;Supervision/Assistance - 24 hour    Equipment Recommendations  None recommended by OT    Recommendations for Other Services       Precautions / Restrictions Precautions Precautions: Fall Restrictions Weight Bearing Restrictions: No      Mobility Bed Mobility Overal bed mobility: Needs Assistance Bed Mobility: Rolling;Supine to Sit Rolling: Min assist   Supine to sit: Min assist     General bed mobility comments: Min assist for safety due to impulsivity. HOB elevated  Transfers Overall transfer level: Needs assistance   Transfers: Sit to/from [employer] Transfers Sit to Stand: Min assist   Squat pivot transfers: Min assist     General transfer comment: Min assist (+2) to pivot to chair, and power up from chair (did not reach full upright positioning)(to drop arm recliner)    Balance Overall balance assessment: Needs assistance Sitting-balance support: Feet supported Sitting balance-Leahy Scale: Fair Sitting balance - Comments: able to sit EOB and in chair, some dynamic  movements tolerated without assist                                   ADL either performed or assessed with clinical judgement   ADL Overall ADL's : Needs assistance/impaired Eating/Feeding: Set up;Sitting   Grooming: Set up;Wash/dry face;Sitting Grooming Details (indicate cue type and reason): in recliner Upper Body Bathing: Moderate assistance;Sitting   Lower Body Bathing: Maximal assistance;Sit to/from stand   Upper Body Dressing : Moderate assistance;Sitting   Lower Body Dressing: Maximal assistance;Sit to/from stand Lower Body Dressing Details (indicate cue type and reason): total A to don socks, able to bring hips off recliner for LB dressing Toilet Transfer: Minimal assistance;Squat-pivot;BSC;Requires wide/bariatric Toilet Transfer Details (indicate cue type and reason): simulated through recliner transfer [place] and Hygiene: Maximal assistance;Sit to/from stand         General ADL Comments: Pt mod I at baseline, currently mod A due to cognition and deficits     Vision         Perception     Praxis      Pertinent Vitals/Pain Pain Assessment: No/denies pain     Hand Dominance Right   Extremity/Trunk Assessment Upper Extremity Assessment Upper Extremity Assessment: Generalized weakness;RUE deficits/detail RUE Deficits / Details: RUE less interactive than L during functional ADL grooming tasks; shoulder flexion limited to 45 degrees RUE Sensation: decreased proprioception RUE Coordination: decreased gross motor   Lower Extremity Assessment Lower Extremity Assessment: Defer to PT evaluation  Cervical / Trunk Assessment Cervical / Trunk Assessment: (increased body habitus)   Communication Communication Communication: HOH;Expressive difficulties(extremely HOH; slurring of speech)   Cognition Arousal/Alertness: Awake/alert Behavior During Therapy: WFL for tasks assessed/performed;Impulsive Overall Cognitive Status:  Impaired/Different from baseline Area of Impairment: Attention;Orientation;Awareness;Problem solving                 Orientation Level: Disoriented to;Place;Time;Situation Current Attention Level: Sustained       Awareness: Emergent Problem Solving: Slow processing;Difficulty sequencing;Requires verbal cues;Requires tactile cues General Comments: pleasently confused during session   General Comments  noted redness and erythema around multiple spots BLEs.    Exercises     Shoulder Instructions      Home Living Family/patient expects to be discharged to:: Skilled nursing facility                                 Additional Comments: [facility]      Prior Functioning/Environment Level of Independence: Needs assistance  Gait / Transfers Assistance Needed: transfers to [facility] independently, self propels WC to dining room, dresses self; pt stated he's been non ambulatory for 5 years ADL's / Homemaking Assistance Needed: assist to bathe   Comments: pt states he was mostly mod I with adls. Staff assists with showers 2x week.          OT Problem List: Decreased strength;Decreased activity tolerance;Impaired balance (sitting and/or standing);Decreased coordination;Obesity;Impaired UE functional use;Increased edema      OT Treatment/Interventions: Self-care/ADL training;Neuromuscular education;DME and/or AE instruction;Therapeutic activities;Cognitive remediation/compensation;Patient/family education;Balance training    OT Goals(Current goals can be found in the care plan section) Acute Rehab OT Goals Patient Stated Goal: stay in the chair OT Goal Formulation: With patient Time For Goal Achievement: 06/06/18 Potential to Achieve Goals: Good ADL Goals Pt Will Perform Grooming: with modified independence;sitting Pt Will Perform Upper Body Dressing: with supervision;sitting Pt Will Perform Lower Body Dressing: sit to/from stand;with supervision Pt Will  Transfer to Toilet: with supervision;squat pivot transfer Pt Will Perform Toileting - Clothing Manipulation and hygiene: with supervision;sitting/lateral leans  OT Frequency: Min 2X/week   Barriers to D/C:            Co-evaluation PT/OT/SLP Co-Evaluation/Treatment: Yes Reason for Co-Treatment: Complexity of the patient's impairments (multi-system involvement);For patient/therapist safety;Necessary to address cognition/behavior during functional activity;To address functional/ADL transfers PT goals addressed during session: Mobility/safety with mobility;Balance OT goals addressed during session: ADL's and self-care      AM-PAC PT "6 Clicks" Daily Activity     Outcome Measure Help from another person eating meals?: A Little Help from another person taking care of personal grooming?: A Little Help from another person toileting, which includes using toliet, bedpan, or urinal?: A Lot Help from another person bathing (including washing, rinsing, drying)?: A Lot Help from another person to put on and taking off regular upper body clothing?: A Little Help from another person to put on and taking off regular lower body clothing?: A Lot 6 Click Score: 15   End of Session Equipment Utilized During Treatment: Oxygen Nurse Communication: Mobility status  Activity Tolerance: Patient tolerated treatment well Patient left: in chair;with call bell/phone within reach;with chair alarm set;with nursing/sitter in room  OT Visit Diagnosis: Muscle weakness (generalized) (M62.81);Other symptoms and signs involving cognitive function;Other symptoms and signs involving the nervous system (P37.902)                Time: 4097-3532 OT Time  Calculation (min): 24 min Charges:  OT General Charges $OT Visit: 1 Visit OT Evaluation $OT Eval Moderate Complexity: 1 Mod G-Codes:     [name] OTR/L [place] 05/23/2018, 4:56 PM

## 2018-05-24 ENCOUNTER — Inpatient Hospital Stay (HOSPITAL_COMMUNITY): Payer: Medicare Other

## 2018-05-24 ENCOUNTER — Encounter (HOSPITAL_COMMUNITY): Payer: Medicare Other

## 2018-05-24 DIAGNOSIS — R471 Dysarthria and anarthria: Secondary | ICD-10-CM

## 2018-05-24 LAB — GLUCOSE, CAPILLARY
Glucose-Capillary: 127 mg/dL — ABNORMAL HIGH (ref 70–99)
Glucose-Capillary: 179 mg/dL — ABNORMAL HIGH (ref 70–99)
Glucose-Capillary: 229 mg/dL — ABNORMAL HIGH (ref 70–99)
Glucose-Capillary: 198 mg/dL — ABNORMAL HIGH (ref 70–99)
Glucose-Capillary: 173 mg/dL — ABNORMAL HIGH (ref 70–99)

## 2018-05-24 LAB — BASIC METABOLIC PANEL
Anion gap: 8 (ref 5–15)
BUN: 14 mg/dL (ref 8–23)
CO2: 35 mmol/L — ABNORMAL HIGH (ref 22–32)
CREATININE: 1.74 mg/dL — AB (ref 0.61–1.24)
Calcium: 9.2 mg/dL (ref 8.9–10.3)
Chloride: 96 mmol/L — ABNORMAL LOW (ref 98–111)
GFR, EST AFRICAN AMERICAN: 39 mL/min — AB (ref >60)
GFR, EST NON AFRICAN AMERICAN: 34 mL/min — AB (ref >60)
Glucose, Bld: 166 mg/dL — ABNORMAL HIGH (ref 70–99)
POTASSIUM: 3.8 mmol/L (ref 3.5–5.1)
SODIUM: 139 mmol/L (ref 135–145)

## 2018-05-24 MED ORDER — CEPHALEXIN 500 MG PO CAPS: 500.0000 mg | ORAL_CAPSULE | Freq: Three times a day (TID) | ORAL | 0 refills | Status: DC

## 2018-05-24 MED ORDER — DOXYCYCLINE HYCLATE 100 MG PO TABS: 100.0000 mg | ORAL_TABLET | Freq: Two times a day (BID) | ORAL | 0 refills | Status: DC

## 2018-05-24 MED ORDER — TRAMADOL HCL 50 MG PO TABS: 50.0000 mg | ORAL_TABLET | Freq: Four times a day (QID) | ORAL | 0 refills | Status: AC | PRN

## 2018-05-24 MED ORDER — GABAPENTIN 100 MG PO CAPS
200.0000 mg | ORAL_CAPSULE | Freq: Two times a day (BID) | ORAL | 0 refills | Status: DC
Start: 2018-05-24 — End: 2019-06-14

## 2018-05-24 NOTE — Progress Notes (Signed)
Pt continue to await on PTAR to transport off to disposition. Pt stable and report given off to oncoming RN. Delia Heady RN

## 2018-05-24 NOTE — Progress Notes (Signed)
Patient discharged to [facility] via  stretcher by PTAR at [phone] after medication administration, Pt stable on discharge, on 2L/min [place], V/S stable

## 2018-05-24 NOTE — Progress Notes (Signed)
Inpatient Diabetes Program Recommendations  AACE/ADA: New Consensus Statement on Inpatient Glycemic Control (2015)  Target Ranges:  Prepandial:   less than 140 mg/dL      Peak postprandial:   less than 180 mg/dL (1-2 hours)      Critically ill patients:  140 - 180 mg/dL   Review of Glycemic Control  Inpatient Diabetes Program Recommendations:    A1c 10.2% this admission. Noted patient is from SNF, [place]. Patient reports that he does not have any outside food or drink brought in. Patient is on Lantus 85 units BID and Novolog 4-15 units BID with meals outpatient. Not sure why A1c is so elevated as his glucose is controlled on current inpatient regimen Lantus 70 units BID, Novolog 0-20 units tid.  Thanks,  Tama Headings RN, MSN, BC-ADM, [facility] Inpatient Diabetes Coordinator Team Pager [phone] (8a-5p)

## 2018-05-24 NOTE — Clinical Social Work Note (Signed)
Clinical Social Work Assessment  Patient Details  Name: Jared Hall MRN: [id number] Date of Birth: 07-26-1931  Date of referral:  05/24/18               Reason for consult:  Facility Placement, Discharge Planning                Permission sought to share information with:  Facility Sport and exercise psychologist, Family Supports Permission granted to share information::  No(unable to communicate effectively with patient d/t dysarthria and hearing impairment)  Name::     [employer]  Agency::  [facility]  Relationship::     Contact Information:  [phone]  Housing/Transportation Living arrangements for the past 2 months:  [place] of Information:  Facility, Medical Team, Other (Comment Required) Patient Interpreter Needed:  None Criminal Activity/Legal Involvement Pertinent to Current Situation/Hospitalization:  No - Comment as needed Significant Relationships:  Other Family Members, Adult Children Lives with:  Facility Resident Do you feel safe going back to the place where you live?  Yes Need for family participation in patient care:  Yes (Comment)  Care giving concerns: Patient from [facility]. PT recommending SNF, however patient wheelchair bound at baseline.   Social Worker assessment / plan: CSW spoke to [employer], [place], and faxed over patient's clinicals. ALF clinical director reviewed and indicated that based on patient's baseline needs for assistance and his therapy evaluations, ALF would be able to manage patient's needs. ALF will provide PT/OT in house. CM faxed PT/OT orders to facility.   CSW spoke to patient's relative, [name], on the phone. CSW updated [name] on PT recommendations. [name] reported patient uses wheelchair at baseline and is also agreeable for patient to return to ALF. CSW did meet with patient at bedside. However, patient sleeping and difficult to arouse. When awakened, difficult to communicate with patient due  to patient's speech difficulties.   CSW to support with discharge back to ALF today.  Employment status:  Retired Forensic scientist:  Medicare PT Recommendations:  [place] / Referral to community resources:  Other (Comment Required)(ALF)  Patient/Family's Response to care: Patient family appreciative of care.  Patient/Family's Understanding of and Emotional Response to Diagnosis, Current Treatment, and Prognosis: Facility staff and family with good understanding of patient's needs and all in agreement for patient to return to ALF.  Emotional Assessment Appearance:  Appears stated age Attitude/Demeanor/Rapport:  Lethargic Affect (typically observed):  Unable to Assess Orientation:  Oriented to Self, Oriented to Place, Oriented to  Time, Oriented to Situation Alcohol / Substance use:  Not Applicable Psych involvement (Current and /or in the community):  No (Comment)  Discharge Needs  Concerns to be addressed:  Care Coordination, Discharge Planning Concerns Readmission within the last 30 days:  No Current discharge risk:  Physical Impairment Barriers to Discharge:  No Barriers Identified   Estanislado Emms, LCSW 05/24/2018, 1:59 PM

## 2018-05-24 NOTE — Discharge Summary (Signed)
PATIENT DETAILS Name: Jared Hall Age: 84 y.o. Sex: male Date of Birth: 11/17/1931 MRN: [id number]. Admitting Physician: Janora Norlander, MD YCX:[id number], Jared [place], MD  Admit Date: 05/22/2018 Discharge date: 05/24/2018  Recommendations for Outpatient Follow-up:  1. Follow up with PCP in 1-2 weeks 2. Please obtain BMP/CBC in one week  Admitted From:  ALF   Disposition: ALF with PT/OT  Home Health: Yes  Equipment/Devices:None  Discharge Condition: Stable  CODE STATUS: DNR  Diet recommendation:  Heart Healthy / Carb Modified  Brief Summary: See H&P, Labs, Consult and Test reports for all details in brief, Patient is a 84 y.o. male with prior history of CVA, diastolic heart failure, chronic venous stasis dermatitis, dyslipidemia, DM-2-presented with right arm numbness.  He was also found to have possible bilateral lower extremity cellulitis.  He was admitted for further evaluation and treatment.   Brief Hospital Course: Right arm numbness: Essentially resolved, MRI of the brain did not show any acute CVA.  Doubt any further work-up is required as low suspicion for CVA.  Continue dual antiplatelets and statin.  By rehab services, felt to require SNF.  Following-likely SNF on 7/6, as he needs 3 midnight stay.    Bilateral lower extremity cellulitis: Suspect this is mild cellulitis or acute on chronic venous stasis dermatitis.  Markedly better with Ancef, continue to elevate both legs, add compression stockings.  Patient refused a lower extremity Doppler.  Since clinically improved-we will transition to doxycycline for 5 more days.  Chronic kidney disease stage III:  Close to usual baseline, follow.   History of CVA: Appears to have some dysarthria-remains on dual antiplatelets and statin.  MRI of the brain this admission negative for acute CVA.  Reviewed outpatient MD note (01/22/2017-Dr. Pandey)-has history of A. fib but no longer on anticoagulation due to fall risk  PAF:  Telemetry shows sinus rhythm, continue metoprolol-see above regarding anticoagulation.    Chronic diastolic heart failure: Appears to have some amount of mild lower extremity edema-is probably secondary to chronic venous stasis changes-he otherwise remains compensated.  Continue with diuretics.    Insulin dependent DM-2: CBG stable, continue with Lantus-follow closely at SNF  Dyslipidemia: Continue statin  Hypertension: Stable-continue with Hytrin, metoprolol  Chronic hypoxic respiratory failure: Continue home O2  Peripheral neuropathy: Suspect related to DM-2, continue Elavil and Neurontin.  Procedures/Studies: None  Discharge Diagnoses:  Active Problems:   History of CVA (cerebrovascular accident)   Dyslipidemia   AKI (acute kidney injury) (Jared Hall)   Essential hypertension, benign   Diabetic neuropathy ([place])   DM type 2 with diabetic peripheral neuropathy (HCC)   Dysarthria   Paroxysmal atrial fibrillation (HCC)   Chronic kidney disease, stage III (moderate) (HCC)   Hard of hearing   Anemia   TIA (transient ischemic attack)   Cellulitis in diabetic foot [facility])   Discharge Instructions:  Activity:  As tolerated with Full fall precautions use walker/cane & assistance as needed   Discharge Instructions    (HEART FAILURE PATIENTS) Call MD:  Anytime you have any of the following symptoms: 1) 3 pound weight gain in 24 hours or 5 pounds in 1 week 2) shortness of breath, with or without a dry hacking cough 3) swelling in the hands, feet or stomach 4) if you have to sleep on extra pillows at night in order to breathe.   Complete by:  As directed    Call MD for:  redness, tenderness, or signs of infection (pain, swelling, redness, odor or green/yellow  discharge around incision site)   Complete by:  As directed    Diet - low sodium heart healthy   Complete by:  As directed    Increase activity slowly   Complete by:  As directed      Allergies as of 05/24/2018       Reactions   Ativan [lorazepam] Other (See Comments)   Flailing of extremities noted immediately s/p IV administration.   Flomax [tamsulosin Hcl] Other (See Comments)   Excessive tearing   Glucophage [metformin Hcl] Diarrhea      Medication List    STOP taking these medications   hydrocerin Crea   levofloxacin 500 MG tablet Commonly known as:  LEVAQUIN     TAKE these medications   amitriptyline 25 MG tablet Commonly known as:  ELAVIL Take 1 tablet (25 mg total) by mouth at bedtime.   aspirin EC 81 MG tablet Take 81 mg by mouth daily.   azelastine 0.05 % ophthalmic solution Commonly known as:  OPTIVAR Place 1 drop into both eyes 2 (two) times daily as needed.   clopidogrel 75 MG tablet Commonly known as:  PLAVIX Take 75 mg by mouth daily.   doxycycline 100 MG tablet Commonly known as:  VIBRA-TABS Take 1 tablet (100 mg total) by mouth 2 (two) times daily.   fluticasone 50 MCG/ACT nasal spray Commonly known as:  FLONASE Place 1 spray into both nostrils daily.   furosemide 80 MG tablet Commonly known as:  LASIX Take 80 mg by mouth 2 (two) times daily.   gabapentin 100 MG capsule Commonly known as:  NEURONTIN Take 2 capsules (200 mg total) by mouth 2 (two) times daily. Hold for sedation. What changed:    medication strength  how much to take   GENTEAL TEARS NIGHT-TIME Oint Place 1 application into the right eye See admin instructions. PULL DOWN THE LOWER RIGHT EYELID AND APPLY A SMALL AMOUNT THREE TIMES DAILY UNTILL COMFORTABLE   insulin aspart 100 UNIT/ML injection Commonly known as:  novoLOG Inject 4-15 Units into the skin 2 (two) times daily. Blood sugar < 150 = 0 units 151-200 = 7 units 201-250 = 9 units 251-300 = 11 units 301-350 = 13 units 351-400 = 15 units >400 = 18 units   insulin glargine 100 UNIT/ML injection Commonly known as:  LANTUS Inject 0.5 mLs (50 Units total) into the skin 2 (two) times daily. What changed:  how much to take     metoprolol tartrate 25 MG tablet Commonly known as:  LOPRESSOR Take 1 tablet (25 mg total) by mouth 2 (two) times daily.   neomycin-bacitracin-polymyxin ointment Commonly known as:  NEOSPORIN Apply 1 application topically daily.   pantoprazole 40 MG tablet Commonly known as:  PROTONIX Take 40 mg by mouth daily.   potassium chloride 20 MEQ packet Commonly known as:  KLOR-CON Take 20 mEq by mouth daily.   senna-docusate 8.6-50 MG tablet Commonly known as:  Senokot-S Take 1 tablet by mouth 2 (two) times daily.   simvastatin 20 MG tablet Commonly known as:  ZOCOR Take 20 mg by mouth daily.   terazosin 2 MG capsule Commonly known as:  HYTRIN Take 4 mg by mouth daily. Take two 2-mg tablets to = 4 mg   traMADol 50 MG tablet Commonly known as:  ULTRAM Take 1 tablet (50 mg total) by mouth every 6 (six) hours as needed for moderate pain. What changed:    how much to take  when to take this  reasons  to take this   VITAMIN B COMPLEX PO Take 1 tablet by mouth daily.   vitamin B-12 1000 MCG tablet Commonly known as:  CYANOCOBALAMIN Take 1,000 mcg by mouth daily.      Follow-up Information    Josetta Huddle, MD. Schedule an appointment as soon as possible for a visit in 1 week(s).   Specialty:  Internal Medicine Contact information: [address] [place] [address] [phone]          Allergies  Allergen Reactions  . Ativan [Lorazepam] Other (See Comments)    Flailing of extremities noted immediately s/p IV administration.  [name] Kitchen Flomax [Tamsulosin Hcl] Other (See Comments)    Excessive tearing  . Glucophage [Metformin Hcl] Diarrhea    Consultations:   neurology  Other Procedures/Studies: Dg Chest 2 View  Result Date: 05/22/2018 CLINICAL DATA:  Lower extremity edema, numbness in both hands history prostate cancer, type II diabetes mellitus, hypertension, CHF EXAM: CHEST - 2 VIEW COMPARISON:  10/17/2017 FINDINGS: Enlargement of cardiac  silhouette. Atherosclerotic calcification aorta. Mediastinal contours and pulmonary vascularity normal. Subsegmental atelectasis at RIGHT base. Atelectasis and question coexistent consolidation at LEFT base. Upper lungs clear. Small LEFT pleural effusion. No pneumothorax or acute osseous findings. IMPRESSION: Enlargement of cardiac silhouette. RIGHT basilar atelectasis with atelectasis and suspect consolidation at LEFT base with small LEFT pleural effusion. Electronically Signed   By: Lavonia Dana M.D.   On: 05/22/2018 16:47   Mr Brain Wo Contrast  Result Date: 05/24/2018 CLINICAL DATA:  Right arm numbness. EXAM: MRI HEAD WITHOUT CONTRAST TECHNIQUE: Multiplanar, multiecho pulse sequences of the brain and surrounding structures were obtained without intravenous contrast. COMPARISON:  02/13/2017 brain MRI.  Head CT 05/22/2018 FINDINGS: Brain: No acute infarction, hemorrhage, hydrocephalus, extra-axial collection or mass lesion. Confluent gliosis in the deep cerebral white matter attributed to chronic small vessel ischemia. Patchy superimposed chronic lacunar infarcts in the centrum semiovale with superimposed dilated perivascular spaces. Small remote right more than left cerebellar infarcts. Small remote right parietal cortex infarct. Remote hemorrhage at 2 of the chronic lacunes. Incidental retro cerebellar CSF accumulation. Vascular: Major flow voids are preserved Skull and upper cervical spine: No evidence of marrow lesion Sinuses/Orbits: Bilateral cataract resection. IMPRESSION: 1. No acute finding. 2. Extensive chronic small vessel ischemia. Electronically Signed   By: Monte Fantasia M.D.   On: 05/24/2018 10:37   Ct Head Code Stroke Wo Contrast`  Result Date: 05/22/2018 CLINICAL DATA:  Code stroke. Numbness or tingling. Right-sided weakness EXAM: CT HEAD WITHOUT CONTRAST TECHNIQUE: Contiguous axial images were obtained from the base of the skull through the vertex without intravenous contrast. COMPARISON:   10/17/2017 FINDINGS: Brain: No evidence of acute infarction, hemorrhage, hydrocephalus, extra-axial collection or mass lesion/mass effect. Small remote infarcts in the right cerebellum, right frontal parietal cortex, and high anterior right frontal subcortical white matter. Chronic small vessel ischemia with extensive low-density in the cerebral white matter. Cerebral volume loss. Incidental retro cerebellar CSF accumulation, right eccentric. Vascular: Atherosclerotic calcification.  No hyperdense vessel. Skull: Negative Sinuses/Orbits: Negative Other: These results were communicated to Dr. Cheral Marker at [place] 7/3/2019by text page via the [facility] messaging system. ASPECTS [facility] Stroke Program Early CT Score) - Ganglionic level infarction (caudate, lentiform nuclei, internal capsule, insula, M1-M3 cortex): 7 - Supraganglionic infarction (M4-M6 cortex): 3 Total score (0-10 with 10 being normal): 10 IMPRESSION: 1. No acute finding 2. Extensive chronic ischemic injury. Electronically Signed   By: Monte Fantasia M.D.   On: 05/22/2018 14:00  TODAY-DAY OF DISCHARGE:  Subjective:   Jared Hall today has no headache,no chest abdominal pain,no new weakness tingling or numbness Objective:   Blood pressure (!) 151/53, pulse 69, temperature 97.7 F (36.5 C), temperature source Oral, resp. rate 18, height 5\' 10"  (1.778 m), weight 122.9 kg (270 lb 15.1 oz), SpO2 100 %.  Intake/Output Summary (Last 24 hours) at 05/24/2018 1228 Last data filed at 05/24/2018 0900 Gross per 24 hour  Intake 220 ml  Output 2400 ml  Net -2180 ml   Filed Weights   05/23/18 2135  Weight: 122.9 kg (270 lb 15.1 oz)    Exam: Awake Alert, No new F.N deficits, Normal affect [place].AT,PERRAL Supple Neck,No JVD, No cervical lymphadenopathy appriciated.  Symmetrical Chest wall movement, Good air movement bilaterally, CTAB RRR,No Gallops,Rubs or new Murmurs, No Parasternal Heave +ve B.Sounds, Abd Soft, Non tender, No organomegaly  appriciated, No rebound -guarding or rigidity. No Cyanosis, Clubbing or edema, No new Rash or bruise   PERTINENT RADIOLOGIC STUDIES: Dg Chest 2 View  Result Date: 05/22/2018 CLINICAL DATA:  Lower extremity edema, numbness in both hands history prostate cancer, type II diabetes mellitus, hypertension, CHF EXAM: CHEST - 2 VIEW COMPARISON:  10/17/2017 FINDINGS: Enlargement of cardiac silhouette. Atherosclerotic calcification aorta. Mediastinal contours and pulmonary vascularity normal. Subsegmental atelectasis at RIGHT base. Atelectasis and question coexistent consolidation at LEFT base. Upper lungs clear. Small LEFT pleural effusion. No pneumothorax or acute osseous findings. IMPRESSION: Enlargement of cardiac silhouette. RIGHT basilar atelectasis with atelectasis and suspect consolidation at LEFT base with small LEFT pleural effusion. Electronically Signed   By: Lavonia Dana M.D.   On: 05/22/2018 16:47   Mr Brain Wo Contrast  Result Date: 05/24/2018 CLINICAL DATA:  Right arm numbness. EXAM: MRI HEAD WITHOUT CONTRAST TECHNIQUE: Multiplanar, multiecho pulse sequences of the brain and surrounding structures were obtained without intravenous contrast. COMPARISON:  02/13/2017 brain MRI.  Head CT 05/22/2018 FINDINGS: Brain: No acute infarction, hemorrhage, hydrocephalus, extra-axial collection or mass lesion. Confluent gliosis in the deep cerebral white matter attributed to chronic small vessel ischemia. Patchy superimposed chronic lacunar infarcts in the centrum semiovale with superimposed dilated perivascular spaces. Small remote right more than left cerebellar infarcts. Small remote right parietal cortex infarct. Remote hemorrhage at 2 of the chronic lacunes. Incidental retro cerebellar CSF accumulation. Vascular: Major flow voids are preserved Skull and upper cervical spine: No evidence of marrow lesion Sinuses/Orbits: Bilateral cataract resection. IMPRESSION: 1. No acute finding. 2. Extensive chronic small  vessel ischemia. Electronically Signed   By: Monte Fantasia M.D.   On: 05/24/2018 10:37   Ct Head Code Stroke Wo Contrast`  Result Date: 05/22/2018 CLINICAL DATA:  Code stroke. Numbness or tingling. Right-sided weakness EXAM: CT HEAD WITHOUT CONTRAST TECHNIQUE: Contiguous axial images were obtained from the base of the skull through the vertex without intravenous contrast. COMPARISON:  10/17/2017 FINDINGS: Brain: No evidence of acute infarction, hemorrhage, hydrocephalus, extra-axial collection or mass lesion/mass effect. Small remote infarcts in the right cerebellum, right frontal parietal cortex, and high anterior right frontal subcortical white matter. Chronic small vessel ischemia with extensive low-density in the cerebral white matter. Cerebral volume loss. Incidental retro cerebellar CSF accumulation, right eccentric. Vascular: Atherosclerotic calcification.  No hyperdense vessel. Skull: Negative Sinuses/Orbits: Negative Other: These results were communicated to Dr. Cheral Marker at [place] 7/3/2019by text page via the [facility] messaging system. ASPECTS [facility] Stroke Program Early CT Score) - Ganglionic level infarction (caudate, lentiform nuclei, internal capsule, insula, M1-M3 cortex): 7 - Supraganglionic infarction (M4-M6 cortex): 3  Total score (0-10 with 10 being normal): 10 IMPRESSION: 1. No acute finding 2. Extensive chronic ischemic injury. Electronically Signed   By: Monte Fantasia M.D.   On: 05/22/2018 14:00     PERTINENT LAB RESULTS: CBC: Recent Labs    05/22/18 1300 05/22/18 1310 05/23/18 0023  WBC 7.8  --  6.5  HGB 10.4* 11.9* 10.0*  HCT 35.9* 35.0* 34.5*  PLT 200  --  191   CMET CMP     Component Value Date/Time   NA 139 05/24/2018 0417   NA 135 (A) 01/24/2017   K 3.8 05/24/2018 0417   CL 96 (L) 05/24/2018 0417   CO2 35 (H) 05/24/2018 0417   GLUCOSE 166 (H) 05/24/2018 0417   BUN 14 05/24/2018 0417   BUN 15 01/24/2017   CREATININE 1.74 (H) 05/24/2018 0417   CALCIUM 9.2  05/24/2018 0417   PROT 6.4 (L) 05/22/2018 1300   ALBUMIN 3.1 (L) 05/22/2018 1300   AST 31 05/22/2018 1300   ALT 22 05/22/2018 1300   ALKPHOS 63 05/22/2018 1300   BILITOT 0.9 05/22/2018 1300   GFRNONAA 34 (L) 05/24/2018 0417   GFRAA 39 (L) 05/24/2018 0417    GFR Estimated Creatinine Clearance: 40.8 mL/min (A) (by C-G formula based on SCr of 1.74 mg/dL (H)). No results for input(s): LIPASE, AMYLASE in the last 72 hours. Recent Labs    05/22/18 1835 05/23/18 0023  TROPONINI <0.03 <0.03   Invalid input(s): POCBNP No results for input(s): DDIMER in the last 72 hours. Recent Labs    05/22/18 1837  HGBA1C 10.2*   Recent Labs    05/23/18 0023  CHOL 123  HDL 26*  LDLCALC 63  TRIG 169*  CHOLHDL 4.7   No results for input(s): TSH, T4TOTAL, T3FREE, THYROIDAB in the last 72 hours.  Invalid input(s): FREET3 No results for input(s): VITAMINB12, FOLATE, FERRITIN, TIBC, IRON, RETICCTPCT in the last 72 hours. Coags: Recent Labs    05/22/18 1300  INR 1.06   Microbiology: Recent Results (from the past 240 hour(s))  Culture, blood (routine x 2)     Status: None (Preliminary result)   Collection Time: 05/22/18  6:25 PM  Result Value Ref Range Status   Specimen Description BLOOD LEFT ANTECUBITAL  Final   Special Requests   Final    BOTTLES DRAWN AEROBIC ONLY Blood Culture adequate volume   Culture   Final    NO GROWTH 2 DAYS Performed at [facility], [address] [place], [place] [address]    Report Status PENDING  Incomplete  Culture, blood (routine x 2)     Status: None (Preliminary result)   Collection Time: 05/22/18  6:30 PM  Result Value Ref Range Status   Specimen Description BLOOD LEFT ANTECUBITAL  Final   Special Requests   Final    BOTTLES DRAWN AEROBIC ONLY Blood Culture adequate volume   Culture   Final    NO GROWTH 2 DAYS Performed at [facility], [place] [address] [place], [place] [address]    Report Status PENDING  Incomplete  MRSA PCR  Screening     Status: Abnormal   Collection Time: 05/23/18 11:04 AM  Result Value Ref Range Status   MRSA by PCR POSITIVE (A) NEGATIVE Final    Comment:        The GeneXpert MRSA Assay (FDA approved for NASAL specimens only), is one component of a comprehensive MRSA colonization surveillance program. It is not intended to diagnose MRSA infection  nor to guide or monitor treatment for MRSA infections. RESULT CALLED TO, READ BACK BY AND VERIFIED WITH: Alben Spittle RN 14:40 05/23/18 ([email or web address]) Performed at [facility], [place] [address], [place], [place] [address]     FURTHER DISCHARGE INSTRUCTIONS:  Get Medicines reviewed and adjusted: Please take all your medications with you for your next visit with your Primary MD  Laboratory/radiological data: Please request your Primary MD to go over all hospital tests and procedure/radiological results at the follow up, please ask your Primary MD to get all Hospital records sent to his/her office.  In some cases, they will be blood work, cultures and biopsy results pending at the time of your discharge. Please request that your primary care M.D. goes through all the records of your hospital data and follows up on these results.  Also Note the following: If you experience worsening of your admission symptoms, develop shortness of breath, life threatening emergency, suicidal or homicidal thoughts you must seek medical attention immediately by calling 911 or calling your MD immediately  if symptoms less severe.  You must read complete instructions/literature along with all the possible adverse reactions/side effects for all the Medicines you take and that have been prescribed to you. Take any new Medicines after you have completely understood and accpet all the possible adverse reactions/side effects.   Do not drive when taking Pain medications or sleeping medications (Benzodaizepines)  Do not take more than prescribed Pain, Sleep and Anxiety  Medications. It is not advisable to combine anxiety,sleep and pain medications without talking with your primary care practitioner  Special Instructions: If you have smoked or chewed Tobacco  in the last 2 yrs please stop smoking, stop any regular Alcohol  and or any Recreational drug use.  Wear Seat belts while driving.  Please note: You were cared for by a hospitalist during your hospital stay. Once you are discharged, your primary care physician will handle any further medical issues. Please note that NO REFILLS for any discharge medications will be authorized once you are discharged, as it is imperative that you return to your primary care physician (or establish a relationship with a primary care physician if you do not have one) for your post hospital discharge needs so that they can reassess your need for medications and monitor your lab values.  Total Time spent coordinating discharge including counseling, education and face to face time equals  45 minutes.  SignedOren Binet 05/24/2018 12:28 PM

## 2018-05-24 NOTE — Progress Notes (Signed)
Patient will discharge back to [facility]. Anticipated discharge date: 05/24/18 Family notified: [name] Transportation by: Corey Harold  Nurse to call report to [phone].   CSW signing off.  Estanislado Emms, [place]  Clinical Social Worker

## 2018-05-24 NOTE — NC FL2 (Signed)
[place] MEDICAID FL2 LEVEL OF CARE SCREENING TOOL     IDENTIFICATION  Patient Name: Jared Hall Birthdate: 1931/08/25 Sex: male Admission Date (Current Location): 05/22/2018  [facility] and [place] Number:  Herbalist and Address:  [facility] [place] [place] [address] [place], [place] [address]      Provider Number: [id number]  Attending Physician Name and Address:  Jonetta Osgood, MD  Relative Name and Phone Number:  Jobani Sabado, son, [phone]    Current Level of Care: Hospital Recommended Level of Care: [place] Prior Approval Number:    Date Approved/Denied:   PASRR Number: [id number]  Discharge Plan: Domiciliary (Rest home)(ALF)    Current Diagnoses: Patient Active Problem List   Diagnosis Date Noted  . TIA (transient ischemic attack) 05/22/2018  . Cellulitis in diabetic foot ([place]) 05/22/2018  . Tremor 10/17/2017  . Acute encephalopathy   . Acute ischemic stroke ([place])   . Generalized weakness   . Cerebrovascular disease   . Altered mental status   . Acute respiratory failure with hypercapnia ([place])   . CVA (cerebral vascular accident) ([place]) 01/01/2017  . Dehydration 01/01/2017  . Anemia 01/01/2017  . Acute respiratory failure ([place]) 12/15/2016  . Morbid obesity ([place]) 12/15/2016  . Hard of hearing 12/15/2016  . Chronic kidney disease, stage III (moderate) ([place]) 12/06/2016  . Bell's palsy   . Facial droop   . Paroxysmal atrial fibrillation (HCC)   . Embolic stroke ([place])   . Dysphagia 03/14/2015  . Dysarthria 03/14/2015  . Stroke ([place]) 03/14/2015  . Cellulitis 01/11/2014  . Bilateral lower extremity edema 01/11/2014  . Venous stasis dermatitis 01/11/2014  . DM type 2 with diabetic peripheral neuropathy ([place]) 01/11/2014  . Cellulitis and abscess of leg 01/11/2014  . Cellulitis and abscess 01/11/2014  . Depression 06/15/2013  . GERD (gastroesophageal reflux disease) 06/15/2013  . Constipation 06/15/2013   . Diabetic neuropathy ([place]) 06/15/2013  . Type 1 diabetes mellitus with peripheral circulatory complications ([place]) 11/57/2620  . Essential hypertension, benign 04/07/2013  . AKI (acute kidney injury) ([place]) 04/04/2013  . Bilateral lower leg cellulitis 04/04/2013  . Right hip pain 04/04/2013  . Renal insufficiency 12/16/2011  . History of CVA (cerebrovascular accident) 12/16/2011  . Dyslipidemia 12/16/2011  . Obesity (BMI 30-39.9) 12/16/2011  . Umbilical hernia with obstruction-partial on CT scan; easily reducible 12/14/2011  . History of PSVT (paroxysmal supraventricular tachycardia) 12/14/2011  . DM (diabetes mellitus) ([place]) 12/14/2011    Orientation RESPIRATION BLADDER Height & Weight     Self, Time, Situation, Place  O2(nasal cannula 5L) Continent Weight: 270 lb 15.1 oz (122.9 kg) Height:  5\' 10"  (177.8 cm)  BEHAVIORAL SYMPTOMS/MOOD NEUROLOGICAL BOWEL NUTRITION STATUS      Continent Diet(please see DC summary)  AMBULATORY STATUS COMMUNICATION OF NEEDS Skin   Extensive Assist(baseline) Verbally Skin abrasions(old pressure wound, buttocks; scabbed over)                       Personal Care Assistance Level of Assistance  Bathing, Feeding, Dressing Bathing Assistance: Limited assistance Feeding assistance: Independent Dressing Assistance: Limited assistance     Functional Limitations Info  Sight, Hearing, Speech Sight Info: Adequate Hearing Info: Adequate Speech Info: Impaired(dysarthria)    SPECIAL CARE FACTORS FREQUENCY  PT (By licensed PT), OT (By licensed OT)     PT Frequency: 3x/week OT Frequency: 3x/week            Contractures Contractures Info: Not present  Additional Factors Info  Code Status, Allergies, Insulin Sliding Scale Code Status Info: DNR Allergies Info: Ativan Lorazepam, Flomax Tamsulosin Hcl, Glucophage Metformin Hcl   Insulin Sliding Scale Info: novolog 3x/day with meals, lantus 2x/day       Current Medications (05/24/2018):  This  is the current hospital active medication list Current Facility-Administered Medications  Medication Dose Route Frequency Provider Last Rate Last Dose  . acetaminophen (TYLENOL) tablet 650 mg  650 mg Oral Q4H PRN Janora Norlander, MD   650 mg at 05/23/18 2110   Or  . acetaminophen (TYLENOL) solution 650 mg  650 mg Per Tube Q4H PRN Janora Norlander, MD       Or  . acetaminophen (TYLENOL) suppository 650 mg  650 mg Rectal Q4H PRN Janora Norlander, MD      . amitriptyline (ELAVIL) tablet 25 mg  25 mg Oral QHS Janora Norlander, MD   25 mg at 05/23/18 2102  . aspirin EC tablet 81 mg  81 mg Oral Daily Janora Norlander, MD   81 mg at 05/23/18 3354  . B-complex with vitamin C tablet 1 tablet  1 tablet Oral Daily Janora Norlander, MD   1 tablet at 05/23/18 [phone]  . ceFAZolin (ANCEF) IVPB 1 g/50 mL premix  1 g Intravenous Q12H Norva Riffle, [facility]   Stopped at 05/23/18 2134  . clopidogrel (PLAVIX) tablet 75 mg  75 mg Oral Daily Janora Norlander, MD   75 mg at 05/23/18 0831  . enoxaparin (LOVENOX) injection 30 mg  30 mg Subcutaneous Q24H Janora Norlander, MD   30 mg at 05/23/18 1742  . fluticasone (FLONASE) 50 MCG/ACT nasal spray 1 spray  1 spray Each Nare Daily Janora Norlander, MD   1 spray at 05/23/18 [phone]  . furosemide (LASIX) tablet 80 mg  80 mg Oral BID Janora Norlander, MD   80 mg at 05/24/18 0841  . gabapentin (NEURONTIN) capsule 200 mg  200 mg Oral BID Janora Norlander, MD   200 mg at 05/23/18 2102  . insulin aspart (novoLOG) injection 0-20 Units  0-20 Units Subcutaneous TID WC Janora Norlander, MD   3 Units at 05/24/18 [phone]  . insulin glargine (LANTUS) injection 70 Units  70 Units Subcutaneous BID Janora Norlander, MD   70 Units at 05/23/18 2103  . metoprolol tartrate (LOPRESSOR) tablet 25 mg  25 mg Oral BID Janora Norlander, MD   25 mg at 05/23/18 2102  . pantoprazole (PROTONIX) EC tablet 40 mg  40 mg Oral Daily Janora Norlander, MD   40 mg at 05/23/18 0831  . polyvinyl alcohol (LIQUIFILM  TEARS) 1.4 % ophthalmic solution 1 drop  1 drop Both Eyes TID PRN Janora Norlander, MD      . potassium chloride (KLOR-CON) packet 20 mEq  20 mEq Oral Daily Janora Norlander, MD   20 mEq at 05/23/18 0830  . senna-docusate (Senokot-S) tablet 1 tablet  1 tablet Oral BID Janora Norlander, MD   1 tablet at 05/23/18 2102  . simvastatin (ZOCOR) tablet 20 mg  20 mg Oral Daily Janora Norlander, MD   20 mg at 05/23/18 0830  . terazosin (HYTRIN) capsule 4 mg  4 mg Oral Daily Janora Norlander, MD   4 mg at 05/23/18 0834  . vitamin B-12 (CYANOCOBALAMIN) tablet 1,000 mcg  1,000 mcg Oral Daily Janora Norlander, MD   1,000 mcg at 05/23/18 [phone]  Discharge Medications: Please see discharge summary for a list of discharge medications.  Relevant Imaging Results:  Relevant Lab Results:   Additional Information SSN: [id number]  Estanislado Emms, LCSW

## 2018-05-24 NOTE — Progress Notes (Signed)
Pt back on unit from MRI. Delia Heady RN

## 2018-05-24 NOTE — Progress Notes (Signed)
Pt transported off unit to MRI. P. Amo Erich Kochan RN 

## 2018-05-24 NOTE — Progress Notes (Addendum)
PROGRESS NOTE        PATIENT DETAILS Name: Jared Hall Age: 84 y.o. Sex: male Date of Birth: 11/20/1931 Admit Date: 05/22/2018 Admitting Physician Janora Norlander, MD FIE:PPIRJ, [name], MD  Brief Narrative: Patient is a 84 y.o. male with prior history of CVA, diastolic heart failure, chronic venous stasis dermatitis, dyslipidemia, DM-2-presented with right arm numbness.  He was also found to have possible bilateral lower extremity cellulitis.  He was admitted for further evaluation and treatment.  Subjective: Sleeping-very minimal right upper extremity numbness persists.  Erythema in the left lower extremity has essentially resolved, continues to have intense erythema in the right lower extremity.  Assessment/Plan: Right arm numbness: Essentially resolved, MRI of the brain did not show any acute CVA.  Doubt any further work-up is required as low suspicion for CVA.  Continue dual antiplatelets and statin.  By rehab services, felt to require SNF.  Following-likely SNF on 7/6, as he needs 3 midnight stay.    Bilateral lower extremity cellulitis: Suspect this is mild cellulitis or acute on chronic venous stasis dermatitis.  Markedly better with Ancef, continue to elevate both legs, add compression stockings.  Patient refused a lower extremity Doppler.  If clinical improvement continues, will switch to Keflex in the next day or so.  Chronic kidney disease stage III: Post usual baseline, follow.   History of CVA: Appears to have some dysarthria-remains on dual antiplatelets and statin.  MRI of the brain this admission negative for acute CVA.  Reviewed outpatient MD note (01/22/2017-Dr. Pandey)-has history of A. fib but no longer on anticoagulation due to fall risk  PAF: Telemetry shows sinus rhythm, continue metoprolol-see above regarding anticoagulation.    Chronic diastolic heart failure: Appears to have some amount of mild lower extremity edema-is probably secondary to  chronic venous stasis changes-he otherwise remains compensated.  Continue with diuretics.    Insulin dependent DM-2: CBG stable, continue with Lantus 70 units twice daily and SSI.  Dyslipidemia: Continue statin  Hypertension: Stable-continue with Hytrin, metoprolol  Chronic hypoxic respiratory failure: Continue home O2  Peripheral neuropathy: Suspect related to DM-2, continue Elavil and Neurontin.  DVT Prophylaxis: Prophylactic Lovenox   Code Status: DNR  Family Communication: None at bedside  Disposition Plan: Remain inpatient-SNF on 7/6  Antimicrobial agents: Anti-infectives (From admission, onward)   Start     Dose/Rate Route Frequency Ordered Stop   05/23/18 2100  levofloxacin (LEVAQUIN) tablet 500 mg  Status:  Discontinued    Note to Pharmacy:  For 7 days     500 mg Oral Daily 05/23/18 2054 05/23/18 2057   05/23/18 1200  ceFAZolin (ANCEF) IVPB 1 g/50 mL premix     1 g 100 mL/hr over 30 Minutes Intravenous Every 12 hours 05/23/18 1054     05/22/18 1800  clindamycin (CLEOCIN) capsule 300 mg  Status:  Discontinued     300 mg Oral Every 6 hours 05/22/18 1654 05/23/18 1028      Procedures: None  CONSULTS:  neurology  Time spent: 25- minutes-Greater than 50% of this time was spent in counseling, explanation of diagnosis, planning of further management, and coordination of care.  MEDICATIONS: Scheduled Meds: . amitriptyline  25 mg Oral QHS  . aspirin EC  81 mg Oral Daily  . B-complex with vitamin C  1 tablet Oral Daily  . clopidogrel  75 mg Oral  Daily  . enoxaparin (LOVENOX) injection  30 mg Subcutaneous Q24H  . fluticasone  1 spray Each Nare Daily  . furosemide  80 mg Oral BID  . gabapentin  200 mg Oral BID  . insulin aspart  0-20 Units Subcutaneous TID WC  . insulin glargine  70 Units Subcutaneous BID  . metoprolol tartrate  25 mg Oral BID  . pantoprazole  40 mg Oral Daily  . potassium chloride  20 mEq Oral Daily  . senna-docusate  1 tablet Oral BID  .  simvastatin  20 mg Oral Daily  . terazosin  4 mg Oral Daily  . vitamin B-12  1,000 mcg Oral Daily   Continuous Infusions: .  ceFAZolin (ANCEF) IV 1 g (05/24/18 1125)   PRN Meds:.acetaminophen **OR** acetaminophen (TYLENOL) oral liquid 160 mg/5 mL **OR** acetaminophen, polyvinyl alcohol   PHYSICAL EXAM: Vital signs: Vitals:   05/24/18 0032 05/24/18 0624 05/24/18 0737 05/24/18 1104  BP: 107/72 (!) 111/52 (!) 116/50 (!) 151/53  Pulse: 67 69 (!) 56 69  Resp: 16 18 20 18   Temp: 99.6 F (37.6 C) 98.6 F (37 C) 97.8 F (36.6 C) 97.7 F (36.5 C)  TempSrc: Axillary Axillary Oral Oral  SpO2: 100% 100% 95% 100%  Weight:      Height:       Filed Weights   05/23/18 2135  Weight: 122.9 kg (270 lb 15.1 oz)   Body mass index is 38.88 kg/m.   General appearance:Awake, alert, not in any distress.  Eyes:no scleral icterus. HEENT: Atraumatic and Normocephalic Neck: supple, no JVD. Resp:Good air entry bilaterally,no rales or rhonchi CVS: S1 S2 regular, no murmurs.  GI: Bowel sounds present, Non tender and not distended with no gaurding, rigidity or rebound. Extremities: B/L Lower Ext shows ++ edema, both legs are warm to touch.  Erythema has markedly improved in the right lower extremity, continues to have erythema in the left lower extremity.  No crepitus-leg is not tense, nor is it tender. Neurology:  Non focal Psychiatric: Normal judgment and insight. Normal mood. Musculoskeletal:No digital cyanosis Skin:No Rash, warm and dry Wounds:N/A  I have personally reviewed following labs and imaging studies  LABORATORY DATA: CBC: Recent Labs  Lab 05/22/18 1300 05/22/18 1310 05/23/18 0023  WBC 7.8  --  6.5  NEUTROABS 4.9  --   --   HGB 10.4* 11.9* 10.0*  HCT 35.9* 35.0* 34.5*  MCV 71.7*  --  71.6*  PLT 200  --  102    Basic Metabolic Panel: Recent Labs  Lab 05/22/18 1300 05/22/18 1310 05/23/18 0023 05/24/18 0417  NA 141 139 140 139  K 3.7 3.7 3.7 3.8  CL 98 93* 97* 96*    CO2 35*  --  34* 35*  GLUCOSE 72 70 131* 166*  BUN 13 16 13 14   CREATININE 1.79* 1.80* 1.82* 1.74*  CALCIUM 9.4  --  9.0 9.2    GFR: Estimated Creatinine Clearance: 40.8 mL/min (A) (by C-G formula based on SCr of 1.74 mg/dL (H)).  Liver Function Tests: Recent Labs  Lab 05/22/18 1300  AST 31  ALT 22  ALKPHOS 63  BILITOT 0.9  PROT 6.4*  ALBUMIN 3.1*   No results for input(s): LIPASE, AMYLASE in the last 168 hours. No results for input(s): AMMONIA in the last 168 hours.  Coagulation Profile: Recent Labs  Lab 05/22/18 1300  INR 1.06    Cardiac Enzymes: Recent Labs  Lab 05/22/18 1835 05/23/18 0023  TROPONINI <0.03 <0.03  BNP (last 3 results) No results for input(s): PROBNP in the last 8760 hours.  HbA1C: Recent Labs    05/22/18 1837  HGBA1C 10.2*    CBG: Recent Labs  Lab 05/23/18 1122 05/23/18 1650 05/23/18 2133 05/24/18 0648 05/24/18 1116  GLUCAP 135* 134* 162* 127* 179*    Lipid Profile: Recent Labs    05/23/18 0023  CHOL 123  HDL 26*  LDLCALC 63  TRIG 169*  CHOLHDL 4.7    Thyroid Function Tests: No results for input(s): TSH, T4TOTAL, FREET4, T3FREE, THYROIDAB in the last 72 hours.  Anemia Panel: No results for input(s): VITAMINB12, FOLATE, FERRITIN, TIBC, IRON, RETICCTPCT in the last 72 hours.  Urine analysis:    Component Value Date/Time   COLORURINE YELLOW 05/22/2018 1945   APPEARANCEUR HAZY (A) 05/22/2018 1945   LABSPEC 1.009 05/22/2018 1945   PHURINE 6.0 05/22/2018 1945   GLUCOSEU NEGATIVE 05/22/2018 1945   HGBUR MODERATE (A) 05/22/2018 1945   BILIRUBINUR NEGATIVE 05/22/2018 1945   KETONESUR NEGATIVE 05/22/2018 1945   PROTEINUR NEGATIVE 05/22/2018 1945   UROBILINOGEN 1.0 12/06/2014 1414   NITRITE NEGATIVE 05/22/2018 1945   LEUKOCYTESUR SMALL (A) 05/22/2018 1945    Sepsis Labs: Lactic Acid, Venous    Component Value Date/Time   LATICACIDVEN 0.92 10/17/2017 1439    MICROBIOLOGY: Recent Results (from the past 240  hour(s))  Culture, blood (routine x 2)     Status: None (Preliminary result)   Collection Time: 05/22/18  6:25 PM  Result Value Ref Range Status   Specimen Description BLOOD LEFT ANTECUBITAL  Final   Special Requests   Final    BOTTLES DRAWN AEROBIC ONLY Blood Culture adequate volume   Culture   Final    NO GROWTH < 24 HOURS Performed at [facility], [place] [address] [place], [place] [address]    Report Status PENDING  Incomplete  Culture, blood (routine x 2)     Status: None (Preliminary result)   Collection Time: 05/22/18  6:30 PM  Result Value Ref Range Status   Specimen Description BLOOD LEFT ANTECUBITAL  Final   Special Requests   Final    BOTTLES DRAWN AEROBIC ONLY Blood Culture adequate volume   Culture   Final    NO GROWTH < 24 HOURS Performed at [facility], [place] [address], [place], [place] [address]    Report Status PENDING  Incomplete  MRSA PCR Screening     Status: Abnormal   Collection Time: 05/23/18 11:04 AM  Result Value Ref Range Status   MRSA by PCR POSITIVE (A) NEGATIVE Final    Comment:        The GeneXpert MRSA Assay (FDA approved for NASAL specimens only), is one component of a comprehensive MRSA colonization surveillance program. It is not intended to diagnose MRSA infection nor to guide or monitor treatment for MRSA infections. RESULT CALLED TO, READ BACK BY AND VERIFIED WITH: Alben Spittle RN 14:40 05/23/18 ([email or web address]) Performed at [facility], [place] [address] [place],  [address]     RADIOLOGY STUDIES/RESULTS: Dg Chest 2 View  Result Date: 05/22/2018 CLINICAL DATA:  Lower extremity edema, numbness in both hands history prostate cancer, type II diabetes mellitus, hypertension, CHF EXAM: CHEST - 2 VIEW COMPARISON:  10/17/2017 FINDINGS: Enlargement of cardiac silhouette. Atherosclerotic calcification aorta. Mediastinal contours and pulmonary vascularity normal. Subsegmental atelectasis at RIGHT base. Atelectasis and question  coexistent consolidation at LEFT base. Upper lungs clear. Small LEFT pleural effusion. No pneumothorax or acute  osseous findings. IMPRESSION: Enlargement of cardiac silhouette. RIGHT basilar atelectasis with atelectasis and suspect consolidation at LEFT base with small LEFT pleural effusion. Electronically Signed   By: Lavonia Dana M.D.   On: 05/22/2018 16:47   Mr Brain Wo Contrast  Result Date: 05/24/2018 CLINICAL DATA:  Right arm numbness. EXAM: MRI HEAD WITHOUT CONTRAST TECHNIQUE: Multiplanar, multiecho pulse sequences of the brain and surrounding structures were obtained without intravenous contrast. COMPARISON:  02/13/2017 brain MRI.  Head CT 05/22/2018 FINDINGS: Brain: No acute infarction, hemorrhage, hydrocephalus, extra-axial collection or mass lesion. Confluent gliosis in the deep cerebral white matter attributed to chronic small vessel ischemia. Patchy superimposed chronic lacunar infarcts in the centrum semiovale with superimposed dilated perivascular spaces. Small remote right more than left cerebellar infarcts. Small remote right parietal cortex infarct. Remote hemorrhage at 2 of the chronic lacunes. Incidental retro cerebellar CSF accumulation. Vascular: Major flow voids are preserved Skull and upper cervical spine: No evidence of marrow lesion Sinuses/Orbits: Bilateral cataract resection. IMPRESSION: 1. No acute finding. 2. Extensive chronic small vessel ischemia. Electronically Signed   By: Monte Fantasia M.D.   On: 05/24/2018 10:37   Ct Head Code Stroke Wo Contrast`  Result Date: 05/22/2018 CLINICAL DATA:  Code stroke. Numbness or tingling. Right-sided weakness EXAM: CT HEAD WITHOUT CONTRAST TECHNIQUE: Contiguous axial images were obtained from the base of the skull through the vertex without intravenous contrast. COMPARISON:  10/17/2017 FINDINGS: Brain: No evidence of acute infarction, hemorrhage, hydrocephalus, extra-axial collection or mass lesion/mass effect. Small remote infarcts in the  right cerebellum, right frontal parietal cortex, and high anterior right frontal subcortical white matter. Chronic small vessel ischemia with extensive low-density in the cerebral white matter. Cerebral volume loss. Incidental retro cerebellar CSF accumulation, right eccentric. Vascular: Atherosclerotic calcification.  No hyperdense vessel. Skull: Negative Sinuses/Orbits: Negative Other: These results were communicated to Dr. Cheral Marker at [place] 7/3/2019by text page via the [facility] messaging system. ASPECTS [facility] Stroke Program Early CT Score) - Ganglionic level infarction (caudate, lentiform nuclei, internal capsule, insula, M1-M3 cortex): 7 - Supraganglionic infarction (M4-M6 cortex): 3 Total score (0-10 with 10 being normal): 10 IMPRESSION: 1. No acute finding 2. Extensive chronic ischemic injury. Electronically Signed   By: Monte Fantasia M.D.   On: 05/22/2018 14:00     LOS: 2 days   Oren Binet, MD  [facility]  If 7PM-7AM, please contact night-coverage  Please page via [email or web address]-Password [email or web address]-click on MD name and type text message  05/24/2018, 11:32 AM

## 2018-05-24 NOTE — Progress Notes (Signed)
SLP Cancellation Note  Patient Details Name: Jared Hall MRN: [id number] DOB: Nov 12, 1931   Cancelled treatment:       Reason Eval/Treat Not Completed: SLP screened, no needs identified, will sign off   Kao Berkheimer 05/24/2018, 12:03 PM

## 2018-05-24 NOTE — Progress Notes (Signed)
Pt discharge education and instructions completed. Pt discharge to [facility] and report called off to nurse Lexine Baton at the facility. Pt IV and telemetry removed. Pt awaiting of PTAR to pick up to transport off to disposition. Will continue to closely monitor till pt pick up. P. Angelica Pou RN

## 2018-05-24 NOTE — Care Management Note (Signed)
Case Management Note  Patient Details  Name: Jared Hall MRN: [id number] Date of Birth: 1931/03/13  Subjective/Objective:                    Action/Plan: Pt is discharging back to his ALF: [facility] today. Pt has orders for [facility] services. CM spoke to [facility] and they provide their own in house therapy. CM faxed them the orders for the [facility] as per request: [phone] CSW is arranging the d/c and transportation to [facility]. CM signing off.   Expected Discharge Date:  05/24/18               Expected Discharge Plan:  Assisted Living / Rest Home  In-House Referral:  Clinical Social Work  Discharge planning Services  CM Consult  Post Acute Care Choice:  Home Health Choice offered to:     DME Arranged:    DME Agency:     HH Arranged:  PT, OT HH Agency:  Chief Executive Officer)  Status of Service:  Completed, signed off  If discussed at [place] of Stay Meetings, dates discussed:    Additional Comments:  Pollie Friar, RN 05/24/2018, 1:41 PM

## 2018-05-27 ENCOUNTER — Emergency Department (HOSPITAL_COMMUNITY): Payer: Medicare Other

## 2018-05-27 ENCOUNTER — Encounter (HOSPITAL_COMMUNITY): Payer: Self-pay

## 2018-05-27 ENCOUNTER — Emergency Department (HOSPITAL_COMMUNITY)
Admission: EM | Admit: 2018-05-27 | Discharge: 2018-05-27 | Disposition: A | Discharge status: Home / Self Care | Payer: Medicare Other | Attending: Emergency Medicine | Admitting: Emergency Medicine

## 2018-05-27 ENCOUNTER — Other Ambulatory Visit: Payer: Self-pay

## 2018-05-27 DIAGNOSIS — E1122 Type 2 diabetes mellitus with diabetic chronic kidney disease: Secondary | ICD-10-CM | POA: Diagnosis not present

## 2018-05-27 DIAGNOSIS — R0602 Shortness of breath: Secondary | ICD-10-CM | POA: Insufficient documentation

## 2018-05-27 DIAGNOSIS — Z8673 Personal history of transient ischemic attack (TIA), and cerebral infarction without residual deficits: Secondary | ICD-10-CM | POA: Insufficient documentation

## 2018-05-27 DIAGNOSIS — I130 Hypertensive heart and chronic kidney disease with heart failure and stage 1 through stage 4 chronic kidney disease, or unspecified chronic kidney disease: Secondary | ICD-10-CM | POA: Insufficient documentation

## 2018-05-27 DIAGNOSIS — Z8546 Personal history of malignant neoplasm of prostate: Secondary | ICD-10-CM | POA: Insufficient documentation

## 2018-05-27 DIAGNOSIS — Z7982 Long term (current) use of aspirin: Secondary | ICD-10-CM | POA: Diagnosis not present

## 2018-05-27 DIAGNOSIS — N183 Chronic kidney disease, stage 3 (moderate): Secondary | ICD-10-CM | POA: Insufficient documentation

## 2018-05-27 DIAGNOSIS — L03116 Cellulitis of left lower limb: Secondary | ICD-10-CM | POA: Diagnosis not present

## 2018-05-27 DIAGNOSIS — Z7902 Long term (current) use of antithrombotics/antiplatelets: Secondary | ICD-10-CM | POA: Insufficient documentation

## 2018-05-27 DIAGNOSIS — Z794 Long term (current) use of insulin: Secondary | ICD-10-CM | POA: Diagnosis not present

## 2018-05-27 DIAGNOSIS — I509 Heart failure, unspecified: Secondary | ICD-10-CM | POA: Diagnosis not present

## 2018-05-27 DIAGNOSIS — L03119 Cellulitis of unspecified part of limb: Secondary | ICD-10-CM

## 2018-05-27 DIAGNOSIS — M2550 Pain in unspecified joint: Secondary | ICD-10-CM | POA: Diagnosis not present

## 2018-05-27 DIAGNOSIS — R079 Chest pain, unspecified: Secondary | ICD-10-CM | POA: Diagnosis not present

## 2018-05-27 DIAGNOSIS — I491 Atrial premature depolarization: Secondary | ICD-10-CM | POA: Diagnosis not present

## 2018-05-27 DIAGNOSIS — L03115 Cellulitis of right lower limb: Secondary | ICD-10-CM | POA: Diagnosis not present

## 2018-05-27 DIAGNOSIS — R0789 Other chest pain: Secondary | ICD-10-CM | POA: Diagnosis not present

## 2018-05-27 DIAGNOSIS — I440 Atrioventricular block, first degree: Secondary | ICD-10-CM | POA: Diagnosis not present

## 2018-05-27 DIAGNOSIS — Z79899 Other long term (current) drug therapy: Secondary | ICD-10-CM | POA: Diagnosis not present

## 2018-05-27 DIAGNOSIS — Z7401 Bed confinement status: Secondary | ICD-10-CM | POA: Diagnosis not present

## 2018-05-27 LAB — BASIC METABOLIC PANEL
ANION GAP: 11 (ref 5–15)
BUN: 13 mg/dL (ref 8–23)
CHLORIDE: 95 mmol/L — AB (ref 98–111)
CO2: 32 mmol/L (ref 22–32)
CREATININE: 1.57 mg/dL — AB (ref 0.61–1.24)
Calcium: 9.2 mg/dL (ref 8.9–10.3)
GFR calc non Af Amer: 39 mL/min — ABNORMAL LOW (ref >60)
GFR, EST AFRICAN AMERICAN: 45 mL/min — AB (ref >60)
Glucose, Bld: 118 mg/dL — ABNORMAL HIGH (ref 70–99)
Potassium: 3.6 mmol/L (ref 3.5–5.1)
SODIUM: 138 mmol/L (ref 135–145)

## 2018-05-27 LAB — BRAIN NATRIURETIC PEPTIDE: B Natriuretic Peptide: 36.6 pg/mL (ref 0.0–100.0)

## 2018-05-27 LAB — I-STAT TROPONIN, ED
TROPONIN I, POC: 0.01 ng/mL (ref 0.00–0.08)
TROPONIN I, POC: 0.01 ng/mL (ref 0.00–0.08)

## 2018-05-27 LAB — CBC
HEMATOCRIT: 40.5 % (ref 39.0–52.0)
HEMOGLOBIN: 11.5 g/dL — AB (ref 13.0–17.0)
MCH: 20.4 pg — AB (ref 26.0–34.0)
MCHC: 28.4 g/dL — ABNORMAL LOW (ref 30.0–36.0)
MCV: 71.9 fL — ABNORMAL LOW (ref 78.0–100.0)
Platelets: 226 10*3/uL (ref 150–400)
RBC: 5.63 MIL/uL (ref 4.22–5.81)
RDW: 15.3 % (ref 11.5–15.5)
WBC: 7.1 10*3/uL (ref 4.0–10.5)

## 2018-05-27 LAB — CULTURE, BLOOD (ROUTINE X 2)
Culture: NO GROWTH
Culture: NO GROWTH
Special Requests: ADEQUATE
Special Requests: ADEQUATE

## 2018-05-27 MED ORDER — FUROSEMIDE 10 MG/ML IJ SOLN
80.0000 mg | Freq: Once | INTRAMUSCULAR | Status: AC
  Administered 2018-05-27: 80 mg via INTRAVENOUS
  Filled 2018-05-27: qty 8

## 2018-05-27 MED ORDER — IOPAMIDOL (ISOVUE-370) INJECTION 76%
INTRAVENOUS | Status: AC
  Filled 2018-05-27: qty 100

## 2018-05-27 MED ORDER — IOPAMIDOL (ISOVUE-370) INJECTION 76%
100.0000 mL | Freq: Once | INTRAVENOUS | Status: AC | PRN
  Administered 2018-05-27: 100 mL via INTRAVENOUS

## 2018-05-27 NOTE — ED Notes (Signed)
Pt was changed and repositioned

## 2018-05-27 NOTE — ED Notes (Signed)
Called lab to add on BNP. ?

## 2018-05-27 NOTE — ED Notes (Signed)
ptar CALLED FOR TRANSPPORT

## 2018-05-27 NOTE — ED Provider Notes (Addendum)
[facility] EMERGENCY DEPARTMENT Provider Note   CSN: [id number] Arrival date & time: 05/27/18  1413     History   Chief Complaint Chief Complaint  Patient presents with  . Chest Pain    HPI Jared Hall is a 84 y.o. male.  HPI  This is an 84 year old male with a history of CHF, hypertension, prostate cancer, stroke, diabetes who presents with shortness of breath.  Patient reports that he was alone in his room at his living facility when he began to have shortness of breath.  At that time his chest was tight.  He states that this lasted for approximately 1 hour.  He has difficulty describing the pain.  He denies any radiation.  Currently he is pain-free.  Denies any nausea, vomiting, cough, fevers.  Does report some lower extremity swelling.  Also notes increased redness of the lower extremities.  Past Medical History:  Diagnosis Date  . Alpha thalassemia ([place])   . Anemia 01/01/2017  . BPH (benign prostatic hyperplasia)   . CHF (congestive heart failure) ([place])   . High cholesterol   . Hypertension   . Lower extremity edema   . Onychomycosis   . Prostate cancer ([place])    S/P "8 weeks of radiation"  . Prostatitis    recurrent  . Sleep apnea   . Stroke [facility]) ~ 2005   denies residual on 2/123/2015  . Type II diabetes mellitus ([place])   . Umbilical hernia   . Urinary urgency    with incontinence    Patient Active Problem List   Diagnosis Date Noted  . TIA (transient ischemic attack) 05/22/2018  . Cellulitis in diabetic foot ([place]) 05/22/2018  . Tremor 10/17/2017  . Acute encephalopathy   . Acute ischemic stroke ([place])   . Generalized weakness   . Cerebrovascular disease   . Altered mental status   . Acute respiratory failure with hypercapnia ([place])   . CVA (cerebral vascular accident) ([place]) 01/01/2017  . Dehydration 01/01/2017  . Anemia 01/01/2017  . Acute respiratory failure ([place]) 12/15/2016  . Morbid obesity ([place]) 12/15/2016  . Hard of hearing  12/15/2016  . Chronic kidney disease, stage III (moderate) ([place]) 12/06/2016  . Bell's palsy   . Facial droop   . Paroxysmal atrial fibrillation (HCC)   . Embolic stroke ([place])   . Dysphagia 03/14/2015  . Dysarthria 03/14/2015  . Stroke ([place]) 03/14/2015  . Cellulitis 01/11/2014  . Bilateral lower extremity edema 01/11/2014  . Venous stasis dermatitis 01/11/2014  . DM type 2 with diabetic peripheral neuropathy ([place]) 01/11/2014  . Cellulitis and abscess of leg 01/11/2014  . Cellulitis and abscess 01/11/2014  . Depression 06/15/2013  . GERD (gastroesophageal reflux disease) 06/15/2013  . Constipation 06/15/2013  . Diabetic neuropathy ([place]) 06/15/2013  . Type 1 diabetes mellitus with peripheral circulatory complications ([place]) 63/14/9702  . Essential hypertension, benign 04/07/2013  . AKI (acute kidney injury) ([place]) 04/04/2013  . Bilateral lower leg cellulitis 04/04/2013  . Right hip pain 04/04/2013  . Renal insufficiency 12/16/2011  . History of CVA (cerebrovascular accident) 12/16/2011  . Dyslipidemia 12/16/2011  . Obesity (BMI 30-39.9) 12/16/2011  . Umbilical hernia with obstruction-partial on CT scan; easily reducible 12/14/2011  . History of PSVT (paroxysmal supraventricular tachycardia) 12/14/2011  . DM (diabetes mellitus) ([place]) 12/14/2011    Past Surgical History:  Procedure Laterality Date  . ADENOIDECTOMY    . HERNIA REPAIR    . MASTOIDECTOMY    . TONSILLECTOMY    .  VASECTOMY    . VENTRAL HERNIA REPAIR  12/18/2011   Procedure: HERNIA REPAIR VENTRAL ADULT;  Surgeon: Adin Hector, MD;  Location: [facility];  Service: General;  Laterality: N/A;        Home Medications    Prior to Admission medications   Medication Sig Start Date End Date Taking? Authorizing Provider  amitriptyline (ELAVIL) 25 MG tablet Take 1 tablet (25 mg total) by mouth at bedtime. 10/20/17  Yes Rosita Fire, MD  aspirin EC 81 MG tablet Take 81 mg by mouth daily.   Yes [provider]  azelastine (OPTIVAR) 0.05 % ophthalmic solution Place 1 drop into both eyes 2 (two) times daily as needed.   Yes [provider]  B Complex Vitamins (VITAMIN B COMPLEX PO) Take 1 tablet by mouth daily.   Yes [provider]  clopidogrel (PLAVIX) 75 MG tablet Take 75 mg by mouth daily.    Yes [provider]  doxycycline (VIBRA-TABS) 100 MG tablet Take 1 tablet (100 mg total) by mouth 2 (two) times daily. 05/24/18  Yes Ghimire, Henreitta Leber, MD  fluticasone (FLONASE) 50 MCG/ACT nasal spray Place 1 spray into both nostrils daily. 03/17/15  Yes Kelvin Cellar, MD  furosemide (LASIX) 80 MG tablet Take 80 mg by mouth 2 (two) times daily.    Yes [provider]  gabapentin (NEURONTIN) 100 MG capsule Take 2 capsules (200 mg total) by mouth 2 (two) times daily. Hold for sedation. 05/24/18  Yes Ghimire, Henreitta Leber, MD  insulin aspart (NOVOLOG) 100 UNIT/ML injection Inject 7-18 Units into the skin 2 (two) times daily. Blood sugar < 150 = 0 units 151-200 = 7 units 201-250 = 9 units 251-300 = 11 units 301-350 = 13 units 351-400 = 15 units >400 = 18 units   Yes [provider]  insulin glargine (LANTUS) 100 UNIT/ML injection Inject 0.5 mLs (50 Units total) into the skin 2 (two) times daily. 10/20/17  Yes Rosita Fire, MD  metoprolol tartrate (LOPRESSOR) 25 MG tablet Take 1 tablet (25 mg total) by mouth 2 (two) times daily. 03/17/15  Yes Kelvin Cellar, MD  neomycin-bacitracin-polymyxin (NEOSPORIN) ointment Apply 1 application topically daily. Apply to right ear to protect from oxygen tub   Yes [provider]  pantoprazole (PROTONIX) 40 MG tablet Take 40 mg by mouth daily.    Yes [provider]  potassium chloride SA (K-DUR,KLOR-CON) 20 MEQ tablet Take 20 mEq by mouth daily.   Yes [provider]  senna-docusate (SENOKOT-S) 8.6-50 MG tablet Take 1 tablet by mouth 2 (two) times daily.   Yes [provider]  simvastatin  (ZOCOR) 20 MG tablet Take 20 mg by mouth daily.   Yes [provider]  terazosin (HYTRIN) 2 MG capsule Take 4 mg by mouth daily.    Yes [provider]  traMADol (ULTRAM) 50 MG tablet Take 1 tablet (50 mg total) by mouth every 6 (six) hours as needed for moderate pain. 05/24/18  Yes Ghimire, Henreitta Leber, MD  vitamin B-12 (CYANOCOBALAMIN) 1000 MCG tablet Take 1,000 mcg by mouth daily.   Yes [provider]  White Petrolatum-Mineral Oil (GENTEAL TEARS NIGHT-TIME) OINT Place 1 application into the right eye See admin instructions. PULL DOWN THE LOWER RIGHT EYELID AND APPLY A SMALL AMOUNT THREE TIMES DAILY UNTILL COMFORTABLE 05/16/18  Yes [provider]    Family History Family History  Problem Relation Age of Onset  . Pneumonia Mother   . Heart attack  Father     Social History Social History   Tobacco Use  . Smoking status: Never Smoker  . Smokeless tobacco: Never Used  Substance Use Topics  . Alcohol use: No  . Drug use: No     Allergies   Ativan [lorazepam]; Flomax [tamsulosin hcl]; and Glucophage [metformin hcl]   Review of Systems Review of Systems  Constitutional: Negative for fever.  Respiratory: Positive for chest tightness and shortness of breath.   Cardiovascular: Positive for leg swelling. Negative for chest pain.  Gastrointestinal: Negative for abdominal pain, diarrhea, nausea and vomiting.  Genitourinary: Negative for dysuria.  Skin: Positive for color change.  All other systems reviewed and are negative.    Physical Exam Updated Vital Signs BP (!) 139/54   Pulse 87   Temp 98 F (36.7 C) (Oral)   Resp 15   Ht 5\' 10"  (1.778 m)   Wt 122.5 kg (270 lb)   SpO2 100%   BMI 38.74 kg/m   Physical Exam  Constitutional: He is oriented to person, place, and time.  Elderly, nontoxic-appearing  HENT:  Head: Normocephalic and atraumatic.  Eyes: Pupils are equal, round, and reactive to light.  Neck: Neck supple.  Cardiovascular:  Normal rate, regular rhythm, normal heart sounds and normal pulses.  No murmur heard. Pulmonary/Chest: Effort normal and breath sounds normal. No respiratory distress. He has no wheezes.  Cannula in place, no respiratory distress  Abdominal: Soft. Bowel sounds are normal. There is no tenderness. There is no rebound.  Musculoskeletal:       Right lower leg: He exhibits edema.       Left lower leg: He exhibits edema.  2+ edema bilateral lower extremities  Lymphadenopathy:    He has no cervical adenopathy.  Neurological: He is alert and oriented to person, place, and time.  Skin: Skin is warm and dry.  Blanching erythema from the ankles to the mid shins circumferentially and bilaterally  Psychiatric: He has a normal mood and affect.  Nursing note and vitals reviewed.    ED Treatments / Results  Labs (all labs ordered are listed, but only abnormal results are displayed) Labs Reviewed  CBC - Abnormal; Notable for the following components:      Result Value   Hemoglobin 11.5 (*)    MCV 71.9 (*)    MCH 20.4 (*)    MCHC 28.4 (*)    All other components within normal limits  BASIC METABOLIC PANEL - Abnormal; Notable for the following components:   Chloride 95 (*)    Glucose, Bld 118 (*)    Creatinine, Ser 1.57 (*)    GFR calc non Af Amer 39 (*)    GFR calc Af Amer 45 (*)    All other components within normal limits  BRAIN NATRIURETIC PEPTIDE  I-STAT TROPONIN, ED  I-STAT TROPONIN, ED    EKG EKG Interpretation  Date/Time:  Monday May 27 2018 14:19:19 EDT Ventricular Rate:  87 PR Interval:  242 QRS Duration: 94 QT Interval:  384 QTC Calculation: 462 R Axis:   -15 Text Interpretation:  Sinus rhythm with 1st degree A-V block with occasional and consecutive Premature ventricular complexes Inferior infarct , age undetermined Cannot rule out Anterior infarct , age undetermined Abnormal ECG Now with frequent PVCs Confirmed by Thayer Jew [phone]) on 05/27/2018 3:03:46  PM   Radiology Dg Chest 2 View  Result Date: 05/27/2018 CLINICAL DATA:  Sudden onset of chest pain 1 hour prior to presentation while sitting in a  chair. No radiation of pain and no associated symptoms. History of CHF, diabetes. EXAM: CHEST - 2 VIEW COMPARISON:  PA and lateral chest x-ray of May 22, 2018 FINDINGS: The right lung is adequately inflated and clear. On the left inflation is limited but stable. There is stable subsegmental atelectasis in the left mid and lower lung and there is obscuration of the left hemidiaphragm. The cardiac silhouette remains enlarged. The pulmonary vascularity is not engorged. There is calcification in the wall of the thoracic aorta. IMPRESSION: Left basilar atelectasis or pneumonia. Trace left pleural effusion. Cardiomegaly without pulmonary vascular congestion. Thoracic aortic atherosclerosis. Electronically Signed   By: David  Martinique M.D.   On: 05/27/2018 15:02   Ct Angio Chest Pe W And/or Wo Contrast  Result Date: 05/27/2018 CLINICAL DATA:  Shortness of breath, sudden onset EXAM: CT ANGIOGRAPHY CHEST WITH CONTRAST TECHNIQUE: Multidetector CT imaging of the chest was performed using the standard protocol during bolus administration of intravenous contrast. Multiplanar CT image reconstructions and MIPs were obtained to evaluate the vascular anatomy. CONTRAST:  144mL ISOVUE-370 IOPAMIDOL (ISOVUE-370) INJECTION 76% COMPARISON:  12/10/2016 FINDINGS: Cardiovascular: Satisfactory opacification of the pulmonary arteries to the segmental level. No evidence of pulmonary embolism. Cardiomegaly with hypertrophic appearance of the left ventricle. Mild aortic valve calcification. Atherosclerotic calcification of the aorta and coronaries. Limited opacification of the aorta due to contrast timing Mediastinum/Nodes: Negative for adenopathy. Lungs/Pleura: Tracheal and bronchial flattening. Airway thickening and narrowing with opacification affecting bilateral lower lobe segmental bronchi,  worse on the left. Bands of opacity in the left more than right lower lobes and in the left upper lobe, combination of scarring and atelectasis. Upper Abdomen: Partially visualized right renal calcification, likely urinary. Musculoskeletal: Spondylosis and dextrocurvature. Review of the MIP images confirms the above findings. IMPRESSION: 1. No evidence of pulmonary embolism. 2. Tracheobronchomalacia. Chronic narrowing of lower lobe bronchi with scarring and atelectasis worse on the left. 3. Cardiomegaly. 4.  Aortic Atherosclerosis (ICD10-I70.0).  Coronary atherosclerosis. Electronically Signed   By: Monte Fantasia M.D.   On: 05/27/2018 19:37    Procedures Procedures (including critical care time)  Medications Ordered in ED Medications  iopamidol (ISOVUE-370) 76 % injection (has no administration in time range)  furosemide (LASIX) injection 80 mg (80 mg Intravenous Given 05/27/18 1730)  iopamidol (ISOVUE-370) 76 % injection 100 mL (100 mLs Intravenous Contrast Given 05/27/18 1905)     Initial Impression / Assessment and Plan / ED Course  I have reviewed the triage vital signs and the nursing notes.  Pertinent labs & imaging results that were available during my care of the patient were reviewed by me and considered in my medical decision making (see chart for details).     Patient presents with episode of shortness of breath and chest tightness.  Patient's symptoms have resolved.  He is chronically ill-appearing but nontoxic.  Vital signs are reassuring.  He is afebrile satting 100%.  Breath sounds are clear.  He clinically does not appear volume overloaded but does have a history of CHF.  Additionally, denies any infectious symptoms including fever, cough.  Chest x-ray is questioning atelectasis versus pneumonia.  PE is also consideration.  Patient also has a small pleural effusion.  He was given 1 dose of his daily Lasix.  CT scan to evaluate for PE is negative but shows chronic scarring of the  left lower lobe.  This is likely abnormality on chest x-ray.  Patient has remained nontoxic here.  He is on baseline oxygen requirement and  in no respiratory distress.  Repeat troponin is negative.  Doubt ACS.  Will recommend close follow-up with primary physician.    Patient also noted to have cellulitis of the lower extremities.  Currently on doxycycline.  Does not appear to medically ill.  No fevers.  Unclear whether this is worse or better.  Patient is generally a poor historian.  Continue medications at home recommended.  After history, exam, and medical workup I feel the patient has been appropriately medically screened and is safe for discharge home. Pertinent diagnoses were discussed with the patient. Patient was given return precautions.   Final Clinical Impressions(s) / ED Diagnoses   Final diagnoses:  SOB (shortness of breath)  Cellulitis of lower extremity, unspecified laterality    ED Discharge Orders    None       Ahsley Attwood, Barbette Hair, MD 05/27/18 2002    Merryl Hacker, MD 05/27/18 2041

## 2018-05-27 NOTE — ED Triage Notes (Signed)
Pt from nursing facility via ems; 1 hours ago pt was sitting in chair, had sudden onset CP, no radiation, 8/10, no n/v, no dizziness; pt at [facility] approx 4 days ago for TIA; pt hard of hearing  PTA: 324 ASA pain 0/10 BP 150/100 HR 100 98% 3L (lives on 3L) CBG 126 RR 20

## 2018-05-27 NOTE — Discharge Instructions (Addendum)
You were seen today for shortness of breath.  You were given 1 additional dose of Lasix.  Follow-up closely with your primary doctor.  If you develop shortness of breath or chest pain again you need to be reevaluated immediately.

## 2018-05-27 NOTE — ED Notes (Signed)
Patient transported to CT 

## 2018-05-28 DIAGNOSIS — I5032 Chronic diastolic (congestive) heart failure: Secondary | ICD-10-CM | POA: Diagnosis not present

## 2018-05-28 DIAGNOSIS — E1142 Type 2 diabetes mellitus with diabetic polyneuropathy: Secondary | ICD-10-CM | POA: Diagnosis not present

## 2018-05-28 DIAGNOSIS — E1122 Type 2 diabetes mellitus with diabetic chronic kidney disease: Secondary | ICD-10-CM | POA: Diagnosis not present

## 2018-05-28 DIAGNOSIS — L03116 Cellulitis of left lower limb: Secondary | ICD-10-CM | POA: Diagnosis not present

## 2018-05-28 DIAGNOSIS — I130 Hypertensive heart and chronic kidney disease with heart failure and stage 1 through stage 4 chronic kidney disease, or unspecified chronic kidney disease: Secondary | ICD-10-CM | POA: Diagnosis not present

## 2018-05-28 DIAGNOSIS — L03115 Cellulitis of right lower limb: Secondary | ICD-10-CM | POA: Diagnosis not present

## 2018-06-05 DIAGNOSIS — L03116 Cellulitis of left lower limb: Secondary | ICD-10-CM | POA: Diagnosis not present

## 2018-06-05 DIAGNOSIS — L03115 Cellulitis of right lower limb: Secondary | ICD-10-CM | POA: Diagnosis not present

## 2018-06-05 DIAGNOSIS — I130 Hypertensive heart and chronic kidney disease with heart failure and stage 1 through stage 4 chronic kidney disease, or unspecified chronic kidney disease: Secondary | ICD-10-CM | POA: Diagnosis not present

## 2018-06-05 DIAGNOSIS — E1122 Type 2 diabetes mellitus with diabetic chronic kidney disease: Secondary | ICD-10-CM | POA: Diagnosis not present

## 2018-06-05 DIAGNOSIS — I5032 Chronic diastolic (congestive) heart failure: Secondary | ICD-10-CM | POA: Diagnosis not present

## 2018-06-05 DIAGNOSIS — E1142 Type 2 diabetes mellitus with diabetic polyneuropathy: Secondary | ICD-10-CM | POA: Diagnosis not present

## 2018-06-11 DIAGNOSIS — I130 Hypertensive heart and chronic kidney disease with heart failure and stage 1 through stage 4 chronic kidney disease, or unspecified chronic kidney disease: Secondary | ICD-10-CM | POA: Diagnosis not present

## 2018-06-11 DIAGNOSIS — E1142 Type 2 diabetes mellitus with diabetic polyneuropathy: Secondary | ICD-10-CM | POA: Diagnosis not present

## 2018-06-11 DIAGNOSIS — L03115 Cellulitis of right lower limb: Secondary | ICD-10-CM | POA: Diagnosis not present

## 2018-06-11 DIAGNOSIS — I5032 Chronic diastolic (congestive) heart failure: Secondary | ICD-10-CM | POA: Diagnosis not present

## 2018-06-11 DIAGNOSIS — E1122 Type 2 diabetes mellitus with diabetic chronic kidney disease: Secondary | ICD-10-CM | POA: Diagnosis not present

## 2018-06-11 DIAGNOSIS — L03116 Cellulitis of left lower limb: Secondary | ICD-10-CM | POA: Diagnosis not present

## 2018-06-13 DIAGNOSIS — E1122 Type 2 diabetes mellitus with diabetic chronic kidney disease: Secondary | ICD-10-CM | POA: Diagnosis not present

## 2018-06-13 DIAGNOSIS — L03115 Cellulitis of right lower limb: Secondary | ICD-10-CM | POA: Diagnosis not present

## 2018-06-13 DIAGNOSIS — I5032 Chronic diastolic (congestive) heart failure: Secondary | ICD-10-CM | POA: Diagnosis not present

## 2018-06-13 DIAGNOSIS — L03119 Cellulitis of unspecified part of limb: Secondary | ICD-10-CM | POA: Diagnosis not present

## 2018-06-13 DIAGNOSIS — R252 Cramp and spasm: Secondary | ICD-10-CM | POA: Diagnosis not present

## 2018-06-13 DIAGNOSIS — L03116 Cellulitis of left lower limb: Secondary | ICD-10-CM | POA: Diagnosis not present

## 2018-06-13 DIAGNOSIS — I130 Hypertensive heart and chronic kidney disease with heart failure and stage 1 through stage 4 chronic kidney disease, or unspecified chronic kidney disease: Secondary | ICD-10-CM | POA: Diagnosis not present

## 2018-06-13 DIAGNOSIS — E1142 Type 2 diabetes mellitus with diabetic polyneuropathy: Secondary | ICD-10-CM | POA: Diagnosis not present

## 2018-06-13 DIAGNOSIS — R600 Localized edema: Secondary | ICD-10-CM | POA: Diagnosis not present

## 2018-06-28 DIAGNOSIS — L03116 Cellulitis of left lower limb: Secondary | ICD-10-CM | POA: Diagnosis not present

## 2018-06-28 DIAGNOSIS — E1142 Type 2 diabetes mellitus with diabetic polyneuropathy: Secondary | ICD-10-CM | POA: Diagnosis not present

## 2018-06-28 DIAGNOSIS — I130 Hypertensive heart and chronic kidney disease with heart failure and stage 1 through stage 4 chronic kidney disease, or unspecified chronic kidney disease: Secondary | ICD-10-CM | POA: Diagnosis not present

## 2018-06-28 DIAGNOSIS — I5032 Chronic diastolic (congestive) heart failure: Secondary | ICD-10-CM | POA: Diagnosis not present

## 2018-06-28 DIAGNOSIS — L03115 Cellulitis of right lower limb: Secondary | ICD-10-CM | POA: Diagnosis not present

## 2018-06-28 DIAGNOSIS — E1122 Type 2 diabetes mellitus with diabetic chronic kidney disease: Secondary | ICD-10-CM | POA: Diagnosis not present

## 2018-07-09 DIAGNOSIS — R208 Other disturbances of skin sensation: Secondary | ICD-10-CM | POA: Diagnosis not present

## 2018-07-09 DIAGNOSIS — R2242 Localized swelling, mass and lump, left lower limb: Secondary | ICD-10-CM | POA: Diagnosis not present

## 2018-07-14 DIAGNOSIS — L89892 Pressure ulcer of other site, stage 2: Secondary | ICD-10-CM | POA: Diagnosis not present

## 2018-07-14 DIAGNOSIS — E1140 Type 2 diabetes mellitus with diabetic neuropathy, unspecified: Secondary | ICD-10-CM | POA: Diagnosis not present

## 2018-07-14 DIAGNOSIS — D531 Other megaloblastic anemias, not elsewhere classified: Secondary | ICD-10-CM | POA: Diagnosis not present

## 2018-07-14 DIAGNOSIS — F4321 Adjustment disorder with depressed mood: Secondary | ICD-10-CM | POA: Diagnosis not present

## 2018-07-14 DIAGNOSIS — Z6837 Body mass index (BMI) 37.0-37.9, adult: Secondary | ICD-10-CM | POA: Diagnosis not present

## 2018-07-14 DIAGNOSIS — L03115 Cellulitis of right lower limb: Secondary | ICD-10-CM | POA: Diagnosis not present

## 2018-07-14 DIAGNOSIS — E1151 Type 2 diabetes mellitus with diabetic peripheral angiopathy without gangrene: Secondary | ICD-10-CM | POA: Diagnosis not present

## 2018-07-14 DIAGNOSIS — G510 Bell's palsy: Secondary | ICD-10-CM | POA: Diagnosis not present

## 2018-07-14 DIAGNOSIS — I471 Supraventricular tachycardia: Secondary | ICD-10-CM | POA: Diagnosis not present

## 2018-07-14 DIAGNOSIS — Z7901 Long term (current) use of anticoagulants: Secondary | ICD-10-CM | POA: Diagnosis not present

## 2018-07-14 DIAGNOSIS — E538 Deficiency of other specified B group vitamins: Secondary | ICD-10-CM | POA: Diagnosis not present

## 2018-07-14 DIAGNOSIS — E1122 Type 2 diabetes mellitus with diabetic chronic kidney disease: Secondary | ICD-10-CM | POA: Diagnosis not present

## 2018-07-14 DIAGNOSIS — Z8673 Personal history of transient ischemic attack (TIA), and cerebral infarction without residual deficits: Secondary | ICD-10-CM | POA: Diagnosis not present

## 2018-07-14 DIAGNOSIS — N401 Enlarged prostate with lower urinary tract symptoms: Secondary | ICD-10-CM | POA: Diagnosis not present

## 2018-07-14 DIAGNOSIS — I480 Paroxysmal atrial fibrillation: Secondary | ICD-10-CM | POA: Diagnosis not present

## 2018-07-14 DIAGNOSIS — M170 Bilateral primary osteoarthritis of knee: Secondary | ICD-10-CM | POA: Diagnosis not present

## 2018-07-14 DIAGNOSIS — H9190 Unspecified hearing loss, unspecified ear: Secondary | ICD-10-CM | POA: Diagnosis not present

## 2018-07-14 DIAGNOSIS — E7800 Pure hypercholesterolemia, unspecified: Secondary | ICD-10-CM | POA: Diagnosis not present

## 2018-07-14 DIAGNOSIS — R3915 Urgency of urination: Secondary | ICD-10-CM | POA: Diagnosis not present

## 2018-07-14 DIAGNOSIS — I130 Hypertensive heart and chronic kidney disease with heart failure and stage 1 through stage 4 chronic kidney disease, or unspecified chronic kidney disease: Secondary | ICD-10-CM | POA: Diagnosis not present

## 2018-07-14 DIAGNOSIS — N183 Chronic kidney disease, stage 3 (moderate): Secondary | ICD-10-CM | POA: Diagnosis not present

## 2018-07-14 DIAGNOSIS — Z8546 Personal history of malignant neoplasm of prostate: Secondary | ICD-10-CM | POA: Diagnosis not present

## 2018-07-14 DIAGNOSIS — I872 Venous insufficiency (chronic) (peripheral): Secondary | ICD-10-CM | POA: Diagnosis not present

## 2018-07-14 DIAGNOSIS — I5030 Unspecified diastolic (congestive) heart failure: Secondary | ICD-10-CM | POA: Diagnosis not present

## 2018-07-14 DIAGNOSIS — D631 Anemia in chronic kidney disease: Secondary | ICD-10-CM | POA: Diagnosis not present

## 2018-07-18 DIAGNOSIS — D631 Anemia in chronic kidney disease: Secondary | ICD-10-CM | POA: Diagnosis not present

## 2018-07-18 DIAGNOSIS — E1122 Type 2 diabetes mellitus with diabetic chronic kidney disease: Secondary | ICD-10-CM | POA: Diagnosis not present

## 2018-07-18 DIAGNOSIS — L03115 Cellulitis of right lower limb: Secondary | ICD-10-CM | POA: Diagnosis not present

## 2018-07-18 DIAGNOSIS — I130 Hypertensive heart and chronic kidney disease with heart failure and stage 1 through stage 4 chronic kidney disease, or unspecified chronic kidney disease: Secondary | ICD-10-CM | POA: Diagnosis not present

## 2018-07-18 DIAGNOSIS — N183 Chronic kidney disease, stage 3 (moderate): Secondary | ICD-10-CM | POA: Diagnosis not present

## 2018-07-18 DIAGNOSIS — I5030 Unspecified diastolic (congestive) heart failure: Secondary | ICD-10-CM | POA: Diagnosis not present

## 2018-07-24 DIAGNOSIS — I130 Hypertensive heart and chronic kidney disease with heart failure and stage 1 through stage 4 chronic kidney disease, or unspecified chronic kidney disease: Secondary | ICD-10-CM | POA: Diagnosis not present

## 2018-07-24 DIAGNOSIS — D631 Anemia in chronic kidney disease: Secondary | ICD-10-CM | POA: Diagnosis not present

## 2018-07-24 DIAGNOSIS — E1122 Type 2 diabetes mellitus with diabetic chronic kidney disease: Secondary | ICD-10-CM | POA: Diagnosis not present

## 2018-07-24 DIAGNOSIS — N183 Chronic kidney disease, stage 3 (moderate): Secondary | ICD-10-CM | POA: Diagnosis not present

## 2018-07-24 DIAGNOSIS — I5030 Unspecified diastolic (congestive) heart failure: Secondary | ICD-10-CM | POA: Diagnosis not present

## 2018-07-24 DIAGNOSIS — L03115 Cellulitis of right lower limb: Secondary | ICD-10-CM | POA: Diagnosis not present

## 2018-07-26 DIAGNOSIS — I5030 Unspecified diastolic (congestive) heart failure: Secondary | ICD-10-CM | POA: Diagnosis not present

## 2018-07-26 DIAGNOSIS — N183 Chronic kidney disease, stage 3 (moderate): Secondary | ICD-10-CM | POA: Diagnosis not present

## 2018-07-26 DIAGNOSIS — D631 Anemia in chronic kidney disease: Secondary | ICD-10-CM | POA: Diagnosis not present

## 2018-07-26 DIAGNOSIS — L03115 Cellulitis of right lower limb: Secondary | ICD-10-CM | POA: Diagnosis not present

## 2018-07-26 DIAGNOSIS — I130 Hypertensive heart and chronic kidney disease with heart failure and stage 1 through stage 4 chronic kidney disease, or unspecified chronic kidney disease: Secondary | ICD-10-CM | POA: Diagnosis not present

## 2018-07-26 DIAGNOSIS — E1122 Type 2 diabetes mellitus with diabetic chronic kidney disease: Secondary | ICD-10-CM | POA: Diagnosis not present

## 2018-07-30 DIAGNOSIS — I5030 Unspecified diastolic (congestive) heart failure: Secondary | ICD-10-CM | POA: Diagnosis not present

## 2018-07-30 DIAGNOSIS — N183 Chronic kidney disease, stage 3 (moderate): Secondary | ICD-10-CM | POA: Diagnosis not present

## 2018-07-30 DIAGNOSIS — L03115 Cellulitis of right lower limb: Secondary | ICD-10-CM | POA: Diagnosis not present

## 2018-07-30 DIAGNOSIS — E1122 Type 2 diabetes mellitus with diabetic chronic kidney disease: Secondary | ICD-10-CM | POA: Diagnosis not present

## 2018-07-30 DIAGNOSIS — D631 Anemia in chronic kidney disease: Secondary | ICD-10-CM | POA: Diagnosis not present

## 2018-07-30 DIAGNOSIS — I130 Hypertensive heart and chronic kidney disease with heart failure and stage 1 through stage 4 chronic kidney disease, or unspecified chronic kidney disease: Secondary | ICD-10-CM | POA: Diagnosis not present

## 2018-08-01 DIAGNOSIS — E1122 Type 2 diabetes mellitus with diabetic chronic kidney disease: Secondary | ICD-10-CM | POA: Diagnosis not present

## 2018-08-01 DIAGNOSIS — N183 Chronic kidney disease, stage 3 (moderate): Secondary | ICD-10-CM | POA: Diagnosis not present

## 2018-08-01 DIAGNOSIS — D631 Anemia in chronic kidney disease: Secondary | ICD-10-CM | POA: Diagnosis not present

## 2018-08-01 DIAGNOSIS — L03115 Cellulitis of right lower limb: Secondary | ICD-10-CM | POA: Diagnosis not present

## 2018-08-01 DIAGNOSIS — I5030 Unspecified diastolic (congestive) heart failure: Secondary | ICD-10-CM | POA: Diagnosis not present

## 2018-08-01 DIAGNOSIS — I130 Hypertensive heart and chronic kidney disease with heart failure and stage 1 through stage 4 chronic kidney disease, or unspecified chronic kidney disease: Secondary | ICD-10-CM | POA: Diagnosis not present

## 2018-08-06 DIAGNOSIS — E1122 Type 2 diabetes mellitus with diabetic chronic kidney disease: Secondary | ICD-10-CM | POA: Diagnosis not present

## 2018-08-06 DIAGNOSIS — I5030 Unspecified diastolic (congestive) heart failure: Secondary | ICD-10-CM | POA: Diagnosis not present

## 2018-08-06 DIAGNOSIS — N183 Chronic kidney disease, stage 3 (moderate): Secondary | ICD-10-CM | POA: Diagnosis not present

## 2018-08-06 DIAGNOSIS — D631 Anemia in chronic kidney disease: Secondary | ICD-10-CM | POA: Diagnosis not present

## 2018-08-06 DIAGNOSIS — I130 Hypertensive heart and chronic kidney disease with heart failure and stage 1 through stage 4 chronic kidney disease, or unspecified chronic kidney disease: Secondary | ICD-10-CM | POA: Diagnosis not present

## 2018-08-06 DIAGNOSIS — L03115 Cellulitis of right lower limb: Secondary | ICD-10-CM | POA: Diagnosis not present

## 2018-08-09 DIAGNOSIS — E1122 Type 2 diabetes mellitus with diabetic chronic kidney disease: Secondary | ICD-10-CM | POA: Diagnosis not present

## 2018-08-09 DIAGNOSIS — D631 Anemia in chronic kidney disease: Secondary | ICD-10-CM | POA: Diagnosis not present

## 2018-08-09 DIAGNOSIS — I5030 Unspecified diastolic (congestive) heart failure: Secondary | ICD-10-CM | POA: Diagnosis not present

## 2018-08-09 DIAGNOSIS — L03115 Cellulitis of right lower limb: Secondary | ICD-10-CM | POA: Diagnosis not present

## 2018-08-09 DIAGNOSIS — I130 Hypertensive heart and chronic kidney disease with heart failure and stage 1 through stage 4 chronic kidney disease, or unspecified chronic kidney disease: Secondary | ICD-10-CM | POA: Diagnosis not present

## 2018-08-09 DIAGNOSIS — N183 Chronic kidney disease, stage 3 (moderate): Secondary | ICD-10-CM | POA: Diagnosis not present

## 2018-08-13 DIAGNOSIS — L03115 Cellulitis of right lower limb: Secondary | ICD-10-CM | POA: Diagnosis not present

## 2018-08-13 DIAGNOSIS — I5030 Unspecified diastolic (congestive) heart failure: Secondary | ICD-10-CM | POA: Diagnosis not present

## 2018-08-13 DIAGNOSIS — D631 Anemia in chronic kidney disease: Secondary | ICD-10-CM | POA: Diagnosis not present

## 2018-08-13 DIAGNOSIS — I130 Hypertensive heart and chronic kidney disease with heart failure and stage 1 through stage 4 chronic kidney disease, or unspecified chronic kidney disease: Secondary | ICD-10-CM | POA: Diagnosis not present

## 2018-08-13 DIAGNOSIS — N183 Chronic kidney disease, stage 3 (moderate): Secondary | ICD-10-CM | POA: Diagnosis not present

## 2018-08-13 DIAGNOSIS — E1122 Type 2 diabetes mellitus with diabetic chronic kidney disease: Secondary | ICD-10-CM | POA: Diagnosis not present

## 2018-08-16 DIAGNOSIS — E1122 Type 2 diabetes mellitus with diabetic chronic kidney disease: Secondary | ICD-10-CM | POA: Diagnosis not present

## 2018-08-16 DIAGNOSIS — I130 Hypertensive heart and chronic kidney disease with heart failure and stage 1 through stage 4 chronic kidney disease, or unspecified chronic kidney disease: Secondary | ICD-10-CM | POA: Diagnosis not present

## 2018-08-16 DIAGNOSIS — I5030 Unspecified diastolic (congestive) heart failure: Secondary | ICD-10-CM | POA: Diagnosis not present

## 2018-08-16 DIAGNOSIS — L03115 Cellulitis of right lower limb: Secondary | ICD-10-CM | POA: Diagnosis not present

## 2018-08-16 DIAGNOSIS — N183 Chronic kidney disease, stage 3 (moderate): Secondary | ICD-10-CM | POA: Diagnosis not present

## 2018-08-16 DIAGNOSIS — D631 Anemia in chronic kidney disease: Secondary | ICD-10-CM | POA: Diagnosis not present

## 2018-08-20 DIAGNOSIS — I5030 Unspecified diastolic (congestive) heart failure: Secondary | ICD-10-CM | POA: Diagnosis not present

## 2018-08-20 DIAGNOSIS — N183 Chronic kidney disease, stage 3 (moderate): Secondary | ICD-10-CM | POA: Diagnosis not present

## 2018-08-20 DIAGNOSIS — E1122 Type 2 diabetes mellitus with diabetic chronic kidney disease: Secondary | ICD-10-CM | POA: Diagnosis not present

## 2018-08-20 DIAGNOSIS — L03115 Cellulitis of right lower limb: Secondary | ICD-10-CM | POA: Diagnosis not present

## 2018-08-20 DIAGNOSIS — D631 Anemia in chronic kidney disease: Secondary | ICD-10-CM | POA: Diagnosis not present

## 2018-08-20 DIAGNOSIS — I130 Hypertensive heart and chronic kidney disease with heart failure and stage 1 through stage 4 chronic kidney disease, or unspecified chronic kidney disease: Secondary | ICD-10-CM | POA: Diagnosis not present

## 2018-08-23 DIAGNOSIS — E1122 Type 2 diabetes mellitus with diabetic chronic kidney disease: Secondary | ICD-10-CM | POA: Diagnosis not present

## 2018-08-23 DIAGNOSIS — D631 Anemia in chronic kidney disease: Secondary | ICD-10-CM | POA: Diagnosis not present

## 2018-08-23 DIAGNOSIS — I5030 Unspecified diastolic (congestive) heart failure: Secondary | ICD-10-CM | POA: Diagnosis not present

## 2018-08-23 DIAGNOSIS — I130 Hypertensive heart and chronic kidney disease with heart failure and stage 1 through stage 4 chronic kidney disease, or unspecified chronic kidney disease: Secondary | ICD-10-CM | POA: Diagnosis not present

## 2018-08-23 DIAGNOSIS — N183 Chronic kidney disease, stage 3 (moderate): Secondary | ICD-10-CM | POA: Diagnosis not present

## 2018-08-23 DIAGNOSIS — L03115 Cellulitis of right lower limb: Secondary | ICD-10-CM | POA: Diagnosis not present

## 2018-08-27 DIAGNOSIS — D631 Anemia in chronic kidney disease: Secondary | ICD-10-CM | POA: Diagnosis not present

## 2018-08-27 DIAGNOSIS — I130 Hypertensive heart and chronic kidney disease with heart failure and stage 1 through stage 4 chronic kidney disease, or unspecified chronic kidney disease: Secondary | ICD-10-CM | POA: Diagnosis not present

## 2018-08-27 DIAGNOSIS — N183 Chronic kidney disease, stage 3 (moderate): Secondary | ICD-10-CM | POA: Diagnosis not present

## 2018-08-27 DIAGNOSIS — I5030 Unspecified diastolic (congestive) heart failure: Secondary | ICD-10-CM | POA: Diagnosis not present

## 2018-08-27 DIAGNOSIS — L03115 Cellulitis of right lower limb: Secondary | ICD-10-CM | POA: Diagnosis not present

## 2018-08-27 DIAGNOSIS — E1122 Type 2 diabetes mellitus with diabetic chronic kidney disease: Secondary | ICD-10-CM | POA: Diagnosis not present

## 2018-08-30 DIAGNOSIS — N183 Chronic kidney disease, stage 3 (moderate): Secondary | ICD-10-CM | POA: Diagnosis not present

## 2018-08-30 DIAGNOSIS — E1122 Type 2 diabetes mellitus with diabetic chronic kidney disease: Secondary | ICD-10-CM | POA: Diagnosis not present

## 2018-08-30 DIAGNOSIS — D631 Anemia in chronic kidney disease: Secondary | ICD-10-CM | POA: Diagnosis not present

## 2018-08-30 DIAGNOSIS — I5030 Unspecified diastolic (congestive) heart failure: Secondary | ICD-10-CM | POA: Diagnosis not present

## 2018-08-30 DIAGNOSIS — L03115 Cellulitis of right lower limb: Secondary | ICD-10-CM | POA: Diagnosis not present

## 2018-08-30 DIAGNOSIS — I130 Hypertensive heart and chronic kidney disease with heart failure and stage 1 through stage 4 chronic kidney disease, or unspecified chronic kidney disease: Secondary | ICD-10-CM | POA: Diagnosis not present

## 2018-08-31 DIAGNOSIS — N183 Chronic kidney disease, stage 3 (moderate): Secondary | ICD-10-CM | POA: Diagnosis not present

## 2018-08-31 DIAGNOSIS — D631 Anemia in chronic kidney disease: Secondary | ICD-10-CM | POA: Diagnosis not present

## 2018-08-31 DIAGNOSIS — E1122 Type 2 diabetes mellitus with diabetic chronic kidney disease: Secondary | ICD-10-CM | POA: Diagnosis not present

## 2018-08-31 DIAGNOSIS — L03115 Cellulitis of right lower limb: Secondary | ICD-10-CM | POA: Diagnosis not present

## 2018-08-31 DIAGNOSIS — I130 Hypertensive heart and chronic kidney disease with heart failure and stage 1 through stage 4 chronic kidney disease, or unspecified chronic kidney disease: Secondary | ICD-10-CM | POA: Diagnosis not present

## 2018-08-31 DIAGNOSIS — I5030 Unspecified diastolic (congestive) heart failure: Secondary | ICD-10-CM | POA: Diagnosis not present

## 2018-09-03 DIAGNOSIS — I5030 Unspecified diastolic (congestive) heart failure: Secondary | ICD-10-CM | POA: Diagnosis not present

## 2018-09-03 DIAGNOSIS — D631 Anemia in chronic kidney disease: Secondary | ICD-10-CM | POA: Diagnosis not present

## 2018-09-03 DIAGNOSIS — L03115 Cellulitis of right lower limb: Secondary | ICD-10-CM | POA: Diagnosis not present

## 2018-09-03 DIAGNOSIS — I130 Hypertensive heart and chronic kidney disease with heart failure and stage 1 through stage 4 chronic kidney disease, or unspecified chronic kidney disease: Secondary | ICD-10-CM | POA: Diagnosis not present

## 2018-09-03 DIAGNOSIS — N183 Chronic kidney disease, stage 3 (moderate): Secondary | ICD-10-CM | POA: Diagnosis not present

## 2018-09-03 DIAGNOSIS — E1122 Type 2 diabetes mellitus with diabetic chronic kidney disease: Secondary | ICD-10-CM | POA: Diagnosis not present

## 2018-09-06 DIAGNOSIS — D631 Anemia in chronic kidney disease: Secondary | ICD-10-CM | POA: Diagnosis not present

## 2018-09-06 DIAGNOSIS — N183 Chronic kidney disease, stage 3 (moderate): Secondary | ICD-10-CM | POA: Diagnosis not present

## 2018-09-06 DIAGNOSIS — I5030 Unspecified diastolic (congestive) heart failure: Secondary | ICD-10-CM | POA: Diagnosis not present

## 2018-09-06 DIAGNOSIS — L03115 Cellulitis of right lower limb: Secondary | ICD-10-CM | POA: Diagnosis not present

## 2018-09-06 DIAGNOSIS — I130 Hypertensive heart and chronic kidney disease with heart failure and stage 1 through stage 4 chronic kidney disease, or unspecified chronic kidney disease: Secondary | ICD-10-CM | POA: Diagnosis not present

## 2018-09-06 DIAGNOSIS — E1122 Type 2 diabetes mellitus with diabetic chronic kidney disease: Secondary | ICD-10-CM | POA: Diagnosis not present

## 2018-09-10 DIAGNOSIS — L03115 Cellulitis of right lower limb: Secondary | ICD-10-CM | POA: Diagnosis not present

## 2018-09-10 DIAGNOSIS — I5030 Unspecified diastolic (congestive) heart failure: Secondary | ICD-10-CM | POA: Diagnosis not present

## 2018-09-10 DIAGNOSIS — I130 Hypertensive heart and chronic kidney disease with heart failure and stage 1 through stage 4 chronic kidney disease, or unspecified chronic kidney disease: Secondary | ICD-10-CM | POA: Diagnosis not present

## 2018-09-10 DIAGNOSIS — D631 Anemia in chronic kidney disease: Secondary | ICD-10-CM | POA: Diagnosis not present

## 2018-09-10 DIAGNOSIS — N183 Chronic kidney disease, stage 3 (moderate): Secondary | ICD-10-CM | POA: Diagnosis not present

## 2018-09-10 DIAGNOSIS — E1122 Type 2 diabetes mellitus with diabetic chronic kidney disease: Secondary | ICD-10-CM | POA: Diagnosis not present

## 2018-09-19 DIAGNOSIS — E1140 Type 2 diabetes mellitus with diabetic neuropathy, unspecified: Secondary | ICD-10-CM | POA: Diagnosis not present

## 2018-09-19 DIAGNOSIS — E559 Vitamin D deficiency, unspecified: Secondary | ICD-10-CM | POA: Diagnosis not present

## 2018-09-19 DIAGNOSIS — I480 Paroxysmal atrial fibrillation: Secondary | ICD-10-CM | POA: Diagnosis not present

## 2018-09-19 DIAGNOSIS — E1122 Type 2 diabetes mellitus with diabetic chronic kidney disease: Secondary | ICD-10-CM | POA: Diagnosis not present

## 2018-09-19 DIAGNOSIS — N183 Chronic kidney disease, stage 3 (moderate): Secondary | ICD-10-CM | POA: Diagnosis not present

## 2018-09-19 DIAGNOSIS — I872 Venous insufficiency (chronic) (peripheral): Secondary | ICD-10-CM | POA: Diagnosis not present

## 2018-09-19 DIAGNOSIS — I5030 Unspecified diastolic (congestive) heart failure: Secondary | ICD-10-CM | POA: Diagnosis not present

## 2018-09-19 DIAGNOSIS — L89892 Pressure ulcer of other site, stage 2: Secondary | ICD-10-CM | POA: Diagnosis not present

## 2018-09-19 DIAGNOSIS — Z23 Encounter for immunization: Secondary | ICD-10-CM | POA: Diagnosis not present

## 2018-09-19 DIAGNOSIS — D81818 Other biotin-dependent carboxylase deficiency: Secondary | ICD-10-CM | POA: Diagnosis not present

## 2018-09-19 DIAGNOSIS — D531 Other megaloblastic anemias, not elsewhere classified: Secondary | ICD-10-CM | POA: Diagnosis not present

## 2018-09-19 DIAGNOSIS — M170 Bilateral primary osteoarthritis of knee: Secondary | ICD-10-CM | POA: Diagnosis not present

## 2018-10-19 DIAGNOSIS — I872 Venous insufficiency (chronic) (peripheral): Secondary | ICD-10-CM | POA: Diagnosis not present

## 2018-10-19 DIAGNOSIS — I5030 Unspecified diastolic (congestive) heart failure: Secondary | ICD-10-CM | POA: Diagnosis not present

## 2018-10-19 DIAGNOSIS — Z8673 Personal history of transient ischemic attack (TIA), and cerebral infarction without residual deficits: Secondary | ICD-10-CM | POA: Diagnosis not present

## 2018-10-19 DIAGNOSIS — I130 Hypertensive heart and chronic kidney disease with heart failure and stage 1 through stage 4 chronic kidney disease, or unspecified chronic kidney disease: Secondary | ICD-10-CM | POA: Diagnosis not present

## 2018-10-19 DIAGNOSIS — G4733 Obstructive sleep apnea (adult) (pediatric): Secondary | ICD-10-CM | POA: Diagnosis not present

## 2018-10-19 DIAGNOSIS — I480 Paroxysmal atrial fibrillation: Secondary | ICD-10-CM | POA: Diagnosis not present

## 2018-10-19 DIAGNOSIS — E1142 Type 2 diabetes mellitus with diabetic polyneuropathy: Secondary | ICD-10-CM | POA: Diagnosis not present

## 2018-10-19 DIAGNOSIS — E1122 Type 2 diabetes mellitus with diabetic chronic kidney disease: Secondary | ICD-10-CM | POA: Diagnosis not present

## 2018-10-19 DIAGNOSIS — N183 Chronic kidney disease, stage 3 (moderate): Secondary | ICD-10-CM | POA: Diagnosis not present

## 2018-10-19 DIAGNOSIS — Z794 Long term (current) use of insulin: Secondary | ICD-10-CM | POA: Diagnosis not present

## 2018-10-19 DIAGNOSIS — M170 Bilateral primary osteoarthritis of knee: Secondary | ICD-10-CM | POA: Diagnosis not present

## 2018-10-19 DIAGNOSIS — L03115 Cellulitis of right lower limb: Secondary | ICD-10-CM | POA: Diagnosis not present

## 2018-10-19 DIAGNOSIS — N400 Enlarged prostate without lower urinary tract symptoms: Secondary | ICD-10-CM | POA: Diagnosis not present

## 2018-10-19 DIAGNOSIS — E669 Obesity, unspecified: Secondary | ICD-10-CM | POA: Diagnosis not present

## 2018-10-19 DIAGNOSIS — Z9981 Dependence on supplemental oxygen: Secondary | ICD-10-CM | POA: Diagnosis not present

## 2018-10-19 DIAGNOSIS — L03116 Cellulitis of left lower limb: Secondary | ICD-10-CM | POA: Diagnosis not present

## 2018-10-23 DIAGNOSIS — E1142 Type 2 diabetes mellitus with diabetic polyneuropathy: Secondary | ICD-10-CM | POA: Diagnosis not present

## 2018-10-23 DIAGNOSIS — M170 Bilateral primary osteoarthritis of knee: Secondary | ICD-10-CM | POA: Diagnosis not present

## 2018-10-23 DIAGNOSIS — I130 Hypertensive heart and chronic kidney disease with heart failure and stage 1 through stage 4 chronic kidney disease, or unspecified chronic kidney disease: Secondary | ICD-10-CM | POA: Diagnosis not present

## 2018-10-23 DIAGNOSIS — L03115 Cellulitis of right lower limb: Secondary | ICD-10-CM | POA: Diagnosis not present

## 2018-10-23 DIAGNOSIS — I5030 Unspecified diastolic (congestive) heart failure: Secondary | ICD-10-CM | POA: Diagnosis not present

## 2018-10-23 DIAGNOSIS — I872 Venous insufficiency (chronic) (peripheral): Secondary | ICD-10-CM | POA: Diagnosis not present

## 2018-10-26 DIAGNOSIS — L03115 Cellulitis of right lower limb: Secondary | ICD-10-CM | POA: Diagnosis not present

## 2018-10-26 DIAGNOSIS — E1142 Type 2 diabetes mellitus with diabetic polyneuropathy: Secondary | ICD-10-CM | POA: Diagnosis not present

## 2018-10-26 DIAGNOSIS — M170 Bilateral primary osteoarthritis of knee: Secondary | ICD-10-CM | POA: Diagnosis not present

## 2018-10-26 DIAGNOSIS — I130 Hypertensive heart and chronic kidney disease with heart failure and stage 1 through stage 4 chronic kidney disease, or unspecified chronic kidney disease: Secondary | ICD-10-CM | POA: Diagnosis not present

## 2018-10-26 DIAGNOSIS — I5030 Unspecified diastolic (congestive) heart failure: Secondary | ICD-10-CM | POA: Diagnosis not present

## 2018-10-26 DIAGNOSIS — I872 Venous insufficiency (chronic) (peripheral): Secondary | ICD-10-CM | POA: Diagnosis not present

## 2018-10-29 DIAGNOSIS — I130 Hypertensive heart and chronic kidney disease with heart failure and stage 1 through stage 4 chronic kidney disease, or unspecified chronic kidney disease: Secondary | ICD-10-CM | POA: Diagnosis not present

## 2018-10-29 DIAGNOSIS — I5030 Unspecified diastolic (congestive) heart failure: Secondary | ICD-10-CM | POA: Diagnosis not present

## 2018-10-29 DIAGNOSIS — M170 Bilateral primary osteoarthritis of knee: Secondary | ICD-10-CM | POA: Diagnosis not present

## 2018-10-29 DIAGNOSIS — I872 Venous insufficiency (chronic) (peripheral): Secondary | ICD-10-CM | POA: Diagnosis not present

## 2018-10-29 DIAGNOSIS — L03115 Cellulitis of right lower limb: Secondary | ICD-10-CM | POA: Diagnosis not present

## 2018-10-29 DIAGNOSIS — E1142 Type 2 diabetes mellitus with diabetic polyneuropathy: Secondary | ICD-10-CM | POA: Diagnosis not present

## 2018-10-31 DIAGNOSIS — I872 Venous insufficiency (chronic) (peripheral): Secondary | ICD-10-CM | POA: Diagnosis not present

## 2018-10-31 DIAGNOSIS — L03115 Cellulitis of right lower limb: Secondary | ICD-10-CM | POA: Diagnosis not present

## 2018-10-31 DIAGNOSIS — I130 Hypertensive heart and chronic kidney disease with heart failure and stage 1 through stage 4 chronic kidney disease, or unspecified chronic kidney disease: Secondary | ICD-10-CM | POA: Diagnosis not present

## 2018-10-31 DIAGNOSIS — E1142 Type 2 diabetes mellitus with diabetic polyneuropathy: Secondary | ICD-10-CM | POA: Diagnosis not present

## 2018-10-31 DIAGNOSIS — I5030 Unspecified diastolic (congestive) heart failure: Secondary | ICD-10-CM | POA: Diagnosis not present

## 2018-10-31 DIAGNOSIS — M170 Bilateral primary osteoarthritis of knee: Secondary | ICD-10-CM | POA: Diagnosis not present

## 2018-11-05 DIAGNOSIS — E1142 Type 2 diabetes mellitus with diabetic polyneuropathy: Secondary | ICD-10-CM | POA: Diagnosis not present

## 2018-11-05 DIAGNOSIS — I5030 Unspecified diastolic (congestive) heart failure: Secondary | ICD-10-CM | POA: Diagnosis not present

## 2018-11-05 DIAGNOSIS — M170 Bilateral primary osteoarthritis of knee: Secondary | ICD-10-CM | POA: Diagnosis not present

## 2018-11-05 DIAGNOSIS — L03115 Cellulitis of right lower limb: Secondary | ICD-10-CM | POA: Diagnosis not present

## 2018-11-05 DIAGNOSIS — I130 Hypertensive heart and chronic kidney disease with heart failure and stage 1 through stage 4 chronic kidney disease, or unspecified chronic kidney disease: Secondary | ICD-10-CM | POA: Diagnosis not present

## 2018-11-05 DIAGNOSIS — I872 Venous insufficiency (chronic) (peripheral): Secondary | ICD-10-CM | POA: Diagnosis not present

## 2018-11-07 DIAGNOSIS — E1142 Type 2 diabetes mellitus with diabetic polyneuropathy: Secondary | ICD-10-CM | POA: Diagnosis not present

## 2018-11-07 DIAGNOSIS — M170 Bilateral primary osteoarthritis of knee: Secondary | ICD-10-CM | POA: Diagnosis not present

## 2018-11-07 DIAGNOSIS — I872 Venous insufficiency (chronic) (peripheral): Secondary | ICD-10-CM | POA: Diagnosis not present

## 2018-11-07 DIAGNOSIS — L03115 Cellulitis of right lower limb: Secondary | ICD-10-CM | POA: Diagnosis not present

## 2018-11-07 DIAGNOSIS — I130 Hypertensive heart and chronic kidney disease with heart failure and stage 1 through stage 4 chronic kidney disease, or unspecified chronic kidney disease: Secondary | ICD-10-CM | POA: Diagnosis not present

## 2018-11-07 DIAGNOSIS — I5030 Unspecified diastolic (congestive) heart failure: Secondary | ICD-10-CM | POA: Diagnosis not present

## 2018-11-12 DIAGNOSIS — I130 Hypertensive heart and chronic kidney disease with heart failure and stage 1 through stage 4 chronic kidney disease, or unspecified chronic kidney disease: Secondary | ICD-10-CM | POA: Diagnosis not present

## 2018-11-12 DIAGNOSIS — L03115 Cellulitis of right lower limb: Secondary | ICD-10-CM | POA: Diagnosis not present

## 2018-11-12 DIAGNOSIS — M170 Bilateral primary osteoarthritis of knee: Secondary | ICD-10-CM | POA: Diagnosis not present

## 2018-11-12 DIAGNOSIS — E1142 Type 2 diabetes mellitus with diabetic polyneuropathy: Secondary | ICD-10-CM | POA: Diagnosis not present

## 2018-11-12 DIAGNOSIS — I5030 Unspecified diastolic (congestive) heart failure: Secondary | ICD-10-CM | POA: Diagnosis not present

## 2018-11-12 DIAGNOSIS — I872 Venous insufficiency (chronic) (peripheral): Secondary | ICD-10-CM | POA: Diagnosis not present

## 2018-11-14 DIAGNOSIS — I5030 Unspecified diastolic (congestive) heart failure: Secondary | ICD-10-CM | POA: Diagnosis not present

## 2018-11-14 DIAGNOSIS — I130 Hypertensive heart and chronic kidney disease with heart failure and stage 1 through stage 4 chronic kidney disease, or unspecified chronic kidney disease: Secondary | ICD-10-CM | POA: Diagnosis not present

## 2018-11-14 DIAGNOSIS — M170 Bilateral primary osteoarthritis of knee: Secondary | ICD-10-CM | POA: Diagnosis not present

## 2018-11-14 DIAGNOSIS — I872 Venous insufficiency (chronic) (peripheral): Secondary | ICD-10-CM | POA: Diagnosis not present

## 2018-11-14 DIAGNOSIS — L03115 Cellulitis of right lower limb: Secondary | ICD-10-CM | POA: Diagnosis not present

## 2018-11-14 DIAGNOSIS — E1142 Type 2 diabetes mellitus with diabetic polyneuropathy: Secondary | ICD-10-CM | POA: Diagnosis not present

## 2018-11-19 DIAGNOSIS — E1142 Type 2 diabetes mellitus with diabetic polyneuropathy: Secondary | ICD-10-CM | POA: Diagnosis not present

## 2018-11-19 DIAGNOSIS — M170 Bilateral primary osteoarthritis of knee: Secondary | ICD-10-CM | POA: Diagnosis not present

## 2018-11-19 DIAGNOSIS — I130 Hypertensive heart and chronic kidney disease with heart failure and stage 1 through stage 4 chronic kidney disease, or unspecified chronic kidney disease: Secondary | ICD-10-CM | POA: Diagnosis not present

## 2018-11-19 DIAGNOSIS — I5030 Unspecified diastolic (congestive) heart failure: Secondary | ICD-10-CM | POA: Diagnosis not present

## 2018-11-19 DIAGNOSIS — L03115 Cellulitis of right lower limb: Secondary | ICD-10-CM | POA: Diagnosis not present

## 2018-11-19 DIAGNOSIS — I872 Venous insufficiency (chronic) (peripheral): Secondary | ICD-10-CM | POA: Diagnosis not present

## 2018-11-22 DIAGNOSIS — I5030 Unspecified diastolic (congestive) heart failure: Secondary | ICD-10-CM | POA: Diagnosis not present

## 2018-11-22 DIAGNOSIS — L03115 Cellulitis of right lower limb: Secondary | ICD-10-CM | POA: Diagnosis not present

## 2018-11-22 DIAGNOSIS — M170 Bilateral primary osteoarthritis of knee: Secondary | ICD-10-CM | POA: Diagnosis not present

## 2018-11-22 DIAGNOSIS — E1142 Type 2 diabetes mellitus with diabetic polyneuropathy: Secondary | ICD-10-CM | POA: Diagnosis not present

## 2018-11-22 DIAGNOSIS — I872 Venous insufficiency (chronic) (peripheral): Secondary | ICD-10-CM | POA: Diagnosis not present

## 2018-11-22 DIAGNOSIS — I130 Hypertensive heart and chronic kidney disease with heart failure and stage 1 through stage 4 chronic kidney disease, or unspecified chronic kidney disease: Secondary | ICD-10-CM | POA: Diagnosis not present

## 2018-11-30 DIAGNOSIS — M170 Bilateral primary osteoarthritis of knee: Secondary | ICD-10-CM | POA: Diagnosis not present

## 2018-11-30 DIAGNOSIS — I872 Venous insufficiency (chronic) (peripheral): Secondary | ICD-10-CM | POA: Diagnosis not present

## 2018-11-30 DIAGNOSIS — E1142 Type 2 diabetes mellitus with diabetic polyneuropathy: Secondary | ICD-10-CM | POA: Diagnosis not present

## 2018-11-30 DIAGNOSIS — I130 Hypertensive heart and chronic kidney disease with heart failure and stage 1 through stage 4 chronic kidney disease, or unspecified chronic kidney disease: Secondary | ICD-10-CM | POA: Diagnosis not present

## 2018-11-30 DIAGNOSIS — I5030 Unspecified diastolic (congestive) heart failure: Secondary | ICD-10-CM | POA: Diagnosis not present

## 2018-11-30 DIAGNOSIS — L03115 Cellulitis of right lower limb: Secondary | ICD-10-CM | POA: Diagnosis not present

## 2018-12-02 DIAGNOSIS — I130 Hypertensive heart and chronic kidney disease with heart failure and stage 1 through stage 4 chronic kidney disease, or unspecified chronic kidney disease: Secondary | ICD-10-CM | POA: Diagnosis not present

## 2018-12-02 DIAGNOSIS — M170 Bilateral primary osteoarthritis of knee: Secondary | ICD-10-CM | POA: Diagnosis not present

## 2018-12-02 DIAGNOSIS — E1142 Type 2 diabetes mellitus with diabetic polyneuropathy: Secondary | ICD-10-CM | POA: Diagnosis not present

## 2018-12-02 DIAGNOSIS — L03115 Cellulitis of right lower limb: Secondary | ICD-10-CM | POA: Diagnosis not present

## 2018-12-02 DIAGNOSIS — I872 Venous insufficiency (chronic) (peripheral): Secondary | ICD-10-CM | POA: Diagnosis not present

## 2018-12-02 DIAGNOSIS — I5030 Unspecified diastolic (congestive) heart failure: Secondary | ICD-10-CM | POA: Diagnosis not present

## 2018-12-04 DIAGNOSIS — L03115 Cellulitis of right lower limb: Secondary | ICD-10-CM | POA: Diagnosis not present

## 2018-12-04 DIAGNOSIS — I130 Hypertensive heart and chronic kidney disease with heart failure and stage 1 through stage 4 chronic kidney disease, or unspecified chronic kidney disease: Secondary | ICD-10-CM | POA: Diagnosis not present

## 2018-12-04 DIAGNOSIS — I872 Venous insufficiency (chronic) (peripheral): Secondary | ICD-10-CM | POA: Diagnosis not present

## 2018-12-04 DIAGNOSIS — E1142 Type 2 diabetes mellitus with diabetic polyneuropathy: Secondary | ICD-10-CM | POA: Diagnosis not present

## 2018-12-04 DIAGNOSIS — I5030 Unspecified diastolic (congestive) heart failure: Secondary | ICD-10-CM | POA: Diagnosis not present

## 2018-12-04 DIAGNOSIS — M170 Bilateral primary osteoarthritis of knee: Secondary | ICD-10-CM | POA: Diagnosis not present

## 2018-12-06 DIAGNOSIS — M170 Bilateral primary osteoarthritis of knee: Secondary | ICD-10-CM | POA: Diagnosis not present

## 2018-12-06 DIAGNOSIS — L03115 Cellulitis of right lower limb: Secondary | ICD-10-CM | POA: Diagnosis not present

## 2018-12-06 DIAGNOSIS — I872 Venous insufficiency (chronic) (peripheral): Secondary | ICD-10-CM | POA: Diagnosis not present

## 2018-12-06 DIAGNOSIS — E1142 Type 2 diabetes mellitus with diabetic polyneuropathy: Secondary | ICD-10-CM | POA: Diagnosis not present

## 2018-12-06 DIAGNOSIS — I5030 Unspecified diastolic (congestive) heart failure: Secondary | ICD-10-CM | POA: Diagnosis not present

## 2018-12-06 DIAGNOSIS — I130 Hypertensive heart and chronic kidney disease with heart failure and stage 1 through stage 4 chronic kidney disease, or unspecified chronic kidney disease: Secondary | ICD-10-CM | POA: Diagnosis not present

## 2018-12-09 DIAGNOSIS — M170 Bilateral primary osteoarthritis of knee: Secondary | ICD-10-CM | POA: Diagnosis not present

## 2018-12-09 DIAGNOSIS — E1142 Type 2 diabetes mellitus with diabetic polyneuropathy: Secondary | ICD-10-CM | POA: Diagnosis not present

## 2018-12-09 DIAGNOSIS — I130 Hypertensive heart and chronic kidney disease with heart failure and stage 1 through stage 4 chronic kidney disease, or unspecified chronic kidney disease: Secondary | ICD-10-CM | POA: Diagnosis not present

## 2018-12-09 DIAGNOSIS — I872 Venous insufficiency (chronic) (peripheral): Secondary | ICD-10-CM | POA: Diagnosis not present

## 2018-12-09 DIAGNOSIS — I5030 Unspecified diastolic (congestive) heart failure: Secondary | ICD-10-CM | POA: Diagnosis not present

## 2018-12-09 DIAGNOSIS — L03115 Cellulitis of right lower limb: Secondary | ICD-10-CM | POA: Diagnosis not present

## 2018-12-11 DIAGNOSIS — L03115 Cellulitis of right lower limb: Secondary | ICD-10-CM | POA: Diagnosis not present

## 2018-12-11 DIAGNOSIS — I872 Venous insufficiency (chronic) (peripheral): Secondary | ICD-10-CM | POA: Diagnosis not present

## 2018-12-11 DIAGNOSIS — M170 Bilateral primary osteoarthritis of knee: Secondary | ICD-10-CM | POA: Diagnosis not present

## 2018-12-11 DIAGNOSIS — I130 Hypertensive heart and chronic kidney disease with heart failure and stage 1 through stage 4 chronic kidney disease, or unspecified chronic kidney disease: Secondary | ICD-10-CM | POA: Diagnosis not present

## 2018-12-11 DIAGNOSIS — E1142 Type 2 diabetes mellitus with diabetic polyneuropathy: Secondary | ICD-10-CM | POA: Diagnosis not present

## 2018-12-11 DIAGNOSIS — I5030 Unspecified diastolic (congestive) heart failure: Secondary | ICD-10-CM | POA: Diagnosis not present

## 2018-12-13 DIAGNOSIS — I872 Venous insufficiency (chronic) (peripheral): Secondary | ICD-10-CM | POA: Diagnosis not present

## 2018-12-13 DIAGNOSIS — I130 Hypertensive heart and chronic kidney disease with heart failure and stage 1 through stage 4 chronic kidney disease, or unspecified chronic kidney disease: Secondary | ICD-10-CM | POA: Diagnosis not present

## 2018-12-13 DIAGNOSIS — E1142 Type 2 diabetes mellitus with diabetic polyneuropathy: Secondary | ICD-10-CM | POA: Diagnosis not present

## 2018-12-13 DIAGNOSIS — M170 Bilateral primary osteoarthritis of knee: Secondary | ICD-10-CM | POA: Diagnosis not present

## 2018-12-13 DIAGNOSIS — L03115 Cellulitis of right lower limb: Secondary | ICD-10-CM | POA: Diagnosis not present

## 2018-12-13 DIAGNOSIS — I5030 Unspecified diastolic (congestive) heart failure: Secondary | ICD-10-CM | POA: Diagnosis not present

## 2018-12-16 DIAGNOSIS — L03115 Cellulitis of right lower limb: Secondary | ICD-10-CM | POA: Diagnosis not present

## 2018-12-16 DIAGNOSIS — I130 Hypertensive heart and chronic kidney disease with heart failure and stage 1 through stage 4 chronic kidney disease, or unspecified chronic kidney disease: Secondary | ICD-10-CM | POA: Diagnosis not present

## 2018-12-16 DIAGNOSIS — M170 Bilateral primary osteoarthritis of knee: Secondary | ICD-10-CM | POA: Diagnosis not present

## 2018-12-16 DIAGNOSIS — E1142 Type 2 diabetes mellitus with diabetic polyneuropathy: Secondary | ICD-10-CM | POA: Diagnosis not present

## 2018-12-16 DIAGNOSIS — I872 Venous insufficiency (chronic) (peripheral): Secondary | ICD-10-CM | POA: Diagnosis not present

## 2018-12-16 DIAGNOSIS — I5030 Unspecified diastolic (congestive) heart failure: Secondary | ICD-10-CM | POA: Diagnosis not present

## 2019-01-30 DIAGNOSIS — E1165 Type 2 diabetes mellitus with hyperglycemia: Secondary | ICD-10-CM | POA: Diagnosis not present

## 2019-01-30 DIAGNOSIS — E669 Obesity, unspecified: Secondary | ICD-10-CM | POA: Diagnosis not present

## 2019-01-30 DIAGNOSIS — N400 Enlarged prostate without lower urinary tract symptoms: Secondary | ICD-10-CM | POA: Diagnosis not present

## 2019-01-30 DIAGNOSIS — B351 Tinea unguium: Secondary | ICD-10-CM | POA: Diagnosis not present

## 2019-01-30 DIAGNOSIS — G4733 Obstructive sleep apnea (adult) (pediatric): Secondary | ICD-10-CM | POA: Diagnosis not present

## 2019-01-30 DIAGNOSIS — E1142 Type 2 diabetes mellitus with diabetic polyneuropathy: Secondary | ICD-10-CM | POA: Diagnosis not present

## 2019-01-30 DIAGNOSIS — I130 Hypertensive heart and chronic kidney disease with heart failure and stage 1 through stage 4 chronic kidney disease, or unspecified chronic kidney disease: Secondary | ICD-10-CM | POA: Diagnosis not present

## 2019-01-30 DIAGNOSIS — E1122 Type 2 diabetes mellitus with diabetic chronic kidney disease: Secondary | ICD-10-CM | POA: Diagnosis not present

## 2019-01-30 DIAGNOSIS — I480 Paroxysmal atrial fibrillation: Secondary | ICD-10-CM | POA: Diagnosis not present

## 2019-01-30 DIAGNOSIS — I872 Venous insufficiency (chronic) (peripheral): Secondary | ICD-10-CM | POA: Diagnosis not present

## 2019-01-30 DIAGNOSIS — I5030 Unspecified diastolic (congestive) heart failure: Secondary | ICD-10-CM | POA: Diagnosis not present

## 2019-01-30 DIAGNOSIS — M170 Bilateral primary osteoarthritis of knee: Secondary | ICD-10-CM | POA: Diagnosis not present

## 2019-03-05 DIAGNOSIS — N390 Urinary tract infection, site not specified: Secondary | ICD-10-CM | POA: Diagnosis not present

## 2019-03-05 DIAGNOSIS — R319 Hematuria, unspecified: Secondary | ICD-10-CM | POA: Diagnosis not present

## 2019-04-03 DIAGNOSIS — I130 Hypertensive heart and chronic kidney disease with heart failure and stage 1 through stage 4 chronic kidney disease, or unspecified chronic kidney disease: Secondary | ICD-10-CM | POA: Diagnosis not present

## 2019-04-08 DIAGNOSIS — J310 Chronic rhinitis: Secondary | ICD-10-CM | POA: Diagnosis not present

## 2019-04-08 DIAGNOSIS — Z139 Encounter for screening, unspecified: Secondary | ICD-10-CM | POA: Diagnosis not present

## 2019-05-31 DIAGNOSIS — R509 Fever, unspecified: Secondary | ICD-10-CM | POA: Diagnosis not present

## 2019-05-31 DIAGNOSIS — R0602 Shortness of breath: Secondary | ICD-10-CM | POA: Diagnosis not present

## 2019-06-02 ENCOUNTER — Emergency Department (HOSPITAL_COMMUNITY): Payer: Medicare Other

## 2019-06-02 ENCOUNTER — Encounter (HOSPITAL_COMMUNITY): Payer: Self-pay

## 2019-06-02 ENCOUNTER — Inpatient Hospital Stay (HOSPITAL_COMMUNITY)
Admission: EM | Admit: 2019-06-02 | Discharge: 2019-06-14 | DRG: 641 | Disposition: A | Discharge status: Skilled Nursing Facility | Payer: Medicare Other | Attending: Internal Medicine | Admitting: Internal Medicine

## 2019-06-02 DIAGNOSIS — K219 Gastro-esophageal reflux disease without esophagitis: Secondary | ICD-10-CM | POA: Diagnosis present

## 2019-06-02 DIAGNOSIS — R509 Fever, unspecified: Secondary | ICD-10-CM | POA: Diagnosis not present

## 2019-06-02 DIAGNOSIS — I10 Essential (primary) hypertension: Secondary | ICD-10-CM | POA: Diagnosis present

## 2019-06-02 DIAGNOSIS — N179 Acute kidney failure, unspecified: Secondary | ICD-10-CM | POA: Diagnosis not present

## 2019-06-02 DIAGNOSIS — N281 Cyst of kidney, acquired: Secondary | ICD-10-CM | POA: Diagnosis not present

## 2019-06-02 DIAGNOSIS — R52 Pain, unspecified: Secondary | ICD-10-CM | POA: Diagnosis not present

## 2019-06-02 DIAGNOSIS — Z1159 Encounter for screening for other viral diseases: Secondary | ICD-10-CM

## 2019-06-02 DIAGNOSIS — Z978 Presence of other specified devices: Secondary | ICD-10-CM

## 2019-06-02 DIAGNOSIS — I130 Hypertensive heart and chronic kidney disease with heart failure and stage 1 through stage 4 chronic kidney disease, or unspecified chronic kidney disease: Secondary | ICD-10-CM | POA: Diagnosis present

## 2019-06-02 DIAGNOSIS — Z794 Long term (current) use of insulin: Secondary | ICD-10-CM

## 2019-06-02 DIAGNOSIS — I959 Hypotension, unspecified: Secondary | ICD-10-CM | POA: Diagnosis not present

## 2019-06-02 DIAGNOSIS — E1142 Type 2 diabetes mellitus with diabetic polyneuropathy: Secondary | ICD-10-CM | POA: Diagnosis not present

## 2019-06-02 DIAGNOSIS — N400 Enlarged prostate without lower urinary tract symptoms: Secondary | ICD-10-CM | POA: Diagnosis present

## 2019-06-02 DIAGNOSIS — N183 Chronic kidney disease, stage 3 unspecified: Secondary | ICD-10-CM | POA: Diagnosis present

## 2019-06-02 DIAGNOSIS — I480 Paroxysmal atrial fibrillation: Secondary | ICD-10-CM | POA: Diagnosis present

## 2019-06-02 DIAGNOSIS — R140 Abdominal distension (gaseous): Secondary | ICD-10-CM

## 2019-06-02 DIAGNOSIS — K5900 Constipation, unspecified: Secondary | ICD-10-CM | POA: Diagnosis present

## 2019-06-02 DIAGNOSIS — D649 Anemia, unspecified: Secondary | ICD-10-CM | POA: Diagnosis not present

## 2019-06-02 DIAGNOSIS — R404 Transient alteration of awareness: Secondary | ICD-10-CM | POA: Diagnosis not present

## 2019-06-02 DIAGNOSIS — Z66 Do not resuscitate: Secondary | ICD-10-CM | POA: Diagnosis present

## 2019-06-02 DIAGNOSIS — R109 Unspecified abdominal pain: Secondary | ICD-10-CM | POA: Diagnosis not present

## 2019-06-02 DIAGNOSIS — E860 Dehydration: Secondary | ICD-10-CM | POA: Diagnosis present

## 2019-06-02 DIAGNOSIS — K311 Adult hypertrophic pyloric stenosis: Secondary | ICD-10-CM | POA: Diagnosis present

## 2019-06-02 DIAGNOSIS — E119 Type 2 diabetes mellitus without complications: Secondary | ICD-10-CM

## 2019-06-02 DIAGNOSIS — Z7902 Long term (current) use of antithrombotics/antiplatelets: Secondary | ICD-10-CM

## 2019-06-02 DIAGNOSIS — E785 Hyperlipidemia, unspecified: Secondary | ICD-10-CM | POA: Diagnosis present

## 2019-06-02 DIAGNOSIS — Z79899 Other long term (current) drug therapy: Secondary | ICD-10-CM

## 2019-06-02 DIAGNOSIS — I5032 Chronic diastolic (congestive) heart failure: Secondary | ICD-10-CM | POA: Diagnosis not present

## 2019-06-02 DIAGNOSIS — Z8546 Personal history of malignant neoplasm of prostate: Secondary | ICD-10-CM

## 2019-06-02 DIAGNOSIS — Z20828 Contact with and (suspected) exposure to other viral communicable diseases: Secondary | ICD-10-CM | POA: Diagnosis not present

## 2019-06-02 DIAGNOSIS — R1084 Generalized abdominal pain: Secondary | ICD-10-CM | POA: Diagnosis not present

## 2019-06-02 DIAGNOSIS — Z888 Allergy status to other drugs, medicaments and biological substances status: Secondary | ICD-10-CM

## 2019-06-02 DIAGNOSIS — J9811 Atelectasis: Secondary | ICD-10-CM | POA: Diagnosis not present

## 2019-06-02 DIAGNOSIS — E1122 Type 2 diabetes mellitus with diabetic chronic kidney disease: Secondary | ICD-10-CM | POA: Diagnosis present

## 2019-06-02 DIAGNOSIS — G8929 Other chronic pain: Secondary | ICD-10-CM | POA: Diagnosis present

## 2019-06-02 DIAGNOSIS — Z923 Personal history of irradiation: Secondary | ICD-10-CM

## 2019-06-02 DIAGNOSIS — T502X5A Adverse effect of carbonic-anhydrase inhibitors, benzothiadiazides and other diuretics, initial encounter: Secondary | ICD-10-CM | POA: Diagnosis present

## 2019-06-02 DIAGNOSIS — R0902 Hypoxemia: Secondary | ICD-10-CM | POA: Diagnosis not present

## 2019-06-02 DIAGNOSIS — E11649 Type 2 diabetes mellitus with hypoglycemia without coma: Secondary | ICD-10-CM | POA: Diagnosis present

## 2019-06-02 DIAGNOSIS — E876 Hypokalemia: Secondary | ICD-10-CM | POA: Diagnosis not present

## 2019-06-02 DIAGNOSIS — G4733 Obstructive sleep apnea (adult) (pediatric): Secondary | ICD-10-CM | POA: Diagnosis present

## 2019-06-02 DIAGNOSIS — R001 Bradycardia, unspecified: Secondary | ICD-10-CM | POA: Diagnosis not present

## 2019-06-02 DIAGNOSIS — Z7982 Long term (current) use of aspirin: Secondary | ICD-10-CM

## 2019-06-02 DIAGNOSIS — E7800 Pure hypercholesterolemia, unspecified: Secondary | ICD-10-CM | POA: Diagnosis present

## 2019-06-02 DIAGNOSIS — N200 Calculus of kidney: Secondary | ICD-10-CM | POA: Diagnosis not present

## 2019-06-02 DIAGNOSIS — Z8673 Personal history of transient ischemic attack (TIA), and cerebral infarction without residual deficits: Secondary | ICD-10-CM

## 2019-06-02 LAB — COMPREHENSIVE METABOLIC PANEL
ALT: 19 U/L (ref 0–44)
AST: 25 U/L (ref 15–41)
Albumin: 3.9 g/dL (ref 3.5–5.0)
Alkaline Phosphatase: 74 U/L (ref 38–126)
Anion gap: 16 — ABNORMAL HIGH (ref 5–15)
BUN: 29 mg/dL — ABNORMAL HIGH (ref 8–23)
CO2: 33 mmol/L — ABNORMAL HIGH (ref 22–32)
Calcium: 9.2 mg/dL (ref 8.9–10.3)
Chloride: 91 mmol/L — ABNORMAL LOW (ref 98–111)
Creatinine, Ser: 1.88 mg/dL — ABNORMAL HIGH (ref 0.61–1.24)
GFR calc Af Amer: 37 mL/min — ABNORMAL LOW (ref >60)
GFR calc non Af Amer: 32 mL/min — ABNORMAL LOW (ref >60)
Glucose, Bld: 123 mg/dL — ABNORMAL HIGH (ref 70–99)
Potassium: 2.3 mmol/L — CL (ref 3.5–5.1)
Sodium: 140 mmol/L (ref 135–145)
Total Bilirubin: 0.8 mg/dL (ref 0.3–1.2)
Total Protein: 7.3 g/dL (ref 6.5–8.1)

## 2019-06-02 LAB — SARS CORONAVIRUS 2 BY RT PCR (HOSPITAL ORDER, PERFORMED IN ~~LOC~~ HOSPITAL LAB): SARS Coronavirus 2: NEGATIVE

## 2019-06-02 LAB — CBC
HCT: 37 % — ABNORMAL LOW (ref 39.0–52.0)
Hemoglobin: 11.2 g/dL — ABNORMAL LOW (ref 13.0–17.0)
MCH: 21.5 pg — ABNORMAL LOW (ref 26.0–34.0)
MCHC: 30.3 g/dL (ref 30.0–36.0)
MCV: 71 fL — ABNORMAL LOW (ref 80.0–100.0)
Platelets: 267 10*3/uL (ref 150–400)
RBC: 5.21 MIL/uL (ref 4.22–5.81)
RDW: 15.4 % (ref 11.5–15.5)
WBC: 10.2 10*3/uL (ref 4.0–10.5)
nRBC: 0 % (ref 0.0–0.2)

## 2019-06-02 LAB — LIPASE, BLOOD: Lipase: 29 U/L (ref 11–51)

## 2019-06-02 LAB — MAGNESIUM: Magnesium: 1.7 mg/dL (ref 1.7–2.4)

## 2019-06-02 MED ORDER — ENOXAPARIN SODIUM 40 MG/0.4ML ~~LOC~~ SOLN: 40.0000 mg | SUBCUTANEOUS | Status: DC

## 2019-06-02 MED ORDER — INSULIN ASPART 100 UNIT/ML ~~LOC~~ SOLN
0.0000 [IU] | Freq: Every day | SUBCUTANEOUS | Status: DC
  Filled 2019-06-02: qty 0.05

## 2019-06-02 MED ORDER — SODIUM CHLORIDE 0.9 % IV SOLN: 250.0000 mL | INTRAVENOUS | Status: DC | PRN

## 2019-06-02 MED ORDER — POTASSIUM CHLORIDE 10 MEQ/100ML IV SOLN
10.0000 meq | INTRAVENOUS | Status: AC
  Administered 2019-06-02 – 2019-06-03 (×6): 10 meq via INTRAVENOUS
  Filled 2019-06-02 (×4): qty 100

## 2019-06-02 MED ORDER — SODIUM CHLORIDE 0.9 % IV SOLN
INTRAVENOUS | Status: DC
  Administered 2019-06-02: 17:00:00 via INTRAVENOUS

## 2019-06-02 MED ORDER — INSULIN ASPART 100 UNIT/ML ~~LOC~~ SOLN
0.0000 [IU] | Freq: Three times a day (TID) | SUBCUTANEOUS | Status: DC
  Administered 2019-06-03: 13:00:00 1 [IU] via SUBCUTANEOUS
  Administered 2019-06-04: 2 [IU] via SUBCUTANEOUS
  Administered 2019-06-05: 1 [IU] via SUBCUTANEOUS
  Administered 2019-06-06: 2 [IU] via SUBCUTANEOUS
  Administered 2019-06-06: 13:00:00 1 [IU] via SUBCUTANEOUS
  Administered 2019-06-07: 2 [IU] via SUBCUTANEOUS
  Administered 2019-06-07: 18:00:00 1 [IU] via SUBCUTANEOUS
  Administered 2019-06-07 – 2019-06-08 (×2): 2 [IU] via SUBCUTANEOUS
  Administered 2019-06-08: 08:00:00 1 [IU] via SUBCUTANEOUS
  Filled 2019-06-02: qty 0.09

## 2019-06-02 MED ORDER — ENOXAPARIN SODIUM 30 MG/0.3ML ~~LOC~~ SOLN
30.0000 mg | Freq: Every day | SUBCUTANEOUS | Status: DC
  Filled 2019-06-02: qty 0.3

## 2019-06-02 MED ORDER — SODIUM CHLORIDE 0.9% FLUSH: 3.0000 mL | INTRAVENOUS | Status: DC | PRN

## 2019-06-02 MED ORDER — POTASSIUM CHLORIDE IN NACL 20-0.9 MEQ/L-% IV SOLN
Freq: Once | INTRAVENOUS | Status: AC
  Administered 2019-06-02: 18:00:00 via INTRAVENOUS
  Filled 2019-06-02: qty 1000

## 2019-06-02 MED ORDER — ACETAMINOPHEN 650 MG RE SUPP: 650.0000 mg | Freq: Four times a day (QID) | RECTAL | Status: DC | PRN

## 2019-06-02 MED ORDER — SODIUM CHLORIDE 0.9% FLUSH
3.0000 mL | Freq: Two times a day (BID) | INTRAVENOUS | Status: DC
  Administered 2019-06-03 – 2019-06-14 (×14): 3 mL via INTRAVENOUS

## 2019-06-02 MED ORDER — ACETAMINOPHEN 325 MG PO TABS
650.0000 mg | ORAL_TABLET | Freq: Four times a day (QID) | ORAL | Status: DC | PRN
  Administered 2019-06-07: 650 mg via ORAL
  Filled 2019-06-02: qty 2

## 2019-06-02 MED ORDER — ONDANSETRON HCL 4 MG/2ML IJ SOLN
4.0000 mg | Freq: Once | INTRAMUSCULAR | Status: AC
  Administered 2019-06-02: 4 mg via INTRAVENOUS
  Filled 2019-06-02: qty 2

## 2019-06-02 NOTE — ED Notes (Addendum)
Artavis Cowie (wife) [phone] would to be contacted with updates regarding her husband. MD notified

## 2019-06-02 NOTE — ED Notes (Signed)
Date and time results received: 06/02/19 5:13 PM    Test: potassium Critical Value: 2.3  Name of Provider Notified: Vanita Panda MD  Orders Received? Or Actions Taken?: acknowledges result

## 2019-06-02 NOTE — Progress Notes (Signed)
Patient is essentially nonverbal with impaired cognition. He does attempt to speak and will nod his head in agreement or to say "no" when asked a question. He says he does not wear a CPAP at night but is not opposed to trying it. However, due to his persistent nausea, CPAP will be held for tonight. RN made aware.

## 2019-06-02 NOTE — ED Triage Notes (Addendum)
Patient BIB EMS from [facility] with complaints of abdominal pain x3 days. Staff reports patient had fever on Saturday, but no fever since. Staff and patient deny diarrhea/vomiting. Patient endorses nausea x3 days. Staff reports patient has no decrease in PO intake.   EMS placed 20G PIV to left hand.   EMS VS: 130/60, 90HR, 95% RA, 133=CBG, 98.44F

## 2019-06-02 NOTE — ED Notes (Signed)
ED TO INPATIENT HANDOFF REPORT  Name/Age/Gender Jared Hall 85 y.o. male  Code Status    Code Status Orders  (From admission, onward)         Start     Ordered   06/02/19 2142  Do not attempt resuscitation (DNR)  Continuous    Question Answer Comment  In the event of cardiac or respiratory ARREST Do not call a "code blue"   In the event of cardiac or respiratory ARREST Do not perform Intubation, CPR, defibrillation or ACLS   In the event of cardiac or respiratory ARREST Use medication by any route, position, wound care, and other measures to relive pain and suffering. May use oxygen, suction and manual treatment of airway obstruction as needed for comfort.      06/02/19 2144        Code Status History    Date Active Date Inactive Code Status Order ID Comments User Context   05/22/2018 1654 05/25/2018 0058 DNR [id number]  Janora Norlander, MD ED   05/22/2018 1654 05/22/2018 1654 DNR [id number]  Janora Norlander, MD ED   10/17/2017 2057 10/20/2017 1723 DNR [id number]  Elodia Florence., MD Inpatient   01/02/2017 1431 01/10/2017 0010 DNR [id number]  Germain Osgood, PA-C Inpatient   01/01/2017 2054 01/02/2017 1431 Full Code [id number]  Eugenie Filler, MD Inpatient   12/05/2016 2229 12/15/2016 1633 Full Code [id number]  Norval Morton, MD ED   03/14/2015 2349 03/17/2015 1946 Full Code [id number]  Jani Gravel, MD Inpatient   01/11/2014 1449 01/13/2014 1619 DNR [id number]  Annita Brod, MD Inpatient   04/04/2013 1721 04/08/2013 1846 Full Code [id number]  Orson Eva, MD Inpatient   12/18/2011 1341 12/22/2011 1429 Full Code [id number]  Courtney Heys, RN Inpatient   12/14/2011 0141 12/18/2011 1341 Full Code [id number]  Malva Limes, RN Inpatient   Advance Care Planning Activity      Home/SNF/Other Nursing Home  Chief Complaint Abd. Pain  Level of Care/Admitting Diagnosis ED Disposition    ED Disposition Condition Comment   Admit  Hospital Area: [facility] [[id number]]  Level of  Care: Telemetry [5]  Admit to tele based on following criteria: Monitor for Ischemic changes  Admit to tele based on following criteria: Other see comments  Comments: severe hypokalemia, monitor for arrythmia  Covid Evaluation: Asymptomatic Screening Protocol (No Symptoms)  Diagnosis: Hypokalemia [470962]  Admitting Physician: Jani Gravel [[id number]]  Attending Physician: Jani Gravel [[id number]]  PT Class (Do Not Modify): Observation [104]  PT Acc Code (Do Not Modify): Observation [10022]       Medical History Past Medical History:  Diagnosis Date  . Alpha thalassemia ([place])   . Anemia 01/01/2017  . BPH (benign prostatic hyperplasia)   . CHF (congestive heart failure) ([place])   . High cholesterol   . Hypertension   . Lower extremity edema   . Onychomycosis   . Prostate cancer ([place])    S/P "8 weeks of radiation"  . Prostatitis    recurrent  . Sleep apnea   . Stroke [facility]) ~ 2005   denies residual on 2/123/2015  . Type II diabetes mellitus ([place])   . Umbilical hernia   . Urinary urgency    with incontinence    Allergies Allergies  Allergen Reactions  . Ativan [Lorazepam] Other (See Comments)    Flailing of extremities noted immediately s/p IV administration.  [name] Kitchen Flomax [Tamsulosin Hcl] Other (See Comments)    Excessive tearing  .  Glucophage [Metformin Hcl] Diarrhea    IV Location/Drains/Wounds Patient Lines/Drains/Airways Status   Active Line/Drains/Airways    Name:   Placement date:   Placement time:   Site:   Days:   Peripheral IV 06/02/19 Left Hand   06/02/19    -    Hand   less than 1   Peripheral IV 06/02/19 Left Antecubital   06/02/19    1716    Antecubital   less than 1   Wound / Incision (Open or Dehisced) 05/22/18 Buttocks old pressure sore, scab on it   05/22/18    1854    Buttocks   376          Labs/Imaging Results for orders placed or performed during the hospital encounter of 06/02/19 (from the past 48 hour(s))  Lipase, blood     Status: None   Collection Time:  06/02/19  4:08 PM  Result Value Ref Range   Lipase 29 11 - 51 U/L    Comment: Performed at [facility], [place] [address] [place], [place] [address]  Comprehensive metabolic panel     Status: Abnormal   Collection Time: 06/02/19  4:08 PM  Result Value Ref Range   Sodium 140 135 - 145 mmol/L   Potassium 2.3 (LL) 3.5 - 5.1 mmol/L    Comment: CRITICAL RESULT CALLED TO, READ BACK BY AND VERIFIED WITH: Jamal Maes [id number] @ 1713 BY J SCOTTON    Chloride 91 (L) 98 - 111 mmol/L   CO2 33 (H) 22 - 32 mmol/L   Glucose, Bld 123 (H) 70 - 99 mg/dL   BUN 29 (H) 8 - 23 mg/dL   Creatinine, Ser 1.88 (H) 0.61 - 1.24 mg/dL   Calcium 9.2 8.9 - 10.3 mg/dL   Total Protein 7.3 6.5 - 8.1 g/dL   Albumin 3.9 3.5 - 5.0 g/dL   AST 25 15 - 41 U/L   ALT 19 0 - 44 U/L   Alkaline Phosphatase 74 38 - 126 U/L   Total Bilirubin 0.8 0.3 - 1.2 mg/dL   GFR calc non Af Amer 32 (L) >60 mL/min   GFR calc Af Amer 37 (L) >60 mL/min   Anion gap 16 (H) 5 - 15    Comment: Performed at [facility], [place] [address], [place], [place] [address]  CBC     Status: Abnormal   Collection Time: 06/02/19  4:08 PM  Result Value Ref Range   WBC 10.2 4.0 - 10.5 K/uL   RBC 5.21 4.22 - 5.81 MIL/uL   Hemoglobin 11.2 (L) 13.0 - 17.0 g/dL   HCT 37.0 (L) 39.0 - 52.0 %   MCV 71.0 (L) 80.0 - 100.0 fL   MCH 21.5 (L) 26.0 - 34.0 pg   MCHC 30.3 30.0 - 36.0 g/dL   RDW 15.4 11.5 - 15.5 %   Platelets 267 150 - 400 K/uL   nRBC 0.0 0.0 - 0.2 %    Comment: Performed at [facility], [place] [address] [place], [place] [address]   Ct Abdomen Pelvis Wo Contrast  Result Date: 06/02/2019 CLINICAL DATA:  Abdominal pain for several days EXAM: CT ABDOMEN AND PELVIS WITHOUT CONTRAST TECHNIQUE: Multidetector CT imaging of the abdomen and pelvis was performed following the standard protocol without IV contrast. COMPARISON:  04/04/2013 FINDINGS: Lower chest: Lung bases demonstrate mild scarring in the  left base. Mild right basilar atelectasis is seen without acute effusion. Liver is within normal limits. The gallbladder is  well distended with some gallstones within. No obstructive changes are seen. Hepatobiliary: No focal liver abnormality is seen. No gallstones, gallbladder wall thickening, or biliary dilatation. Pancreas: Unremarkable. No pancreatic ductal dilatation or surrounding inflammatory changes. Spleen: Normal in size without focal abnormality. Adrenals/Urinary Tract: Adrenal glands are within normal limits. Kidneys are well visualized bilaterally with tiny nonobstructing renal stone in the upper pole of the right kidney. Large exophytic cyst from the right kidney is seen. It measures approximately 6.1 cm in greatest dimension. This has enlarged in the interval from the prior study. No obstructive changes are seen. The ureters are well visualized to the bladder. The bladder is decompressed. Stomach/Bowel: The appendix is within normal limits. No obstructive or inflammatory changes of the larger small-bowel are seen. The stomach is decompressed. Vascular/Lymphatic: Aortic atherosclerosis. No enlarged abdominal or pelvic lymph nodes. Reproductive: Prostate is unremarkable. Other: No abdominal wall hernia or abnormality. No abdominopelvic ascites. Musculoskeletal: Degenerative changes of lumbar spine are noted. IMPRESSION: Mild right basilar atelectasis. Tiny nonobstructing right renal stone. Right renal cysts larger than that seen on the prior exam. No other acute abnormality is seen. Electronically Signed   By: Inez Catalina M.D.   On: 06/02/2019 17:59   Dg Chest Port 1 View  Result Date: 06/02/2019 CLINICAL DATA:  Fever EXAM: PORTABLE CHEST 1 VIEW COMPARISON:  05/27/2018 FINDINGS: Bibasilar atelectasis left greater than right is unchanged. Negative for heart failure. No definite pleural effusion. Small left effusion not excluded. Atherosclerotic aortic arch. IMPRESSION: Bibasilar  atelectasis/infiltrate left greater than right unchanged from 05/27/2018. No new findings. Electronically Signed   By: Franchot Gallo M.D.   On: 06/02/2019 21:13    Pending Labs Unresulted Labs (From admission, onward)    Start     Ordered   06/09/19 0500  Creatinine, serum  (enoxaparin (LOVENOX)    CrCl >/= 30 ml/min)  Weekly,   R    Comments: while on enoxaparin therapy    06/02/19 2144   06/03/19 0500  Comprehensive metabolic panel  Tomorrow morning,   R     06/02/19 2144   06/03/19 0500  CBC  Tomorrow morning,   R     06/02/19 2144   06/02/19 2145  Magnesium  Add-on,   AD     06/02/19 2144   06/02/19 2017  SARS Coronavirus 2 (CEPHEID - Performed in [place] hospital lab), Hosp Order  (Asymptomatic Patients Labs)  Once,   STAT    Question:  Rule Out  Answer:  Yes   06/02/19 2016   06/02/19 1608  Urinalysis, Routine w reflex microscopic  ONCE - STAT,   STAT     06/02/19 1607          Vitals/Pain Today's Vitals   06/02/19 1800 06/02/19 1802 06/02/19 1900 06/02/19 2030  BP: 137/66 137/66 (!) 149/74 (!) 141/67  Pulse: 97 98 96 96  Resp:  18  18  Temp:      TempSrc:      SpO2: 91% 90% 100% 100%    Isolation Precautions No active isolations  Medications Medications  0.9 %  sodium chloride infusion ( Intravenous New Bag/Given 06/02/19 1716)  potassium chloride 10 mEq in 100 mL IVPB (10 mEq Intravenous New Bag/Given 06/02/19 2106)  insulin aspart (novoLOG) injection 0-9 Units (has no administration in time range)  insulin aspart (novoLOG) injection 0-5 Units (has no administration in time range)  enoxaparin (LOVENOX) injection 40 mg (has no administration in time range)  sodium chloride flush (  NS) 0.9 % injection 3 mL (has no administration in time range)  sodium chloride flush (NS) 0.9 % injection 3 mL (has no administration in time range)  0.9 %  sodium chloride infusion (has no administration in time range)  acetaminophen (TYLENOL) tablet 650 mg (has no administration  in time range)    Or  acetaminophen (TYLENOL) suppository 650 mg (has no administration in time range)  ondansetron (ZOFRAN) injection 4 mg (4 mg Intravenous Given 06/02/19 1715)  0.9 % NaCl with KCl 20 mEq/ L  infusion ( Intravenous New Bag/Given 06/02/19 1819)    Mobility Wheelchair

## 2019-06-02 NOTE — ED Notes (Signed)
Pt aware that urine sample is needed.  

## 2019-06-02 NOTE — ED Provider Notes (Addendum)
[facility] DEPT Provider Note   CSN: [id number] Arrival date & time: 06/02/19  1543    History   Chief Complaint Chief Complaint  Patient presents with  . Abdominal Cramping    HPI Jared Hall is a 85 y.o. male.     HPI Patient with a history of stroke, multiple other medical problems presents from her nursing facility with staff concern of abdominal pain, nausea. The patient himself is largely nonverbal, has very poor hearing, reads lips, and understand some commands when directly verbally given to his ear, loudly. Seems as though over the past 3 days or so the patient has complained of abdominal pain, diffusely, with fever 3 days ago, but not today. The pain however, has been persistent, and is seemingly diffuse. No reported vomiting, nor diarrhea. It is unclear if he has received any medication for pain relief. Level 5 caveat secondary to the patient's essentially nonverbal status, but some details are provided seemingly appropriately, as above.  Past Medical History:  Diagnosis Date  . Alpha thalassemia ([place])   . Anemia 01/01/2017  . BPH (benign prostatic hyperplasia)   . CHF (congestive heart failure) ([place])   . High cholesterol   . Hypertension   . Lower extremity edema   . Onychomycosis   . Prostate cancer ([place])    S/P "8 weeks of radiation"  . Prostatitis    recurrent  . Sleep apnea   . Stroke [facility]) ~ 2005   denies residual on 2/123/2015  . Type II diabetes mellitus ([place])   . Umbilical hernia   . Urinary urgency    with incontinence    Patient Active Problem List   Diagnosis Date Noted  . TIA (transient ischemic attack) 05/22/2018  . Cellulitis in diabetic foot ([place]) 05/22/2018  . Tremor 10/17/2017  . Acute encephalopathy   . Acute ischemic stroke ([place])   . Generalized weakness   . Cerebrovascular disease   . Altered mental status   . Acute respiratory failure with hypercapnia ([place])   . CVA (cerebral vascular  accident) ([place]) 01/01/2017  . Dehydration 01/01/2017  . Anemia 01/01/2017  . Acute respiratory failure ([place]) 12/15/2016  . Morbid obesity ([place]) 12/15/2016  . Hard of hearing 12/15/2016  . Chronic kidney disease, stage III (moderate) ([place]) 12/06/2016  . Bell's palsy   . Facial droop   . Paroxysmal atrial fibrillation (H[facility])   . Embolic stroke ([place])   . Dysphagia 03/14/2015  . Dysarthria 03/14/2015  . Stroke ([place]) 03/14/2015  . Cellulitis 01/11/2014  . Bilateral lower extremity edema 01/11/2014  . Venous stasis dermatitis 01/11/2014  . DM type 2 with diabetic peripheral neuropathy ([place]) 01/11/2014  . Cellulitis and abscess of leg 01/11/2014  . Cellulitis and abscess 01/11/2014  . Depression 06/15/2013  . GERD (gastroesophageal reflux disease) 06/15/2013  . Constipation 06/15/2013  . Diabetic neuropathy ([place]) 06/15/2013  . Type 1 diabetes mellitus with peripheral circulatory complications ([place]) 31/51/7616  . Essential hypertension, benign 04/07/2013  . AKI (acute kidney injury) ([place]) 04/04/2013  . Bilateral lower leg cellulitis 04/04/2013  . Right hip pain 04/04/2013  . Renal insufficiency 12/16/2011  . History of CVA (cerebrovascular accident) 12/16/2011  . Dyslipidemia 12/16/2011  . Obesity (BMI 30-39.9) 12/16/2011  . Umbilical hernia with obstruction-partial on CT scan; easily reducible 12/14/2011  . History of PSVT (paroxysmal supraventricular tachycardia) 12/14/2011  . DM (diabetes mellitus) ([place]) 12/14/2011    Past Surgical History:  Procedure Laterality Date  . ADENOIDECTOMY    .  HERNIA REPAIR    . MASTOIDECTOMY    . TONSILLECTOMY    . VASECTOMY    . VENTRAL HERNIA REPAIR  12/18/2011   Procedure: HERNIA REPAIR VENTRAL ADULT;  Surgeon: Adin Hector, MD;  Location: [facility];  Service: General;  Laterality: N/A;        Home Medications    Prior to Admission medications   Medication Sig Start Date End Date Taking? Authorizing Provider  acetaminophen (TYLENOL)  500 MG tablet Take 500 mg by mouth every 6 (six) hours as needed for fever.   Yes [provider]  amitriptyline (ELAVIL) 10 MG tablet Take 10 mg by mouth at bedtime.   Yes [provider]  aspirin EC 81 MG tablet Take 81 mg by mouth daily.   Yes [provider]  Benzocaine-Menthol (CEPACOL) 15-2.3 MG LOZG Use as directed 1 lozenge in the mouth or throat 4 (four) times daily as needed (cough).   Yes [provider]  clopidogrel (PLAVIX) 75 MG tablet Take 75 mg by mouth daily.    Yes [provider]  cyclobenzaprine (FLEXERIL) 5 MG tablet Take 5 mg by mouth 2 (two) times daily as needed for muscle spasms.   Yes [provider]  furosemide (LASIX) 80 MG tablet Take 80 mg by mouth 2 (two) times daily.    Yes [provider]  insulin aspart (NOVOLOG FLEXPEN) 100 UNIT/ML FlexPen Inject 5 Units into the skin 3 (three) times daily with meals.   Yes [provider]  insulin aspart (NOVOLOG) 100 UNIT/ML injection Inject 7-18 Units into the skin 2 (two) times daily. Blood sugar < 150 = 0 units 151-200 = 7 units 201-250 = 9 units 251-300 = 11 units 301-350 = 13 units 351-400 = 15 units >400 = 18 units   Yes [provider]  insulin glargine (LANTUS) 100 UNIT/ML injection Inject 0.5 mLs (50 Units total) into the skin 2 (two) times daily. Patient taking differently: Inject 70 Units into the skin 2 (two) times daily.  10/20/17  Yes Rosita Fire, MD  metoprolol tartrate (LOPRESSOR) 25 MG tablet Take 1 tablet (25 mg total) by mouth 2 (two) times daily. 03/17/15  Yes Kelvin Cellar, MD  pantoprazole (PROTONIX) 40 MG tablet Take 40 mg by mouth daily.    Yes [provider]  Polyethyl Glycol-Propyl Glycol (SYSTANE) 0.4-0.3 % GEL ophthalmic gel Place 1 application into both eyes 3 (three) times daily as needed (dry eyes).   Yes [provider]  potassium chloride SA (K-DUR,KLOR-CON) 20 MEQ tablet Take 20-40  mEq by mouth as directed. Take 1 tablet on (Sun, Mon, Wed, Thurs & Sat.) & Take 2 tablets on (Tues & Fri) if Metolazone is taken.   Yes [provider]  senna-docusate (SENOKOT-S) 8.6-50 MG tablet Take 1 tablet by mouth 2 (two) times daily.   Yes [provider]  simvastatin (ZOCOR) 20 MG tablet Take 20 mg by mouth daily.   Yes [provider]  terazosin (HYTRIN) 2 MG capsule Take 4 mg by mouth daily.    Yes [provider]  traMADol (ULTRAM) 50 MG tablet Take 1 tablet (50 mg total) by mouth every 6 (six) hours as needed for moderate pain. 05/24/18  Yes Ghimire, Henreitta Leber, MD  vitamin B-12 (CYANOCOBALAMIN) 1000 MCG tablet Take 1,000 mcg by mouth daily.   Yes [provider]  Vitamin D, Cholecalciferol, 50 MCG (2000 UT) CAPS Take 1 capsule by mouth daily.   Yes [provider]  amitriptyline (ELAVIL) 25 MG tablet Take 1 tablet (25 mg total) by mouth at bedtime. Patient not taking: Reported on 06/02/2019 10/20/17   Rosita Fire, MD  azelastine (OPTIVAR) 0.05 % ophthalmic solution Place 1 drop into both eyes 2 (two) times daily as needed (dry eyes).     [provider]  doxycycline (VIBRA-TABS) 100 MG tablet Take 1 tablet (100 mg total) by mouth 2 (two) times daily. Patient not taking: Reported on 06/02/2019 05/24/18   Jonetta Osgood, MD  fluticasone [facility]) 50 MCG/ACT nasal spray Place 1 spray into both nostrils daily. Patient not taking: Reported on 06/02/2019 03/17/15   Kelvin Cellar, MD  gabapentin (NEURONTIN) 100 MG capsule Take 2 capsules (200 mg total) by mouth 2 (two) times daily. Hold for sedation. Patient not taking: Reported on 06/02/2019 05/24/18   Jonetta Osgood, MD  metolazone (ZAROXOLYN) 5 MG tablet Take 5 mg by mouth as directed. Take 1 tablet every Tues & Fri. Hold If No Leg Edema 05/20/19   [provider]    Family History Family History  Problem Relation Age of Onset  . Pneumonia Mother   . Heart  attack Father     Social History Social History   Tobacco Use  . Smoking status: Never Smoker  . Smokeless tobacco: Never Used  Substance Use Topics  . Alcohol use: No  . Drug use: No     Allergies   Ativan [lorazepam], Flomax [tamsulosin hcl], and Glucophage [metformin hcl]   Review of Systems Review of Systems  Unable to perform ROS: Patient nonverbal     Physical Exam Updated Vital Signs BP (!) 149/74   Pulse 96   Temp 98.6 F (37 C) (Oral)   Resp 18   SpO2 100%   Physical Exam Vitals signs and nursing note reviewed.  Constitutional:      General: He is not in acute distress.    Appearance: He is well-developed. He is obese. He is ill-appearing.  HENT:     Head: Normocephalic and atraumatic.  Eyes:     Conjunctiva/sclera: Conjunctivae normal.  Cardiovascular:     Rate and Rhythm: Regular rhythm. Tachycardia present.  Pulmonary:     Effort: Pulmonary effort is normal. No respiratory distress.     Breath sounds: No stridor.  Abdominal:     General: There is no distension.     Tenderness: There is abdominal tenderness. There is guarding.     Comments: Protuberant abdomen, not distended  Skin:    General: Skin is warm and dry.     Comments: Mild skin lesion, left inguinal crease  Neurological:     Mental Status: He is alert.     Motor: Weakness and atrophy present.     Comments: Patient is essentially nonverbal, though he does attempt to speak somewhat, inconsistently. He is generally weak, with diffuse atrophy, though he does move extremities spontaneously.  Psychiatric:        Cognition and Memory: Cognition is impaired.      ED Treatments / Results  Labs (all labs ordered are listed, but only abnormal results are displayed) Labs Reviewed  COMPREHENSIVE METABOLIC PANEL - Abnormal; Notable for the following components:      Result Value   Potassium 2.3 (*)    Chloride 91 (*)    CO2 33 (*)    Glucose, Bld 123 (*)    BUN 29 (*)    Creatinine,  Ser 1.88 (*)    GFR  calc non Af Amer 32 (*)    GFR calc Af Amer 37 (*)    Anion gap 16 (*)    All other components within normal limits  CBC - Abnormal; Notable for the following components:   Hemoglobin 11.2 (*)    HCT 37.0 (*)    MCV 71.0 (*)    MCH 21.5 (*)    All other components within normal limits  LIPASE, BLOOD  URINALYSIS, ROUTINE W REFLEX MICROSCOPIC    EKG EKG Interpretation  Date/Time:  Monday June 02 2019 20:04:28 EDT Ventricular Rate:  104 PR Interval:    QRS Duration: 110 QT Interval:  397 QTC Calculation: 515 R Axis:   57 Text Interpretation:  Sinus tachycardia Multiform ventricular premature complexes Inferior infarct, old substantial arefact but no sig changes from prior Abnormal ECG Confirmed by Carmin Muskrat [phone]) on 06/02/2019 8:14:08 PM   Radiology Ct Abdomen Pelvis Wo Contrast  Result Date: 06/02/2019 CLINICAL DATA:  Abdominal pain for several days EXAM: CT ABDOMEN AND PELVIS WITHOUT CONTRAST TECHNIQUE: Multidetector CT imaging of the abdomen and pelvis was performed following the standard protocol without IV contrast. COMPARISON:  04/04/2013 FINDINGS: Lower chest: Lung bases demonstrate mild scarring in the left base. Mild right basilar atelectasis is seen without acute effusion. Liver is within normal limits. The gallbladder is well distended with some gallstones within. No obstructive changes are seen. Hepatobiliary: No focal liver abnormality is seen. No gallstones, gallbladder wall thickening, or biliary dilatation. Pancreas: Unremarkable. No pancreatic ductal dilatation or surrounding inflammatory changes. Spleen: Normal in size without focal abnormality. Adrenals/Urinary Tract: Adrenal glands are within normal limits. Kidneys are well visualized bilaterally with tiny nonobstructing renal stone in the upper pole of the right kidney. Large exophytic cyst from the right kidney is seen. It measures approximately 6.1 cm in greatest dimension. This has  enlarged in the interval from the prior study. No obstructive changes are seen. The ureters are well visualized to the bladder. The bladder is decompressed. Stomach/Bowel: The appendix is within normal limits. No obstructive or inflammatory changes of the larger small-bowel are seen. The stomach is decompressed. Vascular/Lymphatic: Aortic atherosclerosis. No enlarged abdominal or pelvic lymph nodes. Reproductive: Prostate is unremarkable. Other: No abdominal wall hernia or abnormality. No abdominopelvic ascites. Musculoskeletal: Degenerative changes of lumbar spine are noted. IMPRESSION: Mild right basilar atelectasis. Tiny nonobstructing right renal stone. Right renal cysts larger than that seen on the prior exam. No other acute abnormality is seen. Electronically Signed   By: Inez Catalina M.D.   On: 06/02/2019 17:59   Dg Chest Port 1 View  Result Date: 06/02/2019 CLINICAL DATA:  Fever EXAM: PORTABLE CHEST 1 VIEW COMPARISON:  05/27/2018 FINDINGS: Bibasilar atelectasis left greater than right is unchanged. Negative for heart failure. No definite pleural effusion. Small left effusion not excluded. Atherosclerotic aortic arch. IMPRESSION: Bibasilar atelectasis/infiltrate left greater than right unchanged from 05/27/2018. No new findings. Electronically Signed   By: Franchot Gallo M.D.   On: 06/02/2019 21:13    Procedures Procedures (including critical care time)  Medications Ordered in ED Medications  0.9 %  sodium chloride infusion ( Intravenous New Bag/Given 06/02/19 1716)  0.9 % NaCl with KCl 20 mEq/ L  infusion ( Intravenous New Bag/Given 06/02/19 1819)  potassium chloride 10 mEq in 100 mL IVPB (10 mEq Intravenous New Bag/Given 06/02/19 2003)  ondansetron (ZOFRAN) injection 4 mg (4 mg Intravenous Given 06/02/19 1715)     Initial Impression / Assessment and Plan / ED Course  I  have reviewed the triage vital signs and the nursing notes.  Pertinent labs & imaging results that were available during  my care of the patient were reviewed by me and considered in my medical decision making (see chart for details).        8:12 PM On repeat exam the patient is in similar condition, answer questions inappropriately. CT generally reassuring aside from punctate stone. However, the patient is found to have critically abnormal potassium value of 2.3. Patient received initial fluids, and fluids with potassium bolus over the first 2 hours of fluid resuscitation, now was preparing for 6 hours of 10 mEq potassium per hour repletion. EKG unremarkable. I discussed patient's case with his wife, to corroborate history. She notes that the patient has been dealing with crampy abdominal sensation recently, has a history of abnormal potassium values in the past, and is unsure if he has taken supplements recently at his nursing facility. She notes that he is seemingly interacting in a typical manner, when I relate your how we are interacting, reiterating the patient has extremely poor hearing, and difficulty with communicating at baseline. This elderly male presents with reported cramping sensation, abdominal discomfort, fever at his nursing facility.  On here he is afebrile, but is uncomfortable in appearance. Is a non-peritoneal abdomen, though with some tenderness, CT scan was performed. This was largely reassuring.  On however, the patient is found have worsening renal function, hypokalemia. EKG not notably different from baseline, but given his substantial abnormality, patient required multiple boluses of fluid repletion, potassium, and he was persistently nauseous so oral repletion was not an option.    Final Clinical Impressions(s) / ED Diagnoses   Final diagnoses:  Abdominal cramping  Hypokalemia     Carmin Muskrat, MD 06/02/19 2014  CRITICAL CARE Performed by: Carmin Muskrat Total critical care time: 35 minutes Critical care time was exclusive of separately billable procedures and  treating other patients. Critical care was necessary to treat or prevent imminent or life-threatening deterioration. Critical care was time spent personally by me on the following activities: development of treatment plan with patient and/or surrogate as well as nursing, discussions with consultants, evaluation of patient's response to treatment, examination of patient, obtaining history from patient or surrogate, ordering and performing treatments and interventions, ordering and review of laboratory studies, ordering and review of radiographic studies, pulse oximetry and re-evaluation of patient's condition.     Carmin Muskrat, MD 06/02/19 2021    Carmin Muskrat, MD 06/02/19 2120

## 2019-06-02 NOTE — H&P (Addendum)
TRH H&P    Patient Demographics:    Jared Hall, is a 85 y.o. male  MRN: [id number]  DOB - [birth year]  Admit Date - 06/02/2019  Referring MD/NP/PA:  Carmin Muskrat  Outpatient Primary MD for the patient is Josetta Huddle, MD  Patient coming from:  [facility]  Chief complaint-  Severe hypokalemia   HPI:    Jared Hall  is a 85 y.o. male, w Anemia, Thallassemia, hypertension, hyperlipidemia, Dm2, h/o Stroke, Pafib, CHF (EF 55%),  OSA, Bph/ prostate cancer s/p XRT apparently present with c/o nausea as well as abdominal discomfort "cramping".  Pt denies fever, chills, cough, cp, palp, constipation, diarrhea, brbpr, black stool.     In ED,  T 98.6, P 94  R 18  Bp 137/46  Pox 94%  CXR IMPRESSION: Bibasilar atelectasis/infiltrate left greater than right unchanged from 05/27/2018. No new findings.  CT abd/ pelvis IMPRESSION: Mild right basilar atelectasis. Tiny nonobstructing right renal stone. Right renal cysts larger than that seen on the prior exam. No other acute abnormality is seen.  covid 19 negative   Lipase 29 Na 140, k 2.3 Bun 29, Creatinine 1.88 Glucose 123,  Na 140, K 2.3 Hco3 33 Ast 25, Alt 19 Alk phos 74, T. Bili 0.8  Magnesium pending  Pt given Ns + 20 meq Kcl 575m iv x1, and started on ns at 1212mper hour, (stopped due to hx of CHF), and ordered Kcl 1083min 100m65m iv x6.   Pt will be admitted for severe hypokalemia likely secondary to metolazone and lasix.        Review of systems:    In addition to the HPI above,  No Fever-chills, No Headache, No changes with Vision or hearing, No problems swallowing food or Liquids, No Chest pain, Cough or Shortness of Breath, No Vomiting, bowel movements are regular, no diarrhea, no constipation No Blood in stool or Urine, No dysuria, No new skin rashes or bruises, No new joints pains-aches,  No new weakness, tingling,  numbness in any extremity, No recent weight gain or loss, No polyuria, polydypsia or polyphagia, No significant Mental Stressors.  All other systems reviewed and are negative.    Past History of the following :    Past Medical History:  Diagnosis Date   Alpha thalassemia (HCC)[place] Anemia 01/01/2017   BPH (benign prostatic hyperplasia)    CHF (congestive heart failure) (HCC)    High cholesterol    Hypertension    Lower extremity edema    Onychomycosis    Prostate cancer (HCC)[place] S/P "8 weeks of radiation"   Prostatitis    recurrent   Sleep apnea    [facility]   denies residual on 2/123/2015   Type II diabetes mellitus (HCC)[place] Umbilical hernia    Urinary urgency    with incontinence      Past Surgical History:  Procedure Laterality Date   ADENOIDECTOMY     HERNIA REPAIR     MASTOIDECTOMY  TONSILLECTOMY     VASECTOMY     VENTRAL HERNIA REPAIR  12/18/2011   Procedure: HERNIA REPAIR VENTRAL ADULT;  Surgeon: Adin Hector, MD;  Location: [place];  Service: General;  Laterality: N/A;      Social History:      Social History   Tobacco Use   Smoking status: Never Smoker   Smokeless tobacco: Never Used  Substance Use Topics   Alcohol use: No       Family History :     Family History  Problem Relation Age of Onset   Pneumonia Mother    Heart attack Father        Home Medications:   Prior to Admission medications   Medication Sig Start Date End Date Taking? Authorizing Provider  acetaminophen (TYLENOL) 500 MG tablet Take 500 mg by mouth every 6 (six) hours as needed for fever.   Yes [provider]  amitriptyline (ELAVIL) 10 MG tablet Take 10 mg by mouth at bedtime.   Yes [provider]  aspirin EC 81 MG tablet Take 81 mg by mouth daily.   Yes [provider]  Benzocaine-Menthol (CEPACOL) 15-2.3 MG LOZG Use as directed 1 lozenge in the mouth or throat 4 (four) times daily as needed  (cough).   Yes [provider]  clopidogrel (PLAVIX) 75 MG tablet Take 75 mg by mouth daily.    Yes [provider]  cyclobenzaprine (FLEXERIL) 5 MG tablet Take 5 mg by mouth 2 (two) times daily as needed for muscle spasms.   Yes [provider]  furosemide (LASIX) 80 MG tablet Take 80 mg by mouth 2 (two) times daily.    Yes [provider]  insulin aspart (NOVOLOG FLEXPEN) 100 UNIT/ML FlexPen Inject 5 Units into the skin 3 (three) times daily with meals.   Yes [provider]  insulin aspart (NOVOLOG) 100 UNIT/ML injection Inject 7-18 Units into the skin 2 (two) times daily. Blood sugar < 150 = 0 units 151-200 = 7 units 201-250 = 9 units 251-300 = 11 units 301-350 = 13 units 351-400 = 15 units >400 = 18 units   Yes [provider]  insulin glargine (LANTUS) 100 UNIT/ML injection Inject 0.5 mLs (50 Units total) into the skin 2 (two) times daily. Patient taking differently: Inject 70 Units into the skin 2 (two) times daily.  10/20/17  Yes Rosita Fire, MD  metoprolol tartrate (LOPRESSOR) 25 MG tablet Take 1 tablet (25 mg total) by mouth 2 (two) times daily. 03/17/15  Yes Kelvin Cellar, MD  pantoprazole (PROTONIX) 40 MG tablet Take 40 mg by mouth daily.    Yes [provider]  Polyethyl Glycol-Propyl Glycol (SYSTANE) 0.4-0.3 % GEL ophthalmic gel Place 1 application into both eyes 3 (three) times daily as needed (dry eyes).   Yes [provider]  potassium chloride SA (K-DUR,KLOR-CON) 20 MEQ tablet Take 20-40 mEq by mouth as directed. Take 1 tablet on (Sun, Mon, Wed, Thurs & Sat.) & Take 2 tablets on (Tues & Fri) if Metolazone is taken.   Yes [provider]  senna-docusate (SENOKOT-S) 8.6-50 MG tablet Take 1 tablet by mouth 2 (two) times daily.   Yes [provider]  simvastatin (ZOCOR) 20 MG tablet Take 20 mg by mouth daily.   Yes [provider]  terazosin (HYTRIN) 2 MG capsule Take 4 mg  by mouth daily.    Yes [provider]  traMADol (ULTRAM) 50 MG tablet Take 1 tablet (50  mg total) by mouth every 6 (six) hours as needed for moderate pain. 05/24/18  Yes Ghimire, Henreitta Leber, MD  vitamin B-12 (CYANOCOBALAMIN) 1000 MCG tablet Take 1,000 mcg by mouth daily.   Yes [provider]  Vitamin D, Cholecalciferol, 50 MCG (2000 UT) CAPS Take 1 capsule by mouth daily.   Yes [provider]  amitriptyline (ELAVIL) 25 MG tablet Take 1 tablet (25 mg total) by mouth at bedtime. Patient not taking: Reported on 06/02/2019 10/20/17   Rosita Fire, MD  azelastine (OPTIVAR) 0.05 % ophthalmic solution Place 1 drop into both eyes 2 (two) times daily as needed (dry eyes).     [provider]  doxycycline (VIBRA-TABS) 100 MG tablet Take 1 tablet (100 mg total) by mouth 2 (two) times daily. Patient not taking: Reported on 06/02/2019 05/24/18   Jonetta Osgood, MD  fluticasone [facility]) 50 MCG/ACT nasal spray Place 1 spray into both nostrils daily. Patient not taking: Reported on 06/02/2019 03/17/15   Kelvin Cellar, MD  gabapentin (NEURONTIN) 100 MG capsule Take 2 capsules (200 mg total) by mouth 2 (two) times daily. Hold for sedation. Patient not taking: Reported on 06/02/2019 05/24/18   Jonetta Osgood, MD  metolazone (ZAROXOLYN) 5 MG tablet Take 5 mg by mouth as directed. Take 1 tablet every Tues & Fri. Hold If No Leg Edema 05/20/19   [provider]     Allergies:     Allergies  Allergen Reactions   Ativan [Lorazepam] Other (See Comments)    Flailing of extremities noted immediately s/p IV administration.   Flomax [Tamsulosin Hcl] Other (See Comments)    Excessive tearing   Glucophage [Metformin Hcl] Diarrhea     Physical Exam:   Vitals  Blood pressure (!) 141/67, pulse 96, temperature 98.6 F (37 C), temperature source Oral, resp. rate 18, SpO2 100 %.  1.  General: axoxo2 (person, place), pt states year 2019, but knows that just had  July 4  2. Psychiatric: euthymic  3. Neurologic: cn2-12 intact, reflexes 2+ symmetric, diffuse with no clonus, motor 5/5 in all 4 ext   4. HEENMT:  Anicteric, pupils 1.37m symmetric, direct,consensual, near intact Neck: no jvd, no bruit  5. Respiratory : CTAB  6. Cardiovascular : rrr s1, s2, 1/6 sem rusb  7. Gastrointestinal:  Abd: soft, nt, nd, +bs  8. Skin:  Ext: no c/c/e,  No rash  9.Musculoskeletal:  Good ROM  No adenopathy    Data Review:    CBC Recent Labs  Lab 06/02/19 1608  WBC 10.2  HGB 11.2*  HCT 37.0*  PLT 267  MCV 71.0*  MCH 21.5*  MCHC 30.3  RDW 15.4   ------------------------------------------------------------------------------------------------------------------  Results for orders placed or performed during the hospital encounter of 06/02/19 (from the past 48 hour(s))  Lipase, blood     Status: None   Collection Time: 06/02/19  4:08 PM  Result Value Ref Range   Lipase 29 11 - 51 U/L    Comment: [facility] [address] [place] [id number] Comprehensive metabolic panel     Status: Abnormal   Collection Time: 06/02/19  4:08 PM  Result Value Ref Range   Sodium 140 135 - 145 mmol/L   Potassium 2.3 (LL) 3.5 - 5.1 mmol/L    Comment: CRITICAL RESULT CALLED TO, READ BACK BY AND VERIFIED WITH: MJamal [id number]@ 1713 BY J SCOTTON    Chloride 91 (L) 98 - 111 mmol/L  CO2 33 (H) 22 - 32 mmol/L   Glucose, Bld 123 (H) 70 - 99 mg/dL   BUN 29 (H) 8 - 23 mg/dL   Creatinine, Ser 1.88 (H) 0.61 - 1.24 mg/dL   Calcium 9.2 8.9 - 10.3 mg/dL   Total Protein 7.3 6.5 - 8.1 g/dL   Albumin 3.9 3.5 - 5.0 g/dL   AST 25 15 - 41 U/L   ALT 19 0 - 44 U/L   Alkaline Phosphatase 74 38 - 126 U/L   Total Bilirubin 0.8 0.3 - 1.2 mg/dL   GFR calc non Af Amer 32 (L) >60 mL/min   GFR calc Af Amer 37 (L) >60 mL/min   Anion gap 16 (H) 5 - 15    Comment: Performed at [facility], [place] [address] [place], [place] [address]  CBC     Status: Abnormal   Collection Time: 06/02/19  4:08 PM  Result Value Ref Range   WBC 10.2 4.0 - 10.5 K/uL   RBC 5.21 4.22 - 5.81 MIL/uL   Hemoglobin 11.2 (L) 13.0 - 17.0 g/dL   HCT 37.0 (L) 39.0 - 52.0 %   MCV 71.0 (L) 80.0 - 100.0 fL   MCH 21.5 (L) 26.0 - 34.0 pg   MCHC 30.3 30.0 - 36.0 g/dL   RDW 15.4 11.5 - 15.5 %   Platelets 267 150 - 400 K/uL   nRBC 0.0 0.0 - 0.2 %    Comment: Performed at [facility], [address] [place], [place] [address]    Chemistries  Recent Labs  Lab 06/02/19 1608  NA 140  K 2.3*  CL 91*  CO2 33*  GLUCOSE 123*  BUN 29*  CREATININE 1.88*  CALCIUM 9.2  AST 25  ALT 19  ALKPHOS 74  BILITOT 0.8   ------------------------------------------------------------------------------------------------------------------  ------------------------------------------------------------------------------------------------------------------ GFR: CrCl cannot be calculated (Unknown ideal weight.). Liver Function Tests: Recent Labs  Lab 06/02/19 1608  AST 25  ALT 19  ALKPHOS 74  BILITOT 0.8  PROT 7.3  ALBUMIN 3.9   Recent Labs  Lab 06/02/19 1608  LIPASE 29   No results for input(s): AMMONIA in the last 168 hours. Coagulation Profile: No results for input(s): INR, PROTIME in the last 168 hours. Cardiac Enzymes: No results for input(s): CKTOTAL, CKMB, CKMBINDEX, TROPONINI in the last 168 hours. BNP (last 3 results) No results for input(s): PROBNP in the last 8760 hours. HbA1C: No results for input(s): HGBA1C in the last 72 hours. CBG: No results for input(s): GLUCAP in the last 168 hours. Lipid Profile: No results for input(s): CHOL, HDL, LDLCALC, TRIG, CHOLHDL, LDLDIRECT in the last 72 hours. Thyroid Function Tests: No results for input(s): TSH, T4TOTAL, FREET4, T3FREE, THYROIDAB in the last 72 hours. Anemia Panel: No results for input(s): VITAMINB12, FOLATE, FERRITIN, TIBC, IRON,  RETICCTPCT in the last 72 hours.  --------------------------------------------------------------------------------------------------------------- Urine analysis:    Component Value Date/Time   COLORURINE YELLOW 05/22/2018 1945   APPEARANCEUR HAZY (A) 05/22/2018 1945   LABSPEC 1.009 05/22/2018 1945   PHURINE 6.0 05/22/2018 1945   GLUCOSEU NEGATIVE 05/22/2018 1945   HGBUR MODERATE (A) 05/22/2018 1945   BILIRUBINUR NEGATIVE 05/22/2018 1945   KETONESUR NEGATIVE 05/22/2018 1945   PROTEINUR NEGATIVE 05/22/2018 1945   UROBILINOGEN 1.0 12/06/2014 1414   NITRITE NEGATIVE 05/22/2018 1945   LEUKOCYTESUR SMALL (A) 05/22/2018 1945      Imaging Results:    Ct Abdomen Pelvis Wo Contrast  Result Date: 06/02/2019 CLINICAL DATA:  Abdominal pain  for several days EXAM: CT ABDOMEN AND PELVIS WITHOUT CONTRAST TECHNIQUE: Multidetector CT imaging of the abdomen and pelvis was performed following the standard protocol without IV contrast. COMPARISON:  04/04/2013 FINDINGS: Lower chest: Lung bases demonstrate mild scarring in the left base. Mild right basilar atelectasis is seen without acute effusion. Liver is within normal limits. The gallbladder is well distended with some gallstones within. No obstructive changes are seen. Hepatobiliary: No focal liver abnormality is seen. No gallstones, gallbladder wall thickening, or biliary dilatation. Pancreas: Unremarkable. No pancreatic ductal dilatation or surrounding inflammatory changes. Spleen: Normal in size without focal abnormality. Adrenals/Urinary Tract: Adrenal glands are within normal limits. Kidneys are well visualized bilaterally with tiny nonobstructing renal stone in the upper pole of the right kidney. Large exophytic cyst from the right kidney is seen. It measures approximately 6.1 cm in greatest dimension. This has enlarged in the interval from the prior study. No obstructive changes are seen. The ureters are well visualized to the bladder. The bladder is  decompressed. Stomach/Bowel: The appendix is within normal limits. No obstructive or inflammatory changes of the larger small-bowel are seen. The stomach is decompressed. Vascular/Lymphatic: Aortic atherosclerosis. No enlarged abdominal or pelvic lymph nodes. Reproductive: Prostate is unremarkable. Other: No abdominal wall hernia or abnormality. No abdominopelvic ascites. Musculoskeletal: Degenerative changes of lumbar spine are noted. IMPRESSION: Mild right basilar atelectasis. Tiny nonobstructing right renal stone. Right renal cysts larger than that seen on the prior exam. No other acute abnormality is seen. Electronically Signed   By: Inez Catalina M.D.   On: 06/02/2019 17:59   Dg Chest Port 1 View  Result Date: 06/02/2019 CLINICAL DATA:  Fever EXAM: PORTABLE CHEST 1 VIEW COMPARISON:  05/27/2018 FINDINGS: Bibasilar atelectasis left greater than right is unchanged. Negative for heart failure. No definite pleural effusion. Small left effusion not excluded. Atherosclerotic aortic arch. IMPRESSION: Bibasilar atelectasis/infiltrate left greater than right unchanged from 05/27/2018. No new findings. Electronically Signed   By: Franchot Gallo M.D.   On: 06/02/2019 21:13   nsr at 100, nl axis, nl pr int, nl qt, q in 3, avf,  v1-3, u waves   Assessment & Plan:    Principal Problem:   Hypokalemia Active Problems:   DM (diabetes mellitus) ([place])   History of CVA (cerebrovascular accident)   Essential hypertension, benign   GERD (gastroesophageal reflux disease)   DM type 2 with diabetic peripheral neuropathy (HCC)   Paroxysmal atrial fibrillation (HCC)   Chronic kidney disease, stage III (moderate) (HCC)   Anemia  Hypokalemia likely secondary to diuretics (lasix, metolazone) Consider endocrinology referral as outpatient to r/o Conns syndrome, [place], or CAH  Check magnesium Cont repletetion Check cmp in am  Pafib , CHF (EF 55%) Cont Aspirin Cont Plavix Cont metoprolol 25m po bid Cont Lasix  831mpo bid Hold Metolazone due to hypokalemia  Dm2 w CKD stage3 Cont Lantus 50 units [place] qday Cont novlog 5 units [place] tid ac meals fsbs ac and qhs, ISS  Diabetic neuropathy, Chronic pain Cont Tramadol 5058mo q6h prn  Cont Flexeril 5mg58m bid prn  Hyperlipidemia, h/o CVA Cont Simvastatin 20mg7mqhs Cont aspirin, plavix as above  Gerd Cont PPI   BPH Cont Terazosin 4mg p48mhs  Constipation Cont Senokot-S 1 po qday  Anemia, Thallasemia Check cbc in am  Subjective fever per wife Urinalysis pending, please follow up   DVT Prophylaxis-   Lovenox - SCDs   AM Labs Ordered, also please review Full Orders  Family Communication:  Admission, patients condition and plan of care including tests being ordered have been discussed with the patient  who indicate understanding and agree with the plan and Code Status.  Code Status:  FULL CODE, d/w his family, wife that pt will be admitted to [facility] for hypokalemia.   Admission status: Observation: Based on patients clinical presentation and evaluation of above clinical data, I have made determination that patient meets observation criteria at this time.  Time spent in minutes : 70 minutes   Jani Gravel M.D on 06/02/2019 at 9:48 PM

## 2019-06-03 ENCOUNTER — Encounter (HOSPITAL_COMMUNITY): Payer: Self-pay | Admitting: *Deleted

## 2019-06-03 ENCOUNTER — Other Ambulatory Visit: Payer: Self-pay

## 2019-06-03 DIAGNOSIS — I130 Hypertensive heart and chronic kidney disease with heart failure and stage 1 through stage 4 chronic kidney disease, or unspecified chronic kidney disease: Secondary | ICD-10-CM | POA: Diagnosis present

## 2019-06-03 DIAGNOSIS — D649 Anemia, unspecified: Secondary | ICD-10-CM | POA: Diagnosis not present

## 2019-06-03 DIAGNOSIS — R933 Abnormal findings on diagnostic imaging of other parts of digestive tract: Secondary | ICD-10-CM | POA: Diagnosis not present

## 2019-06-03 DIAGNOSIS — I10 Essential (primary) hypertension: Secondary | ICD-10-CM | POA: Diagnosis not present

## 2019-06-03 DIAGNOSIS — R0902 Hypoxemia: Secondary | ICD-10-CM | POA: Diagnosis not present

## 2019-06-03 DIAGNOSIS — K219 Gastro-esophageal reflux disease without esophagitis: Secondary | ICD-10-CM | POA: Diagnosis not present

## 2019-06-03 DIAGNOSIS — N183 Chronic kidney disease, stage 3 (moderate): Secondary | ICD-10-CM | POA: Diagnosis present

## 2019-06-03 DIAGNOSIS — I480 Paroxysmal atrial fibrillation: Secondary | ICD-10-CM | POA: Diagnosis not present

## 2019-06-03 DIAGNOSIS — E1142 Type 2 diabetes mellitus with diabetic polyneuropathy: Secondary | ICD-10-CM | POA: Diagnosis not present

## 2019-06-03 DIAGNOSIS — I509 Heart failure, unspecified: Secondary | ICD-10-CM | POA: Diagnosis not present

## 2019-06-03 DIAGNOSIS — I119 Hypertensive heart disease without heart failure: Secondary | ICD-10-CM | POA: Diagnosis not present

## 2019-06-03 DIAGNOSIS — K311 Adult hypertrophic pyloric stenosis: Secondary | ICD-10-CM | POA: Diagnosis present

## 2019-06-03 DIAGNOSIS — G4733 Obstructive sleep apnea (adult) (pediatric): Secondary | ICD-10-CM | POA: Diagnosis present

## 2019-06-03 DIAGNOSIS — I5032 Chronic diastolic (congestive) heart failure: Secondary | ICD-10-CM | POA: Diagnosis present

## 2019-06-03 DIAGNOSIS — E11649 Type 2 diabetes mellitus with hypoglycemia without coma: Secondary | ICD-10-CM | POA: Diagnosis present

## 2019-06-03 DIAGNOSIS — E1122 Type 2 diabetes mellitus with diabetic chronic kidney disease: Secondary | ICD-10-CM | POA: Diagnosis present

## 2019-06-03 DIAGNOSIS — T502X5A Adverse effect of carbonic-anhydrase inhibitors, benzothiadiazides and other diuretics, initial encounter: Secondary | ICD-10-CM | POA: Diagnosis present

## 2019-06-03 DIAGNOSIS — E785 Hyperlipidemia, unspecified: Secondary | ICD-10-CM | POA: Diagnosis present

## 2019-06-03 DIAGNOSIS — N179 Acute kidney failure, unspecified: Secondary | ICD-10-CM | POA: Diagnosis present

## 2019-06-03 DIAGNOSIS — N400 Enlarged prostate without lower urinary tract symptoms: Secondary | ICD-10-CM | POA: Diagnosis present

## 2019-06-03 DIAGNOSIS — E876 Hypokalemia: Secondary | ICD-10-CM | POA: Diagnosis present

## 2019-06-03 DIAGNOSIS — E860 Dehydration: Secondary | ICD-10-CM | POA: Diagnosis present

## 2019-06-03 DIAGNOSIS — Z8546 Personal history of malignant neoplasm of prostate: Secondary | ICD-10-CM | POA: Diagnosis not present

## 2019-06-03 DIAGNOSIS — E7800 Pure hypercholesterolemia, unspecified: Secondary | ICD-10-CM | POA: Diagnosis present

## 2019-06-03 DIAGNOSIS — Z4682 Encounter for fitting and adjustment of non-vascular catheter: Secondary | ICD-10-CM | POA: Diagnosis not present

## 2019-06-03 DIAGNOSIS — R109 Unspecified abdominal pain: Secondary | ICD-10-CM | POA: Diagnosis not present

## 2019-06-03 DIAGNOSIS — R5381 Other malaise: Secondary | ICD-10-CM | POA: Diagnosis not present

## 2019-06-03 DIAGNOSIS — I959 Hypotension, unspecified: Secondary | ICD-10-CM | POA: Diagnosis not present

## 2019-06-03 DIAGNOSIS — R140 Abdominal distension (gaseous): Secondary | ICD-10-CM | POA: Diagnosis not present

## 2019-06-03 DIAGNOSIS — Z7401 Bed confinement status: Secondary | ICD-10-CM | POA: Diagnosis not present

## 2019-06-03 DIAGNOSIS — Z8673 Personal history of transient ischemic attack (TIA), and cerebral infarction without residual deficits: Secondary | ICD-10-CM | POA: Diagnosis not present

## 2019-06-03 DIAGNOSIS — K5900 Constipation, unspecified: Secondary | ICD-10-CM | POA: Diagnosis present

## 2019-06-03 DIAGNOSIS — Z1159 Encounter for screening for other viral diseases: Secondary | ICD-10-CM | POA: Diagnosis not present

## 2019-06-03 DIAGNOSIS — M2550 Pain in unspecified joint: Secondary | ICD-10-CM | POA: Diagnosis not present

## 2019-06-03 DIAGNOSIS — Z66 Do not resuscitate: Secondary | ICD-10-CM | POA: Diagnosis present

## 2019-06-03 DIAGNOSIS — G8929 Other chronic pain: Secondary | ICD-10-CM | POA: Diagnosis present

## 2019-06-03 LAB — GLUCOSE, CAPILLARY
Glucose-Capillary: 116 mg/dL — ABNORMAL HIGH (ref 70–99)
Glucose-Capillary: 117 mg/dL — ABNORMAL HIGH (ref 70–99)
Glucose-Capillary: 141 mg/dL — ABNORMAL HIGH (ref 70–99)
Glucose-Capillary: 113 mg/dL — ABNORMAL HIGH (ref 70–99)
Glucose-Capillary: 143 mg/dL — ABNORMAL HIGH (ref 70–99)

## 2019-06-03 LAB — CBC
HCT: 34.7 % — ABNORMAL LOW (ref 39.0–52.0)
Hemoglobin: 10.3 g/dL — ABNORMAL LOW (ref 13.0–17.0)
MCH: 21.4 pg — ABNORMAL LOW (ref 26.0–34.0)
MCHC: 29.7 g/dL — ABNORMAL LOW (ref 30.0–36.0)
MCV: 72.1 fL — ABNORMAL LOW (ref 80.0–100.0)
Platelets: 237 10*3/uL (ref 150–400)
RBC: 4.81 MIL/uL (ref 4.22–5.81)
RDW: 15.5 % (ref 11.5–15.5)
WBC: 9.2 10*3/uL (ref 4.0–10.5)
nRBC: 0 % (ref 0.0–0.2)

## 2019-06-03 LAB — COMPREHENSIVE METABOLIC PANEL
ALT: 16 U/L (ref 0–44)
AST: 23 U/L (ref 15–41)
Albumin: 3.4 g/dL — ABNORMAL LOW (ref 3.5–5.0)
Alkaline Phosphatase: 64 U/L (ref 38–126)
Anion gap: 13 (ref 5–15)
BUN: 21 mg/dL (ref 8–23)
CO2: 31 mmol/L (ref 22–32)
Calcium: 8.5 mg/dL — ABNORMAL LOW (ref 8.9–10.3)
Chloride: 96 mmol/L — ABNORMAL LOW (ref 98–111)
Creatinine, Ser: 0.85 mg/dL (ref 0.61–1.24)
Creatinine, Ser: 1.51 mg/dL — ABNORMAL HIGH (ref 0.61–1.24)
GFR calc Af Amer: >60 mL/min (ref >60)
GFR calc Af Amer: 48 mL/min — ABNORMAL LOW (ref >60)
GFR calc non Af Amer: >60 mL/min (ref >60)
GFR calc non Af Amer: 41 mL/min — ABNORMAL LOW (ref >60)
Glucose, Bld: 124 mg/dL — ABNORMAL HIGH (ref 70–99)
Potassium: 2.8 mmol/L — ABNORMAL LOW (ref 3.5–5.1)
Sodium: 140 mmol/L (ref 135–145)
Total Bilirubin: 0.9 mg/dL (ref 0.3–1.2)
Total Protein: 6.6 g/dL (ref 6.5–8.1)

## 2019-06-03 MED ORDER — ASPIRIN EC 81 MG PO TBEC
81.0000 mg | DELAYED_RELEASE_TABLET | Freq: Every day | ORAL | Status: DC
  Administered 2019-06-03 – 2019-06-08 (×6): 81 mg via ORAL
  Filled 2019-06-03 (×6): qty 1

## 2019-06-03 MED ORDER — METOPROLOL TARTRATE 25 MG PO TABS
25.0000 mg | ORAL_TABLET | Freq: Two times a day (BID) | ORAL | Status: DC
  Administered 2019-06-03 – 2019-06-08 (×11): 25 mg via ORAL
  Filled 2019-06-03 (×11): qty 1

## 2019-06-03 MED ORDER — TRAMADOL HCL 50 MG PO TABS
50.0000 mg | ORAL_TABLET | Freq: Four times a day (QID) | ORAL | Status: DC | PRN
  Administered 2019-06-03 – 2019-06-07 (×2): 50 mg via ORAL
  Filled 2019-06-03 (×3): qty 1

## 2019-06-03 MED ORDER — ONDANSETRON HCL 4 MG/2ML IJ SOLN
4.0000 mg | Freq: Four times a day (QID) | INTRAMUSCULAR | Status: DC | PRN
  Administered 2019-06-05 – 2019-06-07 (×2): 4 mg via INTRAVENOUS
  Filled 2019-06-03 (×2): qty 2

## 2019-06-03 MED ORDER — POTASSIUM CHLORIDE CRYS ER 20 MEQ PO TBCR: 40.0000 meq | EXTENDED_RELEASE_TABLET | Freq: Two times a day (BID) | ORAL | Status: DC

## 2019-06-03 MED ORDER — TERAZOSIN HCL 2 MG PO CAPS
4.0000 mg | ORAL_CAPSULE | Freq: Every day | ORAL | Status: DC
  Administered 2019-06-03 – 2019-06-08 (×6): 4 mg via ORAL
  Filled 2019-06-03 (×6): qty 2

## 2019-06-03 MED ORDER — CLOPIDOGREL BISULFATE 75 MG PO TABS
75.0000 mg | ORAL_TABLET | Freq: Every day | ORAL | Status: DC
  Administered 2019-06-03 – 2019-06-08 (×6): 75 mg via ORAL
  Filled 2019-06-03 (×6): qty 1

## 2019-06-03 MED ORDER — POTASSIUM CHLORIDE CRYS ER 20 MEQ PO TBCR
40.0000 meq | EXTENDED_RELEASE_TABLET | Freq: Two times a day (BID) | ORAL | Status: DC
  Administered 2019-06-03 – 2019-06-04 (×4): 40 meq via ORAL
  Filled 2019-06-03 (×4): qty 2

## 2019-06-03 MED ORDER — POTASSIUM CHLORIDE CRYS ER 20 MEQ PO TBCR: 20.0000 meq | EXTENDED_RELEASE_TABLET | Freq: Every day | ORAL | Status: DC

## 2019-06-03 MED ORDER — SIMVASTATIN 10 MG PO TABS
20.0000 mg | ORAL_TABLET | Freq: Every day | ORAL | Status: DC
  Administered 2019-06-03 – 2019-06-07 (×5): 20 mg via ORAL
  Filled 2019-06-03 (×5): qty 2

## 2019-06-03 MED ORDER — VITAMIN B-12 1000 MCG PO TABS
1000.0000 ug | ORAL_TABLET | Freq: Every day | ORAL | Status: DC
  Administered 2019-06-03 – 2019-06-07 (×4): 1000 ug via ORAL
  Filled 2019-06-03 (×5): qty 1

## 2019-06-03 MED ORDER — INSULIN ASPART 100 UNIT/ML ~~LOC~~ SOLN
5.0000 [IU] | Freq: Three times a day (TID) | SUBCUTANEOUS | Status: DC
  Administered 2019-06-03 – 2019-06-06 (×6): 5 [IU] via SUBCUTANEOUS

## 2019-06-03 MED ORDER — POLYVINYL ALCOHOL 1.4 % OP SOLN
1.0000 [drp] | Freq: Three times a day (TID) | OPHTHALMIC | Status: DC | PRN
  Filled 2019-06-03: qty 15

## 2019-06-03 MED ORDER — PANTOPRAZOLE SODIUM 40 MG PO TBEC
40.0000 mg | DELAYED_RELEASE_TABLET | Freq: Every day | ORAL | Status: DC
  Administered 2019-06-03 – 2019-06-08 (×6): 40 mg via ORAL
  Filled 2019-06-03 (×6): qty 1

## 2019-06-03 MED ORDER — VITAMIN D 25 MCG (1000 UNIT) PO TABS
2000.0000 [IU] | ORAL_TABLET | Freq: Every day | ORAL | Status: DC
  Administered 2019-06-03 – 2019-06-07 (×5): 2000 [IU] via ORAL
  Filled 2019-06-03 (×5): qty 2

## 2019-06-03 MED ORDER — ENOXAPARIN SODIUM 60 MG/0.6ML ~~LOC~~ SOLN
0.5000 mg/kg | Freq: Every day | SUBCUTANEOUS | Status: DC
  Administered 2019-06-03 – 2019-06-13 (×11): 55 mg via SUBCUTANEOUS
  Filled 2019-06-03 (×11): qty 0.6

## 2019-06-03 MED ORDER — CYCLOBENZAPRINE HCL 5 MG PO TABS
5.0000 mg | ORAL_TABLET | Freq: Two times a day (BID) | ORAL | Status: DC | PRN
  Administered 2019-06-03: 5 mg via ORAL
  Filled 2019-06-03: qty 1

## 2019-06-03 MED ORDER — AMITRIPTYLINE HCL 10 MG PO TABS
10.0000 mg | ORAL_TABLET | Freq: Every day | ORAL | Status: DC
  Administered 2019-06-03 – 2019-06-07 (×5): 10 mg via ORAL
  Filled 2019-06-03 (×6): qty 1

## 2019-06-03 MED ORDER — INSULIN GLARGINE 100 UNIT/ML ~~LOC~~ SOLN
50.0000 [IU] | Freq: Two times a day (BID) | SUBCUTANEOUS | Status: DC
  Administered 2019-06-03 – 2019-06-08 (×11): 50 [IU] via SUBCUTANEOUS
  Filled 2019-06-03 (×13): qty 0.5

## 2019-06-03 MED ORDER — SENNOSIDES-DOCUSATE SODIUM 8.6-50 MG PO TABS
1.0000 | ORAL_TABLET | Freq: Two times a day (BID) | ORAL | Status: DC
  Administered 2019-06-03 – 2019-06-08 (×11): 1 via ORAL
  Filled 2019-06-03 (×11): qty 1

## 2019-06-03 MED ORDER — FUROSEMIDE 40 MG PO TABS
80.0000 mg | ORAL_TABLET | Freq: Two times a day (BID) | ORAL | Status: DC
  Administered 2019-06-03 – 2019-06-06 (×7): 80 mg via ORAL
  Filled 2019-06-03 (×7): qty 2

## 2019-06-03 NOTE — Plan of Care (Signed)
initiated

## 2019-06-03 NOTE — Progress Notes (Signed)
Jared Hall  PROGRESS NOTE    Jared Hall  WER:[id number] DOB: 12-Feb-1931 DOA: 06/02/2019 PCP: Josetta Huddle, MD   Brief Narrative:   Jared Hall  is a 85 y.o. male, w Anemia, Thallassemia, hypertension, hyperlipidemia, Dm2, h/o Stroke, Pafib, CHF (EF 55%),  OSA, Bph/ prostate cancer s/p XRT apparently present with c/o nausea as well as abdominal discomfort "cramping".  Pt denies fever, chills, cough, cp, palp, constipation, diarrhea, brbpr, black stool.     Assessment & Plan:   Principal Problem:   Hypokalemia Active Problems:   DM (diabetes mellitus) ([place])   History of CVA (cerebrovascular accident)   Essential hypertension, benign   GERD (gastroesophageal reflux disease)   DM type 2 with diabetic peripheral neuropathy (HCC)   Paroxysmal atrial fibrillation (HCC)   Chronic kidney disease, stage III (moderate) (HCC)   Anemia   Hypokalemia likely secondary to diuretics (lasix, metolazone)     - Consider endocrinology referral as outpatient to r/o Conns syndrome, Cushings, or CAH      - continue K+ 28mEq BID; monitor renal function panel     - MG2+ was ok, monitor  Pafib , CHF (EF 55%)     - Aspirin/Plavix     - metoprolol 25mg  po bid     - Lasix 80mg  po bid     - Hold Metolazone due to hypokalemia  DM2 w CKD stage3     - Cont Lantus 50 units [place] qday     - Cont novlog 5 units [place] tid ac meals     - fsbs ac and qhs, ISS  Diabetic neuropathy, Chronic pain     - Cont Tramadol 50mg  po q6h prn, Flexeril 5mg  po bid prn  Hyperlipidemia, h/o CVA     - Simvastatin 20mg  po qhs     - Cont aspirin, plavix as above  Gerd     - Cont protonix   BPH     - Cont Terazosin 4mg  po qhs  Constipation     - Cont Senokot-S 1 po qday  Anemia, Thallasemia     - Hgb 10.3; MCV 72  UA pending. K+ still low (2.8). Let's increase PO K+ to 68mEq BID. Mg2+ ok. Monitor. No fevers ON. Looks good this AM. Continue as above.    DVT prophylaxis: lovenox Code Status: DNR   Disposition Plan: TBD    Subjective: No acute events ON per nursing.  Objective: Vitals:   06/02/19 2320 06/03/19 0329 06/03/19 0456 06/03/19 0500  BP: 113/63  109/61   Pulse: 95  97   Resp: 16  16   Temp: 97.6 F (36.4 C)  98.1 F (36.7 C)   TempSrc:      SpO2: 100%  98%   Weight:    112.9 kg  Height:  5\' 10"  (1.778 m)      Intake/Output Summary (Last 24 hours) at 06/03/2019 1057 Last data filed at 06/03/2019 0556 Gross per 24 hour  Intake 300 ml  Output 175 ml  Net 125 ml   Filed Weights   06/03/19 0500  Weight: 112.9 kg    Examination:  General exam: 85 y.o. male Appears calm and comfortable  Respiratory system: Clear to auscultation. Respiratory effort normal. Cardiovascular system: S1 & S2 heard, RRR. No JVD, murmurs, rubs, gallops or clicks. No pedal edema. Gastrointestinal system: Abdomen is nondistended, soft and nontender. No organomegaly or masses felt. Normal bowel sounds heard. Central nervous system: Alert and oriented. Follows commands. Extremities: Symmetric 5 x 5  power.    Data Reviewed: I have personally reviewed following labs and imaging studies.  CBC: Recent Labs  Lab 06/02/19 1608 06/03/19 0847  WBC 10.2 9.2  HGB 11.2* 10.3*  HCT 37.0* 34.7*  MCV 71.0* 72.1*  PLT 267 704   Basic Metabolic Panel: Recent Labs  Lab 06/02/19 1608 06/02/19 2145 06/03/19 0640 06/03/19 0847  NA 140  --  QUESTIONABLE RESULTS, RECOMMEND RECOLLECT TO VERIFY 140  K 2.3*  --  QUESTIONABLE RESULTS, RECOMMEND RECOLLECT TO VERIFY 2.8*  CL 91*  --  QUESTIONABLE RESULTS, RECOMMEND RECOLLECT TO VERIFY 96*  CO2 33*  --  QUESTIONABLE RESULTS, RECOMMEND RECOLLECT TO VERIFY 31  GLUCOSE 123*  --  QUESTIONABLE RESULTS, RECOMMEND RECOLLECT TO VERIFY 124*  BUN 29*  --  QUESTIONABLE RESULTS, RECOMMEND RECOLLECT TO VERIFY 21  CREATININE 1.88*  --  0.85 1.51*  CALCIUM 9.2  --  QUESTIONABLE RESULTS, RECOMMEND RECOLLECT TO VERIFY 8.5*  MG  --  1.7  --   --    GFR: Estimated Creatinine Clearance:  44.2 mL/min (A) (by C-G formula based on SCr of 1.51 mg/dL (H)). Liver Function Tests: Recent Labs  Lab 06/02/19 1608 06/03/19 0640 06/03/19 0847  AST 25 QUESTIONABLE RESULTS, RECOMMEND RECOLLECT TO VERIFY 23  ALT 19 QUESTIONABLE RESULTS, RECOMMEND RECOLLECT TO VERIFY 16  ALKPHOS 74 QUESTIONABLE RESULTS, RECOMMEND RECOLLECT TO VERIFY 64  BILITOT 0.8 QUESTIONABLE RESULTS, RECOMMEND RECOLLECT TO VERIFY 0.9  PROT 7.3 QUESTIONABLE RESULTS, RECOMMEND RECOLLECT TO VERIFY 6.6  ALBUMIN 3.9 QUESTIONABLE RESULTS, RECOMMEND RECOLLECT TO VERIFY 3.4*   Recent Labs  Lab 06/02/19 1608  LIPASE 29   No results for input(s): AMMONIA in the last 168 hours. Coagulation Profile: No results for input(s): INR, PROTIME in the last 168 hours. Cardiac Enzymes: No results for input(s): CKTOTAL, CKMB, CKMBINDEX, TROPONINI in the last 168 hours. BNP (last 3 results) No results for input(s): PROBNP in the last 8760 hours. HbA1C: No results for input(s): HGBA1C in the last 72 hours. CBG: Recent Labs  Lab 06/03/19 0049 06/03/19 0739  GLUCAP 116* 117*   Lipid Profile: No results for input(s): CHOL, HDL, LDLCALC, TRIG, CHOLHDL, LDLDIRECT in the last 72 hours. Thyroid Function Tests: No results for input(s): TSH, T4TOTAL, FREET4, T3FREE, THYROIDAB in the last 72 hours. Anemia Panel: No results for input(s): VITAMINB12, FOLATE, FERRITIN, TIBC, IRON, RETICCTPCT in the last 72 hours. Sepsis Labs: No results for input(s): PROCALCITON, LATICACIDVEN in the last 168 hours.  Recent Results (from the past 240 hour(s))  SARS Coronavirus 2 (CEPHEID - Performed in [place] hospital lab), Hosp Order     Status: None   Collection Time: 06/02/19  8:17 PM   Specimen: Nasopharyngeal Swab  Result Value Ref Range Status   SARS Coronavirus 2 NEGATIVE NEGATIVE Final    Comment: (NOTE) If result is NEGATIVE SARS-CoV-2 target nucleic acids are NOT DETECTED. The SARS-CoV-2 RNA is generally detectable in upper and lower   respiratory specimens during the acute phase of infection. The lowest  concentration of SARS-CoV-2 viral copies this assay can detect is 250  copies / mL. A negative result does not preclude SARS-CoV-2 infection  and should not be used as the sole basis for treatment or other  patient management decisions.  A negative result may occur with  improper specimen collection / handling, submission of specimen other  than nasopharyngeal swab, presence of viral mutation(s) within the  areas targeted by this assay, and inadequate number of viral copies  (<250 copies / mL).  A negative result must be combined with clinical  observations, patient history, and epidemiological information. If result is POSITIVE SARS-CoV-2 target nucleic acids are DETECTED. The SARS-CoV-2 RNA is generally detectable in upper and lower  respiratory specimens dur ing the acute phase of infection.  Positive  results are indicative of active infection with SARS-CoV-2.  Clinical  correlation with patient history and other diagnostic information is  necessary to determine patient infection status.  Positive results do  not rule out bacterial infection or co-infection with other viruses. If result is PRESUMPTIVE POSTIVE SARS-CoV-2 nucleic acids MAY BE PRESENT.   A presumptive positive result was obtained on the submitted specimen  and confirmed on repeat testing.  While 2019 novel coronavirus  (SARS-CoV-2) nucleic acids may be present in the submitted sample  additional confirmatory testing may be necessary for epidemiological  and / or clinical management purposes  to differentiate between  SARS-CoV-2 and other Sarbecovirus currently known to infect humans.  If clinically indicated additional testing with an alternate test  methodology [phone]) is advised. The SARS-CoV-2 RNA is generally  detectable in upper and lower respiratory sp ecimens during the acute  phase of infection. The expected result is Negative. Fact  Sheet for Patients:  StrictlyIdeas.no Fact Sheet for Healthcare Providers: BankingDealers.co.za This test is not yet approved or cleared by the [place] FDA and has been authorized for detection and/or diagnosis of SARS-CoV-2 by FDA under an Emergency Use Authorization (EUA).  This EUA will remain in effect (meaning this test can be used) for the duration of the COVID-19 declaration under Section 564(b)(1) of the Act, 21 U.S.C. section 360bbb-3(b)(1), unless the authorization is terminated or revoked sooner. Performed at [facility], [place] [address], [place], [place] [address]          Radiology Studies: Ct Abdomen Pelvis Wo Contrast  Result Date: 06/02/2019 CLINICAL DATA:  Abdominal pain for several days EXAM: CT ABDOMEN AND PELVIS WITHOUT CONTRAST TECHNIQUE: Multidetector CT imaging of the abdomen and pelvis was performed following the standard protocol without IV contrast. COMPARISON:  04/04/2013 FINDINGS: Lower chest: Lung bases demonstrate mild scarring in the left base. Mild right basilar atelectasis is seen without acute effusion. Liver is within normal limits. The gallbladder is well distended with some gallstones within. No obstructive changes are seen. Hepatobiliary: No focal liver abnormality is seen. No gallstones, gallbladder wall thickening, or biliary dilatation. Pancreas: Unremarkable. No pancreatic ductal dilatation or surrounding inflammatory changes. Spleen: Normal in size without focal abnormality. Adrenals/Urinary Tract: Adrenal glands are within normal limits. Kidneys are well visualized bilaterally with tiny nonobstructing renal stone in the upper pole of the right kidney. Large exophytic cyst from the right kidney is seen. It measures approximately 6.1 cm in greatest dimension. This has enlarged in the interval from the prior study. No obstructive changes are seen. The ureters are well visualized to  the bladder. The bladder is decompressed. Stomach/Bowel: The appendix is within normal limits. No obstructive or inflammatory changes of the larger small-bowel are seen. The stomach is decompressed. Vascular/Lymphatic: Aortic atherosclerosis. No enlarged abdominal or pelvic lymph nodes. Reproductive: Prostate is unremarkable. Other: No abdominal wall hernia or abnormality. No abdominopelvic ascites. Musculoskeletal: Degenerative changes of lumbar spine are noted. IMPRESSION: Mild right basilar atelectasis. Tiny nonobstructing right renal stone. Right renal cysts larger than that seen on the prior exam. No other acute abnormality is seen. Electronically Signed   By: Inez Catalina M.D.   On: 06/02/2019 17:59   Dg Chest [facility]  1 View  Result Date: 06/02/2019 CLINICAL DATA:  Fever EXAM: PORTABLE CHEST 1 VIEW COMPARISON:  05/27/2018 FINDINGS: Bibasilar atelectasis left greater than right is unchanged. Negative for heart failure. No definite pleural effusion. Small left effusion not excluded. Atherosclerotic aortic arch. IMPRESSION: Bibasilar atelectasis/infiltrate left greater than right unchanged from 05/27/2018. No new findings. Electronically Signed   By: Franchot Gallo M.D.   On: 06/02/2019 21:13        Scheduled Meds: . amitriptyline  10 mg Oral QHS  . aspirin EC  81 mg Oral Daily  . cholecalciferol  2,000 Units Oral Daily  . clopidogrel  75 mg Oral Daily  . enoxaparin (LOVENOX) injection  30 mg Subcutaneous QHS  . furosemide  80 mg Oral BID  . insulin aspart  0-5 Units Subcutaneous QHS  . insulin aspart  0-9 Units Subcutaneous TID WC  . insulin aspart  5 Units Subcutaneous TID WC  . insulin glargine  50 Units Subcutaneous BID  . metoprolol tartrate  25 mg Oral BID  . pantoprazole  40 mg Oral Daily  . potassium chloride SA  40 mEq Oral BID  . senna-docusate  1 tablet Oral BID  . simvastatin  20 mg Oral Daily  . sodium chloride flush  3 mL Intravenous Q12H  . terazosin  4 mg Oral Daily  .  vitamin B-12  1,000 mcg Oral Daily   Continuous Infusions: . sodium chloride       LOS: 0 days    Time spent: 25 minutes spent in the coordination of care today.   Jonnie Finner, DO [facility] Pager [phone]  If 7PM-7AM, please contact night-coverage [email or web address] Password [facility] 06/03/2019, 10:57 AM

## 2019-06-03 NOTE — Progress Notes (Signed)
Patient refuses the use of nocturnal CPAP tonight. RT will try again at a later time.

## 2019-06-03 NOTE — Progress Notes (Signed)
Page sent per pt request for additional medication for abd pain and lower ext cramping

## 2019-06-04 DIAGNOSIS — R109 Unspecified abdominal pain: Secondary | ICD-10-CM

## 2019-06-04 LAB — RENAL FUNCTION PANEL
Albumin: 3.5 g/dL (ref 3.5–5.0)
Anion gap: 12 (ref 5–15)
BUN: 18 mg/dL (ref 8–23)
CO2: 37 mmol/L — ABNORMAL HIGH (ref 22–32)
Calcium: 9.4 mg/dL (ref 8.9–10.3)
Chloride: 94 mmol/L — ABNORMAL LOW (ref 98–111)
Creatinine, Ser: 1.44 mg/dL — ABNORMAL HIGH (ref 0.61–1.24)
GFR calc Af Amer: 51 mL/min — ABNORMAL LOW (ref >60)
GFR calc non Af Amer: 44 mL/min — ABNORMAL LOW (ref >60)
Glucose, Bld: 94 mg/dL (ref 70–99)
Phosphorus: 2.3 mg/dL — ABNORMAL LOW (ref 2.5–4.6)
Potassium: 3.1 mmol/L — ABNORMAL LOW (ref 3.5–5.1)
Sodium: 143 mmol/L (ref 135–145)

## 2019-06-04 LAB — CBC WITH DIFFERENTIAL/PLATELET
Abs Immature Granulocytes: 0.03 10*3/uL (ref 0.00–0.07)
Basophils Absolute: 0 10*3/uL (ref 0.0–0.1)
Basophils Relative: 0 %
Eosinophils Absolute: 0.3 10*3/uL (ref 0.0–0.5)
Eosinophils Relative: 3 %
HCT: 36.6 % — ABNORMAL LOW (ref 39.0–52.0)
Hemoglobin: 10.5 g/dL — ABNORMAL LOW (ref 13.0–17.0)
Immature Granulocytes: 0 %
Lymphocytes Relative: 16 %
Lymphs Abs: 1.5 10*3/uL (ref 0.7–4.0)
MCH: 21 pg — ABNORMAL LOW (ref 26.0–34.0)
MCHC: 28.7 g/dL — ABNORMAL LOW (ref 30.0–36.0)
MCV: 73.2 fL — ABNORMAL LOW (ref 80.0–100.0)
Monocytes Absolute: 1.5 10*3/uL — ABNORMAL HIGH (ref 0.1–1.0)
Monocytes Relative: 16 %
Neutro Abs: 6.1 10*3/uL (ref 1.7–7.7)
Neutrophils Relative %: 65 %
Platelets: 235 10*3/uL (ref 150–400)
RBC: 5 MIL/uL (ref 4.22–5.81)
RDW: 15.5 % (ref 11.5–15.5)
WBC: 9.3 10*3/uL (ref 4.0–10.5)
nRBC: 0 % (ref 0.0–0.2)

## 2019-06-04 LAB — GLUCOSE, CAPILLARY
Glucose-Capillary: 92 mg/dL (ref 70–99)
Glucose-Capillary: 166 mg/dL — ABNORMAL HIGH (ref 70–99)
Glucose-Capillary: 115 mg/dL — ABNORMAL HIGH (ref 70–99)
Glucose-Capillary: 170 mg/dL — ABNORMAL HIGH (ref 70–99)

## 2019-06-04 LAB — MAGNESIUM: Magnesium: 1.7 mg/dL (ref 1.7–2.4)

## 2019-06-04 LAB — MRSA PCR SCREENING: MRSA by PCR: POSITIVE — AB

## 2019-06-04 MED ORDER — LACTULOSE 10 GM/15ML PO SOLN
20.0000 g | ORAL | Status: AC
  Administered 2019-06-04: 20 g via ORAL
  Filled 2019-06-04: qty 30

## 2019-06-04 NOTE — Progress Notes (Signed)
PROGRESS NOTE    Jared Hall  LSL:[id number] DOB: [birth year] DOA: 06/02/2019 PCP: Josetta Huddle, MD    Brief Narrative:  85 y.o.male,w Anemia, Thallassemia, hypertension, hyperlipidemia, Dm2, h/o Stroke, Pafib, CHF (EF 55%), OSA, Bph/ prostate cancer s/p XRT apparently present with c/o nausea as well as abdominal discomfort "cramping". Pt denies fever, chills, cough, cp, palp, constipation, diarrhea, brbpr, black stool.   Assessment & Plan:   Principal Problem:   Hypokalemia Active Problems:   DM (diabetes mellitus) ([place])   History of CVA (cerebrovascular accident)   Essential hypertension, benign   GERD (gastroesophageal reflux disease)   DM type 2 with diabetic peripheral neuropathy (HCC)   Paroxysmal atrial fibrillation (HCC)   Chronic kidney disease, stage III (moderate) (HCC)   Anemia  Hypokalemia likely secondary to diuretics (lasix, metolazone)     - Consider endocrinology referral as outpatient when stable to r/o Conns syndrome, Cushings, or CAH      - continue on K+ 53mEq BID     - MG2+ was ok, monitor     - repeat bmet in AM  Pafib , CHF (EF 55%)     - Continue with Aspirin/Plavix     - metoprolol 25mg  po bid     - Continued with Lasix 80mg  po bid     - Presently holding Metolazone due to hypokalemia     - appears euvolemic at this time  DM2 w CKD stage3     - Cont Lantus 50 units [place] qday     - Cont novlog 5 units [place] tid ac meals     - fsbs ac and qhs, ISS     - stable at this time  Diabetic neuropathy, Chronic pain     - Cont Tramadol 50mg  po q6h prn, Flexeril 5mg  po bid prn    - presently stable  Hyperlipidemia, h/o CVA     - Simvastatin 20mg  po qhs     - Cont aspirin, cont on plavix as tolerated    - Stable at present  Gerd     - Cont with protonix as tolerated   BPH     - Cont Terazosin 4mg  po qhs     - Seems better at this time  Constipation     - No significant BM overnight despite cathartics     - Will give trial of lactulose      - Pt with poor PO intake and nausea likely as a result of consitipation thus will need to treat to allow pt to tolerate PO, otherwise, there is high likelihood of dehydration and early re-admission  Anemia, Thallasemia     - Hgb 10.3; MCV 72  DVT prophylaxis: Lovenox subQ Code Status: DNR Family Communication: Pt in room, family not at bedside Disposition Plan: Uncertain at this time  Consultants:     Procedures:     Antimicrobials: Anti-infectives (From admission, onward)   None       Subjective: Unable to assess. Pt not very verbal  Objective: Vitals:   06/03/19 2050 06/04/19 0650 06/04/19 1416 06/04/19 1526  BP: (!) 117/57 (!) 123/56 106/64 (!) 100/54  Pulse: 84 73 (!) 54 80  Resp: (!) 21 20 16 19   Temp: 98.5 F (36.9 C) 98.2 F (36.8 C) 98 F (36.7 C)   TempSrc: Oral Oral Oral   SpO2: 100% 100% 100% 100%  Weight:      Height:        Intake/Output Summary (Last 24 hours) at  06/04/2019 1631 Last data filed at 06/04/2019 1518 Gross per 24 hour  Intake 1274 ml  Output --  Net 1274 ml   Filed Weights   06/03/19 0500  Weight: 112.9 kg    Examination:  General exam: Appears calm and comfortable  Respiratory system: Clear to auscultation. Respiratory effort normal. Cardiovascular system: S1 & S2 heard, RRR Gastrointestinal system: Abdomen is nondistended, soft and nontender. No organomegaly or masses felt. Normal bowel sounds heard. Central nervous system: awake. No focal neurological deficits. Extremities: Symmetric 5 x 5 power. Skin: No rashes, lesions  Psychiatry: Unable to assess as pt not very verbal this AM   Data Reviewed: I have personally reviewed following labs and imaging studies  CBC: Recent Labs  Lab 06/02/19 1608 06/03/19 0847 06/04/19 0549  WBC 10.2 9.2 9.3  NEUTROABS  --   --  6.1  HGB 11.2* 10.3* 10.5*  HCT 37.0* 34.7* 36.6*  MCV 71.0* 72.1* 73.2*  PLT 267 237 245   Basic Metabolic Panel: Recent Labs  Lab  06/02/19 1608 06/02/19 2145 06/03/19 0640 06/03/19 0847 06/04/19 0549  NA 140  --  QUESTIONABLE RESULTS, RECOMMEND RECOLLECT TO VERIFY 140 143  K 2.3*  --  QUESTIONABLE RESULTS, RECOMMEND RECOLLECT TO VERIFY 2.8* 3.1*  CL 91*  --  QUESTIONABLE RESULTS, RECOMMEND RECOLLECT TO VERIFY 96* 94*  CO2 33*  --  QUESTIONABLE RESULTS, RECOMMEND RECOLLECT TO VERIFY 31 37*  GLUCOSE 123*  --  QUESTIONABLE RESULTS, RECOMMEND RECOLLECT TO VERIFY 124* 94  BUN 29*  --  QUESTIONABLE RESULTS, RECOMMEND RECOLLECT TO VERIFY 21 18  CREATININE 1.88*  --  0.85 1.51* 1.44*  CALCIUM 9.2  --  QUESTIONABLE RESULTS, RECOMMEND RECOLLECT TO VERIFY 8.5* 9.4  MG  --  1.7  --   --  1.7  PHOS  --   --   --   --  2.3*   GFR: Estimated Creatinine Clearance: 46.4 mL/min (A) (by C-G formula based on SCr of 1.44 mg/dL (H)). Liver Function Tests: Recent Labs  Lab 06/02/19 1608 06/03/19 0640 06/03/19 0847 06/04/19 0549  AST 25 QUESTIONABLE RESULTS, RECOMMEND RECOLLECT TO VERIFY 23  --   ALT 19 QUESTIONABLE RESULTS, RECOMMEND RECOLLECT TO VERIFY 16  --   ALKPHOS 74 QUESTIONABLE RESULTS, RECOMMEND RECOLLECT TO VERIFY 64  --   BILITOT 0.8 QUESTIONABLE RESULTS, RECOMMEND RECOLLECT TO VERIFY 0.9  --   PROT 7.3 QUESTIONABLE RESULTS, RECOMMEND RECOLLECT TO VERIFY 6.6  --   ALBUMIN 3.9 QUESTIONABLE RESULTS, RECOMMEND RECOLLECT TO VERIFY 3.4* 3.5   Recent Labs  Lab 06/02/19 1608  LIPASE 29   No results for input(s): AMMONIA in the last 168 hours. Coagulation Profile: No results for input(s): INR, PROTIME in the last 168 hours. Cardiac Enzymes: No results for input(s): CKTOTAL, CKMB, CKMBINDEX, TROPONINI in the last 168 hours. BNP (last 3 results) No results for input(s): PROBNP in the last 8760 hours. HbA1C: No results for input(s): HGBA1C in the last 72 hours. CBG: Recent Labs  Lab 06/03/19 1206 06/03/19 1641 06/03/19 2052 06/04/19 0748 06/04/19 1201  GLUCAP 141* 113* 143* 92 166*   Lipid Profile: No results  for input(s): CHOL, HDL, LDLCALC, TRIG, CHOLHDL, LDLDIRECT in the last 72 hours. Thyroid Function Tests: No results for input(s): TSH, T4TOTAL, FREET4, T3FREE, THYROIDAB in the last 72 hours. Anemia Panel: No results for input(s): VITAMINB12, FOLATE, FERRITIN, TIBC, IRON, RETICCTPCT in the last 72 hours. Sepsis Labs: No results for input(s): PROCALCITON, LATICACIDVEN in the last 168 hours.  Recent  Results (from the past 240 hour(s))  SARS Coronavirus 2 (CEPHEID - Performed in [place] hospital lab), Hosp Order     Status: None   Collection Time: 06/02/19  8:17 PM   Specimen: Nasopharyngeal Swab  Result Value Ref Range Status   SARS Coronavirus 2 NEGATIVE NEGATIVE Final    Comment: (NOTE) If result is NEGATIVE SARS-CoV-2 target nucleic acids are NOT DETECTED. The SARS-CoV-2 RNA is generally detectable in upper and lower  respiratory specimens during the acute phase of infection. The lowest  concentration of SARS-CoV-2 viral copies this assay can detect is 250  copies / mL. A negative result does not preclude SARS-CoV-2 infection  and should not be used as the sole basis for treatment or other  patient management decisions.  A negative result may occur with  improper specimen collection / handling, submission of specimen other  than nasopharyngeal swab, presence of viral mutation(s) within the  areas targeted by this assay, and inadequate number of viral copies  (<250 copies / mL). A negative result must be combined with clinical  observations, patient history, and epidemiological information. If result is POSITIVE SARS-CoV-2 target nucleic acids are DETECTED. The SARS-CoV-2 RNA is generally detectable in upper and lower  respiratory specimens dur ing the acute phase of infection.  Positive  results are indicative of active infection with SARS-CoV-2.  Clinical  correlation with patient history and other diagnostic information is  necessary to determine patient infection status.   Positive results do  not rule out bacterial infection or co-infection with other viruses. If result is PRESUMPTIVE POSTIVE SARS-CoV-2 nucleic acids MAY BE PRESENT.   A presumptive positive result was obtained on the submitted specimen  and confirmed on repeat testing.  While 2019 novel coronavirus  (SARS-CoV-2) nucleic acids may be present in the submitted sample  additional confirmatory testing may be necessary for epidemiological  and / or clinical management purposes  to differentiate between  SARS-CoV-2 and other Sarbecovirus currently known to infect humans.  If clinically indicated additional testing with an alternate test  methodology [phone]) is advised. The SARS-CoV-2 RNA is generally  detectable in upper and lower respiratory sp ecimens during the acute  phase of infection. The expected result is Negative. Fact Sheet for Patients:  StrictlyIdeas.no Fact Sheet for Healthcare Providers: BankingDealers.co.za This test is not yet approved or cleared by the [place] FDA and has been authorized for detection and/or diagnosis of SARS-CoV-2 by FDA under an Emergency Use Authorization (EUA).  This EUA will remain in effect (meaning this test can be used) for the duration of the COVID-19 declaration under Section 564(b)(1) of the Act, 21 U.S.C. section 360bbb-3(b)(1), unless the authorization is terminated or revoked sooner. Performed at [facility], [place] [address] [place], [place] [address]   MRSA PCR Screening     Status: Abnormal   Collection Time: 06/03/19  6:16 PM   Specimen: Nasopharyngeal  Result Value Ref Range Status   MRSA by PCR POSITIVE (A) NEGATIVE Final    Comment:        The GeneXpert MRSA Assay (FDA approved for NASAL specimens only), is one component of a comprehensive MRSA colonization surveillance program. It is not intended to diagnose MRSA infection nor to guide or monitor treatment  for MRSA infections. RESULT CALLED TO, READ BACK BY AND VERIFIED WITHMargarita Grizzle RN [id number] 06/04/19 A NAVARRO Performed at [facility], [place] [address] [place], [place] [address]      Radiology Studies:  Ct Abdomen Pelvis Wo Contrast  Result Date: 06/02/2019 CLINICAL DATA:  Abdominal pain for several days EXAM: CT ABDOMEN AND PELVIS WITHOUT CONTRAST TECHNIQUE: Multidetector CT imaging of the abdomen and pelvis was performed following the standard protocol without IV contrast. COMPARISON:  04/04/2013 FINDINGS: Lower chest: Lung bases demonstrate mild scarring in the left base. Mild right basilar atelectasis is seen without acute effusion. Liver is within normal limits. The gallbladder is well distended with some gallstones within. No obstructive changes are seen. Hepatobiliary: No focal liver abnormality is seen. No gallstones, gallbladder wall thickening, or biliary dilatation. Pancreas: Unremarkable. No pancreatic ductal dilatation or surrounding inflammatory changes. Spleen: Normal in size without focal abnormality. Adrenals/Urinary Tract: Adrenal glands are within normal limits. Kidneys are well visualized bilaterally with tiny nonobstructing renal stone in the upper pole of the right kidney. Large exophytic cyst from the right kidney is seen. It measures approximately 6.1 cm in greatest dimension. This has enlarged in the interval from the prior study. No obstructive changes are seen. The ureters are well visualized to the bladder. The bladder is decompressed. Stomach/Bowel: The appendix is within normal limits. No obstructive or inflammatory changes of the larger small-bowel are seen. The stomach is decompressed. Vascular/Lymphatic: Aortic atherosclerosis. No enlarged abdominal or pelvic lymph nodes. Reproductive: Prostate is unremarkable. Other: No abdominal wall hernia or abnormality. No abdominopelvic ascites. Musculoskeletal: Degenerative changes of lumbar spine are noted.  IMPRESSION: Mild right basilar atelectasis. Tiny nonobstructing right renal stone. Right renal cysts larger than that seen on the prior exam. No other acute abnormality is seen. Electronically Signed   By: Inez Catalina M.D.   On: 06/02/2019 17:59   Dg Chest Port 1 View  Result Date: 06/02/2019 CLINICAL DATA:  Fever EXAM: PORTABLE CHEST 1 VIEW COMPARISON:  05/27/2018 FINDINGS: Bibasilar atelectasis left greater than right is unchanged. Negative for heart failure. No definite pleural effusion. Small left effusion not excluded. Atherosclerotic aortic arch. IMPRESSION: Bibasilar atelectasis/infiltrate left greater than right unchanged from 05/27/2018. No new findings. Electronically Signed   By: Franchot Gallo M.D.   On: 06/02/2019 21:13    Scheduled Meds:  amitriptyline  10 mg Oral QHS   aspirin EC  81 mg Oral Daily   cholecalciferol  2,000 Units Oral Daily   clopidogrel  75 mg Oral Daily   enoxaparin (LOVENOX) injection  0.5 mg/kg Subcutaneous QHS   furosemide  80 mg Oral BID   insulin aspart  0-5 Units Subcutaneous QHS   insulin aspart  0-9 Units Subcutaneous TID WC   insulin aspart  5 Units Subcutaneous TID WC   insulin glargine  50 Units Subcutaneous BID   metoprolol tartrate  25 mg Oral BID   pantoprazole  40 mg Oral Daily   potassium chloride SA  40 mEq Oral BID   senna-docusate  1 tablet Oral BID   simvastatin  20 mg Oral Daily   sodium chloride flush  3 mL Intravenous Q12H   terazosin  4 mg Oral Daily   vitamin B-12  1,000 mcg Oral Daily   Continuous Infusions:  sodium chloride       LOS: 1 day   Marylu Lund, MD [facility] Pager On Amion  If 7PM-7AM, please contact night-coverage 06/04/2019, 4:31 PM

## 2019-06-04 NOTE — Progress Notes (Signed)
Patient continues to refuse nocturnal CPAP. Order changed to prn per RT protocol.  

## 2019-06-05 LAB — BASIC METABOLIC PANEL
Anion gap: 11 (ref 5–15)
BUN: 18 mg/dL (ref 8–23)
CO2: 36 mmol/L — ABNORMAL HIGH (ref 22–32)
Calcium: 9.4 mg/dL (ref 8.9–10.3)
Chloride: 93 mmol/L — ABNORMAL LOW (ref 98–111)
Creatinine, Ser: 1.46 mg/dL — ABNORMAL HIGH (ref 0.61–1.24)
GFR calc Af Amer: 50 mL/min — ABNORMAL LOW (ref >60)
GFR calc non Af Amer: 43 mL/min — ABNORMAL LOW (ref >60)
Glucose, Bld: 56 mg/dL — ABNORMAL LOW (ref 70–99)
Potassium: 3.2 mmol/L — ABNORMAL LOW (ref 3.5–5.1)
Sodium: 140 mmol/L (ref 135–145)

## 2019-06-05 LAB — GLUCOSE, CAPILLARY
Glucose-Capillary: 76 mg/dL (ref 70–99)
Glucose-Capillary: 124 mg/dL — ABNORMAL HIGH (ref 70–99)
Glucose-Capillary: 109 mg/dL — ABNORMAL HIGH (ref 70–99)
Glucose-Capillary: 165 mg/dL — ABNORMAL HIGH (ref 70–99)

## 2019-06-05 MED ORDER — MUPIROCIN 2 % EX OINT
TOPICAL_OINTMENT | Freq: Two times a day (BID) | CUTANEOUS | Status: DC
  Administered 2019-06-05 – 2019-06-10 (×12): via NASAL
  Administered 2019-06-11: 1 via NASAL
  Administered 2019-06-11 – 2019-06-13 (×4): via NASAL
  Administered 2019-06-13: 1 via NASAL
  Administered 2019-06-14: 11:00:00 via NASAL
  Filled 2019-06-05: qty 22

## 2019-06-05 MED ORDER — CHLORHEXIDINE GLUCONATE CLOTH 2 % EX PADS
6.0000 | MEDICATED_PAD | Freq: Every day | CUTANEOUS | Status: DC
  Administered 2019-06-05 – 2019-06-14 (×8): 6 via TOPICAL

## 2019-06-05 MED ORDER — POTASSIUM CHLORIDE CRYS ER 20 MEQ PO TBCR
60.0000 meq | EXTENDED_RELEASE_TABLET | Freq: Two times a day (BID) | ORAL | Status: DC
  Administered 2019-06-05 – 2019-06-06 (×3): 60 meq via ORAL
  Filled 2019-06-05 (×3): qty 3

## 2019-06-05 NOTE — Progress Notes (Signed)
Wean off oxygen as MD ordered; patient is on room air. Alert and oriented x3; spO2 = 94%.

## 2019-06-05 NOTE — Progress Notes (Addendum)
PROGRESS NOTE    Jared Hall  KYH:[id number] DOB: 25-Jul-1931 DOA: 06/02/2019 PCP: Josetta Huddle, MD    Brief Narrative:  85 y.o.male,w Anemia, Thallassemia, hypertension, hyperlipidemia, Dm2, h/o Stroke, Pafib, CHF (EF 55%), OSA, Bph/ prostate cancer s/p XRT apparently present with c/o nausea as well as abdominal discomfort "cramping". Pt denies fever, chills, cough, cp, palp, constipation, diarrhea, brbpr, black stool.   Assessment & Plan:   Principal Problem:   Hypokalemia Active Problems:   DM (diabetes mellitus) ([place])   History of CVA (cerebrovascular accident)   Essential hypertension, benign   GERD (gastroesophageal reflux disease)   DM type 2 with diabetic peripheral neuropathy (HCC)   Paroxysmal atrial fibrillation (HCC)   Chronic kidney disease, stage III (moderate) (HCC)   Anemia  Hypokalemia likely secondary to diuretics (lasix, metolazone)     - Consider endocrinology referral as outpatient when stable to r/o Conns syndrome, Cushings, or CAH      - MG2+ was ok, monitor     - Potassium remains low. Increased KCl to 47mEq     - Repeat bmet in AM  Pafib , chronic diastolic CHF (EF 15%)     - Continue with Aspirin/Plavix     - metoprolol 25mg  po bid     - Continued with Lasix 80mg  po bid     - Presently holding Metolazone due to hypokalemia     - currently euvolemic  DM2 w CKD stage3     - Cont Lantus 50 units [place] qday     - Cont novlog 5 units [place] tid ac meals     - continue SSI coverage as tolerated     - stable at this time  Diabetic neuropathy, Chronic pain     - Cont Tramadol 50mg  po q6h prn, Flexeril 5mg  po bid prn    -  Currently stable  Hyperlipidemia, h/o CVA     - Simvastatin 20mg  po qhs     - Cont aspirin, cont on plavix as tolerated    -  Currently stable  Gerd     - Continue with protonix as tolerated   BPH     - Cont Terazosin 4mg  po qhs     - Seems better at this time  Constipation     - Now good result with soap suds enema      - Pt had been with very limited PO intake     - Encourage PO as tolerated  Anemia, Thallasemia     - Hgb 10.3; MCV 72  DVT prophylaxis: Lovenox subQ Code Status: DNR Family Communication: Pt in room, family not at bedside Disposition Plan: return to SNF - PT/OT consulted  Consultants:     Procedures:     Antimicrobials: Anti-infectives (From admission, onward)   None      Subjective: Complaining of feeling constipated  Objective: Vitals:   06/05/19 0550 06/05/19 0800 06/05/19 1416 06/05/19 1700  BP: 129/65  (!) 146/76   Pulse: 75  96   Resp: 18  20   Temp: 97.6 F (36.4 C)  98.6 F (37 C)   TempSrc: Oral  Oral   SpO2: 100% 94% 92% 92%  Weight: 109 kg     Height:        Intake/Output Summary (Last 24 hours) at 06/05/2019 1751 Last data filed at 06/05/2019 1300 Gross per 24 hour  Intake 483 ml  Output -  Net 483 ml   Filed Weights   06/03/19 0500 06/05/19  0550  Weight: 112.9 kg 109 kg    Examination: General exam: Awake, laying in bed, in nad Respiratory system: Normal respiratory effort, no wheezing Cardiovascular system: regular rate, s1, s2 Gastrointestinal system: Generally tender, decreased BS Central nervous system: CN2-12 grossly intact, strength intact Extremities: Perfused, no clubbing Skin: Normal skin turgor, no notable skin lesions seen Psychiatry: Mood normal // no visual hallucinations   Data Reviewed: I have personally reviewed following labs and imaging studies  CBC: Recent Labs  Lab 06/02/19 1608 06/03/19 0847 06/04/19 0549  WBC 10.2 9.2 9.3  NEUTROABS  --   --  6.1  HGB 11.2* 10.3* 10.5*  HCT 37.0* 34.7* 36.6*  MCV 71.0* 72.1* 73.2*  PLT 267 237 481   Basic Metabolic Panel: Recent Labs  Lab 06/02/19 1608 06/02/19 2145 06/03/19 0640 06/03/19 0847 06/04/19 0549 06/05/19 0500  NA 140  --  QUESTIONABLE RESULTS, RECOMMEND RECOLLECT TO VERIFY 140 143 140  K 2.3*  --  QUESTIONABLE RESULTS, RECOMMEND RECOLLECT TO  VERIFY 2.8* 3.1* 3.2*  CL 91*  --  QUESTIONABLE RESULTS, RECOMMEND RECOLLECT TO VERIFY 96* 94* 93*  CO2 33*  --  QUESTIONABLE RESULTS, RECOMMEND RECOLLECT TO VERIFY 31 37* 36*  GLUCOSE 123*  --  QUESTIONABLE RESULTS, RECOMMEND RECOLLECT TO VERIFY 124* 94 56*  BUN 29*  --  QUESTIONABLE RESULTS, RECOMMEND RECOLLECT TO VERIFY 21 18 18   CREATININE 1.88*  --  0.85 1.51* 1.44* 1.46*  CALCIUM 9.2  --  QUESTIONABLE RESULTS, RECOMMEND RECOLLECT TO VERIFY 8.5* 9.4 9.4  MG  --  1.7  --   --  1.7  --   PHOS  --   --   --   --  2.3*  --    GFR: Estimated Creatinine Clearance: 44.9 mL/min (A) (by C-G formula based on SCr of 1.46 mg/dL (H)). Liver Function Tests: Recent Labs  Lab 06/02/19 1608 06/03/19 0640 06/03/19 0847 06/04/19 0549  AST 25 QUESTIONABLE RESULTS, RECOMMEND RECOLLECT TO VERIFY 23  --   ALT 19 QUESTIONABLE RESULTS, RECOMMEND RECOLLECT TO VERIFY 16  --   ALKPHOS 74 QUESTIONABLE RESULTS, RECOMMEND RECOLLECT TO VERIFY 64  --   BILITOT 0.8 QUESTIONABLE RESULTS, RECOMMEND RECOLLECT TO VERIFY 0.9  --   PROT 7.3 QUESTIONABLE RESULTS, RECOMMEND RECOLLECT TO VERIFY 6.6  --   ALBUMIN 3.9 QUESTIONABLE RESULTS, RECOMMEND RECOLLECT TO VERIFY 3.4* 3.5   Recent Labs  Lab 06/02/19 1608  LIPASE 29   No results for input(s): AMMONIA in the last 168 hours. Coagulation Profile: No results for input(s): INR, PROTIME in the last 168 hours. Cardiac Enzymes: No results for input(s): CKTOTAL, CKMB, CKMBINDEX, TROPONINI in the last 168 hours. BNP (last 3 results) No results for input(s): PROBNP in the last 8760 hours. HbA1C: No results for input(s): HGBA1C in the last 72 hours. CBG: Recent Labs  Lab 06/04/19 1633 06/04/19 2137 06/05/19 0812 06/05/19 1110 06/05/19 1619  GLUCAP 115* 170* 76 124* 109*   Lipid Profile: No results for input(s): CHOL, HDL, LDLCALC, TRIG, CHOLHDL, LDLDIRECT in the last 72 hours. Thyroid Function Tests: No results for input(s): TSH, T4TOTAL, FREET4, T3FREE,  THYROIDAB in the last 72 hours. Anemia Panel: No results for input(s): VITAMINB12, FOLATE, FERRITIN, TIBC, IRON, RETICCTPCT in the last 72 hours. Sepsis Labs: No results for input(s): PROCALCITON, LATICACIDVEN in the last 168 hours.  Recent Results (from the past 240 hour(s))  SARS Coronavirus 2 (CEPHEID - Performed in [place] hospital lab), Hosp Order     Status: None  Collection Time: 06/02/19  8:17 PM   Specimen: Nasopharyngeal Swab  Result Value Ref Range Status   SARS Coronavirus 2 NEGATIVE NEGATIVE Final    Comment: (NOTE) If result is NEGATIVE SARS-CoV-2 target nucleic acids are NOT DETECTED. The SARS-CoV-2 RNA is generally detectable in upper and lower  respiratory specimens during the acute phase of infection. The lowest  concentration of SARS-CoV-2 viral copies this assay can detect is 250  copies / mL. A negative result does not preclude SARS-CoV-2 infection  and should not be used as the sole basis for treatment or other  patient management decisions.  A negative result may occur with  improper specimen collection / handling, submission of specimen other  than nasopharyngeal swab, presence of viral mutation(s) within the  areas targeted by this assay, and inadequate number of viral copies  (<250 copies / mL). A negative result must be combined with clinical  observations, patient history, and epidemiological information. If result is POSITIVE SARS-CoV-2 target nucleic acids are DETECTED. The SARS-CoV-2 RNA is generally detectable in upper and lower  respiratory specimens dur ing the acute phase of infection.  Positive  results are indicative of active infection with SARS-CoV-2.  Clinical  correlation with patient history and other diagnostic information is  necessary to determine patient infection status.  Positive results do  not rule out bacterial infection or co-infection with other viruses. If result is PRESUMPTIVE POSTIVE SARS-CoV-2 nucleic acids MAY BE  PRESENT.   A presumptive positive result was obtained on the submitted specimen  and confirmed on repeat testing.  While 2019 novel coronavirus  (SARS-CoV-2) nucleic acids may be present in the submitted sample  additional confirmatory testing may be necessary for epidemiological  and / or clinical management purposes  to differentiate between  SARS-CoV-2 and other Sarbecovirus currently known to infect humans.  If clinically indicated additional testing with an alternate test  methodology [phone]) is advised. The SARS-CoV-2 RNA is generally  detectable in upper and lower respiratory sp ecimens during the acute  phase of infection. The expected result is Negative. Fact Sheet for Patients:  StrictlyIdeas.no Fact Sheet for Healthcare Providers: BankingDealers.co.za This test is not yet approved or cleared by the [place] FDA and has been authorized for detection and/or diagnosis of SARS-CoV-2 by FDA under an Emergency Use Authorization (EUA).  This EUA will remain in effect (meaning this test can be used) for the duration of the COVID-19 declaration under Section 564(b)(1) of the Act, 21 U.S.C. section 360bbb-3(b)(1), unless the authorization is terminated or revoked sooner. Performed at [facility], [place] [address] [place],  [address]   MRSA PCR Screening     Status: Abnormal   Collection Time: 06/03/19  6:16 PM   Specimen: Nasopharyngeal  Result Value Ref Range Status   MRSA by PCR POSITIVE (A) NEGATIVE Final    Comment:        The GeneXpert MRSA Assay (FDA approved for NASAL specimens only), is one component of a comprehensive MRSA colonization surveillance program. It is not intended to diagnose MRSA infection nor to guide or monitor treatment for MRSA infections. RESULT CALLED TO, READ BACK BY AND VERIFIED WITHMargarita Grizzle RN [id number] 06/04/19 A NAVARRO Performed at [facility],  [place] [address], [place],  [address]      Radiology Studies: No results found.  Scheduled Meds: . amitriptyline  10 mg Oral QHS  . aspirin EC  81 mg Oral Daily  . Chlorhexidine Gluconate Cloth  6 each Topical Daily  . cholecalciferol  2,000 Units Oral Daily  . clopidogrel  75 mg Oral Daily  . enoxaparin (LOVENOX) injection  0.5 mg/kg Subcutaneous QHS  . furosemide  80 mg Oral BID  . insulin aspart  0-5 Units Subcutaneous QHS  . insulin aspart  0-9 Units Subcutaneous TID WC  . insulin aspart  5 Units Subcutaneous TID WC  . insulin glargine  50 Units Subcutaneous BID  . metoprolol tartrate  25 mg Oral BID  . mupirocin ointment   Nasal BID  . pantoprazole  40 mg Oral Daily  . potassium chloride SA  60 mEq Oral BID  . senna-docusate  1 tablet Oral BID  . simvastatin  20 mg Oral Daily  . sodium chloride flush  3 mL Intravenous Q12H  . terazosin  4 mg Oral Daily  . vitamin B-12  1,000 mcg Oral Daily   Continuous Infusions: . sodium chloride       LOS: 2 days   Marylu Lund, MD [facility] Pager On Amion  If 7PM-7AM, please contact night-coverage 06/05/2019, 5:51 PM

## 2019-06-06 LAB — GLUCOSE, CAPILLARY
Glucose-Capillary: 171 mg/dL — ABNORMAL HIGH (ref 70–99)
Glucose-Capillary: 133 mg/dL — ABNORMAL HIGH (ref 70–99)
Glucose-Capillary: 102 mg/dL — ABNORMAL HIGH (ref 70–99)
Glucose-Capillary: 170 mg/dL — ABNORMAL HIGH (ref 70–99)

## 2019-06-06 LAB — COMPREHENSIVE METABOLIC PANEL
ALT: 15 U/L (ref 0–44)
AST: 27 U/L (ref 15–41)
Albumin: 3.2 g/dL — ABNORMAL LOW (ref 3.5–5.0)
Alkaline Phosphatase: 63 U/L (ref 38–126)
Anion gap: 14 (ref 5–15)
BUN: 21 mg/dL (ref 8–23)
CO2: 31 mmol/L (ref 22–32)
Calcium: 9.7 mg/dL (ref 8.9–10.3)
Chloride: 96 mmol/L — ABNORMAL LOW (ref 98–111)
Creatinine, Ser: 1.49 mg/dL — ABNORMAL HIGH (ref 0.61–1.24)
GFR calc Af Amer: 49 mL/min — ABNORMAL LOW (ref >60)
GFR calc non Af Amer: 42 mL/min — ABNORMAL LOW (ref >60)
Glucose, Bld: 179 mg/dL — ABNORMAL HIGH (ref 70–99)
Potassium: 4 mmol/L (ref 3.5–5.1)
Sodium: 141 mmol/L (ref 135–145)
Total Bilirubin: 0.6 mg/dL (ref 0.3–1.2)
Total Protein: 7.2 g/dL (ref 6.5–8.1)

## 2019-06-06 MED ORDER — FUROSEMIDE 40 MG PO TABS: 80.0000 mg | ORAL_TABLET | Freq: Two times a day (BID) | ORAL | Status: DC

## 2019-06-06 MED ORDER — POTASSIUM CHLORIDE CRYS ER 20 MEQ PO TBCR
40.0000 meq | EXTENDED_RELEASE_TABLET | Freq: Two times a day (BID) | ORAL | Status: DC
  Administered 2019-06-07 (×2): 40 meq via ORAL
  Filled 2019-06-06 (×2): qty 2

## 2019-06-06 NOTE — Evaluation (Addendum)
Physical Therapy Evaluation Patient Details Name: Jared Hall MRN: [id number] DOB: 08-19-1931 Today's Date: 06/06/2019   History of Present Illness  85 y.o. male, w Anemia, Thallassemia, hypertension, hyperlipidemia, Dm2, h/o Stroke, Pafib, CHF (EF 55%),  OSA, Bph/ prostate cancer s/p XRT apparently present with c/o nausea as well as abdominal discomfort "cramping".  Clinical Impression  Pt admitted with above diagnosis. Pt currently with functional limitations due to the deficits listed below (see PT Problem List). Pt will benefit from skilled PT to increase their independence and safety with mobility to allow discharge to the venue listed below.  Pt lives at [facility], but recommend SNF, unless they can provide level of A needed for transfers.  He needed MAX of 2 to transfer to recliner, although he may do better in his home environment though.     Follow Up Recommendations SNF;Supervision for mobility/OOB    Equipment Recommendations  None recommended by PT    Recommendations for Other Services       Precautions / Restrictions Precautions Precautions: Fall Restrictions Weight Bearing Restrictions: No      Mobility  Bed Mobility Overal bed mobility: Needs Assistance Bed Mobility: Supine to Sit     Supine to sit: Min assist;HOB elevated     General bed mobility comments: Pt able to initiate transfer and needed MIN A at the end to get hips fully turned.  Transfers Overall transfer level: Needs assistance   Transfers: Squat Pivot Transfers     Squat pivot transfers: Max assist;+2 physical assistance     General transfer comment: Pt transferred to drop arm recliner towards the R with MAX of2.  Pt at times attempting to help with hand placement, but appears to have poor motor planning.  Ambulation/Gait             General Gait Details: n/a  Stairs            Wheelchair Mobility    Modified Rankin (Stroke Patients Only)       Balance Overall balance  assessment: Needs assistance Sitting-balance support: Feet supported Sitting balance-Leahy Scale: Fair                                       Pertinent Vitals/Pain Pain Assessment: Faces Faces Pain Scale: Hurts even more Pain Location: abdomen Pain Descriptors / Indicators: Grimacing Pain Intervention(s): Limited activity within patient's tolerance;Monitored during session;Patient requesting pain meds-RN notified    Home Living Family/patient expects to be discharged to:: Assisted living                 Additional Comments: [facility]    Prior Function Level of Independence: Needs assistance   Gait / Transfers Assistance Needed: Per notes from 2019, pt transfered self to w/c I'ly and would self propel to dining room. Non-ambulatory x 6 years  ADL's / Homemaking Assistance Needed: Per notes from 2019, dresses self, assist to bathe  Comments: Pt unable to answer questions regarding PLOF     Hand Dominance        Extremity/Trunk Assessment   Upper Extremity Assessment Upper Extremity Assessment: Defer to OT evaluation    Lower Extremity Assessment Lower Extremity Assessment: Generalized weakness       Communication   Communication: Expressive difficulties;HOH  Cognition Arousal/Alertness: Awake/alert   Overall Cognitive Status: Difficult to assess  General Comments: When asked where he was, he stated his name.  Could not answer most questions, but did answer yes when asked if it felt good to be in the chair.  Several times asked if he was dying.      General Comments General comments (skin integrity, edema, etc.): Nursing aware to use lift to get patient back to bed.    Exercises     Assessment/Plan    PT Assessment Patient needs continued PT services  PT Problem List Decreased strength;Decreased activity tolerance;Decreased balance;Decreased mobility;Decreased coordination;Decreased  cognition       PT Treatment Interventions DME instruction;Functional mobility training;Therapeutic activities;Therapeutic exercise;Balance training;Neuromuscular re-education    PT Goals (Current goals can be found in the Care Plan section)  Acute Rehab PT Goals Patient Stated Goal: Pt was agreeable to sit up in chair Time For Goal Achievement: 06/20/19 Potential to Achieve Goals: Fair    Frequency Min 2X/week   Barriers to discharge        Co-evaluation PT/OT/SLP Co-Evaluation/Treatment: Yes Reason for Co-Treatment: Complexity of the patient's impairments (multi-system involvement);For patient/therapist safety PT goals addressed during session: Mobility/safety with mobility         AM-PAC PT "6 Clicks" Mobility  Outcome Measure Help needed turning from your back to your side while in a flat bed without using bedrails?: A Lot Help needed moving from lying on your back to sitting on the side of a flat bed without using bedrails?: A Lot Help needed moving to and from a bed to a chair (including a wheelchair)?: A Lot Help needed standing up from a chair using your arms (e.g., wheelchair or bedside chair)?: A Lot Help needed to walk in hospital room?: Total Help needed climbing 3-5 steps with a railing? : Total 6 Click Score: 10    End of Session Equipment Utilized During Treatment: Gait belt;Oxygen Activity Tolerance: Patient limited by fatigue Patient left: in chair;with call bell/phone within reach;with chair alarm set Nurse Communication: Mobility status;Need for lift equipment PT Visit Diagnosis: Muscle weakness (generalized) (M62.81);Other abnormalities of gait and mobility (R26.89)    Time: 6812-7517 PT Time Calculation (min) (ACUTE ONLY): 43 min   Charges:   PT Evaluation $PT Eval Moderate Complexity: 1 Mod PT Treatments $Therapeutic Activity: 8-22 mins        Kolbe Delmonaco L. Tamala Julian, [place] Pager [phone] 06/06/2019   Galen Manila 06/06/2019, 10:37 AM

## 2019-06-06 NOTE — Care Management Important Message (Signed)
Important Message  Patient Details IM Letter given to Cookie McGibboney RN to present to the Patient Name: Jared Hall MRN: [id number] Date of Birth: October 18, 1931   Medicare Important Message Given:  Yes     Kerin Salen 06/06/2019, 10:22 AM

## 2019-06-06 NOTE — Progress Notes (Signed)
PROGRESS NOTE    Jared Hall  DTO:[id number] DOB: August 12, 1931 DOA: 06/02/2019 PCP: Josetta Huddle, MD    Brief Narrative:  85 y.o.male,w Anemia, Thallassemia, hypertension, hyperlipidemia, Dm2, h/o Stroke, Pafib, CHF (EF 55%), OSA, Bph/ prostate cancer s/p XRT apparently present with c/o nausea as well as abdominal discomfort "cramping". Pt denies fever, chills, cough, cp, palp, constipation, diarrhea, brbpr, black stool.   Assessment & Plan:   Principal Problem:   Hypokalemia Active Problems:   DM (diabetes mellitus) ([place])   History of CVA (cerebrovascular accident)   Essential hypertension, benign   GERD (gastroesophageal reflux disease)   DM type 2 with diabetic peripheral neuropathy (HCC)   Paroxysmal atrial fibrillation (HCC)   Chronic kidney disease, stage III (moderate) (HCC)   Anemia  Hypokalemia likely secondary to diuretics (lasix, metolazone)     - Consider endocrinology referral as outpatient when stable to r/o Conns syndrome, Cushings, or CAH      - MG2+ was ok, monitor     - Potassium corrected     - Will recheck lytes in AM  Pafib , chronic diastolic CHF (EF 98%)     - Continue with Aspirin/Plavix     - metoprolol 25mg  po bid     - Continued with Lasix 80mg  po bid     - Presently holding Metolazone due to hypokalemia     - Presently at this AM  DM2 w CKD stage3     - Cont Lantus 50 units [place] qday     - Cont novlog 5 units [place] tid ac meals     - continue SSI coverage as tolerated     - Presently stable  Diabetic neuropathy, Chronic pain     - Cont Tramadol 50mg  po q6h prn, Flexeril 5mg  po bid prn    -  Presently stable  Hyperlipidemia, h/o CVA     - Simvastatin 20mg  po qhs     - Cont aspirin, cont on plavix as tolerated    -  Currently stable  Gerd     - Continue with protonix as tolerated   BPH     - Cont Terazosin 4mg  po qhs     - Presently stable  Constipation     - Now good result with soap suds enema     - Pt had been with very  limited PO intake     - Continue to encourage PO as tolerated  Anemia, Thallasemia     - Hgb 10.3; MCV 72  DVT prophylaxis: Lovenox subQ Code Status: DNR Family Communication: Pt in room, family not at bedside Disposition Plan: return to SNF, likely in 3-4 days given bed availability  Consultants:     Procedures:     Antimicrobials: Anti-infectives (From admission, onward)   None      Subjective: Comlaining of "butt pain" while sitting in chair  Objective: Vitals:   06/05/19 1700 06/05/19 1957 06/06/19 0437 06/06/19 1303  BP:  137/79 (!) 145/78 110/86  Pulse:  (!) 105 99 93  Resp:  20 20 20   Temp:  98.4 F (36.9 C) 98.1 F (36.7 C) 98.8 F (37.1 C)  TempSrc:  Oral Oral Oral  SpO2: 92% 91% 100% 100%  Weight:      Height:        Intake/Output Summary (Last 24 hours) at 06/06/2019 1630 Last data filed at 06/06/2019 1500 Gross per 24 hour  Intake 1200 ml  Output 3 ml  Net 1197 ml  Filed Weights   06/03/19 0500 06/05/19 0550  Weight: 112.9 kg 109 kg    Examination: General exam: Awake, sitting in chair, in nad Respiratory system: Normal respiratory effort, no wheezing Cardiovascular system: regular rate, s1, s2 Gastrointestinal system: Soft, nondistended, positive BS Central nervous system: CN2-12 grossly intact, strength intact Extremities: Perfused, no clubbing Skin: Normal skin turgor, no notable skin lesions seen Psychiatry: Mood normal // no visual hallucinations   Data Reviewed: I have personally reviewed following labs and imaging studies  CBC: Recent Labs  Lab 06/02/19 1608 06/03/19 0847 06/04/19 0549  WBC 10.2 9.2 9.3  NEUTROABS  --   --  6.1  HGB 11.2* 10.3* 10.5*  HCT 37.0* 34.7* 36.6*  MCV 71.0* 72.1* 73.2*  PLT 267 237 841   Basic Metabolic Panel: Recent Labs  Lab 06/02/19 2145 06/03/19 0640 06/03/19 0847 06/04/19 0549 06/05/19 0500 06/06/19 0539  NA  --  QUESTIONABLE RESULTS, RECOMMEND RECOLLECT TO VERIFY 140 143 140  141  K  --  QUESTIONABLE RESULTS, RECOMMEND RECOLLECT TO VERIFY 2.8* 3.1* 3.2* 4.0  CL  --  QUESTIONABLE RESULTS, RECOMMEND RECOLLECT TO VERIFY 96* 94* 93* 96*  CO2  --  QUESTIONABLE RESULTS, RECOMMEND RECOLLECT TO VERIFY 31 37* 36* 31  GLUCOSE  --  QUESTIONABLE RESULTS, RECOMMEND RECOLLECT TO VERIFY 124* 94 56* 179*  BUN  --  QUESTIONABLE RESULTS, RECOMMEND RECOLLECT TO VERIFY 21 18 18 21   CREATININE  --  0.85 1.51* 1.44* 1.46* 1.49*  CALCIUM  --  QUESTIONABLE RESULTS, RECOMMEND RECOLLECT TO VERIFY 8.5* 9.4 9.4 9.7  MG 1.7  --   --  1.7  --   --   PHOS  --   --   --  2.3*  --   --    GFR: Estimated Creatinine Clearance: 44 mL/min (A) (by C-G formula based on SCr of 1.49 mg/dL (H)). Liver Function Tests: Recent Labs  Lab 06/02/19 1608 06/03/19 0640 06/03/19 0847 06/04/19 0549 06/06/19 0539  AST 25 QUESTIONABLE RESULTS, RECOMMEND RECOLLECT TO VERIFY 23  --  27  ALT 19 QUESTIONABLE RESULTS, RECOMMEND RECOLLECT TO VERIFY 16  --  15  ALKPHOS 74 QUESTIONABLE RESULTS, RECOMMEND RECOLLECT TO VERIFY 64  --  63  BILITOT 0.8 QUESTIONABLE RESULTS, RECOMMEND RECOLLECT TO VERIFY 0.9  --  0.6  PROT 7.3 QUESTIONABLE RESULTS, RECOMMEND RECOLLECT TO VERIFY 6.6  --  7.2  ALBUMIN 3.9 QUESTIONABLE RESULTS, RECOMMEND RECOLLECT TO VERIFY 3.4* 3.5 3.2*   Recent Labs  Lab 06/02/19 1608  LIPASE 29   No results for input(s): AMMONIA in the last 168 hours. Coagulation Profile: No results for input(s): INR, PROTIME in the last 168 hours. Cardiac Enzymes: No results for input(s): CKTOTAL, CKMB, CKMBINDEX, TROPONINI in the last 168 hours. BNP (last 3 results) No results for input(s): PROBNP in the last 8760 hours. HbA1C: No results for input(s): HGBA1C in the last 72 hours. CBG: Recent Labs  Lab 06/05/19 1619 06/05/19 1959 06/06/19 0705 06/06/19 1105 06/06/19 1601  GLUCAP 109* 165* 171* 133* 102*   Lipid Profile: No results for input(s): CHOL, HDL, LDLCALC, TRIG, CHOLHDL, LDLDIRECT in the last  72 hours. Thyroid Function Tests: No results for input(s): TSH, T4TOTAL, FREET4, T3FREE, THYROIDAB in the last 72 hours. Anemia Panel: No results for input(s): VITAMINB12, FOLATE, FERRITIN, TIBC, IRON, RETICCTPCT in the last 72 hours. Sepsis Labs: No results for input(s): PROCALCITON, LATICACIDVEN in the last 168 hours.  Recent Results (from the past 240 hour(s))  SARS Coronavirus 2 (CEPHEID -  Performed in [facility] hospital lab), Hosp Order     Status: None   Collection Time: 06/02/19  8:17 PM   Specimen: Nasopharyngeal Swab  Result Value Ref Range Status   SARS Coronavirus 2 NEGATIVE NEGATIVE Final    Comment: (NOTE) If result is NEGATIVE SARS-CoV-2 target nucleic acids are NOT DETECTED. The SARS-CoV-2 RNA is generally detectable in upper and lower  respiratory specimens during the acute phase of infection. The lowest  concentration of SARS-CoV-2 viral copies this assay can detect is 250  copies / mL. A negative result does not preclude SARS-CoV-2 infection  and should not be used as the sole basis for treatment or other  patient management decisions.  A negative result may occur with  improper specimen collection / handling, submission of specimen other  than nasopharyngeal swab, presence of viral mutation(s) within the  areas targeted by this assay, and inadequate number of viral copies  (<250 copies / mL). A negative result must be combined with clinical  observations, patient history, and epidemiological information. If result is POSITIVE SARS-CoV-2 target nucleic acids are DETECTED. The SARS-CoV-2 RNA is generally detectable in upper and lower  respiratory specimens dur ing the acute phase of infection.  Positive  results are indicative of active infection with SARS-CoV-2.  Clinical  correlation with patient history and other diagnostic information is  necessary to determine patient infection status.  Positive results do  not rule out bacterial infection or co-infection  with other viruses. If result is PRESUMPTIVE POSTIVE SARS-CoV-2 nucleic acids MAY BE PRESENT.   A presumptive positive result was obtained on the submitted specimen  and confirmed on repeat testing.  While 2019 novel coronavirus  (SARS-CoV-2) nucleic acids may be present in the submitted sample  additional confirmatory testing may be necessary for epidemiological  and / or clinical management purposes  to differentiate between  SARS-CoV-2 and other Sarbecovirus currently known to infect humans.  If clinically indicated additional testing with an alternate test  methodology [phone]) is advised. The SARS-CoV-2 RNA is generally  detectable in upper and lower respiratory sp ecimens during the acute  phase of infection. The expected result is Negative. Fact Sheet for Patients:  StrictlyIdeas.no Fact Sheet for Healthcare Providers: BankingDealers.co.za This test is not yet approved or cleared by the [place] FDA and has been authorized for detection and/or diagnosis of SARS-CoV-2 by FDA under an Emergency Use Authorization (EUA).  This EUA will remain in effect (meaning this test can be used) for the duration of the COVID-19 declaration under Section 564(b)(1) of the Act, 21 U.S.C. section 360bbb-3(b)(1), unless the authorization is terminated or revoked sooner. Performed at [facility], [place] [address], [place], [place] [address]   MRSA PCR Screening     Status: Abnormal   Collection Time: 06/03/19  6:16 PM   Specimen: Nasopharyngeal  Result Value Ref Range Status   MRSA by PCR POSITIVE (A) NEGATIVE Final    Comment:        The GeneXpert MRSA Assay (FDA approved for NASAL specimens only), is one component of a comprehensive MRSA colonization surveillance program. It is not intended to diagnose MRSA infection nor to guide or monitor treatment for MRSA infections. RESULT CALLED TO, READ BACK BY AND VERIFIED  WITHMargarita Grizzle RN [id number] 06/04/19 A NAVARRO Performed at [facility], [place] [address], [place], [place] [address]      Radiology Studies: No results found.  Scheduled Meds: . amitriptyline  10 mg Oral  QHS  . aspirin EC  81 mg Oral Daily  . Chlorhexidine Gluconate Cloth  6 each Topical Daily  . cholecalciferol  2,000 Units Oral Daily  . clopidogrel  75 mg Oral Daily  . enoxaparin (LOVENOX) injection  0.5 mg/kg Subcutaneous QHS  . [START ON 06/07/2019] furosemide  80 mg Oral BID  . insulin aspart  0-5 Units Subcutaneous QHS  . insulin aspart  0-9 Units Subcutaneous TID WC  . insulin aspart  5 Units Subcutaneous TID WC  . insulin glargine  50 Units Subcutaneous BID  . metoprolol tartrate  25 mg Oral BID  . mupirocin ointment   Nasal BID  . pantoprazole  40 mg Oral Daily  . potassium chloride SA  60 mEq Oral BID  . senna-docusate  1 tablet Oral BID  . simvastatin  20 mg Oral Daily  . sodium chloride flush  3 mL Intravenous Q12H  . terazosin  4 mg Oral Daily  . vitamin B-12  1,000 mcg Oral Daily   Continuous Infusions: . sodium chloride       LOS: 3 days   Marylu Lund, MD [facility] Pager On Amion  If 7PM-7AM, please contact night-coverage 06/06/2019, 4:30 PM

## 2019-06-06 NOTE — NC FL2 (Signed)
[place] MEDICAID FL2 LEVEL OF CARE SCREENING TOOL     IDENTIFICATION  Patient Name: Jared Hall Birthdate: [birth year] Sex: male Admission Date (Current Location): 06/02/2019  [facility] and [place] Number:  Herbalist and Address:  [facility],  [place], [place]      Provider Number: [id number]  Attending Physician Name and Address:  Donne Hazel, MD  Relative Name and Phone Number:  [phone] son Jared Hall    Current Level of Care: Hospital Recommended Level of Care: [place] Prior Approval Number:    Date Approved/Denied:   PASRR Number: [id number]  Discharge Plan: SNF    Current Diagnoses: Patient Active Problem List   Diagnosis Date Noted  . Hypokalemia 06/02/2019  . TIA (transient ischemic attack) 05/22/2018  . Cellulitis in diabetic foot ([place]) 05/22/2018  . Tremor 10/17/2017  . Acute encephalopathy   . Acute ischemic stroke ([place])   . Generalized weakness   . Cerebrovascular disease   . Altered mental status   . Acute respiratory failure with hypercapnia ([place])   . CVA (cerebral vascular accident) ([place]) 01/01/2017  . Dehydration 01/01/2017  . Anemia 01/01/2017  . Acute respiratory failure ([place]) 12/15/2016  . Morbid obesity ([place]) 12/15/2016  . Hard of hearing 12/15/2016  . Chronic kidney disease, stage III (moderate) ([place]) 12/06/2016  . Bell's palsy   . Facial droop   . Paroxysmal atrial fibrillation (HCC)   . Embolic stroke ([place])   . Dysphagia 03/14/2015  . Dysarthria 03/14/2015  . Stroke ([place]) 03/14/2015  . Cellulitis 01/11/2014  . Bilateral lower extremity edema 01/11/2014  . Venous stasis dermatitis 01/11/2014  . DM type 2 with diabetic peripheral neuropathy ([place]) 01/11/2014  . Cellulitis and abscess of leg 01/11/2014  . Cellulitis and abscess 01/11/2014  . Depression 06/15/2013  . GERD (gastroesophageal reflux disease) 06/15/2013  . Constipation 06/15/2013  . Diabetic neuropathy  ([place]) 06/15/2013  . Type 1 diabetes mellitus with peripheral circulatory complications ([place]) 76/19/5093  . Essential hypertension, benign 04/07/2013  . AKI (acute kidney injury) ([place]) 04/04/2013  . Bilateral lower leg cellulitis 04/04/2013  . Right hip pain 04/04/2013  . Renal insufficiency 12/16/2011  . History of CVA (cerebrovascular accident) 12/16/2011  . Dyslipidemia 12/16/2011  . Obesity (BMI 30-39.9) 12/16/2011  . Umbilical hernia with obstruction-partial on CT scan; easily reducible 12/14/2011  . History of PSVT (paroxysmal supraventricular tachycardia) 12/14/2011  . DM (diabetes mellitus) ([place]) 12/14/2011    Orientation RESPIRATION BLADDER Height & Weight     Self  O2 Incontinent Weight: 240 lb 4.8 oz (109 kg) Height:  5\' 10"  (177.8 cm)  BEHAVIORAL SYMPTOMS/MOOD NEUROLOGICAL BOWEL NUTRITION STATUS      Incontinent Diet(carb modified, heart healthy)  AMBULATORY STATUS COMMUNICATION OF NEEDS Skin   Extensive Assist Verbally Normal                       Personal Care Assistance Level of Assistance  Bathing, Feeding, Dressing Bathing Assistance: Maximum assistance Feeding assistance: Independent Dressing Assistance: Maximum assistance     Functional Limitations Info  Sight, Hearing, Speech Sight Info: Adequate Hearing Info: Adequate Speech Info: Adequate    SPECIAL CARE FACTORS FREQUENCY  PT (By licensed PT), OT (By licensed OT)     PT Frequency: 5x OT Frequency: 5x            Contractures Contractures Info: Not present    Additional Factors Info  Code Status, Allergies Code Status Info:  DNR Allergies Info: Ativan Lorazepam, Flomax Tamsulosin Hcl, Glucophage Metformin Hcl           Current Medications (06/06/2019):  This is the current hospital active medication list Current Facility-Administered Medications  Medication Dose Route Frequency Provider Last Rate Last Dose  . 0.9 %  sodium chloride infusion  250 mL Intravenous PRN Jani Gravel, MD       . acetaminophen (TYLENOL) tablet 650 mg  650 mg Oral Q6H PRN Jani Gravel, MD       Or  . acetaminophen (TYLENOL) suppository 650 mg  650 mg Rectal Q6H PRN Jani Gravel, MD      . amitriptyline (ELAVIL) tablet 10 mg  10 mg Oral Loma Sousa, MD   10 mg at 06/05/19 2139  . aspirin EC tablet 81 mg  81 mg Oral Daily Jani Gravel, MD   81 mg at 06/06/19 0919  . Chlorhexidine Gluconate Cloth 2 % PADS 6 each  6 each Topical Daily Donne Hazel, MD   6 each at 06/06/19 1326  . cholecalciferol (VITAMIN D3) tablet 2,000 Units  2,000 Units Oral Daily Jani Gravel, MD   2,000 Units at 06/06/19 [id number]  . clopidogrel (PLAVIX) tablet 75 mg  75 mg Oral Daily Jani Gravel, MD   75 mg at 06/06/19 0921  . cyclobenzaprine (FLEXERIL) tablet 5 mg  5 mg Oral BID PRN Jani Gravel, MD   5 mg at 06/03/19 0139  . enoxaparin (LOVENOX) injection 55 mg  0.5 mg/kg Subcutaneous QHS Kyle, Tyrone A, DO   55 mg at 06/05/19 2139  . [START ON 06/07/2019] furosemide (LASIX) tablet 80 mg  80 mg Oral BID Donne Hazel, MD      . insulin aspart (novoLOG) injection 0-5 Units  0-5 Units Subcutaneous QHS Jani Gravel, MD      . insulin aspart (novoLOG) injection 0-9 Units  0-9 Units Subcutaneous TID WC Jani Gravel, MD   1 Units at 06/06/19 1328  . insulin aspart (novoLOG) injection 5 Units  5 Units Subcutaneous TID WC Jani Gravel, MD   5 Units at 06/06/19 1326  . insulin glargine (LANTUS) injection 50 Units  50 Units Subcutaneous BID Jani Gravel, MD   50 Units at 06/06/19 0920  . metoprolol tartrate (LOPRESSOR) tablet 25 mg  25 mg Oral BID Jani Gravel, MD   25 mg at 06/06/19 0919  . mupirocin ointment (BACTROBAN) 2 %   Nasal BID Donne Hazel, MD      . ondansetron [facility]) injection 4 mg  4 mg Intravenous Q6H PRN Jani Gravel, MD   4 mg at 06/05/19 1800  . pantoprazole (PROTONIX) EC tablet 40 mg  40 mg Oral Daily Jani Gravel, MD   40 mg at 06/06/19 0919  . polyvinyl alcohol (LIQUIFILM TEARS) 1.4 % ophthalmic solution 1 drop  1 drop Both Eyes TID PRN  Jani Gravel, MD      . potassium chloride SA (K-DUR) CR tablet 60 mEq  60 mEq Oral BID Donne Hazel, MD   60 mEq at 06/06/19 0919  . senna-docusate (Senokot-S) tablet 1 tablet  1 tablet Oral BID Jani Gravel, MD   1 tablet at 06/06/19 0919  . simvastatin (ZOCOR) tablet 20 mg  20 mg Oral Daily Jani Gravel, MD   20 mg at 06/06/19 0918  . sodium chloride flush (NS) 0.9 % injection 3 mL  3 mL Intravenous Q12H Jani Gravel, MD   3 mL at 06/05/19 2141  . sodium chloride  flush (NS) 0.9 % injection 3 mL  3 mL Intravenous PRN Jani Gravel, MD      . terazosin (HYTRIN) capsule 4 mg  4 mg Oral Daily Jani Gravel, MD   4 mg at 06/06/19 0919  . traMADol (ULTRAM) tablet 50 mg  50 mg Oral Q6H PRN Jani Gravel, MD   50 mg at 06/03/19 0139  . vitamin B-12 (CYANOCOBALAMIN) tablet 1,000 mcg  1,000 mcg Oral Daily Jani Gravel, MD   1,000 mcg at 06/06/19 0919     Discharge Medications: Please see discharge summary for a list of discharge medications.  Relevant Imaging Results:  Relevant Lab Results:   Additional Information TU#[id number]  Nila Nephew, LCSW

## 2019-06-06 NOTE — Evaluation (Signed)
Occupational Therapy Evaluation Patient Details Name: Jared Hall MRN: [id number] DOB: [birth year] Today's Date: 06/06/2019    History of Present Illness 85 y.o. male, w Anemia, Thallassemia, hypertension, hyperlipidemia, Dm2, h/o Stroke, Pafib, CHF (EF 55%),  OSA, Bph/ prostate cancer s/p XRT apparently present with c/o nausea as well as abdominal discomfort "cramping".   Clinical Impression   Pt was admitted for the above.  He is from [facility].  Pt needed max +2 for lateral transfer to drop arm recliner today.  Had difficulty getting feet under him in the correct place. He is having abdominal pain. Will follow in acute setting with the goals listed below.  Pt unable to provide PLOF information:  Information from 2019 in chart.     Follow Up Recommendations  SNF(vs back to [facility] if they can provide needed assist)    Equipment Recommendations  (? drop arm commode)    Recommendations for Other Services       Precautions / Restrictions Precautions Precautions: Fall Restrictions Weight Bearing Restrictions: No      Mobility Bed Mobility Overal bed mobility: Needs Assistance Bed Mobility: Supine to Sit     Supine to sit: Min assist;HOB elevated     General bed mobility comments: OOB by PT  Transfers Overall transfer level: Needs assistance   Transfers: (lateral scoot)     Squat pivot transfers: Max assist;+2 physical assistance     General transfer comment: Pt transferred to drop arm recliner towards the R with MAX of2.  Pt at times attempting to help with hand placement, but appears to have poor motor planning.    Balance Overall balance assessment: Needs assistance Sitting-balance support: Feet supported Sitting balance-Leahy Scale: Fair                                     ADL either performed or assessed with clinical judgement   ADL Overall ADL's : Needs assistance/impaired Eating/Feeding: Set up   Grooming: Set up                    Toilet Transfer: Maximal assistance;+2 for physical assistance(lateral transfer to drop arm recliner)             General ADL Comments: Pt with abdominal pain, based on clinical judgment, he can perform UB adls with set up to min A and LB adls with max to total A.  Pt states he doesn't stand--assisted with rolling in recliner to position lift pad.  UB adls from seated and LB from bed level     Vision         Perception     [facility]-Other Comments: ? motor planning when attempting to transfer. Pt's feet  not under him and he moved them further away rather than under him for weight bearing    Pertinent Vitals/Pain Pain Assessment: Faces Faces Pain Scale: Hurts even more Pain Location: abdomen Pain Descriptors / Indicators: Grimacing Pain Intervention(s): Limited activity within patient's tolerance;Monitored during session     Hand Dominance Right   Extremity/Trunk Assessment Upper Extremity Assessment Upper Extremity Assessment: Generalized weakness(abdominal pain)   Lower Extremity Assessment Lower Extremity Assessment: Generalized weakness       Communication Communication Communication: Expressive difficulties;HOH(very dysarthric)   Cognition Arousal/Alertness: Awake/alert Behavior During Therapy: WFL for tasks assessed/performed Overall Cognitive Status: Difficult to assess  General Comments: When asked where he was, he stated his name.  Could not answer most questions, but did answer yes when asked if it felt good to be in the chair.  Several times asked if he was dying.   General Comments  Nursing aware to use lift to get patient back to bed.    Exercises     Shoulder Instructions      Home Living Family/patient expects to be discharged to:: Assisted living                             Home Equipment: Walker - 4 wheels;Walker - 2 wheels;Wheelchair - manual    Additional Comments: [facility]      Prior Functioning/Environment Level of Independence: Needs assistance  Gait / Transfers Assistance Needed: Per notes from 2019, pt transfered self to w/c I'ly and would self propel to dining room. Non-ambulatory x 6 years ADL's / Homemaking Assistance Needed: Per notes from 2019, dresses self, assist to bathe   Comments: Pt unable to answer questions regarding PLOF        OT Problem List: Decreased strength;Decreased activity tolerance;Pain;Decreased knowledge of use of DME or AE      OT Treatment/Interventions: Self-care/ADL training;DME and/or AE instruction;Therapeutic exercise;Therapeutic activities;Patient/family education;Balance training    OT Goals(Current goals can be found in the care plan section) Acute Rehab OT Goals Patient Stated Goal: Pt was agreeable to sit up in chair OT Goal Formulation: With patient Time For Goal Achievement: 06/20/19 Potential to Achieve Goals: Good ADL Goals Pt Will Transfer to Toilet: with min assist;with +2 assist(lateral to drop arm commode) Additional ADL Goal #1: pt will roll to bil sides with min A for LB adls Additional ADL Goal #2: pt will perform hygiene with mod A, leaning side to side  OT Frequency: Min 2X/week   Barriers to D/C:            Co-evaluation PT/OT/SLP Co-Evaluation/Treatment: Yes Reason for Co-Treatment: Complexity of the patient's impairments (multi-system involvement) PT goals addressed during session: Mobility/safety with mobility OT goals addressed during session: ADL's and self-care      AM-PAC OT "6 Clicks" Daily Activity     Outcome Measure Help from another person eating meals?: None Help from another person taking care of personal grooming?: None Help from another person toileting, which includes using toliet, bedpan, or urinal?: Total Help from another person bathing (including washing, rinsing, drying)?: A Lot Help from another person to put on and taking  off regular upper body clothing?: A Little Help from another person to put on and taking off regular lower body clothing?: Total 6 Click Score: 15   End of Session Nurse Communication: Mobility status;Need for lift equipment(maximove pad under pt)  Activity Tolerance: Patient tolerated treatment well Patient left: in chair;with call bell/phone within reach;with chair alarm set  OT Visit Diagnosis: Muscle weakness (generalized) (M62.81)                Time: 0940-1000 OT Time Calculation (min): 20 min Charges:  OT General Charges $OT Visit: 1 Visit OT Evaluation $OT Eval Low Complexity: [place], OTR/L Acute Rehabilitation Services [phone] [facility] pager [phone] office 06/06/2019  [place] 06/06/2019, 11:11 AM

## 2019-06-07 LAB — BASIC METABOLIC PANEL
Anion gap: 12 (ref 5–15)
BUN: 35 mg/dL — ABNORMAL HIGH (ref 8–23)
CO2: 34 mmol/L — ABNORMAL HIGH (ref 22–32)
Calcium: 9.6 mg/dL (ref 8.9–10.3)
Chloride: 93 mmol/L — ABNORMAL LOW (ref 98–111)
Creatinine, Ser: 1.78 mg/dL — ABNORMAL HIGH (ref 0.61–1.24)
GFR calc Af Amer: 39 mL/min — ABNORMAL LOW (ref >60)
GFR calc non Af Amer: 34 mL/min — ABNORMAL LOW (ref >60)
Glucose, Bld: 175 mg/dL — ABNORMAL HIGH (ref 70–99)
Potassium: 3.6 mmol/L (ref 3.5–5.1)
Sodium: 139 mmol/L (ref 135–145)

## 2019-06-07 LAB — GLUCOSE, CAPILLARY
Glucose-Capillary: 174 mg/dL — ABNORMAL HIGH (ref 70–99)
Glucose-Capillary: 170 mg/dL — ABNORMAL HIGH (ref 70–99)
Glucose-Capillary: 167 mg/dL — ABNORMAL HIGH (ref 70–99)
Glucose-Capillary: 141 mg/dL — ABNORMAL HIGH (ref 70–99)
Glucose-Capillary: 146 mg/dL — ABNORMAL HIGH (ref 70–99)
Glucose-Capillary: 131 mg/dL — ABNORMAL HIGH (ref 70–99)

## 2019-06-07 MED ORDER — LACTATED RINGERS IV SOLN
INTRAVENOUS | Status: DC
  Administered 2019-06-07 – 2019-06-08 (×3): via INTRAVENOUS

## 2019-06-07 NOTE — Progress Notes (Signed)
PROGRESS NOTE    Jared Hall  BTD:[id number] DOB: 08-10-1931 DOA: 06/02/2019 PCP: Josetta Huddle, MD    Brief Narrative:  85 y.o.male,w Anemia, Thallassemia, hypertension, hyperlipidemia, Dm2, h/o Stroke, Pafib, CHF (EF 55%), OSA, Bph/ prostate cancer s/p XRT apparently present with c/o nausea as well as abdominal discomfort "cramping". Pt denies fever, chills, cough, cp, palp, constipation, diarrhea, brbpr, black stool.   Assessment & Plan:   Principal Problem:   Hypokalemia Active Problems:   DM (diabetes mellitus) ([place])   History of CVA (cerebrovascular accident)   Essential hypertension, benign   GERD (gastroesophageal reflux disease)   DM type 2 with diabetic peripheral neuropathy (HCC)   Paroxysmal atrial fibrillation (HCC)   Chronic kidney disease, stage III (moderate) (HCC)   Anemia  Hypokalemia likely secondary to diuretics (lasix, metolazone)     - Consider endocrinology referral as outpatient when stable to r/o Conns syndrome, Cushings, or CAH      - MG2+ was ok, monitor     - Potassium corrected     - Repeat lytes in AM  Pafib , chronic diastolic CHF (EF 10%)     - Continue with Aspirin/Plavix     - metoprolol 25mg  po bid     - Presently stable at this AM     - clinically dehydrated, thus hold further diuretic     - Will give gentle IVF hydration  DM2 w CKD stage3     - Cont Lantus 50 units [place] qday     - Cont novlog 5 units [place] tid ac meals     - continue SSI coverage as tolerated     - Presently stable  Diabetic neuropathy, Chronic pain     - Cont Tramadol 50mg  po q6h prn, Flexeril 5mg  po bid prn    -  Currently stable  Hyperlipidemia, h/o CVA     - Simvastatin 20mg  po qhs     - Cont aspirin, cont on plavix as tolerated    -  Presently stable stable  Gerd     - Continue with protonix as pt tolerates   BPH     - Cont Terazosin 4mg  po qhs     - Currently stable  Constipation     - Now good result with soap suds enema     - Pt had been  with very limited PO intake     - Continue to encourage PO as tolerated  Anemia, Thallasemia     - Hgb 10.3; MCV 72  ARF     - Cr up to 1.78 in setting of decreased PO intake     - Dry membranes on exam    - held diuretic per above and will give gentle IVF hydration     - Repeat bmet in AM  DVT prophylaxis: Lovenox subQ Code Status: DNR Family Communication: Pt in room, family not at bedside Disposition Plan: return to SNF, likely in 3-4 days given bed availability  Consultants:     Procedures:     Antimicrobials: Anti-infectives (From admission, onward)   None      Subjective: Without complaints at this time  Objective: Vitals:   06/06/19 1303 06/06/19 1922 06/07/19 0618 06/07/19 1332  BP: 110/86 (!) 119/51 (!) 141/82 (!) 169/66  Pulse: 93 94 100 85  Resp: 20 18 20 20   Temp: 98.8 F (37.1 C) 98.9 F (37.2 C) 99 F (37.2 C) 98.2 F (36.8 C)  TempSrc: Oral Oral Oral Oral  SpO2:  100% 99% 93% 100%  Weight:      Height:        Intake/Output Summary (Last 24 hours) at 06/07/2019 1631 Last data filed at 06/07/2019 1200 Gross per 24 hour  Intake 1200 ml  Output 4 ml  Net 1196 ml   Filed Weights   06/03/19 0500 06/05/19 0550  Weight: 112.9 kg 109 kg    Examination: General exam: asleep, arousable, in no acute distress Respiratory system: normal chest rise, clear, no audible wheezing Cardiovascular system: regular rhythm, s1-s2 Gastrointestinal system: Nondistended, nontender, pos BS Central nervous system: No seizures, no tremors Extremities: No cyanosis, no joint deformities Skin: No rashes, no pallor Psychiatry: difficult to assess given current level of mentation  Data Reviewed: I have personally reviewed following labs and imaging studies  CBC: Recent Labs  Lab 06/02/19 1608 06/03/19 0847 06/04/19 0549  WBC 10.2 9.2 9.3  NEUTROABS  --   --  6.1  HGB 11.2* 10.3* 10.5*  HCT 37.0* 34.7* 36.6*  MCV 71.0* 72.1* 73.2*  PLT 267 237 175    Basic Metabolic Panel: Recent Labs  Lab 06/02/19 2145  06/03/19 0847 06/04/19 0549 06/05/19 0500 06/06/19 0539 06/07/19 0659  NA  --    < > 140 143 140 141 139  K  --    < > 2.8* 3.1* 3.2* 4.0 3.6  CL  --    < > 96* 94* 93* 96* 93*  CO2  --    < > 31 37* 36* 31 34*  GLUCOSE  --    < > 124* 94 56* 179* 175*  BUN  --    < > 21 18 18 21  35*  CREATININE  --    < > 1.51* 1.44* 1.46* 1.49* 1.78*  CALCIUM  --    < > 8.5* 9.4 9.4 9.7 9.6  MG 1.7  --   --  1.7  --   --   --   PHOS  --   --   --  2.3*  --   --   --    < > = values in this interval not displayed.   GFR: Estimated Creatinine Clearance: 36.8 mL/min (A) (by C-G formula based on SCr of 1.78 mg/dL (H)). Liver Function Tests: Recent Labs  Lab 06/02/19 1608 06/03/19 0640 06/03/19 0847 06/04/19 0549 06/06/19 0539  AST 25 QUESTIONABLE RESULTS, RECOMMEND RECOLLECT TO VERIFY 23  --  27  ALT 19 QUESTIONABLE RESULTS, RECOMMEND RECOLLECT TO VERIFY 16  --  15  ALKPHOS 74 QUESTIONABLE RESULTS, RECOMMEND RECOLLECT TO VERIFY 64  --  63  BILITOT 0.8 QUESTIONABLE RESULTS, RECOMMEND RECOLLECT TO VERIFY 0.9  --  0.6  PROT 7.3 QUESTIONABLE RESULTS, RECOMMEND RECOLLECT TO VERIFY 6.6  --  7.2  ALBUMIN 3.9 QUESTIONABLE RESULTS, RECOMMEND RECOLLECT TO VERIFY 3.4* 3.5 3.2*   Recent Labs  Lab 06/02/19 1608  LIPASE 29   No results for input(s): AMMONIA in the last 168 hours. Coagulation Profile: No results for input(s): INR, PROTIME in the last 168 hours. Cardiac Enzymes: No results for input(s): CKTOTAL, CKMB, CKMBINDEX, TROPONINI in the last 168 hours. BNP (last 3 results) No results for input(s): PROBNP in the last 8760 hours. HbA1C: No results for input(s): HGBA1C in the last 72 hours. CBG: Recent Labs  Lab 06/06/19 1601 06/06/19 2040 06/07/19 0620 06/07/19 0746 06/07/19 1122  GLUCAP 102* 170* 174* 170* 167*   Lipid Profile: No results for input(s): CHOL, HDL, LDLCALC, TRIG, CHOLHDL, LDLDIRECT in the  last 72 hours.  Thyroid Function Tests: No results for input(s): TSH, T4TOTAL, FREET4, T3FREE, THYROIDAB in the last 72 hours. Anemia Panel: No results for input(s): VITAMINB12, FOLATE, FERRITIN, TIBC, IRON, RETICCTPCT in the last 72 hours. Sepsis Labs: No results for input(s): PROCALCITON, LATICACIDVEN in the last 168 hours.  Recent Results (from the past 240 hour(s))  SARS Coronavirus 2 (CEPHEID - Performed in [place] hospital lab), Hosp Order     Status: None   Collection Time: 06/02/19  8:17 PM   Specimen: Nasopharyngeal Swab  Result Value Ref Range Status   SARS Coronavirus 2 NEGATIVE NEGATIVE Final    Comment: (NOTE) If result is NEGATIVE SARS-CoV-2 target nucleic acids are NOT DETECTED. The SARS-CoV-2 RNA is generally detectable in upper and lower  respiratory specimens during the acute phase of infection. The lowest  concentration of SARS-CoV-2 viral copies this assay can detect is 250  copies / mL. A negative result does not preclude SARS-CoV-2 infection  and should not be used as the sole basis for treatment or other  patient management decisions.  A negative result may occur with  improper specimen collection / handling, submission of specimen other  than nasopharyngeal swab, presence of viral mutation(s) within the  areas targeted by this assay, and inadequate number of viral copies  (<250 copies / mL). A negative result must be combined with clinical  observations, patient history, and epidemiological information. If result is POSITIVE SARS-CoV-2 target nucleic acids are DETECTED. The SARS-CoV-2 RNA is generally detectable in upper and lower  respiratory specimens dur ing the acute phase of infection.  Positive  results are indicative of active infection with SARS-CoV-2.  Clinical  correlation with patient history and other diagnostic information is  necessary to determine patient infection status.  Positive results do  not rule out bacterial infection or co-infection with other  viruses. If result is PRESUMPTIVE POSTIVE SARS-CoV-2 nucleic acids MAY BE PRESENT.   A presumptive positive result was obtained on the submitted specimen  and confirmed on repeat testing.  While 2019 novel coronavirus  (SARS-CoV-2) nucleic acids may be present in the submitted sample  additional confirmatory testing may be necessary for epidemiological  and / or clinical management purposes  to differentiate between  SARS-CoV-2 and other Sarbecovirus currently known to infect humans.  If clinically indicated additional testing with an alternate test  methodology [phone]) is advised. The SARS-CoV-2 RNA is generally  detectable in upper and lower respiratory sp ecimens during the acute  phase of infection. The expected result is Negative. Fact Sheet for Patients:  StrictlyIdeas.no Fact Sheet for Healthcare Providers: BankingDealers.co.za This test is not yet approved or cleared by the [place] FDA and has been authorized for detection and/or diagnosis of SARS-CoV-2 by FDA under an Emergency Use Authorization (EUA).  This EUA will remain in effect (meaning this test can be used) for the duration of the COVID-19 declaration under Section 564(b)(1) of the Act, 21 U.S.C. section 360bbb-3(b)(1), unless the authorization is terminated or revoked sooner. Performed at [facility], [place] [address], [place], [place] [address]   MRSA PCR Screening     Status: Abnormal   Collection Time: 06/03/19  6:16 PM   Specimen: Nasopharyngeal  Result Value Ref Range Status   MRSA by PCR POSITIVE (A) NEGATIVE Final    Comment:        The GeneXpert MRSA Assay (FDA approved for NASAL specimens only), is one component of a comprehensive MRSA colonization surveillance program. It  is not intended to diagnose MRSA infection nor to guide or monitor treatment for MRSA infections. RESULT CALLED TO, READ BACK BY AND VERIFIED WITHMargarita Grizzle RN [id number] 06/04/19 A NAVARRO Performed at [facility], [place] [address] [place], [place] [address]      Radiology Studies: No results found.  Scheduled Meds: . amitriptyline  10 mg Oral QHS  . aspirin EC  81 mg Oral Daily  . Chlorhexidine Gluconate Cloth  6 each Topical Daily  . cholecalciferol  2,000 Units Oral Daily  . clopidogrel  75 mg Oral Daily  . enoxaparin (LOVENOX) injection  0.5 mg/kg Subcutaneous QHS  . insulin aspart  0-5 Units Subcutaneous QHS  . insulin aspart  0-9 Units Subcutaneous TID WC  . insulin aspart  5 Units Subcutaneous TID WC  . insulin glargine  50 Units Subcutaneous BID  . metoprolol tartrate  25 mg Oral BID  . mupirocin ointment   Nasal BID  . pantoprazole  40 mg Oral Daily  . potassium chloride SA  40 mEq Oral BID  . senna-docusate  1 tablet Oral BID  . simvastatin  20 mg Oral Daily  . sodium chloride flush  3 mL Intravenous Q12H  . terazosin  4 mg Oral Daily  . vitamin B-12  1,000 mcg Oral Daily   Continuous Infusions: . sodium chloride    . lactated ringers 75 mL/hr at 06/07/19 1119     LOS: 4 days   Marylu Lund, MD [facility] Pager On Amion  If 7PM-7AM, please contact night-coverage 06/07/2019, 4:31 PM

## 2019-06-08 ENCOUNTER — Inpatient Hospital Stay (HOSPITAL_COMMUNITY): Payer: Medicare Other

## 2019-06-08 DIAGNOSIS — R140 Abdominal distension (gaseous): Secondary | ICD-10-CM

## 2019-06-08 LAB — COMPREHENSIVE METABOLIC PANEL
ALT: 17 U/L (ref 0–44)
AST: 24 U/L (ref 15–41)
Albumin: 3 g/dL — ABNORMAL LOW (ref 3.5–5.0)
Alkaline Phosphatase: 57 U/L (ref 38–126)
Anion gap: 7 (ref 5–15)
BUN: 34 mg/dL — ABNORMAL HIGH (ref 8–23)
CO2: 39 mmol/L — ABNORMAL HIGH (ref 22–32)
Calcium: 9.6 mg/dL (ref 8.9–10.3)
Chloride: 96 mmol/L — ABNORMAL LOW (ref 98–111)
Creatinine, Ser: 1.5 mg/dL — ABNORMAL HIGH (ref 0.61–1.24)
GFR calc Af Amer: 48 mL/min — ABNORMAL LOW (ref >60)
GFR calc non Af Amer: 42 mL/min — ABNORMAL LOW (ref >60)
Glucose, Bld: 161 mg/dL — ABNORMAL HIGH (ref 70–99)
Potassium: 3.7 mmol/L (ref 3.5–5.1)
Sodium: 142 mmol/L (ref 135–145)
Total Bilirubin: 0.3 mg/dL (ref 0.3–1.2)
Total Protein: 6.1 g/dL — ABNORMAL LOW (ref 6.5–8.1)

## 2019-06-08 LAB — HEMOGLOBIN A1C
Hgb A1c MFr Bld: 8.2 % — ABNORMAL HIGH (ref 4.8–5.6)
Mean Plasma Glucose: 188.64 mg/dL

## 2019-06-08 LAB — GLUCOSE, CAPILLARY
Glucose-Capillary: 124 mg/dL — ABNORMAL HIGH (ref 70–99)
Glucose-Capillary: 153 mg/dL — ABNORMAL HIGH (ref 70–99)
Glucose-Capillary: 77 mg/dL (ref 70–99)
Glucose-Capillary: 60 mg/dL — ABNORMAL LOW (ref 70–99)
Glucose-Capillary: 74 mg/dL (ref 70–99)

## 2019-06-08 MED ORDER — ASPIRIN 300 MG RE SUPP
300.0000 mg | Freq: Every day | RECTAL | Status: DC
  Administered 2019-06-09 – 2019-06-12 (×4): 300 mg via RECTAL
  Filled 2019-06-08 (×5): qty 1

## 2019-06-08 MED ORDER — METOPROLOL TARTRATE 5 MG/5ML IV SOLN
5.0000 mg | Freq: Four times a day (QID) | INTRAVENOUS | Status: DC
  Administered 2019-06-08 – 2019-06-12 (×15): 5 mg via INTRAVENOUS
  Filled 2019-06-08 (×16): qty 5

## 2019-06-08 MED ORDER — DEXTROSE-NACL 5-0.9 % IV SOLN
INTRAVENOUS | Status: DC
  Administered 2019-06-08 – 2019-06-09 (×3): via INTRAVENOUS

## 2019-06-08 MED ORDER — ASPIRIN 300 MG RE SUPP: 300.0000 mg | Freq: Every day | RECTAL | Status: DC

## 2019-06-08 MED ORDER — INSULIN ASPART 100 UNIT/ML ~~LOC~~ SOLN
0.0000 [IU] | SUBCUTANEOUS | Status: DC
  Administered 2019-06-10: 2 [IU] via SUBCUTANEOUS
  Administered 2019-06-10: 3 [IU] via SUBCUTANEOUS
  Administered 2019-06-11 – 2019-06-13 (×3): 2 [IU] via SUBCUTANEOUS
  Administered 2019-06-13: 3 [IU] via SUBCUTANEOUS
  Administered 2019-06-14: 2 [IU] via SUBCUTANEOUS
  Administered 2019-06-14: 12:00:00 3 [IU] via SUBCUTANEOUS

## 2019-06-08 MED ORDER — INSULIN GLARGINE 100 UNIT/ML ~~LOC~~ SOLN
20.0000 [IU] | Freq: Two times a day (BID) | SUBCUTANEOUS | Status: DC
  Filled 2019-06-08 (×2): qty 0.2

## 2019-06-08 MED ORDER — PANTOPRAZOLE SODIUM 40 MG IV SOLR
40.0000 mg | INTRAVENOUS | Status: DC
  Administered 2019-06-08 – 2019-06-11 (×4): 40 mg via INTRAVENOUS
  Filled 2019-06-08 (×5): qty 40

## 2019-06-08 MED ORDER — HYDRALAZINE HCL 20 MG/ML IJ SOLN
10.0000 mg | Freq: Four times a day (QID) | INTRAMUSCULAR | Status: DC | PRN
  Administered 2019-06-08: 10 mg via INTRAVENOUS
  Filled 2019-06-08: qty 1

## 2019-06-08 MED ORDER — MORPHINE SULFATE (PF) 2 MG/ML IV SOLN
1.0000 mg | INTRAVENOUS | Status: DC | PRN
  Administered 2019-06-08 – 2019-06-09 (×3): 1 mg via INTRAVENOUS
  Filled 2019-06-08 (×3): qty 1

## 2019-06-08 NOTE — Progress Notes (Signed)
PROGRESS NOTE    Jared Hall  ERX:[id number] DOB: 1931-01-10 DOA: 06/02/2019 PCP: Josetta Huddle, MD    Brief Narrative:  85 y.o.male,w Anemia, Thallassemia, hypertension, hyperlipidemia, Dm2, h/o Stroke, Pafib, CHF (EF 55%), OSA, Bph/ prostate cancer s/p XRT apparently present with c/o nausea as well as abdominal discomfort "cramping". Pt denies fever, chills, cough, cp, palp, constipation, diarrhea, brbpr, black stool.   Assessment & Plan:   Principal Problem:   Hypokalemia Active Problems:   DM (diabetes mellitus) ([place])   History of CVA (cerebrovascular accident)   Essential hypertension, benign   GERD (gastroesophageal reflux disease)   DM type 2 with diabetic peripheral neuropathy (HCC)   Paroxysmal atrial fibrillation (HCC)   Chronic kidney disease, stage III (moderate) (HCC)   Anemia  Hypokalemia likely secondary to diuretics (lasix, metolazone)     - Consider endocrinology referral as outpatient when stable to r/o Conns syndrome, Cushings, or CAH      - MG2+ was ok, monitor     - Potassium corrected     - Recheck BMET in AM  Pafib , chronic diastolic CHF (EF 95%)     - Presently stable at this AM     - clinically dehydrated, thus hold further diuretic     - Continued on gentle IVF hydration    - Pt now NPO, see below. Thus have transitioned to IV beta blocker and held plavix. Will continue PR aspirin until pt is able to tolerate PO  DM2 w CKD stage3     - Now NPO per below     - Will decrease lantus from 50 units BID to 20units bid    - Continue on Q4h SSI coverage    - Will continue patient on D5 fluids  Diabetic neuropathy, Chronic pain     - Seems to be stable at present  Hyperlipidemia, h/o CVA     - Simvastatin 20mg  po qhs on hold as pt is now NPO per below     - Cont aspirin, plavix on hold as pt is NPO    -  Presently stable stable  Gerd     - Continue with protonix as pt tolerates, changed to IV   BPH     - seems to be stable   Constipation     - Recently with good results from soap suds enema  Anemia, Thallasemia     - Hgb 10.3; MCV 72  ARF     - Cr improved with IVF overnight     - Dry membranes on exam    - held diuretic per above and will continue gentle IVF hydration     - Repeat bmet in AM  Gastric outlet obstruction     - Abd remains distended on exam    - Abd xray ordered and personally reviewed. Findings worrisome for newly distended stomach concerning for gastric outlet obstruction    - Will keep NPO and place NG to low wall suction    -Have ordered repeat CT abd/pelvis to compare to previous study on 7/13    -Pending results, consider GI consultation  DVT prophylaxis: Lovenox subQ Code Status: DNR Family Communication: Pt in room, family not at bedside Disposition Plan: return to SNF when stable  Consultants:     Procedures:     Antimicrobials: Anti-infectives (From admission, onward)   None      Subjective: Difficult to assess given current mentation  Objective: Vitals:   06/07/19 1332 06/07/19 2019 06/08/19 0532  06/08/19 1355  BP: (!) 169/66 131/72 129/66 (!) 120/45  Pulse: 85 91 (!) 57 78  Resp: 20 (!) 21 20 18   Temp: 98.2 F (36.8 C) 98 F (36.7 C) 98.3 F (36.8 C) 98.4 F (36.9 C)  TempSrc: Oral Oral Oral Oral  SpO2: 100% 100% 100% 99%  Weight:   113 kg   Height:        Intake/Output Summary (Last 24 hours) at 06/08/2019 1516 Last data filed at 06/08/2019 0600 Gross per 24 hour  Intake 1995.03 ml  Output -  Net 1995.03 ml   Filed Weights   06/03/19 0500 06/05/19 0550 06/08/19 0532  Weight: 112.9 kg 109 kg 113 kg    Examination: General exam: somewhat conversant, in no acute distress Respiratory system: normal chest rise, clear, no audible wheezing Cardiovascular system: regular rhythm, s1-s2 Gastrointestinal system: Distended, decreased BS, obese Central nervous system: No seizures, no tremors Extremities: No cyanosis, no joint deformities Skin:  No rashes, no pallor Psychiatry: Difficult to assess given current mentation  Data Reviewed: I have personally reviewed following labs and imaging studies  CBC: Recent Labs  Lab 06/02/19 1608 06/03/19 0847 06/04/19 0549  WBC 10.2 9.2 9.3  NEUTROABS  --   --  6.1  HGB 11.2* 10.3* 10.5*  HCT 37.0* 34.7* 36.6*  MCV 71.0* 72.1* 73.2*  PLT 267 237 629   Basic Metabolic Panel: Recent Labs  Lab 06/02/19 2145  06/04/19 0549 06/05/19 0500 06/06/19 0539 06/07/19 0659 06/08/19 0615  NA  --    < > 143 140 141 139 142  K  --    < > 3.1* 3.2* 4.0 3.6 3.7  CL  --    < > 94* 93* 96* 93* 96*  CO2  --    < > 37* 36* 31 34* 39*  GLUCOSE  --    < > 94 56* 179* 175* 161*  BUN  --    < > 18 18 21  35* 34*  CREATININE  --    < > 1.44* 1.46* 1.49* 1.78* 1.50*  CALCIUM  --    < > 9.4 9.4 9.7 9.6 9.6  MG 1.7  --  1.7  --   --   --   --   PHOS  --   --  2.3*  --   --   --   --    < > = values in this interval not displayed.   GFR: Estimated Creatinine Clearance: 44.5 mL/min (A) (by C-G formula based on SCr of 1.5 mg/dL (H)). Liver Function Tests: Recent Labs  Lab 06/02/19 1608 06/03/19 0640 06/03/19 0847 06/04/19 0549 06/06/19 0539 06/08/19 0615  AST 25 QUESTIONABLE RESULTS, RECOMMEND RECOLLECT TO VERIFY 23  --  27 24  ALT 19 QUESTIONABLE RESULTS, RECOMMEND RECOLLECT TO VERIFY 16  --  15 17  ALKPHOS 74 QUESTIONABLE RESULTS, RECOMMEND RECOLLECT TO VERIFY 64  --  63 57  BILITOT 0.8 QUESTIONABLE RESULTS, RECOMMEND RECOLLECT TO VERIFY 0.9  --  0.6 0.3  PROT 7.3 QUESTIONABLE RESULTS, RECOMMEND RECOLLECT TO VERIFY 6.6  --  7.2 6.1*  ALBUMIN 3.9 QUESTIONABLE RESULTS, RECOMMEND RECOLLECT TO VERIFY 3.4* 3.5 3.2* 3.0*   Recent Labs  Lab 06/02/19 1608  LIPASE 29   No results for input(s): AMMONIA in the last 168 hours. Coagulation Profile: No results for input(s): INR, PROTIME in the last 168 hours. Cardiac Enzymes: No results for input(s): CKTOTAL, CKMB, CKMBINDEX, TROPONINI in the last  168 hours. BNP (last  3 results) No results for input(s): PROBNP in the last 8760 hours. HbA1C: No results for input(s): HGBA1C in the last 72 hours. CBG: Recent Labs  Lab 06/07/19 1653 06/07/19 2022 06/07/19 2131 06/08/19 0800 06/08/19 1143  GLUCAP 141* 146* 131* 124* 153*   Lipid Profile: No results for input(s): CHOL, HDL, LDLCALC, TRIG, CHOLHDL, LDLDIRECT in the last 72 hours. Thyroid Function Tests: No results for input(s): TSH, T4TOTAL, FREET4, T3FREE, THYROIDAB in the last 72 hours. Anemia Panel: No results for input(s): VITAMINB12, FOLATE, FERRITIN, TIBC, IRON, RETICCTPCT in the last 72 hours. Sepsis Labs: No results for input(s): PROCALCITON, LATICACIDVEN in the last 168 hours.  Recent Results (from the past 240 hour(s))  SARS Coronavirus 2 (CEPHEID - Performed in [facility] hospital lab), Hosp Order     Status: None   Collection Time: 06/02/19  8:17 PM   Specimen: Nasopharyngeal Swab  Result Value Ref Range Status   SARS Coronavirus 2 NEGATIVE NEGATIVE Final    Comment: (NOTE) If result is NEGATIVE SARS-CoV-2 target nucleic acids are NOT DETECTED. The SARS-CoV-2 RNA is generally detectable in upper and lower  respiratory specimens during the acute phase of infection. The lowest  concentration of SARS-CoV-2 viral copies this assay can detect is 250  copies / mL. A negative result does not preclude SARS-CoV-2 infection  and should not be used as the sole basis for treatment or other  patient management decisions.  A negative result may occur with  improper specimen collection / handling, submission of specimen other  than nasopharyngeal swab, presence of viral mutation(s) within the  areas targeted by this assay, and inadequate number of viral copies  (<250 copies / mL). A negative result must be combined with clinical  observations, patient history, and epidemiological information. If result is POSITIVE SARS-CoV-2 target nucleic acids are DETECTED. The  SARS-CoV-2 RNA is generally detectable in upper and lower  respiratory specimens dur ing the acute phase of infection.  Positive  results are indicative of active infection with SARS-CoV-2.  Clinical  correlation with patient history and other diagnostic information is  necessary to determine patient infection status.  Positive results do  not rule out bacterial infection or co-infection with other viruses. If result is PRESUMPTIVE POSTIVE SARS-CoV-2 nucleic acids MAY BE PRESENT.   A presumptive positive result was obtained on the submitted specimen  and confirmed on repeat testing.  While 2019 novel coronavirus  (SARS-CoV-2) nucleic acids may be present in the submitted sample  additional confirmatory testing may be necessary for epidemiological  and / or clinical management purposes  to differentiate between  SARS-CoV-2 and other Sarbecovirus currently known to infect humans.  If clinically indicated additional testing with an alternate test  methodology [phone]) is advised. The SARS-CoV-2 RNA is generally  detectable in upper and lower respiratory sp ecimens during the acute  phase of infection. The expected result is Negative. Fact Sheet for Patients:  StrictlyIdeas.no Fact Sheet for Healthcare Providers: BankingDealers.co.za This test is not yet approved or cleared by the [place] FDA and has been authorized for detection and/or diagnosis of SARS-CoV-2 by FDA under an Emergency Use Authorization (EUA).  This EUA will remain in effect (meaning this test can be used) for the duration of the COVID-19 declaration under Section 564(b)(1) of the Act, 21 U.S.C. section 360bbb-3(b)(1), unless the authorization is terminated or revoked sooner. Performed at [facility], [place] [address] [place], [place] [address]   MRSA PCR Screening     Status:  Abnormal   Collection Time: 06/03/19  6:16 PM   Specimen:  Nasopharyngeal  Result Value Ref Range Status   MRSA by PCR POSITIVE (A) NEGATIVE Final    Comment:        The GeneXpert MRSA Assay (FDA approved for NASAL specimens only), is one component of a comprehensive MRSA colonization surveillance program. It is not intended to diagnose MRSA infection nor to guide or monitor treatment for MRSA infections. RESULT CALLED TO, READ BACK BY AND VERIFIED WITHMargarita Grizzle RN [id number] 06/04/19 A NAVARRO Performed at [facility], [place] [address], [place], [place] [address]      Radiology Studies: Dg Abd Portable 1v  Result Date: 06/08/2019 CLINICAL DATA:  Abdominal distension and pain. EXAM: PORTABLE ABDOMEN - 1 VIEW COMPARISON:  CT 06/02/2019 and KUB 01/07/2017 FINDINGS: There is extensive gaseous distention of the stomach new since 06/02/2019. Air is present throughout the colon. No free peritoneal air. Remainder of the exam is unchanged. IMPRESSION: Extensive gaseous distention of the stomach new since 06/02/2019 which may be due to gastric outlet obstruction. Electronically Signed   By: Marin Olp M.D.   On: 06/08/2019 13:29    Scheduled Meds: . Chlorhexidine Gluconate Cloth  6 each Topical Daily  . enoxaparin (LOVENOX) injection  0.5 mg/kg Subcutaneous QHS  . insulin aspart  0-15 Units Subcutaneous Q4H  . insulin glargine  20 Units Subcutaneous BID  . metoprolol tartrate  5 mg Intravenous Q6H  . mupirocin ointment   Nasal BID  . pantoprazole (PROTONIX) IV  40 mg Intravenous Q24H  . sodium chloride flush  3 mL Intravenous Q12H   Continuous Infusions: . sodium chloride    . dextrose 5 % and 0.9% NaCl       LOS: 5 days   Marylu Lund, MD [facility] Pager On Amion  If 7PM-7AM, please contact night-coverage 06/08/2019, 3:16 PM

## 2019-06-09 LAB — COMPREHENSIVE METABOLIC PANEL
ALT: 21 U/L (ref 0–44)
AST: 29 U/L (ref 15–41)
Albumin: 3 g/dL — ABNORMAL LOW (ref 3.5–5.0)
Alkaline Phosphatase: 56 U/L (ref 38–126)
Anion gap: 6 (ref 5–15)
BUN: 27 mg/dL — ABNORMAL HIGH (ref 8–23)
CO2: 36 mmol/L — ABNORMAL HIGH (ref 22–32)
Calcium: 9.5 mg/dL (ref 8.9–10.3)
Chloride: 102 mmol/L (ref 98–111)
Creatinine, Ser: 1.37 mg/dL — ABNORMAL HIGH (ref 0.61–1.24)
GFR calc Af Amer: 54 mL/min — ABNORMAL LOW (ref >60)
GFR calc non Af Amer: 46 mL/min — ABNORMAL LOW (ref >60)
Glucose, Bld: 78 mg/dL (ref 70–99)
Potassium: 3.3 mmol/L — ABNORMAL LOW (ref 3.5–5.1)
Sodium: 144 mmol/L (ref 135–145)
Total Bilirubin: 0.5 mg/dL (ref 0.3–1.2)
Total Protein: 6.1 g/dL — ABNORMAL LOW (ref 6.5–8.1)

## 2019-06-09 LAB — CBC
HCT: 36.6 % — ABNORMAL LOW (ref 39.0–52.0)
Hemoglobin: 10.8 g/dL — ABNORMAL LOW (ref 13.0–17.0)
MCH: 21.6 pg — ABNORMAL LOW (ref 26.0–34.0)
MCHC: 29.5 g/dL — ABNORMAL LOW (ref 30.0–36.0)
MCV: 73.2 fL — ABNORMAL LOW (ref 80.0–100.0)
Platelets: 251 10*3/uL (ref 150–400)
RBC: 5 MIL/uL (ref 4.22–5.81)
RDW: 15.8 % — ABNORMAL HIGH (ref 11.5–15.5)
WBC: 10.3 10*3/uL (ref 4.0–10.5)
nRBC: 0 % (ref 0.0–0.2)

## 2019-06-09 LAB — GLUCOSE, CAPILLARY
Glucose-Capillary: 65 mg/dL — ABNORMAL LOW (ref 70–99)
Glucose-Capillary: 67 mg/dL — ABNORMAL LOW (ref 70–99)
Glucose-Capillary: 89 mg/dL (ref 70–99)
Glucose-Capillary: 115 mg/dL — ABNORMAL HIGH (ref 70–99)
Glucose-Capillary: 92 mg/dL (ref 70–99)

## 2019-06-09 LAB — MAGNESIUM: Magnesium: 1.9 mg/dL (ref 1.7–2.4)

## 2019-06-09 MED ORDER — INSULIN GLARGINE 100 UNIT/ML ~~LOC~~ SOLN: 10.0000 [IU] | Freq: Every day | SUBCUTANEOUS | Status: DC

## 2019-06-09 MED ORDER — POTASSIUM CHLORIDE 10 MEQ/100ML IV SOLN: 10.0000 meq | INTRAVENOUS | Status: DC

## 2019-06-09 MED ORDER — BISACODYL 5 MG PO TBEC: 5.0000 mg | DELAYED_RELEASE_TABLET | Freq: Once | ORAL | Status: DC

## 2019-06-09 MED ORDER — POTASSIUM CHLORIDE 10 MEQ/100ML IV SOLN
10.0000 meq | INTRAVENOUS | Status: AC
  Administered 2019-06-09 (×4): 10 meq via INTRAVENOUS
  Filled 2019-06-09 (×4): qty 100

## 2019-06-09 NOTE — Consult Note (Signed)
Referring Provider:  Dr. Levora Angel ([facility]) Primary Care Physician:  Josetta Huddle, MD Primary Gastroenterologist:  None (unassigned)  Reason for Consultation:  Possible gastric outlet obstruction  HPI: Jared Hall is a 85 y.o. male who [facility] following a stroke, who has been nonambulatory for several years but gets around in a wheelchair and is independent in transfers, and who has underlying medical problems including history of stroke, history of prostate cancer status post radiation therapy, diabetes, paroxysmal atrial fibrillation, CHF with an EF of 55%, and sleep apnea, admitted to the hospital a week ago with severe hypokalemia (potassium 2.3) and abdominal cramping.    Perhaps initially that was thought to be due to constipation.  In any event, the hypokalemia has been corrected during his 1 week hospitalization and the constipation treated with soapsuds enemas, but it was noted that his food intake was very poor, and yesterday that the abdomen was significantly distended and an abdominal plain film showed marked distention of the stomach.    An NG tube was placed with prompt return of a liter of gastric contents.  It continues to put out quite large volumes.  Accordingly, we were asked to see the patient.  The patient was on daily low-dose aspirin prior to admission as well as in the hospital, but also was on Protonix both prior to admission and while in house.  He does have diabetes but there is no underlying diagnosis of antecedent gastroparesis.   Past Medical History:  Diagnosis Date  . Alpha thalassemia ([place])   . Anemia 01/01/2017  . BPH (benign prostatic hyperplasia)   . CHF (congestive heart failure) ([place])   . High cholesterol   . Hypertension   . Lower extremity edema   . Onychomycosis   . Prostate cancer ([place])    S/P "8 weeks of radiation"  . Prostatitis    recurrent  . Sleep apnea   . Stroke [facility]) ~ 2005   denies  residual on 2/123/2015  . Type II diabetes mellitus ([place])   . Umbilical hernia   . Urinary urgency    with incontinence    Past Surgical History:  Procedure Laterality Date  . ADENOIDECTOMY    . HERNIA REPAIR    . MASTOIDECTOMY    . TONSILLECTOMY    . VASECTOMY    . VENTRAL HERNIA REPAIR  12/18/2011   Procedure: HERNIA REPAIR VENTRAL ADULT;  Surgeon: Adin Hector, MD;  Location: [facility];  Service: General;  Laterality: N/A;    Prior to Admission medications   Medication Sig Start Date End Date Taking? Authorizing Provider  acetaminophen (TYLENOL) 500 MG tablet Take 500 mg by mouth every 6 (six) hours as needed for fever.   Yes [provider]  amitriptyline (ELAVIL) 10 MG tablet Take 10 mg by mouth at bedtime.   Yes [provider]  aspirin EC 81 MG tablet Take 81 mg by mouth daily.   Yes [provider]  Benzocaine-Menthol (CEPACOL) 15-2.3 MG LOZG Use as directed 1 lozenge in the mouth or throat 4 (four) times daily as needed (cough).   Yes [provider]  clopidogrel (PLAVIX) 75 MG tablet Take 75 mg by mouth daily.    Yes [provider]  cyclobenzaprine (FLEXERIL) 5 MG tablet Take 5 mg by mouth 2 (two) times daily as needed for muscle spasms.   Yes [provider]  furosemide (LASIX) 80 MG tablet Take 80 mg by mouth  2 (two) times daily.    Yes [provider]  insulin aspart (NOVOLOG FLEXPEN) 100 UNIT/ML FlexPen Inject 5 Units into the skin 3 (three) times daily with meals.   Yes [provider]  insulin aspart (NOVOLOG) 100 UNIT/ML injection Inject 7-18 Units into the skin 2 (two) times daily. Blood sugar < 150 = 0 units 151-200 = 7 units 201-250 = 9 units 251-300 = 11 units 301-350 = 13 units 351-400 = 15 units >400 = 18 units   Yes [provider]  insulin glargine (LANTUS) 100 UNIT/ML injection Inject 0.5 mLs (50 Units total) into the skin 2 (two) times daily. Patient taking differently:  Inject 70 Units into the skin 2 (two) times daily.  10/20/17  Yes Rosita Fire, MD  metoprolol tartrate (LOPRESSOR) 25 MG tablet Take 1 tablet (25 mg total) by mouth 2 (two) times daily. 03/17/15  Yes Kelvin Cellar, MD  pantoprazole (PROTONIX) 40 MG tablet Take 40 mg by mouth daily.    Yes [provider]  Polyethyl Glycol-Propyl Glycol (SYSTANE) 0.4-0.3 % GEL ophthalmic gel Place 1 application into both eyes 3 (three) times daily as needed (dry eyes).   Yes [provider]  potassium chloride SA (K-DUR,KLOR-CON) 20 MEQ tablet Take 20-40 mEq by mouth as directed. Take 1 tablet on (Sun, Mon, Wed, Thurs & Sat.) & Take 2 tablets on (Tues & Fri) if Metolazone is taken.   Yes [provider]  senna-docusate (SENOKOT-S) 8.6-50 MG tablet Take 1 tablet by mouth 2 (two) times daily.   Yes [provider]  simvastatin (ZOCOR) 20 MG tablet Take 20 mg by mouth daily.   Yes [provider]  terazosin (HYTRIN) 2 MG capsule Take 4 mg by mouth daily.    Yes [provider]  traMADol (ULTRAM) 50 MG tablet Take 1 tablet (50 mg total) by mouth every 6 (six) hours as needed for moderate pain. 05/24/18  Yes Ghimire, Henreitta Leber, MD  vitamin B-12 (CYANOCOBALAMIN) 1000 MCG tablet Take 1,000 mcg by mouth daily.   Yes [provider]  Vitamin D, Cholecalciferol, 50 MCG (2000 UT) CAPS Take 1 capsule by mouth daily.   Yes [provider]  amitriptyline (ELAVIL) 25 MG tablet Take 1 tablet (25 mg total) by mouth at bedtime. Patient not taking: Reported on 06/02/2019 10/20/17   Rosita Fire, MD  azelastine (OPTIVAR) 0.05 % ophthalmic solution Place 1 drop into both eyes 2 (two) times daily as needed (dry eyes).     [provider]  doxycycline (VIBRA-TABS) 100 MG tablet Take 1 tablet (100 mg total) by mouth 2 (two) times daily. Patient not taking: Reported on 06/02/2019 05/24/18   Jonetta Osgood, MD  fluticasone [facility]) 50 MCG/ACT  nasal spray Place 1 spray into both nostrils daily. Patient not taking: Reported on 06/02/2019 03/17/15   Kelvin Cellar, MD  gabapentin (NEURONTIN) 100 MG capsule Take 2 capsules (200 mg total) by mouth 2 (two) times daily. Hold for sedation. Patient not taking: Reported on 06/02/2019 05/24/18   Jonetta Osgood, MD  metolazone (ZAROXOLYN) 5 MG tablet Take 5 mg by mouth as directed. Take 1 tablet every Tues & Fri. Hold If No Leg Edema 05/20/19   [provider]    Current Facility-Administered Medications  Medication Dose Route Frequency Provider Last Rate Last Dose  . 0.9 %  sodium chloride infusion  250 mL Intravenous PRN Jani Gravel, MD      . acetaminophen (TYLENOL) tablet 650  mg  650 mg Oral Q6H PRN Jani Gravel, MD   650 mg at 06/07/19 1635   Or  . acetaminophen (TYLENOL) suppository 650 mg  650 mg Rectal Q6H PRN Jani Gravel, MD      . aspirin suppository 300 mg  300 mg Rectal Daily Donne Hazel, MD      . Chlorhexidine Gluconate Cloth 2 % PADS 6 each  6 each Topical Daily Donne Hazel, MD   6 each at 06/09/19 [phone]  . dextrose 5 %-0.9 % sodium chloride infusion   Intravenous Continuous Bodenheimer, Charles A, NP 100 mL/hr at 06/09/19 3382    . enoxaparin (LOVENOX) injection 55 mg  0.5 mg/kg Subcutaneous QHS Kyle, Tyrone A, DO   55 mg at 06/08/19 2236  . hydrALAZINE (APRESOLINE) injection 10 mg  10 mg Intravenous Q6H PRN Vertis Kelch, NP   10 mg at 06/08/19 2104  . insulin aspart (novoLOG) injection 0-15 Units  0-15 Units Subcutaneous Q4H Donne Hazel, MD      . metoprolol tartrate (LOPRESSOR) injection 5 mg  5 mg Intravenous Q6H Donne Hazel, MD   5 mg at 06/09/19 0556  . morphine 2 MG/ML injection 1 mg  1 mg Intravenous Q4H PRN Donne Hazel, MD   1 mg at 06/09/19 0412  . mupirocin ointment (BACTROBAN) 2 %   Nasal BID Donne Hazel, MD      . ondansetron [facility]) injection 4 mg  4 mg Intravenous Q6H PRN Jani Gravel, MD   4 mg at 06/07/19 1437  . pantoprazole  (PROTONIX) injection 40 mg  40 mg Intravenous Q24H Donne Hazel, MD   40 mg at 06/08/19 1825  . polyvinyl alcohol (LIQUIFILM TEARS) 1.4 % ophthalmic solution 1 drop  1 drop Both Eyes TID PRN Jani Gravel, MD      . potassium chloride 10 mEq in 100 mL IVPB  10 mEq Intravenous Q1 Hr x 4 Donne Hazel, MD 100 mL/hr at 06/09/19 1200 10 mEq at 06/09/19 1200  . sodium chloride flush (NS) 0.9 % injection 3 mL  3 mL Intravenous Q12H Jani Gravel, MD   3 mL at 06/09/19 0946  . sodium chloride flush (NS) 0.9 % injection 3 mL  3 mL Intravenous PRN Jani Gravel, MD        Allergies as of 06/02/2019 - Review Complete 06/02/2019  Allergen Reaction Noted  . Ativan [lorazepam] Other (See Comments) 01/01/2017  . Flomax [tamsulosin hcl] Other (See Comments) 06/15/2014  . Glucophage [metformin hcl] Diarrhea 06/15/2014    Family History  Problem Relation Age of Onset  . Pneumonia Mother   . Heart attack Father     Social History   Socioeconomic History  . Marital status: Widowed    Spouse name: Not on file  . Number of children: Not on file  . Years of education: Not on file  . Highest education level: Not on file  Occupational History  . Not on file  Social Needs  . Financial resource strain: Not on file  . Food insecurity    Worry: Not on file    Inability: Not on file  . Transportation needs    Medical: Not on file    Non-medical: Not on file  Tobacco Use  . Smoking status: Never Smoker  . Smokeless tobacco: Never Used  Substance and Sexual Activity  . Alcohol use: No  . Drug use: No  . Sexual activity: Never  Lifestyle  .  Physical activity    Days per week: Not on file    Minutes per session: Not on file  . Stress: Not on file  Relationships  . Social Herbalist on phone: Not on file    Gets together: Not on file    Attends religious service: Not on file    Active member of club or organization: Not on file    Attends meetings of clubs or organizations: Not on file     Relationship status: Not on file  . Intimate partner violence    Fear of current or ex partner: Not on file    Emotionally abused: Not on file    Physically abused: Not on file    Forced sexual activity: Not on file  Other Topics Concern  . Not on file  Social History Narrative   Tobacco Use Cigarettes: Never Smoked   No Alcohol   Caffeine: Yes   No recreational drug use   Marital Status: Widowed           Review of Systems: Unobtainable from the patient, who is confused and has slurred speech, but I did obtain some basic information over the telephone from his daughter-in-law.  Physical Exam: Vital signs in last 24 hours: Temp:  [97.4 F (36.3 C)-98.4 F (36.9 C)] 97.4 F (36.3 C) (07/20 0442) Pulse Rate:  [58-78] 74 (07/20 0442) Resp:  [18-20] 20 (07/20 0442) BP: (117-149)/(45-132) 149/67 (07/20 0442) SpO2:  [99 %-100 %] 99 % (07/20 0442) Weight:  [109.5 kg] 109.5 kg (07/20 0442) Last BM Date: 06/05/19   This is a well-developed and well-nourished Caucasian male wearing mitts, because of a tendency to try to pull out his NG tube.  He is somewhat restless in bed but not in any distress.  He is awake and alert but does not seem to follow commands (apparently is hard of hearing) and his speech is slurred although somewhat appropriate (to the effect that he wants the tube out).  Heart sounds are too distant to appreciate, chest is grossly clear, abdomen slightly adipose but quiet (when NG tube kinked shut),, no mass-effect or tenderness.  No peripheral edema, no obvious focal neurologic deficit, no pallor or icterus.  Intake/Output from previous day: 07/19 0701 - 07/20 0700 In: 1223.7 [I.V.:1223.7] Out: 1400 [Emesis/NG output:1400] Intake/Output this shift: No intake/output data recorded.  Lab Results: Recent Labs    06/09/19 0602  WBC 10.3  HGB 10.8*  HCT 36.6*  PLT 251   BMET Recent Labs    06/07/19 0659 06/08/19 0615 06/09/19 0602  NA 139 142 144  K 3.6  3.7 3.3*  CL 93* 96* 102  CO2 34* 39* 36*  GLUCOSE 175* 161* 78  BUN 35* 34* 27*  CREATININE 1.78* 1.50* 1.37*  CALCIUM 9.6 9.6 9.5   LFT Recent Labs    06/09/19 0602  PROT 6.1*  ALBUMIN 3.0*  AST 29  ALT 21  ALKPHOS 56  BILITOT 0.5   PT/INR No results for input(s): LABPROT, INR in the last 72 hours.  Studies/Results: Ct Abdomen Pelvis Wo Contrast  Result Date: 06/08/2019 CLINICAL DATA:  Abdominal distension. Gastric outlet obstruction, new since 06/02/2019. Nausea and abdominal cramping pain. History of prostate cancer treated with radiation therapy. EXAM: CT ABDOMEN AND PELVIS WITHOUT CONTRAST TECHNIQUE: Multidetector CT imaging of the abdomen and pelvis was performed following the standard protocol without IV contrast. COMPARISON:  06/02/2019 and 04/04/2013. FINDINGS: Lower chest: Mitral valve annular calcifications and minimal coronary  artery calcification. Minimal bibasilar atelectasis with improvement. Hepatobiliary: No focal liver abnormality is seen. No gallstones, gallbladder wall thickening, or biliary dilatation. Pancreas: Unremarkable. No pancreatic ductal dilatation or surrounding inflammatory changes. The small cystic area seen in the pancreatic body on 04/04/2013 is not well visualized without intravenous contrast today, grossly unchanged. Spleen: Normal in size without focal abnormality. Adrenals/Urinary Tract: Normal appearing adrenal glands. Stable right renal cysts with a tiny calcification in an upper pole cyst. Unremarkable left kidney, ureters and urinary bladder. Stomach/Bowel: Interval nasogastric tube with its tip in the mid to distal stomach. Normal appearing pylorus. No obstructing mass seen. Redundant sigmoid colon with minimal proximal diverticulosis. No evidence of diverticulitis. Unremarkable small bowel and appendix. Vascular/Lymphatic: Atheromatous arterial calcifications without aneurysm. Single enlarged right para-aortic lymph node inferior to the level of  the renal arteries with a short axis diameter of 14 mm on image number 41 series 2 this has not changed significantly since 04/04/2013, compatible with a benign process, most likely chronic reactive. No other enlarged lymph nodes. Reproductive: Normal sized prostate gland with adjacent surgical clips or radiation seeds. Other: Small to moderate-sized right inguinal hernia containing fat and small left inguinal hernia containing fat without significant change since 04/04/2013. Musculoskeletal: Lumbar and lower thoracic spine degenerative changes and mild scoliosis. Straightening of the normal lumbar lordosis. No evidence of bony metastatic disease. IMPRESSION: No acute abnormality. Electronically Signed   By: Claudie Revering M.D.   On: 06/08/2019 17:14   Dg Abd Portable 1v  Result Date: 06/08/2019 CLINICAL DATA:  Abdominal distension and pain. EXAM: PORTABLE ABDOMEN - 1 VIEW COMPARISON:  CT 06/02/2019 and KUB 01/07/2017 FINDINGS: There is extensive gaseous distention of the stomach new since 06/02/2019. Air is present throughout the colon. No free peritoneal air. Remainder of the exam is unchanged. IMPRESSION: Extensive gaseous distention of the stomach new since 06/02/2019 which may be due to gastric outlet obstruction. Electronically Signed   By: Marin Olp M.D.   On: 06/08/2019 13:29    Impression: New onset gastric distention.  Etiologies would include an anatomic issue such as pyloric channel ulceration or tumor causing mechanical gastric outlet obstruction, or, given the abrupt onset, perhaps a functional impairment of gastric motility.  Plan: I would favor endoscopic evaluation to look for, and hopefully exclude, a mechanical cause for the gastric outlet obstruction.  If none is found, then supportive care with NG suction for several days until the patient's stomach muscles regain their tone, at which time the NG tube could be clamped and gradual progression from a liquid diet to a solid diet could  hopefully be achieved.  I do not think the patient can give a valid consent at this time in view of confusion and communication difficulties.  I did speak with his daughter-in-law, [name], at some length on the phone today regarding the patient's condition and the nature, purpose, and risks of endoscopic evaluation (rare mortality; possible perforation, bleeding, or anesthesia problems approximately 1% risk), and she is agreeable and apparently is the one whom the patient's son looks to for medical decision making.  I have left a message for the patient's son to call me when he gets back home this afternoon so I can further discuss things.  We have the patient tentatively scheduled for endoscopic evaluation at 1:30 PM tomorrow pending approval from the patient's family members.   LOS: 6 days   [place]  06/09/2019, 12:58 PM   Pager [phone] If no answer or after 5  PM call [phone]

## 2019-06-09 NOTE — Care Management Important Message (Signed)
Important Message  Patient Details IM Letter given to Nancy Marus RN to present to the Patient Name: Jared Hall MRN: [id number] Date of Birth: April 20, 1931   Medicare Important Message Given:  Yes     Kerin Salen 06/09/2019, 10:32 AM

## 2019-06-09 NOTE — TOC Progression Note (Signed)
Transition of Care [facility]) - Progression Note    Patient Details  Name: DEVYNN SCHEFF MRN: [id number] Date of Birth: 10/07/1931  Transition of Care [facility]) CM/SW [place], [place] Phone Number: [phone] coverage for [phone] 06/09/2019, 1:59 PM  Clinical Narrative:   Pt's daughter-in-law [name] called CSW to advise that SNF choice is [facility] as pt has been there several times in the past for rehab (last time in 2018).  States generally pt is able to transfer from bed to chair independently at his ALF [facility]) and goal of SNF would be to gain strength in transfers. Will notify [place].        Expected Discharge Plan and Services           Expected Discharge Date: TBD                                  Social Determinants of Health (SDOH) Interventions    Readmission Risk Interventions Readmission Risk Prevention Plan 06/09/2019  Transportation Screening Complete  PCP or Specialist Appt within 3-5 Days Not Complete  Not Complete comments not ready for d/c, plan to go to SNF  [place] or [place] Complete  Social Work Consult for [place] Planning/Counseling Complete  Palliative Care Screening Not Applicable  Medication Review Press photographer) Complete  Some recent data might be hidden

## 2019-06-09 NOTE — TOC Progression Note (Signed)
Transition of Care [facility]) - Progression Note    Patient Details  Name: Jared Hall MRN: [id number] Date of Birth: 30-May-1931  Transition of Care [place]) CM/SW Contact  Joaquin Courts, RN Phone Number: 06/09/2019, 12:00 PM  Clinical Narrative: CM spoke with patient at bedside who requests CM speak with son. CM called son to present bed offers, no answer, voicemail left.            Expected Discharge Plan and Services           Expected Discharge Date: 06/09/19                                     Social Determinants of Health (SDOH) Interventions    Readmission Risk Interventions Readmission Risk Prevention Plan 06/09/2019  Transportation Screening Complete  PCP or Specialist Appt within 3-5 Days Not Complete  Not Complete comments not ready for d/c, plan to go to SNF  [place] or [place] Complete  Social Work Consult for [place] Planning/Counseling Complete  Palliative Care Screening Not Applicable  Medication Review Press photographer) Complete  Some recent data might be hidden

## 2019-06-09 NOTE — Progress Notes (Signed)
PROGRESS NOTE    Jared Hall  ZOX:[id number] DOB: September 24, 1931 DOA: 06/02/2019 PCP: Josetta Huddle, MD    Brief Narrative:  85 y.o.male,w Anemia, Thallassemia, hypertension, hyperlipidemia, Dm2, h/o Stroke, Pafib, CHF (EF 55%), OSA, Bph/ prostate cancer s/p XRT apparently present with c/o nausea as well as abdominal discomfort "cramping". Pt denies fever, chills, cough, cp, palp, constipation, diarrhea, brbpr, black stool.   Assessment & Plan:   Principal Problem:   Hypokalemia Active Problems:   DM (diabetes mellitus) ([place])   History of CVA (cerebrovascular accident)   Essential hypertension, benign   GERD (gastroesophageal reflux disease)   DM type 2 with diabetic peripheral neuropathy (HCC)   Paroxysmal atrial fibrillation (HCC)   Chronic kidney disease, stage III (moderate) (HCC)   Anemia  Hypokalemia likely secondary to diuretics (lasix, metolazone)     - Consider endocrinology referral as outpatient when stable to r/o Conns syndrome, Cushings, or CAH      - MG2+ was ok, monitor     - K low this AM at 3.3. Will replace     - Repeat bmet in AM  Pafib , chronic diastolic CHF (EF 81%)     - Presently stable at this AM     - clinically dehydrated, thus holding further diuretic     - Continued on gentle IVF hydration    - Pt now NPO, see below. Continued on IV beta blocker and held plavix. Will continue PR aspirin until pt is able to tolerate PO  DM2 w CKD stage3     - Now NPO per below     - Hypoglycemic overnight. Have held lantus    - Continue on Q4h SSI coverage    - Will continue patient on D5 fluids  Diabetic neuropathy, Chronic pain     - Seems to be stable currently  Hyperlipidemia, h/o CVA     - Simvastatin 20mg  po qhs on hold as pt is now NPO per below     - Cont aspirin, plavix on hold as pt is NPO    -  Currently stable  Gerd     - Continue on IV PPI   BPH     - seems to be stable  Constipation     - Recently with good results from soap  suds enema     - Recent CT abd/pelvis from 7/19 personally reviewed. Stool noted predominately in ascending and transverse colon  Anemia, Thallasemia     - Remains hemodynamically stable  ARF     - Cr improved with IVF overnight     - Dry membranes on exam    -  Holding diuretic per above and will continue gentle IVF hydration     - recheck bmet in AM  Gastric outlet obstruction      - Follow up abd xray on 7/19 with findings worrisome for newly distended stomach concerning for gastric outlet obstruction    - NG was placed with nearly 1.5L output overnight    -Repeat CT abd/pelvis personally reviewed with no acute findings noted. Stool in ascending and transverse colons per my own read    -Appreciate input by GI. Recommendation for endoscopy 7/21 to rule out mechanical obstruction. If neg, then plan for bowel rest until motility improves  DVT prophylaxis: Lovenox subQ Code Status: DNR Family Communication: Pt in room, family not at bedside Disposition Plan: return to SNF when stable  Consultants:   GI  Procedures:     Antimicrobials: Anti-infectives (  From admission, onward)   None      Subjective: More alert today. Asking for soda  Objective: Vitals:   06/08/19 1355 06/08/19 2032 06/08/19 2143 06/09/19 0442  BP: (!) 120/45 (!) 144/132 (!) 117/52 (!) 149/67  Pulse: 78 (!) 58 68 74  Resp: 18 20  20   Temp: 98.4 F (36.9 C) 98.1 F (36.7 C)  (!) 97.4 F (36.3 C)  TempSrc: Oral Oral  Oral  SpO2: 99% 100%  99%  Weight:    109.5 kg  Height:        Intake/Output Summary (Last 24 hours) at 06/09/2019 1654 Last data filed at 06/09/2019 0600 Gross per 24 hour  Intake 1223.65 ml  Output 500 ml  Net 723.65 ml   Filed Weights   06/05/19 0550 06/08/19 0532 06/09/19 0442  Weight: 109 kg 113 kg 109.5 kg    Examination: General exam: Awake, laying in bed, in nad, NG in place Respiratory system: Normal respiratory effort, no wheezing Cardiovascular system: regular  rate, s1, s2 Gastrointestinal system: Soft, nondistended, decreased BS with NG in place Central nervous system: CN2-12 grossly intact, strength intact Extremities: Perfused, no clubbing Skin: Normal skin turgor, no notable skin lesions seen Psychiatry: Difficult to assess given current mentation  Data Reviewed: I have personally reviewed following labs and imaging studies  CBC: Recent Labs  Lab 06/03/19 0847 06/04/19 0549 06/09/19 0602  WBC 9.2 9.3 10.3  NEUTROABS  --  6.1  --   HGB 10.3* 10.5* 10.8*  HCT 34.7* 36.6* 36.6*  MCV 72.1* 73.2* 73.2*  PLT 237 235 867   Basic Metabolic Panel: Recent Labs  Lab 06/02/19 2145  06/04/19 0549 06/05/19 0500 06/06/19 0539 06/07/19 0659 06/08/19 0615 06/09/19 0602  NA  --    < > 143 140 141 139 142 144  K  --    < > 3.1* 3.2* 4.0 3.6 3.7 3.3*  CL  --    < > 94* 93* 96* 93* 96* 102  CO2  --    < > 37* 36* 31 34* 39* 36*  GLUCOSE  --    < > 94 56* 179* 175* 161* 78  BUN  --    < > 18 18 21  35* 34* 27*  CREATININE  --    < > 1.44* 1.46* 1.49* 1.78* 1.50* 1.37*  CALCIUM  --    < > 9.4 9.4 9.7 9.6 9.6 9.5  MG 1.7  --  1.7  --   --   --   --  1.9  PHOS  --   --  2.3*  --   --   --   --   --    < > = values in this interval not displayed.   GFR: Estimated Creatinine Clearance: 48 mL/min (A) (by C-G formula based on SCr of 1.37 mg/dL (H)). Liver Function Tests: Recent Labs  Lab 06/03/19 0640 06/03/19 0847 06/04/19 0549 06/06/19 0539 06/08/19 0615 06/09/19 0602  AST QUESTIONABLE RESULTS, RECOMMEND RECOLLECT TO VERIFY 23  --  27 24 29   ALT QUESTIONABLE RESULTS, RECOMMEND RECOLLECT TO VERIFY 16  --  15 17 21   ALKPHOS QUESTIONABLE RESULTS, RECOMMEND RECOLLECT TO VERIFY 64  --  63 57 56  BILITOT QUESTIONABLE RESULTS, RECOMMEND RECOLLECT TO VERIFY 0.9  --  0.6 0.3 0.5  PROT QUESTIONABLE RESULTS, RECOMMEND RECOLLECT TO VERIFY 6.6  --  7.2 6.1* 6.1*  ALBUMIN QUESTIONABLE RESULTS, RECOMMEND RECOLLECT TO VERIFY 3.4* 3.5 3.2* 3.0* 3.0*  No results for input(s): LIPASE, AMYLASE in the last 168 hours. No results for input(s): AMMONIA in the last 168 hours. Coagulation Profile: No results for input(s): INR, PROTIME in the last 168 hours. Cardiac Enzymes: No results for input(s): CKTOTAL, CKMB, CKMBINDEX, TROPONINI in the last 168 hours. BNP (last 3 results) No results for input(s): PROBNP in the last 8760 hours. HbA1C: Recent Labs    06/08/19 0615  HGBA1C 8.2*   CBG: Recent Labs  Lab 06/08/19 2028 06/08/19 2315 06/09/19 0439 06/09/19 0809 06/09/19 1215  GLUCAP 60* 74 65* 67* 89   Lipid Profile: No results for input(s): CHOL, HDL, LDLCALC, TRIG, CHOLHDL, LDLDIRECT in the last 72 hours. Thyroid Function Tests: No results for input(s): TSH, T4TOTAL, FREET4, T3FREE, THYROIDAB in the last 72 hours. Anemia Panel: No results for input(s): VITAMINB12, FOLATE, FERRITIN, TIBC, IRON, RETICCTPCT in the last 72 hours. Sepsis Labs: No results for input(s): PROCALCITON, LATICACIDVEN in the last 168 hours.  Recent Results (from the past 240 hour(s))  SARS Coronavirus 2 (CEPHEID - Performed in [place] hospital lab), Hosp Order     Status: None   Collection Time: 06/02/19  8:17 PM   Specimen: Nasopharyngeal Swab  Result Value Ref Range Status   SARS Coronavirus 2 NEGATIVE NEGATIVE Final    Comment: (NOTE) If result is NEGATIVE SARS-CoV-2 target nucleic acids are NOT DETECTED. The SARS-CoV-2 RNA is generally detectable in upper and lower  respiratory specimens during the acute phase of infection. The lowest  concentration of SARS-CoV-2 viral copies this assay can detect is 250  copies / mL. A negative result does not preclude SARS-CoV-2 infection  and should not be used as the sole basis for treatment or other  patient management decisions.  A negative result may occur with  improper specimen collection / handling, submission of specimen other  than nasopharyngeal swab, presence of viral mutation(s) within the   areas targeted by this assay, and inadequate number of viral copies  (<250 copies / mL). A negative result must be combined with clinical  observations, patient history, and epidemiological information. If result is POSITIVE SARS-CoV-2 target nucleic acids are DETECTED. The SARS-CoV-2 RNA is generally detectable in upper and lower  respiratory specimens dur ing the acute phase of infection.  Positive  results are indicative of active infection with SARS-CoV-2.  Clinical  correlation with patient history and other diagnostic information is  necessary to determine patient infection status.  Positive results do  not rule out bacterial infection or co-infection with other viruses. If result is PRESUMPTIVE POSTIVE SARS-CoV-2 nucleic acids MAY BE PRESENT.   A presumptive positive result was obtained on the submitted specimen  and confirmed on repeat testing.  While 2019 novel coronavirus  (SARS-CoV-2) nucleic acids may be present in the submitted sample  additional confirmatory testing may be necessary for epidemiological  and / or clinical management purposes  to differentiate between  SARS-CoV-2 and other Sarbecovirus currently known to infect humans.  If clinically indicated additional testing with an alternate test  methodology [phone]) is advised. The SARS-CoV-2 RNA is generally  detectable in upper and lower respiratory sp ecimens during the acute  phase of infection. The expected result is Negative. Fact Sheet for Patients:  StrictlyIdeas.no Fact Sheet for Healthcare Providers: BankingDealers.co.za This test is not yet approved or cleared by the [place] FDA and has been authorized for detection and/or diagnosis of SARS-CoV-2 by FDA under an Emergency Use Authorization (EUA).  This EUA will remain in effect (meaning  this test can be used) for the duration of the COVID-19 declaration under Section 564(b)(1) of the Act, 21 U.S.C.  section 360bbb-3(b)(1), unless the authorization is terminated or revoked sooner. Performed at [facility], [place] [address] [place], [place] [address]   MRSA PCR Screening     Status: Abnormal   Collection Time: 06/03/19  6:16 PM   Specimen: Nasopharyngeal  Result Value Ref Range Status   MRSA by PCR POSITIVE (A) NEGATIVE Final    Comment:        The GeneXpert MRSA Assay (FDA approved for NASAL specimens only), is one component of a comprehensive MRSA colonization surveillance program. It is not intended to diagnose MRSA infection nor to guide or monitor treatment for MRSA infections. RESULT CALLED TO, READ BACK BY AND VERIFIED WITHMargarita Grizzle RN [id number] 06/04/19 A NAVARRO Performed at [facility], [place] [address] [place],  [address]      Radiology Studies: Ct Abdomen Pelvis Wo Contrast  Result Date: 06/08/2019 CLINICAL DATA:  Abdominal distension. Gastric outlet obstruction, new since 06/02/2019. Nausea and abdominal cramping pain. History of prostate cancer treated with radiation therapy. EXAM: CT ABDOMEN AND PELVIS WITHOUT CONTRAST TECHNIQUE: Multidetector CT imaging of the abdomen and pelvis was performed following the standard protocol without IV contrast. COMPARISON:  06/02/2019 and 04/04/2013. FINDINGS: Lower chest: Mitral valve annular calcifications and minimal coronary artery calcification. Minimal bibasilar atelectasis with improvement. Hepatobiliary: No focal liver abnormality is seen. No gallstones, gallbladder wall thickening, or biliary dilatation. Pancreas: Unremarkable. No pancreatic ductal dilatation or surrounding inflammatory changes. The small cystic area seen in the pancreatic body on 04/04/2013 is not well visualized without intravenous contrast today, grossly unchanged. Spleen: Normal in size without focal abnormality. Adrenals/Urinary Tract: Normal appearing adrenal glands. Stable right renal cysts with a tiny  calcification in an upper pole cyst. Unremarkable left kidney, ureters and urinary bladder. Stomach/Bowel: Interval nasogastric tube with its tip in the mid to distal stomach. Normal appearing pylorus. No obstructing mass seen. Redundant sigmoid colon with minimal proximal diverticulosis. No evidence of diverticulitis. Unremarkable small bowel and appendix. Vascular/Lymphatic: Atheromatous arterial calcifications without aneurysm. Single enlarged right para-aortic lymph node inferior to the level of the renal arteries with a short axis diameter of 14 mm on image number 41 series 2 this has not changed significantly since 04/04/2013, compatible with a benign process, most likely chronic reactive. No other enlarged lymph nodes. Reproductive: Normal sized prostate gland with adjacent surgical clips or radiation seeds. Other: Small to moderate-sized right inguinal hernia containing fat and small left inguinal hernia containing fat without significant change since 04/04/2013. Musculoskeletal: Lumbar and lower thoracic spine degenerative changes and mild scoliosis. Straightening of the normal lumbar lordosis. No evidence of bony metastatic disease. IMPRESSION: No acute abnormality. Electronically Signed   By: Claudie Revering M.D.   On: 06/08/2019 17:14   Dg Abd Portable 1v  Result Date: 06/08/2019 CLINICAL DATA:  Abdominal distension and pain. EXAM: PORTABLE ABDOMEN - 1 VIEW COMPARISON:  CT 06/02/2019 and KUB 01/07/2017 FINDINGS: There is extensive gaseous distention of the stomach new since 06/02/2019. Air is present throughout the colon. No free peritoneal air. Remainder of the exam is unchanged. IMPRESSION: Extensive gaseous distention of the stomach new since 06/02/2019 which may be due to gastric outlet obstruction. Electronically Signed   By: Marin Olp M.D.   On: 06/08/2019 13:29    Scheduled Meds: . aspirin  300 mg Rectal Daily  . Chlorhexidine Gluconate Cloth  6  each Topical Daily  . enoxaparin  (LOVENOX) injection  0.5 mg/kg Subcutaneous QHS  . insulin aspart  0-15 Units Subcutaneous Q4H  . metoprolol tartrate  5 mg Intravenous Q6H  . mupirocin ointment   Nasal BID  . pantoprazole (PROTONIX) IV  40 mg Intravenous Q24H  . sodium chloride flush  3 mL Intravenous Q12H   Continuous Infusions: . sodium chloride    . dextrose 5 % and 0.9% NaCl 100 mL/hr at 06/09/19 1422     LOS: 6 days   Marylu Lund, MD [facility] Pager On Amion  If 7PM-7AM, please contact night-coverage 06/09/2019, 4:54 PM

## 2019-06-10 ENCOUNTER — Inpatient Hospital Stay (HOSPITAL_COMMUNITY): Payer: Medicare Other

## 2019-06-10 ENCOUNTER — Encounter (HOSPITAL_COMMUNITY): Payer: Self-pay | Admitting: *Deleted

## 2019-06-10 ENCOUNTER — Inpatient Hospital Stay (HOSPITAL_COMMUNITY): Payer: Medicare Other | Admitting: Anesthesiology

## 2019-06-10 ENCOUNTER — Encounter (HOSPITAL_COMMUNITY)
Admission: EM | Disposition: A | Discharge status: Skilled Nursing Facility | Payer: Self-pay | Source: Home / Self Care | Attending: Internal Medicine

## 2019-06-10 HISTORY — PX: ESOPHAGOGASTRODUODENOSCOPY (EGD) WITH PROPOFOL: SHX5813

## 2019-06-10 LAB — BASIC METABOLIC PANEL
Anion gap: 8 (ref 5–15)
BUN: 21 mg/dL (ref 8–23)
CO2: 28 mmol/L (ref 22–32)
Calcium: 8.9 mg/dL (ref 8.9–10.3)
Chloride: 106 mmol/L (ref 98–111)
Creatinine, Ser: 1.32 mg/dL — ABNORMAL HIGH (ref 0.61–1.24)
GFR calc Af Amer: 56 mL/min — ABNORMAL LOW (ref >60)
GFR calc non Af Amer: 49 mL/min — ABNORMAL LOW (ref >60)
Glucose, Bld: 104 mg/dL — ABNORMAL HIGH (ref 70–99)
Potassium: 4 mmol/L (ref 3.5–5.1)
Sodium: 142 mmol/L (ref 135–145)

## 2019-06-10 LAB — GLUCOSE, CAPILLARY
Glucose-Capillary: 101 mg/dL — ABNORMAL HIGH (ref 70–99)
Glucose-Capillary: 109 mg/dL — ABNORMAL HIGH (ref 70–99)
Glucose-Capillary: 132 mg/dL — ABNORMAL HIGH (ref 70–99)
Glucose-Capillary: 173 mg/dL — ABNORMAL HIGH (ref 70–99)
Glucose-Capillary: 80 mg/dL (ref 70–99)
Glucose-Capillary: 91 mg/dL (ref 70–99)

## 2019-06-10 LAB — NOVEL CORONAVIRUS, NAA (HOSP ORDER, SEND-OUT TO REF LAB; TAT 18-24 HRS): SARS-CoV-2, NAA: NOT DETECTED

## 2019-06-10 SURGERY — ESOPHAGOGASTRODUODENOSCOPY (EGD) WITH PROPOFOL
Anesthesia: Monitor Anesthesia Care

## 2019-06-10 MED ORDER — LACTATED RINGERS IV SOLN
INTRAVENOUS | Status: DC
  Administered 2019-06-10: 1000 mL via INTRAVENOUS

## 2019-06-10 MED ORDER — INSULIN GLARGINE 100 UNIT/ML ~~LOC~~ SOLN
20.0000 [IU] | Freq: Every day | SUBCUTANEOUS | Status: DC
  Administered 2019-06-10 – 2019-06-12 (×3): 20 [IU] via SUBCUTANEOUS
  Filled 2019-06-10 (×3): qty 0.2

## 2019-06-10 MED ORDER — LACTATED RINGERS IV SOLN
INTRAVENOUS | Status: DC
  Administered 2019-06-10 – 2019-06-12 (×4): via INTRAVENOUS

## 2019-06-10 MED ORDER — SODIUM CHLORIDE 0.9 % IV SOLN: INTRAVENOUS | Status: DC

## 2019-06-10 MED ORDER — PROPOFOL 500 MG/50ML IV EMUL
INTRAVENOUS | Status: DC | PRN
  Administered 2019-06-10 (×2): 30 mg via INTRAVENOUS

## 2019-06-10 MED ORDER — PROPOFOL 500 MG/50ML IV EMUL
INTRAVENOUS | Status: DC | PRN
  Administered 2019-06-10: 150 ug/kg/min via INTRAVENOUS

## 2019-06-10 SURGICAL SUPPLY — 15 items

## 2019-06-10 NOTE — Op Note (Signed)
[facility] Patient Name: Jared Hall Procedure Date: 06/10/2019 MRN: [id number] Attending MD: Ronald Lobo , MD Date of Birth: 1931-06-21 CSN: [id number] Age: 85 Admit Type: Inpatient Procedure:                Upper GI endoscopy Indications:              Abnormal abdominal x-ray of the GI tract, with KUB                            showing marked gastric dilatation, acute, in a                            diabetic patient who came in with abdominal cramps,                            and hypokalemia which has been corrected. R/O                            anatomic GOO. Providers:                Ronald Lobo, MD, Vista Lawman, RN, Ashley Jacobs, RN, Ladona Ridgel, Technician Referring MD:              Medicines:                Monitored Anesthesia Care Complications:            No immediate complications. Estimated Blood Loss:     Estimated blood loss: none. Estimated blood loss:                            none. Procedure:                After obtaining informed consent, the endoscope was                            passed under direct vision. Throughout the                            procedure, the patient's blood pressure, pulse, and                            oxygen saturations were monitored continuously. The                            GIF-H190 ([id number] Olympus gastroscope was                            introduced through the mouth, alongside his NG                            tube, and advanced to the second part of duodenum.  The upper GI endoscopy was accomplished without                            difficulty. The patient tolerated the procedure                            fairly well, developing some hypoxemia (85% O2                            saturation, without overt clinical instability);                            this responded to withdrawl of the endoscope and                            bagging the  patient. Scope In: Scope Out: Findings:      The examined esophagus was grossly normal, being partially obscured by       the presence of the NG tube.      The entire examined stomach was normal. Specifically, there was no       evidence of any distal mass or tumor, nor of hypertrophic pyloric       stenosis, nor ulcer disease with edema or scarring of the pyloric       channel.      The examined duodenum was normal.      Retroflexion was not performed, due to the need to terminate the       procedure because of hypoxemia, so the cardia and fundus were not       optimally seen. Impression:               - Normal esophagus.                           - Grossly normal stomach, with specifically normal                            gastric outlet.                           - Normal examined duodenum.                           - No specimens collected. Moderate Sedation:      This patient was sedated with monitored anesthesia care, not moderate       sedation. Recommendation:           - Continue present medications.                           - Remove NG tube and observe.                           - Clear liquid diet today, then advance as                            tolerated to advance diet as tolerated.                           -  F/U KUB tomorrow. Procedure Code(s):        --- Professional ---                           [phone], Esophagogastroduodenoscopy, flexible,                            transoral; diagnostic, including collection of                            specimen(s) by brushing or washing, when performed                            (separate procedure) Diagnosis Code(s):        --- Professional ---                           R93.3, Abnormal findings on diagnostic imaging of                            other parts of digestive tract CPT copyright 2019 American Medical Association. All rights reserved. The codes documented in this report are preliminary and upon coder review may   be revised to meet current compliance requirements. Ronald Lobo, MD 06/10/2019 3:04:06 PM This report has been signed electronically. Number of Addenda: 0

## 2019-06-10 NOTE — Progress Notes (Signed)
Patient's EGD showed no evidence of gastric tumor or gastric outlet obstruction.    Procedure was somewhat abbreviated due to patient developing hypoxemia during the procedure, which responded to removal of the scope and the patient being bagged.  Plan: Given the absence of any anatomic gastric outlet obstruction, we have removed the patient's NG tube, which became partially dislodged during removal of the scope.  I will allow the patient a clear liquid diet and do a follow-up KUB tomorrow.  Jared Hall, M.D. Pager [phone] If no answer or after 5 PM call [phone]

## 2019-06-10 NOTE — Progress Notes (Signed)
Physical Therapy Treatment Patient Details Name: Jared Hall MRN: [id number] DOB: [birth year] Today's Date: 06/10/2019    History of Present Illness 85 y.o. male, w Anemia, Thallassemia, hypertension, hyperlipidemia, Dm2, h/o Stroke, Pafib, CHF (EF 55%),  OSA, Bph/ prostate cancer s/p XRT apparently present with c/o nausea as well as abdominal discomfort "cramping".    PT Comments    Patient progressing slowly due to medical complications now with NGT and further endoscopic work up.  PT to continue to follow acutely and pt remains most appropriate for STSNF unless ALF able to provide assist for OOB transfers.     Follow Up Recommendations  SNF;Supervision for mobility/OOB     Equipment Recommendations  None recommended by PT    Recommendations for Other Services       Precautions / Restrictions Precautions Precautions: Fall Precaution Comments: NGT to suction    Mobility  Bed Mobility Overal bed mobility: Needs Assistance Bed Mobility: Supine to Sit;Sit to Supine     Supine to sit: Min assist Sit to supine: Mod assist;+2 for safety/equipment   General bed mobility comments: lifting trunk for up to EOB; assist for legs into bed to supine and mod A to roll to change linens due to wet sheets  Transfers Overall transfer level: Needs assistance   Transfers: Lateral/Scoot Transfers          Lateral/Scoot Transfers: Min assist General transfer comment: plan for up to recliner, but RN requests back to bed due to going to Endoscopy soon, so pt scooted up to [facility] with min a and increased time  Ambulation/Gait                 Stairs             Wheelchair Mobility    Modified Rankin (Stroke Patients Only)       Balance Overall balance assessment: Needs assistance   Sitting balance-Leahy Scale: Good Sitting balance - Comments: seated EOB and scooting to [facility]                                    Cognition Arousal/Alertness:  Awake/alert Behavior During Therapy: Agitated Overall Cognitive Status: No family/caregiver present to determine baseline cognitive functioning                                 General Comments: yelling out "orange juice"; repeatedly asking for orange juice or pepsi with ice despite redirection of being NPO with NG tube due to obstruction      Exercises      General Comments        Pertinent Vitals/Pain Pain Assessment: No/denies pain    Home Living                      Prior Function            PT Goals (current goals can now be found in the care plan section) Progress towards PT goals: Not progressing toward goals - comment(limited progress due to new NGT to suction and going for procedure)    Frequency    Min 2X/week      PT Plan Current plan remains appropriate    Co-evaluation              AM-PAC PT "6 Clicks" Mobility   Outcome Measure  Help needed  turning from your back to your side while in a flat bed without using bedrails?: A Lot Help needed moving from lying on your back to sitting on the side of a flat bed without using bedrails?: A Little Help needed moving to and from a bed to a chair (including a wheelchair)?: A Lot Help needed standing up from a chair using your arms (e.g., wheelchair or bedside chair)?: Total Help needed to walk in hospital room?: Total Help needed climbing 3-5 steps with a railing? : Total 6 Click Score: 10    End of Session   Activity Tolerance: Other (comment)(limited due to going for procedure) Patient left: in bed;with call bell/phone within reach   PT Visit Diagnosis: Muscle weakness (generalized) (M62.81);Other abnormalities of gait and mobility (R26.89)     Time: 1779-3903 PT Time Calculation (min) (ACUTE ONLY): 19 min  Charges:  $Therapeutic Activity: 8-22 mins                     Magda Kiel, [place] [facility] [phone] 06/10/2019    Reginia Naas 06/10/2019,  1:57 PM

## 2019-06-10 NOTE — Progress Notes (Signed)
PROGRESS NOTE    Jared Hall  WVP:[id number] DOB: November 19, 1931 DOA: 06/02/2019 PCP: Josetta Huddle, MD    Brief Narrative:  85 y.o.male,w Anemia, Thallassemia, hypertension, hyperlipidemia, Dm2, h/o Stroke, Pafib, CHF (EF 55%), OSA, Bph/ prostate cancer s/p XRT apparently present with c/o nausea as well as abdominal discomfort "cramping". Pt denies fever, chills, cough, cp, palp, constipation, diarrhea, brbpr, black stool.   Assessment & Plan:   Principal Problem:   Hypokalemia Active Problems:   DM (diabetes mellitus) ([place])   History of CVA (cerebrovascular accident)   Essential hypertension, benign   GERD (gastroesophageal reflux disease)   DM type 2 with diabetic peripheral neuropathy (HCC)   Paroxysmal atrial fibrillation (HCC)   Chronic kidney disease, stage III (moderate) (HCC)   Anemia  Hypokalemia likely secondary to diuretics (lasix, metolazone)     - Potassium replaced     - Will repeat bmet in AM and cont to replace as needed  Pafib , chronic diastolic CHF (EF 54%)     - Presently stable at this AM     - clinically dehydrated, thus holding further diuretic     - Continued on gentle IVF hydration    - Pt is continued on IV beta blocker and held plavix. Will continue PR aspirin until pt is able to reliably tolerate PO  DM2 w CKD stage3     -Pt had been NPO per below, now on clears post-procedure per GI     - Will continue lantus at 20 units daily , home dose is 50 units BID    - Continue on Q4h SSI coverage    - continued on gentle IVF hydration  Diabetic neuropathy, Chronic pain     - Seems to be stable currently  Hyperlipidemia, h/o CVA     - Simvastatin 20mg  po qhs on hold as pt was NPO per below     - Cont on aspirin, plavix was on hold as pt is NPO. Will resume if pt tolerates PO reliably    -  Currently stable  Gerd     - Continue on IV PPI   BPH     - seems to be stable  Constipation     - Recently with good results from soap suds  enema     - Recent CT abd/pelvis from 7/19 personally reviewed. Stool noted predominately in ascending and transverse colon  Anemia, Thallasemia     - Remains hemodynamically stable  ARF     - Cr has improved with IVF hydration     - Dry membranes on exam    -  Cont to hold diuretic per above and will continue gentle IVF hydration     - repeat bmet in AM  Gastric outlet obstruction      - Follow up abd xray on 7/19 with findings worrisome for newly distended stomach concerning for gastric outlet obstruction    - NG was placed with nearly 1.5L output overnight    -Repeat CT abd/pelvis personally reviewed with no acute findings noted. Stool in ascending and transverse colons per my own read    -Appreciate input by GI. Pt has since undergone EGD on 7/21 without findings of mass or lesion to explain obstruction. Started trial of clears with NG since removed  DVT prophylaxis: Lovenox subQ Code Status: DNR Family Communication: Pt in room, family not at bedside Disposition Plan: return to SNF when stable  Consultants:   GI  Procedures:     Antimicrobials:  Anti-infectives (From admission, onward)   None      Subjective: Wanting soda when seen with NG still in place  Objective: Vitals:   06/09/19 2308 06/10/19 0519 06/10/19 1256 06/10/19 1440  BP: (!) 176/74 (!) 155/82 (!) 186/60 (!) 122/45  Pulse:  66 (!) 58 74  Resp:  18 18 16   Temp:  98.6 F (37 C) 97.7 F (36.5 C) (!) 96.6 F (35.9 C)  TempSrc:  Oral Temporal Temporal  SpO2:  100% 100% 98%  Weight:  108.1 kg 108.1 kg   Height:   5\' 10"  (1.778 m)     Intake/Output Summary (Last 24 hours) at 06/10/2019 1447 Last data filed at 06/10/2019 1434 Gross per 24 hour  Intake 400 ml  Output 550 ml  Net -150 ml   Filed Weights   06/09/19 0442 06/10/19 0519 06/10/19 1256  Weight: 109.5 kg 108.1 kg 108.1 kg    Examination: General exam: Conversant, in no acute distress, NG tube in place this AM Respiratory system:  normal chest rise, clear, no audible wheezing Cardiovascular system: regular rhythm, s1-s2 Gastrointestinal system: Nondistended, nontender, pos BS Central nervous system: No seizures, no tremors Extremities: No cyanosis, no joint deformities Skin: No rashes, no pallor Psychiatry: Affect normal // no auditory hallucinations   Data Reviewed: I have personally reviewed following labs and imaging studies  CBC: Recent Labs  Lab 06/04/19 0549 06/09/19 0602  WBC 9.3 10.3  NEUTROABS 6.1  --   HGB 10.5* 10.8*  HCT 36.6* 36.6*  MCV 73.2* 73.2*  PLT 235 657   Basic Metabolic Panel: Recent Labs  Lab 06/04/19 0549  06/06/19 0539 06/07/19 0659 06/08/19 0615 06/09/19 0602 06/10/19 0610  NA 143   < > 141 139 142 144 142  K 3.1*   < > 4.0 3.6 3.7 3.3* 4.0  CL 94*   < > 96* 93* 96* 102 106  CO2 37*   < > 31 34* 39* 36* 28  GLUCOSE 94   < > 179* 175* 161* 78 104*  BUN 18   < > 21 35* 34* 27* 21  CREATININE 1.44*   < > 1.49* 1.78* 1.50* 1.37* 1.32*  CALCIUM 9.4   < > 9.7 9.6 9.6 9.5 8.9  MG 1.7  --   --   --   --  1.9  --   PHOS 2.3*  --   --   --   --   --   --    < > = values in this interval not displayed.   GFR: Estimated Creatinine Clearance: 49.4 mL/min (A) (by C-G formula based on SCr of 1.32 mg/dL (H)). Liver Function Tests: Recent Labs  Lab 06/04/19 0549 06/06/19 0539 06/08/19 0615 06/09/19 0602  AST  --  27 24 29   ALT  --  15 17 21   ALKPHOS  --  63 57 56  BILITOT  --  0.6 0.3 0.5  PROT  --  7.2 6.1* 6.1*  ALBUMIN 3.5 3.2* 3.0* 3.0*   No results for input(s): LIPASE, AMYLASE in the last 168 hours. No results for input(s): AMMONIA in the last 168 hours. Coagulation Profile: No results for input(s): INR, PROTIME in the last 168 hours. Cardiac Enzymes: No results for input(s): CKTOTAL, CKMB, CKMBINDEX, TROPONINI in the last 168 hours. BNP (last 3 results) No results for input(s): PROBNP in the last 8760 hours. HbA1C: Recent Labs    06/08/19 0615  HGBA1C 8.2*    CBG: Recent Labs  Lab 06/09/19 2039 06/10/19 0056 06/10/19 0515 06/10/19 0738 06/10/19 1201  GLUCAP 92 80 101* 109* 132*   Lipid Profile: No results for input(s): CHOL, HDL, LDLCALC, TRIG, CHOLHDL, LDLDIRECT in the last 72 hours. Thyroid Function Tests: No results for input(s): TSH, T4TOTAL, FREET4, T3FREE, THYROIDAB in the last 72 hours. Anemia Panel: No results for input(s): VITAMINB12, FOLATE, FERRITIN, TIBC, IRON, RETICCTPCT in the last 72 hours. Sepsis Labs: No results for input(s): PROCALCITON, LATICACIDVEN in the last 168 hours.  Recent Results (from the past 240 hour(s))  SARS Coronavirus 2 (CEPHEID - Performed in [place] hospital lab), Hosp Order     Status: None   Collection Time: 06/02/19  8:17 PM   Specimen: Nasopharyngeal Swab  Result Value Ref Range Status   SARS Coronavirus 2 NEGATIVE NEGATIVE Final    Comment: (NOTE) If result is NEGATIVE SARS-CoV-2 target nucleic acids are NOT DETECTED. The SARS-CoV-2 RNA is generally detectable in upper and lower  respiratory specimens during the acute phase of infection. The lowest  concentration of SARS-CoV-2 viral copies this assay can detect is 250  copies / mL. A negative result does not preclude SARS-CoV-2 infection  and should not be used as the sole basis for treatment or other  patient management decisions.  A negative result may occur with  improper specimen collection / handling, submission of specimen other  than nasopharyngeal swab, presence of viral mutation(s) within the  areas targeted by this assay, and inadequate number of viral copies  (<250 copies / mL). A negative result must be combined with clinical  observations, patient history, and epidemiological information. If result is POSITIVE SARS-CoV-2 target nucleic acids are DETECTED. The SARS-CoV-2 RNA is generally detectable in upper and lower  respiratory specimens dur ing the acute phase of infection.  Positive  results are indicative of  active infection with SARS-CoV-2.  Clinical  correlation with patient history and other diagnostic information is  necessary to determine patient infection status.  Positive results do  not rule out bacterial infection or co-infection with other viruses. If result is PRESUMPTIVE POSTIVE SARS-CoV-2 nucleic acids MAY BE PRESENT.   A presumptive positive result was obtained on the submitted specimen  and confirmed on repeat testing.  While 2019 novel coronavirus  (SARS-CoV-2) nucleic acids may be present in the submitted sample  additional confirmatory testing may be necessary for epidemiological  and / or clinical management purposes  to differentiate between  SARS-CoV-2 and other Sarbecovirus currently known to infect humans.  If clinically indicated additional testing with an alternate test  methodology [phone]) is advised. The SARS-CoV-2 RNA is generally  detectable in upper and lower respiratory sp ecimens during the acute  phase of infection. The expected result is Negative. Fact Sheet for Patients:  StrictlyIdeas.no Fact Sheet for Healthcare Providers: BankingDealers.co.za This test is not yet approved or cleared by the [place] FDA and has been authorized for detection and/or diagnosis of SARS-CoV-2 by FDA under an Emergency Use Authorization (EUA).  This EUA will remain in effect (meaning this test can be used) for the duration of the COVID-19 declaration under Section 564(b)(1) of the Act, 21 U.S.C. section 360bbb-3(b)(1), unless the authorization is terminated or revoked sooner. Performed at [facility], [place] [address], [place], [place] [address]   MRSA PCR Screening     Status: Abnormal   Collection Time: 06/03/19  6:16 PM   Specimen: Nasopharyngeal  Result Value Ref Range Status   MRSA by PCR POSITIVE (A) NEGATIVE  Final    Comment:        The GeneXpert MRSA Assay (FDA approved for NASAL  specimens only), is one component of a comprehensive MRSA colonization surveillance program. It is not intended to diagnose MRSA infection nor to guide or monitor treatment for MRSA infections. RESULT CALLED TO, READ BACK BY AND VERIFIED WITHMargarita Grizzle RN [id number] 06/04/19 A NAVARRO Performed at [facility], [place] [address] [place], [place] [address]   Novel Coronavirus, NAA (hospital order; send-out to ref lab)     Status: None   Collection Time: 06/07/19 10:50 AM   Specimen: Nasopharyngeal Swab; Respiratory  Result Value Ref Range Status   SARS-CoV-2, NAA NOT DETECTED NOT DETECTED Final    Comment: (NOTE) This test was developed and its performance characteristics determined by [employer]. This test has not been FDA cleared or approved. This test has been authorized by FDA under an Emergency Use Authorization (EUA). This test is only authorized for the duration of time the declaration that circumstances exist justifying the authorization of the emergency use of in vitro diagnostic tests for detection of SARS-CoV-2 virus and/or diagnosis of COVID-19 infection under section 564(b)(1) of the Act, 21 U.S.C. 536RWE-3(X)(5), unless the authorization is terminated or revoked sooner. When diagnostic testing is negative, the possibility of a false negative result should be considered in the context of a patient's recent exposures and the presence of clinical signs and symptoms consistent with COVID-19. An individual without symptoms of COVID-19 and who is not shedding SARS-CoV-2 virus would expect to have a negative (not detected) result in this assay. Performed  At: [facility] [place], [place] [id number] Rush Farmer MD JK:[id number]    [place]  Final    Comment: Performed at [facility] [address], [place], [place] [address]     Radiology Studies: Ct Abdomen Pelvis Wo  Contrast  Result Date: 06/08/2019 CLINICAL DATA:  Abdominal distension. Gastric outlet obstruction, new since 06/02/2019. Nausea and abdominal cramping pain. History of prostate cancer treated with radiation therapy. EXAM: CT ABDOMEN AND PELVIS WITHOUT CONTRAST TECHNIQUE: Multidetector CT imaging of the abdomen and pelvis was performed following the standard protocol without IV contrast. COMPARISON:  06/02/2019 and 04/04/2013. FINDINGS: Lower chest: Mitral valve annular calcifications and minimal coronary artery calcification. Minimal bibasilar atelectasis with improvement. Hepatobiliary: No focal liver abnormality is seen. No gallstones, gallbladder wall thickening, or biliary dilatation. Pancreas: Unremarkable. No pancreatic ductal dilatation or surrounding inflammatory changes. The small cystic area seen in the pancreatic body on 04/04/2013 is not well visualized without intravenous contrast today, grossly unchanged. Spleen: Normal in size without focal abnormality. Adrenals/Urinary Tract: Normal appearing adrenal glands. Stable right renal cysts with a tiny calcification in an upper pole cyst. Unremarkable left kidney, ureters and urinary bladder. Stomach/Bowel: Interval nasogastric tube with its tip in the mid to distal stomach. Normal appearing pylorus. No obstructing mass seen. Redundant sigmoid colon with minimal proximal diverticulosis. No evidence of diverticulitis. Unremarkable small bowel and appendix. Vascular/Lymphatic: Atheromatous arterial calcifications without aneurysm. Single enlarged right para-aortic lymph node inferior to the level of the renal arteries with a short axis diameter of 14 mm on image number 41 series 2 this has not changed significantly since 04/04/2013, compatible with a benign process, most likely chronic reactive. No other enlarged lymph nodes. Reproductive: Normal sized prostate gland with adjacent surgical clips or radiation seeds. Other: Small to moderate-sized right  inguinal hernia containing fat and small left inguinal  hernia containing fat without significant change since 04/04/2013. Musculoskeletal: Lumbar and lower thoracic spine degenerative changes and mild scoliosis. Straightening of the normal lumbar lordosis. No evidence of bony metastatic disease. IMPRESSION: No acute abnormality. Electronically Signed   By: Claudie Revering M.D.   On: 06/08/2019 17:14   Dg Abd Portable 1v  Result Date: 06/10/2019 CLINICAL DATA:  85 year old male status post NG tube placement. EXAM: PORTABLE ABDOMEN - 1 VIEW COMPARISON:  CT of the abdomen pelvis dated 06/08/2019 FINDINGS: Partially visualized enteric tube with side-port in the body of the stomach and tip in the distal body. Moderate stool noted in the visualized colon. Density at the left lung base may represent atelectasis. Clinical correlation is recommended. IMPRESSION: Partially visualized enteric tube with tip in the distal body of the stomach. Electronically Signed   By: Anner Crete M.D.   On: 06/10/2019 01:16    Scheduled Meds:  [MAR Hold] aspirin  300 mg Rectal Daily   [MAR Hold] Chlorhexidine Gluconate Cloth  6 each Topical Daily   [MAR Hold] enoxaparin (LOVENOX) injection  0.5 mg/kg Subcutaneous QHS   [MAR Hold] insulin aspart  0-15 Units Subcutaneous Q4H   [MAR Hold] metoprolol tartrate  5 mg Intravenous Q6H   [MAR Hold] mupirocin ointment   Nasal BID   [MAR Hold] pantoprazole (PROTONIX) IV  40 mg Intravenous Q24H   [MAR Hold] sodium chloride flush  3 mL Intravenous Q12H   Continuous Infusions:  [MAR Hold] sodium chloride     sodium chloride     dextrose 5 % and 0.9% NaCl 100 mL/hr at 06/09/19 1422   lactated ringers 1,000 mL (06/10/19 1307)     LOS: 7 days   Marylu Lund, MD [facility] Pager On Amion  If 7PM-7AM, please contact night-coverage 06/10/2019, 2:47 PM

## 2019-06-10 NOTE — Transfer of Care (Signed)
Immediate Anesthesia Transfer of Care Note  Patient: Jared Hall  Procedure(s) Performed: ESOPHAGOGASTRODUODENOSCOPY (EGD) WITH PROPOFOL (N/A )  Patient Location: PACU  Anesthesia Type:MAC  Level of Consciousness: sedated and confused  Airway & Oxygen Therapy: Patient Spontanous Breathing and Patient connected to face mask oxygen  Post-op Assessment: Report given to RN and Post -op Vital signs reviewed and stable  Post vital signs: Reviewed and stable  Last Vitals:  Vitals Value Taken Time  BP    Temp    Pulse    Resp    SpO2      Last Pain:  Vitals:   06/10/19 1256  TempSrc: Temporal  PainSc: 0-No pain         Complications: No apparent anesthesia complications

## 2019-06-10 NOTE — Anesthesia Preprocedure Evaluation (Signed)
Anesthesia Evaluation  Patient identified by MRN, date of birth, ID band Patient awake    Reviewed: Allergy & Precautions, H&P , NPO status , Patient's Chart, lab work & pertinent test results  Airway Mallampati: II  TM Distance: >3 FB Neck ROM: full    Dental no notable dental hx.    Pulmonary sleep apnea ,    Pulmonary exam normal breath sounds clear to auscultation       Cardiovascular hypertension, Pt. on medications + Peripheral Vascular Disease and +CHF  Normal cardiovascular exam Rhythm:Regular Rate:Normal     Neuro/Psych Depression Right side leg weakness residual form cva  On blood thinners TIACVA, Residual Symptoms    GI/Hepatic GERD  Medicated,  Endo/Other  diabetes, Type 2Morbid obesityAM blood sugar in holding room - 72  Renal/GU      Musculoskeletal   Abdominal (+) + obese,   Peds  Hematology  (+) anemia ,   Anesthesia Other Findings   Reproductive/Obstetrics                             Anesthesia Physical  Anesthesia Plan  ASA: III  Anesthesia Plan: MAC   Post-op Pain Management:    Induction: Intravenous  PONV Risk Score and Plan: 1 and Ondansetron and Treatment may vary due to age or medical condition  Airway Management Planned: Nasal Cannula  Additional Equipment:   Intra-op Plan:   Post-operative Plan:   Informed Consent: I have reviewed the patients History and Physical, chart, labs and discussed the procedure including the risks, benefits and alternatives for the proposed anesthesia with the patient or authorized representative who has indicated his/her understanding and acceptance.     Dental advisory given  Plan Discussed with: CRNA, Surgeon and Anesthesiologist  Anesthesia Plan Comments:         Anesthesia Quick Evaluation

## 2019-06-10 NOTE — Progress Notes (Signed)
Spoke yesterday afternoon with patient's son, [name], who is agreeable to endoscopic evaluation, having discussed risks and purpose of procedure with him.  We will proceed, as planned, with endoscopy around 130 today.  Potassium is improved at 4.0.  This morning's KUB shows complete gastric decompression, markedly improved from 2 days ago.  Patient seems somewhat more alert this morning, and I can understand much of his conversation, which includes the fact that he wants to go back to his extended care facility, [facility], this afternoon.  His abdomen is quite adipose, but soft and without any reproducible tenderness although the patient is somewhat "touchy" with some intermittent, nonreproducible voluntary guarding.  Cleotis Nipper, M.D. Pager [phone] If no answer or after 5 PM call [phone]  .

## 2019-06-10 NOTE — Anesthesia Postprocedure Evaluation (Signed)
**Note Jared-Identified via Obfuscation** Anesthesia Post Note  Patient: DEMONE Hall  Procedure(s) Performed: ESOPHAGOGASTRODUODENOSCOPY (EGD) WITH PROPOFOL (N/A )     Patient location during evaluation: Endoscopy Anesthesia Type: MAC Level of consciousness: awake and alert Pain management: pain level controlled Vital Signs Assessment: post-procedure vital signs reviewed and stable Respiratory status: spontaneous breathing, nonlabored ventilation, respiratory function stable and patient connected to nasal cannula oxygen Cardiovascular status: stable and blood pressure returned to baseline Postop Assessment: no apparent nausea or vomiting Anesthetic complications: no    Last Vitals:  Vitals:   06/10/19 0519 06/10/19 1256  BP: (!) 155/82 (!) 186/60  Pulse: 66 (!) 58  Resp: 18 18  Temp: 37 C 36.5 C  SpO2: 100% 100%    Last Pain:  Vitals:   06/10/19 1256  TempSrc: Temporal  PainSc: 0-No pain                 Jared Hall

## 2019-06-10 NOTE — Interval H&P Note (Signed)
History and Physical Interval Note:  06/10/2019 1:31 PM  Jared Hall  has presented today for surgery, with the diagnosis of gastric outlet obstruction.  The various methods of treatment have been discussed with the patient and family. After consideration of risks, benefits and other options for treatment, the patient has consented to  Procedure(s): ESOPHAGOGASTRODUODENOSCOPY (EGD) WITH PROPOFOL (N/A) as a surgical intervention.  The patient's history has been reviewed, patient examined, no change in status, stable for surgery.  I have reviewed the patient's chart and labs.  Questions were answered to the patient's satisfaction.     Jared Hall

## 2019-06-11 ENCOUNTER — Encounter (HOSPITAL_COMMUNITY): Payer: Self-pay | Admitting: Gastroenterology

## 2019-06-11 ENCOUNTER — Inpatient Hospital Stay (HOSPITAL_COMMUNITY): Payer: Medicare Other

## 2019-06-11 LAB — GLUCOSE, CAPILLARY
Glucose-Capillary: 108 mg/dL — ABNORMAL HIGH (ref 70–99)
Glucose-Capillary: 133 mg/dL — ABNORMAL HIGH (ref 70–99)
Glucose-Capillary: 53 mg/dL — ABNORMAL LOW (ref 70–99)
Glucose-Capillary: 77 mg/dL (ref 70–99)
Glucose-Capillary: 77 mg/dL (ref 70–99)
Glucose-Capillary: 82 mg/dL (ref 70–99)
Glucose-Capillary: 83 mg/dL (ref 70–99)

## 2019-06-11 LAB — COMPREHENSIVE METABOLIC PANEL
ALT: 20 U/L (ref 0–44)
AST: 23 U/L (ref 15–41)
Albumin: 3 g/dL — ABNORMAL LOW (ref 3.5–5.0)
Alkaline Phosphatase: 65 U/L (ref 38–126)
Anion gap: 8 (ref 5–15)
BUN: 20 mg/dL (ref 8–23)
CO2: 30 mmol/L (ref 22–32)
Calcium: 9 mg/dL (ref 8.9–10.3)
Chloride: 105 mmol/L (ref 98–111)
Creatinine, Ser: 1.35 mg/dL — ABNORMAL HIGH (ref 0.61–1.24)
GFR calc Af Amer: 55 mL/min — ABNORMAL LOW (ref >60)
GFR calc non Af Amer: 47 mL/min — ABNORMAL LOW (ref >60)
Glucose, Bld: 70 mg/dL (ref 70–99)
Potassium: 3.6 mmol/L (ref 3.5–5.1)
Sodium: 143 mmol/L (ref 135–145)
Total Bilirubin: 0.9 mg/dL (ref 0.3–1.2)
Total Protein: 6.2 g/dL — ABNORMAL LOW (ref 6.5–8.1)

## 2019-06-11 NOTE — Progress Notes (Signed)
Inpatient Diabetes Program Recommendations  AACE/ADA: New Consensus Statement on Inpatient Glycemic Control (2015)  Target Ranges:  Prepandial:   less than 140 mg/dL      Peak postprandial:   less than 180 mg/dL (1-2 hours)      Critically ill patients:  140 - 180 mg/dL   Results for CULLEN, LAHAIE (MRN [id number]) as of 06/11/2019 09:39  Ref. Range 06/10/2019 00:56 06/10/2019 05:15 06/10/2019 07:38 06/10/2019 12:01 06/10/2019 15:47 06/10/2019 20:25  Glucose-Capillary Latest Ref Range: 70 - 99 mg/dL 80 101 (H) 109 (H) 132 (H)  2 units NOVOLOG  91 173 (H)  3 units NOVOLOG +  20 units LANTUS   Results for TOMI, PADDOCK (MRN [id number]) as of 06/11/2019 09:39  Ref. Range 06/11/2019 00:12 06/11/2019 04:28 06/11/2019 07:06 06/11/2019 08:02  Glucose-Capillary Latest Ref Range: 70 - 99 mg/dL 77 53 (L) 83 82     Home DM Meds: Lantus 70 units BID       Novolog 5 units TID with meals       Novolog 7-18 units BID per SSI  Current Orders: Lantus 20 units Daily      Novolog Moderate Correction Scale/ SSI (0-15 units) TID Q4 hours       MD- Note patient with Hypoglycemia this AM.  Please consider reducing Lantus to 10 units Daily     --Will follow patient during hospitalization--  Wyn Quaker RN, MSN, CDE Diabetes Coordinator Inpatient Glycemic Control Team Team Pager: [phone] (8a-5p)

## 2019-06-11 NOTE — Progress Notes (Signed)
Occupational Therapy Treatment Patient Details Name: Jared Hall MRN: [id number] DOB: 03-08-1931 Today's Date: 06/11/2019    History of present illness 85 y.o. male, w Anemia, Thallassemia, hypertension, hyperlipidemia, Dm2, h/o Stroke, Pafib, CHF (EF 55%),  OSA, Bph/ prostate cancer s/p XRT apparently present with c/o nausea as well as abdominal discomfort "cramping".   OT comments  Much improved transfer.  Pt had BM and continued to have more during cleaning up. Not aware of this, stating he was constipated  Follow Up Recommendations  SNF(ALF if they can provide needed A) HHOT if back to [place] Recommendations  (? drop arm commode)    Recommendations for Other Services      Precautions / Restrictions Precautions Precautions: Fall Restrictions Weight Bearing Restrictions: No       Mobility Bed Mobility         Supine to sit: Min assist;HOB elevated        Transfers     Transfers: Lateral/Scoot Transfers          Lateral/Scoot Transfers: Min assist;+2 safety/equipment General transfer comment: going slightly downhill. Pt also scooted towards HOB prior to transfer    Balance                                           ADL either performed or assessed with clinical judgement   ADL                           Toilet Transfer: Minimal assistance;+2 for safety/equipment(lateral to drop arm chair)   Toileting- Clothing Manipulation and Hygiene: Total assistance;+2 for safety/equipment(rolling in recliner)         General ADL Comments: pt noted to have had BM when transferred to chair. Rolled on side, bil from recliner with head down.  Pt continued to have BM without awareness. Min A to Arts development officer      Cognition Arousal/Alertness: Awake/alert Behavior During Therapy: WFL for tasks assessed/performed Overall Cognitive Status: No family/caregiver present to  determine baseline cognitive functioning                                 General Comments: perseverating on going back to [employer]        Exercises     Shoulder Instructions       General Comments      Pertinent Vitals/ Pain       Pain Assessment: No/denies pain  Home Living                                          Prior Functioning/Environment              Frequency  Min 2X/week        Progress Toward Goals  OT Goals(current goals can now be found in the care plan section)  Progress towards OT goals: Progressing toward goals     Plan      Co-evaluation                 AM-PAC OT "6  Clicks" Daily Activity     Outcome Measure   Help from another person eating meals?: None Help from another person taking care of personal grooming?: A Little Help from another person toileting, which includes using toliet, bedpan, or urinal?: Total Help from another person bathing (including washing, rinsing, drying)?: A Lot Help from another person to put on and taking off regular upper body clothing?: A Little Help from another person to put on and taking off regular lower body clothing?: Total 6 Click Score: 14    End of Session    OT Visit Diagnosis: Muscle weakness (generalized) (M62.81)   Activity Tolerance Patient tolerated treatment well   Patient Left in chair;with call bell/phone within reach;with chair alarm set   Nurse Communication          Time: [phone] OT Time Calculation (min): 34 min  Charges: OT General Charges $OT Visit: 1 Visit OT Treatments $Self Care/Home Management : 8-22 mins $Therapeutic Activity: 8-22 mins  Lesle Chris, OTR/L Acute Rehabilitation Services [phone] [facility] pager [phone] office 06/11/2019   [place] 06/11/2019, 4:11 PM

## 2019-06-11 NOTE — Progress Notes (Signed)
[name]  PROGRESS NOTE    Jared Hall  TKP:[id number] DOB: 1931/02/18 DOA: 06/02/2019 PCP: Josetta Huddle, MD   Brief Narrative:   85 y.o.male,w Anemia, Thallassemia, hypertension, hyperlipidemia, Dm2, h/o Stroke, Pafib, CHF (EF 55%), OSA, Bph/ prostate cancer s/p XRT apparently present with c/o nausea as well as abdominal discomfort "cramping". Pt denies fever, chills, cough, cp, palp, constipation, diarrhea, brbpr, black stool.   Assessment & Plan:   Principal Problem:   Hypokalemia Active Problems:   DM (diabetes mellitus) ([place])   History of CVA (cerebrovascular accident)   Essential hypertension, benign   GERD (gastroesophageal reflux disease)   DM type 2 with diabetic peripheral neuropathy (HCC)   Paroxysmal atrial fibrillation (HCC)   Chronic kidney disease, stage III (moderate) (HCC)   Anemia   Hypokalemia likely secondary to diuretics (lasix, metolazone)     - resolved; continue aggressive K+ repletion per GI  Pafib , chronic diastolic CHF (EF 51%)     - Presently stable at this AM     - clinically dehydrated, thus holding further diuretic     - Continued on gentle IVF hydration     - Pt is continued on IV beta blocker and held plavix. Will continue PR aspirin until pt is able to reliably tolerate PO     - let's see if we can transition to PO in the AM   DM2 w CKD stage3 - Pt had been NPO per below, now on clears post-procedure per GI     - Will continue lantus at 20 units daily , home dose is 50 units BID     - Continue on Q4h SSI coverage     - continued on gentle IVF hydration  Diabetic neuropathy, Chronic pain - Seems to be stable currently  Hyperlipidemia, h/o CVA - Simvastatin 20mg  po qhs on hold as pt was NPO per below - Cont on aspirin, plavix was on hold as pt is NPO. Will resume if pt tolerates PO reliably     -  Currently stable  Gerd - Continue on IV PPI  BPH - seems to be stable  Constipation - Recently with  good results from soap suds enema     - Recent CT abd/pelvis from 7/19 personally reviewed. Stool noted predominately in ascending and transverse colon  Anemia, Thallasemia - Remains hemodynamically stable  ARF     - SCr has improved with IVF hydration     - Dry membranes on exam     - Cont to hold diuretic per above and will continue gentle IVF hydration     - believe he is at his baseline; monitor  Gastric outlet obstruction     - Follow up abd xray on 7/19 with findings worrisome for newly distended stomach concerning for gastric outlet obstruction     - NG was placed with nearly 1.5L output overnight     - Repeat CT abd/pelvis personally reviewed with no acute findings noted. Stool in ascending and transverse colons per my own read     - Appreciate input by GI. Pt has since undergone EGD on 7/21 without findings of mass or lesion to explain obstruction. Started trial of clears with NG since removed  Let's start the transition back to total PO in the AM. If he tolerates, look for d/c in the PM tomorrow.   DVT prophylaxis: lovenox Code Status: DNR   Disposition Plan: Return to SNF when medically stable   Consultants:   GI  Subjective: "[facility] room at [facility]!"  Objective: Vitals:   06/10/19 1500 06/10/19 2032 06/11/19 0500 06/11/19 1356  BP: (!) 173/95 134/60 140/62 (!) 124/98  Pulse: 72 63 70 76  Resp: 19 17 17 19   Temp:  98.5 F (36.9 C) 97.7 F (36.5 C) 98.4 F (36.9 C)  TempSrc:  Oral Oral Oral  SpO2: 94% 100% 100% 95%  Weight:      Height:        Intake/Output Summary (Last 24 hours) at 06/11/2019 1621 Last data filed at 06/11/2019 1400 Gross per 24 hour  Intake 1234.42 ml  Output -  Net 1234.42 ml   Filed Weights   06/09/19 0442 06/10/19 0519 06/10/19 1256  Weight: 109.5 kg 108.1 kg 108.1 kg    Examination:  General: 85 y.o. male resting in bed in NAD Cardiovascular: RRR, +S1, S2, no m/g/r, equal pulses throughout Respiratory:  CTABL, no w/r/r, normal WOB GI: BS+, NDNT, no masses noted, no organomegaly noted MSK: No e/c/c Neuro: alert to name, follows commands     Data Reviewed: I have personally reviewed following labs and imaging studies.  CBC: Recent Labs  Lab 06/09/19 0602  WBC 10.3  HGB 10.8*  HCT 36.6*  MCV 73.2*  PLT 081   Basic Metabolic Panel: Recent Labs  Lab 06/07/19 0659 06/08/19 0615 06/09/19 0602 06/10/19 0610 06/11/19 0554  NA 139 142 144 142 143  K 3.6 3.7 3.3* 4.0 3.6  CL 93* 96* 102 106 105  CO2 34* 39* 36* 28 30  GLUCOSE 175* 161* 78 104* 70  BUN 35* 34* 27* 21 20  CREATININE 1.78* 1.50* 1.37* 1.32* 1.35*  CALCIUM 9.6 9.6 9.5 8.9 9.0  MG  --   --  1.9  --   --    GFR: Estimated Creatinine Clearance: 48.3 mL/min (A) (by C-G formula based on SCr of 1.35 mg/dL (H)). Liver Function Tests: Recent Labs  Lab 06/06/19 0539 06/08/19 0615 06/09/19 0602 06/11/19 0554  AST 27 24 29 23   ALT 15 17 21 20   ALKPHOS 63 57 56 65  BILITOT 0.6 0.3 0.5 0.9  PROT 7.2 6.1* 6.1* 6.2*  ALBUMIN 3.2* 3.0* 3.0* 3.0*   No results for input(s): LIPASE, AMYLASE in the last 168 hours. No results for input(s): AMMONIA in the last 168 hours. Coagulation Profile: No results for input(s): INR, PROTIME in the last 168 hours. Cardiac Enzymes: No results for input(s): CKTOTAL, CKMB, CKMBINDEX, TROPONINI in the last 168 hours. BNP (last 3 results) No results for input(s): PROBNP in the last 8760 hours. HbA1C: No results for input(s): HGBA1C in the last 72 hours. CBG: Recent Labs  Lab 06/11/19 0012 06/11/19 0428 06/11/19 0706 06/11/19 0802 06/11/19 1202  GLUCAP 77 53* 83 82 133*   Lipid Profile: No results for input(s): CHOL, HDL, LDLCALC, TRIG, CHOLHDL, LDLDIRECT in the last 72 hours. Thyroid Function Tests: No results for input(s): TSH, T4TOTAL, FREET4, T3FREE, THYROIDAB in the last 72 hours. Anemia Panel: No results for input(s): VITAMINB12, FOLATE, FERRITIN, TIBC, IRON, RETICCTPCT  in the last 72 hours. Sepsis Labs: No results for input(s): PROCALCITON, LATICACIDVEN in the last 168 hours.  Recent Results (from the past 240 hour(s))  SARS Coronavirus 2 (CEPHEID - Performed in [place] hospital lab), Hosp Order     Status: None   Collection Time: 06/02/19  8:17 PM   Specimen: Nasopharyngeal Swab  Result Value Ref Range Status   SARS Coronavirus 2 NEGATIVE NEGATIVE Final  Comment: (NOTE) If result is NEGATIVE SARS-CoV-2 target nucleic acids are NOT DETECTED. The SARS-CoV-2 RNA is generally detectable in upper and lower  respiratory specimens during the acute phase of infection. The lowest  concentration of SARS-CoV-2 viral copies this assay can detect is 250  copies / mL. A negative result does not preclude SARS-CoV-2 infection  and should not be used as the sole basis for treatment or other  patient management decisions.  A negative result may occur with  improper specimen collection / handling, submission of specimen other  than nasopharyngeal swab, presence of viral mutation(s) within the  areas targeted by this assay, and inadequate number of viral copies  (<250 copies / mL). A negative result must be combined with clinical  observations, patient history, and epidemiological information. If result is POSITIVE SARS-CoV-2 target nucleic acids are DETECTED. The SARS-CoV-2 RNA is generally detectable in upper and lower  respiratory specimens dur ing the acute phase of infection.  Positive  results are indicative of active infection with SARS-CoV-2.  Clinical  correlation with patient history and other diagnostic information is  necessary to determine patient infection status.  Positive results do  not rule out bacterial infection or co-infection with other viruses. If result is PRESUMPTIVE POSTIVE SARS-CoV-2 nucleic acids MAY BE PRESENT.   A presumptive positive result was obtained on the submitted specimen  and confirmed on repeat testing.  While 2019  novel coronavirus  (SARS-CoV-2) nucleic acids may be present in the submitted sample  additional confirmatory testing may be necessary for epidemiological  and / or clinical management purposes  to differentiate between  SARS-CoV-2 and other Sarbecovirus currently known to infect humans.  If clinically indicated additional testing with an alternate test  methodology [phone]) is advised. The SARS-CoV-2 RNA is generally  detectable in upper and lower respiratory sp ecimens during the acute  phase of infection. The expected result is Negative. Fact Sheet for Patients:  StrictlyIdeas.no Fact Sheet for Healthcare Providers: BankingDealers.co.za This test is not yet approved or cleared by the [place] FDA and has been authorized for detection and/or diagnosis of SARS-CoV-2 by FDA under an Emergency Use Authorization (EUA).  This EUA will remain in effect (meaning this test can be used) for the duration of the COVID-19 declaration under Section 564(b)(1) of the Act, 21 U.S.C. section 360bbb-3(b)(1), unless the authorization is terminated or revoked sooner. Performed at [facility], [place] [address], [place], [place] [address]   MRSA PCR Screening     Status: Abnormal   Collection Time: 06/03/19  6:16 PM   Specimen: Nasopharyngeal  Result Value Ref Range Status   MRSA by PCR POSITIVE (A) NEGATIVE Final    Comment:        The GeneXpert MRSA Assay (FDA approved for NASAL specimens only), is one component of a comprehensive MRSA colonization surveillance program. It is not intended to diagnose MRSA infection nor to guide or monitor treatment for MRSA infections. RESULT CALLED TO, READ BACK BY AND VERIFIED WITHMargarita Grizzle RN [id number] 06/04/19 A NAVARRO Performed at [facility], [place] [address] [place], [place] [address]   Novel Coronavirus, NAA (hospital order; send-out to ref lab)     Status: None    Collection Time: 06/07/19 10:50 AM   Specimen: Nasopharyngeal Swab; Respiratory  Result Value Ref Range Status   SARS-CoV-2, NAA NOT DETECTED NOT DETECTED Final    Comment: (NOTE) This test was developed and its performance characteristics determined by [employer]. This  test has not been FDA cleared or approved. This test has been authorized by FDA under an Emergency Use Authorization (EUA). This test is only authorized for the duration of time the declaration that circumstances exist justifying the authorization of the emergency use of in vitro diagnostic tests for detection of SARS-CoV-2 virus and/or diagnosis of COVID-19 infection under section 564(b)(1) of the Act, 21 U.S.C. 790WIO-9(B)(3), unless the authorization is terminated or revoked sooner. When diagnostic testing is negative, the possibility of a false negative result should be considered in the context of a patient's recent exposures and the presence of clinical signs and symptoms consistent with COVID-19. An individual without symptoms of COVID-19 and who is not shedding SARS-CoV-2 virus would expect to have a negative (not detected) result in this assay. Performed  At: [facility] [place], [place] [id number] Rush Farmer MD ST:[id number]    [place]  Final    Comment: Performed at [place] [address], [place], [place] [address]         Radiology Studies: Dg Abd 1 View  Result Date: 06/11/2019 CLINICAL DATA:  Abdominal distension. EXAM: ABDOMEN - 1 VIEW COMPARISON:  Radiograph June 10, 2019. FINDINGS: The bowel gas pattern is normal. No radio-opaque calculi or other significant radiographic abnormality are seen. IMPRESSION: Negative. Electronically Signed   By: Marijo Conception M.D.   On: 06/11/2019 13:32   Dg Abd Portable 1v  Result Date: 06/10/2019 CLINICAL DATA:  85 year old male status post NG tube placement. EXAM:  PORTABLE ABDOMEN - 1 VIEW COMPARISON:  CT of the abdomen pelvis dated 06/08/2019 FINDINGS: Partially visualized enteric tube with side-port in the body of the stomach and tip in the distal body. Moderate stool noted in the visualized colon. Density at the left lung base may represent atelectasis. Clinical correlation is recommended. IMPRESSION: Partially visualized enteric tube with tip in the distal body of the stomach. Electronically Signed   By: Anner Crete M.D.   On: 06/10/2019 01:16        Scheduled Meds: . aspirin  300 mg Rectal Daily  . Chlorhexidine Gluconate Cloth  6 each Topical Daily  . enoxaparin (LOVENOX) injection  0.5 mg/kg Subcutaneous QHS  . insulin aspart  0-15 Units Subcutaneous Q4H  . insulin glargine  20 Units Subcutaneous Daily  . metoprolol tartrate  5 mg Intravenous Q6H  . mupirocin ointment   Nasal BID  . pantoprazole (PROTONIX) IV  40 mg Intravenous Q24H  . sodium chloride flush  3 mL Intravenous Q12H   Continuous Infusions: . sodium chloride    . lactated ringers 75 mL/hr at 06/11/19 1400     LOS: 8 days    Time spent: 25 minutes spent in the coordination of care today.    Jonnie Finner, DO [facility] Pager [phone]  If 7PM-7AM, please contact night-coverage [email or web address] Password [facility] 06/11/2019, 4:21 PM

## 2019-06-11 NOTE — Progress Notes (Signed)
The patient is chipper, and alert.  He is again saying how he wants to go back to [employer].  He is asking if I am going to release him today.  He appears to be tolerating his clear liquid diet.  No complaint of abdominal discomfort.  His abdomen is adipose but not overtly distended.  However, there is moderately significant tympany.  No succussion splash is present.  Potassium level is satisfactory at 3.6.  KUB results are pending from this morning.  Impression: Apparent improvement in upper tract symptoms, which I think was the result of hypokalemia induced exacerbation of previously subclinical diabetic gastroparesis.  Recommendation:  1.  Would supplement potassium a little more aggressively, to keep level closer to 4.0 2.  If KUB from today shows no gastric dilatation, would probably advance diet and, if that is tolerated, discharge home in the next day or so would be reasonable.  Cleotis Nipper, M.D. Pager [phone] If no answer or after 5 PM call [phone]

## 2019-06-12 LAB — CBC WITH DIFFERENTIAL/PLATELET
Abs Immature Granulocytes: 0.05 10*3/uL (ref 0.00–0.07)
Basophils Absolute: 0 10*3/uL (ref 0.0–0.1)
Basophils Relative: 0 %
Eosinophils Absolute: 0.3 10*3/uL (ref 0.0–0.5)
Eosinophils Relative: 3 %
HCT: 36.4 % — ABNORMAL LOW (ref 39.0–52.0)
Hemoglobin: 10.7 g/dL — ABNORMAL LOW (ref 13.0–17.0)
Immature Granulocytes: 1 %
Lymphocytes Relative: 13 %
Lymphs Abs: 1.2 10*3/uL (ref 0.7–4.0)
MCH: 21.4 pg — ABNORMAL LOW (ref 26.0–34.0)
MCHC: 29.4 g/dL — ABNORMAL LOW (ref 30.0–36.0)
MCV: 72.9 fL — ABNORMAL LOW (ref 80.0–100.0)
Monocytes Absolute: 0.9 10*3/uL (ref 0.1–1.0)
Monocytes Relative: 10 %
Neutro Abs: 7 10*3/uL (ref 1.7–7.7)
Neutrophils Relative %: 73 %
Platelets: 200 10*3/uL (ref 150–400)
RBC: 4.99 MIL/uL (ref 4.22–5.81)
RDW: 15.7 % — ABNORMAL HIGH (ref 11.5–15.5)
WBC: 9.5 10*3/uL (ref 4.0–10.5)
nRBC: 0 % (ref 0.0–0.2)

## 2019-06-12 LAB — GLUCOSE, CAPILLARY
Glucose-Capillary: 100 mg/dL — ABNORMAL HIGH (ref 70–99)
Glucose-Capillary: 122 mg/dL — ABNORMAL HIGH (ref 70–99)
Glucose-Capillary: 82 mg/dL (ref 70–99)
Glucose-Capillary: 88 mg/dL (ref 70–99)
Glucose-Capillary: 89 mg/dL (ref 70–99)
Glucose-Capillary: 90 mg/dL (ref 70–99)

## 2019-06-12 LAB — RENAL FUNCTION PANEL
Albumin: 2.7 g/dL — ABNORMAL LOW (ref 3.5–5.0)
Anion gap: 7 (ref 5–15)
BUN: 16 mg/dL (ref 8–23)
CO2: 31 mmol/L (ref 22–32)
Calcium: 9 mg/dL (ref 8.9–10.3)
Chloride: 106 mmol/L (ref 98–111)
Creatinine, Ser: 1.21 mg/dL (ref 0.61–1.24)
GFR calc Af Amer: >60 mL/min (ref >60)
GFR calc non Af Amer: 54 mL/min — ABNORMAL LOW (ref >60)
Glucose, Bld: 103 mg/dL — ABNORMAL HIGH (ref 70–99)
Phosphorus: 2.9 mg/dL (ref 2.5–4.6)
Potassium: 3.9 mmol/L (ref 3.5–5.1)
Sodium: 144 mmol/L (ref 135–145)

## 2019-06-12 LAB — MAGNESIUM: Magnesium: 1.8 mg/dL (ref 1.7–2.4)

## 2019-06-12 MED ORDER — SIMVASTATIN 10 MG PO TABS
20.0000 mg | ORAL_TABLET | Freq: Every day | ORAL | Status: DC
  Administered 2019-06-12 – 2019-06-13 (×2): 20 mg via ORAL
  Filled 2019-06-12 (×2): qty 2

## 2019-06-12 MED ORDER — CLOPIDOGREL BISULFATE 75 MG PO TABS
75.0000 mg | ORAL_TABLET | Freq: Every day | ORAL | Status: DC
  Administered 2019-06-13 – 2019-06-14 (×2): 75 mg via ORAL
  Filled 2019-06-12 (×2): qty 1

## 2019-06-12 MED ORDER — PANTOPRAZOLE SODIUM 40 MG PO TBEC
40.0000 mg | DELAYED_RELEASE_TABLET | Freq: Every day | ORAL | Status: DC
  Administered 2019-06-12 – 2019-06-14 (×3): 40 mg via ORAL
  Filled 2019-06-12 (×3): qty 1

## 2019-06-12 MED ORDER — METOPROLOL TARTRATE 25 MG PO TABS
25.0000 mg | ORAL_TABLET | Freq: Two times a day (BID) | ORAL | Status: DC
  Administered 2019-06-12 – 2019-06-14 (×4): 25 mg via ORAL
  Filled 2019-06-12 (×4): qty 1

## 2019-06-12 MED ORDER — INSULIN GLARGINE 100 UNIT/ML ~~LOC~~ SOLN
10.0000 [IU] | Freq: Every day | SUBCUTANEOUS | Status: DC
  Administered 2019-06-13 – 2019-06-14 (×2): 10 [IU] via SUBCUTANEOUS
  Filled 2019-06-12 (×2): qty 0.1

## 2019-06-12 MED ORDER — ASPIRIN EC 81 MG PO TBEC
81.0000 mg | DELAYED_RELEASE_TABLET | Freq: Every day | ORAL | Status: DC
  Administered 2019-06-13 – 2019-06-14 (×2): 81 mg via ORAL
  Filled 2019-06-12 (×2): qty 1

## 2019-06-12 NOTE — Progress Notes (Signed)
Jared Hall refused is supper tray he stated 'I dont want it I just want to lay down ." I offered him liquids and he refused them also.

## 2019-06-12 NOTE — Progress Notes (Addendum)
[name] Kitchen  PROGRESS NOTE    AVION KUTZER  JAS:[id number] DOB: 1931-11-15 DOA: 06/02/2019 PCP: Josetta Huddle, MD   Brief Narrative:   85 y.o.male,w Anemia, Thallassemia, hypertension, hyperlipidemia, Dm2, h/o Stroke, Pafib, CHF (EF 55%), OSA, Bph/ prostate cancer s/p XRT apparently present with c/o nausea as well as abdominal discomfort "cramping". Pt denies fever, chills, cough, cp, palp, constipation, diarrhea, brbpr, black stool.   Assessment & Plan:   Principal Problem:   Hypokalemia Active Problems:   DM (diabetes mellitus) ([place])   History of CVA (cerebrovascular accident)   Essential hypertension, benign   GERD (gastroesophageal reflux disease)   DM type 2 with diabetic peripheral neuropathy (HCC)   Paroxysmal atrial fibrillation (HCC)   Chronic kidney disease, stage III (moderate) (HCC)   Anemia   Hypokalemia likely secondary to diuretics (lasix, metolazone)     - resolved; continue aggressive K+ repletion per GI  Pafib , chronic diastolic CHF (EF 41%)     - Presently stable at this AM     - clinically dehydrated, thus holding further diuretic     - Continued on gentle IVF hydration     - Pt is continued on IV beta blocker and held plavix. Will continue PR aspirin until pt is able to reliably tolerate PO     - resume PO metoprolol, plavix, ASA; d/c IV metoprolol and PR ASA  DM2 w CKD stage3 - Pt had been NPO per below, now on clears post-procedure per GI     - decrease lantus to 10 units daily , home dose is 50 units BID     - Continue on Q4h SSI coverage     - stop fluids  Diabetic neuropathy, Chronic pain - Seems to be stable currently  Hyperlipidemia, h/o CVA - Simvastatin 20mg  po qhs on hold as pt was NPO per below - Cont on aspirin, plavix was on hold as pt is NPO. Will resume if pt tolerates PO reliably     - Currently stable     - resume PO ASA, plavix, zocor   Gerd - Continue on IV PPI  BPH - seems to be stable   Constipation - Recently with good results from soap suds enema     - Recent CT abd/pelvis from 7/19 personally reviewed. Stool noted predominately in ascending and transverse colon  Anemia, Thallasemia - Remains hemodynamically stable  ARF     - SCr has improved with IVF hydration     - Dry membranes on exam     - Cont to hold diuretic per above and will continue gentle IVF hydration     - believe he is at his baseline; monitor     - stop fluids     - at some point we will need to resume his diuretic  Gastric outlet obstruction     - Follow up abd xray on 7/19 with findings worrisome for newly distended stomach concerning for gastric outlet obstruction     - NG was placed with nearly 1.5L output overnight     - Repeat CT abd/pelvis personally reviewed with no acute findings noted. Stool in ascending and transverse colons per my own read     - Appreciate input by GI. Pt has since undergone EGD on 7/21 without findings of mass or lesion to explain obstruction. Started trial of clears with NG since removed     - on soft diet; need to see him eating more/tolerating before we send him back; resume PO  meds  Resume PO metoprolol, ASA, plavix. W   DVT prophylaxis: lovenox Code Status: DNR   Disposition Plan: Return to SNF when medically stable   Consultants:   GI   Subjective: "I have a room. I have a room."  Objective: Vitals:   06/11/19 2128 06/12/19 0031 06/12/19 0529 06/12/19 1340  BP: (!) 159/60 (!) 163/79 (!) 158/69 (!) 150/64  Pulse: (!) 56 73 67 66  Resp: 20  20 18   Temp: (!) 97.4 F (36.3 C)  97.8 F (36.6 C) 97.9 F (36.6 C)  TempSrc: Oral  Oral Oral  SpO2: 98% 100% 100% 100%  Weight:   110.9 kg   Height:        Intake/Output Summary (Last 24 hours) at 06/12/2019 1347 Last data filed at 06/12/2019 0800 Gross per 24 hour  Intake 1107.64 ml  Output -  Net 1107.64 ml   Filed Weights   06/10/19 0519 06/10/19 1256 06/12/19 0529  Weight: 108.1 kg  108.1 kg 110.9 kg    Examination:  General: 85 y.o. male resting in bed in NAD Cardiovascular: RRR, +S1, S2, no m/g/r, equal pulses throughout Respiratory: CTABL, no w/r/r, normal WOB GI: BS+, NDNT, no masses noted, no organomegaly noted MSK: No e/c/c Neuro: alert to name, follows commands    Data Reviewed: I have personally reviewed following labs and imaging studies.  CBC: Recent Labs  Lab 06/09/19 0602 06/12/19 0600  WBC 10.3 9.5  NEUTROABS  --  7.0  HGB 10.8* 10.7*  HCT 36.6* 36.4*  MCV 73.2* 72.9*  PLT 251 161   Basic Metabolic Panel: Recent Labs  Lab 06/08/19 0615 06/09/19 0602 06/10/19 0610 06/11/19 0554 06/12/19 0600  NA 142 144 142 143 144  K 3.7 3.3* 4.0 3.6 3.9  CL 96* 102 106 105 106  CO2 39* 36* 28 30 31   GLUCOSE 161* 78 104* 70 103*  BUN 34* 27* 21 20 16   CREATININE 1.50* 1.37* 1.32* 1.35* 1.21  CALCIUM 9.6 9.5 8.9 9.0 9.0  MG  --  1.9  --   --  1.8  PHOS  --   --   --   --  2.9   GFR: Estimated Creatinine Clearance: 54.7 mL/min (by C-G formula based on SCr of 1.21 mg/dL). Liver Function Tests: Recent Labs  Lab 06/06/19 0539 06/08/19 0615 06/09/19 0602 06/11/19 0554 06/12/19 0600  AST 27 24 29 23   --   ALT 15 17 21 20   --   ALKPHOS 63 57 56 65  --   BILITOT 0.6 0.3 0.5 0.9  --   PROT 7.2 6.1* 6.1* 6.2*  --   ALBUMIN 3.2* 3.0* 3.0* 3.0* 2.7*   No results for input(s): LIPASE, AMYLASE in the last 168 hours. No results for input(s): AMMONIA in the last 168 hours. Coagulation Profile: No results for input(s): INR, PROTIME in the last 168 hours. Cardiac Enzymes: No results for input(s): CKTOTAL, CKMB, CKMBINDEX, TROPONINI in the last 168 hours. BNP (last 3 results) No results for input(s): PROBNP in the last 8760 hours. HbA1C: No results for input(s): HGBA1C in the last 72 hours. CBG: Recent Labs  Lab 06/11/19 2123 06/12/19 0024 06/12/19 0413 06/12/19 0806 06/12/19 1202  GLUCAP 108* 89 100* 82 122*   Lipid Profile: No  results for input(s): CHOL, HDL, LDLCALC, TRIG, CHOLHDL, LDLDIRECT in the last 72 hours. Thyroid Function Tests: No results for input(s): TSH, T4TOTAL, FREET4, T3FREE, THYROIDAB in the last 72 hours. Anemia Panel: No results for input(s):  VITAMINB12, FOLATE, FERRITIN, TIBC, IRON, RETICCTPCT in the last 72 hours. Sepsis Labs: No results for input(s): PROCALCITON, LATICACIDVEN in the last 168 hours.  Recent Results (from the past 240 hour(s))  SARS Coronavirus 2 (CEPHEID - Performed in [facility] hospital lab), Hosp Order     Status: None   Collection Time: 06/02/19  8:17 PM   Specimen: Nasopharyngeal Swab  Result Value Ref Range Status   SARS Coronavirus 2 NEGATIVE NEGATIVE Final    Comment: (NOTE) If result is NEGATIVE SARS-CoV-2 target nucleic acids are NOT DETECTED. The SARS-CoV-2 RNA is generally detectable in upper and lower  respiratory specimens during the acute phase of infection. The lowest  concentration of SARS-CoV-2 viral copies this assay can detect is 250  copies / mL. A negative result does not preclude SARS-CoV-2 infection  and should not be used as the sole basis for treatment or other  patient management decisions.  A negative result may occur with  improper specimen collection / handling, submission of specimen other  than nasopharyngeal swab, presence of viral mutation(s) within the  areas targeted by this assay, and inadequate number of viral copies  (<250 copies / mL). A negative result must be combined with clinical  observations, patient history, and epidemiological information. If result is POSITIVE SARS-CoV-2 target nucleic acids are DETECTED. The SARS-CoV-2 RNA is generally detectable in upper and lower  respiratory specimens dur ing the acute phase of infection.  Positive  results are indicative of active infection with SARS-CoV-2.  Clinical  correlation with patient history and other diagnostic information is  necessary to determine patient infection  status.  Positive results do  not rule out bacterial infection or co-infection with other viruses. If result is PRESUMPTIVE POSTIVE SARS-CoV-2 nucleic acids MAY BE PRESENT.   A presumptive positive result was obtained on the submitted specimen  and confirmed on repeat testing.  While 2019 novel coronavirus  (SARS-CoV-2) nucleic acids may be present in the submitted sample  additional confirmatory testing may be necessary for epidemiological  and / or clinical management purposes  to differentiate between  SARS-CoV-2 and other Sarbecovirus currently known to infect humans.  If clinically indicated additional testing with an alternate test  methodology [id number]) is advised. The SARS-CoV-2 RNA is generally  detectable in upper and lower respiratory sp ecimens during the acute  phase of infection. The expected result is Negative. Fact Sheet for Patients:  StrictlyIdeas.no Fact Sheet for Healthcare Providers: BankingDealers.co.za This test is not yet approved or cleared by the [place] FDA and has been authorized for detection and/or diagnosis of SARS-CoV-2 by FDA under an Emergency Use Authorization (EUA).  This EUA will remain in effect (meaning this test can be used) for the duration of the COVID-19 declaration under Section 564(b)(1) of the Act, 21 U.S.C. section 360bbb-3(b)(1), unless the authorization is terminated or revoked sooner. Performed at [facility], [place] [address] [place], [place] [address]   MRSA PCR Screening     Status: Abnormal   Collection Time: 06/03/19  6:16 PM   Specimen: Nasopharyngeal  Result Value Ref Range Status   MRSA by PCR POSITIVE (A) NEGATIVE Final    Comment:        The GeneXpert MRSA Assay (FDA approved for NASAL specimens only), is one component of a comprehensive MRSA colonization surveillance program. It is not intended to diagnose MRSA infection nor to guide or monitor  treatment for MRSA infections. RESULT CALLED TO, READ BACK BY AND VERIFIED WITH: J  [facility] RN [id number] 06/04/19 A NAVARRO Performed at [facility], [place] [address] [place] [address], [place], [place] [address]   Novel Coronavirus, NAA (hospital order; send-out to ref lab)     Status: None   Collection Time: 06/07/19 10:50 AM   Specimen: Nasopharyngeal Swab; Respiratory  Result Value Ref Range Status   SARS-CoV-2, NAA NOT DETECTED NOT DETECTED Final    Comment: (NOTE) This test was developed and its performance characteristics determined by [employer]. This test has not been FDA cleared or approved. This test has been authorized by FDA under an Emergency Use Authorization (EUA). This test is only authorized for the duration of time the declaration that circumstances exist justifying the authorization of the emergency use of in vitro diagnostic tests for detection of SARS-CoV-2 virus and/or diagnosis of COVID-19 infection under section 564(b)(1) of the Act, 21 U.S.C. 211ZNB-5(A)(7), unless the authorization is terminated or revoked sooner. When diagnostic testing is negative, the possibility of a false negative result should be considered in the context of a patient's recent exposures and the presence of clinical signs and symptoms consistent with COVID-19. An individual without symptoms of COVID-19 and who is not shedding SARS-CoV-2 virus would expect to have a negative (not detected) result in this assay. Performed  At: [facility] [place], [place] [id number] Rush Farmer MD HY:[id number]    [place]  Final    Comment: Performed at [facility] [address], [place], [place] [address]      Radiology Studies: Dg Abd 1 View  Result Date: 06/11/2019 CLINICAL DATA:  Abdominal distension. EXAM: ABDOMEN - 1 VIEW COMPARISON:  Radiograph June 10, 2019. FINDINGS: The bowel gas pattern is normal. No  radio-opaque calculi or other significant radiographic abnormality are seen. IMPRESSION: Negative. Electronically Signed   By: Marijo Conception M.D.   On: 06/11/2019 13:32        Scheduled Meds: . aspirin  300 mg Rectal Daily  . Chlorhexidine Gluconate Cloth  6 each Topical Daily  . enoxaparin (LOVENOX) injection  0.5 mg/kg Subcutaneous QHS  . insulin aspart  0-15 Units Subcutaneous Q4H  . insulin glargine  20 Units Subcutaneous Daily  . metoprolol tartrate  5 mg Intravenous Q6H  . mupirocin ointment   Nasal BID  . pantoprazole (PROTONIX) IV  40 mg Intravenous Q24H  . sodium chloride flush  3 mL Intravenous Q12H   Continuous Infusions: . sodium chloride    . lactated ringers 75 mL/hr at 06/12/19 1324     LOS: 9 days    Time spent: 25 minutes spent in the coordination of care today.    Jonnie Finner, DO [facility] Pager [phone]  If 7PM-7AM, please contact night-coverage [email or web address] Password [email or web address] 06/12/2019, 1:47 PM

## 2019-06-12 NOTE — Progress Notes (Signed)
Pt was napping and hadn't eaten his lunch tray when I came in.  However, he indicates that his stomach isn't hurting him, and that he's been able to eat ok.  He still wants to go back to [employer].  KUB yesterday showed persistent resol of gastric distension and nl bowel gas pattern.  Potassium today is 3.9.  Abd is adipose and moderately tympanitic on exam, but soft and nontender.  IMPR:  Apparent resolution of gastric distension issue and feeding problems.  PLAN:  1. Will sign off; call if questions, or if further input from [place] is desired. 2. Reasonable for dischg if/when confirmation of adequate food intake is achieved.  Cleotis Nipper, M.D. Pager [phone] If no answer or after 5 PM call [phone]

## 2019-06-13 DIAGNOSIS — K219 Gastro-esophageal reflux disease without esophagitis: Secondary | ICD-10-CM

## 2019-06-13 DIAGNOSIS — I10 Essential (primary) hypertension: Secondary | ICD-10-CM

## 2019-06-13 DIAGNOSIS — I480 Paroxysmal atrial fibrillation: Secondary | ICD-10-CM

## 2019-06-13 LAB — GLUCOSE, CAPILLARY
Glucose-Capillary: 107 mg/dL — ABNORMAL HIGH (ref 70–99)
Glucose-Capillary: 124 mg/dL — ABNORMAL HIGH (ref 70–99)
Glucose-Capillary: 124 mg/dL — ABNORMAL HIGH (ref 70–99)
Glucose-Capillary: 128 mg/dL — ABNORMAL HIGH (ref 70–99)
Glucose-Capillary: 128 mg/dL — ABNORMAL HIGH (ref 70–99)
Glucose-Capillary: 151 mg/dL — ABNORMAL HIGH (ref 70–99)
Glucose-Capillary: 83 mg/dL (ref 70–99)

## 2019-06-13 MED ORDER — FUROSEMIDE 40 MG PO TABS: 40.0000 mg | ORAL_TABLET | Freq: Two times a day (BID) | ORAL | Status: DC

## 2019-06-13 MED ORDER — FUROSEMIDE 10 MG/ML IJ SOLN
40.0000 mg | Freq: Once | INTRAMUSCULAR | Status: AC
  Administered 2019-06-13: 40 mg via INTRAVENOUS
  Filled 2019-06-13: qty 4

## 2019-06-13 NOTE — Care Management Important Message (Signed)
Important Message  Patient Details IM Letter given to Marney Doctor RN to present to the Patient Name: Jared Hall MRN: [id number] Date of Birth: 1931/06/11   Medicare Important Message Given:  Yes     Kerin Salen 06/13/2019, 9:37 AM

## 2019-06-13 NOTE — Progress Notes (Signed)
PT Cancellation Note  Patient Details Name: Jared Hall MRN: [id number] DOB: 1931/03/10   Cancelled Treatment:     Pt refused therapy today. I had a difficult time understanding him. Appears he just wanted to be left alone and did mention wanting to be d/c.    Lelon Mast 06/13/2019, 2:56 PM

## 2019-06-13 NOTE — Progress Notes (Signed)
Jared Hall Kitchen  PROGRESS NOTE    Jared Hall  XNA:[id number] DOB: 11-18-1931 DOA: 06/02/2019 PCP: Jared Huddle, MD   Brief Narrative:   85 y.o.male,w Anemia, Thallassemia, hypertension, hyperlipidemia, Dm2, h/o Stroke, Pafib, CHF (EF 55%), OSA, Bph/ prostate cancer s/p XRT apparently present with c/o nausea as well as abdominal discomfort "cramping". Pt denies fever, chills, cough, cp, palp, constipation, diarrhea, brbpr, black stool.   Assessment & Plan:   Principal Problem:   Hypokalemia Active Problems:   DM (diabetes mellitus) ([place])   History of CVA (cerebrovascular accident)   Essential hypertension, benign   GERD (gastroesophageal reflux disease)   DM type 2 with diabetic peripheral neuropathy (HCC)   Paroxysmal atrial fibrillation (HCC)   Chronic kidney disease, stage III (moderate) (HCC)   Anemia   Hypokalemia likely secondary to diuretics (lasix, metolazone) - resolved; continue aggressive K+ repletion per GI  Pafib , chronic diastolic CHF (EF 54%) - Presently stable at this AM - clinically dehydrated, thus holding further diuretic - Continued on gentle IVF hydration - Pt is continued on IV beta blocker and held plavix. Will continue PR aspirin until pt is able to reliably tolerate PO - resume PO metoprolol, plavix, ASA; d/c IV metoprolol and PR ASA  DM2 w CKD stage3 - Pt had been NPO per below, now on clears post-procedure per GI - decrease lantus to 10 units daily , home dose is 50 units BID - Continue on Q4h SSI coverage - stop fluids  Diabetic neuropathy, Chronic pain - Seems to be stable currently  Hyperlipidemia, h/o CVA - Simvastatin 20mg  po qhs on hold as pt was NPO per below - Cont on aspirin, plavix was on hold as pt is NPO. Will resume if pt tolerates PO reliably - Currently stable     - resumed PO ASA, plavix, zocor   Gerd - Continue on IV PPI  BPH - seems to be stable     -  resume terazosin at discharge  Constipation - Recently with good results from soap suds enema - Recent CT abd/pelvis from 7/19 personally reviewed. Stool noted predominately in ascending and transverse colon  Anemia, Thallasemia - Remains hemodynamically stable  ARF - SCr has improved with IVF hydration - Dry membranes on exam - Cont to hold diuretic per above and will continue gentle IVF hydration - believe he is at his baseline; monitor     - stop fluids     - wet sounding today; resume lasix  Gastric outlet obstruction - Follow up abd xray on 7/19 with findings worrisome for newly distended stomach concerning for gastric outlet obstruction - NG was placed with nearly 1.5L output overnight - Repeat CT abd/pelvis personally reviewed with no acute findings noted. Stool in ascending and transverse colons per my own read - Appreciate input by GI. Pt has since undergone EGD on 7/21 without findings of mass or lesion to explain obstruction. Started trial of clears with NG since removed     - on soft diet; need to see him eating more/tolerating before we send him back; resume PO meds     - tolerating diet  Plan was to send him today, but he sounds wet. Resume lasix. If stable, d/c in AM.   DVT prophylaxis:lovenox Code Status:DNR Disposition Plan:Return to SNF when medically stable   Consultants:   GI   Subjective: "I want to go to [place]!"  Objective: Vitals:   06/12/19 1340 06/12/19 2046 06/13/19 0432 06/13/19 1402  BP: (!) 150/64  123/86 134/61 (!) 125/57  Pulse: 66 77 83 74  Resp: 18 18 19 19   Temp: 97.9 F (36.6 C) 98.1 F (36.7 C) 97.8 F (36.6 C) 98.2 F (36.8 C)  TempSrc: Oral Oral Oral Oral  SpO2: 100% 100% 95% 100%  Weight:   111.3 kg   Height:        Intake/Output Summary (Last 24 hours) at 06/13/2019 1425 Last data filed at 06/13/2019 1401 Gross per 24 hour  Intake 720 ml  Output 1501 ml   Net -781 ml   Filed Weights   06/10/19 1256 06/12/19 0529 06/13/19 0432  Weight: 108.1 kg 110.9 kg 111.3 kg    Examination:  General:85 y.o.maleresting in bed in NAD Cardiovascular: RRR, +S1, S2, no m/g/r, equal pulses throughout Respiratory: b/l rhonchi, upper airway transmission GI: BS+, NDNT, no masses noted, no organomegaly noted MSK: No e/c/c Neuro:alert to name, follows commands    Data Reviewed: I have personally reviewed following labs and imaging studies.  CBC: Recent Labs  Lab 06/09/19 0602 06/12/19 0600  WBC 10.3 9.5  NEUTROABS  --  7.0  HGB 10.8* 10.7*  HCT 36.6* 36.4*  MCV 73.2* 72.9*  PLT 251 629   Basic Metabolic Panel: Recent Labs  Lab 06/08/19 0615 06/09/19 0602 06/10/19 0610 06/11/19 0554 06/12/19 0600  NA 142 144 142 143 144  K 3.7 3.3* 4.0 3.6 3.9  CL 96* 102 106 105 106  CO2 39* 36* 28 30 31   GLUCOSE 161* 78 104* 70 103*  BUN 34* 27* 21 20 16   CREATININE 1.50* 1.37* 1.32* 1.35* 1.21  CALCIUM 9.6 9.5 8.9 9.0 9.0  MG  --  1.9  --   --  1.8  PHOS  --   --   --   --  2.9   GFR: Estimated Creatinine Clearance: 54.7 mL/min (by C-G formula based on SCr of 1.21 mg/dL). Liver Function Tests: Recent Labs  Lab 06/08/19 0615 06/09/19 0602 06/11/19 0554 06/12/19 0600  AST 24 29 23   --   ALT 17 21 20   --   ALKPHOS 57 56 65  --   BILITOT 0.3 0.5 0.9  --   PROT 6.1* 6.1* 6.2*  --   ALBUMIN 3.0* 3.0* 3.0* 2.7*   No results for input(s): LIPASE, AMYLASE in the last 168 hours. No results for input(s): AMMONIA in the last 168 hours. Coagulation Profile: No results for input(s): INR, PROTIME in the last 168 hours. Cardiac Enzymes: No results for input(s): CKTOTAL, CKMB, CKMBINDEX, TROPONINI in the last 168 hours. BNP (last 3 results) No results for input(s): PROBNP in the last 8760 hours. HbA1C: No results for input(s): HGBA1C in the last 72 hours. CBG: Recent Labs  Lab 06/12/19 2050 06/12/19 2342 06/13/19 0438 06/13/19 0756  06/13/19 1121  GLUCAP 88 83 107* 128* 151*   Lipid Profile: No results for input(s): CHOL, HDL, LDLCALC, TRIG, CHOLHDL, LDLDIRECT in the last 72 hours. Thyroid Function Tests: No results for input(s): TSH, T4TOTAL, FREET4, T3FREE, THYROIDAB in the last 72 hours. Anemia Panel: No results for input(s): VITAMINB12, FOLATE, FERRITIN, TIBC, IRON, RETICCTPCT in the last 72 hours. Sepsis Labs: No results for input(s): PROCALCITON, LATICACIDVEN in the last 168 hours.  Recent Results (from the past 240 hour(s))  MRSA PCR Screening     Status: Abnormal   Collection Time: 06/03/19  6:16 PM   Specimen: Nasopharyngeal  Result Value Ref Range Status   MRSA by PCR POSITIVE (A) NEGATIVE Final  Comment:        The GeneXpert MRSA Assay (FDA approved for NASAL specimens only), is one component of a comprehensive MRSA colonization surveillance program. It is not intended to diagnose MRSA infection nor to guide or monitor treatment for MRSA infections. RESULT CALLED TO, READ BACK BY AND VERIFIED WITHMargarita Grizzle RN [id number] 06/04/19 A NAVARRO Performed at [facility], [place] [address], [place], [place] [address]   Novel Coronavirus, NAA (hospital order; send-out to ref lab)     Status: None   Collection Time: 06/07/19 10:50 AM   Specimen: Nasopharyngeal Swab; Respiratory  Result Value Ref Range Status   SARS-CoV-2, NAA NOT DETECTED NOT DETECTED Final    Comment: (NOTE) This test was developed and its performance characteristics determined by [employer]. This test has not been FDA cleared or approved. This test has been authorized by FDA under an Emergency Use Authorization (EUA). This test is only authorized for the duration of time the declaration that circumstances exist justifying the authorization of the emergency use of in vitro diagnostic tests for detection of SARS-CoV-2 virus and/or diagnosis of COVID-19 infection under section 564(b)(1) of the Act, 21 U.S.C.  712RFX-5(O)(8), unless the authorization is terminated or revoked sooner. When diagnostic testing is negative, the possibility of a false negative result should be considered in the context of a patient's recent exposures and the presence of clinical signs and symptoms consistent with COVID-19. An individual without symptoms of COVID-19 and who is not shedding SARS-CoV-2 virus would expect to have a negative (not detected) result in this assay. Performed  At: [facility] [place], [place] [id number] Rush Farmer MD BR:[id number]    [place]  Final    Comment: Performed at [facility] [address], [place], [place] [address]         Radiology Studies: No results found.      Scheduled Meds: . aspirin EC  81 mg Oral Daily  . Chlorhexidine Gluconate Cloth  6 each Topical Daily  . clopidogrel  75 mg Oral Daily  . enoxaparin (LOVENOX) injection  0.5 mg/kg Subcutaneous QHS  . insulin aspart  0-15 Units Subcutaneous Q4H  . insulin glargine  10 Units Subcutaneous Daily  . metoprolol tartrate  25 mg Oral BID  . mupirocin ointment   Nasal BID  . pantoprazole  40 mg Oral Daily  . simvastatin  20 mg Oral QHS  . sodium chloride flush  3 mL Intravenous Q12H   Continuous Infusions: . sodium chloride       LOS: 10 days    Time spent: 25 minutes spent in the coordination of care today.    Jonnie Finner, DO [facility] Pager [phone]  If 7PM-7AM, please contact night-coverage [email or web address] Password [email or web address] 06/13/2019, 2:25 PM

## 2019-06-14 LAB — MAGNESIUM: Magnesium: 2.1 mg/dL (ref 1.7–2.4)

## 2019-06-14 LAB — RENAL FUNCTION PANEL
Albumin: 2.9 g/dL — ABNORMAL LOW (ref 3.5–5.0)
Anion gap: 7 (ref 5–15)
BUN: 16 mg/dL (ref 8–23)
CO2: 30 mmol/L (ref 22–32)
Calcium: 9 mg/dL (ref 8.9–10.3)
Chloride: 104 mmol/L (ref 98–111)
Creatinine, Ser: 1.25 mg/dL — ABNORMAL HIGH (ref 0.61–1.24)
GFR calc Af Amer: >60 mL/min (ref >60)
GFR calc non Af Amer: 52 mL/min — ABNORMAL LOW (ref >60)
Glucose, Bld: 123 mg/dL — ABNORMAL HIGH (ref 70–99)
Phosphorus: 2.8 mg/dL (ref 2.5–4.6)
Potassium: 3.7 mmol/L (ref 3.5–5.1)
Sodium: 141 mmol/L (ref 135–145)

## 2019-06-14 LAB — CBC WITH DIFFERENTIAL/PLATELET
Abs Immature Granulocytes: 0.06 10*3/uL (ref 0.00–0.07)
Basophils Absolute: 0 10*3/uL (ref 0.0–0.1)
Basophils Relative: 0 %
Eosinophils Absolute: 0.2 10*3/uL (ref 0.0–0.5)
Eosinophils Relative: 2 %
HCT: 35.1 % — ABNORMAL LOW (ref 39.0–52.0)
Hemoglobin: 10.4 g/dL — ABNORMAL LOW (ref 13.0–17.0)
Immature Granulocytes: 1 %
Lymphocytes Relative: 16 %
Lymphs Abs: 1.5 10*3/uL (ref 0.7–4.0)
MCH: 21.8 pg — ABNORMAL LOW (ref 26.0–34.0)
MCHC: 29.6 g/dL — ABNORMAL LOW (ref 30.0–36.0)
MCV: 73.6 fL — ABNORMAL LOW (ref 80.0–100.0)
Monocytes Absolute: 1 10*3/uL (ref 0.1–1.0)
Monocytes Relative: 11 %
Neutro Abs: 6.7 10*3/uL (ref 1.7–7.7)
Neutrophils Relative %: 70 %
Platelets: 242 10*3/uL (ref 150–400)
RBC: 4.77 MIL/uL (ref 4.22–5.81)
RDW: 15.7 % — ABNORMAL HIGH (ref 11.5–15.5)
WBC: 9.5 10*3/uL (ref 4.0–10.5)
nRBC: 0 % (ref 0.0–0.2)

## 2019-06-14 LAB — GLUCOSE, CAPILLARY
Glucose-Capillary: 106 mg/dL — ABNORMAL HIGH (ref 70–99)
Glucose-Capillary: 115 mg/dL — ABNORMAL HIGH (ref 70–99)
Glucose-Capillary: 167 mg/dL — ABNORMAL HIGH (ref 70–99)

## 2019-06-14 MED ORDER — FUROSEMIDE 40 MG PO TABS
80.0000 mg | ORAL_TABLET | Freq: Two times a day (BID) | ORAL | Status: DC
  Administered 2019-06-14 (×2): 80 mg via ORAL
  Filled 2019-06-14 (×2): qty 2

## 2019-06-14 MED ORDER — INSULIN GLARGINE 100 UNIT/ML ~~LOC~~ SOLN: 10.0000 [IU] | Freq: Every day | SUBCUTANEOUS | 0 refills | Status: AC

## 2019-06-14 NOTE — Progress Notes (Signed)
Pt leaving this evening with [facility], headed to [employer]. Family and facility aware.

## 2019-06-14 NOTE — Discharge Summary (Signed)
. Physician Discharge Summary  Jared Hall XBL:[id number] DOB: [birth year] DOA: 06/02/2019  PCP: Josetta Huddle, MD  Admit date: 06/02/2019 Discharge date: 06/14/2019  Admitted From: [facility] Disposition:  Discharged to [facility]  Recommendations for Outpatient Follow-up:  1. Follow up with PCP in 3 - 5 days. Check BP/glucose/potassium. 2. Please obtain BMP/CBC 3 - 5 days.  Discharge Condition: Stable  CODE STATUS: DNR   Brief/Interim Summary: 85 y.o.male,w Anemia, Thallassemia, hypertension, hyperlipidemia, Dm2, h/o Stroke, Pafib, CHF (EF 55%), OSA, Bph/ prostate cancer s/p XRT apparently present with c/o nausea as well as abdominal discomfort "cramping". Pt denies fever, chills, cough, cp, palp, constipation, diarrhea, brbpr, black stool.  Discharge Diagnoses:  Principal Problem:   Hypokalemia Active Problems:   DM (diabetes mellitus) ([place])   History of CVA (cerebrovascular accident)   Essential hypertension, benign   GERD (gastroesophageal reflux disease)   DM type 2 with diabetic peripheral neuropathy (HCC)   Paroxysmal atrial fibrillation (HCC)   Chronic kidney disease, stage III (moderate) (HCC)   Anemia  Hypokalemia likely secondary to diuretics (lasix, metolazone) - resolved; continue aggressive K+ repletion per GI  Pafib , chronic diastolic CHF (EF 30%) - Presently stable at this AM - clinically dehydrated, thus holding further diuretic - Continued on gentle IVF hydration - Pt is continued on IV beta blocker and held plavix. Will continue PR aspirin until pt is able to reliably tolerate PO -resume PO metoprolol, plavix, ASA; d/c IV metoprolol and PR ASA  DM2 w CKD stage3 - Pt had been NPO per below, now on clears post-procedure per GI -decreaselantus to 10 units daily , home dose is 50 units BID - Continue on Q4h SSI coverage -stop fluids  Diabetic neuropathy, Chronic pain - Seems to be stable  currently  Hyperlipidemia, h/o CVA - Simvastatin 20mg  po qhs on hold as pt was NPO per below - Cont on aspirin, plavix was on hold as pt is NPO. Will resume if pt tolerates PO reliably - Currently stable - resumed PO ASA, plavix, zocor  Gerd - Continue on IV PPI  BPH - seems to be stable  Constipation - Recently with good results from soap suds enema - Recent CT abd/pelvis from 7/19 personally reviewed. Stool noted predominately in ascending and transverse colon  Anemia, Thallasemia - Remains hemodynamically stable  ARF - SCr has improved with IVF hydration - Dry membranes on exam - Cont to hold diuretic per above and will continue gentle IVF hydration - believe he is at his baseline; monitor - stop fluids - wet sounding today; resume lasix     - SCr ok; lung exam improved, tolerating lasix  Gastric outlet obstruction - Follow up abd xray on 7/19 with findings worrisome for newly distended stomach concerning for gastric outlet obstruction - NG was placed with nearly 1.5L output overnight - Repeat CT abd/pelvis personally reviewed with no acute findings noted. Stool in ascending and transverse colons per my own read - Appreciate input by GI. Pt has since undergone EGD on 7/21 without findings of mass or lesion to explain obstruction. Started trial of clears with NG since removed - on soft diet; need to see him eating more/tolerating before we send him back; resume PO meds     - tolerating diet  Lung exam improved on lasix today. Some upper field wheeze, but says he is comfortable and he's satting well on home O2 levels. Tolerating lasix. Ok to discharge to [facility] today.  Discharge Instructions  Allergies as of 06/14/2019      Reactions   Ativan [lorazepam] Other (See Comments)   Flailing of extremities noted immediately s/p IV administration.   Flomax [tamsulosin Hcl] Other  (See Comments)   Excessive tearing   Glucophage [metformin Hcl] Diarrhea      Medication List    STOP taking these medications   cyclobenzaprine 5 MG tablet Commonly known as: FLEXERIL   doxycycline 100 MG tablet Commonly known as: VIBRA-TABS   fluticasone 50 MCG/ACT nasal spray Commonly known as: FLONASE   gabapentin 100 MG capsule Commonly known as: NEURONTIN   terazosin 2 MG capsule Commonly known as: HYTRIN     TAKE these medications   acetaminophen 500 MG tablet Commonly known as: TYLENOL Take 500 mg by mouth every 6 (six) hours as needed for fever.   amitriptyline 10 MG tablet Commonly known as: ELAVIL Take 10 mg by mouth at bedtime. What changed: Another medication with the same name was removed. Continue taking this medication, and follow the directions you see here.   aspirin EC 81 MG tablet Take 81 mg by mouth daily.   azelastine 0.05 % ophthalmic solution Commonly known as: OPTIVAR Place 1 drop into both eyes 2 (two) times daily as needed (dry eyes).   Cepacol 15-2.3 MG Lozg Generic drug: Benzocaine-Menthol Use as directed 1 lozenge in the mouth or throat 4 (four) times daily as needed (cough).   clopidogrel 75 MG tablet Commonly known as: PLAVIX Take 75 mg by mouth daily.   furosemide 80 MG tablet Commonly known as: LASIX Take 80 mg by mouth 2 (two) times daily.   insulin glargine 100 UNIT/ML injection Commonly known as: LANTUS Inject 0.1 mLs (10 Units total) into the skin daily. What changed:   how much to take  when to take this   metolazone 5 MG tablet Commonly known as: ZAROXOLYN Take 5 mg by mouth as directed. Take 1 tablet every Tues & Fri. Hold If No Leg Edema   metoprolol tartrate 25 MG tablet Commonly known as: LOPRESSOR Take 1 tablet (25 mg total) by mouth 2 (two) times daily.   NovoLOG FlexPen 100 UNIT/ML FlexPen Generic drug: insulin aspart Inject 5 Units into the skin 3 (three) times daily with meals. What changed:  Another medication with the same name was removed. Continue taking this medication, and follow the directions you see here.   pantoprazole 40 MG tablet Commonly known as: PROTONIX Take 40 mg by mouth daily.   potassium chloride SA 20 MEQ tablet Commonly known as: K-DUR Take 20-40 mEq by mouth as directed. Take 1 tablet on (Sun, Mon, Wed, Thurs & Sat.) & Take 2 tablets on (Tues & Fri) if Metolazone is taken.   senna-docusate 8.6-50 MG tablet Commonly known as: Senokot-S Take 1 tablet by mouth 2 (two) times daily.   simvastatin 20 MG tablet Commonly known as: ZOCOR Take 20 mg by mouth daily.   Systane 0.4-0.3 % Gel ophthalmic gel Generic drug: Polyethyl Glycol-Propyl Glycol Place 1 application into both eyes 3 (three) times daily as needed (dry eyes).   traMADol 50 MG tablet Commonly known as: ULTRAM Take 1 tablet (50 mg total) by mouth every 6 (six) hours as needed for moderate pain.   vitamin B-12 1000 MCG tablet Commonly known as: CYANOCOBALAMIN Take 1,000 mcg by mouth daily.   Vitamin D (Cholecalciferol) 50 MCG (2000 UT) Caps Take 1 capsule by mouth daily.       Allergies  Allergen Reactions  .  Ativan [Lorazepam] Other (See Comments)    Flailing of extremities noted immediately s/p IV administration.  [name] Kitchen Flomax [Tamsulosin Hcl] Other (See Comments)    Excessive tearing  . Glucophage [Metformin Hcl] Diarrhea    Consultations:  GI   Procedures/Studies: Ct Abdomen Pelvis Wo Contrast  Result Date: 06/08/2019 CLINICAL DATA:  Abdominal distension. Gastric outlet obstruction, new since 06/02/2019. Nausea and abdominal cramping pain. History of prostate cancer treated with radiation therapy. EXAM: CT ABDOMEN AND PELVIS WITHOUT CONTRAST TECHNIQUE: Multidetector CT imaging of the abdomen and pelvis was performed following the standard protocol without IV contrast. COMPARISON:  06/02/2019 and 04/04/2013. FINDINGS: Lower chest: Mitral valve annular calcifications and minimal  coronary artery calcification. Minimal bibasilar atelectasis with improvement. Hepatobiliary: No focal liver abnormality is seen. No gallstones, gallbladder wall thickening, or biliary dilatation. Pancreas: Unremarkable. No pancreatic ductal dilatation or surrounding inflammatory changes. The small cystic area seen in the pancreatic body on 04/04/2013 is not well visualized without intravenous contrast today, grossly unchanged. Spleen: Normal in size without focal abnormality. Adrenals/Urinary Tract: Normal appearing adrenal glands. Stable right renal cysts with a tiny calcification in an upper pole cyst. Unremarkable left kidney, ureters and urinary bladder. Stomach/Bowel: Interval nasogastric tube with its tip in the mid to distal stomach. Normal appearing pylorus. No obstructing mass seen. Redundant sigmoid colon with minimal proximal diverticulosis. No evidence of diverticulitis. Unremarkable small bowel and appendix. Vascular/Lymphatic: Atheromatous arterial calcifications without aneurysm. Single enlarged right para-aortic lymph node inferior to the level of the renal arteries with a short axis diameter of 14 mm on image number 41 series 2 this has not changed significantly since 04/04/2013, compatible with a benign process, most likely chronic reactive. No other enlarged lymph nodes. Reproductive: Normal sized prostate gland with adjacent surgical clips or radiation seeds. Other: Small to moderate-sized right inguinal hernia containing fat and small left inguinal hernia containing fat without significant change since 04/04/2013. Musculoskeletal: Lumbar and lower thoracic spine degenerative changes and mild scoliosis. Straightening of the normal lumbar lordosis. No evidence of bony metastatic disease. IMPRESSION: No acute abnormality. Electronically Signed   By: Claudie Revering M.D.   On: 06/08/2019 17:14   Ct Abdomen Pelvis Wo Contrast  Result Date: 06/02/2019 CLINICAL DATA:  Abdominal pain for several days  EXAM: CT ABDOMEN AND PELVIS WITHOUT CONTRAST TECHNIQUE: Multidetector CT imaging of the abdomen and pelvis was performed following the standard protocol without IV contrast. COMPARISON:  04/04/2013 FINDINGS: Lower chest: Lung bases demonstrate mild scarring in the left base. Mild right basilar atelectasis is seen without acute effusion. Liver is within normal limits. The gallbladder is well distended with some gallstones within. No obstructive changes are seen. Hepatobiliary: No focal liver abnormality is seen. No gallstones, gallbladder wall thickening, or biliary dilatation. Pancreas: Unremarkable. No pancreatic ductal dilatation or surrounding inflammatory changes. Spleen: Normal in size without focal abnormality. Adrenals/Urinary Tract: Adrenal glands are within normal limits. Kidneys are well visualized bilaterally with tiny nonobstructing renal stone in the upper pole of the right kidney. Large exophytic cyst from the right kidney is seen. It measures approximately 6.1 cm in greatest dimension. This has enlarged in the interval from the prior study. No obstructive changes are seen. The ureters are well visualized to the bladder. The bladder is decompressed. Stomach/Bowel: The appendix is within normal limits. No obstructive or inflammatory changes of the larger small-bowel are seen. The stomach is decompressed. Vascular/Lymphatic: Aortic atherosclerosis. No enlarged abdominal or pelvic lymph nodes. Reproductive: Prostate is unremarkable. Other: No abdominal wall hernia  or abnormality. No abdominopelvic ascites. Musculoskeletal: Degenerative changes of lumbar spine are noted. IMPRESSION: Mild right basilar atelectasis. Tiny nonobstructing right renal stone. Right renal cysts larger than that seen on the prior exam. No other acute abnormality is seen. Electronically Signed   By: Inez Catalina M.D.   On: 06/02/2019 17:59   Dg Abd 1 View  Result Date: 06/11/2019 CLINICAL DATA:  Abdominal distension. EXAM:  ABDOMEN - 1 VIEW COMPARISON:  Radiograph June 10, 2019. FINDINGS: The bowel gas pattern is normal. No radio-opaque calculi or other significant radiographic abnormality are seen. IMPRESSION: Negative. Electronically Signed   By: Marijo Conception M.D.   On: 06/11/2019 13:32   Dg Chest Port 1 View  Result Date: 06/02/2019 CLINICAL DATA:  Fever EXAM: PORTABLE CHEST 1 VIEW COMPARISON:  05/27/2018 FINDINGS: Bibasilar atelectasis left greater than right is unchanged. Negative for heart failure. No definite pleural effusion. Small left effusion not excluded. Atherosclerotic aortic arch. IMPRESSION: Bibasilar atelectasis/infiltrate left greater than right unchanged from 05/27/2018. No new findings. Electronically Signed   By: Franchot Gallo M.D.   On: 06/02/2019 21:13   Dg Abd Portable 1v  Result Date: 06/10/2019 CLINICAL DATA:  85 year old male status post NG tube placement. EXAM: PORTABLE ABDOMEN - 1 VIEW COMPARISON:  CT of the abdomen pelvis dated 06/08/2019 FINDINGS: Partially visualized enteric tube with side-port in the body of the stomach and tip in the distal body. Moderate stool noted in the visualized colon. Density at the left lung base may represent atelectasis. Clinical correlation is recommended. IMPRESSION: Partially visualized enteric tube with tip in the distal body of the stomach. Electronically Signed   By: Anner Crete M.D.   On: 06/10/2019 01:16   Dg Abd Portable 1v  Result Date: 06/08/2019 CLINICAL DATA:  Abdominal distension and pain. EXAM: PORTABLE ABDOMEN - 1 VIEW COMPARISON:  CT 06/02/2019 and KUB 01/07/2017 FINDINGS: There is extensive gaseous distention of the stomach new since 06/02/2019. Air is present throughout the colon. No free peritoneal air. Remainder of the exam is unchanged. IMPRESSION: Extensive gaseous distention of the stomach new since 06/02/2019 which may be due to gastric outlet obstruction. Electronically Signed   By: Marin Olp M.D.   On: 06/08/2019 13:29       Subjective: "Please let me go."  Discharge Exam: Vitals:   06/13/19 2117 06/14/19 0443  BP: 137/65 (!) 139/55  Pulse: 64 69  Resp: 18 18  Temp: 98.2 F (36.8 C) 98.4 F (36.9 C)  SpO2: 100% 100%   Vitals:   06/13/19 0432 06/13/19 1402 06/13/19 2117 06/14/19 0443  BP: 134/61 (!) 125/57 137/65 (!) 139/55  Pulse: 83 74 64 69  Resp: 19 19 18 18   Temp: 97.8 F (36.6 C) 98.2 F (36.8 C) 98.2 F (36.8 C) 98.4 F (36.9 C)  TempSrc: Oral Oral Oral Oral  SpO2: 95% 100% 100% 100%  Weight: 111.3 kg   110 kg  Height:        General:85 y.o.maleresting in bed in NAD Cardiovascular: RRR, +S1, S2, no m/g/r, equal pulses throughout Respiratory: left upper lobe wheeze, normal WOB GI: BS+, NDNT, no masses noted, no organomegaly noted MSK: No e/c/c Neuro:alert to name, follows commands   The results of significant diagnostics from this hospitalization (including imaging, microbiology, ancillary and laboratory) are listed below for reference.     Microbiology: Recent Results (from the past 240 hour(s))  Novel Coronavirus, NAA (hospital order; send-out to ref lab)     Status: None  Collection Time: 06/07/19 10:50 AM   Specimen: Nasopharyngeal Swab; Respiratory  Result Value Ref Range Status   SARS-CoV-2, NAA NOT DETECTED NOT DETECTED Final    Comment: (NOTE) This test was developed and its performance characteristics determined by [employer]. This test has not been FDA cleared or approved. This test has been authorized by FDA under an Emergency Use Authorization (EUA). This test is only authorized for the duration of time the declaration that circumstances exist justifying the authorization of the emergency use of in vitro diagnostic tests for detection of SARS-CoV-2 virus and/or diagnosis of COVID-19 infection under section 564(b)(1) of the Act, 21 U.S.C. 417EYC-1(K)(4), unless the authorization is terminated or revoked sooner. When diagnostic testing is  negative, the possibility of a false negative result should be considered in the context of a patient's recent exposures and the presence of clinical signs and symptoms consistent with COVID-19. An individual without symptoms of COVID-19 and who is not shedding SARS-CoV-2 virus would expect to have a negative (not detected) result in this assay. Performed  At: [facility] [place], [place] [id number] Rush Farmer MD FW:[id number]    [place]  Final    Comment: Performed at [facility] [address], [place], [place] [address]     Labs: BNP (last 3 results) No results for input(s): BNP in the last 8760 hours. Basic Metabolic Panel: Recent Labs  Lab 06/09/19 0602 06/10/19 0610 06/11/19 0554 06/12/19 0600 06/14/19 0619  NA 144 142 143 144 141  K 3.3* 4.0 3.6 3.9 3.7  CL 102 106 105 106 104  CO2 36* 28 30 31 30   GLUCOSE 78 104* 70 103* 123*  BUN 27* 21 20 16 16   CREATININE 1.37* 1.32* 1.35* 1.21 1.25*  CALCIUM 9.5 8.9 9.0 9.0 9.0  MG 1.9  --   --  1.8 2.1  PHOS  --   --   --  2.9 2.8   Liver Function Tests: Recent Labs  Lab 06/08/19 0615 06/09/19 0602 06/11/19 0554 06/12/19 0600 06/14/19 0619  AST 24 29 23   --   --   ALT 17 21 20   --   --   ALKPHOS 57 56 65  --   --   BILITOT 0.3 0.5 0.9  --   --   PROT 6.1* 6.1* 6.2*  --   --   ALBUMIN 3.0* 3.0* 3.0* 2.7* 2.9*   No results for input(s): LIPASE, AMYLASE in the last 168 hours. No results for input(s): AMMONIA in the last 168 hours. CBC: Recent Labs  Lab 06/09/19 0602 06/12/19 0600 06/14/19 0619  WBC 10.3 9.5 9.5  NEUTROABS  --  7.0 6.7  HGB 10.8* 10.7* 10.4*  HCT 36.6* 36.4* 35.1*  MCV 73.2* 72.9* 73.6*  PLT 251 200 242   Cardiac Enzymes: No results for input(s): CKTOTAL, CKMB, CKMBINDEX, TROPONINI in the last 168 hours. BNP: Invalid input(s): POCBNP CBG: Recent Labs  Lab 06/13/19 1505 06/13/19 2116 06/13/19 2337  06/14/19 0441 06/14/19 0723  GLUCAP 124* 128* 124* 106* 115*   D-Dimer No results for input(s): DDIMER in the last 72 hours. Hgb A1c No results for input(s): HGBA1C in the last 72 hours. Lipid Profile No results for input(s): CHOL, HDL, LDLCALC, TRIG, CHOLHDL, LDLDIRECT in the last 72 hours. Thyroid function studies No results for input(s): TSH, T4TOTAL, T3FREE, THYROIDAB in the last 72 hours.  Invalid input(s): FREET3 Anemia work up No results for input(s): VITAMINB12, FOLATE, FERRITIN, TIBC,  IRON, RETICCTPCT in the last 72 hours. Urinalysis    Component Value Date/Time   COLORURINE YELLOW 05/22/2018 1945   APPEARANCEUR HAZY (A) 05/22/2018 1945   LABSPEC 1.009 05/22/2018 1945   PHURINE 6.0 05/22/2018 1945   GLUCOSEU NEGATIVE 05/22/2018 1945   HGBUR MODERATE (A) 05/22/2018 1945   BILIRUBINUR NEGATIVE 05/22/2018 1945   KETONESUR NEGATIVE 05/22/2018 1945   PROTEINUR NEGATIVE 05/22/2018 1945   UROBILINOGEN 1.0 12/06/2014 1414   NITRITE NEGATIVE 05/22/2018 1945   LEUKOCYTESUR SMALL (A) 05/22/2018 1945   Sepsis Labs Invalid input(s): PROCALCITONIN,  WBC,  LACTICIDVEN Microbiology Recent Results (from the past 240 hour(s))  Novel Coronavirus, NAA (hospital order; send-out to ref lab)     Status: None   Collection Time: 06/07/19 10:50 AM   Specimen: Nasopharyngeal Swab; Respiratory  Result Value Ref Range Status   SARS-CoV-2, NAA NOT DETECTED NOT DETECTED Final    Comment: (NOTE) This test was developed and its performance characteristics determined by [employer]. This test has not been FDA cleared or approved. This test has been authorized by FDA under an Emergency Use Authorization (EUA). This test is only authorized for the duration of time the declaration that circumstances exist justifying the authorization of the emergency use of in vitro diagnostic tests for detection of SARS-CoV-2 virus and/or diagnosis of COVID-19 infection under section 564(b)(1) of the  Act, 21 U.S.C. 170YFV-4(B)(4), unless the authorization is terminated or revoked sooner. When diagnostic testing is negative, the possibility of a false negative result should be considered in the context of a patient's recent exposures and the presence of clinical signs and symptoms consistent with COVID-19. An individual without symptoms of COVID-19 and who is not shedding SARS-CoV-2 virus would expect to have a negative (not detected) result in this assay. Performed  At: [facility] [place], [place] [id number] Rush Farmer MD WG:[id number]    [place]  Final    Comment: Performed at [facility] [address], [place], [place] [address]     Time coordinating discharge: 35 minutes  SIGNED:   Jonnie Finner, DO  [facility] 06/14/2019, 10:38 AM Pager   If 7PM-7AM, please contact night-coverage [email or web address] Password [email or web address]

## 2019-06-14 NOTE — Progress Notes (Addendum)
CSW received a call from [place] at [facility] stating the patient has been offered a bed and has been accepted and that the pt can arrive on 7/25.  The pt's accepting doctor is SNF MD.  The private room number will be 13 at [facility] at [facility]  The number for report is [phone].  RN updated.  CSW sent D/C Summary via the hub, Kirstin aware.  CSW will update RN and EDP.  Alphonse Guild. Lorrinda Ramstad, Latanya Presser, LCAS Clinical Social Worker Ph: [phone]

## 2019-06-14 NOTE — NC FL2 (Signed)
[place] MEDICAID FL2 LEVEL OF CARE SCREENING TOOL     IDENTIFICATION  Patient Name: Jared Hall Birthdate: 11-21-1930 Sex: male Admission Date (Current Location): 06/02/2019  [facility] and [place] Number:  Herbalist and Address:  [facility],  [place], [place]      Provider Number: [id number]  Attending Physician Name and Address:  Jonnie Finner, DO  Relative Name and Phone Number:  [phone] son [name]    Current Level of Care: Hospital Recommended Level of Care: [place] Prior Approval Number:    Date Approved/Denied:   PASRR Number: [id number]  Discharge Plan: ([facility])    Current Diagnoses: Patient Active Problem List   Diagnosis Date Noted  . Hypokalemia 06/02/2019  . TIA (transient ischemic attack) 05/22/2018  . Cellulitis in diabetic foot ([place]) 05/22/2018  . Tremor 10/17/2017  . Acute encephalopathy   . Acute ischemic stroke ([place])   . Generalized weakness   . Cerebrovascular disease   . Altered mental status   . Acute respiratory failure with hypercapnia ([place])   . CVA (cerebral vascular accident) ([place]) 01/01/2017  . Dehydration 01/01/2017  . Anemia 01/01/2017  . Acute respiratory failure ([place]) 12/15/2016  . Morbid obesity ([place]) 12/15/2016  . Hard of hearing 12/15/2016  . Chronic kidney disease, stage III (moderate) ([place]) 12/06/2016  . Bell's palsy   . Facial droop   . Paroxysmal atrial fibrillation (HCC)   . Embolic stroke ([place])   . Dysphagia 03/14/2015  . Dysarthria 03/14/2015  . Stroke ([place]) 03/14/2015  . Cellulitis 01/11/2014  . Bilateral lower extremity edema 01/11/2014  . Venous stasis dermatitis 01/11/2014  . DM type 2 with diabetic peripheral neuropathy ([place]) 01/11/2014  . Cellulitis and abscess of leg 01/11/2014  . Cellulitis and abscess 01/11/2014  . Depression 06/15/2013  . GERD (gastroesophageal reflux disease) 06/15/2013  . Constipation 06/15/2013  . Diabetic  neuropathy ([place]) 06/15/2013  . Type 1 diabetes mellitus with peripheral circulatory complications ([place]) 82/70/7867  . Essential hypertension, benign 04/07/2013  . AKI (acute kidney injury) ([place]) 04/04/2013  . Bilateral lower leg cellulitis 04/04/2013  . Right hip pain 04/04/2013  . Renal insufficiency 12/16/2011  . History of CVA (cerebrovascular accident) 12/16/2011  . Dyslipidemia 12/16/2011  . Obesity (BMI 30-39.9) 12/16/2011  . Umbilical hernia with obstruction-partial on CT scan; easily reducible 12/14/2011  . History of PSVT (paroxysmal supraventricular tachycardia) 12/14/2011  . DM (diabetes mellitus) ([place]) 12/14/2011    Orientation RESPIRATION BLADDER Height & Weight     Self, Time, Situation, Place  O2(2 liters nasal cannula) Incontinent Weight: 242 lb 8.1 oz (110 kg) Height:  5\' 10"  (177.8 cm)  BEHAVIORAL SYMPTOMS/MOOD NEUROLOGICAL BOWEL NUTRITION STATUS      Continent Diet  AMBULATORY STATUS COMMUNICATION OF NEEDS Skin   Extensive Assist Verbally Normal                       Personal Care Assistance Level of Assistance  Bathing, Dressing Bathing Assistance: Limited assistance Feeding assistance: Independent Dressing Assistance: Limited assistance     Functional Limitations Info  Sight, Hearing, Speech Sight Info: Adequate Hearing Info: Adequate Speech Info: Adequate    SPECIAL CARE FACTORS FREQUENCY  PT (By licensed PT), OT (By licensed OT)     PT Frequency: 5x OT Frequency: 5x            Contractures Contractures Info: Not present    Additional Factors Info  Code Status,  Allergies Code Status Info: DNR Allergies Info: Ativan (Lorazepam), Flomax (Tamsulosin Hcl), Glucophage (Metformin Hcl)           Current Medications (06/14/2019):  This is the current hospital active medication list Current Facility-Administered Medications  Medication Dose Route Frequency Provider Last Rate Last Dose  . 0.9 %  sodium chloride infusion  250 mL  Intravenous PRN Buccini, Robert, MD      . acetaminophen (TYLENOL) tablet 650 mg  650 mg Oral Q6H PRN Buccini, Robert, MD   650 mg at 06/07/19 1635   Or  . acetaminophen (TYLENOL) suppository 650 mg  650 mg Rectal Q6H PRN Buccini, Robert, MD      . aspirin EC tablet 81 mg  81 mg Oral Daily Kyle, Tyrone A, DO   81 mg at 06/14/19 1055  . Chlorhexidine Gluconate Cloth 2 % PADS 6 each  6 each Topical Daily Ronald Lobo, MD   6 each at 06/14/19 1056  . clopidogrel (PLAVIX) tablet 75 mg  75 mg Oral Daily Kyle, Tyrone A, DO   75 mg at 06/14/19 1055  . enoxaparin (LOVENOX) injection 55 mg  0.5 mg/kg Subcutaneous QHS Buccini, Robert, MD   55 mg at 06/13/19 2134  . furosemide (LASIX) tablet 80 mg  80 mg Oral BID Marylyn Ishihara, Tyrone A, DO   80 mg at 06/14/19 0844  . hydrALAZINE (APRESOLINE) injection 10 mg  10 mg Intravenous Q6H PRN Ronald Lobo, MD   10 mg at 06/08/19 2104  . insulin aspart (novoLOG) injection 0-15 Units  0-15 Units Subcutaneous Q4H Ronald Lobo, MD   3 Units at 06/14/19 1227  . insulin glargine (LANTUS) injection 10 Units  10 Units Subcutaneous Daily Kyle, Tyrone A, DO   10 Units at 06/14/19 1056  . metoprolol tartrate (LOPRESSOR) tablet 25 mg  25 mg Oral BID Marylyn Ishihara, Tyrone A, DO   25 mg at 06/14/19 1056  . morphine 2 MG/ML injection 1 mg  1 mg Intravenous Q4H PRN Ronald Lobo, MD   1 mg at 06/09/19 0412  . mupirocin ointment (BACTROBAN) 2 %   Nasal BID Buccini, Robert, MD      . ondansetron [facility]) injection 4 mg  4 mg Intravenous Q6H PRN Ronald Lobo, MD   4 mg at 06/07/19 1437  . pantoprazole (PROTONIX) EC tablet 40 mg  40 mg Oral Daily Kyle, Tyrone A, DO   40 mg at 06/14/19 1057  . polyvinyl alcohol (LIQUIFILM TEARS) 1.4 % ophthalmic solution 1 drop  1 drop Both Eyes TID PRN Buccini, Robert, MD      . simvastatin (ZOCOR) tablet 20 mg  20 mg Oral QHS Kyle, Tyrone A, DO   20 mg at 06/13/19 2134  . sodium chloride flush (NS) 0.9 % injection 3 mL  3 mL Intravenous Q12H Buccini,  Robert, MD   3 mL at 06/14/19 1057  . sodium chloride flush (NS) 0.9 % injection 3 mL  3 mL Intravenous PRN Buccini, [name], MD         Discharge Medications: Please see discharge summary for a list of discharge medications.  Relevant Imaging Results:  Relevant Lab Results:   Additional Information [id number]  Alphonse Guild Sylvan Sookdeo, LCSW

## 2019-06-14 NOTE — Progress Notes (Signed)
Writer spoke with "T[place]" from [facility] at this time, who stated they have all the paperwork they need and pt can be transferred there. Will call PT[facility].

## 2019-06-14 NOTE — TOC Progression Note (Signed)
Transition of Care [facility]) - Progression Note    Patient Details  Name: Jared Hall MRN: [id number] Date of Birth: [birth year]  [facility]) CM/SW Contact  Joaquin Courts, RN Phone Number: 06/14/2019, 3:14 PM  Clinical Narrative:    CM [facility] to [facility] and spoke with Denny Peon who informed CM that home health agencies are not allowed at this time in the facility due to Covid. CM will be unable to arrange home health. CSW Roderic Palau informed of this and will follow up with patient and family.      Barriers to Discharge: No Barriers Identified  Expected Discharge Plan and Services           Expected Discharge Date: 06/14/19                         HH Arranged: PT, OT, RN           Social Determinants of Health (SDOH) Interventions    Readmission Risk Interventions Readmission Risk Prevention Plan 06/09/2019  Transportation Screening Complete  PCP or Specialist Appt within 3-5 Days Not Complete  Not Complete comments not ready for d/c, plan to go to SNF  [place] or [place] Complete  Social Work Consult for [place] Planning/Counseling Complete  Palliative Care Screening Not Applicable  Medication Review Press photographer) Complete  Some recent data might be hidden

## 2019-06-14 NOTE — Progress Notes (Signed)
CSW called [facility] (non-emergency dispatch at ph: [phone] option 1 and 3) and scheduled [facility] to come no sooner than 5:30pm at pt's RN's request..  Dispatch is aware and has pt's RN's # at ph: [phone] and CSW's # at [phone].  RN updated.  Please reconsult if future social work needs arise.  CSW signing off, as social work intervention is no longer needed.  Alphonse Guild. Ryoma Nofziger, LCSW, LCAS, CSI Transitions of Care Clinical Social Worker Care Coordination Department Ph: [phone]

## 2019-06-14 NOTE — Progress Notes (Signed)
CSW spoke to the pt's RN to advise her [facility] SNF is ready to accept the pt.  RN aware.  CSW called pt's DIL who stated it was really up to the pt, but that she would call family to get their opinion, but advised that the CSW should talk to the pt and get his decision.  CSW will continue to follow for D/C needs.  Alphonse Guild. Jimmie Rueter, LCSW, LCAS, CSI Transitions of Care Clinical Social Worker Care Coordination Department Ph: [phone]

## 2019-06-14 NOTE — Progress Notes (Addendum)
Pt talked to pt who insisted on returning to [facility] and despite counseling multiple times about safety and the need for rehab for a safe return to [facility] following [employer], pt adamantly refuses, states he has been to [facility] multiple times and only will D/C to [facility].  CSW spoke to [place] at [facility] at [phone] stating the patient has been offered a bed to return and has been accepted and that the pt can arrive on 7/25.  The room number will be D-4.  The number for report is [id number].  Per [facility] [employer], pt would need a new FL-2, a D/C Summary and any new medications. Would need to be faxed to: [phone]  CSW will update RN and EDP.  Alphonse Guild. Devonna Oboyle, Latanya Presser, LCAS Clinical Social Worker Ph: [phone]

## 2019-06-14 NOTE — Progress Notes (Signed)
D/C packet placed in pt's chart.  RN aware.  RN will update CSW as to when to call PTAR.  CSW standing by.  CSW will continue to follow for D/C needs.  Alphonse Guild. Emslee Lopezmartinez, LCSW, LCAS, CSI Transitions of Care Clinical Social Worker Care Coordination Department Ph: [phone]

## 2019-06-14 NOTE — TOC Transition Note (Signed)
Transition of Care [facility]) - CM/SW Discharge Note   Patient Details  Name: Jared Hall MRN: [id number] Date of Birth: 18-May-1931  Transition of Care [facility]) CM/SW Contact:  Claudine Mouton, LCSW Phone Number: 06/14/2019, 3:11 PM   Clinical Narrative:      Pt talked to pt who insisted on returning to [facility] and despite counseling multiple times about safety and the need for rehab for a safe return to [facility] following [employer], pt adamantly refuses, states he has been to [facility] multiple times and only will D/C to [facility].  CSW spoke to [place] at [facility] at [phone] stating the patient has been offered a bed to return and has been accepted and that the pt can arrive on 7/25.  The room number will be D-4.  The number for report is [phone].  Per [facility] [employer], pt would need a new FL-2, a D/C Summary and any new medications. Would need to be faxed to: [phone]  Documents faxed EDP/RN updated and pt accepted for return by [facility]  Final next level of care: Assisted Living Barriers to Discharge: No Barriers Identified   Patient Goals and CMS Choice Patient states their goals for this hospitalization and ongoing recovery are:: (Return home" to [facility] immediately.)   Choice offered to / list presented to : Patient  Discharge Placement              Patient chooses bed at: [facility]) Patient to be transferred to facility by: [place] Name of family member notified: Kathie Dike at ph: [phone] Patient and family notified of of transfer: 06/14/19  Discharge Plan and Services                          HH Arranged: PT, OT, RN          Social Determinants of Health (SDOH) Interventions     Readmission Risk Interventions Readmission Risk Prevention Plan 06/09/2019  Transportation Screening Complete  PCP or Specialist Appt within 3-5 Days Not Complete  Not Complete comments not ready for d/c, plan  to go to SNF  [place] or [place] Complete  Social Work Consult for [place] Planning/Counseling Complete  Palliative Care Screening Not Applicable  Medication Review Press photographer) Complete  Some recent data might be hidden

## 2019-06-14 NOTE — Progress Notes (Signed)
Provider signed FL-2, provider will place [facility] CM consult for [employer] with RN, Aide, PT/OT in case pt agreeable to participate in [place] upon return to [facility].  CSW will continue to follow for D/C needs.  Alphonse Guild. Joury Allcorn, LCSW, LCAS, CSI Transitions of Care Clinical Social Worker Care Coordination Department Ph: [phone]

## 2019-06-14 NOTE — Progress Notes (Signed)
Writer called report to "Denny Peon" at [facility], who took report and stated she had the paperwork needed; but, Probation officer needed to call "[place]" in another building at [facility]. Writer phone "[place]" who said she had to go check the paperwork before the patient could be sent back. Will alert SW.

## 2019-06-19 DIAGNOSIS — D649 Anemia, unspecified: Secondary | ICD-10-CM | POA: Diagnosis not present

## 2019-06-19 DIAGNOSIS — R6889 Other general symptoms and signs: Secondary | ICD-10-CM | POA: Diagnosis not present

## 2019-06-26 DIAGNOSIS — I130 Hypertensive heart and chronic kidney disease with heart failure and stage 1 through stage 4 chronic kidney disease, or unspecified chronic kidney disease: Secondary | ICD-10-CM | POA: Diagnosis not present

## 2019-06-26 DIAGNOSIS — E1122 Type 2 diabetes mellitus with diabetic chronic kidney disease: Secondary | ICD-10-CM | POA: Diagnosis not present

## 2019-06-26 DIAGNOSIS — E876 Hypokalemia: Secondary | ICD-10-CM | POA: Diagnosis not present

## 2019-06-26 DIAGNOSIS — G4733 Obstructive sleep apnea (adult) (pediatric): Secondary | ICD-10-CM | POA: Diagnosis not present

## 2019-06-26 DIAGNOSIS — M170 Bilateral primary osteoarthritis of knee: Secondary | ICD-10-CM | POA: Diagnosis not present

## 2019-06-26 DIAGNOSIS — I872 Venous insufficiency (chronic) (peripheral): Secondary | ICD-10-CM | POA: Diagnosis not present

## 2019-06-26 DIAGNOSIS — I480 Paroxysmal atrial fibrillation: Secondary | ICD-10-CM | POA: Diagnosis not present

## 2019-06-26 DIAGNOSIS — E1142 Type 2 diabetes mellitus with diabetic polyneuropathy: Secondary | ICD-10-CM | POA: Diagnosis not present

## 2019-06-26 DIAGNOSIS — E669 Obesity, unspecified: Secondary | ICD-10-CM | POA: Diagnosis not present

## 2019-06-26 DIAGNOSIS — I5030 Unspecified diastolic (congestive) heart failure: Secondary | ICD-10-CM | POA: Diagnosis not present

## 2019-06-26 DIAGNOSIS — N400 Enlarged prostate without lower urinary tract symptoms: Secondary | ICD-10-CM | POA: Diagnosis not present

## 2019-06-26 DIAGNOSIS — E1165 Type 2 diabetes mellitus with hyperglycemia: Secondary | ICD-10-CM | POA: Diagnosis not present

## 2019-07-03 ENCOUNTER — Inpatient Hospital Stay (HOSPITAL_COMMUNITY)
Admission: EM | Admit: 2019-07-03 | Discharge: 2019-07-09 | DRG: 689 | Disposition: A | Discharge status: Skilled Nursing Facility | Payer: Medicare Other | Attending: Internal Medicine | Admitting: Internal Medicine

## 2019-07-03 ENCOUNTER — Emergency Department (HOSPITAL_COMMUNITY): Payer: Medicare Other

## 2019-07-03 ENCOUNTER — Other Ambulatory Visit: Payer: Self-pay

## 2019-07-03 DIAGNOSIS — G9340 Encephalopathy, unspecified: Secondary | ICD-10-CM | POA: Diagnosis not present

## 2019-07-03 DIAGNOSIS — Z8249 Family history of ischemic heart disease and other diseases of the circulatory system: Secondary | ICD-10-CM

## 2019-07-03 DIAGNOSIS — R079 Chest pain, unspecified: Secondary | ICD-10-CM | POA: Diagnosis not present

## 2019-07-03 DIAGNOSIS — Z20828 Contact with and (suspected) exposure to other viral communicable diseases: Secondary | ICD-10-CM | POA: Diagnosis present

## 2019-07-03 DIAGNOSIS — Z794 Long term (current) use of insulin: Secondary | ICD-10-CM | POA: Diagnosis not present

## 2019-07-03 DIAGNOSIS — I491 Atrial premature depolarization: Secondary | ICD-10-CM | POA: Diagnosis not present

## 2019-07-03 DIAGNOSIS — Z8673 Personal history of transient ischemic attack (TIA), and cerebral infarction without residual deficits: Secondary | ICD-10-CM

## 2019-07-03 DIAGNOSIS — N3000 Acute cystitis without hematuria: Secondary | ICD-10-CM

## 2019-07-03 DIAGNOSIS — G9341 Metabolic encephalopathy: Secondary | ICD-10-CM | POA: Diagnosis not present

## 2019-07-03 DIAGNOSIS — K5730 Diverticulosis of large intestine without perforation or abscess without bleeding: Secondary | ICD-10-CM | POA: Diagnosis not present

## 2019-07-03 DIAGNOSIS — I480 Paroxysmal atrial fibrillation: Secondary | ICD-10-CM | POA: Diagnosis present

## 2019-07-03 DIAGNOSIS — N182 Chronic kidney disease, stage 2 (mild): Secondary | ICD-10-CM | POA: Diagnosis not present

## 2019-07-03 DIAGNOSIS — R471 Dysarthria and anarthria: Secondary | ICD-10-CM | POA: Diagnosis not present

## 2019-07-03 DIAGNOSIS — Z7982 Long term (current) use of aspirin: Secondary | ICD-10-CM

## 2019-07-03 DIAGNOSIS — E1343 Other specified diabetes mellitus with diabetic autonomic (poly)neuropathy: Secondary | ICD-10-CM

## 2019-07-03 DIAGNOSIS — I440 Atrioventricular block, first degree: Secondary | ICD-10-CM | POA: Diagnosis present

## 2019-07-03 DIAGNOSIS — I5032 Chronic diastolic (congestive) heart failure: Secondary | ICD-10-CM | POA: Diagnosis present

## 2019-07-03 DIAGNOSIS — R0602 Shortness of breath: Secondary | ICD-10-CM | POA: Diagnosis not present

## 2019-07-03 DIAGNOSIS — B9620 Unspecified Escherichia coli [E. coli] as the cause of diseases classified elsewhere: Secondary | ICD-10-CM | POA: Diagnosis present

## 2019-07-03 DIAGNOSIS — K4000 Bilateral inguinal hernia, with obstruction, without gangrene, not specified as recurrent: Secondary | ICD-10-CM | POA: Diagnosis not present

## 2019-07-03 DIAGNOSIS — D560 Alpha thalassemia: Secondary | ICD-10-CM | POA: Diagnosis present

## 2019-07-03 DIAGNOSIS — N183 Chronic kidney disease, stage 3 (moderate): Secondary | ICD-10-CM | POA: Diagnosis present

## 2019-07-03 DIAGNOSIS — R4182 Altered mental status, unspecified: Secondary | ICD-10-CM | POA: Diagnosis not present

## 2019-07-03 DIAGNOSIS — N390 Urinary tract infection, site not specified: Principal | ICD-10-CM | POA: Diagnosis present

## 2019-07-03 DIAGNOSIS — N170 Acute kidney failure with tubular necrosis: Secondary | ICD-10-CM | POA: Diagnosis not present

## 2019-07-03 DIAGNOSIS — R0789 Other chest pain: Secondary | ICD-10-CM | POA: Diagnosis present

## 2019-07-03 DIAGNOSIS — E785 Hyperlipidemia, unspecified: Secondary | ICD-10-CM | POA: Diagnosis present

## 2019-07-03 DIAGNOSIS — G4730 Sleep apnea, unspecified: Secondary | ICD-10-CM | POA: Diagnosis present

## 2019-07-03 DIAGNOSIS — N189 Chronic kidney disease, unspecified: Secondary | ICD-10-CM | POA: Diagnosis not present

## 2019-07-03 DIAGNOSIS — E1122 Type 2 diabetes mellitus with diabetic chronic kidney disease: Secondary | ICD-10-CM | POA: Diagnosis present

## 2019-07-03 DIAGNOSIS — I495 Sick sinus syndrome: Secondary | ICD-10-CM | POA: Diagnosis not present

## 2019-07-03 DIAGNOSIS — B964 Proteus (mirabilis) (morganii) as the cause of diseases classified elsewhere: Secondary | ICD-10-CM | POA: Diagnosis present

## 2019-07-03 DIAGNOSIS — R627 Adult failure to thrive: Secondary | ICD-10-CM | POA: Diagnosis not present

## 2019-07-03 DIAGNOSIS — H9190 Unspecified hearing loss, unspecified ear: Secondary | ICD-10-CM | POA: Diagnosis present

## 2019-07-03 DIAGNOSIS — R001 Bradycardia, unspecified: Secondary | ICD-10-CM | POA: Diagnosis present

## 2019-07-03 DIAGNOSIS — D72829 Elevated white blood cell count, unspecified: Secondary | ICD-10-CM | POA: Diagnosis present

## 2019-07-03 DIAGNOSIS — E669 Obesity, unspecified: Secondary | ICD-10-CM | POA: Diagnosis present

## 2019-07-03 DIAGNOSIS — Z8546 Personal history of malignant neoplasm of prostate: Secondary | ICD-10-CM

## 2019-07-03 DIAGNOSIS — Z7902 Long term (current) use of antithrombotics/antiplatelets: Secondary | ICD-10-CM

## 2019-07-03 DIAGNOSIS — I700 Atherosclerosis of aorta: Secondary | ICD-10-CM | POA: Diagnosis not present

## 2019-07-03 DIAGNOSIS — Z7401 Bed confinement status: Secondary | ICD-10-CM | POA: Diagnosis not present

## 2019-07-03 DIAGNOSIS — R531 Weakness: Secondary | ICD-10-CM | POA: Diagnosis not present

## 2019-07-03 DIAGNOSIS — I10 Essential (primary) hypertension: Secondary | ICD-10-CM | POA: Diagnosis not present

## 2019-07-03 DIAGNOSIS — A498 Other bacterial infections of unspecified site: Secondary | ICD-10-CM | POA: Diagnosis present

## 2019-07-03 DIAGNOSIS — E876 Hypokalemia: Secondary | ICD-10-CM | POA: Diagnosis present

## 2019-07-03 DIAGNOSIS — Z79899 Other long term (current) drug therapy: Secondary | ICD-10-CM

## 2019-07-03 DIAGNOSIS — I4430 Unspecified atrioventricular block: Secondary | ICD-10-CM | POA: Diagnosis not present

## 2019-07-03 DIAGNOSIS — Z66 Do not resuscitate: Secondary | ICD-10-CM

## 2019-07-03 DIAGNOSIS — I130 Hypertensive heart and chronic kidney disease with heart failure and stage 1 through stage 4 chronic kidney disease, or unspecified chronic kidney disease: Secondary | ICD-10-CM | POA: Diagnosis present

## 2019-07-03 DIAGNOSIS — M2550 Pain in unspecified joint: Secondary | ICD-10-CM | POA: Diagnosis not present

## 2019-07-03 DIAGNOSIS — R140 Abdominal distension (gaseous): Secondary | ICD-10-CM | POA: Diagnosis not present

## 2019-07-03 DIAGNOSIS — E871 Hypo-osmolality and hyponatremia: Secondary | ICD-10-CM | POA: Diagnosis not present

## 2019-07-03 DIAGNOSIS — E873 Alkalosis: Secondary | ICD-10-CM | POA: Diagnosis present

## 2019-07-03 DIAGNOSIS — I4891 Unspecified atrial fibrillation: Secondary | ICD-10-CM | POA: Diagnosis not present

## 2019-07-03 DIAGNOSIS — Z515 Encounter for palliative care: Secondary | ICD-10-CM

## 2019-07-03 DIAGNOSIS — Z6835 Body mass index (BMI) 35.0-35.9, adult: Secondary | ICD-10-CM

## 2019-07-03 DIAGNOSIS — R159 Full incontinence of feces: Secondary | ICD-10-CM | POA: Diagnosis present

## 2019-07-03 DIAGNOSIS — N179 Acute kidney failure, unspecified: Secondary | ICD-10-CM | POA: Diagnosis not present

## 2019-07-03 DIAGNOSIS — E7800 Pure hypercholesterolemia, unspecified: Secondary | ICD-10-CM | POA: Diagnosis present

## 2019-07-03 DIAGNOSIS — E878 Other disorders of electrolyte and fluid balance, not elsewhere classified: Secondary | ICD-10-CM | POA: Diagnosis not present

## 2019-07-03 DIAGNOSIS — K5900 Constipation, unspecified: Secondary | ICD-10-CM | POA: Diagnosis present

## 2019-07-03 DIAGNOSIS — J9811 Atelectasis: Secondary | ICD-10-CM | POA: Diagnosis not present

## 2019-07-03 DIAGNOSIS — R1310 Dysphagia, unspecified: Secondary | ICD-10-CM | POA: Diagnosis present

## 2019-07-03 DIAGNOSIS — K8689 Other specified diseases of pancreas: Secondary | ICD-10-CM | POA: Diagnosis not present

## 2019-07-03 DIAGNOSIS — N400 Enlarged prostate without lower urinary tract symptoms: Secondary | ICD-10-CM | POA: Diagnosis present

## 2019-07-03 DIAGNOSIS — I639 Cerebral infarction, unspecified: Secondary | ICD-10-CM | POA: Diagnosis present

## 2019-07-03 DIAGNOSIS — I499 Cardiac arrhythmia, unspecified: Secondary | ICD-10-CM | POA: Diagnosis not present

## 2019-07-03 DIAGNOSIS — D509 Iron deficiency anemia, unspecified: Secondary | ICD-10-CM | POA: Diagnosis present

## 2019-07-03 DIAGNOSIS — Z923 Personal history of irradiation: Secondary | ICD-10-CM

## 2019-07-03 DIAGNOSIS — E119 Type 2 diabetes mellitus without complications: Secondary | ICD-10-CM

## 2019-07-03 LAB — COMPREHENSIVE METABOLIC PANEL
ALT: 17 U/L (ref 0–44)
AST: 31 U/L (ref 15–41)
Albumin: 3.9 g/dL (ref 3.5–5.0)
Alkaline Phosphatase: 79 U/L (ref 38–126)
Anion gap: 15 (ref 5–15)
BUN: 51 mg/dL — ABNORMAL HIGH (ref 8–23)
CO2: 36 mmol/L — ABNORMAL HIGH (ref 22–32)
Calcium: 10.3 mg/dL (ref 8.9–10.3)
Chloride: 83 mmol/L — ABNORMAL LOW (ref 98–111)
Creatinine, Ser: 2.8 mg/dL — ABNORMAL HIGH (ref 0.61–1.24)
GFR calc Af Amer: 23 mL/min — ABNORMAL LOW (ref >60)
GFR calc non Af Amer: 20 mL/min — ABNORMAL LOW (ref >60)
Glucose, Bld: 99 mg/dL (ref 70–99)
Potassium: 2.8 mmol/L — ABNORMAL LOW (ref 3.5–5.1)
Sodium: 134 mmol/L — ABNORMAL LOW (ref 135–145)
Total Bilirubin: 0.9 mg/dL (ref 0.3–1.2)
Total Protein: 7.7 g/dL (ref 6.5–8.1)

## 2019-07-03 LAB — CBC WITH DIFFERENTIAL/PLATELET
Abs Immature Granulocytes: 0.04 10*3/uL (ref 0.00–0.07)
Basophils Absolute: 0 10*3/uL (ref 0.0–0.1)
Basophils Relative: 0 %
Eosinophils Absolute: 0 10*3/uL (ref 0.0–0.5)
Eosinophils Relative: 0 %
HCT: 41.7 % (ref 39.0–52.0)
Hemoglobin: 13.2 g/dL (ref 13.0–17.0)
Immature Granulocytes: 0 %
Lymphocytes Relative: 12 %
Lymphs Abs: 1.3 10*3/uL (ref 0.7–4.0)
MCH: 22 pg — ABNORMAL LOW (ref 26.0–34.0)
MCHC: 31.7 g/dL (ref 30.0–36.0)
MCV: 69.5 fL — ABNORMAL LOW (ref 80.0–100.0)
Monocytes Absolute: 1.2 10*3/uL — ABNORMAL HIGH (ref 0.1–1.0)
Monocytes Relative: 11 %
Neutro Abs: 8.4 10*3/uL — ABNORMAL HIGH (ref 1.7–7.7)
Neutrophils Relative %: 77 %
Platelets: 244 10*3/uL (ref 150–400)
RBC: 6 MIL/uL — ABNORMAL HIGH (ref 4.22–5.81)
RDW: 15.4 % (ref 11.5–15.5)
WBC: 11 10*3/uL — ABNORMAL HIGH (ref 4.0–10.5)
nRBC: 0 % (ref 0.0–0.2)

## 2019-07-03 LAB — POCT I-STAT 7, (LYTES, BLD GAS, ICA,H+H)
Acid-Base Excess: 9 mmol/L — ABNORMAL HIGH (ref 0.0–2.0)
Bicarbonate: 35.5 mmol/L — ABNORMAL HIGH (ref 20.0–28.0)
Calcium, Ion: 1.24 mmol/L (ref 1.15–1.40)
HCT: 35 % — ABNORMAL LOW (ref 39.0–52.0)
Hemoglobin: 11.9 g/dL — ABNORMAL LOW (ref 13.0–17.0)
O2 Saturation: 99 %
Potassium: 2.9 mmol/L — ABNORMAL LOW (ref 3.5–5.1)
Sodium: 135 mmol/L (ref 135–145)
TCO2: 37 mmol/L — ABNORMAL HIGH (ref 22–32)
pCO2 arterial: 57.3 mmHg — ABNORMAL HIGH (ref 32.0–48.0)
pH, Arterial: 7.4 (ref 7.350–7.450)
pO2, Arterial: 139 mmHg — ABNORMAL HIGH (ref 83.0–108.0)

## 2019-07-03 LAB — CBG MONITORING, ED: Glucose-Capillary: 113 mg/dL — ABNORMAL HIGH (ref 70–99)

## 2019-07-03 LAB — AMMONIA: Ammonia: 13 umol/L (ref 9–35)

## 2019-07-03 LAB — URINALYSIS, ROUTINE W REFLEX MICROSCOPIC
Bilirubin Urine: NEGATIVE
Glucose, UA: NEGATIVE mg/dL
Ketones, ur: NEGATIVE mg/dL
Nitrite: NEGATIVE
Protein, ur: NEGATIVE mg/dL
Specific Gravity, Urine: 1.006 (ref 1.005–1.030)
WBC, UA: >50 WBC/hpf — ABNORMAL HIGH (ref 0–5)
pH: 7 (ref 5.0–8.0)

## 2019-07-03 LAB — TROPONIN I (HIGH SENSITIVITY)
Troponin I (High Sensitivity): 28 ng/L — ABNORMAL HIGH (ref <18)
Troponin I (High Sensitivity): 26 ng/L — ABNORMAL HIGH (ref <18)

## 2019-07-03 LAB — TSH: TSH: 1.269 u[IU]/mL (ref 0.350–4.500)

## 2019-07-03 LAB — LIPASE, BLOOD: Lipase: 46 U/L (ref 11–51)

## 2019-07-03 LAB — SARS CORONAVIRUS 2 BY RT PCR (HOSPITAL ORDER, PERFORMED IN ~~LOC~~ HOSPITAL LAB): SARS Coronavirus 2: NEGATIVE

## 2019-07-03 MED ORDER — SODIUM CHLORIDE 0.9 % IV SOLN
1.0000 g | INTRAVENOUS | Status: DC
  Administered 2019-07-04 – 2019-07-08 (×5): 1 g via INTRAVENOUS
  Filled 2019-07-03 (×5): qty 10

## 2019-07-03 MED ORDER — POTASSIUM CHLORIDE 10 MEQ/100ML IV SOLN
10.0000 meq | INTRAVENOUS | Status: AC
  Administered 2019-07-03 (×3): 10 meq via INTRAVENOUS
  Filled 2019-07-03 (×3): qty 100

## 2019-07-03 MED ORDER — POTASSIUM CHLORIDE 10 MEQ/100ML IV SOLN
10.0000 meq | INTRAVENOUS | Status: AC
  Administered 2019-07-03 (×2): 10 meq via INTRAVENOUS
  Filled 2019-07-03 (×2): qty 100

## 2019-07-03 MED ORDER — ONDANSETRON HCL 4 MG/2ML IJ SOLN: 4.0000 mg | Freq: Four times a day (QID) | INTRAMUSCULAR | Status: DC | PRN

## 2019-07-03 MED ORDER — SODIUM CHLORIDE 0.9 % IV SOLN
INTRAVENOUS | Status: DC | PRN
  Administered 2019-07-03: 250 mL via INTRAVENOUS

## 2019-07-03 MED ORDER — SODIUM CHLORIDE 0.9% FLUSH
3.0000 mL | Freq: Two times a day (BID) | INTRAVENOUS | Status: DC
  Administered 2019-07-03 – 2019-07-09 (×10): 3 mL via INTRAVENOUS

## 2019-07-03 MED ORDER — SODIUM CHLORIDE 0.9 % IV SOLN
INTRAVENOUS | Status: DC
  Administered 2019-07-03 – 2019-07-06 (×5): via INTRAVENOUS

## 2019-07-03 MED ORDER — POTASSIUM CHLORIDE 10 MEQ/100ML IV SOLN: 10.0000 meq | INTRAVENOUS | Status: AC

## 2019-07-03 MED ORDER — SODIUM CHLORIDE 0.9 % IV BOLUS
500.0000 mL | Freq: Once | INTRAVENOUS | Status: AC
  Administered 2019-07-03: 500 mL via INTRAVENOUS

## 2019-07-03 MED ORDER — HEPARIN SODIUM (PORCINE) 5000 UNIT/ML IJ SOLN
5000.0000 [IU] | Freq: Three times a day (TID) | INTRAMUSCULAR | Status: DC
  Administered 2019-07-03 – 2019-07-09 (×18): 5000 [IU] via SUBCUTANEOUS
  Filled 2019-07-03 (×18): qty 1

## 2019-07-03 MED ORDER — ONDANSETRON HCL 4 MG PO TABS: 4.0000 mg | ORAL_TABLET | Freq: Four times a day (QID) | ORAL | Status: DC | PRN

## 2019-07-03 MED ORDER — ACETAMINOPHEN 650 MG RE SUPP: 650.0000 mg | Freq: Four times a day (QID) | RECTAL | Status: DC | PRN

## 2019-07-03 MED ORDER — SODIUM CHLORIDE 0.9 % IV SOLN
1.0000 g | Freq: Once | INTRAVENOUS | Status: AC
  Administered 2019-07-03: 1 g via INTRAVENOUS
  Filled 2019-07-03: qty 10

## 2019-07-03 MED ORDER — ACETAMINOPHEN 325 MG PO TABS: 650.0000 mg | ORAL_TABLET | Freq: Four times a day (QID) | ORAL | Status: DC | PRN

## 2019-07-03 NOTE — ED Notes (Signed)
Pt daughter in law called for update.  Reviewed current status and pt lethargy.   Reviewed test that will be ordered and will call back with results.  [name]

## 2019-07-03 NOTE — Progress Notes (Signed)
Pt arrived unit safely from ED 

## 2019-07-03 NOTE — ED Notes (Signed)
Attempt to call report x 1.   IV team here to place second line.

## 2019-07-03 NOTE — ED Notes (Signed)
Prepared to take pt to floor and Cariology arrived to exam him.

## 2019-07-03 NOTE — ED Triage Notes (Signed)
sent by SNF with c/o CP.[name] Kitchen On arrival pt lethargic, confused to place and time.  Grip strength strong with wakening pt.   Pt denis any SOB, cough or CP.  Pt slow to respond and quiet speech garbled.  Difficult to understand.

## 2019-07-03 NOTE — ED Provider Notes (Signed)
[facility] EMERGENCY DEPARTMENT Provider Note   CSN: [id number] Arrival date & time: 07/03/19  1051     History   Chief Complaint No chief complaint on file. CP/lethargy  HPI Jared Hall is a 85 y.o. male.  Level 5 caveat secondary to altered mental status.  Patient is transferred by ambulance from his nursing facility for evaluation of chest pain.  Per EMS the patient complained to staff of chest pain although by the time EMS arrived the chest pain was gone.  Here the patient denies any chest pain.  He does say he is got a little bit of low abdominal pain.  He is very tired appearing and slow to answer.  Not oriented to time or place.     The history is provided by the nursing home, the EMS personnel and the patient.  Altered Mental Status Presenting symptoms: partial responsiveness   Severity:  Unable to specify Episode history:  Unable to specify Timing:  Unable to specify Progression:  Unchanged Context: nursing home resident   Associated symptoms: abdominal pain   Associated symptoms: no fever and no headaches     Past Medical History:  Diagnosis Date  . Alpha thalassemia ([place])   . Anemia 01/01/2017  . BPH (benign prostatic hyperplasia)   . CHF (congestive heart failure) ([place])   . High cholesterol   . Hypertension   . Lower extremity edema   . Onychomycosis   . Prostate cancer ([place])    S/P "8 weeks of radiation"  . Prostatitis    recurrent  . Sleep apnea   . Stroke [facility]) ~ 2005   denies residual on 2/123/2015  . Type II diabetes mellitus ([place])   . Umbilical hernia   . Urinary urgency    with incontinence    Patient Active Problem List   Diagnosis Date Noted  . Hypokalemia 06/02/2019  . TIA (transient ischemic attack) 05/22/2018  . Cellulitis in diabetic foot ([place]) 05/22/2018  . Tremor 10/17/2017  . Acute encephalopathy   . Acute ischemic stroke ([place])   . Generalized weakness   . Cerebrovascular disease   . Altered mental status    . Acute respiratory failure with hypercapnia ([place])   . CVA (cerebral vascular accident) ([place]) 01/01/2017  . Dehydration 01/01/2017  . Anemia 01/01/2017  . Acute respiratory failure ([place]) 12/15/2016  . Morbid obesity ([place]) 12/15/2016  . Hard of hearing 12/15/2016  . Chronic kidney disease, stage III (moderate) ([place]) 12/06/2016  . Bell's palsy   . Facial droop   . Paroxysmal atrial fibrillation (H[facility])   . Embolic stroke ([place])   . Dysphagia 03/14/2015  . Dysarthria 03/14/2015  . Stroke ([place]) 03/14/2015  . Cellulitis 01/11/2014  . Bilateral lower extremity edema 01/11/2014  . Venous stasis dermatitis 01/11/2014  . DM type 2 with diabetic peripheral neuropathy ([place]) 01/11/2014  . Cellulitis and abscess of leg 01/11/2014  . Cellulitis and abscess 01/11/2014  . Depression 06/15/2013  . GERD (gastroesophageal reflux disease) 06/15/2013  . Constipation 06/15/2013  . Diabetic neuropathy ([place]) 06/15/2013  . Type 1 diabetes mellitus with peripheral circulatory complications ([place]) 83/15/1761  . Essential hypertension, benign 04/07/2013  . AKI (acute kidney injury) ([place]) 04/04/2013  . Bilateral lower leg cellulitis 04/04/2013  . Right hip pain 04/04/2013  . Renal insufficiency 12/16/2011  . History of CVA (cerebrovascular accident) 12/16/2011  . Dyslipidemia 12/16/2011  . Obesity (BMI 30-39.9) 12/16/2011  . Umbilical hernia with obstruction-partial on CT scan; easily reducible 12/14/2011  .  History of PSVT (paroxysmal supraventricular tachycardia) 12/14/2011  . DM (diabetes mellitus) ([place]) 12/14/2011    Past Surgical History:  Procedure Laterality Date  . ADENOIDECTOMY    . ESOPHAGOGASTRODUODENOSCOPY (EGD) WITH PROPOFOL N/A 06/10/2019   Procedure: ESOPHAGOGASTRODUODENOSCOPY (EGD) WITH PROPOFOL;  Surgeon: Ronald Lobo, MD;  Location: [facility];  Service: Endoscopy;  Laterality: N/A;  . HERNIA REPAIR    . MASTOIDECTOMY    . TONSILLECTOMY    . VASECTOMY    . VENTRAL HERNIA REPAIR   12/18/2011   Procedure: HERNIA REPAIR VENTRAL ADULT;  Surgeon: Adin Hector, MD;  Location: [facility];  Service: General;  Laterality: N/A;        Home Medications    Prior to Admission medications   Medication Sig Start Date End Date Taking? Authorizing Provider  acetaminophen (TYLENOL) 500 MG tablet Take 500 mg by mouth every 6 (six) hours as needed for fever.    [provider]  amitriptyline (ELAVIL) 10 MG tablet Take 10 mg by mouth at bedtime.    [provider]  aspirin EC 81 MG tablet Take 81 mg by mouth daily.    [provider]  azelastine (OPTIVAR) 0.05 % ophthalmic solution Place 1 drop into both eyes 2 (two) times daily as needed (dry eyes).     [provider]  Benzocaine-Menthol (CEPACOL) 15-2.3 MG LOZG Use as directed 1 lozenge in the mouth or throat 4 (four) times daily as needed (cough).    [provider]  clopidogrel (PLAVIX) 75 MG tablet Take 75 mg by mouth daily.     [provider]  furosemide (LASIX) 80 MG tablet Take 80 mg by mouth 2 (two) times daily.     [provider]  insulin aspart (NOVOLOG FLEXPEN) 100 UNIT/ML FlexPen Inject 5 Units into the skin 3 (three) times daily with meals.    [provider]  insulin glargine (LANTUS) 100 UNIT/ML injection Inject 0.1 mLs (10 Units total) into the skin daily. 06/14/19   Cherylann Ratel A, DO  metolazone (ZAROXOLYN) 5 MG tablet Take 5 mg by mouth as directed. Take 1 tablet every Tues & Fri. Hold If No Leg Edema 05/20/19   [provider]  metoprolol tartrate (LOPRESSOR) 25 MG tablet Take 1 tablet (25 mg total) by mouth 2 (two) times daily. 03/17/15   Kelvin Cellar, MD  pantoprazole (PROTONIX) 40 MG tablet Take 40 mg by mouth daily.     [provider]  Polyethyl Glycol-Propyl Glycol (SYSTANE) 0.4-0.3 % GEL ophthalmic gel Place 1 application into both eyes 3 (three) times daily as needed (dry eyes).    [provider]  potassium  chloride SA (K-DUR,KLOR-CON) 20 MEQ tablet Take 20-40 mEq by mouth as directed. Take 1 tablet on (Sun, Mon, Wed, Thurs & Sat.) & Take 2 tablets on (Tues & Fri) if Metolazone is taken.    [provider]  senna-docusate (SENOKOT-S) 8.6-50 MG tablet Take 1 tablet by mouth 2 (two) times daily.    [provider]  simvastatin (ZOCOR) 20 MG tablet Take 20 mg by mouth daily.    [provider]  traMADol (ULTRAM) 50 MG tablet Take 1 tablet (50 mg total) by mouth every 6 (six) hours as needed for moderate pain. 05/24/18   Ghimire, Henreitta Leber, MD  vitamin B-12 (CYANOCOBALAMIN) 1000 MCG tablet Take 1,000 mcg by mouth daily.    [provider]  Vitamin D, Cholecalciferol, 50 MCG (2000 UT) CAPS Take 1 capsule by mouth daily.  [provider]    Family History Family History  Problem Relation Age of Onset  . Pneumonia Mother   . Heart attack Father     Social History Social History   Tobacco Use  . Smoking status: Never Smoker  . Smokeless tobacco: Never Used  Substance Use Topics  . Alcohol use: No  . Drug use: No     Allergies   Ativan [lorazepam], Flomax [tamsulosin hcl], and Glucophage [metformin hcl]   Review of Systems Review of Systems  Unable to perform ROS: Mental status change  Constitutional: Negative for fever.  Cardiovascular: Positive for chest pain (??).  Gastrointestinal: Positive for abdominal pain.  Neurological: Negative for headaches.     Physical Exam Updated Vital Signs BP (!) 138/103 (BP Location: Right Arm)   Pulse 88   Temp 98.7 F (37.1 C) (Oral)   Resp 20   SpO2 100%   Physical Exam Vitals signs and nursing note reviewed.  Constitutional:      Appearance: He is well-developed.  HENT:     Head: Normocephalic and atraumatic.  Eyes:     Conjunctiva/sclera: Conjunctivae normal.  Neck:     Musculoskeletal: Neck supple.  Cardiovascular:     Rate and Rhythm: Normal rate and regular rhythm.     Heart  sounds: No murmur.  Pulmonary:     Effort: Pulmonary effort is normal. No respiratory distress.     Breath sounds: Normal breath sounds.  Abdominal:     Palpations: Abdomen is soft.     Tenderness: There is abdominal tenderness (vague lower). There is no guarding or rebound.  Musculoskeletal: Normal range of motion.     Right lower leg: Edema present.     Left lower leg: Edema present.  Skin:    General: Skin is warm and dry.     Capillary Refill: Capillary refill takes less than 2 seconds.  Neurological:     Mental Status: He is easily aroused. He is disoriented.     Comments: Patient is moving all extremities with no focal weakness.  He is slow to answer but is understandable.  No gross facial asymmetry.  He is not oriented to time or place.  Psychiatric:        Behavior: Behavior is cooperative.      ED Treatments / Results  Labs (all labs ordered are listed, but only abnormal results are displayed) Labs Reviewed  CBC WITH DIFFERENTIAL/PLATELET - Abnormal; Notable for the following components:      Result Value   WBC 11.0 (*)    RBC 6.00 (*)    MCV 69.5 (*)    MCH 22.0 (*)    Neutro Abs 8.4 (*)    Monocytes Absolute 1.2 (*)    All other components within normal limits  COMPREHENSIVE METABOLIC PANEL - Abnormal; Notable for the following components:   Sodium 134 (*)    Potassium 2.8 (*)    Chloride 83 (*)    CO2 36 (*)    BUN 51 (*)    Creatinine, Ser 2.80 (*)    GFR calc non Af Amer 20 (*)    GFR calc Af Amer 23 (*)    All other components within normal limits  URINALYSIS, ROUTINE W REFLEX MICROSCOPIC - Abnormal; Notable for the following components:   APPearance CLOUDY (*)    Hgb urine dipstick SMALL (*)    Leukocytes,Ua LARGE (*)    WBC, UA >50 (*)    Bacteria, UA MANY (*)  All other components within normal limits  CBG MONITORING, ED - Abnormal; Notable for the following components:   Glucose-Capillary 113 (*)    All other components within normal limits   POCT I-STAT 7, (LYTES, BLD GAS, ICA,H+H) - Abnormal; Notable for the following components:   pCO2 arterial 57.3 (*)    pO2, Arterial 139.0 (*)    Bicarbonate 35.5 (*)    TCO2 37 (*)    Acid-Base Excess 9.0 (*)    Potassium 2.9 (*)    HCT 35.0 (*)    Hemoglobin 11.9 (*)    All other components within normal limits  TROPONIN I (HIGH SENSITIVITY) - Abnormal; Notable for the following components:   Troponin I (High Sensitivity) 28 (*)    All other components within normal limits  TROPONIN I (HIGH SENSITIVITY) - Abnormal; Notable for the following components:   Troponin I (High Sensitivity) 26 (*)    All other components within normal limits  SARS CORONAVIRUS 2 (HOSPITAL ORDER, [facility])  URINE CULTURE  AMMONIA  LIPASE, BLOOD  TSH  CBC  BASIC METABOLIC PANEL    EKG EKG Interpretation  Date/Time:  Thursday July 03 2019 10:57:40 EDT Ventricular Rate:  87 PR Interval:    QRS Duration: 121 QT Interval:  472 QTC Calculation: 568 R Axis:   -43 Text Interpretation:  Sinus rhythm 1st AVB Nonspecific IVCD with LAD Inferior infarct, old Anterior infarct, old Nonspecific T abnormalities, lateral leads similar to prior 7/20 Confirmed by Aletta Edouard [phone]) on 07/03/2019 11:30:36 AM   Radiology Ct Head Wo Contrast  Result Date: 07/03/2019 CLINICAL DATA:  Altered mental status. Weakness. EXAM: CT HEAD WITHOUT CONTRAST TECHNIQUE: Contiguous axial images were obtained from the base of the skull through the vertex without intravenous contrast. COMPARISON:  May 22, 2018 FINDINGS: Brain: No evidence of acute infarction, hemorrhage, hydrocephalus, extra-axial collection or mass lesion/mass effect. Moderate brain parenchymal volume loss. Advanced chronic small vessel ischemia of the deep white matter. Stable remote right parietal lobe ischemic infarct. Stable appearance of bilateral cerebellar chronic ischemic infarcts. Vascular: Calcific atherosclerotic disease of  the intra cavernous carotid arteries. Skull: Normal. Negative for fracture or focal lesion. Sinuses/Orbits: No acute finding. Other: None. IMPRESSION: 1. No acute intracranial abnormality. 2. Atrophy, chronic microvascular disease. 3. Stable right parietal lobe and bilateral cerebellar chronic ischemic infarcts. Electronically Signed   By: Fidela Salisbury M.D.   On: 07/03/2019 16:14   Dg Chest Port 1 View  Result Date: 07/03/2019 CLINICAL DATA:  Weakness and shortness of breath EXAM: PORTABLE CHEST 1 VIEW COMPARISON:  06/02/2019 and prior radiographs FINDINGS: Cardiomegaly noted. LEFT LOWER lung atelectasis and possible small LEFT pleural effusion again noted. No evidence of pneumothorax. No acute bony abnormality identified. IMPRESSION: Unchanged appearance of the chest with LEFT LOWER lung atelectasis and possible small LEFT pleural effusion. Electronically Signed   By: Margarette Canada M.D.   On: 07/03/2019 11:54    Procedures Procedures (including critical care time)  Medications Ordered in ED Medications  potassium chloride 10 mEq in 100 mL IVPB (0 mEq Intravenous Stopped 07/03/19 1600)  cefTRIAXone (ROCEPHIN) 1 g in sodium chloride 0.9 % 100 mL IVPB (has no administration in time range)  heparin injection 5,000 Units (has no administration in time range)  sodium chloride flush (NS) 0.9 % injection 3 mL (has no administration in time range)  ondansetron (ZOFRAN) tablet 4 mg (has no administration in time range)    Or  ondansetron (ZOFRAN) injection 4  mg (has no administration in time range)  acetaminophen (TYLENOL) tablet 650 mg (has no administration in time range)    Or  acetaminophen (TYLENOL) suppository 650 mg (has no administration in time range)  0.9 %  sodium chloride infusion (has no administration in time range)  cefTRIAXone (ROCEPHIN) 1 g in sodium chloride 0.9 % 100 mL IVPB (has no administration in time range)  sodium chloride 0.9 % bolus 500 mL (0 mLs Intravenous Stopped  07/03/19 1245)     Initial Impression / Assessment and Plan / ED Course  I have reviewed the triage vital signs and the nursing notes.  Pertinent labs & imaging results that were available during my care of the patient were reviewed by me and considered in my medical decision making (see chart for details).  Clinical Course as of Jul 02 1714  Thu Jul 02, 6313  3051 85 year old male from nursing home poor historian here for possibly having chest pain although patient does not recall.  Here the patient endorses some abdominal pain although was very soft on exam.  He is rather slow to respond although has a nonfocal exam.  Differential diagnosis includes encephalopathy, infection, ACS, UTI,   [MB]  1320 Since labs started to come back and he has a critically low potassium of 2.8.  Have ordered some IV repletion of this.  His creatinine is also elevated 2.8 which is up from his baseline of 1.4.  Troponin elevated at 28.  This will need to be repeated.  White count slightly elevated 11 hemoglobin okay ammonia normal   [MB]  1320 Chest x-ray reviewed shows some atelectasis in the base no gross infiltrate.   [MB]  9518 Back to reevaluate patient.  He opens eyes to voice but is still minimally communicative.  Still awaiting head CT.  Delta troponin does not show any significant change.  His potassium is infusing.   [MB]  1430 Patient's urine came back with greater than 50 whites.  Have ordered a urine culture and IV ceftriaxone.  Still waiting on his head CT but I paged the hospitalist to discuss admission.   [MB]  8416 Discussed with Dr. Tamala Julian from the hospitalist service who will evaluate the patient for admission.  I explained that the patient's head CT is still pending and will need to be followed up on.    [MB]    Clinical Course User Index [MB] Hayden Rasmussen, MD   [name] was evaluated in Emergency Department on 07/03/2019 for the symptoms described in the history of present illness.  He was evaluated in the context of the global COVID-19 pandemic, which necessitated consideration that the patient might be at risk for infection with the SARS-CoV-2 virus that causes COVID-19. Institutional protocols and algorithms that pertain to the evaluation of patients at risk for COVID-19 are in a state of rapid change based on information released by regulatory bodies including the CDC and federal and state organizations. These policies and algorithms were followed during the patient's care in the ED.      Final Clinical Impressions(s) / ED Diagnoses   Final diagnoses:  Acute encephalopathy  Hypokalemia  AKI (acute kidney injury) [facility])    ED Discharge Orders    None       Hayden Rasmussen, MD 07/03/19 [id number]

## 2019-07-03 NOTE — ED Notes (Signed)
Pt increased restlessness and agitation.   Attempt at additionalIV start unsuccessful with considerable need to hold him still.  He denies any pain to R arm IV site with infusion.  Cannot verbalize what is wrong.

## 2019-07-03 NOTE — ED Notes (Signed)
Pt has variable HR at this time 50's to low 40's.   Remains restless.   NS piggybacked in through IV line as wel.[name] Kitchen

## 2019-07-03 NOTE — ED Notes (Signed)
ED TO INPATIENT HANDOFF REPORT  ED Nurse Name and Phone #: Celene Squibb RN  S Name/Age/Gender Jared Hall 85 y.o. male Room/Bed: 034C/034C  Code Status   Code Status: DNR  Home/SNF/Other Nursing Home Patient oriented to: self Is this baseline? No   Triage Complete: Triage complete  Chief Complaint sick  Triage Note sent by SNF with c/o CP.[name] Kitchen On arrival pt lethargic, confused to place and time.  Grip strength strong with wakening pt.   Pt denis any SOB, cough or CP.  Pt slow to respond and quiet speech garbled.  Difficult to understand.    Allergies Allergies  Allergen Reactions  . Ativan [Lorazepam] Other (See Comments)    Flailing of extremities noted immediately s/p IV administration. "Allergic," per MAR  . Flomax [Tamsulosin Hcl] Other (See Comments)    Excessive tearing; "Allergic," per MAR  . Glucophage [Metformin Hcl] Diarrhea and Other (See Comments)    "Allergic," per MAR    Level of Care/Admitting Diagnosis ED Disposition    ED Disposition Condition [place]: [place] [[id number]]  Level of Care: Progressive [102]  Covid Evaluation: Confirmed COVID Negative  Diagnosis: Acute encephalopathy [[id number]]  Admitting Physician: Norval [place] [[id number]]  Attending Physician: Norval [place] [[id number]]  Estimated length of stay: past midnight tomorrow  Certification:: I certify this patient will need inpatient services for at least 2 midnights  PT Class (Do Not Modify): Inpatient [101]  PT Acc Code (Do Not Modify): Private [1]       B Medical/Surgery History Past Medical History:  Diagnosis Date  . Alpha thalassemia ([place])   . Anemia 01/01/2017  . BPH (benign prostatic hyperplasia)   . CHF (congestive heart failure) ([place])   . High cholesterol   . Hypertension   . Lower extremity edema   . Onychomycosis   . Prostate cancer ([place])    S/P "8 weeks of radiation"  . Prostatitis    recurrent  . Sleep apnea   .  Stroke [facility]) ~ 2005   denies residual on 2/123/2015  . Type II diabetes mellitus ([place])   . Umbilical hernia   . Urinary urgency    with incontinence   Past Surgical History:  Procedure Laterality Date  . ADENOIDECTOMY    . ESOPHAGOGASTRODUODENOSCOPY (EGD) WITH PROPOFOL N/A 06/10/2019   Procedure: ESOPHAGOGASTRODUODENOSCOPY (EGD) WITH PROPOFOL;  Surgeon: Ronald Lobo, MD;  Location: [facility];  Service: Endoscopy;  Laterality: N/A;  . HERNIA REPAIR    . MASTOIDECTOMY    . TONSILLECTOMY    . VASECTOMY    . VENTRAL HERNIA REPAIR  12/18/2011   Procedure: HERNIA REPAIR VENTRAL ADULT;  Surgeon: Adin Hector, MD;  Location: [facility];  Service: General;  Laterality: N/A;     A IV Location/Drains/Wounds Patient Lines/Drains/Airways Status   Active Line/Drains/Airways    Name:   Placement date:   Placement time:   Site:   Days:   Peripheral IV 07/03/19 Right Hand   07/03/19    1020    Hand   less than 1   Wound / Incision (Open or Dehisced) 05/22/18 Buttocks old pressure sore, scab on it   05/22/18    1854    Buttocks   407          Intake/Output Last 24 hours  Intake/Output Summary (Last 24 hours) at 07/03/2019 1627 Last data filed at 07/03/2019 1443 Gross per 24 hour  Intake 600 ml  Output -  Net 600 ml    Labs/Imaging Results for orders placed or performed during the hospital encounter of 07/03/19 (from the past 48 hour(s))  CBC with Differential     Status: Abnormal   Collection Time: 07/03/19 11:21 AM  Result Value Ref Range   WBC 11.0 (H) 4.0 - 10.5 K/uL   RBC 6.00 (H) 4.22 - 5.81 MIL/uL   Hemoglobin 13.2 13.0 - 17.0 g/dL   HCT 41.7 39.0 - 52.0 %   MCV 69.5 (L) 80.0 - 100.0 fL   MCH 22.0 (L) 26.0 - 34.0 pg   MCHC 31.7 30.0 - 36.0 g/dL   RDW 15.4 11.5 - 15.5 %   Platelets 244 150 - 400 K/uL   nRBC 0.0 0.0 - 0.2 %   Neutrophils Relative % 77 %   Neutro Abs 8.4 (H) 1.7 - 7.7 K/uL   Lymphocytes Relative 12 %   Lymphs Abs 1.3 0.7 - 4.0 K/uL   Monocytes Relative  11 %   Monocytes Absolute 1.2 (H) 0.1 - 1.0 K/uL   Eosinophils Relative 0 %   Eosinophils Absolute 0.0 0.0 - 0.5 K/uL   Basophils Relative 0 %   Basophils Absolute 0.0 0.0 - 0.1 K/uL   Smear Review PLATELET CLUMPS PRESENT    Immature Granulocytes 0 %   Abs Immature Granulocytes 0.04 0.00 - 0.07 K/uL    Comment: Performed at [facility], [address] [place], [place] [address]  Comprehensive metabolic panel     Status: Abnormal   Collection Time: 07/03/19 11:21 AM  Result Value Ref Range   Sodium 134 (L) 135 - 145 mmol/L   Potassium 2.8 (L) 3.5 - 5.1 mmol/L   Chloride 83 (L) 98 - 111 mmol/L   CO2 36 (H) 22 - 32 mmol/L   Glucose, Bld 99 70 - 99 mg/dL   BUN 51 (H) 8 - 23 mg/dL   Creatinine, Ser 2.80 (H) 0.61 - 1.24 mg/dL   Calcium 10.3 8.9 - 10.3 mg/dL   Total Protein 7.7 6.5 - 8.1 g/dL   Albumin 3.9 3.5 - 5.0 g/dL   AST 31 15 - 41 U/L   ALT 17 0 - 44 U/L   Alkaline Phosphatase 79 38 - 126 U/L   Total Bilirubin 0.9 0.3 - 1.2 mg/dL   GFR calc non Af Amer 20 (L) >60 mL/min   GFR calc Af Amer 23 (L) >60 mL/min   Anion gap 15 5 - 15    Comment: Performed at [facility], [place] [address] [place], [place] [address]  Urinalysis, Routine w reflex microscopic     Status: Abnormal   Collection Time: 07/03/19 11:21 AM  Result Value Ref Range   Color, Urine YELLOW YELLOW   APPearance CLOUDY (A) CLEAR   Specific Gravity, Urine 1.006 1.005 - 1.030   pH 7.0 5.0 - 8.0   Glucose, UA NEGATIVE NEGATIVE mg/dL   Hgb urine dipstick SMALL (A) NEGATIVE   Bilirubin Urine NEGATIVE NEGATIVE   Ketones, ur NEGATIVE NEGATIVE mg/dL   Protein, ur NEGATIVE NEGATIVE mg/dL   Nitrite NEGATIVE NEGATIVE   Leukocytes,Ua LARGE (A) NEGATIVE   RBC / HPF 6-10 0 - 5 RBC/hpf   WBC, UA >50 (H) 0 - 5 WBC/hpf   Bacteria, UA MANY (A) NONE SEEN   Squamous Epithelial / LPF 0-5 0 - 5   WBC Clumps PRESENT     Comment: Performed at [facility], [place] [address], [place], [place] [address]  Lipase,  blood     Status: None   Collection Time: 07/03/19 11:21 AM  Result Value Ref Range   Lipase 46 11 - 51 U/L    Comment: Performed at [facility] [place] [address], [place], [place] [address]  Troponin I (High Sensitivity)     Status: Abnormal   Collection Time: 07/03/19 11:21 AM  Result Value Ref Range   Troponin I (High Sensitivity) 28 (H) <18 ng/L    Comment: (NOTE) Elevated high sensitivity troponin I (hsTnI) values and significant  changes across serial measurements may suggest ACS but many other  chronic and acute conditions are known to elevate hsTnI results.  Refer to the "Links" section for chest pain algorithms and additional  guidance. Performed at [facility], [place] [address] [place], [place] [address]   SARS Coronavirus 2 [facility] order, Performed in [facility] hospital lab) Nasopharyngeal Nasopharyngeal Swab     Status: None   Collection Time: 07/03/19 11:22 AM   Specimen: Nasopharyngeal Swab  Result Value Ref Range   SARS Coronavirus 2 NEGATIVE NEGATIVE    Comment: (NOTE) If result is NEGATIVE SARS-CoV-2 target nucleic acids are NOT DETECTED. The SARS-CoV-2 RNA is generally detectable in upper and lower  respiratory specimens during the acute phase of infection. The lowest  concentration of SARS-CoV-2 viral copies this assay can detect is 250  copies / mL. A negative result does not preclude SARS-CoV-2 infection  and should not be used as the sole basis for treatment or other  patient management decisions.  A negative result may occur with  improper specimen collection / handling, submission of specimen other  than nasopharyngeal swab, presence of viral mutation(s) within the  areas targeted by this assay, and inadequate number of viral copies  (<250 copies / mL). A negative result must be combined with clinical  observations, patient history, and epidemiological information. If result is POSITIVE SARS-CoV-2 target nucleic acids are DETECTED. The  SARS-CoV-2 RNA is generally detectable in upper and lower  respiratory specimens dur ing the acute phase of infection.  Positive  results are indicative of active infection with SARS-CoV-2.  Clinical  correlation with patient history and other diagnostic information is  necessary to determine patient infection status.  Positive results do  not rule out bacterial infection or co-infection with other viruses. If result is PRESUMPTIVE POSTIVE SARS-CoV-2 nucleic acids MAY BE PRESENT.   A presumptive positive result was obtained on the submitted specimen  and confirmed on repeat testing.  While 2019 novel coronavirus  (SARS-CoV-2) nucleic acids may be present in the submitted sample  additional confirmatory testing may be necessary for epidemiological  and / or clinical management purposes  to differentiate between  SARS-CoV-2 and other Sarbecovirus currently known to infect humans.  If clinically indicated additional testing with an alternate test  methodology [phone]) is advised. The SARS-CoV-2 RNA is generally  detectable in upper and lower respiratory sp ecimens during the acute  phase of infection. The expected result is Negative. Fact Sheet for Patients:  StrictlyIdeas.no Fact Sheet for Healthcare Providers: BankingDealers.co.za This test is not yet approved or cleared by the [place] FDA and has been authorized for detection and/or diagnosis of SARS-CoV-2 by FDA under an Emergency Use Authorization (EUA).  This EUA will remain in effect (meaning this test can be used) for the duration of the COVID-19 declaration under Section 564(b)(1) of the Act, 21 U.S.C. section 360bbb-3(b)(1), unless the authorization is terminated or revoked sooner. Performed  at [facility], [place] [address], [place], [place] [address]   CBG monitoring, ED     Status: Abnormal   Collection Time: 07/03/19 11:30 AM  Result Value Ref Range    Glucose-Capillary 113 (H) 70 - 99 mg/dL  Ammonia     Status: None   Collection Time: 07/03/19 11:56 AM  Result Value Ref Range   Ammonia 13 9 - 35 umol/L    Comment: Performed at [facility], [place] [address], [place], [place] [address]  Troponin I (High Sensitivity)     Status: Abnormal   Collection Time: 07/03/19 12:52 PM  Result Value Ref Range   Troponin I (High Sensitivity) 26 (H) <18 ng/L    Comment: (NOTE) Elevated high sensitivity troponin I (hsTnI) values and significant  changes across serial measurements may suggest ACS but many other  chronic and acute conditions are known to elevate hsTnI results.  Refer to the "Links" section for chest pain algorithms and additional  guidance. Performed at [facility], [place] [address] [place], [place] [address]   I-STAT 7, (LYTES, BLD GAS, ICA, H+H)     Status: Abnormal   Collection Time: 07/03/19  4:13 PM  Result Value Ref Range   pH, Arterial 7.400 7.350 - 7.450   pCO2 arterial 57.3 (H) 32.0 - 48.0 mmHg   pO2, Arterial 139.0 (H) 83.0 - 108.0 mmHg   Bicarbonate 35.5 (H) 20.0 - 28.0 mmol/L   TCO2 37 (H) 22 - 32 mmol/L   O2 Saturation 99.0 %   Acid-Base Excess 9.0 (H) 0.0 - 2.0 mmol/L   Sodium 135 135 - 145 mmol/L   Potassium 2.9 (L) 3.5 - 5.1 mmol/L   Calcium, Ion 1.24 1.15 - 1.40 mmol/L   HCT 35.0 (L) 39.0 - 52.0 %   Hemoglobin 11.9 (L) 13.0 - 17.0 g/dL   Patient temperature HIDE    Collection site FEMORAL ARTERY    Sample type ARTERIAL    Ct Head Wo Contrast  Result Date: 07/03/2019 CLINICAL DATA:  Altered mental status. Weakness. EXAM: CT HEAD WITHOUT CONTRAST TECHNIQUE: Contiguous axial images were obtained from the base of the skull through the vertex without intravenous contrast. COMPARISON:  May 22, 2018 FINDINGS: Brain: No evidence of acute infarction, hemorrhage, hydrocephalus, extra-axial collection or mass lesion/mass effect. Moderate brain parenchymal volume loss. Advanced chronic small vessel ischemia of  the deep white matter. Stable remote right parietal lobe ischemic infarct. Stable appearance of bilateral cerebellar chronic ischemic infarcts. Vascular: Calcific atherosclerotic disease of the intra cavernous carotid arteries. Skull: Normal. Negative for fracture or focal lesion. Sinuses/Orbits: No acute finding. Other: None. IMPRESSION: 1. No acute intracranial abnormality. 2. Atrophy, chronic microvascular disease. 3. Stable right parietal lobe and bilateral cerebellar chronic ischemic infarcts. Electronically Signed   By: Fidela Salisbury M.D.   On: 07/03/2019 16:14   Dg Chest Port 1 View  Result Date: 07/03/2019 CLINICAL DATA:  Weakness and shortness of breath EXAM: PORTABLE CHEST 1 VIEW COMPARISON:  06/02/2019 and prior radiographs FINDINGS: Cardiomegaly noted. LEFT LOWER lung atelectasis and possible small LEFT pleural effusion again noted. No evidence of pneumothorax. No acute bony abnormality identified. IMPRESSION: Unchanged appearance of the chest with LEFT LOWER lung atelectasis and possible small LEFT pleural effusion. Electronically Signed   By: Margarette Canada M.D.   On: 07/03/2019 11:54    Pending Labs Unresulted Labs (From admission, onward)    Start     Ordered   07/04/19 0500  CBC  Tomorrow morning,   R     07/03/19 1548   07/04/19 7616  Basic metabolic panel  Tomorrow morning,   R     07/03/19 1548   07/03/19 1551  TSH  Add-on,   AD     07/03/19 1550   07/03/19 1547  Blood gas, arterial  ONCE - STAT,   R     07/03/19 1548   07/03/19 1431  Urine culture  ONCE - STAT,   STAT     07/03/19 1430          Vitals/Pain Today's Vitals   07/03/19 1330 07/03/19 1345 07/03/19 1400 07/03/19 1445  BP: 117/62 (!) 110/93 (!) 115/104 (!) 114/45  Pulse: 80   76  Resp: 18 (!) 23 (!) 21 (!) 25  Temp:      TempSrc:      SpO2: 100%   100%    Isolation Precautions No active isolations  Medications Medications  potassium chloride 10 mEq in 100 mL IVPB (10 mEq Intravenous New  Bag/Given 07/03/19 1455)  cefTRIAXone (ROCEPHIN) 1 g in sodium chloride 0.9 % 100 mL IVPB (has no administration in time range)  heparin injection 5,000 Units (has no administration in time range)  sodium chloride flush (NS) 0.9 % injection 3 mL (has no administration in time range)  ondansetron (ZOFRAN) tablet 4 mg (has no administration in time range)    Or  ondansetron (ZOFRAN) injection 4 mg (has no administration in time range)  acetaminophen (TYLENOL) tablet 650 mg (has no administration in time range)    Or  acetaminophen (TYLENOL) suppository 650 mg (has no administration in time range)  0.9 %  sodium chloride infusion (has no administration in time range)  cefTRIAXone (ROCEPHIN) 1 g in sodium chloride 0.9 % 100 mL IVPB (has no administration in time range)  sodium chloride 0.9 % bolus 500 mL (0 mLs Intravenous Stopped 07/03/19 1245)    Mobility walks with device High fall risk   Focused Assessments Cardiac Assessment Handoff:    Lab Results  Component Value Date   CKTOTAL 141 12/09/2010   CKMB 1.4 12/09/2010   TROPONINI <0.03 05/23/2018   Lab Results  Component Value Date   DDIMER <0.27 03/16/2015   Does the Patient currently have chest pain? No     R Recommendations: See Admitting Provider Note  Report given to:   Additional Notes:

## 2019-07-03 NOTE — Consult Note (Addendum)
Cardiology Consultation:   Patient ID: JALEN DALUZ MRN: [id number]; DOB: 1930/12/22  Admit date: 07/03/2019 Date of Consult: 07/03/2019  Primary Care Provider: Josetta Huddle, MD Primary Cardiologist: Previously seen by Dr. Radford Pax in 2008. Primary Electrophysiologist:  None    Patient Profile:   Jared Hall is a 85 y.o. male with a history of normal coronaries on cardiac catheterization in 4696, chronic diastolic CHF, hypertension, hyperlipidemia, type 2 diabetes mellitus, prior stroke on Aspirin and Plavix, alpha thalassemia who is being seen today for the evaluation of bradycardia at the request of Dr. Tamala Julian.  History of Present Illness:   Mr. Candelas is a 85 year old male with the above history. History obtained entirely through chart review as patient currently has altered mental status and is not able to answer any question. Per chart review, it looks like patient seen by Dr. Radford Pax in 2008 but I cannot actually review any of the notes from that time. From what I can tell from our system and Care Everywhere, it does not look like he has been seen by Cardiology since then. However, patient on Lasix 80mg  twice daily and Metolazone 5mg  on Tuesdays and Fridays at home. Last Echo from 11/2016 showed LVEF of 55-60% with grade 1 diastolic dysfunction. Patient has paroxysmal atrial fibrillation listed in chart but do not see any EKGs consistent with this (EKGs have a lot of baseline artifact but P wave can be seen). Patient was recently admitted from 06/02/2019 to 06/14/2019 for hypokalemia and AKI after presenting with abdominal pain. Felt to likely be secondary to diuretics. Also diagnosed with gastric outlet obstruction. Patient underwent EGD which showed no evidence of mass or lesion to explain obstruction. He was discharged back to SNF at [facility].  Patient presented to the ED today from SNF via EMS for evaluation of chest pain.  However, by the time EMS arrived chest pain had reportedly  resolved. EMS noted that patient was very tired and tired and slow to answer.  He is not oriented to time or place. Upon arrival to the ED, patient lethargic and confused but vitals stable.  He denied any shortness of breath, cough, or chest pain at that time. He did report some lower abdominal pain to ED physician though. EKG showed normal sinus rhythm with Q waves in anterior and inferior leads and 1st degree AV block but no acute ischemic changes. High-sensitivity troponin minimally elevated and flat at 28 >> 26. Chest x-ray showed unchanged appearance of the chest with left lower lung atelectasis and possible small left pleural effusion. Head CT showed no acute intracranial abnormalities. WBC 11.0, Hgb 13.2, Plts 244. Na 134, K 2.8, Glucose 99, SCr 2.80. Lipase normal. Urinalysis showed cloudy appearance, large leukocytes, >50 bacteria, and many bacteria. Urine culture ordered. COVID-19 negative.  Cardiology consulted for bradycardia with rates as low as the 30's at times on telemetry in the ED. At the time of this evaluation, patient still very somnolent and not alert enough to answer any questions.  Heart Pathway Score:     Past Medical History:  Diagnosis Date  . Alpha thalassemia ([place])   . Anemia 01/01/2017  . BPH (benign prostatic hyperplasia)   . CHF (congestive heart failure) ([place])   . High cholesterol   . Hypertension   . Lower extremity edema   . Onychomycosis   . Prostate cancer ([place])    S/P "8 weeks of radiation"  . Prostatitis    recurrent  . Sleep apnea   .  Stroke [facility]) ~ 2005   denies residual on 2/123/2015  . Type II diabetes mellitus ([place])   . Umbilical hernia   . Urinary urgency    with incontinence    Past Surgical History:  Procedure Laterality Date  . ADENOIDECTOMY    . ESOPHAGOGASTRODUODENOSCOPY (EGD) WITH PROPOFOL N/A 06/10/2019   Procedure: ESOPHAGOGASTRODUODENOSCOPY (EGD) WITH PROPOFOL;  Surgeon: Ronald Lobo, MD;  Location: [facility];  Service:  Endoscopy;  Laterality: N/A;  . HERNIA REPAIR    . MASTOIDECTOMY    . TONSILLECTOMY    . VASECTOMY    . VENTRAL HERNIA REPAIR  12/18/2011   Procedure: HERNIA REPAIR VENTRAL ADULT;  Surgeon: Adin Hector, MD;  Location: [facility];  Service: General;  Laterality: N/A;     Home Medications:  Prior to Admission medications   Medication Sig Start Date End Date Taking? Authorizing Provider  amitriptyline (ELAVIL) 10 MG tablet Take 10 mg by mouth at bedtime.   Yes [provider]  aspirin EC 81 MG tablet Take 81 mg by mouth daily.   Yes [provider]  azelastine (OPTIVAR) 0.05 % ophthalmic solution Place 1 drop into both eyes 2 (two) times daily as needed (dryness or itching).    Yes [provider]  Benzocaine-Menthol (CEPACOL) 15-2.3 MG LOZG Use as directed 1 lozenge in the mouth or throat 4 (four) times daily as needed (for soreness).    Yes [provider]  Cholecalciferol (VITAMIN D3) 50 MCG (2000 UT) capsule Take 2,000 Units by mouth daily.   Yes [provider]  clopidogrel (PLAVIX) 75 MG tablet Take 75 mg by mouth daily.    Yes [provider]  furosemide (LASIX) 80 MG tablet Take 80 mg by mouth 2 (two) times daily.    Yes [provider]  insulin aspart (NOVOLOG FLEXPEN) 100 UNIT/ML FlexPen Inject 5 Units into the skin See admin instructions. Inject 5 units into the skin three times a day with meals and "check sugar two times a day and use sliding scale coverage as follows: BGL 150 = give nothing; 151-200 = 7 units; 201-250 = 9 units; 251-300 = 11 units; 301-350 = 13 units; 351-400 = 15 units"   Yes [provider]  insulin glargine (LANTUS) 100 UNIT/ML injection Inject 0.1 mLs (10 Units total) into the skin daily. 06/14/19  Yes Kyle, Tyrone A, DO  metolazone (ZAROXOLYN) 5 MG tablet Take 5 mg by mouth See admin instructions. Take 5 mg by mouth once a day on Tues/Fri (hold if no leg edema) 05/20/19  Yes [provider]  metoprolol tartrate (LOPRESSOR) 25 MG tablet Take 1 tablet (25 mg total) by mouth 2 (two) times daily. 03/17/15  Yes Kelvin Cellar, MD  pantoprazole (PROTONIX) 40 MG tablet Take 40 mg by mouth daily.    Yes [provider]  Polyethyl Glycol-Propyl Glycol (SYSTANE) 0.4-0.3 % GEL ophthalmic gel Place 1 application into the right eye See admin instructions. Pull down the lower right eyelid and apply a small amount three times a day as needed for dryness   Yes [provider]  potassium chloride SA (K-DUR,KLOR-CON) 20 MEQ tablet Take 20-40 mEq by mouth See admin instructions. Take 20 mEq by mouth on Sun/Mon/Wed/Thurs/Sat and 40 mEq on Tues/Fri (IF Metolazone is taken)   Yes [provider]  senna-docusate (SENOKOT-S) 8.6-50 MG tablet Take 1 tablet by mouth 2 (two) times daily.   Yes [provider]  simvastatin (ZOCOR) 20 MG tablet Take 20  mg by mouth daily at 8 pm.    Yes [provider]  traMADol (ULTRAM) 50 MG tablet Take 1 tablet (50 mg total) by mouth every 6 (six) hours as needed for moderate pain. Patient taking differently: Take 50 mg by mouth every 6 (six) hours as needed (for pain).  05/24/18  Yes Ghimire, Henreitta Leber, MD  vitamin B-12 (CYANOCOBALAMIN) 500 MCG tablet Take 500 mcg by mouth daily.   Yes [provider]  acetaminophen (TYLENOL) 500 MG tablet Take 500 mg by mouth every 6 (six) hours as needed for fever.    [provider]    Inpatient Medications: Scheduled Meds: . heparin  5,000 Units Subcutaneous Q8H  . sodium chloride flush  3 mL Intravenous Q12H   Continuous Infusions: . sodium chloride    . cefTRIAXone (ROCEPHIN)  IV    . potassium chloride 10 mEq (07/03/19 1455)   PRN Meds: acetaminophen **OR** acetaminophen, ondansetron **OR** ondansetron (ZOFRAN) IV  Allergies:    Allergies  Allergen Reactions  . Ativan [Lorazepam] Other (See Comments)    Flailing of extremities noted immediately s/p IV  administration. "Allergic," per MAR  . Flomax [Tamsulosin Hcl] Other (See Comments)    Excessive tearing; "Allergic," per MAR  . Glucophage [Metformin Hcl] Diarrhea and Other (See Comments)    "Allergic," per [facility]    Social History:   Social History   Socioeconomic History  . Marital status: Widowed    Spouse name: Not on file  . Number of children: Not on file  . Years of education: Not on file  . Highest education level: Not on file  Occupational History  . Not on file  Social Needs  . Financial resource strain: Not on file  . Food insecurity    Worry: Not on file    Inability: Not on file  . Transportation needs    Medical: Not on file    Non-medical: Not on file  Tobacco Use  . Smoking status: Never Smoker  . Smokeless tobacco: Never Used  Substance and Sexual Activity  . Alcohol use: No  . Drug use: No  . Sexual activity: Never  Lifestyle  . Physical activity    Days per week: Not on file    Minutes per session: Not on file  . Stress: Not on file  Relationships  . Social Herbalist on phone: Not on file    Gets together: Not on file    Attends religious service: Not on file    Active member of club or organization: Not on file    Attends meetings of clubs or organizations: Not on file    Relationship status: Not on file  . Intimate partner violence    Fear of current or ex partner: Not on file    Emotionally abused: Not on file    Physically abused: Not on file    Forced sexual activity: Not on file  Other Topics Concern  . Not on file  Social History Narrative   Tobacco Use Cigarettes: Never Smoked   No Alcohol   Caffeine: Yes   No recreational drug use   Marital Status: Widowed           Family History:   Unable to obtain any additional family history due to patient's altered mental status. Family History  Problem Relation Age of Onset  . Pneumonia Mother   . Heart attack Father      ROS:  Please see the history  of present  illness.  Review of Systems  Unable to perform ROS: Mental status change   Physical Exam/Data:   Vitals:   07/03/19 1330 07/03/19 1345 07/03/19 1400 07/03/19 1445  BP: 117/62 (!) 110/93 (!) 115/104 (!) 114/45  Pulse: 80   76  Resp: 18 (!) 23 (!) 21 (!) 25  Temp:      TempSrc:      SpO2: 100%   100%    Intake/Output Summary (Last 24 hours) at 07/03/2019 1556 Last data filed at 07/03/2019 1443 Gross per 24 hour  Intake 600 ml  Output -  Net 600 ml   Last 3 Weights 06/14/2019 06/13/2019 06/12/2019  Weight (lbs) 242 lb 8.1 oz 245 lb 6 oz 244 lb 7.8 oz  Weight (kg) 110 kg 111.3 kg 110.9 kg     There is no height or weight on file to calculate BMI.  General: 85 y.o. Caucasian male who is very somnolent and disoriented but in no acute distress. HEENT: Normocephalic and atraumatic.  Neck: Supple. No carotid bruits.  Heart: RRR. Distinct S1 and S2. No significant murmurs, gallops, or rubs appreciated. Lungs: No increased work of breathing. Mild crackles noted in left base. No wheezes or rhonchi.  Abdomen: Soft and obese with some tenderness to palpation of lower abdomen. Bowel sounds present in all 4 quadrants.  Extremities: Mild bilateral lower extremity edema, mostly around ankles. Erythema in bilateral posterior lower extremities, not significantly warm to the touch. Skin: Skin somewhat cool to the touch but dry. Neuro: Very somnolent but able to be aroused. Unable to answer orientation questions at this time. No focal deficits. Psych: Very somnolent. Slightly agitated moving around in hospital bed a lot.   EKG:  The EKG was personally reviewed and demonstrates:  Normal sinus rh ythm, rate 87 bpm, with Q waves in inferior and anterior leads, first degree AV block, and left axis deviation but no acute ST/T changes.   Telemetry:  Telemetry was personally reviewed and demonstrates:  Sinus rhythm with rates ranging from the 30's to 90's with PVCs and 1st degree AV block. No high grade AV  block noted.  Relevant CV Studies:  Echocardiogram 12/06/2016: Study Conclusions: - Left ventricle: The cavity size was normal. Wall thickness was   increased in a pattern of moderate LVH. Systolic function was   normal. The estimated ejection fraction was in the range of 55%   to 60%. Although no diagnostic regional wall motion abnormality   was identified, this possibility cannot be completely excluded on   the basis of this study. Doppler parameters are consistent with   abnormal left ventricular relaxation (grade 1 diastolic   dysfunction). - Aortic valve: Trileaflet; moderately calcified leaflets.   Sclerosis without stenosis. - Mitral valve: There was no significant regurgitation. - Right ventricle: The cavity size was normal. Systolic function   was normal. - Pulmonary arteries: No complete TR doppler jet so unable to   estimate PA systolic pressure. - Systemic veins: IVC measured 2.2 cm with > 50% respirophasic   variation, suggesting RA pressure 8 mmHg.  Impressions: - Normal LV size with moderate LV hypertrophy. EF 55-60%. Normal RV   size and systolic function. Aortic sclerosis without significant   stenosis.  Laboratory Data:  High Sensitivity Troponin:   Recent Labs  Lab 07/03/19 1121 07/03/19 1252  TROPONINIHS 28* 26*     Cardiac EnzymesNo results for input(s): TROPONINI in the last 168 hours. No results for input(s): TROPIPOC in the last  168 hours.  Chemistry Recent Labs  Lab 07/03/19 1121  NA 134*  K 2.8*  CL 83*  CO2 36*  GLUCOSE 99  BUN 51*  CREATININE 2.80*  CALCIUM 10.3  GFRNONAA 20*  GFRAA 23*  ANIONGAP 15    Recent Labs  Lab 07/03/19 1121  PROT 7.7  ALBUMIN 3.9  AST 31  ALT 17  ALKPHOS 79  BILITOT 0.9   Hematology Recent Labs  Lab 07/03/19 1121  WBC 11.0*  RBC 6.00*  HGB 13.2  HCT 41.7  MCV 69.5*  MCH 22.0*  MCHC 31.7  RDW 15.4  PLT 244   BNPNo results for input(s): BNP, PROBNP in the last 168 hours.  DDimer No  results for input(s): DDIMER in the last 168 hours.   Radiology/Studies:  Dg Chest Port 1 View  Result Date: 07/03/2019 CLINICAL DATA:  Weakness and shortness of breath EXAM: PORTABLE CHEST 1 VIEW COMPARISON:  06/02/2019 and prior radiographs FINDINGS: Cardiomegaly noted. LEFT LOWER lung atelectasis and possible small LEFT pleural effusion again noted. No evidence of pneumothorax. No acute bony abnormality identified. IMPRESSION: Unchanged appearance of the chest with LEFT LOWER lung atelectasis and possible small LEFT pleural effusion. Electronically Signed   By: Margarette Canada M.D.   On: 07/03/2019 11:54    Assessment and Plan:   Bradycardia  - Patient admitted with altered mental status. Cardiology consulted for bradycardia. - Telemetry shows sinus rhythm with heart rates ranging from the high 30's to 90's and 1st degree AV block. No high grade AV block noted. Rates in the 90's to 100's while I was in the room. BP stable. - Potassium 2.8. - TSH pending. - Will order Magnesium. - Lopressor 25mg  twice listed as PTA medication. Could consider holding. - Continue to monitor on telemetry. - Will check Echo.  Chest Pain - Per chart review, reason EMS was initial called was for chest pain but pain reportedly resolved by the time they arrived.  - Normal coronaries on cardiac catheterization in 2008. - EKG shows Q waves in inferior and anterior leads but no acute ST/T changes. - High-sensitivity troponin minimally elevated and flat at 28 >> 26. Not consistent with ACS. - Will check Echo.  Chronic Diastolic CHF - Most recent Echo from 11/2016 showed LVEF of 55-60% with grade 1 diastolic dysfunction. - PTA medications include Lasix 80mg  twice daily and Metolazone 5mg  on Tuesdays and Fridays for lower extremity edema. Patient does not appear significantly volume overloaded on exam. Only minimal lower extremity edema. Hold diuretics given renal function. - Will repeat Echo. - Continue to monitor  daily weights, strict I/O's, and renal function.  Questionable Paroxysmal Atrial Fibrillation - Paroxysmal atrial fibrillation listed on prior notes included last hospital admission and discharge. Patient not on any anticoagulation at home. I personally reviewed prior EKGs in system and do not appear to be consistent with atrial fibrillation. There is a lot of baseline artifact but P waves can be seen. - Currently in sinus rhythm on telemetry. - Continue to monitor on telemetry.  Acute on Chronic Kidney Disease - Serum creatinine 2.80 on admission. Was 1.25 on 06/14/2019. - Primary team going to start IV fluids. - Continue to monitor daily BMETs.  Hypokalemia - Potassium 2.8 on admission. Currently being repleted. - Continue to monitor.  Acute Encephalopathy - Head CT showed no acute abnormalities. - Management per primary team.  UTI - Urinalysis showed  cloudy appearance, large leukocytes, >50 bacteria, and many bacteria. Urine culture pending. - Continue  antibiotics per primary team.  History of Stroke - Continue home Aspirin 81mg  daily, Plavix 75mg  daily, and Simvastatin 20mg  daily.  Diabetes Mellitus - Management per primary team.  Alpha Thalassemia - Hemoglobin 13.2 on admission. - Management per primary team.     For questions or updates, please contact [place] Please consult [email or web address] for contact info under   Signed, Darreld Mclean, PA-C  07/03/2019 3:56 PM    Patient seen and examined. Agree with assessment and plan.  Jared Hall is an 85 year old gentleman who is a resident of a skilled nursing facility was brought to the emergency room today for evaluation of some chest discomfort.  His pain resolved.  He is somnolent and not able to pain a history from the patient.  In 2008 he underwent cardiac catheterization by Dr. Radford Pax and was found to have normal coronary arteries.  He has a history of diabetes mellitus, remote history of stroke for which he  has been on aspirin and Plavix, hyperlipidemia on simvastatin.  An echo Doppler study in January 2018 showed moderate left ventricular hypertrophy with normal systolic function and grade 1 diastolic dysfunction.  There was aortic valve sclerosis without stenosis.  PA pressure could not be assessed.  We are asked to see for evaluation of bradycardia.  ECG upon presentation dependently reviewed by me shows normal sinus rhythm at 87 bpm.  There are Q waves inferiorly as well as anteriorly.  QTc interval is prolonged at 568.  There are no significant T wave abnormalities.  Telemetry reveals sinus rhythm with sinus arrhythmia most likely affected by respirations and he has had transient episodes of heart rate decreasing to 38-40 with rates up to the 90s.  On exam he is somewhat disheveled in appearance and somnolent.  He is bearded.  He has a thick neck.  I did not hear carotid bruits.  There was decreased breath sounds at the bases without wheezing.  Rhythm was regular with a 1/6 systolic murmur.  There was no chest wall tenderness.  Bowel sounds were positive.  He had areas of erythema in his right lower extremity and posterior left lower extremity with mild ankle edema.  Laboratory is notable for potassium 2.9 which may be contributing to his prolonged QT interval.  CO2 increased at 36 most likely represents contraction alkalosis from overdiuresis.  Creatinine is increased at 2.8.  Magnesium 2.1.  Hemoglobin and hematocrit are stable and there is microcytic indices consistent with his alpha thalassemia.  COVID swab is negative.  Patient has been on aspirin and Plavix, insulin, metoprolol tartrate 25 mg twice a day, furosemide 80 mg twice a day and metolazone 5 mg on Tuesday and Friday.  He has acute kidney injury with contraction alkalosis undoubtedly contributed by over diuresis which will need to be held.  Will hold beta-blocker therapy.  Recommend follow-up echo Doppler study tickly with inferior and anterior Q  waves for reassessment of LV function and wall motion.  High-sensitivity troponin minimally elevated not consistent with acute coronary syndrome.  Will follow.  Troy Sine, MD, [facility] 07/03/2019 5:50 PM

## 2019-07-03 NOTE — H&P (Signed)
History and Physical    Jared Hall QMG:[id number] DOB: [birth year] DOA: 07/03/2019  Referring MD/NP/PA: Aletta Edouard, MD PCP: Josetta Huddle, MD  Patient coming [facility]: [facility]  Chief Complaint: Altered mental status  I have personally briefly reviewed patient's old medical records in [place]   HPI: Jared Hall is Hall 85 y.o. male with medical history significant of hypertension, hyperlipidemia, diabetes mellitus type 2, history of stroke, paroxysmal atrial fibrillation not on anticoagulation, OSA, and alpha thalassemia; who presented from the nursing facility initially with reported complaints of chest pain.  Reports note that chest pain resolved prior to EMS arrival.  History is limited however as patient found to be altered and hard to arouse currently.  Cannot get him to answer questions at this time.   ED Course: Upon admission into the emergency department patient was noted to be afebrile, pulse 73-88, respirations 15-25, blood pressures 142 138/103, and O2 saturations maintained on room air.  Labs revealed WBC 11, sodium 134, potassium 2.8, chloride 83, CO2 36, BUN 55, and creatinine 2.8.  COVID-19 testing negative. Urinalysis positive for small hemoglobin, large leukocytes, many bacteria, and greater than 50 WBCs.  Patient was given 500 mL of normal saline, 40 mEq of potassium chloride IV, and Rocephin.  Nursing staff while in the ED noted heart rates going down into the 30s with appearance of first-degree heart block.  Review of Systems  Unable to perform ROS: Mental status change    Past Medical History:  Diagnosis Date  . Alpha thalassemia ([place])   . Anemia 01/01/2017  . BPH (benign prostatic hyperplasia)   . CHF (congestive heart failure) ([place])   . High cholesterol   . Hypertension   . Lower extremity edema   . Onychomycosis   . Prostate cancer ([place])    S/P "8 weeks of radiation"  . Prostatitis    recurrent  . Sleep apnea   . Stroke [facility]) ~ 2005   denies residual on 2/123/2015  . Type II diabetes mellitus ([place])   . Umbilical hernia   . Urinary urgency    with incontinence    Past Surgical History:  Procedure Laterality Date  . ADENOIDECTOMY    . ESOPHAGOGASTRODUODENOSCOPY (EGD) WITH PROPOFOL N/Hall 06/10/2019   Procedure: ESOPHAGOGASTRODUODENOSCOPY (EGD) WITH PROPOFOL;  Surgeon: Ronald Lobo, MD;  Location: [facility];  Service: Endoscopy;  Laterality: N/Hall;  . HERNIA REPAIR    . MASTOIDECTOMY    . TONSILLECTOMY    . VASECTOMY    . VENTRAL HERNIA REPAIR  12/18/2011   Procedure: HERNIA REPAIR VENTRAL ADULT;  Surgeon: Adin Hector, MD;  Location: [facility];  Service: General;  Laterality: N/Hall;     reports that he has never smoked. He has never used smokeless tobacco. He reports that he does not drink alcohol or use drugs.  Allergies  Allergen Reactions  . Ativan [Lorazepam] Other (See Comments)    Flailing of extremities noted immediately s/p IV administration.  [name] Kitchen Flomax [Tamsulosin Hcl] Other (See Comments)    Excessive tearing  . Glucophage [Metformin Hcl] Diarrhea    Family History  Problem Relation Age of Onset  . Pneumonia Mother   . Heart attack Father     Prior to Admission medications   Medication Sig Start Date End Date Taking? Authorizing Provider  acetaminophen (TYLENOL) 500 MG tablet Take 500 mg by mouth every 6 (six) hours as needed for fever.    [provider]  amitriptyline (ELAVIL) 10  MG tablet Take 10 mg by mouth at bedtime.    [provider]  aspirin EC 81 MG tablet Take 81 mg by mouth daily.    [provider]  azelastine (OPTIVAR) 0.05 % ophthalmic solution Place 1 drop into both eyes 2 (two) times daily as needed (dry eyes).     [provider]  Benzocaine-Menthol (CEPACOL) 15-2.3 MG LOZG Use as directed 1 lozenge in the mouth or throat 4 (four) times daily as needed (cough).    [provider]  clopidogrel (PLAVIX) 75 MG tablet Take 75 mg by mouth  daily.     [provider]  furosemide (LASIX) 80 MG tablet Take 80 mg by mouth 2 (two) times daily.     [provider]  insulin aspart (NOVOLOG FLEXPEN) 100 UNIT/ML FlexPen Inject 5 Units into the skin 3 (three) times daily with meals.    [provider]  insulin glargine (LANTUS) 100 UNIT/ML injection Inject 0.1 mLs (10 Units total) into the skin daily. 06/14/19   Jared Ratel Hall, Jared Hall  metolazone (ZAROXOLYN) 5 MG tablet Take 5 mg by mouth as directed. Take 1 tablet every Tues & Fri. Hold If No Leg Edema 05/20/19   [provider]  metoprolol tartrate (LOPRESSOR) 25 MG tablet Take 1 tablet (25 mg total) by mouth 2 (two) times daily. 03/17/15   Kelvin Cellar, MD  pantoprazole (PROTONIX) 40 MG tablet Take 40 mg by mouth daily.     [provider]  Polyethyl Glycol-Propyl Glycol (SYSTANE) 0.4-0.3 % GEL ophthalmic gel Place 1 application into both eyes 3 (three) times daily as needed (dry eyes).    [provider]  potassium chloride SA (K-DUR,KLOR-CON) 20 MEQ tablet Take 20-40 mEq by mouth as directed. Take 1 tablet on (Sun, Mon, Wed, Thurs & Sat.) & Take 2 tablets on (Tues & Fri) if Metolazone is taken.    [provider]  senna-docusate (SENOKOT-S) 8.6-50 MG tablet Take 1 tablet by mouth 2 (two) times daily.    [provider]  simvastatin (ZOCOR) 20 MG tablet Take 20 mg by mouth daily.    [provider]  traMADol (ULTRAM) 50 MG tablet Take 1 tablet (50 mg total) by mouth every 6 (six) hours as needed for moderate pain. 05/24/18   Ghimire, Henreitta Leber, MD  vitamin B-12 (CYANOCOBALAMIN) 1000 MCG tablet Take 1,000 mcg by mouth daily.    [provider]  Vitamin D, Cholecalciferol, 50 MCG (2000 UT) CAPS Take 1 capsule by mouth daily.    [provider]    Physical Exam:  Constitutional: Elderly male who is lethargic and difficult to arouse Vitals:   07/03/19 1315 07/03/19 1330 07/03/19 1345 07/03/19 1400   BP: (!) 115/39 117/62 (!) 110/93 (!) 115/104  Pulse: 79 80    Resp: 18 18 (!) 23 (!) 21  Temp:      TempSrc:      SpO2: 100% 100%     Eyes: PERRL, lids and conjunctivae normal ENMT: Mucous membranes are dry.  Posterior pharynx clear of any exudate or lesions.   Neck: normal, supple, no masses, no thyromegaly Respiratory: clear to auscultation bilaterally, no wheezing, no crackles. Normal respiratory effort. No accessory muscle use.  Cardiovascular: Regular rate and rhythm, no murmurs / rubs / gallops.  Trace lower extremity edema. 2+ pedal pulses. No carotid bruits.  Abdomen: no tenderness, no masses palpated. No hepatosplenomegaly. Bowel sounds positive.  Musculoskeletal: no clubbing / cyanosis. No joint deformity upper  and lower extremities. Good ROM, no contractures. Normal muscle tone.  Skin: no rashes, lesions, ulcers. No induration Neurologic: Able to move all extremities Psychiatric: Lethargic unable to assess at this time.    Labs on Admission: I have personally reviewed following labs and imaging studies  CBC: Recent Labs  Lab 07/03/19 1121  WBC 11.0*  NEUTROABS 8.4*  HGB 13.2  HCT 41.7  MCV 69.5*  PLT 629   Basic Metabolic Panel: Recent Labs  Lab 07/03/19 1121  NA 134*  K 2.8*  CL 83*  CO2 36*  GLUCOSE 99  BUN 51*  CREATININE 2.80*  CALCIUM 10.3   GFR: CrCl cannot be calculated (Unknown ideal weight.). Liver Function Tests: Recent Labs  Lab 07/03/19 1121  AST 31  ALT 17  ALKPHOS 79  BILITOT 0.9  PROT 7.7  ALBUMIN 3.9   Recent Labs  Lab 07/03/19 1121  LIPASE 46   Recent Labs  Lab 07/03/19 1156  AMMONIA 13   Coagulation Profile: No results for input(s): INR, PROTIME in the last 168 hours. Cardiac Enzymes: No results for input(s): CKTOTAL, CKMB, CKMBINDEX, TROPONINI in the last 168 hours. BNP (last 3 results) No results for input(s): PROBNP in the last 8760 hours. HbA1C: No results for input(s): HGBA1C in the last 72 hours. CBG:  Recent Labs  Lab 07/03/19 1130  GLUCAP 113*   Lipid Profile: No results for input(s): CHOL, HDL, LDLCALC, TRIG, CHOLHDL, LDLDIRECT in the last 72 hours. Thyroid Function Tests: No results for input(s): TSH, T4TOTAL, FREET4, T3FREE, THYROIDAB in the last 72 hours. Anemia Panel: No results for input(s): VITAMINB12, FOLATE, FERRITIN, TIBC, IRON, RETICCTPCT in the last 72 hours. Urine analysis:    Component Value Date/Time   COLORURINE YELLOW 07/03/2019 1121   APPEARANCEUR CLOUDY (Hall) 07/03/2019 1121   LABSPEC 1.006 07/03/2019 1121   PHURINE 7.0 07/03/2019 1121   GLUCOSEU NEGATIVE 07/03/2019 1121   HGBUR SMALL ([place]) 07/03/2019 1121   BILIRUBINUR NEGATIVE 07/03/2019 1121   KETONESUR NEGATIVE 07/03/2019 1121   PROTEINUR NEGATIVE 07/03/2019 1121   UROBILINOGEN 1.0 12/06/2014 1414   NITRITE NEGATIVE 07/03/2019 1121   LEUKOCYTESUR LARGE ([place]) 07/03/2019 1121   Sepsis Labs: Recent Results (from the past 240 hour(s))  SARS Coronavirus 2 [facility] order, Performed in [facility] hospital lab) Nasopharyngeal Nasopharyngeal Swab     Status: None   Collection Time: 07/03/19 11:22 AM   Specimen: Nasopharyngeal Swab  Result Value Ref Range Status   SARS Coronavirus 2 NEGATIVE NEGATIVE Final    Comment: (NOTE) If result is NEGATIVE SARS-CoV-2 target nucleic acids are NOT DETECTED. The SARS-CoV-2 RNA is generally detectable in upper and lower  respiratory specimens during the acute phase of infection. The lowest  concentration of SARS-CoV-2 viral copies this assay can detect is 250  copies / mL. Hall negative result does not preclude SARS-CoV-2 infection  and should not be used as the sole basis for treatment or other  patient management decisions.  Hall negative result may occur with  improper specimen collection / handling, submission of specimen other  than nasopharyngeal swab, presence of viral mutation(s) within the  areas targeted by this assay, and inadequate number of viral copies  (<250  copies / mL). Hall negative result must be combined with clinical  observations, patient history, and epidemiological information. If result is POSITIVE SARS-CoV-2 target nucleic acids are DETECTED. The SARS-CoV-2 RNA is generally detectable in upper and lower  respiratory specimens dur ing the acute phase of infection.  Positive  results  are indicative of active infection with SARS-CoV-2.  Clinical  correlation with patient history and other diagnostic information is  necessary to determine patient infection status.  Positive results Jared Hall  not rule out bacterial infection or co-infection with other viruses. If result is PRESUMPTIVE POSTIVE SARS-CoV-2 nucleic acids MAY BE PRESENT.   Hall presumptive positive result was obtained on the submitted specimen  and confirmed on repeat testing.  While 2019 novel coronavirus  (SARS-CoV-2) nucleic acids may be present in the submitted sample  additional confirmatory testing may be necessary for epidemiological  and / or clinical management purposes  to differentiate between  SARS-CoV-2 and other Sarbecovirus currently known to infect humans.  If clinically indicated additional testing with an alternate test  methodology [phone]) is advised. The SARS-CoV-2 RNA is generally  detectable in upper and lower respiratory sp ecimens during the acute  phase of infection. The expected result is Negative. Fact Sheet for Patients:  StrictlyIdeas.no Fact Sheet for Healthcare Providers: BankingDealers.co.za This test is not yet approved or cleared by the [place] FDA and has been authorized for detection and/or diagnosis of SARS-CoV-2 by FDA under an Emergency Use Authorization (EUA).  This EUA will remain in effect (meaning this test can be used) for the duration of the COVID-19 declaration under Section 564(b)(1) of the Act, 21 U.S.C. section 360bbb-3(b)(1), unless the authorization is terminated or revoked  sooner. Performed at [facility], [place] [address] [place], [place] [address]      Radiological Exams on Admission: Dg Chest Port 1 View  Result Date: 07/03/2019 CLINICAL DATA:  Weakness and shortness of breath EXAM: PORTABLE CHEST 1 VIEW COMPARISON:  06/02/2019 and prior radiographs FINDINGS: Cardiomegaly noted. LEFT LOWER lung atelectasis and possible small LEFT pleural effusion again noted. No evidence of pneumothorax. No acute bony abnormality identified. IMPRESSION: Unchanged appearance of the chest with LEFT LOWER lung atelectasis and possible small LEFT pleural effusion. Electronically Signed   By: Margarette Canada M.D.   On: 07/03/2019 11:54    EKG: Independently reviewed.  Sinus rhythm at 87 bpm with first-degree AV block and Q waves in the inferior and anterior leads.  Assessment/Plan Acute encephalopathy: Patient presented from the nursing facility after complaining of chest pain and was found to be lethargic and -Admit to [facility] progressive bed -Neurochecks  -N.p.o. -Aspiration precaution -Check ABG  - Will place on BiPAP( if needed) -Follow-up CT scan of the brain   Acute renal failure superimposed on chronic kidney disease stage III: Patient's baseline creatinine previously noted to be around 1.2 last, but presents with creatinine elevated up to 2.8 with BUN 51.   -Strict intake and output  -Check urine creatinine, urine urea -Normal saline IV fluids at 75 mL -Hold diuretics -Recheck creatinine in Hall.m.  Chest pain: Troponin is mildly elevated at 26 and 28.  EKG with Q waves in the inferior and anterior leads.  Patient with previous history of negative cardiac cath in 2018. -Follow-up repeat echo per cardiology -[place] [facility] consultative services  Urinary tract infection: Acute.  Urinalysis positive for signs of infection.  Patient was started on Rocephin IV. -Check urine culture -Continue Rocephin IV  Leukocytosis: Acute.  Initial WBC 11.  Suspect this could  be secondary to the urinary tract infection. -Recheck CBC in Hall.m.  Hypokalemia: Acute.  On admission potassium noted to be 2.8.  Ordered to be given 40 mEq of potassium chloride IV. -Give additional 30 mEq of potassium chloride IV -Check magnesium level -Continue to  monitor and replace as needed  Diastolic CHF: Patient with 1+ pitting edema of the bilateral lower extremities.  No significant JVD appreciated.  Last EF noted to be 55 to 60% with grade 1 diastolic dysfunction in 06/1839.   -Strict intake and output -Daily weight  Diabetes mellitus: Hemoglobin A1c noted to be 8.2 on 06/08/2019.  Patient currently altered and n.p.o. therefore we will hold IV insulin at this time. -Hypoglycemic protocol -CBGs every 6 hours -Restart insulin when tolerating Hall diet  Essential hypertension -Restart home blood pressure medications when medically  History of stroke -Continue aspirin, statin, and Plavix when able  History of prostate cancer s/p radiation treatment  DVT prophylaxis: heparin Code Status: full Family Communication: No family present at bedside Disposition Plan: Likely discharge back to skilled nursing facility once medically stable Consults called: Cardiology Admission status: observation  Norval Morton MD [facility] Pager [phone]   If 7PM-7AM, please contact night-coverage [email or web address] Password [facility]  07/03/2019, 2:45 PM

## 2019-07-04 ENCOUNTER — Inpatient Hospital Stay (HOSPITAL_COMMUNITY): Payer: Medicare Other

## 2019-07-04 DIAGNOSIS — N183 Chronic kidney disease, stage 3 (moderate): Secondary | ICD-10-CM

## 2019-07-04 DIAGNOSIS — I495 Sick sinus syndrome: Secondary | ICD-10-CM

## 2019-07-04 DIAGNOSIS — R0789 Other chest pain: Secondary | ICD-10-CM

## 2019-07-04 LAB — BASIC METABOLIC PANEL
Anion gap: 14 (ref 5–15)
BUN: 41 mg/dL — ABNORMAL HIGH (ref 8–23)
CO2: 31 mmol/L (ref 22–32)
Calcium: 9.3 mg/dL (ref 8.9–10.3)
Chloride: 92 mmol/L — ABNORMAL LOW (ref 98–111)
Creatinine, Ser: 2.12 mg/dL — ABNORMAL HIGH (ref 0.61–1.24)
GFR calc Af Amer: 32 mL/min — ABNORMAL LOW (ref >60)
GFR calc non Af Amer: 27 mL/min — ABNORMAL LOW (ref >60)
Glucose, Bld: 132 mg/dL — ABNORMAL HIGH (ref 70–99)
Potassium: 3.5 mmol/L (ref 3.5–5.1)
Sodium: 137 mmol/L (ref 135–145)

## 2019-07-04 LAB — MAGNESIUM: Magnesium: 1.6 mg/dL — ABNORMAL LOW (ref 1.7–2.4)

## 2019-07-04 LAB — CBC
HCT: 35.8 % — ABNORMAL LOW (ref 39.0–52.0)
Hemoglobin: 11 g/dL — ABNORMAL LOW (ref 13.0–17.0)
MCH: 21.7 pg — ABNORMAL LOW (ref 26.0–34.0)
MCHC: 30.7 g/dL (ref 30.0–36.0)
MCV: 70.5 fL — ABNORMAL LOW (ref 80.0–100.0)
Platelets: 216 10*3/uL (ref 150–400)
RBC: 5.08 MIL/uL (ref 4.22–5.81)
RDW: 15 % (ref 11.5–15.5)
WBC: 8.8 10*3/uL (ref 4.0–10.5)
nRBC: 0 % (ref 0.0–0.2)

## 2019-07-04 LAB — GLUCOSE, CAPILLARY
Glucose-Capillary: 127 mg/dL — ABNORMAL HIGH (ref 70–99)
Glucose-Capillary: 131 mg/dL — ABNORMAL HIGH (ref 70–99)
Glucose-Capillary: 133 mg/dL — ABNORMAL HIGH (ref 70–99)

## 2019-07-04 LAB — CREATININE, URINE, RANDOM: Creatinine, Urine: 48.88 mg/dL

## 2019-07-04 LAB — ECHOCARDIOGRAM COMPLETE

## 2019-07-04 MED ORDER — POTASSIUM CHLORIDE CRYS ER 20 MEQ PO TBCR: 40.0000 meq | EXTENDED_RELEASE_TABLET | Freq: Once | ORAL | Status: DC

## 2019-07-04 MED ORDER — MAGNESIUM SULFATE 2 GM/50ML IV SOLN
2.0000 g | Freq: Once | INTRAVENOUS | Status: AC
  Administered 2019-07-04: 2 g via INTRAVENOUS
  Filled 2019-07-04: qty 50

## 2019-07-04 MED ORDER — POTASSIUM CHLORIDE 10 MEQ/100ML IV SOLN
10.0000 meq | INTRAVENOUS | Status: AC
  Administered 2019-07-04: 10 meq via INTRAVENOUS

## 2019-07-04 MED ORDER — BIOTENE DRY MOUTH MT LIQD
15.0000 mL | Freq: Three times a day (TID) | OROMUCOSAL | Status: DC
  Administered 2019-07-04 – 2019-07-08 (×13): 15 mL via OROMUCOSAL

## 2019-07-04 MED ORDER — POTASSIUM CHLORIDE 10 MEQ/100ML IV SOLN
10.0000 meq | INTRAVENOUS | Status: AC
  Administered 2019-07-04 (×2): 10 meq via INTRAVENOUS
  Filled 2019-07-04 (×3): qty 100

## 2019-07-04 NOTE — Progress Notes (Signed)
  Echocardiogram 2D Echocardiogram has been performed.  Leveda Kendrix G Ellianne Gowen 07/04/2019, 4:53 PM

## 2019-07-04 NOTE — TOC Initial Note (Signed)
Transition of Care [facility]) - Initial/Assessment Note    Patient Details  Name: Jared Hall MRN: [id number] Date of Birth: 1931-02-22  [facility] CM/SW Contact:    Gelene Mink, [place] Phone Number: 07/04/2019, 3:08 PM  Clinical Narrative:                  CSW called and spoke with the patient's son and daughter-in-law, Zaydn Gutridge. They would like the patient to discharge back to [facility] once he is medically ready. They had no other questions or concerns.   CSW will continue to follow and assist with disposition.    Expected Discharge Plan: Assisted Living Barriers to Discharge: Continued Medical Work up   Patient Goals and CMS Choice Patient states their goals for this hospitalization and ongoing recovery are:: Pt son and daughter-in-law would like the patient to return to [facility] once medically stable   Choice offered to / list presented to : NA  Expected Discharge Plan and Services Expected Discharge Plan: Assisted Living In-house Referral: Clinical Social Work Discharge Planning Services: NA Post Acute Care Choice: NA Living arrangements for the past 2 months: Assisted Living Facility                 DME Arranged: N/A DME Agency: NA       HH Arranged: NA [place] Agency: NA        Prior Living Arrangements/Services Living arrangements for the past 2 months: [place] Lives with:: Facility Resident Patient language and need for interpreter reviewed:: No Do you feel safe going back to the place where you live?: Yes      Need for Family Participation in Patient Care: Yes (Comment) Care giver support system in place?: Yes (comment)   Criminal Activity/Legal Involvement Pertinent to Current Situation/Hospitalization: No - Comment as needed  Activities of Daily Living      Permission Sought/Granted Permission sought to share information with : Case Manager Permission granted to share information with : Yes,  Verbal Permission Granted  Share Information with NAME: Dom Haverland     Permission granted to share info w Relationship: Son     Emotional Assessment Appearance:: Appears stated age Attitude/Demeanor/Rapport: Unable to Assess Affect (typically observed): Unable to Assess Orientation: : Fluctuating Orientation (Suspected and/or reported [place]) Alcohol / Substance Use: Not Applicable Psych Involvement: No (comment)  Admission diagnosis:  Hypokalemia [E87.6] Acute encephalopathy [G93.40] AKI (acute kidney injury) ([name]) [N17.9] Patient Active Problem List   Diagnosis Date Noted  . Leukocytosis 07/03/2019  . Acute renal failure superimposed on chronic kidney disease ([place]) 07/03/2019  . Chest pain 07/03/2019  . Urinary tract infection 07/03/2019  . Hypokalemia 06/02/2019  . TIA (transient ischemic attack) 05/22/2018  . Cellulitis in diabetic foot ([place]) 05/22/2018  . Tremor 10/17/2017  . Acute encephalopathy   . Acute ischemic stroke ([place])   . Generalized weakness   . Cerebrovascular disease   . Altered mental status   . Acute respiratory failure with hypercapnia ([place])   . CVA (cerebral vascular accident) ([place]) 01/01/2017  . Dehydration 01/01/2017  . Anemia 01/01/2017  . Acute respiratory failure ([place]) 12/15/2016  . Morbid obesity ([place]) 12/15/2016  . Hard of hearing 12/15/2016  . Chronic kidney disease, stage III (moderate) ([place]) 12/06/2016  . Bell's palsy   . Facial droop   . Paroxysmal atrial fibrillation (H[facility])   . Embolic stroke ([place])   . Dysphagia 03/14/2015  . Dysarthria 03/14/2015  . Stroke (  [place]) 03/14/2015  . Cellulitis 01/11/2014  . Bilateral lower extremity edema 01/11/2014  . Venous stasis dermatitis 01/11/2014  . DM type 2 with diabetic peripheral neuropathy ([place]) 01/11/2014  . Cellulitis and abscess of leg 01/11/2014  . Cellulitis and abscess 01/11/2014  . Depression 06/15/2013  . GERD (gastroesophageal reflux disease) 06/15/2013  . Constipation  06/15/2013  . Diabetic neuropathy ([place]) 06/15/2013  . Type 1 diabetes mellitus with peripheral circulatory complications ([place]) 86/38/1771  . Essential hypertension, benign 04/07/2013  . AKI (acute kidney injury) ([place]) 04/04/2013  . Bilateral lower leg cellulitis 04/04/2013  . Right hip pain 04/04/2013  . Renal insufficiency 12/16/2011  . History of CVA (cerebrovascular accident) 12/16/2011  . Dyslipidemia 12/16/2011  . Obesity (BMI 30-39.9) 12/16/2011  . Umbilical hernia with obstruction-partial on CT scan; easily reducible 12/14/2011  . History of PSVT (paroxysmal supraventricular tachycardia) 12/14/2011  . DM (diabetes mellitus) ([place]) 12/14/2011   PCP:  Josetta Huddle, MD Pharmacy:  No Pharmacies Listed    Social Determinants of Health (SDOH) Interventions    Readmission Risk Interventions Readmission Risk Prevention Plan 06/09/2019  Transportation Screening Complete  PCP or Specialist Appt within 3-5 Days Not Complete  Not Complete comments not ready for d/c, plan to go to SNF  [place] or [place] Complete  Social Work Consult for [place] Planning/Counseling Complete  Palliative Care Screening Not Applicable  Medication Review Press photographer) Complete  Some recent data might be hidden

## 2019-07-04 NOTE — Evaluation (Signed)
Clinical/Bedside Swallow Evaluation Patient Details  Name: Jared Hall MRN: [id number] Date of Birth: [birth year]  Today's Date: 07/04/2019 Time: SLP Start Time (ACUTE ONLY): 1200 SLP Stop Time (ACUTE ONLY): 1220 SLP Time Calculation (min) (ACUTE ONLY): 20 min  Past Medical History:  Past Medical History:  Diagnosis Date  . Alpha thalassemia ([place])   . Anemia 01/01/2017  . BPH (benign prostatic hyperplasia)   . CHF (congestive heart failure) ([place])   . High cholesterol   . Hypertension   . Lower extremity edema   . Onychomycosis   . Prostate cancer ([place])    S/P "8 weeks of radiation"  . Prostatitis    recurrent  . Sleep apnea   . Stroke [facility]) ~ 2005   denies residual on 2/123/2015  . Type II diabetes mellitus ([place])   . Umbilical hernia   . Urinary urgency    with incontinence   Past Surgical History:  Past Surgical History:  Procedure Laterality Date  . ADENOIDECTOMY    . ESOPHAGOGASTRODUODENOSCOPY (EGD) WITH PROPOFOL N/A 06/10/2019   Procedure: ESOPHAGOGASTRODUODENOSCOPY (EGD) WITH PROPOFOL;  Surgeon: Ronald Lobo, MD;  Location: [facility];  Service: Endoscopy;  Laterality: N/A;  . HERNIA REPAIR    . MASTOIDECTOMY    . TONSILLECTOMY    . VASECTOMY    . VENTRAL HERNIA REPAIR  12/18/2011   Procedure: HERNIA REPAIR VENTRAL ADULT;  Surgeon: Adin Hector, MD;  Location: [facility];  Service: General;  Laterality: N/A;   HPI:  Jared Hall is a 85 y.o. male with medical history significant of hypertension, hyperlipidemia, diabetes mellitus type 2, history of stroke, paroxysmal atrial fibrillation not on anticoagulation, OSA, and alpha thalassemia; who presented from the nursing facility initially with reported complaints of chest pain.  Reports note that chest pain resolved prior to EMS arrival.  History is limited however as patient found to be altered and hard to arouse currently.  He was unable to answer questions at that time.  He was found to have acute ecephalopathy, UTI  and acute renal failure.  Chest xray showing unchanged appearance of the chest from 7/13 with LLL  atelectasis and possible pleural effusion.   Assessment / Plan / Recommendation Clinical Impression  Clinical swallowing evaluation was completed using ice chips and thin liquids via spoon sips.  Patient is known to [place] service from previous admission in April of 2016 with MBS completed.  At that time he was recommended to be on a dysphagia 1 diet with nectar thick liquids.  Nursing reported that patient was lethargic but did rouse to stimluation and that he was able to follow some simple commands.  Cranial exam was attempted and the patient was unable to follow commands.  Oral care was attempted and the patient clamped his lips shut.  Given an ice chip he removed it from his oral cavity.  Given spoon sips of thin liquids significant anterior escape was seen as well as delayed coughing.  He declined any further PO trials by clamping his lips shut given presentation of the spoon to his lips.  It is difficult to tell what role lethargy is playing in his swallowing issues this date especially given previous history of dysphagia.  Recommend that he remain NPO pending ST follow up for PO readiness next date.   SLP Visit Diagnosis: Dysphagia, unspecified (R13.10)    Aspiration Risk  Moderate aspiration risk    Diet Recommendation   NPO  Medication Administration: Via alternative means  Other  Recommendations Oral Care Recommendations: Oral care QID   Follow up Recommendations Other (comment)(TBD)      Frequency and Duration min 2x/week  2 weeks       Prognosis Prognosis for Safe Diet Advancement: Good Barriers to Reach Goals: Cognitive deficits      Swallow Study   General Date of Onset: 07/03/19 HPI: Jared Hall is a 85 y.o. male with medical history significant of hypertension, hyperlipidemia, diabetes mellitus type 2, history of stroke, paroxysmal atrial fibrillation not on anticoagulation,  OSA, and alpha thalassemia; who presented from the nursing facility initially with reported complaints of chest pain.  Reports note that chest pain resolved prior to EMS arrival.  History is limited however as patient found to be altered and hard to arouse currently.  He was unable to answer questions at that time.  He was found to have acute ecephalopathy, UTI and acute renal failure.  Chest xray showing unchanged appearance of the chest from 7/13 with LLL  atelectasis and possible pleural effusion. Type of Study: Bedside Swallow Evaluation Previous Swallow Assessment: BSE 03/15/2015 Rx D1/NTL, MBS 03/16/2015 Rx D1/NTL Diet Prior to this Study: NPO Temperature Spikes Noted: No History of Recent Intubation: No Behavior/Cognition: Confused;Lethargic/Drowsy;Doesn't follow directions Oral Cavity Assessment: Other (comment)(could not assess) Oral Care Completed by SLP: Other (Comment)(attempted, pt clamped mouth shut) Self-Feeding Abilities: Total assist Patient Positioning: Upright in bed Baseline Vocal Quality: Not observed Volitional Swallow: Unable to elicit    Oral/Motor/Sensory Function Overall Oral Motor/Sensory Function: Other (comment)(unable to assess)   Ice Chips Ice chips: (Pt removed ice chip from his mouth.) Presentation: Spoon   Thin Liquid Thin Liquid: Impaired Presentation: Spoon Oral Phase Impairments: Reduced labial seal Oral Phase Functional Implications: Left anterior spillage Pharyngeal  Phase Impairments: Cough - Delayed    Nectar Thick Nectar Thick Liquid: (Declined trials)   Honey Thick     Puree     Solid           Shelly Flatten, MA, CCC-SLP Acute Rehab SLP [phone]  Lamar Sprinkles 07/04/2019,12:36 PM

## 2019-07-04 NOTE — Progress Notes (Signed)
OT Cancellation Note  Patient Details Name: Jared Hall MRN: [id number] DOB: May 11, 1931   Cancelled Treatment:    Reason Eval/Treat Not Completed: Other (comment)(Pt with ECHO. OT to evaluate next available treatment day.)   Jared Hall) Jared Hall OTR/L Acute Rehabilitation Services Pager: [phone] Office: [place] 07/04/2019, 4:03 PM

## 2019-07-04 NOTE — Progress Notes (Addendum)
Inpatient Diabetes Program Recommendations  AACE/ADA: New Consensus Statement on Inpatient Glycemic Control (2015)  Target Ranges:  Prepandial:   less than 140 mg/dL      Peak postprandial:   less than 180 mg/dL (1-2 hours)      Critically ill patients:  140 - 180 mg/dL   Results for EMITT, MAGLIONE (MRN [id number]) as of 07/04/2019 10:20  Ref. Range 07/03/2019 11:30 07/04/2019 06:16  Glucose-Capillary Latest Ref Range: 70 - 99 mg/dL 113 (H) 127 (H)    Results for Disch, Mariusz C (MRN [id number]) as of 07/04/2019 10:20  Ref. Range 06/08/2019 06:15  Hemoglobin A1C Latest Ref Range: 4.8 - 5.6 % 8.2 (H)    Admit with: AMS/ CP/ Acute Renal Failure  History: DM  SNF DM Meds: Lantus 10 units Daily     Novolog 5 units TID with meals  Current Orders: CBG checks Q6 hours     MD- Please consider starting Novolog Sensitive Correction Scale/ SSI (0-9 units) Q4 hours     --Will follow patient during hospitalization--  Wyn Quaker RN, MSN, CDE Diabetes Coordinator Inpatient Glycemic Control Team Team Pager: [phone] (8a-5p)

## 2019-07-04 NOTE — Progress Notes (Signed)
PROGRESS NOTE    Jared Hall  FAO:[id number] DOB: 1931-06-06 DOA: 07/03/2019 PCP: Josetta Huddle, MD  Brief Narrative:  HPI per Dr. Fuller Plan on 07/03/2019 Jared Hall is a 85 y.o. male with medical history significant of hypertension, hyperlipidemia, diabetes mellitus type 2, history of stroke, paroxysmal atrial fibrillation not on anticoagulation, OSA, and alpha thalassemia; who presented from the nursing facility initially with reported complaints of chest pain.  Reports note that chest pain resolved prior to EMS arrival.  History is limited however as patient found to be altered and hard to arouse currently.  Cannot get him to answer questions at this time.   ED Course: Upon admission into the emergency department patient was noted to be afebrile, pulse 73-88, respirations 15-25, blood pressures 142 138/103, and O2 saturations maintained on room air.  Labs revealed WBC 11, sodium 134, potassium 2.8, chloride 83, CO2 36, BUN 55, and creatinine 2.8.  COVID-19 testing negative. Urinalysis positive for small hemoglobin, large leukocytes, many bacteria, and greater than 50 WBCs.  Patient was given 500 mL of normal saline, 40 mEq of potassium chloride IV, and Rocephin.  Nursing staff while in the ED noted heart rates going down into the 30s with appearance of first-degree heart block.  **Interim History  Patient remains drowsy and encephalopathic.  Cardiology checking echocardiogram.  Bradycardia is resolved.  Patient remains altered and is currently being treated for a urinary tract infection.  Will obtain blood cultures as well.  Assessment & Plan:   Principal Problem:   Acute encephalopathy Active Problems:   DM (diabetes mellitus) (HCC)   Hypokalemia   Leukocytosis   Acute renal failure superimposed on chronic kidney disease (HCC)   Chest pain   Urinary tract infection  Acute Encephalopathy -Patient presented from the nursing facility after complaining of chest pain and was found  to be lethargic and somnolent; remains somnolent and lethargic and withdraws to painful stimuli and minimally opens his eyes -Admitted to a progressive bed -C/w Neurochecks  -N.p.o. for now and obtain SLP evaluation -Aspiration precaution -Check ABG  and showed a pH of 7.400, PCO2 of 57.3, PO2 139.0, bicarbonate level 35.5, and O2 saturation 99% -Will need PT/OT Evaluation -Will place on BiPAP( if needed) -Check blood cultures x2 and follow urine culture; Chest x-ray showed "Unchanged appearance of the chest with LEFT LOWER lung atelectasis and possible small LEFT pleural effusion." -Follow-up CT scan of the Head showed "No acute intracranial abnormality. Atrophy, chronic microvascular disease. Stable right parietal lobe and bilateral cerebellar chronic ischemic infarcts."  Acute Renal Failure superimposed on chronic kidney disease stage III Contraction alkalosis -Patient's baseline creatinine previously noted to be around 1.2 last, but presents with creatinine elevated up to 2.8 with BUN 51.   -C/w Strict intake and output and Monitor OUP -Chloride was 83 and CO2 was 36 on admission is now 4 it is 92 and CO2 is 31 -BUN/creatinine is now improved and is now 41/2.12 -Check urine creatinine, urine urea; Urine Cr was 48.88 -Normal saline IV fluids at 75 mL -Continue to Hold diuretics -Avoid nephrotoxic medications, contrast dyes, hypotension if possible -Continue to monitor and trend renal function -Repeat CMP in a.m.  Chest Pain -Troponin is mildly elevated at 26 and 28 and is not consistent with ACS -Had a cardiac catheterization in 2008 with normal coronaries  -EKG with Q waves in the inferior and anterior leads.  Patient with previous history of negative cardiac cath in 2018. -Follow-up repeat echo per Cardiology  and this is supposed to be done today  -Appreciate Cardiology Evaluation and Further Recommendations  Bradycardia with PVCs and PACs and first-degree AV block -Continue  to hold metoprolol and any other AV nodal blocking agents for now next-check echocardiogram -Replete electrolytes and potassium was 3.5 and magnesium was 1.2 and currently being repleted -Continue to monitor on telemetry as bradycardia has resolved  Urinary Tract Infection -Acute.   -Urinalysis positive for signs of infection as showed cloudy appearance, small hemoglobin, large leukocytes, many bacteria, 0-5 squamous epithelial cells and greater than 50 WBCs.   -Patient was started on Rocephin IV. -Check urine culture and this was not done on admission so we will order one now; ordered blood cultures x2 -Continue Rocephin IV  Leukocytosis, improved -Acute.  Initial WBC 11 on admission and repeat this AM was 8.8.   -Suspect this could be secondary to the urinary tract infection. -Continue to Monitor and Repeat CBC in AM   Hypokalemia -Acute.  On admission potassium noted to be 2.8 on Admision.   -Ordered to be given 40 mEq of potassium chloride IV and given an additional 30 mEq of potassium chloride IV -Will repeat IV 30 mEQ -Checked Magnesium level was 1.6 and will replete -Continue to monitor and Replete as Necessary -Repeat CMP in AM  Hypomagnesemia -Patient's magnesium level this morning is 1.6 -Replete with IV mag sulfate 2 g -Continue monitor replete as necessary -Repeat Magnesium level in the a.m.  Diastolic CHF -Patient with 1+ pitting edema of the bilateral lower extremities.   -No significant JVD appreciated.  Last EF noted to be 55 to 60% with grade 1 diastolic dysfunction in 11/2456.   -Strict I's/O's and Daily Weights -Metoprolol Tartrate 25 mg po BID currently being held -Continue to Hold Furosemide 80 mg BID and Metolazone 5 mg po Daily -Continue to Monitor Volume Status Carefully    Diabetes Mellitus Type 2 -Hemoglobin A1c noted to be 8.2 on 06/08/2019.  Patient currently altered and n.p.o. therefore we will hold IV insulin at this time. -C/w Hypoglycemic  protocol -CBGs every 6 hours -Restart insulin when tolerating a diet; Takes Lantus 10 units sq Daily andSS with Novolog  -CBGs have been ranging from 113-133  Essential Hypertension -Restart home blood pressure medications when medically appropriate -Was on metoprolol Tartrate 25 mg po BID but being held due to Bradycardia -Holding Metolazone 5 mg po Daily and Furosemide 80 mg po BID  History of Stroke -Continue Aspirin 81 mg po Daily, Simvastatin 20 mg po Daily, and Clopidogrel 75 mg po Daily when able  History of prostate cancer s/p radiation treatment BPH -Continue to Monitor Volume Output   Microcytic Anemia and history of alpha thalassemia -Likely was hemoconcentrated on admission and has a dilutional drop now -Patient's hemoglobin/hematocrit went from 13.2/41.7 is now 11.0/35.8 -Check anemia panel in a.m. -Continue to monitor for signs and symptoms of bleeding; currently no overt bleeding noted -Repeat CBC in the a.m.  Hyponatremia/Hypochloremia -In the setting of diuresis  -Improved with IV fluid resuscitation -Patient sodium went from 134 is now 137 -Continue to monitor and trend -Repeat CMP in the a.m.  Obesity -Estimated body mass index is 34.8 kg/m as calculated from the following:   Height as of 06/10/19: 5\' 10"  (1.778 m).   Weight as of 06/14/19: 110 kg. -Not awake enough for Weight Loss and Dietary Counseling   DVT prophylaxis: Heparin 5,000 units sq q8h Code Status: DO NOT RESUSCITATE  Family Communication: No family present at bedside  Disposition Plan: Remain Inpatient for continued workup and treatment  Consultants:   Cardiology Dr. Claiborne Billings   Procedures:  Echocardiogram ordered   Antimicrobials:  Anti-infectives (From admission, onward)   Start     Dose/Rate Route Frequency Ordered Stop   07/04/19 1500  cefTRIAXone (ROCEPHIN) 1 g in sodium chloride 0.9 % 100 mL IVPB     1 g 200 mL/hr over 30 Minutes Intravenous Every 24 hours 07/03/19 1557       07/03/19 1445  cefTRIAXone (ROCEPHIN) 1 g in sodium chloride 0.9 % 100 mL IVPB     1 g 200 mL/hr over 30 Minutes Intravenous  Once 07/03/19 1430 07/03/19 1838     Subjective: Seen and examined at bedside remains somnolent and drowsy and altered.  Responsive to painful stimuli and sternal rubs and awakens and opens eyes and does answer some questions minimally.  Falls back to sleep easily.  No nausea or vomiting.  No other concerns or complaints at this time and will obtain SLP, PT, and OT evaluation  Objective: Vitals:   07/03/19 2049 07/04/19 0001 07/04/19 0412 07/04/19 0810  BP: (!) 148/52 139/66 129/68 (!) 141/52  Pulse: 65 83 82 72  Resp: 20 17 18 18   Temp: 97.6 F (36.4 C) 97.7 F (36.5 C) 98 F (36.7 C) 98.7 F (37.1 C)  TempSrc: Oral Oral Oral Axillary  SpO2: 100% 100% 100% 100%    Intake/Output Summary (Last 24 hours) at 07/04/2019 0854 Last data filed at 07/04/2019 0500 Gross per 24 hour  Intake 1524.52 ml  Output --  Net 1524.52 ml   There were no vitals filed for this visit.  Examination: Physical Exam:  Constitutional: WN/WD obese Caucasian Male in NAD and appears calm, somnolent and extremely drowsy Eyes: Lids and conjunctivae normal, sclerae anicteric  ENMT: External Ears, Nose appear normal. Grossly normal hearing. Mucous membranes are moist. Neck: Appears normal, supple, no cervical masses, normal ROM, no appreciable thyromegaly; no JVD Respiratory: Diminished to auscultation bilaterally, no wheezing, rales, rhonchi or crackles. Normal respiratory effort and patient is not tachypenic. No accessory muscle use.  Cardiovascular: RRR, no murmurs / rubs / gallops. S1 and S2 auscultated. 1+ Jared extremity edema. .  Abdomen: Soft, mildly tender, Distended due to body habiuts. No masses palpated. No appreciable hepatosplenomegaly. Bowel sounds positive x4.  GU: Deferred. Musculoskeletal: No clubbing / cyanosis of digits/nails. No joint deformity upper and lower  extremities.  Skin: Has Jared skin changes and erythema consistent with Venous Stasis and Chronic Leg Swelling Neurologic: Response to painful stimuli minimally to verbal stimuli.  Awakens to sternal rub and opens his eyes does not really follow commands and when I asked him to squeeze his hands but does answer some questions and falls back to sleep Psychiatric: Impaired judgment and insight.  Somnolent and drowsy  Data Reviewed: I have personally reviewed following labs and imaging studies  CBC: Recent Labs  Lab 07/03/19 1121 07/03/19 1613 07/04/19 0434  WBC 11.0*  --  8.8  NEUTROABS 8.4*  --   --   HGB 13.2 11.9* 11.0*  HCT 41.7 35.0* 35.8*  MCV 69.5*  --  70.5*  PLT 244  --  462   Basic Metabolic Panel: Recent Labs  Lab 07/03/19 1121 07/03/19 1613 07/04/19 0434  NA 134* 135 137  K 2.8* 2.9* 3.5  CL 83*  --  92*  CO2 36*  --  31  GLUCOSE 99  --  132*  BUN 51*  --  41*  CREATININE 2.80*  --  2.12*  CALCIUM 10.3  --  9.3  MG  --   --  1.6*   GFR: CrCl cannot be calculated (Unknown ideal weight.). Liver Function Tests: Recent Labs  Lab 07/03/19 1121  AST 31  ALT 17  ALKPHOS 79  BILITOT 0.9  PROT 7.7  ALBUMIN 3.9   Recent Labs  Lab 07/03/19 1121  LIPASE 46   Recent Labs  Lab 07/03/19 1156  AMMONIA 13   Coagulation Profile: No results for input(s): INR, PROTIME in the last 168 hours. Cardiac Enzymes: No results for input(s): CKTOTAL, CKMB, CKMBINDEX, TROPONINI in the last 168 hours. BNP (last 3 results) No results for input(s): PROBNP in the last 8760 hours. HbA1C: No results for input(s): HGBA1C in the last 72 hours. CBG: Recent Labs  Lab 07/03/19 1130 07/04/19 0616  GLUCAP 113* 127*   Lipid Profile: No results for input(s): CHOL, HDL, LDLCALC, TRIG, CHOLHDL, LDLDIRECT in the last 72 hours. Thyroid Function Tests: Recent Labs    07/03/19 1822  TSH 1.269   Anemia Panel: No results for input(s): VITAMINB12, FOLATE, FERRITIN, TIBC, IRON,  RETICCTPCT in the last 72 hours. Sepsis Labs: No results for input(s): PROCALCITON, LATICACIDVEN in the last 168 hours.  Recent Results (from the past 240 hour(s))  SARS Coronavirus 2 [facility] order, Performed in [facility] hospital lab) Nasopharyngeal Nasopharyngeal Swab     Status: None   Collection Time: 07/03/19 11:22 AM   Specimen: Nasopharyngeal Swab  Result Value Ref Range Status   SARS Coronavirus 2 NEGATIVE NEGATIVE Final    Comment: (NOTE) If result is NEGATIVE SARS-CoV-2 target nucleic acids are NOT DETECTED. The SARS-CoV-2 RNA is generally detectable in upper and lower  respiratory specimens during the acute phase of infection. The lowest  concentration of SARS-CoV-2 viral copies this assay can detect is 250  copies / mL. A negative result does not preclude SARS-CoV-2 infection  and should not be used as the sole basis for treatment or other  patient management decisions.  A negative result may occur with  improper specimen collection / handling, submission of specimen other  than nasopharyngeal swab, presence of viral mutation(s) within the  areas targeted by this assay, and inadequate number of viral copies  (<250 copies / mL). A negative result must be combined with clinical  observations, patient history, and epidemiological information. If result is POSITIVE SARS-CoV-2 target nucleic acids are DETECTED. The SARS-CoV-2 RNA is generally detectable in upper and lower  respiratory specimens dur ing the acute phase of infection.  Positive  results are indicative of active infection with SARS-CoV-2.  Clinical  correlation with patient history and other diagnostic information is  necessary to determine patient infection status.  Positive results do  not rule out bacterial infection or co-infection with other viruses. If result is PRESUMPTIVE POSTIVE SARS-CoV-2 nucleic acids MAY BE PRESENT.   A presumptive positive result was obtained on the submitted specimen  and  confirmed on repeat testing.  While 2019 novel coronavirus  (SARS-CoV-2) nucleic acids may be present in the submitted sample  additional confirmatory testing may be necessary for epidemiological  and / or clinical management purposes  to differentiate between  SARS-CoV-2 and other Sarbecovirus currently known to infect humans.  If clinically indicated additional testing with an alternate test  methodology [phone]) is advised. The SARS-CoV-2 RNA is generally  detectable in upper and lower respiratory sp ecimens during the acute  phase of infection. The expected result is Negative. Fact  Sheet for Patients:  StrictlyIdeas.no Fact Sheet for Healthcare Providers: BankingDealers.co.za This test is not yet approved or cleared by the [place] FDA and has been authorized for detection and/or diagnosis of SARS-CoV-2 by FDA under an Emergency Use Authorization (EUA).  This EUA will remain in effect (meaning this test can be used) for the duration of the COVID-19 declaration under Section 564(b)(1) of the Act, 21 U.S.C. section 360bbb-3(b)(1), unless the authorization is terminated or revoked sooner. Performed at [facility], [place] [address], [place], [place] [address]     Radiology Studies: Ct Head Wo Contrast  Result Date: 07/03/2019 CLINICAL DATA:  Altered mental status. Weakness. EXAM: CT HEAD WITHOUT CONTRAST TECHNIQUE: Contiguous axial images were obtained from the base of the skull through the vertex without intravenous contrast. COMPARISON:  May 22, 2018 FINDINGS: Brain: No evidence of acute infarction, hemorrhage, hydrocephalus, extra-axial collection or mass lesion/mass effect. Moderate brain parenchymal volume loss. Advanced chronic small vessel ischemia of the deep white matter. Stable remote right parietal lobe ischemic infarct. Stable appearance of bilateral cerebellar chronic ischemic infarcts. Vascular: Calcific  atherosclerotic disease of the intra cavernous carotid arteries. Skull: Normal. Negative for fracture or focal lesion. Sinuses/Orbits: No acute finding. Other: None. IMPRESSION: 1. No acute intracranial abnormality. 2. Atrophy, chronic microvascular disease. 3. Stable right parietal lobe and bilateral cerebellar chronic ischemic infarcts. Electronically Signed   By: Fidela Salisbury M.D.   On: 07/03/2019 16:14   Dg Chest Port 1 View  Result Date: 07/03/2019 CLINICAL DATA:  Weakness and shortness of breath EXAM: PORTABLE CHEST 1 VIEW COMPARISON:  06/02/2019 and prior radiographs FINDINGS: Cardiomegaly noted. LEFT LOWER lung atelectasis and possible small LEFT pleural effusion again noted. No evidence of pneumothorax. No acute bony abnormality identified. IMPRESSION: Unchanged appearance of the chest with LEFT LOWER lung atelectasis and possible small LEFT pleural effusion. Electronically Signed   By: Margarette Canada M.D.   On: 07/03/2019 11:54   Scheduled Meds:  heparin  5,000 Units Subcutaneous Q8H   potassium chloride  40 mEq Oral Once   sodium chloride flush  3 mL Intravenous Q12H   Continuous Infusions:  sodium chloride 75 mL/hr at 07/04/19 0741   sodium chloride 10 mL/hr at 07/04/19 0500   cefTRIAXone (ROCEPHIN)  IV     magnesium sulfate bolus IVPB      LOS: 1 day   Kerney Elbe, DO [facility] PAGER is on AMION  If 7PM-7AM, please contact night-coverage [email or web address] Password [facility] 07/04/2019, 8:54 AM

## 2019-07-04 NOTE — Progress Notes (Addendum)
Progress Note  Patient Name: Jared Hall Date of Encounter: 07/04/2019  Primary Cardiologist: Dr. Radford Pax   Subjective   Pt resting, confused  Inpatient Medications    Scheduled Meds: . heparin  5,000 Units Subcutaneous Q8H  . sodium chloride flush  3 mL Intravenous Q12H   Continuous Infusions: . sodium chloride 75 mL/hr at 07/04/19 0741  . sodium chloride 10 mL/hr at 07/04/19 0500  . cefTRIAXone (ROCEPHIN)  IV    . magnesium sulfate bolus IVPB 2 g (07/04/19 1053)  . potassium chloride     PRN Meds: sodium chloride, acetaminophen **OR** acetaminophen, ondansetron **OR** ondansetron (ZOFRAN) IV   Vital Signs    Vitals:   07/03/19 2049 07/04/19 0001 07/04/19 0412 07/04/19 0810  BP: (!) 148/52 139/66 129/68 (!) 141/52  Pulse: 65 83 82 72  Resp: 20 17 18 18   Temp: 97.6 F (36.4 C) 97.7 F (36.5 C) 98 F (36.7 C) 98.7 F (37.1 C)  TempSrc: Oral Oral Oral Axillary  SpO2: 100% 100% 100% 100%    Intake/Output Summary (Last 24 hours) at 07/04/2019 1147 Last data filed at 07/04/2019 0500 Gross per 24 hour  Intake 1524.52 ml  Output -  Net 1524.52 ml   Last 3 Weights 06/14/2019 06/13/2019 06/12/2019  Weight (lbs) 242 lb 8.1 oz 245 lb 6 oz 244 lb 7.8 oz  Weight (kg) 110 kg 111.3 kg 110.9 kg      Telemetry    Sinus rhythm, generally in the 70s, range 50-70s, PVCs and PACs - Personally Reviewed  ECG    No new tracings - Personally Reviewed  Physical Exam   GEN: No acute distress.   Neck: No JVD Cardiac: RRR Respiratory: respirations unlabored GI: Soft, nontender, non-distended  MS: - edema; No deformity. Neuro:  Nonfocal  Psych: Normal affect   Labs    High Sensitivity Troponin:   Recent Labs  Lab 07/03/19 1121 07/03/19 1252  TROPONINIHS 28* 26*      Cardiac EnzymesNo results for input(s): TROPONINI in the last 168 hours. No results for input(s): TROPIPOC in the last 168 hours.   Chemistry Recent Labs  Lab 07/03/19 1121 07/03/19 1613 07/04/19  0434  NA 134* 135 137  K 2.8* 2.9* 3.5  CL 83*  --  92*  CO2 36*  --  31  GLUCOSE 99  --  132*  BUN 51*  --  41*  CREATININE 2.80*  --  2.12*  CALCIUM 10.3  --  9.3  PROT 7.7  --   --   ALBUMIN 3.9  --   --   AST 31  --   --   ALT 17  --   --   ALKPHOS 79  --   --   BILITOT 0.9  --   --   GFRNONAA 20*  --  27*  GFRAA 23*  --  32*  ANIONGAP 15  --  14     Hematology Recent Labs  Lab 07/03/19 1121 07/03/19 1613 07/04/19 0434  WBC 11.0*  --  8.8  RBC 6.00*  --  5.08  HGB 13.2 11.9* 11.0*  HCT 41.7 35.0* 35.8*  MCV 69.5*  --  70.5*  MCH 22.0*  --  21.7*  MCHC 31.7  --  30.7  RDW 15.4  --  15.0  PLT 244  --  216    BNPNo results for input(s): BNP, PROBNP in the last 168 hours.   DDimer No results for input(s): DDIMER in the  last 168 hours.   Radiology    Ct Head Wo Contrast  Result Date: 07/03/2019 CLINICAL DATA:  Altered mental status. Weakness. EXAM: CT HEAD WITHOUT CONTRAST TECHNIQUE: Contiguous axial images were obtained from the base of the skull through the vertex without intravenous contrast. COMPARISON:  May 22, 2018 FINDINGS: Brain: No evidence of acute infarction, hemorrhage, hydrocephalus, extra-axial collection or mass lesion/mass effect. Moderate brain parenchymal volume loss. Advanced chronic small vessel ischemia of the deep white matter. Stable remote right parietal lobe ischemic infarct. Stable appearance of bilateral cerebellar chronic ischemic infarcts. Vascular: Calcific atherosclerotic disease of the intra cavernous carotid arteries. Skull: Normal. Negative for fracture or focal lesion. Sinuses/Orbits: No acute finding. Other: None. IMPRESSION: 1. No acute intracranial abnormality. 2. Atrophy, chronic microvascular disease. 3. Stable right parietal lobe and bilateral cerebellar chronic ischemic infarcts. Electronically Signed   By: Fidela Salisbury M.D.   On: 07/03/2019 16:14   Dg Chest Port 1 View  Result Date: 07/03/2019 CLINICAL DATA:  Weakness  and shortness of breath EXAM: PORTABLE CHEST 1 VIEW COMPARISON:  06/02/2019 and prior radiographs FINDINGS: Cardiomegaly noted. LEFT LOWER lung atelectasis and possible small LEFT pleural effusion again noted. No evidence of pneumothorax. No acute bony abnormality identified. IMPRESSION: Unchanged appearance of the chest with LEFT LOWER lung atelectasis and possible small LEFT pleural effusion. Electronically Signed   By: Margarette Canada M.D.   On: 07/03/2019 11:54    Cardiac Studies   Echo 12/06/16: Study Conclusions - Left ventricle: The cavity size was normal. Wall thickness was   increased in a pattern of moderate LVH. Systolic function was   normal. The estimated ejection fraction was in the range of 55%   to 60%. Although no diagnostic regional wall motion abnormality   was identified, this possibility cannot be completely excluded on   the basis of this study. Doppler parameters are consistent with   abnormal left ventricular relaxation (grade 1 diastolic   dysfunction). - Aortic valve: Trileaflet; moderately calcified leaflets.   Sclerosis without stenosis. - Mitral valve: There was no significant regurgitation. - Right ventricle: The cavity size was normal. Systolic function   was normal. - Pulmonary arteries: No complete TR doppler jet so unable to   estimate PA systolic pressure. - Systemic veins: IVC measured 2.2 cm with > 50% respirophasic   variation, suggesting RA pressure 8 mmHg.  Impressions: - Normal LV size with moderate LV hypertrophy. EF 55-60%. Normal RV   size and systolic function. Aortic sclerosis without significant   stenosis.  Patient Profile     85 y.o. male with a history of normal coronaries on cardiac catheterization in 7893, chronic diastolic CHF, hypertension, hyperlipidemia, type 2 diabetes mellitus, prior stroke on Aspirin and Plavix, alpha thalassemia who is being seen today for the evaluation of bradycardia.   Assessment & Plan    1. Bradycardia  2. PVCs, PACs 3. 1st degree AV block - seems to have resolved on telemetry - generally in the 50-70s - K 3.5 - will hold off on supplementation given renal function, has been given IV by primary - Mg 1.6 - 2 g given by primary, ordered another 2 g, recheck Mg in AM - holding home lopressor and AV nodal blocking agents for now - echocardiogram pending   4. Chest pain - hs troponin 28 --> 26 - EKG with IVCD, first degree AVB, anterior and inferior Q waves - not consistent with ACS process   5. Chronic diastolic heart failure - does not  appear extremely volume overloaded - will hold diuretic for now given renal function   6. Renal insufficiency - sCr 2.12 - improved from 2.80 on admission - baseline appears to be 1.2-1.3 - may be secondary to bradycardia   For questions or updates, please contact Roy Please consult [email or web address] for contact info under        Signed, Ledora Bottcher, PA  07/04/2019, 11:47 AM     Patient seen and examined. Agree with assessment and plan. No chest pain. No further bradycardia. Off  BB and diuretics on hold; CO2 improved 36 >31 and Cr. 2.8 > 2.12.  For 2 d echo today.   Troy Sine, MD, [facility] 07/04/2019 12:53 PM

## 2019-07-05 ENCOUNTER — Encounter (HOSPITAL_COMMUNITY): Payer: Self-pay | Admitting: *Deleted

## 2019-07-05 DIAGNOSIS — B9620 Unspecified Escherichia coli [E. coli] as the cause of diseases classified elsewhere: Secondary | ICD-10-CM

## 2019-07-05 DIAGNOSIS — N182 Chronic kidney disease, stage 2 (mild): Secondary | ICD-10-CM

## 2019-07-05 DIAGNOSIS — N390 Urinary tract infection, site not specified: Principal | ICD-10-CM

## 2019-07-05 DIAGNOSIS — N170 Acute kidney failure with tubular necrosis: Secondary | ICD-10-CM

## 2019-07-05 LAB — CBC WITH DIFFERENTIAL/PLATELET
Abs Immature Granulocytes: 0.02 10*3/uL (ref 0.00–0.07)
Basophils Absolute: 0 10*3/uL (ref 0.0–0.1)
Basophils Relative: 0 %
Eosinophils Absolute: 0.1 10*3/uL (ref 0.0–0.5)
Eosinophils Relative: 1 %
HCT: 37.3 % — ABNORMAL LOW (ref 39.0–52.0)
Hemoglobin: 11.2 g/dL — ABNORMAL LOW (ref 13.0–17.0)
Immature Granulocytes: 0 %
Lymphocytes Relative: 12 %
Lymphs Abs: 0.9 10*3/uL (ref 0.7–4.0)
MCH: 21.2 pg — ABNORMAL LOW (ref 26.0–34.0)
MCHC: 30 g/dL (ref 30.0–36.0)
MCV: 70.6 fL — ABNORMAL LOW (ref 80.0–100.0)
Monocytes Absolute: 0.9 10*3/uL (ref 0.1–1.0)
Monocytes Relative: 12 %
Neutro Abs: 5.8 10*3/uL (ref 1.7–7.7)
Neutrophils Relative %: 75 %
Platelets: 200 10*3/uL (ref 150–400)
RBC: 5.28 MIL/uL (ref 4.22–5.81)
RDW: 15.2 % (ref 11.5–15.5)
WBC: 7.8 10*3/uL (ref 4.0–10.5)
nRBC: 0 % (ref 0.0–0.2)

## 2019-07-05 LAB — URINE CULTURE: Culture: <10000 — AB

## 2019-07-05 LAB — COMPREHENSIVE METABOLIC PANEL
ALT: 11 U/L (ref 0–44)
AST: 22 U/L (ref 15–41)
Albumin: 2.8 g/dL — ABNORMAL LOW (ref 3.5–5.0)
Alkaline Phosphatase: 58 U/L (ref 38–126)
Anion gap: 14 (ref 5–15)
BUN: 26 mg/dL — ABNORMAL HIGH (ref 8–23)
CO2: 32 mmol/L (ref 22–32)
Calcium: 9.4 mg/dL (ref 8.9–10.3)
Chloride: 95 mmol/L — ABNORMAL LOW (ref 98–111)
Creatinine, Ser: 1.59 mg/dL — ABNORMAL HIGH (ref 0.61–1.24)
GFR calc Af Amer: 45 mL/min — ABNORMAL LOW (ref >60)
GFR calc non Af Amer: 39 mL/min — ABNORMAL LOW (ref >60)
Glucose, Bld: 133 mg/dL — ABNORMAL HIGH (ref 70–99)
Potassium: 3 mmol/L — ABNORMAL LOW (ref 3.5–5.1)
Sodium: 141 mmol/L (ref 135–145)
Total Bilirubin: 0.9 mg/dL (ref 0.3–1.2)
Total Protein: 6.1 g/dL — ABNORMAL LOW (ref 6.5–8.1)

## 2019-07-05 LAB — GLUCOSE, CAPILLARY
Glucose-Capillary: 141 mg/dL — ABNORMAL HIGH (ref 70–99)
Glucose-Capillary: 153 mg/dL — ABNORMAL HIGH (ref 70–99)
Glucose-Capillary: 170 mg/dL — ABNORMAL HIGH (ref 70–99)
Glucose-Capillary: 177 mg/dL — ABNORMAL HIGH (ref 70–99)
Glucose-Capillary: 176 mg/dL — ABNORMAL HIGH (ref 70–99)

## 2019-07-05 LAB — UREA NITROGEN, URINE: Urea Nitrogen, Ur: 324 mg/dL

## 2019-07-05 LAB — MAGNESIUM: Magnesium: 2.1 mg/dL (ref 1.7–2.4)

## 2019-07-05 LAB — PHOSPHORUS: Phosphorus: 1.7 mg/dL — ABNORMAL LOW (ref 2.5–4.6)

## 2019-07-05 MED ORDER — POTASSIUM CHLORIDE 10 MEQ/100ML IV SOLN
10.0000 meq | INTRAVENOUS | Status: AC
  Administered 2019-07-05 (×4): 10 meq via INTRAVENOUS
  Filled 2019-07-05 (×4): qty 100

## 2019-07-05 MED ORDER — POTASSIUM PHOSPHATES 15 MMOLE/5ML IV SOLN
20.0000 mmol | Freq: Once | INTRAVENOUS | Status: AC
  Administered 2019-07-05: 20 mmol via INTRAVENOUS
  Filled 2019-07-05: qty 6.67

## 2019-07-05 NOTE — Evaluation (Signed)
Physical Therapy Evaluation Patient Details Name: Jared Hall MRN: [id number] DOB: 17-Jul-1931 Today's Date: 07/05/2019   History of Present Illness  85 y.o. male with medical history significant of hypertension, hyperlipidemia, diabetes mellitus type 2, history of stroke, paroxysmal atrial fibrillation not on anticoagulation, OSA, and alpha thalassemia.  He presented from [facility] initially with reported complaints of chest pain.  Reports note that chest pain resolved prior to EMS arrival.  History is limited however as patient found to be altered and hard to arouse.    Clinical Impression  Pt admitted with above diagnosis. On eval, pt required +2 mod/max assist bed mobility and +2 max assist squat pivot transfer. Pt very lethargic at time of eval. Following simple commands inconsistently. Little to no communication. Pt currently with functional limitations due to the deficits listed below (see PT Problem List). Pt will benefit from skilled PT to increase their independence and safety with mobility to allow discharge to the venue listed below.       Follow Up Recommendations SNF    Equipment Recommendations  None recommended by PT    Recommendations for Other Services       Precautions / Restrictions Precautions Precautions: Fall      Mobility  Bed Mobility Overal bed mobility: Needs Assistance Bed Mobility: Supine to Sit     Supine to sit: +2 for physical assistance;Mod assist;Max assist;HOB elevated     General bed mobility comments: assist with BLE out of bed and to elevate trunk. Use of bed pad to scoot to EOB.  Transfers Overall transfer level: Needs assistance Equipment used: None Transfers: Squat Pivot Transfers     Squat pivot transfers: Max assist;+2 physical assistance     General transfer comment: pivot transfer toward left. Bilat knees blocked due to buckling.  Ambulation/Gait             General Gait Details: non-ambulatory  Stairs             Wheelchair Mobility    Modified Rankin (Stroke Patients Only)       Balance Overall balance assessment: Needs assistance Sitting-balance support: Feet supported;Bilateral upper extremity supported Sitting balance-Leahy Scale: Fair Sitting balance - Comments: min guard sitting EOB with BUE support                                     Pertinent Vitals/Pain Pain Assessment: Faces Faces Pain Scale: Hurts a little bit Pain Location: shakes his head yes to "are you hurting?" but unable to tell where Pain Descriptors / Indicators: Grimacing Pain Intervention(s): Limited activity within patient's tolerance;Monitored during session;Repositioned    Home Living Family/patient expects to be discharged to:: Assisted living               Home Equipment: Walker - 4 wheels;Walker - 2 wheels;Wheelchair - manual Additional CommentsEngineer, building services / Transfers Assistance Needed: Per notes from 2019, pt transfered self to w/c and self propeled. Non-ambulatory x 6 years. July 2020 admission pt required +2 mod/max assist transfers.  ADL's / Homemaking Assistance Needed: Unsure of assist level needed for ADLs at ALF  Comments: Pt unable to answer questions regarding PLOF. SNF was recommended during July 2020 admission. Pt refused and d/c'd back to [facility]. Per notes, at time of d/c HH not permitted at [facility] due to covid.  Hand Dominance   Dominant Hand: Right    Extremity/Trunk Assessment   Upper Extremity Assessment Upper Extremity Assessment: Defer to OT evaluation    Lower Extremity Assessment Lower Extremity Assessment: Generalized weakness       Communication   Communication: Expressive difficulties;HOH  Cognition Arousal/Alertness: Lethargic Behavior During Therapy: Flat affect Overall Cognitive Status: Difficult to assess                                        General  Comments General comments (skin integrity, edema, etc.): Spoke with RN, recommending bariatric stedy for return to bed.    Exercises     Assessment/Plan    PT Assessment Patient needs continued PT services  PT Problem List Decreased strength;Decreased activity tolerance;Decreased balance;Decreased mobility;Decreased coordination;Decreased cognition;Pain       PT Treatment Interventions DME instruction;Functional mobility training;Therapeutic activities;Therapeutic exercise;Balance training;Neuromuscular re-education    PT Goals (Current goals can be found in the Care Plan section)  Acute Rehab PT Goals Patient Stated Goal: Pt was agreeable to sit up in chair PT Goal Formulation: Patient unable to participate in goal setting Time For Goal Achievement: 07/19/19 Potential to Achieve Goals: Fair    Frequency Min 2X/week   Barriers to discharge        Co-evaluation PT/OT/SLP Co-Evaluation/Treatment: Yes Reason for Co-Treatment: For patient/therapist safety;Complexity of the patient's impairments (multi-system involvement);Necessary to address cognition/behavior during functional activity PT goals addressed during session: Mobility/safety with mobility;Balance         AM-PAC PT "6 Clicks" Mobility  Outcome Measure Help needed turning from your back to your side while in a flat bed without using bedrails?: A Lot Help needed moving from lying on your back to sitting on the side of a flat bed without using bedrails?: A Lot Help needed moving to and from a bed to a chair (including a wheelchair)?: A Lot Help needed standing up from a chair using your arms (e.g., wheelchair or bedside chair)?: A Lot Help needed to walk in hospital room?: Total Help needed climbing 3-5 steps with a railing? : Total 6 Click Score: 10    End of Session Equipment Utilized During Treatment: Gait belt;Oxygen Activity Tolerance: Patient limited by lethargy Patient left: in chair;with chair alarm  set;with call bell/phone within reach Nurse Communication: Mobility status;Need for lift equipment PT Visit Diagnosis: Muscle weakness (generalized) (M62.81);Other abnormalities of gait and mobility (R26.89)    Time: 7672-0947 PT Time Calculation (min) (ACUTE ONLY): 20 min   Charges:   PT Evaluation $PT Eval Low Complexity: 1 Low $PT Eval Moderate Complexity: 1 Mod          Lorrin Goodell, PT  Office # [phone] Pager [phone]   [name] 07/05/2019, 8:49 AM

## 2019-07-05 NOTE — Evaluation (Signed)
Occupational Therapy Evaluation Patient Details Name: Jared Hall MRN: [id number] DOB: 1931/06/28 Today's Date: 07/05/2019    History of Present Illness 85 y.o. male with medical history significant of hypertension, hyperlipidemia, diabetes mellitus type 2, history of stroke, paroxysmal atrial fibrillation not on anticoagulation, OSA, and alpha thalassemia.  He presented from [facility] initially with reported complaints of chest pain.  Reports note that chest pain resolved prior to EMS arrival.  History is limited however as patient found to be altered and hard to arouse.   Clinical Impression   Pt PTA: from [facility]. Pt not very communicative and had to ask questions several times to receive a nod or a shake of the head. Pt modA/maxA +2 for bed mobility; maxA+2 for squat pivot transfer to recliner. Staff advised to use stedy. Pt maxA overall for ADL. Set-upA for washing face. Pt tolerates unsupported sitting EOB x5 mins with intermittent assist to reduce lean prior to transfer. Pt would greatly benefit from continued OT skilled services for ADL, mobility and safety in SNF setting. OT following acutely.     Follow Up Recommendations  SNF;Supervision/Assistance - 24 hour    Equipment Recommendations  None recommended by OT    Recommendations for Other Services       Precautions / Restrictions Precautions Precautions: Fall Restrictions Weight Bearing Restrictions: No      Mobility Bed Mobility Overal bed mobility: Needs Assistance Bed Mobility: Supine to Sit     Supine to sit: +2 for physical assistance;Mod assist;Max assist;HOB elevated     General bed mobility comments: assist with BLE out of bed and to elevate trunk. Use of bed pad to scoot to EOB.  Transfers Overall transfer level: Needs assistance Equipment used: None Transfers: Squat Pivot Transfers     Squat pivot transfers: Max assist;+2 physical assistance     General transfer comment: pivot  transfer toward left. Bilat knees blocked due to buckling.    Balance Overall balance assessment: Needs assistance Sitting-balance support: Feet supported;Bilateral upper extremity supported Sitting balance-Leahy Scale: Fair Sitting balance - Comments: min guard sitting EOB with BUE support                                   ADL either performed or assessed with clinical judgement   ADL Overall ADL's : Needs assistance/impaired Eating/Feeding: Minimal assistance;Sitting   Grooming: Minimal assistance;Sitting   Upper Body Bathing: Minimal assistance;Sitting   Lower Body Bathing: Maximal assistance;Sitting/lateral leans;Sit to/from stand   Upper Body Dressing : Minimal assistance;Sitting   Lower Body Dressing: Maximal assistance;Sitting/lateral leans;Sit to/from stand   Toilet Transfer: Moderate assistance;Maximal assistance;+2 for physical assistance;+2 for safety/equipment;Cueing for safety;Cueing for sequencing;BSC   Toileting- Clothing Manipulation and Hygiene: Total assistance;+2 for physical assistance;+2 for safety/equipment       Functional mobility during ADLs: Moderate assistance;Maximal assistance;+2 for physical assistance;+2 for safety/equipment(squat pivot) General ADL Comments: Pt not very communicative at this time.     Vision Baseline Vision/History: No visual deficits Vision Assessment?: No apparent visual deficits     Perception     Praxis      Pertinent Vitals/Pain Pain Assessment: Faces Faces Pain Scale: Hurts a little bit Pain Location: (generalized) Pain Descriptors / Indicators: Grimacing Pain Intervention(s): Limited activity within patient's tolerance     Hand Dominance Right   Extremity/Trunk Assessment Upper Extremity Assessment Upper Extremity Assessment: Generalized weakness   Lower Extremity Assessment Lower Extremity  Assessment: Generalized weakness   Cervical / Trunk Assessment Cervical / Trunk Assessment:  Kyphotic   Communication Communication Communication: Expressive difficulties;HOH   Cognition Arousal/Alertness: Lethargic Behavior During Therapy: Flat affect Overall Cognitive Status: Difficult to assess                                 General Comments: perseverating on going back to [facility] Comments  relayed info: stedy for transfer to recliner for staff    Exercises     Shoulder Instructions      Home Living Family/patient expects to be discharged to:: Skilled nursing facility                             Home Equipment: Gilford Rile - 4 wheels;Walker - 2 wheels;Wheelchair - manual          Prior Functioning/Environment Level of Independence: Needs assistance  Gait / Transfers Assistance Needed: Per notes from 2019, pt transfered self to w/c and self propeled. Non-ambulatory x 6 years. July 2020 admission pt required +2 mod/max assist transfers. ADL's / Homemaking Assistance Needed: Unsure of assist level needed for ADLs at ALF            OT Problem List: Decreased strength;Decreased activity tolerance;Pain;Decreased knowledge of use of DME or AE;Impaired balance (sitting and/or standing);Decreased cognition      OT Treatment/Interventions: Self-care/ADL training;DME and/or AE instruction;Therapeutic exercise;Therapeutic activities;Patient/family education;Balance training    OT Goals(Current goals can be found in the care plan section) Acute Rehab OT Goals Patient Stated Goal: Pt was agreeable to sit up in chair OT Goal Formulation: With patient Time For Goal Achievement: 07/19/19 Potential to Achieve Goals: Good ADL Goals Pt Will Perform Grooming: with set-up;sitting Pt Will Perform Upper Body Dressing: with supervision;sitting Pt Will Perform Lower Body Dressing: with mod assist;sitting/lateral leans;sit to/from stand Pt Will Transfer to Toilet: with mod assist;stand pivot transfer;bedside commode Additional ADL Goal #1:  Pt will sit upright with good dynamic sitting balance for ADL x10 mins  OT Frequency: Min 2X/week   Barriers to D/C:            Co-evaluation PT/OT/SLP Co-Evaluation/Treatment: Yes Reason for Co-Treatment: Complexity of the patient's impairments (multi-system involvement)   OT goals addressed during session: ADL's and self-care      AM-PAC OT "6 Clicks" Daily Activity     Outcome Measure Help from another person eating meals?: A Little Help from another person taking care of personal grooming?: A Little Help from another person toileting, which includes using toliet, bedpan, or urinal?: Total Help from another person bathing (including washing, rinsing, drying)?: A Lot Help from another person to put on and taking off regular upper body clothing?: A Lot Help from another person to put on and taking off regular lower body clothing?: Total 6 Click Score: 12   End of Session Equipment Utilized During Treatment: Gait belt Nurse Communication: Mobility status;Need for lift equipment  Activity Tolerance: Patient tolerated treatment well Patient left: in chair;with call bell/phone within reach;with chair alarm set  OT Visit Diagnosis: Muscle weakness (generalized) (M62.81);Unsteadiness on feet (R26.81)                Time: 3500-9381 OT Time Calculation (min): 16 min Charges:  OT General Charges $OT Visit: 1 Visit OT Evaluation $OT Eval Moderate Complexity: 1 Mod  Channel Papandrea (Jelenek) Marsa Aris OTR/L Acute Rehabilitation  Services Pager: [phone] Office: [place] 07/05/2019, 2:52 PM

## 2019-07-05 NOTE — TOC Progression Note (Addendum)
Transition of Care [facility]) - Progression Note    Patient Details  Name: Jared Hall MRN: [id number] Date of Birth: 08-08-1931  Transition of Care [facility]) CM/SW [place], [place] Phone Number: 07/05/2019, 11:54 AM  Clinical Narrative:     CSW called and spoke with [facility] facility. They are not able to provide rehab to the patient. They instructed that he go to SNF before he is able to return.   CSW called and spoke with the patient's daughter-in-law, Andersen Iorio. [name] stated that her husband was not able to talk at the moment but she could speak with the CSW. She stated that she and her husband both assist with the patient's care. CSW stated that therapy had recommended that the patient go to rehab before returning back to his ALF. CSW shared that the ALF would not allow him to come back without receiving rehab. She stated that the patient has been to [facility] before but she did not want him to go back to there being [place]. CSW offered to fax him out to non-COVID facilities and provide bed offers. She stated that she is agreeable to that and thanked the CSW.   CSW provided CMS SNF List via email. CSW obtained permission to fax the patient out. CSW will provide bed offers.   Update: Family would prefer [facility], [facility], [facility], and [facility].   Expected Discharge Plan: [place] Barriers to Discharge: Continued Medical Work up  Expected Discharge Plan and Services Expected Discharge Plan: [place] In-house Referral: Clinical Social Work Discharge Planning Services: NA Post Acute Care Choice: [place] Living arrangements for the past 2 months: [place]                 DME Arranged: N/A DME Agency: NA       HH Arranged: NA [place] Agency: NA         Social Determinants of Health (SDOH) Interventions    Readmission Risk Interventions Readmission Risk Prevention Plan  06/09/2019  Transportation Screening Complete  PCP or Specialist Appt within 3-5 Days Not Complete  Not Complete comments not ready for d/c, plan to go to SNF  [place] or [place] Complete  Social Work Consult for [place] Planning/Counseling Complete  Palliative Care Screening Not Applicable  Medication Review Press photographer) Complete  Some recent data might be hidden

## 2019-07-05 NOTE — [place] FL2 (Signed)
[place] MEDICAID FL2 LEVEL OF CARE SCREENING TOOL     IDENTIFICATION  Patient Name: Jared Hall Birthdate: [birth year] Sex: male Admission Date (Current Location): 07/03/2019  [facility] and [place] Number:  Herbalist and Address:  The . [facility], [place] [address] [place], [place] [address]      Provider Number: [id number]  Attending Physician Name and Address:  Kerney Elbe, DO  Relative Name and Phone Number:  [name] & Reyhan Moronta, [name] Leiter and Daughter-in-law, [phone]    Current Level of Care: Hospital Recommended Level of Care: [place] Prior Approval Number:    Date Approved/Denied:   PASRR Number: [id number]  Discharge Plan: SNF   Current Diagnoses: Patient Active Problem List   Diagnosis Date Noted  . Leukocytosis 07/03/2019  . Acute renal failure superimposed on chronic kidney disease ([place]) 07/03/2019  . Chest pain 07/03/2019  . Urinary tract infection 07/03/2019  . Hypokalemia 06/02/2019  . TIA (transient ischemic attack) 05/22/2018  . Cellulitis in diabetic foot ([place]) 05/22/2018  . Tremor 10/17/2017  . Acute encephalopathy   . Acute ischemic stroke ([place])   . Generalized weakness   . Cerebrovascular disease   . Altered mental status   . Acute respiratory failure with hypercapnia ([place])   . CVA (cerebral vascular accident) ([place]) 01/01/2017  . Dehydration 01/01/2017  . Anemia 01/01/2017  . Acute respiratory failure ([place]) 12/15/2016  . Morbid obesity ([place]) 12/15/2016  . Hard of hearing 12/15/2016  . Chronic kidney disease, stage III (moderate) ([place]) 12/06/2016  . Bell's palsy   . Facial droop   . Paroxysmal atrial fibrillation (HCC)   . Embolic stroke ([place])   . Dysphagia 03/14/2015  . Dysarthria 03/14/2015  . Stroke ([place]) 03/14/2015  . Cellulitis 01/11/2014  . Bilateral lower extremity edema 01/11/2014  . Venous stasis dermatitis 01/11/2014  . DM type 2 with diabetic peripheral neuropathy ([place])  01/11/2014  . Cellulitis and abscess of leg 01/11/2014  . Cellulitis and abscess 01/11/2014  . Depression 06/15/2013  . GERD (gastroesophageal reflux disease) 06/15/2013  . Constipation 06/15/2013  . Diabetic neuropathy ([place]) 06/15/2013  . Type 1 diabetes mellitus with peripheral circulatory complications ([place]) 42/35/3614  . Essential hypertension, benign 04/07/2013  . AKI (acute kidney injury) ([place]) 04/04/2013  . Bilateral lower leg cellulitis 04/04/2013  . Right hip pain 04/04/2013  . Renal insufficiency 12/16/2011  . History of CVA (cerebrovascular accident) 12/16/2011  . Dyslipidemia 12/16/2011  . Obesity (BMI 30-39.9) 12/16/2011  . Umbilical hernia with obstruction-partial on CT scan; easily reducible 12/14/2011  . History of PSVT (paroxysmal supraventricular tachycardia) 12/14/2011  . DM (diabetes mellitus) ([place]) 12/14/2011    Orientation RESPIRATION BLADDER Height & Weight     (Disorientated x4)  O2(100, [place], 2L) Incontinent Weight:   Height:     BEHAVIORAL SYMPTOMS/MOOD NEUROLOGICAL BOWEL NUTRITION STATUS      Incontinent Diet(NPO)  AMBULATORY STATUS COMMUNICATION OF NEEDS Skin   Extensive Assist Verbally Other (Comment)(Cellulitis on leg, MASD on abdomen)                       Personal Care Assistance Level of Assistance  Bathing, Feeding, Dressing, Total care Bathing Assistance: Maximum assistance Feeding assistance: Limited assistance Dressing Assistance: Maximum assistance Total Care Assistance: Maximum assistance   Functional Limitations Info  Sight, Hearing, Speech Sight Info: Adequate Hearing Info: Adequate Speech Info: Adequate    SPECIAL CARE FACTORS FREQUENCY  PT (By licensed PT), OT (By licensed  OT)     PT Frequency: 5x/wk OT Frequency: 5x/wk     Speech Therapy Frequency: 2x/per week      Contractures Contractures Info: Not present    Additional Factors Info  Code Status, Allergies Code Status Info: DNR Allergies Info: Ativan  (Lorazepam), Flomax (Tamsulosin Hcl), Glucophage (Metformin Hcl)           Current Medications (07/05/2019):  This is the current hospital active medication list Current Facility-Administered Medications  Medication Dose Route Frequency Provider Last Rate Last Dose  . 0.9 %  sodium chloride infusion   Intravenous Continuous Fuller Plan A, MD 75 mL/hr at 07/04/19 2241    . 0.9 %  sodium chloride infusion   Intravenous PRN Fuller Plan A, MD 10 mL/hr at 07/04/19 0500    . acetaminophen (TYLENOL) tablet 650 mg  650 mg Oral Q6H PRN Fuller Plan A, MD       Or  . acetaminophen (TYLENOL) suppository 650 mg  650 mg Rectal Q6H PRN Fuller Plan A, MD      . antiseptic oral rinse (BIOTENE) solution 15 mL  15 mL Mouth Rinse TID Raiford Noble Latif, DO   15 mL at 07/05/19 1002  . cefTRIAXone (ROCEPHIN) 1 g in sodium chloride 0.9 % 100 mL IVPB  1 g Intravenous Q24H Smith, Rondell A, MD 200 mL/hr at 07/04/19 1610 1 g at 07/04/19 1610  . heparin injection 5,000 Units  5,000 Units Subcutaneous Q8H Fuller Plan A, MD   5,000 Units at 07/05/19 [phone]  . ondansetron (ZOFRAN) tablet 4 mg  4 mg Oral Q6H PRN Fuller Plan A, MD       Or  . ondansetron (ZOFRAN) injection 4 mg  4 mg Intravenous Q6H PRN Smith, Rondell A, MD      . potassium chloride 10 mEq in 100 mL IVPB  10 mEq Intravenous Q1 Hr x 4 SheikhGeorgina Quint [place], DO 100 mL/hr at 07/05/19 1122 10 mEq at 07/05/19 1122  . potassium PHOSPHATE 20 mmol in dextrose 5 % 500 mL infusion  20 mmol Intravenous Once Raiford Noble Latif, DO 84 mL/hr at 07/05/19 1017 20 mmol at 07/05/19 1017  . sodium chloride flush (NS) 0.9 % injection 3 mL  3 mL Intravenous Q12H Smith, Rondell A, MD   3 mL at 07/05/19 1003     Discharge Medications: Please see discharge summary for a list of discharge medications.  Relevant Imaging Results:  Relevant Lab Results:   Additional Information SSN: [id number]  Philippa Chester Jamauri Kruzel, LCSWA

## 2019-07-05 NOTE — Progress Notes (Signed)
Progress Note  Patient Name: Jared Hall Date of Encounter: 07/05/2019  Primary Cardiologist: Dr. Radford Pax   Subjective   No complaints. +1.4L recorded. Weight not accurate. Echo yesterday shows LVEF 60-65%.  Inpatient Medications    Scheduled Meds:  antiseptic oral rinse  15 mL Mouth Rinse TID   heparin  5,000 Units Subcutaneous Q8H   sodium chloride flush  3 mL Intravenous Q12H   Continuous Infusions:  sodium chloride 75 mL/hr at 07/04/19 2241   sodium chloride 10 mL/hr at 07/04/19 0500   cefTRIAXone (ROCEPHIN)  IV 1 g (07/04/19 1610)   potassium chloride 10 mEq (07/05/19 1458)   potassium PHOSPHATE IVPB (in mmol) 20 mmol (07/05/19 1017)   PRN Meds: sodium chloride, acetaminophen **OR** acetaminophen, ondansetron **OR** ondansetron (ZOFRAN) IV   Vital Signs    Vitals:   07/05/19 0352 07/05/19 0700 07/05/19 1100 07/05/19 1200  BP: (!) 140/59 (!) 145/55 (!) 140/112 (!) 147/56  Pulse: 80 86 94 75  Resp: 17 17 17    Temp: 97.8 F (36.6 C) 98.6 F (37 C) 98.5 F (36.9 C)   TempSrc: Oral Oral Oral   SpO2: 94% 100% 100%     Intake/Output Summary (Last 24 hours) at 07/05/2019 1501 Last data filed at 07/05/2019 0500 Gross per 24 hour  Intake 1405.62 ml  Output --  Net 1405.62 ml   Last 3 Weights 06/14/2019 06/13/2019 06/12/2019  Weight (lbs) 242 lb 8.1 oz 245 lb 6 oz 244 lb 7.8 oz  Weight (kg) 110 kg 111.3 kg 110.9 kg      Telemetry    Sinus rhythm with ectopy- Personally Reviewed  ECG    No new tracings - Personally Reviewed  Physical Exam   General appearance: alert and no distress Lungs: clear to auscultation bilaterally Heart: regular rate and rhythm, S1, S2 normal, no murmur, click, rub or gallop Extremities: extremities normal, atraumatic, no cyanosis or edema Neurologic: Grossly normal   Labs    High Sensitivity Troponin:   Recent Labs  Lab 07/03/19 1121 07/03/19 1252  TROPONINIHS 28* 26*      Cardiac EnzymesNo results for input(s):  TROPONINI in the last 168 hours. No results for input(s): TROPIPOC in the last 168 hours.   Chemistry Recent Labs  Lab 07/03/19 1121 07/03/19 1613 07/04/19 0434 07/05/19 0329  NA 134* 135 137 141  K 2.8* 2.9* 3.5 3.0*  CL 83*  --  92* 95*  CO2 36*  --  31 32  GLUCOSE 99  --  132* 133*  BUN 51*  --  41* 26*  CREATININE 2.80*  --  2.12* 1.59*  CALCIUM 10.3  --  9.3 9.4  PROT 7.7  --   --  6.1*  ALBUMIN 3.9  --   --  2.8*  AST 31  --   --  22  ALT 17  --   --  11  ALKPHOS 79  --   --  58  BILITOT 0.9  --   --  0.9  GFRNONAA 20*  --  27* 39*  GFRAA 23*  --  32* 45*  ANIONGAP 15  --  14 14     Hematology Recent Labs  Lab 07/03/19 1121 07/03/19 1613 07/04/19 0434 07/05/19 0329  WBC 11.0*  --  8.8 7.8  RBC 6.00*  --  5.08 5.28  HGB 13.2 11.9* 11.0* 11.2*  HCT 41.7 35.0* 35.8* 37.3*  MCV 69.5*  --  70.5* 70.6*  MCH 22.0*  --  21.7* 21.2*  MCHC 31.7  --  30.7 30.0  RDW 15.4  --  15.0 15.2  PLT 244  --  216 200    BNPNo results for input(s): BNP, PROBNP in the last 168 hours.   DDimer No results for input(s): DDIMER in the last 168 hours.   Radiology    Ct Head Wo Contrast  Result Date: 07/03/2019 CLINICAL DATA:  Altered mental status. Weakness. EXAM: CT HEAD WITHOUT CONTRAST TECHNIQUE: Contiguous axial images were obtained from the base of the skull through the vertex without intravenous contrast. COMPARISON:  May 22, 2018 FINDINGS: Brain: No evidence of acute infarction, hemorrhage, hydrocephalus, extra-axial collection or mass lesion/mass effect. Moderate brain parenchymal volume loss. Advanced chronic small vessel ischemia of the deep white matter. Stable remote right parietal lobe ischemic infarct. Stable appearance of bilateral cerebellar chronic ischemic infarcts. Vascular: Calcific atherosclerotic disease of the intra cavernous carotid arteries. Skull: Normal. Negative for fracture or focal lesion. Sinuses/Orbits: No acute finding. Other: None. IMPRESSION: 1. No  acute intracranial abnormality. 2. Atrophy, chronic microvascular disease. 3. Stable right parietal lobe and bilateral cerebellar chronic ischemic infarcts. Electronically Signed   By: Fidela Salisbury M.D.   On: 07/03/2019 16:14    Cardiac Studies   Echo 12/06/16: Study Conclusions - Left ventricle: The cavity size was normal. Wall thickness was   increased in a pattern of moderate LVH. Systolic function was   normal. The estimated ejection fraction was in the range of 55%   to 60%. Although no diagnostic regional wall motion abnormality   was identified, this possibility cannot be completely excluded on   the basis of this study. Doppler parameters are consistent with   abnormal left ventricular relaxation (grade 1 diastolic   dysfunction). - Aortic valve: Trileaflet; moderately calcified leaflets.   Sclerosis without stenosis. - Mitral valve: There was no significant regurgitation. - Right ventricle: The cavity size was normal. Systolic function   was normal. - Pulmonary arteries: No complete TR doppler jet so unable to   estimate PA systolic pressure. - Systemic veins: IVC measured 2.2 cm with > 50% respirophasic   variation, suggesting RA pressure 8 mmHg.  Impressions: - Normal LV size with moderate LV hypertrophy. EF 55-60%. Normal RV   size and systolic function. Aortic sclerosis without significant   stenosis.  Patient Profile     85 y.o. male with a history of normal coronaries on cardiac catheterization in 6073, chronic diastolic CHF, hypertension, hyperlipidemia, type 2 diabetes mellitus, prior stroke on Aspirin and Plavix, alpha thalassemia who is being seen today for the evaluation of bradycardia.   Assessment & Plan    1. Bradycardia - improved 2. PVCs, PACs 3. 1st degree AV block - seems to have resolved on telemetry - generally in the 50-70s - K 3.0 today - replete  - Mg 1.6 - 2 g given by primary, ordered another 2 g, recheck Mg in AM - holding home  lopressor and AV nodal blocking agents for now - echocardiogram pending   4. Chest pain - hs troponin 28 --> 26 - EKG with IVCD, first degree AVB, anterior and inferior Q waves - not consistent with ACS process   5. Chronic diastolic heart failure - does not appear extremely volume overloaded - will hold diuretic for now given renal function - LVEF normal on echo.   6. Renal insufficiency - sCr 2.12 - >1.59 today - improved from 2.80 on admission with hydration - baseline appears to be 1.2-1.3  [facility] will sign off.    Medication Recommendations:  Hold BB due to bradycardia Other recommendations (labs, testing, etc):  none Follow up as an outpatient:  Dr. Radford Pax   For questions or updates, please contact [facility] Please consult [email or web address] for contact info under   Pixie Casino, MD, FACC, [place] Director of the [facility] Diplomate of the American Board of Clinical Lipidology Attending Cardiologist  Direct Dial: [phone]   Fax: [phone]  Website:  [email or web address]  Pixie Casino, MD  07/05/2019, 3:01 PM

## 2019-07-05 NOTE — Progress Notes (Signed)
PROGRESS NOTE    LANGLEY INGALLS  PNT:[id number] DOB: 1930/12/20 DOA: 07/03/2019 PCP: Josetta Huddle, MD  Brief Narrative:  HPI per Dr. Fuller Plan on 07/03/2019 Jared Hall is a 85 y.o. male with medical history significant of hypertension, hyperlipidemia, diabetes mellitus type 2, history of stroke, paroxysmal atrial fibrillation not on anticoagulation, OSA, and alpha thalassemia; who presented from the nursing facility initially with reported complaints of chest pain.  Reports note that chest pain resolved prior to EMS arrival.  History is limited however as patient found to be altered and hard to arouse currently.  Cannot get him to answer questions at this time.   ED Course: Upon admission into the emergency department patient was noted to be afebrile, pulse 73-88, respirations 15-25, blood pressures 142 138/103, and O2 saturations maintained on room air.  Labs revealed WBC 11, sodium 134, potassium 2.8, chloride 83, CO2 36, BUN 55, and creatinine 2.8.  COVID-19 testing negative. Urinalysis positive for small hemoglobin, large leukocytes, many bacteria, and greater than 50 WBCs.  Patient was given 500 mL of normal saline, 40 mEq of potassium chloride IV, and Rocephin.  Nursing staff while in the ED noted heart rates going down into the 30s with appearance of first-degree heart block.  **Interim History  Patient remained drowsy and encephalopathic slowly improving.  Cardiology checking echocardiogram and showed an EF of 60 to 65%.  Bradycardia is resolved.  Patient remains altered and is currently being treated for a urinary tract infection.  Will obtain blood cultures as well.  Assessment & Plan:   Principal Problem:   Acute encephalopathy Active Problems:   DM (diabetes mellitus) (HCC)   Hypokalemia   Leukocytosis   Acute renal failure superimposed on chronic kidney disease (HCC)   Chest pain   Urinary tract infection  Acute Encephalopathy slowly improving -Patient presented from the  nursing facility after complaining of chest pain and was found to be lethargic and somnolent; remains somnolent and lethargic and withdraws to painful stimuli and open his eyes more and did respond slightly but not significantly much more -Admitted to a progressive bed -C/w Neurochecks  -N.p.o. for now and obtain SLP evaluation; SLP recommending dysphagia 1 diet with nectar thick liquids -Aspiration precautions -Check ABG  and showed a pH of 7.400, PCO2 of 57.3, PO2 139.0, bicarbonate level 35.5, and O2 saturation 99% -Will need PT/OT Evaluation commending skilled nursing facility -Will place on BiPAP( if needed) -Check blood cultures x2 no growth to date less than 24 hours and follow urine culture; initial one showed greater than 100,000 colony-forming units of E. coli and repeat showed less than 10,000 colonies of insignificant growth;  -Chest x-ray showed "Unchanged appearance of the chest with LEFT LOWER lung atelectasis and possible small LEFT pleural effusion." -Follow-up CT scan of the Head showed "No acute intracranial abnormality. Atrophy, chronic microvascular disease. Stable right parietal lobe and bilateral cerebellar chronic ischemic infarcts." -If not improving after treating the urinary tract infection will need to consult neurology for further evaluation -Tomorrow will need to check RPR, B12 level; TSH was 1.269  Acute Renal Failure superimposed on chronic kidney disease stage III Contraction alkalosis  -Patient's baseline creatinine previously noted to be around 1.2 last, but presents with creatinine elevated up to 2.8 with BUN 51.   -C/w Strict intake and output and Monitor OUP -Chloride was 83 and CO2 was 36 on admission; now chloride is 95 and CO2 is 32 -BUN/creatinine is now improved and is now 26/1.59 -Check  urine creatinine, urine urea; Urine Cr was 48.88 -Continue with normal saline IV fluids at 75 mL for now -Continue to Hold diuretics -Avoid nephrotoxic medications,  contrast dyes, hypotension if possible -Continue to monitor and trend renal function -Repeat CMP in a.m.  Chest Pain improved -Troponin is mildly elevated at 26 and 28 and is not consistent with ACS -Had a cardiac catheterization in 2008 with normal coronaries  -EKG with Q waves in the inferior and anterior leads.  Patient with previous history of negative cardiac cath in 2018. -Follow-up repeat echo per Cardiology and this is supposed to be done today  -Appreciate Cardiology Evaluation and Further Recommendations -They do not feel this consistent with an ACS process  Bradycardia with PVCs and PACs and first-degree AV block -Continue to hold metoprolol and any other AV nodal blocking agents for now next-check echocardiogram -Replete electrolytes and potassium was 3.5 and magnesium was 1.2 and currently being repleted -Continue to monitor on telemetry as bradycardia has resolved  E Coli Urinary Tract Infection -Acute.   -Urinalysis positive for signs of infection as showed cloudy appearance, small hemoglobin, large leukocytes, many bacteria, 0-5 squamous epithelial cells and greater than 50 WBCs.   -Initial urine culture growing greater than 100,000 colonies of E. coli and repeat was showing less than 10,000 colonies of insignificant growth -Patient was started on Rocephin IV. -Blood cultures x2 showed no growth to date at less than 24 hours -Continue Rocephin IV for now until sensitivities result  Leukocytosis, improved -Acute. Initial WBC 11 on admission and repeat this AM was 7.8.   -Suspect this could be secondary to the urinary tract infection. -Continue to Monitor and Repeat CBC in AM   Hypokalemia -Acute.  On admission potassium noted to be 2.8 on Admision.   -Replete with IV potassium chloride 40 mEq along with IV potassium phosphate 20 mmol -Checked Magnesium level was 1.6 and will replete -Continue to monitor and Replete as Necessary -Repeat CMP in AM  Hypomagnesemia  -Patient's magnesium level this morning is 2.1 -Replete with IV mag sulfate 2 g yesterday -Continue monitor replete as necessary -Repeat Magnesium level in the a.m.  Hypophosphatemia -Patient's phosphorus level this morning was 1.7 -Replete with IV K-Phos 20 mmol -Continue monitor and replete as necessary -Repeat phosphorus level in a.m.  Diastolic CHF -Patient with 1+ pitting edema of the bilateral lower extremities but is not volume overloaded.  -No significant JVD appreciated.  Last EF noted to be 55 to 60% with grade 1 diastolic dysfunction in 01/8936.;  Repeat echo showed 60 to 65% and consistent with diastolic impairment again -Strict I's/O's and Daily Weights; +3.6 to 9 L since admission -Metoprolol Tartrate 25 mg po BID currently being held due to bradycardia and cardiology continues to recommend to hold it -Continue to Hold Furosemide 80 mg BID and Metolazone 5 mg po Daily -Continue to Monitor Volume Status Carefully    Diabetes Mellitus Type 2 -Hemoglobin A1c noted to be 8.2 on 06/08/2019.  Patient currently altered and n.p.o. therefore we will hold IV insulin at this time. -C/w Hypoglycemic protocol -CBGs every 6 hours -Restart insulin when tolerating a diet; Takes Lantus 10 units sq Daily andSS with Novolog  -CBGs have been ranging from 113-133  Essential Hypertension -Restart home blood pressure medications when medically appropriate -Was on metoprolol Tartrate 25 mg po BID but being held due to Bradycardia -Holding Metolazone 5 mg po Daily and Furosemide 80 mg po BID  History of Stroke -Continue Aspirin 81 mg  po Daily, Simvastatin 20 mg po Daily, and Clopidogrel 75 mg po Daily when able  History of prostate cancer s/p radiation treatment BPH -Continue to Monitor Volume Output   Microcytic Anemia and history of alpha thalassemia -Likely was hemoconcentrated on admission and has a dilutional drop now -Patient's hemoglobin/hematocrit went from 13.2/41.7 is now  11.2/37.3 -Check anemia panel in a.m. -Continue to monitor for signs and symptoms of bleeding; currently no overt bleeding noted -Repeat CBC in the a.m.  Hyponatremia/Hypochloremia, improving -In the setting of diuresis  -Improved with IV fluid resuscitation -Patient sodium went from 134 i and is now 141 -Chloride is improving and went from 83 is now 95 -Continue to monitor and trend -Repeat CMP in the a.m.  Obesity -Estimated body mass index is 34.8 kg/m as calculated from the following:   Height as of 06/10/19: 5\' 10"  (1.778 m).   Weight as of 06/14/19: 110 kg. -Not awake enough for Weight Loss and Dietary Counseling   DVT prophylaxis: Heparin 5,000 units sq q8h Code Status: DO NOT RESUSCITATE  Family Communication: No family present at bedside  Disposition Plan: Remain Inpatient for continued workup and treatment and DC to SNF when medically stable  Consultants:   Cardiology Dr. Kelly/Dr. Debara Pickett   Procedures:  Echocardiogram IMPRESSIONS    1. The left ventricle has normal systolic function with an ejection fraction of 60-65%. The cavity size was normal. There is severe asymmetric left ventricular hypertrophy. Left ventricular diastolic Doppler parameters are consistent with impaired  relaxation. No evidence of left ventricular regional wall motion abnormalities.  2. The right ventricle has normal systolic function. The cavity was normal. There is no increase in right ventricular wall thickness. Right ventricular systolic pressure could not be assessed.  3. There is moderate mitral annular calcification present.  4. The aortic valve is tricuspid. Severely thickening of the aortic valve. Severe calcifcation of the aortic valve. Aortic valve regurgitation is trivial by color flow Doppler.  5. The aorta is abnormal unless otherwise noted.  6. There is mild dilatation of the aortic root measuring 40 mm.  7. The interatrial septum appears to be lipomatous.  FINDINGS  Left  Ventricle: The left ventricle has normal systolic function, with an ejection fraction of 60-65%. The cavity size was normal. There is severe asymmetric left ventricular hypertrophy. Left ventricular diastolic Doppler parameters are consistent with  impaired relaxation. Normal left ventricular filling pressures No evidence of left ventricular regional wall motion abnormalities..  Right Ventricle: The right ventricle has normal systolic function. The cavity was normal. There is no increase in right ventricular wall thickness. Right ventricular systolic pressure could not be assessed.  Left Atrium: Left atrial size was normal in size.  Right Atrium: Right atrial size was normal in size. Right atrial pressure is estimated at 3 mmHg.  Interatrial Septum: No atrial level shunt detected by color flow Doppler. Increased thickness of the atrial septum sparing the fossa ovalis consistent with The interatrial septum appears to be lipomatous.  Pericardium: There is no evidence of pericardial effusion.  Mitral Valve: The mitral valve is normal in structure. There is moderate mitral annular calcification present. Mitral valve regurgitation was not assessed by color flow Doppler.  Tricuspid Valve: The tricuspid valve is normal in structure. Tricuspid valve regurgitation was not visualized by color flow Doppler.  Aortic Valve: The aortic valve is tricuspid Severely thickening of the aortic valve. Severe calcifcation of the aortic valve. Aortic valve regurgitation is trivial by color flow Doppler.  Pulmonic  Valve: The pulmonic valve was normal in structure. Pulmonic valve regurgitation is not visualized by color flow Doppler.  Aorta: The aorta is abnormal unless otherwise noted. There is mild dilatation of the aortic root measuring 40 mm.  Venous: The inferior vena cava measures 1.79 cm, is normal in size with greater than 50% respiratory variability.    +--------------+--------++ LEFT  VENTRICLE         +----------------+---------++ +--------------+--------++ Diastology                PLAX 2D                +----------------+---------++ +--------------+--------++ LV e' lateral:  5.77 cm/s LVIDd:        4.89 cm  +----------------+---------++ +--------------+--------++ LV E/e' lateral:14.1      LVIDs:        3.86 cm  +----------------+---------++ +--------------+--------++ LV PW:        1.02 cm  +--------------+--------++ LV IVS:       1.59 cm  +--------------+--------++ LVOT diam:    2.30 cm  +--------------+--------++ LV SV:        48 ml    +--------------+--------++ LV SV Index:  20.24    +--------------+--------++ LVOT Area:    4.15 cm +--------------+--------++                        +--------------+--------++  +---------------+----------++ RIGHT VENTRICLE           +---------------+----------++ RV Basal diam: 2.81 cm    +---------------+----------++ RV S prime:    12.30 cm/s +---------------+----------++ TAPSE (M-mode):1.7 cm     +---------------+----------++  +---------------+-------++-----------++ LEFT ATRIUM           Index       +---------------+-------++-----------++ LA diam:       4.20 cm1.85 cm/m  +---------------+-------++-----------++ LA Vol (A2C):  81.3 ml35.89 ml/m +---------------+-------++-----------++ LA Vol (A4C):  57.3 ml25.29 ml/m +---------------+-------++-----------++ LA Biplane Vol:69.5 ml30.68 ml/m +---------------+-------++-----------++ +------------+---------++-----------++ RIGHT ATRIUM         Index       +------------+---------++-----------++ RA Pressure:3.00 mmHg            +------------+---------++-----------++ RA Area:    15.80 cm            +------------+---------++-----------++ RA Volume:  36.90 ml 16.29 ml/m +------------+---------++-----------++  +------------+-----------++  AORTIC VALVE            +------------+-----------++ LVOT Vmax:  82.50 cm/s  +------------+-----------++ LVOT Vmean: 53.700 cm/s +------------+-----------++ LVOT VTI:   0.164 m     +------------+-----------++ AR PHT:     312 msec    +------------+-----------++   +-------------+-------++ AORTA                +-------------+-------++ Ao Root diam:4.00 cm +-------------+-------++ Ao Asc diam: 3.65 cm +-------------+-------++  +--------------+----------++  +---------------+---------++ MITRAL VALVE              TRICUSPID VALVE          +--------------+----------++  +---------------+---------++ MV Area (PHT):2.48 cm    Estimated RAP: 3.00 mmHg +--------------+----------++  +---------------+---------++ MV PHT:       88.74 msec +--------------+----------++  +--------------+-------+ MV Decel Time:306 msec    SHUNTS                +--------------+----------++  +--------------+-------+ +--------------+-----------++ Systemic VTI: 0.16 m  MV E velocity:81.20 cm/s  +--------------+-------+ +--------------+-----------++ Systemic Diam:2.30 cm MV A velocity:135.00 cm/s +--------------+-------+ +--------------+-----------++ MV E/A ratio: 0.60        +--------------+-----------++  +---------+-------+  IVC              +---------+-------+ IVC diam:1.79 cm +---------+-------+    Antimicrobials:  Anti-infectives (From admission, onward)   Start     Dose/Rate Route Frequency Ordered Stop   07/04/19 1500  cefTRIAXone (ROCEPHIN) 1 g in sodium chloride 0.9 % 100 mL IVPB     1 g 200 mL/hr over 30 Minutes Intravenous Every 24 hours 07/03/19 1557     07/03/19 1445  cefTRIAXone (ROCEPHIN) 1 g in sodium chloride 0.9 % 100 mL IVPB     1 g 200 mL/hr over 30 Minutes Intravenous  Once 07/03/19 1430 07/03/19 1838     Subjective: Seen and examined at bedside and he was sitting in a chair and still somnolent but  easily arousable.  Was able to converse with me a little bit today but kept his eyes closed throughout the entire encounter and will state yes and no.  He did monitor something that I did not understand.  No other concerns or complaints at this time and PT OT recommending SNF and SLP recommending dysphagia 1 diet with nectar thick liquids.  Objective: Vitals:   07/05/19 0352 07/05/19 0700 07/05/19 1100 07/05/19 1200  BP: (!) 140/59 (!) 145/55 (!) 140/112 (!) 147/56  Pulse: 80 86 94 75  Resp: 17 17 17    Temp: 97.8 F (36.6 C) 98.6 F (37 C) 98.5 F (36.9 C)   TempSrc: Oral Oral Oral   SpO2: 94% 100% 100%     Intake/Output Summary (Last 24 hours) at 07/05/2019 1521 Last data filed at 07/05/2019 1500 Gross per 24 hour  Intake 2104.55 ml  Output -  Net 2104.55 ml   There were no vitals filed for this visit.  Examination: Physical Exam:  Constitutional: Well-nourished, well-developed obese Caucasian male currently no acute distress appears calm and remains drowsy sitting in the chair bedside Eyes: Lids and conjunctive are normal.  Sclera icteric ENMT: External ears and nose appear normal.  Grossly normal hearing.  Mucous members are moist Neck: Appears supple no JVD Respiratory: Diminished auscultation bilaterally with no appreciable wheezing, rales, rhonchi.  Patient not tachypneic wheezing and accessory muscles to breathe Cardiovascular: Regular rate and rhythm.  No appreciable murmurs, rubs, gallops.  Has 1+ lower extremity edema noted bilaterally Abdomen: Soft, non-tender.  Distended secondary body habitus.  Bowel sounds present GU: Deferred Musculoskeletal: No contractures or cyanosis.  No joint deformities in the upper and lower extremities Skin: No rashes or lesions but does have some some lower extremity skin changes with erythema consistent with venous stasis and chronic leg swelling Neurologic: Cranial nerves II through XII grossly intact and did open his eyes and monitor  something to me today.  Responds to painful stimuli but also did respond to verbal stimuli today. Psychiatric:.  Impaired judgment and insight.  Remains somnolent and drowsy but a little bit more awake today  Data Reviewed: I have personally reviewed following labs and imaging studies  CBC: Recent Labs  Lab 07/03/19 1121 07/03/19 1613 07/04/19 0434 07/05/19 0329  WBC 11.0*  --  8.8 7.8  NEUTROABS 8.4*  --   --  5.8  HGB 13.2 11.9* 11.0* 11.2*  HCT 41.7 35.0* 35.8* 37.3*  MCV 69.5*  --  70.5* 70.6*  PLT 244  --  216 637   Basic Metabolic Panel: Recent Labs  Lab 07/03/19 1121 07/03/19 1613 07/04/19 0434 07/05/19 0329  NA 134* 135 137 141  K 2.8* 2.9* 3.5 3.0*  CL  83*  --  92* 95*  CO2 36*  --  31 32  GLUCOSE 99  --  132* 133*  BUN 51*  --  41* 26*  CREATININE 2.80*  --  2.12* 1.59*  CALCIUM 10.3  --  9.3 9.4  MG  --   --  1.6* 2.1  PHOS  --   --   --  1.7*   GFR: CrCl cannot be calculated (Unknown ideal weight.). Liver Function Tests: Recent Labs  Lab 07/03/19 1121 07/05/19 0329  AST 31 22  ALT 17 11  ALKPHOS 79 58  BILITOT 0.9 0.9  PROT 7.7 6.1*  ALBUMIN 3.9 2.8*   Recent Labs  Lab 07/03/19 1121  LIPASE 46   Recent Labs  Lab 07/03/19 1156  AMMONIA 13   Coagulation Profile: No results for input(s): INR, PROTIME in the last 168 hours. Cardiac Enzymes: No results for input(s): CKTOTAL, CKMB, CKMBINDEX, TROPONINI in the last 168 hours. BNP (last 3 results) No results for input(s): PROBNP in the last 8760 hours. HbA1C: No results for input(s): HGBA1C in the last 72 hours. CBG: Recent Labs  Lab 07/04/19 1146 07/04/19 2352 07/05/19 0612 07/05/19 0726 07/05/19 1152  GLUCAP 133* 131* 141* 153* 170*   Lipid Profile: No results for input(s): CHOL, HDL, LDLCALC, TRIG, CHOLHDL, LDLDIRECT in the last 72 hours. Thyroid Function Tests: Recent Labs    07/03/19 1822  TSH 1.269   Anemia Panel: No results for input(s): VITAMINB12, FOLATE, FERRITIN,  TIBC, IRON, RETICCTPCT in the last 72 hours. Sepsis Labs: No results for input(s): PROCALCITON, LATICACIDVEN in the last 168 hours.  Recent Results (from the past 240 hour(s))  SARS Coronavirus 2 [facility] order, Performed in [facility] hospital lab) Nasopharyngeal Nasopharyngeal Swab     Status: None   Collection Time: 07/03/19 11:22 AM   Specimen: Nasopharyngeal Swab  Result Value Ref Range Status   SARS Coronavirus 2 NEGATIVE NEGATIVE Final    Comment: (NOTE) If result is NEGATIVE SARS-CoV-2 target nucleic acids are NOT DETECTED. The SARS-CoV-2 RNA is generally detectable in upper and lower  respiratory specimens during the acute phase of infection. The lowest  concentration of SARS-CoV-2 viral copies this assay can detect is 250  copies / mL. A negative result does not preclude SARS-CoV-2 infection  and should not be used as the sole basis for treatment or other  patient management decisions.  A negative result may occur with  improper specimen collection / handling, submission of specimen other  than nasopharyngeal swab, presence of viral mutation(s) within the  areas targeted by this assay, and inadequate number of viral copies  (<250 copies / mL). A negative result must be combined with clinical  observations, patient history, and epidemiological information. If result is POSITIVE SARS-CoV-2 target nucleic acids are DETECTED. The SARS-CoV-2 RNA is generally detectable in upper and lower  respiratory specimens dur ing the acute phase of infection.  Positive  results are indicative of active infection with SARS-CoV-2.  Clinical  correlation with patient history and other diagnostic information is  necessary to determine patient infection status.  Positive results do  not rule out bacterial infection or co-infection with other viruses. If result is PRESUMPTIVE POSTIVE SARS-CoV-2 nucleic acids MAY BE PRESENT.   A presumptive positive result was obtained on the submitted  specimen  and confirmed on repeat testing.  While 2019 novel coronavirus  (SARS-CoV-2) nucleic acids may be present in the submitted sample  additional confirmatory testing may be necessary for epidemiological  and / or clinical management purposes  to differentiate between  SARS-CoV-2 and other Sarbecovirus currently known to infect humans.  If clinically indicated additional testing with an alternate test  methodology [phone]) is advised. The SARS-CoV-2 RNA is generally  detectable in upper and lower respiratory sp ecimens during the acute  phase of infection. The expected result is Negative. Fact Sheet for Patients:  StrictlyIdeas.no Fact Sheet for Healthcare Providers: BankingDealers.co.za This test is not yet approved or cleared by the [place] FDA and has been authorized for detection and/or diagnosis of SARS-CoV-2 by FDA under an Emergency Use Authorization (EUA).  This EUA will remain in effect (meaning this test can be used) for the duration of the COVID-19 declaration under Section 564(b)(1) of the Act, 21 U.S.C. section 360bbb-3(b)(1), unless the authorization is terminated or revoked sooner. Performed at [facility], [place] [address], [place], [place] [address]   Urine culture     Status: Abnormal (Preliminary result)   Collection Time: 07/03/19  1:30 PM   Specimen: Urine, Random  Result Value Ref Range Status   Specimen Description URINE, RANDOM  Final   Special Requests   Final    NONE Performed at [facility], [place] [address], [place], [place] [address]    Culture >=100,000 COLONIES/mL ESCHERICHIA COLI (A)  Final   Report Status PENDING  Incomplete  Culture, blood (routine x 2)     Status: None (Preliminary result)   Collection Time: 07/04/19  9:25 AM   Specimen: BLOOD  Result Value Ref Range Status   Specimen Description BLOOD LEFT ANTECUBITAL  Final   Special Requests AEROBIC BOTTLE ONLY Blood  Culture adequate volume  Final   Culture   Final    NO GROWTH < 24 HOURS Performed at [facility], [place] [address] [place], [place] [address]    Report Status PENDING  Incomplete  Culture, blood (routine x 2)     Status: None (Preliminary result)   Collection Time: 07/04/19  9:25 AM   Specimen: BLOOD  Result Value Ref Range Status   Specimen Description BLOOD LEFT ANTECUBITAL  Final   Special Requests AEROBIC BOTTLE ONLY Blood Culture adequate volume  Final   Culture   Final    NO GROWTH < 24 HOURS Performed at [facility], [place] [address], [place], [place] [address]    Report Status PENDING  Incomplete  Culture, Urine     Status: Abnormal   Collection Time: 07/04/19 10:46 AM   Specimen: Urine, Random  Result Value Ref Range Status   Specimen Description URINE, RANDOM  Final   Special Requests NONE  Final   Culture (A)  Final    <10,000 COLONIES/mL INSIGNIFICANT GROWTH Performed at [facility], [place] [address], [place], [place] [address]    Report Status 07/05/2019 FINAL  Final    Radiology Studies: Ct Head Wo Contrast  Result Date: 07/03/2019 CLINICAL DATA:  Altered mental status. Weakness. EXAM: CT HEAD WITHOUT CONTRAST TECHNIQUE: Contiguous axial images were obtained from the base of the skull through the vertex without intravenous contrast. COMPARISON:  May 22, 2018 FINDINGS: Brain: No evidence of acute infarction, hemorrhage, hydrocephalus, extra-axial collection or mass lesion/mass effect. Moderate brain parenchymal volume loss. Advanced chronic small vessel ischemia of the deep white matter. Stable remote right parietal lobe ischemic infarct. Stable appearance of bilateral cerebellar chronic ischemic infarcts. Vascular: Calcific atherosclerotic disease of the intra cavernous carotid arteries. Skull: Normal. Negative for fracture  or focal lesion. Sinuses/Orbits: No acute finding. Other: None. IMPRESSION: 1. No acute intracranial abnormality. 2. Atrophy,  chronic microvascular disease. 3. Stable right parietal lobe and bilateral cerebellar chronic ischemic infarcts. Electronically Signed   By: Fidela Salisbury M.D.   On: 07/03/2019 16:14   Scheduled Meds: . antiseptic oral rinse  15 mL Mouth Rinse TID  . heparin  5,000 Units Subcutaneous Q8H  . sodium chloride flush  3 mL Intravenous Q12H   Continuous Infusions: . sodium chloride 75 mL/hr at 07/04/19 2241  . sodium chloride 10 mL/hr at 07/04/19 0500  . cefTRIAXone (ROCEPHIN)  IV 1 g (07/04/19 1610)  . potassium chloride 10 mEq (07/05/19 1458)  . potassium PHOSPHATE IVPB (in mmol) 20 mmol (07/05/19 1017)    LOS: 2 days   Kerney Elbe, DO [facility] PAGER is on [place]  If 7PM-7AM, please contact night-coverage [email or web address] Password [facility] 07/05/2019, 3:21 PM

## 2019-07-05 NOTE — Progress Notes (Signed)
  Speech Language Pathology Treatment: Dysphagia  Patient Details Name: ANAND TEJADA MRN: [id number] DOB: [birth year] Today's Date: 07/05/2019 Time: 4825-0037 SLP Time Calculation (min) (ACUTE ONLY): 15 min  Assessment / Plan / Recommendation Clinical Impression  Pt with improved alertness level for swallow re-assessment; mod-max multimodal cues provided to keep pt alert/swallowing during re-assessment; Limited oral care completed (minimal d/t pt refusal/clamping of mouth); pt shook his head "no" initially when asked to consume POs, but would open his mouth with min-mod verbal/tactile cues provided with thin/nectar-thickened liquids via tsp only and puree with tsp amounts with slight oral holding initially after oral care completed (minimal d/t pt refusal/clamping of mouth) and delay in the initiation of the swallow which could represent presbyphagia and/or cognitive-based dysphagia; recommend initiating a diet of Dysphagia 1/nectar-thickened liquids for safety purposes d/t pt arousal level and cognitive impairment.  No overt s/s of aspiration present with tsp of thin, but pt would benefit at least initially with nectar-thickened liquids for safety with swallowing.  ST will f/u acutely while in hospital for diet tolerance and potential upgrade as pt able.  HPI HPI: QUASHAUN LAZALDE is a 85 y.o. male with medical history significant of hypertension, hyperlipidemia, diabetes mellitus type 2, history of stroke, paroxysmal atrial fibrillation not on anticoagulation, OSA, and alpha thalassemia; who presented from the nursing facility initially with reported complaints of chest pain.  Reports note that chest pain resolved prior to EMS arrival.  History is limited however as patient found to be altered and hard to arouse currently.  He was unable to answer questions at that time.  He was found to have acute ecephalopathy, UTI and acute renal failure.  Chest xray showing unchanged appearance of the chest from 7/13 with  LLL  atelectasis and possible pleural effusion.      SLP Plan  Goals updated       Recommendations  Diet recommendations: Dysphagia 1 (puree);Nectar-thick liquid Liquids provided via: Teaspoon Medication Administration: Crushed with puree Supervision: Full supervision/cueing for compensatory strategies Compensations: Slow rate;Small sips/bites;Minimize environmental distractions;Follow solids with liquid Postural Changes and/or Swallow Maneuvers: Seated upright 90 degrees                Oral Care Recommendations: Oral care BID Follow up Recommendations: Other (comment)(TBD) SLP Visit Diagnosis: Dysphagia, unspecified (R13.10) Plan: Goals updated                       Elvina Sidle, M.S., CCC-SLP 07/05/2019, 12:53 PM

## 2019-07-06 LAB — COMPREHENSIVE METABOLIC PANEL
ALT: 11 U/L (ref 0–44)
AST: 21 U/L (ref 15–41)
Albumin: 3 g/dL — ABNORMAL LOW (ref 3.5–5.0)
Alkaline Phosphatase: 65 U/L (ref 38–126)
Anion gap: 12 (ref 5–15)
BUN: 18 mg/dL (ref 8–23)
CO2: 32 mmol/L (ref 22–32)
Calcium: 9.4 mg/dL (ref 8.9–10.3)
Chloride: 97 mmol/L — ABNORMAL LOW (ref 98–111)
Creatinine, Ser: 1.25 mg/dL — ABNORMAL HIGH (ref 0.61–1.24)
GFR calc Af Amer: >60 mL/min (ref >60)
GFR calc non Af Amer: 52 mL/min — ABNORMAL LOW (ref >60)
Glucose, Bld: 180 mg/dL — ABNORMAL HIGH (ref 70–99)
Potassium: 3.4 mmol/L — ABNORMAL LOW (ref 3.5–5.1)
Sodium: 141 mmol/L (ref 135–145)
Total Bilirubin: 0.6 mg/dL (ref 0.3–1.2)
Total Protein: 6.6 g/dL (ref 6.5–8.1)

## 2019-07-06 LAB — CBC WITH DIFFERENTIAL/PLATELET
Abs Immature Granulocytes: 0.03 10*3/uL (ref 0.00–0.07)
Basophils Absolute: 0 10*3/uL (ref 0.0–0.1)
Basophils Relative: 0 %
Eosinophils Absolute: 0.2 10*3/uL (ref 0.0–0.5)
Eosinophils Relative: 2 %
HCT: 40.8 % (ref 39.0–52.0)
Hemoglobin: 12.3 g/dL — ABNORMAL LOW (ref 13.0–17.0)
Immature Granulocytes: 0 %
Lymphocytes Relative: 14 %
Lymphs Abs: 1.2 10*3/uL (ref 0.7–4.0)
MCH: 21.5 pg — ABNORMAL LOW (ref 26.0–34.0)
MCHC: 30.1 g/dL (ref 30.0–36.0)
MCV: 71.5 fL — ABNORMAL LOW (ref 80.0–100.0)
Monocytes Absolute: 1.1 10*3/uL — ABNORMAL HIGH (ref 0.1–1.0)
Monocytes Relative: 13 %
Neutro Abs: 6 10*3/uL (ref 1.7–7.7)
Neutrophils Relative %: 71 %
Platelets: 175 10*3/uL (ref 150–400)
RBC: 5.71 MIL/uL (ref 4.22–5.81)
RDW: 15.7 % — ABNORMAL HIGH (ref 11.5–15.5)
WBC: 8.5 10*3/uL (ref 4.0–10.5)
nRBC: 0 % (ref 0.0–0.2)

## 2019-07-06 LAB — GLUCOSE, CAPILLARY
Glucose-Capillary: 164 mg/dL — ABNORMAL HIGH (ref 70–99)
Glucose-Capillary: 187 mg/dL — ABNORMAL HIGH (ref 70–99)
Glucose-Capillary: 161 mg/dL — ABNORMAL HIGH (ref 70–99)

## 2019-07-06 LAB — IRON AND TIBC
Iron: 57 ug/dL (ref 45–182)
Saturation Ratios: 24 % (ref 17.9–39.5)
TIBC: 241 ug/dL — ABNORMAL LOW (ref 250–450)
UIBC: 184 ug/dL

## 2019-07-06 LAB — RETICULOCYTES
Immature Retic Fract: 12.7 % (ref 2.3–15.9)
RBC.: 5.71 MIL/uL (ref 4.22–5.81)
Retic Count, Absolute: 85.7 10*3/uL (ref 19.0–186.0)
Retic Ct Pct: 1.5 % (ref 0.4–3.1)

## 2019-07-06 LAB — PHOSPHORUS: Phosphorus: 1.8 mg/dL — ABNORMAL LOW (ref 2.5–4.6)

## 2019-07-06 LAB — MAGNESIUM: Magnesium: 1.8 mg/dL (ref 1.7–2.4)

## 2019-07-06 LAB — VITAMIN B12: Vitamin B-12: 1393 pg/mL — ABNORMAL HIGH (ref 180–914)

## 2019-07-06 LAB — FOLATE: Folate: 4.8 ng/mL — ABNORMAL LOW (ref >5.9)

## 2019-07-06 LAB — FERRITIN: Ferritin: 130 ng/mL (ref 24–336)

## 2019-07-06 MED ORDER — POTASSIUM PHOSPHATES 15 MMOLE/5ML IV SOLN
20.0000 mmol | Freq: Once | INTRAVENOUS | Status: AC
  Administered 2019-07-06: 20 mmol via INTRAVENOUS
  Filled 2019-07-06: qty 6.67

## 2019-07-06 MED ORDER — GERHARDT'S BUTT CREAM
TOPICAL_CREAM | Freq: Three times a day (TID) | CUTANEOUS | Status: DC
  Administered 2019-07-06 – 2019-07-09 (×9): via TOPICAL
  Filled 2019-07-06: qty 1

## 2019-07-06 MED ORDER — SODIUM CHLORIDE 0.9 % IV SOLN
INTRAVENOUS | Status: AC
  Administered 2019-07-06: 14:00:00 via INTRAVENOUS

## 2019-07-06 MED ORDER — POTASSIUM CHLORIDE 10 MEQ/100ML IV SOLN
10.0000 meq | INTRAVENOUS | Status: AC
  Administered 2019-07-06 (×3): 10 meq via INTRAVENOUS
  Filled 2019-07-06 (×2): qty 100

## 2019-07-06 MED ORDER — POTASSIUM CHLORIDE 10 MEQ/100ML IV SOLN
INTRAVENOUS | Status: AC
  Filled 2019-07-06: qty 100

## 2019-07-06 NOTE — Progress Notes (Signed)
PROGRESS NOTE    Jared Hall  LKG:[id number] DOB: 09/04/1931 DOA: 07/03/2019 PCP: Josetta Huddle, MD  Brief Narrative:  HPI per Dr. Fuller Plan on 07/03/2019 Jared Hall is a 85 y.o. male with medical history significant of hypertension, hyperlipidemia, diabetes mellitus type 2, history of stroke, paroxysmal atrial fibrillation not on anticoagulation, OSA, and alpha thalassemia; who presented from the nursing facility initially with reported complaints of chest pain.  Reports note that chest pain resolved prior to EMS arrival.  History is limited however as patient found to be altered and hard to arouse currently.  Cannot get him to answer questions at this time.   ED Course: Upon admission into the emergency department patient was noted to be afebrile, pulse 73-88, respirations 15-25, blood pressures 142 138/103, and O2 saturations maintained on room air.  Labs revealed WBC 11, sodium 134, potassium 2.8, chloride 83, CO2 36, BUN 55, and creatinine 2.8.  COVID-19 testing negative. Urinalysis positive for small hemoglobin, large leukocytes, many bacteria, and greater than 50 WBCs.  Patient was given 500 mL of normal saline, 40 mEq of potassium chloride IV, and Rocephin.  Nursing staff while in the ED noted heart rates going down into the 30s with appearance of first-degree heart block.  **Interim History  Patient remained drowsy and encephalopathic slowly improving today he is more alert and able to tell me his name and was more interactive.  Cardiology checking echocardiogram and showed an EF of 60 to 65%.  Bradycardia is resolved.  Patient is urinary tract infection is being treated culture showed no growth to date at 2 days.  He is improving slowly and renal function is appropriately started down but we will stop IV fluids currently given his diastolic dysfunction.  Assessment & Plan:   Principal Problem:   Acute encephalopathy Active Problems:   DM (diabetes mellitus) (HCC)   Hypokalemia   Leukocytosis   Acute renal failure superimposed on chronic kidney disease (HCC)   Chest pain   Urinary tract infection  Acute Encephalopathy slowly improving -Patient presented from the nursing facility after complaining of chest pain and was found to be lethargic and somnolent; not as somnolent and states that he is feeling much better since coming in -Admitted to a progressive bed -C/w Neurochecks  -N.p.o. for now and obtain SLP evaluation; SLP recommending dysphagia 1 diet with nectar thick liquids we will continue now -Aspiration precautions -Check ABG  and showed a pH of 7.400, PCO2 of 57.3, PO2 139.0, bicarbonate level 35.5, and O2 saturation 99% -Will need PT/OT Evaluation are recommending skilled nursing facility -Will place on BiPAP( if needed) -Check blood cultures x2 no growth to at 2 days and follow urine culture; initial one showed greater than 100,000 colony-forming units of E. coli and repeat showed less than 10,000 colonies of insignificant growth; initial urinalysis shows that the patient's E. coli is pansensitive and will continue current antibiotics until he is fully taken p.o. -Chest x-ray showed "Unchanged appearance of the chest with LEFT LOWER lung atelectasis and possible small LEFT pleural effusion." -Follow-up CT scan of the Head showed "No acute intracranial abnormality. Atrophy, chronic microvascular disease. Stable right parietal lobe and bilateral cerebellar chronic ischemic infarcts." -If not improving after treating the urinary tract infection will need to consult neurology for further evaluation but since he is getting better we will hold off -RPR is pending and B12 level was 1393; TSH was 1.269  Acute Renal Failure superimposed on chronic kidney disease stage III Contraction  alkalosis  -Patient's baseline creatinine previously noted to be around 1.2 last, but presents with creatinine elevated up to 2.8 with BUN 51.   -C/w Strict intake and output and  Monitor OUP -Chloride was 83 and CO2 was 36 on admission; now chloride is 95 and CO2 is 32 -BUN/creatinine is now improved and is now 18/1.25 -Check urine creatinine, urine urea; Urine Cr was 48.88 -Continue with normal saline IV fluids but reduce rate from 75 mL to 50 mL/hr for 10 more hours  -Continue to Hold diuretics -Avoid nephrotoxic medications, contrast dyes, hypotension if possible -Continue to monitor and trend renal function -Repeat CMP in a.m.  Chest Pain improved -Troponin is mildly elevated at 26 and 28 and is not consistent with ACS -Had a cardiac catheterization in 2008 with normal coronaries  -EKG with Q waves in the inferior and anterior leads.  Patient with previous history of negative cardiac cath in 2018. -Follow-up repeat echo per Cardiology and this is supposed to be done today  -Appreciate Cardiology Evaluation and Further Recommendations -They do not feel this consistent with an ACS process  Bradycardia with PVCs and PACs and first-degree AV block -Continue to hold metoprolol and any other AV nodal blocking agents for now next-check echocardiogram -Replete electrolytes and potassium was 3.5 and magnesium was 1.2 and currently being repleted -Continue to monitor on telemetry as bradycardia has resolved  E Coli Urinary Tract Infection -Acute.   -Urinalysis positive for signs of infection as showed cloudy appearance, small hemoglobin, large leukocytes, many bacteria, 0-5 squamous epithelial cells and greater than 50 WBCs.   -Initial urine culture growing greater than 100,000 colonies of E. coli and repeat was showing less than 10,000 colonies of insignificant growth -Patient was started on Rocephin IV. -Blood cultures x2 showed no growth to date at 2 Days -Continue Rocephin IV for now until sensitivities result  Leukocytosis, improved -Acute. Initial WBC 11 on admission and repeat this AM was 8.5  -Suspect this could be secondary to the urinary tract  infection. -Continue to Monitor and Repeat CBC in AM   Hypokalemia -Acute.On admission potassium noted to be 2.8 on Admision.   -Replete with IV potassium chloride 30 mEq along with IV potassium phosphate 20 mmol  -Checked Magnesium level was 1.8 -Continue to monitor and Replete as Necessary -Repeat CMP in AM  Hypomagnesemia -Patient's magnesium level this morning is 1.8 -Continue monitor replete as necessary -Repeat Magnesium level in the a.m.  Hypophosphatemia -Patient's phosphorus level this morning was 1.8 -Replete with IV K-Phos 20 mmol -Continue monitor and replete as necessary -Repeat phosphorus level in a.m.  Diastolic CHF -Patient with 1+ pitting edema of the bilateral lower extremities but is not volume overloaded.  -No significant JVD appreciated.  Last EF noted to be 55 to 60% with grade 1 diastolic dysfunction in 07/7672.;  Repeat echo showed 60 to 65% and consistent with diastolic impairment again -Strict I's/O's and Daily Weights; +3.896 L since admission -Metoprolol Tartrate 25 mg po BID currently being held due to bradycardia and cardiology continues to recommend to hold it for now and follow up with Dr. Radford Pax to resume  -Continue to Hold Furosemide 80 mg BID and Metolazone 5 mg po Daily for now  -Continue to Monitor Volume Status Carefully    Diabetes Mellitus Type 2 -Hemoglobin A1c noted to be 8.2 on 06/08/2019.  Patient currently altered and n.p.o. therefore we will hold IV insulin at this time. -C/w Hypoglycemic protocol -CBGs every 6 hours -Restart insulin  when tolerating a diet; Takes Lantus 10 units sq Daily andSS with Novolog  -CBGs have been ranging from 131-187  Essential Hypertension -Restart home blood pressure medications when medically appropriate -Was on metoprolol Tartrate 25 mg po BID but being held due to Bradycardia -Holding Metolazone 5 mg po Daily and Furosemide 80 mg po BID  History of Stroke -Continue Aspirin 81 mg po Daily,  Simvastatin 20 mg po Daily, and Clopidogrel 75 mg po Daily when able  History of prostate cancer s/p radiation treatment BPH -Continue to Monitor Volume Output   Microcytic Anemia and history of alpha thalassemia -Likely was hemoconcentrated on admission and has a dilutional drop now -Patient's hemoglobin/hematocrit went from 13.2/41.7 is now 12.3/40.8 -Checked Anemia panel showed an iron level of 57, U IBC 184, TIBC of 241, saturation ratios of 24%, ferritin level 130, folate level 4.8, and vitamin B12 level of 1393 -Continue to monitor for signs and symptoms of bleeding; currently no overt bleeding noted -Repeat CBC in the a.m.  Hyponatremia/Hypochloremia, improving -In the setting of diuresis  -Improved with IV fluid resuscitation -Patient sodium went from 134 and is now 141 -Chloride is improving and went from 83 is now 97 -Continue to monitor and trend -Repeat CMP in the a.m.  Obesity -Estimated body mass index is 35.59 kg/m as calculated from the following:   Height as of 06/10/19: 5\' 10"  (1.778 m).   Weight as of this encounter: 112.5 kg. -Not awake enough for Weight Loss and Dietary Counseling   DVT prophylaxis: Heparin 5,000 units sq q8h Code Status: DO NOT RESUSCITATE  Family Communication: No family present at bedside  Disposition Plan: Remain Inpatient for continued workup and treatment and DC to SNF when medically stable  Consultants:   Cardiology Dr. Kelly/Dr. Debara Pickett   Procedures:  Echocardiogram IMPRESSIONS    1. The left ventricle has normal systolic function with an ejection fraction of 60-65%. The cavity size was normal. There is severe asymmetric left ventricular hypertrophy. Left ventricular diastolic Doppler parameters are consistent with impaired  relaxation. No evidence of left ventricular regional wall motion abnormalities.  2. The right ventricle has normal systolic function. The cavity was normal. There is no increase in right ventricular wall  thickness. Right ventricular systolic pressure could not be assessed.  3. There is moderate mitral annular calcification present.  4. The aortic valve is tricuspid. Severely thickening of the aortic valve. Severe calcifcation of the aortic valve. Aortic valve regurgitation is trivial by color flow Doppler.  5. The aorta is abnormal unless otherwise noted.  6. There is mild dilatation of the aortic root measuring 40 mm.  7. The interatrial septum appears to be lipomatous.  FINDINGS  Left Ventricle: The left ventricle has normal systolic function, with an ejection fraction of 60-65%. The cavity size was normal. There is severe asymmetric left ventricular hypertrophy. Left ventricular diastolic Doppler parameters are consistent with  impaired relaxation. Normal left ventricular filling pressures No evidence of left ventricular regional wall motion abnormalities..  Right Ventricle: The right ventricle has normal systolic function. The cavity was normal. There is no increase in right ventricular wall thickness. Right ventricular systolic pressure could not be assessed.  Left Atrium: Left atrial size was normal in size.  Right Atrium: Right atrial size was normal in size. Right atrial pressure is estimated at 3 mmHg.  Interatrial Septum: No atrial level shunt detected by color flow Doppler. Increased thickness of the atrial septum sparing the fossa ovalis consistent with The interatrial septum  appears to be lipomatous.  Pericardium: There is no evidence of pericardial effusion.  Mitral Valve: The mitral valve is normal in structure. There is moderate mitral annular calcification present. Mitral valve regurgitation was not assessed by color flow Doppler.  Tricuspid Valve: The tricuspid valve is normal in structure. Tricuspid valve regurgitation was not visualized by color flow Doppler.  Aortic Valve: The aortic valve is tricuspid Severely thickening of the aortic valve. Severe calcifcation  of the aortic valve. Aortic valve regurgitation is trivial by color flow Doppler.  Pulmonic Valve: The pulmonic valve was normal in structure. Pulmonic valve regurgitation is not visualized by color flow Doppler.  Aorta: The aorta is abnormal unless otherwise noted. There is mild dilatation of the aortic root measuring 40 mm.  Venous: The inferior vena cava measures 1.79 cm, is normal in size with greater than 50% respiratory variability.    +--------------+--------++ LEFT VENTRICLE         +----------------+---------++ +--------------+--------++ Diastology                PLAX 2D                +----------------+---------++ +--------------+--------++ LV e' lateral:  5.77 cm/s LVIDd:        4.89 cm  +----------------+---------++ +--------------+--------++ LV E/e' lateral:14.1      LVIDs:        3.86 cm  +----------------+---------++ +--------------+--------++ LV PW:        1.02 cm  +--------------+--------++ LV IVS:       1.59 cm  +--------------+--------++ LVOT diam:    2.30 cm  +--------------+--------++ LV SV:        48 ml    +--------------+--------++ LV SV Index:  20.24    +--------------+--------++ LVOT Area:    4.15 cm +--------------+--------++                        +--------------+--------++  +---------------+----------++ RIGHT VENTRICLE           +---------------+----------++ RV Basal diam: 2.81 cm    +---------------+----------++ RV S prime:    12.30 cm/s +---------------+----------++ TAPSE (M-mode):1.7 cm     +---------------+----------++  +---------------+-------++-----------++ LEFT ATRIUM           Index       +---------------+-------++-----------++ LA diam:       4.20 cm1.85 cm/m  +---------------+-------++-----------++ LA Vol (A2C):  81.3 ml35.89 ml/m +---------------+-------++-----------++ LA Vol (A4C):  57.3 ml25.29 ml/m  +---------------+-------++-----------++ LA Biplane Vol:69.5 ml30.68 ml/m +---------------+-------++-----------++ +------------+---------++-----------++ RIGHT ATRIUM         Index       +------------+---------++-----------++ RA Pressure:3.00 mmHg            +------------+---------++-----------++ RA Area:    15.80 cm            +------------+---------++-----------++ RA Volume:  36.90 ml 16.29 ml/m +------------+---------++-----------++  +------------+-----------++ AORTIC VALVE            +------------+-----------++ LVOT Vmax:  82.50 cm/s  +------------+-----------++ LVOT Vmean: 53.700 cm/s +------------+-----------++ LVOT VTI:   0.164 m     +------------+-----------++ AR PHT:     312 msec    +------------+-----------++   +-------------+-------++ AORTA                +-------------+-------++ Ao Root diam:4.00 cm +-------------+-------++ Ao Asc diam: 3.65 cm +-------------+-------++  +--------------+----------++  +---------------+---------++ MITRAL VALVE              TRICUSPID VALVE          +--------------+----------++  +---------------+---------++  MV Area (PHT):2.48 cm    Estimated RAP: 3.00 mmHg +--------------+----------++  +---------------+---------++ MV PHT:       88.74 msec +--------------+----------++  +--------------+-------+ MV Decel Time:306 msec    SHUNTS                +--------------+----------++  +--------------+-------+ +--------------+-----------++ Systemic VTI: 0.16 m  MV E velocity:81.20 cm/s  +--------------+-------+ +--------------+-----------++ Systemic Diam:2.30 cm MV A velocity:135.00 cm/s +--------------+-------+ +--------------+-----------++ MV E/A ratio: 0.60        +--------------+-----------++  +---------+-------+ IVC              +---------+-------+ IVC diam:1.79 cm +---------+-------+    Antimicrobials:   Anti-infectives (From admission, onward)   Start     Dose/Rate Route Frequency Ordered Stop   07/04/19 1500  cefTRIAXone (ROCEPHIN) 1 g in sodium chloride 0.9 % 100 mL IVPB     1 g 200 mL/hr over 30 Minutes Intravenous Every 24 hours 07/03/19 1557     07/03/19 1445  cefTRIAXone (ROCEPHIN) 1 g in sodium chloride 0.9 % 100 mL IVPB     1 g 200 mL/hr over 30 Minutes Intravenous  Once 07/03/19 1430 07/03/19 1838     Subjective: Seen and examined at bedside and he was more awake and alert today.  Able to tell me his name and tell me that he was in the hospital but did not know which one.  States he came in because of "pain" but states his pain is resolved now.  No nausea or vomiting.  No other concerns or complaints at this time.  Objective: Vitals:   07/05/19 2313 07/06/19 0352 07/06/19 0725 07/06/19 1150  BP: (!) 145/62 140/60 (!) 129/51 121/78  Pulse: (!) 54 (!) 59 80 83  Resp: 18 18 17 20   Temp: 98.3 F (36.8 C) 98.1 F (36.7 C) 98 F (36.7 C) 97.8 F (36.6 C)  TempSrc: Oral Oral Axillary Oral  SpO2: 97% 98% 96% 97%  Weight:  112.5 kg      Intake/Output Summary (Last 24 hours) at 07/06/2019 1207 Last data filed at 07/06/2019 1100 Gross per 24 hour  Intake 2565.95 ml  Output 1600 ml  Net 965.95 ml   Filed Weights   07/06/19 0352  Weight: 112.5 kg    Examination: Physical Exam:  Constitutional: Well-nourished, well-developed obese Caucasian male currently no acute distress appears more awake today and calm Eyes: Lids and conjunctive are normal.  Sclera nonicteric ENMT: External ears nose appear normal.  Grossly normal hearing.  Mucous members are moist Neck: Appears supple no JVD Respiratory: Diminished auscultation bilaterally no appreciable wheezing, rales, rhonchi.  Patient not tachypneic or using accessory muscles to breathe Cardiovascular: Regular rate and rhythm.  No appreciable murmurs, rubs, gallops.  Has 1+ lower extremity edema bilateral Abdomen: Soft,  nontender, distended secondary to body habitus.  Bowel sounds present GU: Deferred Musculoskeletal: No contractures or cyanosis noted on the limited skin evaluation.  No joint deformities noted Skin: Skin is warm and dry and does have some lower extremity skin changes with some erythema consistent with venous stasis and chronic leg swelling Neurologic: Cranial nerves II through XII gross intact and is more responsive and does answer my questions. Psychiatric: He is more awake and alert today and he is oriented x2.  Data Reviewed: I have personally reviewed following labs and imaging studies  CBC: Recent Labs  Lab 07/03/19 1121 07/03/19 1613 07/04/19 0434 07/05/19 0329 07/06/19 0546  WBC 11.0*  --  8.8 7.8 8.5  NEUTROABS 8.4*  --   --  5.8 6.0  HGB 13.2 11.9* 11.0* 11.2* 12.3*  HCT 41.7 35.0* 35.8* 37.3* 40.8  MCV 69.5*  --  70.5* 70.6* 71.5*  PLT 244  --  216 200 546   Basic Metabolic Panel: Recent Labs  Lab 07/03/19 1121 07/03/19 1613 07/04/19 0434 07/05/19 0329 07/06/19 0546  NA 134* 135 137 141 141  K 2.8* 2.9* 3.5 3.0* 3.4*  CL 83*  --  92* 95* 97*  CO2 36*  --  31 32 32  GLUCOSE 99  --  132* 133* 180*  BUN 51*  --  41* 26* 18  CREATININE 2.80*  --  2.12* 1.59* 1.25*  CALCIUM 10.3  --  9.3 9.4 9.4  MG  --   --  1.6* 2.1 1.8  PHOS  --   --   --  1.7* 1.8*   GFR: Estimated Creatinine Clearance: 53.3 mL/min (A) (by C-G formula based on SCr of 1.25 mg/dL (H)). Liver Function Tests: Recent Labs  Lab 07/03/19 1121 07/05/19 0329 07/06/19 0546  AST 31 22 21   ALT 17 11 11   ALKPHOS 79 58 65  BILITOT 0.9 0.9 0.6  PROT 7.7 6.1* 6.6  ALBUMIN 3.9 2.8* 3.0*   Recent Labs  Lab 07/03/19 1121  LIPASE 46   Recent Labs  Lab 07/03/19 1156  AMMONIA 13   Coagulation Profile: No results for input(s): INR, PROTIME in the last 168 hours. Cardiac Enzymes: No results for input(s): CKTOTAL, CKMB, CKMBINDEX, TROPONINI in the last 168 hours. BNP (last 3 results) No  results for input(s): PROBNP in the last 8760 hours. HbA1C: No results for input(s): HGBA1C in the last 72 hours. CBG: Recent Labs  Lab 07/05/19 0726 07/05/19 1152 07/05/19 1600 07/05/19 2311 07/06/19 0604  GLUCAP 153* 170* 177* 176* 164*   Lipid Profile: No results for input(s): CHOL, HDL, LDLCALC, TRIG, CHOLHDL, LDLDIRECT in the last 72 hours. Thyroid Function Tests: Recent Labs    07/03/19 1822  TSH 1.269   Anemia Panel: Recent Labs    07/06/19 0546  VITAMINB12 1,393*  FOLATE 4.8*  FERRITIN 130  TIBC 241*  IRON 57  RETICCTPCT 1.5   Sepsis Labs: No results for input(s): PROCALCITON, LATICACIDVEN in the last 168 hours.  Recent Results (from the past 240 hour(s))  SARS Coronavirus 2 [facility] order, Performed in [facility] hospital lab) Nasopharyngeal Nasopharyngeal Swab     Status: None   Collection Time: 07/03/19 11:22 AM   Specimen: Nasopharyngeal Swab  Result Value Ref Range Status   SARS Coronavirus 2 NEGATIVE NEGATIVE Final    Comment: (NOTE) If result is NEGATIVE SARS-CoV-2 target nucleic acids are NOT DETECTED. The SARS-CoV-2 RNA is generally detectable in upper and lower  respiratory specimens during the acute phase of infection. The lowest  concentration of SARS-CoV-2 viral copies this assay can detect is 250  copies / mL. A negative result does not preclude SARS-CoV-2 infection  and should not be used as the sole basis for treatment or other  patient management decisions.  A negative result may occur with  improper specimen collection / handling, submission of specimen other  than nasopharyngeal swab, presence of viral mutation(s) within the  areas targeted by this assay, and inadequate number of viral copies  (<250 copies / mL). A negative result must be combined with clinical  observations, patient history, and epidemiological information. If result is POSITIVE SARS-CoV-2 target nucleic acids are DETECTED. The SARS-CoV-2 RNA is generally  detectable in upper and lower  respiratory specimens dur ing the acute phase of infection.  Positive  results are indicative of active infection with SARS-CoV-2.  Clinical  correlation with patient history and other diagnostic information is  necessary to determine patient infection status.  Positive results do  not rule out bacterial infection or co-infection with other viruses. If result is PRESUMPTIVE POSTIVE SARS-CoV-2 nucleic acids MAY BE PRESENT.   A presumptive positive result was obtained on the submitted specimen  and confirmed on repeat testing.  While 2019 novel coronavirus  (SARS-CoV-2) nucleic acids may be present in the submitted sample  additional confirmatory testing may be necessary for epidemiological  and / or clinical management purposes  to differentiate between  SARS-CoV-2 and other Sarbecovirus currently known to infect humans.  If clinically indicated additional testing with an alternate test  methodology [phone]) is advised. The SARS-CoV-2 RNA is generally  detectable in upper and lower respiratory sp ecimens during the acute  phase of infection. The expected result is Negative. Fact Sheet for Patients:  StrictlyIdeas.no Fact Sheet for Healthcare Providers: BankingDealers.co.za This test is not yet approved or cleared by the [place] FDA and has been authorized for detection and/or diagnosis of SARS-CoV-2 by FDA under an Emergency Use Authorization (EUA).  This EUA will remain in effect (meaning this test can be used) for the duration of the COVID-19 declaration under Section 564(b)(1) of the Act, 21 U.S.C. section 360bbb-3(b)(1), unless the authorization is terminated or revoked sooner. Performed at [facility], [place] [address], [place], [place] [address]   Urine culture     Status: Abnormal (Preliminary result)   Collection Time: 07/03/19  1:30 PM   Specimen: Urine, Random  Result Value Ref Range  Status   Specimen Description URINE, RANDOM  Final   Special Requests NONE  Final   Culture (A)  Final    >=100,000 COLONIES/mL ESCHERICHIA COLI CULTURE REINCUBATED FOR BETTER GROWTH Performed at [facility], [place] [address] [place], [place] [address]    Report Status PENDING  Incomplete   Organism ID, Bacteria ESCHERICHIA COLI (A)  Final      Susceptibility   Escherichia coli - MIC*    AMPICILLIN 8 SENSITIVE Sensitive     CEFAZOLIN <=4 SENSITIVE Sensitive     CEFTRIAXONE <=1 SENSITIVE Sensitive     CIPROFLOXACIN <=0.25 SENSITIVE Sensitive     GENTAMICIN <=1 SENSITIVE Sensitive     IMIPENEM <=0.25 SENSITIVE Sensitive     NITROFURANTOIN <=16 SENSITIVE Sensitive     TRIMETH/SULFA <=20 SENSITIVE Sensitive     AMPICILLIN/SULBACTAM 4 SENSITIVE Sensitive     PIP/TAZO <=4 SENSITIVE Sensitive     Extended ESBL NEGATIVE Sensitive     * >=100,000 COLONIES/mL ESCHERICHIA COLI  Culture, blood (routine x 2)     Status: None (Preliminary result)   Collection Time: 07/04/19  9:25 AM   Specimen: BLOOD  Result Value Ref Range Status   Specimen Description BLOOD LEFT ANTECUBITAL  Final   Special Requests AEROBIC BOTTLE ONLY Blood Culture adequate volume  Final   Culture   Final    NO GROWTH 2 DAYS Performed at [facility], [address] [place], [place] [address]    Report Status PENDING  Incomplete  Culture, blood (routine x 2)     Status: None (Preliminary result)   Collection Time: 07/04/19  9:25 AM   Specimen: BLOOD  Result Value Ref Range Status   Specimen Description BLOOD LEFT  ANTECUBITAL  Final   Special Requests AEROBIC BOTTLE ONLY Blood Culture adequate volume  Final   Culture   Final    NO GROWTH 2 DAYS Performed at [facility], [place] [address] [place], [place] [address]    Report Status PENDING  Incomplete  Culture, Urine     Status: Abnormal   Collection Time: 07/04/19 10:46 AM   Specimen: Urine, Random  Result Value Ref Range Status   Specimen  Description URINE, RANDOM  Final   Special Requests NONE  Final   Culture (A)  Final    <10,000 COLONIES/mL INSIGNIFICANT GROWTH Performed at [facility], [place] [address], [place],  [address]    Report Status 07/05/2019 FINAL  Final    Radiology Studies: No results found. Scheduled Meds: . antiseptic oral rinse  15 mL Mouth Rinse TID  . heparin  5,000 Units Subcutaneous Q8H  . sodium chloride flush  3 mL Intravenous Q12H   Continuous Infusions: . sodium chloride 75 mL/hr at 07/06/19 0551  . sodium chloride 10 mL/hr at 07/04/19 0500  . cefTRIAXone (ROCEPHIN)  IV 1 g (07/05/19 1630)  . potassium PHOSPHATE IVPB (in mmol) 20 mmol (07/06/19 0930)    LOS: 3 days   Kerney Elbe, DO [facility] PAGER is on [place]  If 7PM-7AM, please contact night-coverage [email or web address] Password [facility] 07/06/2019, 12:07 PM

## 2019-07-06 NOTE — Consult Note (Signed)
Joiner Nurse wound consult note Reason for Consult: moisture associated skin damage to buttock at gluteal cleft and scrotum. Patient likes HOB at or above the 30 degree angle, placing his coccyx at greater risk for pressure injury. Wound type:moisture plus friction Pressure Injury POA: Yes/No/NA Measurement: 3cm x 1.5cm x 0.1cm "shaved" area (from friction) Wound bed: shiny, dry Drainage (amount, consistency, odor) none Periwound: erythematous, macerated Dressing procedure/placement/frequency: Patient had been having fecal incontinence PTA and there is some bowel residue on the skin at the time of my assessment.  I will implement a care plan consisting of a mattress replacement with low air loss feature, a pressure redistribution chair pad for when patient is OOB to chair, three times daily care to the buttocks, perineal area and scrotum using Gerhart's Butt Cream (a 1:1:1 compounded product consisting of hydrocortisone, lotrimin and zinc oxide). Prevalon Boots are provided to prevent pressure injury to the heels and correct lateral rotation.  [place] nursing team will not follow, but will remain available to this patient, the nursing and medical teams.  Please re-consult if needed. Thanks, Maudie Flakes, MSN, RN, [place], Arther Abbott  Pager# [phone]

## 2019-07-07 ENCOUNTER — Inpatient Hospital Stay (HOSPITAL_COMMUNITY): Payer: Medicare Other

## 2019-07-07 LAB — PHOSPHORUS: Phosphorus: 2.1 mg/dL — ABNORMAL LOW (ref 2.5–4.6)

## 2019-07-07 LAB — CBC WITH DIFFERENTIAL/PLATELET
Abs Immature Granulocytes: 0.03 10*3/uL (ref 0.00–0.07)
Basophils Absolute: 0 10*3/uL (ref 0.0–0.1)
Basophils Relative: 0 %
Eosinophils Absolute: 0.3 10*3/uL (ref 0.0–0.5)
Eosinophils Relative: 3 %
HCT: 43.3 % (ref 39.0–52.0)
Hemoglobin: 12.9 g/dL — ABNORMAL LOW (ref 13.0–17.0)
Immature Granulocytes: 0 %
Lymphocytes Relative: 15 %
Lymphs Abs: 1.3 10*3/uL (ref 0.7–4.0)
MCH: 21.2 pg — ABNORMAL LOW (ref 26.0–34.0)
MCHC: 29.8 g/dL — ABNORMAL LOW (ref 30.0–36.0)
MCV: 71.2 fL — ABNORMAL LOW (ref 80.0–100.0)
Monocytes Absolute: 0.9 10*3/uL (ref 0.1–1.0)
Monocytes Relative: 10 %
Neutro Abs: 6.1 10*3/uL (ref 1.7–7.7)
Neutrophils Relative %: 72 %
Platelets: 213 10*3/uL (ref 150–400)
RBC: 6.08 MIL/uL — ABNORMAL HIGH (ref 4.22–5.81)
RDW: 15.9 % — ABNORMAL HIGH (ref 11.5–15.5)
WBC: 8.5 10*3/uL (ref 4.0–10.5)
nRBC: 0 % (ref 0.0–0.2)

## 2019-07-07 LAB — COMPREHENSIVE METABOLIC PANEL
ALT: 15 U/L (ref 0–44)
AST: 23 U/L (ref 15–41)
Albumin: 3.2 g/dL — ABNORMAL LOW (ref 3.5–5.0)
Alkaline Phosphatase: 69 U/L (ref 38–126)
Anion gap: 16 — ABNORMAL HIGH (ref 5–15)
BUN: 12 mg/dL (ref 8–23)
CO2: 28 mmol/L (ref 22–32)
Calcium: 9.5 mg/dL (ref 8.9–10.3)
Chloride: 97 mmol/L — ABNORMAL LOW (ref 98–111)
Creatinine, Ser: 1.1 mg/dL (ref 0.61–1.24)
GFR calc Af Amer: >60 mL/min (ref >60)
GFR calc non Af Amer: >60 mL/min (ref >60)
Glucose, Bld: 163 mg/dL — ABNORMAL HIGH (ref 70–99)
Potassium: 3.7 mmol/L (ref 3.5–5.1)
Sodium: 141 mmol/L (ref 135–145)
Total Bilirubin: 0.7 mg/dL (ref 0.3–1.2)
Total Protein: 6.8 g/dL (ref 6.5–8.1)

## 2019-07-07 LAB — GLUCOSE, CAPILLARY
Glucose-Capillary: 144 mg/dL — ABNORMAL HIGH (ref 70–99)
Glucose-Capillary: 157 mg/dL — ABNORMAL HIGH (ref 70–99)
Glucose-Capillary: 140 mg/dL — ABNORMAL HIGH (ref 70–99)

## 2019-07-07 LAB — MAGNESIUM: Magnesium: 1.7 mg/dL (ref 1.7–2.4)

## 2019-07-07 MED ORDER — K PHOS MONO-SOD PHOS DI & MONO 155-852-130 MG PO TABS
500.0000 mg | ORAL_TABLET | Freq: Two times a day (BID) | ORAL | Status: AC
  Administered 2019-07-07 (×2): 500 mg via ORAL
  Filled 2019-07-07 (×2): qty 2

## 2019-07-07 MED ORDER — IOHEXOL 300 MG/ML  SOLN
100.0000 mL | Freq: Once | INTRAMUSCULAR | Status: AC | PRN
  Administered 2019-07-07: 22:00:00 100 mL via INTRAVENOUS

## 2019-07-07 MED ORDER — MAGNESIUM SULFATE 2 GM/50ML IV SOLN
2.0000 g | Freq: Once | INTRAVENOUS | Status: AC
  Administered 2019-07-07: 2 g via INTRAVENOUS
  Filled 2019-07-07: qty 50

## 2019-07-07 NOTE — Care Management Important Message (Signed)
Important Message  Patient Details  Name: Jared Hall MRN: [id number] Date of Birth: 1931-01-05   Medicare Important Message Given:  Yes     Barbera Perritt Montine Circle 07/07/2019, 2:38 PM

## 2019-07-07 NOTE — Progress Notes (Signed)
  Speech Language Pathology Treatment: Dysphagia  Patient Details Name: Jared Hall MRN: [id number] DOB: 1931/07/10 Today's Date: 07/07/2019 Time: 6734-1937 SLP Time Calculation (min) (ACUTE ONLY): 8 min  Assessment / Plan / Recommendation Clinical Impression  F/u after 8/14 swallow assessment.  Pt has had fluctuating dysphagia for several years, shifting between thin and nectar-thick liquids with decline in function associated with acute medical admissions.  Today, his mentation appears to be improved from most recent evaluation, however, he is refusing to eat from breakfast tray this morning, which sits on his bedside table untouched. With encouragement, he was willing to drink ice water, and consumed at least two ounces with no notable concerns, then declined anything further.  Recommend continuing current diet of dysphagia 1, nectars for now, but allow regular/thin water between meals.  D/W RN.  SLP will follow.   HPI HPI: Jared Hall is a 85 y.o. male with medical history significant of hypertension, hyperlipidemia, diabetes mellitus type 2, history of stroke, paroxysmal atrial fibrillation not on anticoagulation, OSA, and alpha thalassemia; who presented from the nursing facility initially with reported complaints of chest pain.  Reports note that chest pain resolved prior to EMS arrival.  History is limited however as patient found to be altered and hard to arouse currently.  He was unable to answer questions at that time.  He was found to have acute ecephalopathy, UTI and acute renal failure.  Chest xray showing unchanged appearance of the chest from 7/13 with LLL  atelectasis and possible pleural effusion.      SLP Plan  Continue with current plan of care       Recommendations  Diet recommendations: Dysphagia 1 (puree);Nectar-thick liquid Liquids provided via: Straw;Cup Medication Administration: Crushed with puree Supervision: Full supervision/cueing for compensatory  strategies Compensations: Minimize environmental distractions;Slow rate Postural Changes and/or Swallow Maneuvers: Seated upright 90 degrees                Oral Care Recommendations: Oral care BID Follow up Recommendations: Skilled Nursing facility SLP Visit Diagnosis: Dysphagia, unspecified (R13.10) Plan: Continue with current plan of care       Jared Hall. Jared Hall, Jared Hall Office number [phone] Pager [phone]  Jared Hall 07/07/2019, 9:34 AM

## 2019-07-07 NOTE — Progress Notes (Signed)
PROGRESS NOTE    Jared Hall  ZOX:[id number] DOB: 09-04-1931 DOA: 07/03/2019 PCP: Josetta Huddle, MD  Brief Narrative:  HPI per Dr. Fuller Plan on 07/03/2019 Jared Hall is a 85 y.o. male with medical history significant of hypertension, hyperlipidemia, diabetes mellitus type 2, history of stroke, paroxysmal atrial fibrillation not on anticoagulation, OSA, and alpha thalassemia; who presented from the nursing facility initially with reported complaints of chest pain.  Reports note that chest pain resolved prior to EMS arrival.  History is limited however as patient found to be altered and hard to arouse currently.  Cannot get him to answer questions at this time.   ED Course: Upon admission into the emergency department patient was noted to be afebrile, pulse 73-88, respirations 15-25, blood pressures 142 138/103, and O2 saturations maintained on room air.  Labs revealed WBC 11, sodium 134, potassium 2.8, chloride 83, CO2 36, BUN 55, and creatinine 2.8.  COVID-19 testing negative. Urinalysis positive for small hemoglobin, large leukocytes, many bacteria, and greater than 50 WBCs.  Patient was given 500 mL of normal saline, 40 mEq of potassium chloride IV, and Rocephin.  Nursing staff while in the ED noted heart rates going down into the 30s with appearance of first-degree heart block.  **Interim History  Patient remained drowsy and encephalopathic slowly improving today he is more alert and able to tell me his name and was more interactive.  Cardiology checking echocardiogram and showed an EF of 60 to 65%.  Bradycardia is resolved.  Patient is urinary tract infection is being treated culture showed no growth to date at 3 days.  He is improving slowly and renal function is appropriately started down but we will stop IV fluids currently given his diastolic dysfunction.  Patient was complaining of significant abdominal pain today on palpation and nurse states that he is not been tolerating his p.o. and  did not want to eat.  We will repeat a CT scan of the abdomen pelvis without contrast given his recent renal dysfunction we will continue to monitor as he had gastric outlet obstruction last admission and underwent EGD.  Assessment & Plan:   Principal Problem:   Acute encephalopathy Active Problems:   DM (diabetes mellitus) (HCC)   Hypokalemia   Leukocytosis   Acute renal failure superimposed on chronic kidney disease (HCC)   Chest pain   Urinary tract infection  Acute Encephalopathy slowly improving -Patient presented from the nursing facility after complaining of chest pain and was found to be lethargic and somnolent; not as somnolent and states that he is feeling much better since coming in -Admitted to a progressive bed -C/w Neurochecks  -N.p.o. for now and obtain SLP evaluation; SLP recommending dysphagia 1 diet with nectar thick liquids we will continue now -Aspiration precautions -Check ABG  and showed a pH of 7.400, PCO2 of 57.3, PO2 139.0, bicarbonate level 35.5, and O2 saturation 99% -Will need PT/OT Evaluation are recommending skilled nursing facility -Will place on BiPAP( if needed) -Check blood cultures x2 no growth to at 2 days and follow urine culture; initial one showed greater than 100,000 colony-forming units of E. coli and repeat showed less than 10,000 colonies of insignificant growth; initial urinalysis shows that the patient's E. coli is pansensitive and will continue current antibiotics until he is fully taken p.o. -Chest x-ray showed "Unchanged appearance of the chest with LEFT LOWER lung atelectasis and possible small LEFT pleural effusion." -Follow-up CT scan of the Head showed "No acute intracranial abnormality. Atrophy,  chronic microvascular disease. Stable right parietal lobe and bilateral cerebellar chronic ischemic infarcts." -If not improving after treating the urinary tract infection will need to consult neurology for further evaluation but since he is  getting better we will hold off -RPR is pending and B12 level was 1393; TSH was 1.269 -Because the patient complained of some abdominal pain will obtain a CT abdomen and pelvis without contrast given his recent renal dysfunction  Acute Renal Failure superimposed on chronic kidney disease stage III Contraction alkalosis  -Patient's baseline creatinine previously noted to be around 1.2 last, but presents with creatinine elevated up to 2.8 with BUN 51.   -C/w Strict intake and output and Monitor OUP -Chloride was 83 and CO2 was 36 on admission; now chloride is 95 and CO2 is 32 -BUN/creatinine is now improved and is now 12/1.10 -Check urine creatinine, urine urea; Urine Cr was 48.88 -IVF Hydration is now stopped  -Continue to Hold diuretics -Avoid nephrotoxic medications, contrast dyes, hypotension if possible -Continue to monitor and trend renal function -Repeat CMP in a.m.  Chest Pain improved -Troponin is mildly elevated at 26 and 28 and is not consistent with ACS -Had a cardiac catheterization in 2008 with normal coronaries  -EKG with Q waves in the inferior and anterior leads.  Patient with previous history of negative cardiac cath in 2018. -Follow-up repeat echo per Cardiology and this is supposed to be done today  -Appreciate Cardiology Evaluation and Further Recommendations -They do not feel this consistent with an ACS process  Bradycardia with PVCs and PACs and first-degree AV block -Continue to hold metoprolol and any other AV nodal blocking agents for now next-check echocardiogram -Replete electrolytes and potassium is 3.7 and magnesium is 1.7 and currently being repleted -Continue to monitor on telemetry as bradycardia has resolved  E Coli Urinary Tract Infection -Acute.  Could be from the diarrhea the patient was having prior to admission as apparently he was sitting in his own stool PTA   -Urinalysis positive for signs of infection as showed cloudy appearance, small  hemoglobin, large leukocytes, many bacteria, 0-5 squamous epithelial cells and greater than 50 WBCs.   -Initial urine culture growing greater than 100,000 colonies of E. coli and repeat was showing less than 10,000 colonies of insignificant growth -Patient was started on Rocephin IV. -Blood cultures x2 showed no growth to date at 3 Days -Continue Rocephin IV for now until sensitivities result  Leukocytosis, improved -Acute. Initial WBC 11 on admission and repeat this AM was 8.5  -Suspect this could be secondary to the urinary tract infection. -Continue to Monitor and Repeat CBC in AM   Hypokalemia -Acute.On admission potassium noted to be 2.8 on Admision.   -Replete with IV potassium chloride 30 mEq along with IV potassium phosphate 20 mmol yesterday; currently potassium is now 3.7 -Checked Magnesium level was 1.7 and replete that as well -Continue to monitor and Replete as Necessary -Repeat CMP in AM  Hypomagnesemia -Patient's magnesium level this morning is 1.7 -Replete with IV mag sulfate 2 g -Continue monitor replete as necessary -Repeat Magnesium level in the a.m.  Hypophosphatemia -Patient's phosphorus level this morning was 2.1 -Replete with IV K-Phos 20 mmol yesterday and will replete with p.o. K Phos Neutral 500 mg p.o. twice daily x2 doses -Continue monitor and replete as necessary -Repeat phosphorus level in a.m.  Diastolic CHF -Patient with 1+ pitting edema of the bilateral lower extremities but is not volume overloaded.  -No significant JVD appreciated.  Last EF  noted to be 55 to 60% with grade 1 diastolic dysfunction in 07/3817.;  Repeat echo showed 60 to 65% and consistent with diastolic impairment again -Strict I's/O's and Daily Weights; + 4.113 L since admission -Metoprolol Tartrate 25 mg po BID currently being held due to bradycardia and cardiology continues to recommend to hold it for now and follow up with Dr. Radford Pax to resume  -Continue to Hold Furosemide 80  mg BID and Metolazone 5 mg po Daily for now   -Continue to Monitor Volume Status Carefully    Diabetes Mellitus Type 2 -Hemoglobin A1c noted to be 8.2 on 06/08/2019.  Patient currently altered and n.p.o. therefore we will hold IV insulin at this time. -C/w Hypoglycemic protocol -CBGs every 6 hours -Restart insulin when tolerating a diet; Takes Lantus 10 units sq Daily andSS with Novolog  -Has not consistently been taking p.o. intake properly per nursing -CBGs have been ranging from 144-187; blood sugar on this a.m.'s CMP was 163  Essential Hypertension -Restart home blood pressure medications when medically appropriate -Was on metoprolol Tartrate 25 mg po BID but being held due to Bradycardia -Holding Metolazone 5 mg po Daily and Furosemide 80 mg po BID currently given his kidney function was worse and is now improving  History of Stroke -Continue Aspirin 81 mg po Daily, Simvastatin 20 mg po Daily, and Clopidogrel 75 mg po Daily when able  History of prostate cancer s/p radiation treatment BPH -Continue to Monitor Volume Output   Microcytic Anemia and history of alpha thalassemia -Likely was hemoconcentrated on admission and has a dilutional drop now -Patient's hemoglobin/hematocrit went from 13.2/41.7 is now 12.9/43.3 -Checked Anemia panel showed an iron level of 57, U IBC 184, TIBC of 241, saturation ratios of 24%, ferritin level 130, folate level 4.8, and vitamin B12 level of 1393 -Continue to monitor for signs and symptoms of bleeding; currently no overt bleeding noted -Repeat CBC in the a.m.  Hyponatremia/Hypochloremia, improving -In the setting of diuresis  -Improved with IV fluid resuscitation -Patient sodium went from 134 and is now 141 -Chloride is improving and went from 83 is now 97 -Continue to monitor and trend -Repeat CMP in the a.m.  Obesity -Estimated body mass index is 35.9 kg/m as calculated from the following:   Height as of 06/10/19: 5\' 10"  (1.778 m).    Weight as of this encounter: 113.5 kg. -Not awake enough for Weight Loss and Dietary Counseling   Abdominal Pain and Recent Gastric Outlet Obstruction and ?Recent Diarrhea -Per report he had been having ? fecal incontinence prior to admission but has not had any bowel movements here -Check CT of the abdomen and pelvis without contrast given that he still complaining of some abdominal pain on palpation and because he is not taking p.o. properly and nursing states that he does not want to eat and this is unsure if it is secondary to abdominal pain -He was hospitalized and discharged on 06/14/2019 and felt to have had gastric outlet obstruction -Underwent EGD on 721 without findings of mass or lesion to explain obstruction -He was placed on soft diet at that time -Continue to monitor as patient was having significant abdominal pain and will likely need to discuss with GI again pending his results of his CT of the abdomen -Recently had constipation and had to be given a soapsuds enema  DVT prophylaxis: Heparin 5,000 units sq q8h Code Status: DO NOT RESUSCITATE  Family Communication: No family present at bedside  Disposition Plan: [facility]  for continued workup and treatment and DC to SNF when medically stable; will repeat CT of the abdomen pelvis without contrast  Consultants:   Cardiology Dr. Kelly/Dr. Debara Pickett   Procedures:  Echocardiogram IMPRESSIONS    1. The left ventricle has normal systolic function with an ejection fraction of 60-65%. The cavity size was normal. There is severe asymmetric left ventricular hypertrophy. Left ventricular diastolic Doppler parameters are consistent with impaired  relaxation. No evidence of left ventricular regional wall motion abnormalities.  2. The right ventricle has normal systolic function. The cavity was normal. There is no increase in right ventricular wall thickness. Right ventricular systolic pressure could not be assessed.  3. There is  moderate mitral annular calcification present.  4. The aortic valve is tricuspid. Severely thickening of the aortic valve. Severe calcifcation of the aortic valve. Aortic valve regurgitation is trivial by color flow Doppler.  5. The aorta is abnormal unless otherwise noted.  6. There is mild dilatation of the aortic root measuring 40 mm.  7. The interatrial septum appears to be lipomatous.  FINDINGS  Left Ventricle: The left ventricle has normal systolic function, with an ejection fraction of 60-65%. The cavity size was normal. There is severe asymmetric left ventricular hypertrophy. Left ventricular diastolic Doppler parameters are consistent with  impaired relaxation. Normal left ventricular filling pressures No evidence of left ventricular regional wall motion abnormalities..  Right Ventricle: The right ventricle has normal systolic function. The cavity was normal. There is no increase in right ventricular wall thickness. Right ventricular systolic pressure could not be assessed.  Left Atrium: Left atrial size was normal in size.  Right Atrium: Right atrial size was normal in size. Right atrial pressure is estimated at 3 mmHg.  Interatrial Septum: No atrial level shunt detected by color flow Doppler. Increased thickness of the atrial septum sparing the fossa ovalis consistent with The interatrial septum appears to be lipomatous.  Pericardium: There is no evidence of pericardial effusion.  Mitral Valve: The mitral valve is normal in structure. There is moderate mitral annular calcification present. Mitral valve regurgitation was not assessed by color flow Doppler.  Tricuspid Valve: The tricuspid valve is normal in structure. Tricuspid valve regurgitation was not visualized by color flow Doppler.  Aortic Valve: The aortic valve is tricuspid Severely thickening of the aortic valve. Severe calcifcation of the aortic valve. Aortic valve regurgitation is trivial by color flow  Doppler.  Pulmonic Valve: The pulmonic valve was normal in structure. Pulmonic valve regurgitation is not visualized by color flow Doppler.  Aorta: The aorta is abnormal unless otherwise noted. There is mild dilatation of the aortic root measuring 40 mm.  Venous: The inferior vena cava measures 1.79 cm, is normal in size with greater than 50% respiratory variability.    +--------------+--------++  LEFT VENTRICLE            +----------------+---------++ +--------------+--------++  Diastology                    PLAX 2D                   +----------------+---------++ +--------------+--------++  LV e' lateral:   5.77 cm/s    LVIDd:         4.89 cm    +----------------+---------++ +--------------+--------++  LV E/e' lateral: 14.1         LVIDs:         3.86 cm    +----------------+---------++ +--------------+--------++  LV PW:  1.02 cm    +--------------+--------++  LV IVS:        1.59 cm    +--------------+--------++  LVOT diam:     2.30 cm    +--------------+--------++  LV SV:         48 ml      +--------------+--------++  LV SV Index:   20.24      +--------------+--------++  LVOT Area:     4.15 cm   +--------------+--------++                            +--------------+--------++  +---------------+----------++  RIGHT VENTRICLE              +---------------+----------++  RV Basal diam:  2.81 cm      +---------------+----------++  RV S prime:     12.30 cm/s   +---------------+----------++  TAPSE (M-mode): 1.7 cm       +---------------+----------++  +---------------+-------++-----------++  LEFT ATRIUM              Index         +---------------+-------++-----------++  LA diam:        4.20 cm  1.85 cm/m    +---------------+-------++-----------++  LA Vol (A2C):   81.3 ml  35.89 ml/m   +---------------+-------++-----------++  LA Vol (A4C):   57.3 ml  25.29 ml/m   +---------------+-------++-----------++  LA Biplane Vol: 69.5 ml  30.68  ml/m   +---------------+-------++-----------++ +------------+---------++-----------++  RIGHT ATRIUM            Index         +------------+---------++-----------++  RA Pressure: 3.00 mmHg                +------------+---------++-----------++  RA Area:     15.80 cm                +------------+---------++-----------++  RA Volume:   36.90 ml   16.29 ml/m   +------------+---------++-----------++  +------------+-----------++  AORTIC VALVE               +------------+-----------++  LVOT Vmax:   82.50 cm/s    +------------+-----------++  LVOT Vmean:  53.700 cm/s   +------------+-----------++  LVOT VTI:    0.164 m       +------------+-----------++  AR PHT:      312 msec      +------------+-----------++   +-------------+-------++  AORTA                   +-------------+-------++  Ao Root diam: 4.00 cm   +-------------+-------++  Ao Asc diam:  3.65 cm   +-------------+-------++  +--------------+----------++  +---------------+---------++  MITRAL VALVE                  TRICUSPID VALVE             +--------------+----------++  +---------------+---------++  MV Area (PHT): 2.48 cm       Estimated RAP:  3.00 mmHg   +--------------+----------++  +---------------+---------++  MV PHT:        88.74 msec   +--------------+----------++  +--------------+-------+  MV Decel Time: 306 msec       SHUNTS                  +--------------+----------++  +--------------+-------+ +--------------+-----------++  Systemic VTI:  0.16 m    MV E velocity: 81.20 cm/s    +--------------+-------+ +--------------+-----------++  Systemic Diam: 2.30 cm   MV A velocity: 135.00 cm/s   +--------------+-------+ +--------------+-----------++  MV E/A ratio:  0.60          +--------------+-----------++  +---------+-------+  IVC                +---------+-------+  IVC diam: 1.79 cm  +---------+-------+    Antimicrobials:  Anti-infectives (From admission, onward)   Start     Dose/Rate Route Frequency  Ordered Stop   07/04/19 1500  cefTRIAXone (ROCEPHIN) 1 g in sodium chloride 0.9 % 100 mL IVPB     1 g 200 mL/hr over 30 Minutes Intravenous Every 24 hours 07/03/19 1557     07/03/19 1445  cefTRIAXone (ROCEPHIN) 1 g in sodium chloride 0.9 % 100 mL IVPB     1 g 200 mL/hr over 30 Minutes Intravenous  Once 07/03/19 1430 07/03/19 1838     Subjective: Seen and examined at bedside and is awake and alert and oriented to himself and states that he is complaining of abdominal pain.  Nurse states that he is not been eating and the patient states that he is "not hungry".  Had significant amount of pain on palpation so will obtain a CT of the abdomen pelvis without contrast.  Patient denies any nausea or vomiting and has not had any bowel movements.  No other concerns or complaints at this time.  Objective: Vitals:   07/06/19 2315 07/07/19 0354 07/07/19 0802 07/07/19 1137  BP: (!) 141/64 137/76 126/72 134/80  Pulse: 83 88 86 88  Resp: 18 20 17 19   Temp: 98 F (36.7 C) 97.9 F (36.6 C) (!) 97.4 F (36.3 C) (!) 97.3 F (36.3 C)  TempSrc: Oral Oral Oral Oral  SpO2: 98% 97% 99% 99%  Weight:  113.5 kg      Intake/Output Summary (Last 24 hours) at 07/07/2019 1241 Last data filed at 07/07/2019 0355 Gross per 24 hour  Intake 1017.31 ml  Output 800 ml  Net 217.31 ml   Filed Weights   07/06/19 0352 07/07/19 0354  Weight: 112.5 kg 113.5 kg    Examination: Physical Exam:  Constitutional: Well-nourished, well-developed obese Caucasian male currently no acute distress is more awake today but he does complain of some abdominal pain specifically on palpation. Eyes: Lids and conjunctive are normal.  Sclera and neck ENMT: External ears and nose appear normal.  Grossly normal hearing Neck: Appears supple no JVD Respiratory: Diminished auscultation bilaterally no patient wheezing, rales, rhonchi.  Patient not tachypneic wheezing and accessory muscle Cardiovascular: Regular rate and rhythm.  No  appreciable murmurs, rubs, gallops.  Has 1+ lower extremity bilaterally Abdomen: Soft, moderately tender to palpate.  Distended secondary body habitus.  Bowel sounds present GU: Deferred Musculoskeletal: No contractures or cyanosis on limited skin evaluation.  No joint deformity noted Skin: Skin is warm and dry no appreciable rashes or lesions but does have lower extremity skin changes with some erythema consistent with venous stasis and chronic leg swelling; unable to view sacrum is a difficult return patient Neurologic: Cranial nerves II through XII grossly intact no appreciable focal deficits. Psychiatric: Awake and alert and is oriented x2.  Has a pleasant mood and affect  Data Reviewed: I have personally reviewed following labs and imaging studies  CBC: Recent Labs  Lab 07/03/19 1121 07/03/19 1613 07/04/19 0434 07/05/19 0329 07/06/19 0546 07/07/19 1008  WBC 11.0*  --  8.8 7.8 8.5 8.5  NEUTROABS 8.4*  --   --  5.8 6.0 6.1  HGB 13.2 11.9* 11.0* 11.2* 12.3* 12.9*  HCT 41.7 35.0* 35.8* 37.3* 40.8 43.3  MCV 69.5*  --  70.5* 70.6* 71.5* 71.2*  PLT 244  --  216 200 175 761   Basic Metabolic Panel: Recent Labs  Lab 07/03/19 1121 07/03/19 1613 07/04/19 0434 07/05/19 0329 07/06/19 0546 07/07/19 1008  NA 134* 135 137 141 141 141  K 2.8* 2.9* 3.5 3.0* 3.4* 3.7  CL 83*  --  92* 95* 97* 97*  CO2 36*  --  31 32 32 28  GLUCOSE 99  --  132* 133* 180* 163*  BUN 51*  --  41* 26* 18 12  CREATININE 2.80*  --  2.12* 1.59* 1.25* 1.10  CALCIUM 10.3  --  9.3 9.4 9.4 9.5  MG  --   --  1.6* 2.1 1.8 1.7  PHOS  --   --   --  1.7* 1.8* 2.1*   GFR: Estimated Creatinine Clearance: 60.8 mL/min (by C-G formula based on SCr of 1.1 mg/dL). Liver Function Tests: Recent Labs  Lab 07/03/19 1121 07/05/19 0329 07/06/19 0546 07/07/19 1008  AST 31 22 21 23   ALT 17 11 11 15   ALKPHOS 79 58 65 69  BILITOT 0.9 0.9 0.6 0.7  PROT 7.7 6.1* 6.6 6.8  ALBUMIN 3.9 2.8* 3.0* 3.2*   Recent Labs  Lab  07/03/19 1121  LIPASE 46   Recent Labs  Lab 07/03/19 1156  AMMONIA 13   Coagulation Profile: No results for input(s): INR, PROTIME in the last 168 hours. Cardiac Enzymes: No results for input(s): CKTOTAL, CKMB, CKMBINDEX, TROPONINI in the last 168 hours. BNP (last 3 results) No results for input(s): PROBNP in the last 8760 hours. HbA1C: No results for input(s): HGBA1C in the last 72 hours. CBG: Recent Labs  Lab 07/05/19 2311 07/06/19 0604 07/06/19 1232 07/06/19 2312 07/07/19 0600  GLUCAP 176* 164* 187* 161* 144*   Lipid Profile: No results for input(s): CHOL, HDL, LDLCALC, TRIG, CHOLHDL, LDLDIRECT in the last 72 hours. Thyroid Function Tests: No results for input(s): TSH, T4TOTAL, FREET4, T3FREE, THYROIDAB in the last 72 hours. Anemia Panel: Recent Labs    07/06/19 0546  VITAMINB12 1,393*  FOLATE 4.8*  FERRITIN 130  TIBC 241*  IRON 57  RETICCTPCT 1.5   Sepsis Labs: No results for input(s): PROCALCITON, LATICACIDVEN in the last 168 hours.  Recent Results (from the past 240 hour(s))  SARS Coronavirus 2 [facility] order, Performed in [facility] hospital lab) Nasopharyngeal Nasopharyngeal Swab     Status: None   Collection Time: 07/03/19 11:22 AM   Specimen: Nasopharyngeal Swab  Result Value Ref Range Status   SARS Coronavirus 2 NEGATIVE NEGATIVE Final    Comment: (NOTE) If result is NEGATIVE SARS-CoV-2 target nucleic acids are NOT DETECTED. The SARS-CoV-2 RNA is generally detectable in upper and lower  respiratory specimens during the acute phase of infection. The lowest  concentration of SARS-CoV-2 viral copies this assay can detect is 250  copies / mL. A negative result does not preclude SARS-CoV-2 infection  and should not be used as the sole basis for treatment or other  patient management decisions.  A negative result may occur with  improper specimen collection / handling, submission of specimen other  than nasopharyngeal swab, presence of viral  mutation(s) within the  areas targeted by this assay, and inadequate number of viral copies  (<250 copies / mL). A negative result must be combined with clinical  observations, patient history, and epidemiological information. If result is POSITIVE SARS-CoV-2 target nucleic acids are DETECTED. The SARS-CoV-2 RNA is generally detectable in upper  and lower  respiratory specimens dur ing the acute phase of infection.  Positive  results are indicative of active infection with SARS-CoV-2.  Clinical  correlation with patient history and other diagnostic information is  necessary to determine patient infection status.  Positive results do  not rule out bacterial infection or co-infection with other viruses. If result is PRESUMPTIVE POSTIVE SARS-CoV-2 nucleic acids MAY BE PRESENT.   A presumptive positive result was obtained on the submitted specimen  and confirmed on repeat testing.  While 2019 novel coronavirus  (SARS-CoV-2) nucleic acids may be present in the submitted sample  additional confirmatory testing may be necessary for epidemiological  and / or clinical management purposes  to differentiate between  SARS-CoV-2 and other Sarbecovirus currently known to infect humans.  If clinically indicated additional testing with an alternate test  methodology [phone]) is advised. The SARS-CoV-2 RNA is generally  detectable in upper and lower respiratory sp ecimens during the acute  phase of infection. The expected result is Negative. Fact Sheet for Patients:  StrictlyIdeas.no Fact Sheet for Healthcare Providers: BankingDealers.co.za This test is not yet approved or cleared by the [place] FDA and has been authorized for detection and/or diagnosis of SARS-CoV-2 by FDA under an Emergency Use Authorization (EUA).  This EUA will remain in effect (meaning this test can be used) for the duration of the COVID-19 declaration under Section 564(b)(1)  of the Act, 21 U.S.C. section 360bbb-3(b)(1), unless the authorization is terminated or revoked sooner. Performed at [facility], [place] [address], [place], [place] [address]   Urine culture     Status: Abnormal (Preliminary result)   Collection Time: 07/03/19  1:30 PM   Specimen: Urine, Random  Result Value Ref Range Status   Specimen Description URINE, RANDOM  Final   Special Requests   Final    NONE Performed at [facility], [place] [address], [place], [place] [address]    Culture (A)  Final    >=100,000 COLONIES/mL ESCHERICHIA COLI 10,000 COLONIES/mL PROTEUS MIRABILIS    Report Status PENDING  Incomplete   Organism ID, Bacteria ESCHERICHIA COLI (A)  Final      Susceptibility   Escherichia coli - MIC*    AMPICILLIN 8 SENSITIVE Sensitive     CEFAZOLIN <=4 SENSITIVE Sensitive     CEFTRIAXONE <=1 SENSITIVE Sensitive     CIPROFLOXACIN <=0.25 SENSITIVE Sensitive     GENTAMICIN <=1 SENSITIVE Sensitive     IMIPENEM <=0.25 SENSITIVE Sensitive     NITROFURANTOIN <=16 SENSITIVE Sensitive     TRIMETH/SULFA <=20 SENSITIVE Sensitive     AMPICILLIN/SULBACTAM 4 SENSITIVE Sensitive     PIP/TAZO <=4 SENSITIVE Sensitive     Extended ESBL NEGATIVE Sensitive     * >=100,000 COLONIES/mL ESCHERICHIA COLI  Culture, blood (routine x 2)     Status: None (Preliminary result)   Collection Time: 07/04/19  9:25 AM   Specimen: BLOOD  Result Value Ref Range Status   Specimen Description BLOOD LEFT ANTECUBITAL  Final   Special Requests AEROBIC BOTTLE ONLY Blood Culture adequate volume  Final   Culture   Final    NO GROWTH 3 DAYS Performed at [facility], [place] [address], [place], [place] [address]    Report Status PENDING  Incomplete  Culture, blood (routine x 2)     Status: None (Preliminary result)   Collection Time: 07/04/19  9:25 AM   Specimen: BLOOD  Result Value Ref Range Status   Specimen Description BLOOD  LEFT ANTECUBITAL  Final   Special Requests AEROBIC BOTTLE ONLY  Blood Culture adequate volume  Final   Culture   Final    NO GROWTH 3 DAYS Performed at [facility], [address], [place], [place] [address]    Report Status PENDING  Incomplete  Culture, Urine     Status: Abnormal   Collection Time: 07/04/19 10:46 AM   Specimen: Urine, Random  Result Value Ref Range Status   Specimen Description URINE, RANDOM  Final   Special Requests NONE  Final   Culture (A)  Final    <10,000 COLONIES/mL INSIGNIFICANT GROWTH Performed at [facility], [place] [address], [place], [place] [address]    Report Status 07/05/2019 FINAL  Final    Radiology Studies: No results found. Scheduled Meds:  antiseptic oral rinse  15 mL Mouth Rinse TID   Gerhardt's butt cream   Topical TID   heparin  5,000 Units Subcutaneous Q8H   sodium chloride flush  3 mL Intravenous Q12H   Continuous Infusions:  sodium chloride 10 mL/hr at 07/04/19 0500   cefTRIAXone (ROCEPHIN)  IV 1 g (07/06/19 1649)    LOS: 4 days   Kerney Elbe, DO [facility] PAGER is on [place]  If 7PM-7AM, please contact night-coverage [email or web address] Password [facility] 07/07/2019, 12:41 PM

## 2019-07-08 DIAGNOSIS — R531 Weakness: Secondary | ICD-10-CM

## 2019-07-08 DIAGNOSIS — Z66 Do not resuscitate: Secondary | ICD-10-CM

## 2019-07-08 DIAGNOSIS — Z515 Encounter for palliative care: Secondary | ICD-10-CM

## 2019-07-08 LAB — URINE CULTURE: Culture: >=100000 — AB

## 2019-07-08 LAB — COMPREHENSIVE METABOLIC PANEL
ALT: 16 U/L (ref 0–44)
AST: 26 U/L (ref 15–41)
Albumin: 3 g/dL — ABNORMAL LOW (ref 3.5–5.0)
Alkaline Phosphatase: 65 U/L (ref 38–126)
Anion gap: 13 (ref 5–15)
BUN: 13 mg/dL (ref 8–23)
CO2: 28 mmol/L (ref 22–32)
Calcium: 9.6 mg/dL (ref 8.9–10.3)
Chloride: 99 mmol/L (ref 98–111)
Creatinine, Ser: 1.29 mg/dL — ABNORMAL HIGH (ref 0.61–1.24)
GFR calc Af Amer: 58 mL/min — ABNORMAL LOW (ref >60)
GFR calc non Af Amer: 50 mL/min — ABNORMAL LOW (ref >60)
Glucose, Bld: 178 mg/dL — ABNORMAL HIGH (ref 70–99)
Potassium: 4.7 mmol/L (ref 3.5–5.1)
Sodium: 140 mmol/L (ref 135–145)
Total Bilirubin: 1 mg/dL (ref 0.3–1.2)
Total Protein: 6.4 g/dL — ABNORMAL LOW (ref 6.5–8.1)

## 2019-07-08 LAB — CBC WITH DIFFERENTIAL/PLATELET
Abs Immature Granulocytes: 0.04 10*3/uL (ref 0.00–0.07)
Abs Immature Granulocytes: 0.05 10*3/uL (ref 0.00–0.07)
Basophils Absolute: 0 10*3/uL (ref 0.0–0.1)
Basophils Absolute: 0 10*3/uL (ref 0.0–0.1)
Basophils Relative: 0 %
Basophils Relative: 0 %
Eosinophils Absolute: 0.3 10*3/uL (ref 0.0–0.5)
Eosinophils Absolute: 0.2 10*3/uL (ref 0.0–0.5)
Eosinophils Relative: 3 %
Eosinophils Relative: 2 %
HCT: 40.3 % (ref 39.0–52.0)
HCT: 40.1 % (ref 39.0–52.0)
Hemoglobin: 12.3 g/dL — ABNORMAL LOW (ref 13.0–17.0)
Hemoglobin: 12.2 g/dL — ABNORMAL LOW (ref 13.0–17.0)
Immature Granulocytes: 0 %
Immature Granulocytes: 1 %
Lymphocytes Relative: 15 %
Lymphocytes Relative: 13 %
Lymphs Abs: 1.4 10*3/uL (ref 0.7–4.0)
Lymphs Abs: 1.3 10*3/uL (ref 0.7–4.0)
MCH: 21.4 pg — ABNORMAL LOW (ref 26.0–34.0)
MCH: 21.4 pg — ABNORMAL LOW (ref 26.0–34.0)
MCHC: 30.5 g/dL (ref 30.0–36.0)
MCHC: 30.4 g/dL (ref 30.0–36.0)
MCV: 70.2 fL — ABNORMAL LOW (ref 80.0–100.0)
MCV: 70.4 fL — ABNORMAL LOW (ref 80.0–100.0)
Monocytes Absolute: 0.9 10*3/uL (ref 0.1–1.0)
Monocytes Absolute: 1 10*3/uL (ref 0.1–1.0)
Monocytes Relative: 10 %
Monocytes Relative: 10 %
Neutro Abs: 6.7 10*3/uL (ref 1.7–7.7)
Neutro Abs: 7.3 10*3/uL (ref 1.7–7.7)
Neutrophils Relative %: 72 %
Neutrophils Relative %: 74 %
Platelets: 205 10*3/uL (ref 150–400)
Platelets: 192 10*3/uL (ref 150–400)
RBC: 5.74 MIL/uL (ref 4.22–5.81)
RBC: 5.7 MIL/uL (ref 4.22–5.81)
RDW: 16.2 % — ABNORMAL HIGH (ref 11.5–15.5)
RDW: 16.7 % — ABNORMAL HIGH (ref 11.5–15.5)
WBC: 9.4 10*3/uL (ref 4.0–10.5)
WBC: 9.8 10*3/uL (ref 4.0–10.5)
nRBC: 0 % (ref 0.0–0.2)
nRBC: 0 % (ref 0.0–0.2)

## 2019-07-08 LAB — PHOSPHORUS: Phosphorus: 2.2 mg/dL — ABNORMAL LOW (ref 2.5–4.6)

## 2019-07-08 LAB — GLUCOSE, CAPILLARY
Glucose-Capillary: 174 mg/dL — ABNORMAL HIGH (ref 70–99)
Glucose-Capillary: 213 mg/dL — ABNORMAL HIGH (ref 70–99)
Glucose-Capillary: 222 mg/dL — ABNORMAL HIGH (ref 70–99)
Glucose-Capillary: 307 mg/dL — ABNORMAL HIGH (ref 70–99)

## 2019-07-08 LAB — MAGNESIUM: Magnesium: 2.1 mg/dL (ref 1.7–2.4)

## 2019-07-08 LAB — RPR: RPR Ser Ql: NONREACTIVE

## 2019-07-08 MED ORDER — K PHOS MONO-SOD PHOS DI & MONO 155-852-130 MG PO TABS
500.0000 mg | ORAL_TABLET | Freq: Two times a day (BID) | ORAL | Status: AC
  Administered 2019-07-08 – 2019-07-09 (×2): 500 mg via ORAL
  Filled 2019-07-08 (×2): qty 2

## 2019-07-08 MED ORDER — RESOURCE THICKENUP CLEAR PO POWD
ORAL | Status: DC | PRN
  Filled 2019-07-08: qty 125

## 2019-07-08 MED ORDER — ENSURE ENLIVE PO LIQD
237.0000 mL | Freq: Three times a day (TID) | ORAL | Status: DC
  Administered 2019-07-08 – 2019-07-09 (×3): 237 mL via ORAL

## 2019-07-08 MED ORDER — INSULIN ASPART 100 UNIT/ML ~~LOC~~ SOLN
4.0000 [IU] | Freq: Once | SUBCUTANEOUS | Status: AC
  Administered 2019-07-09: 4 [IU] via SUBCUTANEOUS

## 2019-07-08 NOTE — Progress Notes (Signed)
Physical Therapy Treatment Patient Details Name: Jared Hall MRN: [id number] DOB: Mar 23, 1931 Today's Date: 07/08/2019    History of Present Illness 85 y.o. male with medical history significant of hypertension, hyperlipidemia, diabetes mellitus type 2, history of stroke, paroxysmal atrial fibrillation not on anticoagulation, OSA, and alpha thalassemia.  He presented from [facility] initially with reported complaints of chest pain.  Reports note that chest pain resolved prior to EMS arrival.  History is limited however as patient found to be altered and hard to arouse.    PT Comments    Patient seen for mobility progression. Pt is able to state he is in "[facility]" and perseverative on going back to [employer] throughout session. Pt agreeable to participating in therapy. Pt requires mod/max A (+2 for safety/assistance) for functional transfer training. Pt is able to stand with RW long enough for peri care. Continue to progress as tolerated with anticipated d/c to SNF for further skilled PT services.      Follow Up Recommendations  SNF     Equipment Recommendations  None recommended by PT    Recommendations for Other Services       Precautions / Restrictions Precautions Precautions: Fall Restrictions Weight Bearing Restrictions: No    Mobility  Bed Mobility Overal bed mobility: Needs Assistance Bed Mobility: Supine to Sit     Supine to sit: Mod assist;HOB elevated     General bed mobility comments: assist to bring hips to EOB and to elevate trunk all the way into sitting; use of rail and cues for sequencing necessary  Transfers Overall transfer level: Needs assistance Equipment used: None Transfers: Squat Pivot Transfers;Sit to/from Stand Sit to Stand: Mod assist   Squat pivot transfers: Max assist     General transfer comment: pt able to squat pivot to get into drop arm recliner however was not centered in chair so RW used for sit to stand to  reposition and for peri care in standing; assist to power up into standing; cues for sequencing   Ambulation/Gait             General Gait Details: non-ambulatory   Stairs             Wheelchair Mobility    Modified Rankin (Stroke Patients Only)       Balance Overall balance assessment: Needs assistance Sitting-balance support: Feet supported;Bilateral upper extremity supported Sitting balance-Leahy Scale: Fair     Standing balance support: Bilateral upper extremity supported Standing balance-Leahy Scale: Poor Standing balance comment: pt able to stand long enough for peri care with flexed posture and RW for support                             Cognition Arousal/Alertness: Awake/alert Behavior During Therapy: Flat affect Overall Cognitive Status: No family/caregiver present to determine baseline cognitive functioning Area of Impairment: Memory;Problem solving;Orientation                 Orientation Level: Disoriented to;Situation   Memory: Decreased short-term memory       Problem Solving: Difficulty sequencing;Requires verbal cues;Requires tactile cues General Comments: perseverating on going back to [place] Comments        Pertinent Vitals/Pain Pain Assessment: No/denies pain    Home Living  Prior Function            PT Goals (current goals can now be found in the care plan section) Progress towards PT goals: Progressing toward goals    Frequency    Min 2X/week      PT Plan Current plan remains appropriate    Co-evaluation              AM-PAC PT "6 Clicks" Mobility   Outcome Measure  Help needed turning from your back to your side while in a flat bed without using bedrails?: A Lot Help needed moving from lying on your back to sitting on the side of a flat bed without using bedrails?: A Lot Help needed moving to and from a bed to a chair  (including a wheelchair)?: A Lot Help needed standing up from a chair using your arms (e.g., wheelchair or bedside chair)?: A Lot Help needed to walk in hospital room?: Total Help needed climbing 3-5 steps with a railing? : Total 6 Click Score: 10    End of Session Equipment Utilized During Treatment: Gait belt Activity Tolerance: Patient tolerated treatment well Patient left: in chair;with chair alarm set;with call bell/phone within reach Nurse Communication: Mobility status;Other (comment)(pt up in chair) PT Visit Diagnosis: Muscle weakness (generalized) (M62.81);Other abnormalities of gait and mobility (R26.89)     Time: 7322-0254 PT Time Calculation (min) (ACUTE ONLY): 35 min  Charges:  $Gait Training: 8-22 mins $Therapeutic Activity: 8-22 mins                     Earney Navy, PTA Acute Rehabilitation Services Pager: [phone] Office: [phone]     Darliss Cheney 07/08/2019, 10:59 AM

## 2019-07-08 NOTE — Progress Notes (Signed)
Initial Nutrition Assessment   RD working remotely.  DOCUMENTATION CODES:   Obesity unspecified  INTERVENTION:  Provide Ensure Enlive po TID (thickened to appropriate consistency), each supplement provides 350 kcal and 20 grams of protein.  Encourage adequate PO intake.   NUTRITION DIAGNOSIS:   Increased nutrient needs related to acute illness as evidenced by estimated needs.  GOAL:   Patient will meet greater than or equal to 90% of their needs  MONITOR:   PO intake, Supplement acceptance, Skin, Weight trends, Labs, I & O's  REASON FOR ASSESSMENT:   Consult Assessment of nutrition requirement/status  ASSESSMENT:   85 y.o. male with medical history significant of hypertension, hyperlipidemia, diabetes mellitus type 2, history of stroke, paroxysmal atrial fibrillation not on anticoagulation, OSA, and alpha thalassemia; who presented from the nursing facility initially with reported complaints of chest pain. Pt with acute encephalopathy, acute renal failure superimposed on chronic kidney disease stage III, E Coli urinary tract infection  Pt unavailable during attempted time of contact. RD unable to obtain pt nutrition history at this time. Pt is currently on a dysphagia 1 diet with nectar thick liquids. Per SLP, pt with fluctuating dysphagia for several years. Per MD, mentation and alertness improving, however pt refusing PO intake. Palliative has been consulted. Family requests [facility] with PT/OT therapies as well as palliative services. RD to order nutritional supplements to encourage PO intake.   Unable to complete Nutrition-Focused physical exam at this time.   Labs and medications reviewed. Phosphorous low at 2.2 being repleated.   Diet Order:   Diet Order            DIET - DYS 1 Room service appropriate? Yes; Fluid consistency: Nectar Thick  Diet effective now              EDUCATION NEEDS:   Not appropriate for education at this  time  Skin:  Skin Assessment: Reviewed RN Assessment  Last BM:  8/17  Height:   Ht Readings from Last 1 Encounters:  06/10/19 5\' 10"  (1.778 m)    Weight:   Wt Readings from Last 1 Encounters:  07/07/19 113.5 kg    Ideal Body Weight:  75.45 kg  BMI:  Body mass index is 35.9 kg/m.  Estimated Nutritional Needs:   Kcal:  8921-1941  Protein:  85-100 grams  Fluid:  >/= 1.8 L/day    Corrin Parker, MS, RD, LDN Pager # [phone] After hours/ weekend pager # [phone]

## 2019-07-08 NOTE — Consult Note (Signed)
Consultation Note Date: 07/08/2019   Patient Name: Jared Hall  DOB: [birth year]  MRN: [id number]  Age / Sex: 85 y.o., male  PCP: Josetta Huddle, MD Referring Physician: Kerney Elbe, DO  Reason for Consultation: Establishing goals of care and Psychosocial/spiritual support  HPI/Patient Profile: 85 y.o. male   admitted on 07/03/2019 with past  medical history significant of hypertension, hyperlipidemia, diabetes mellitus type 2, history of stroke, paroxysmal atrial fibrillation not on anticoagulation, OSA, and alpha thalassemia; who presented from the nursing facility initially with reported complaints of chest pain.   Report notes that chest pain resolved prior to EMS arrival.   Patient had a recent hospital admission on 06/02/2019 for abdominal cramping.   Family at bedside today tell me that patient has limited mobility at his facility, and noted slow physical and functional decline over the last several years.  Patient lives at assisted [facility] for the past 3 years.  His main support persons are his son and daughter-in-law.  Patient and family face treatment option decisions, advanced directive decisions and anticipatory care needs.  Clinical Assessment and Goals of Care:  This NP Wadie Lessen reviewed medical records, received report from team, assessed the patient and then meet at the patient's bedside along with [name]/daughter in law  to discuss diagnosis, prognosis, GOC, EOL wishes disposition and options.  Concept of Hospice and Palliative Care were discussed  A detailed discussion was had today regarding advanced directives.  Concepts specific to code status, artifical feeding and hydration, continued IV antibiotics and rehospitalization was had.  The difference between a aggressive medical intervention path  and a palliative comfort care path for this patient at this time was had.   Values and goals of care important to patient and family were attempted to be elicited.  We discussed concept specific to human mortality and failure to thrive.  We discussed the importance of quality over quantity of life.  MOST form introduced  Natural trajectory and expectations at EOL were discussed.  Questions and concerns addressed.   Family encouraged to call with questions or concerns.    PMT will continue to support holistically.  No documented healthcare power of attorney or advanced directive documented.  Family bedside tell me that patient has stated in the past that he has a living will but they cannot find.  Patient's son Jared Hall is his main support person and decision maker in the event the patient cannot make decisions for himself.    SUMMARY OF RECOMMENDATIONS    Code Status/Advance Care Planning:  DNR   Symptom Management:   Weakness: PT/OT as tolerated within eligibility   Additional Recommendations (Limitations, Scope, Preferences):  Full Scope Treatment  Psycho-social/Spiritual:   Desire for further Chaplaincy support:no  Additional Recommendations: Education on Hospice  Prognosis:   Unable to determine  Discharge Planning:  Patient's and family's preference is to transition back [facility] with Physical and Occupational Therapy along with palliative outpatient services.   To Be Determined  Primary Diagnoses: Present on Admission: . Acute encephalopathy . Hypokalemia . Leukocytosis . Acute renal failure superimposed on chronic kidney disease ([place]) . Chest pain . Urinary tract infection   I have reviewed the medical record, interviewed the patient and family, and examined the patient. The following aspects are pertinent.  Past Medical History:  Diagnosis Date  . Alpha thalassemia ([place])   . Anemia 01/01/2017  . BPH (benign prostatic hyperplasia)   . CHF (congestive heart failure) ([place])   . High cholesterol   .  Hypertension   . Lower extremity edema   . Onychomycosis   . Prostate cancer ([place])    S/P "8 weeks of radiation"  . Prostatitis    recurrent  . Sleep apnea   . Stroke [facility]) ~ 2005   denies residual on 2/123/2015  . Type II diabetes mellitus ([place])   . Umbilical hernia   . Urinary urgency    with incontinence   Social History   Socioeconomic History  . Marital status: Widowed    Spouse name: Not on file  . Number of children: Not on file  . Years of education: Not on file  . Highest education level: Not on file  Occupational History  . Not on file  Social Needs  . Financial resource strain: Not on file  . Food insecurity    Worry: Not on file    Inability: Not on file  . Transportation needs    Medical: Not on file    Non-medical: Not on file  Tobacco Use  . Smoking status: Never Smoker  . Smokeless tobacco: Never Used  Substance and Sexual Activity  . Alcohol use: No  . Drug use: No  . Sexual activity: Never  Lifestyle  . Physical activity    Days per week: Not on file    Minutes per session: Not on file  . Stress: Not on file  Relationships  . Social Herbalist on phone: Not on file    Gets together: Not on file    Attends religious service: Not on file    Active member of club or organization: Not on file    Attends meetings of clubs or organizations: Not on file    Relationship status: Not on file  Other Topics Concern  . Not on file  Social History Narrative   Tobacco Use Cigarettes: Never Smoked   No Alcohol   Caffeine: Yes   No recreational drug use   Marital Status: Widowed          Family History  Problem Relation Age of Onset  . Pneumonia Mother   . Heart attack Father    Scheduled Meds: . antiseptic oral rinse  15 mL Mouth Rinse TID  . Gerhardt's butt cream   Topical TID  . heparin  5,000 Units Subcutaneous Q8H  . sodium chloride flush  3 mL Intravenous Q12H   Continuous Infusions: . sodium chloride 10 mL/hr at 07/04/19  0500  . cefTRIAXone (ROCEPHIN)  IV Stopped (07/07/19 1757)   PRN Meds:.sodium chloride, acetaminophen **OR** acetaminophen, ondansetron **OR** ondansetron (ZOFRAN) IV Medications Prior to Admission:  Prior to Admission medications   Medication Sig Start Date End Date Taking? Authorizing Provider  amitriptyline (ELAVIL) 10 MG tablet Take 10 mg by mouth at bedtime.   Yes [provider]  aspirin EC 81 MG tablet Take 81 mg by mouth daily.   Yes [provider]  azelastine (OPTIVAR) 0.05 % ophthalmic solution Place 1  drop into both eyes 2 (two) times daily as needed (dryness or itching).    Yes [provider]  Benzocaine-Menthol (CEPACOL) 15-2.3 MG LOZG Use as directed 1 lozenge in the mouth or throat 4 (four) times daily as needed (for soreness).    Yes [provider]  Cholecalciferol (VITAMIN D3) 50 MCG (2000 UT) capsule Take 2,000 Units by mouth daily.   Yes [provider]  clopidogrel (PLAVIX) 75 MG tablet Take 75 mg by mouth daily.    Yes [provider]  furosemide (LASIX) 80 MG tablet Take 80 mg by mouth 2 (two) times daily.    Yes [provider]  insulin aspart (NOVOLOG FLEXPEN) 100 UNIT/ML FlexPen Inject 5 Units into the skin See admin instructions. Inject 5 units into the skin three times a day with meals and "check sugar two times a day and use sliding scale coverage as follows: BGL 150 = give nothing; 151-200 = 7 units; 201-250 = 9 units; 251-300 = 11 units; 301-350 = 13 units; 351-400 = 15 units"   Yes [provider]  insulin glargine (LANTUS) 100 UNIT/ML injection Inject 0.1 mLs (10 Units total) into the skin daily. 06/14/19  Yes Kyle, Tyrone A, DO  metolazone (ZAROXOLYN) 5 MG tablet Take 5 mg by mouth See admin instructions. Take 5 mg by mouth once a day on Tues/Fri (hold if no leg edema) 05/20/19  Yes [provider]  metoprolol tartrate (LOPRESSOR) 25 MG tablet Take 1 tablet (25 mg total) by mouth 2  (two) times daily. 03/17/15  Yes Kelvin Cellar, MD  pantoprazole (PROTONIX) 40 MG tablet Take 40 mg by mouth daily.    Yes [provider]  Polyethyl Glycol-Propyl Glycol (SYSTANE) 0.4-0.3 % GEL ophthalmic gel Place 1 application into the right eye See admin instructions. Pull down the lower right eyelid and apply a small amount three times a day as needed for dryness   Yes [provider]  potassium chloride SA (K-DUR,KLOR-CON) 20 MEQ tablet Take 20-40 mEq by mouth See admin instructions. Take 20 mEq by mouth on Sun/Mon/Wed/Thurs/Sat and 40 mEq on Tues/Fri (IF Metolazone is taken)   Yes [provider]  senna-docusate (SENOKOT-S) 8.6-50 MG tablet Take 1 tablet by mouth 2 (two) times daily.   Yes [provider]  simvastatin (ZOCOR) 20 MG tablet Take 20 mg by mouth daily at 8 pm.    Yes [provider]  traMADol (ULTRAM) 50 MG tablet Take 1 tablet (50 mg total) by mouth every 6 (six) hours as needed for moderate pain. Patient taking differently: Take 50 mg by mouth every 6 (six) hours as needed (for pain).  05/24/18  Yes Ghimire, Henreitta Leber, MD  vitamin B-12 (CYANOCOBALAMIN) 500 MCG tablet Take 500 mcg by mouth daily.   Yes [provider]  acetaminophen (TYLENOL) 500 MG tablet Take 500 mg by mouth every 6 (six) hours as needed for fever.    [provider]   Allergies  Allergen Reactions  . Ativan [Lorazepam] Other (See Comments)    Flailing of extremities noted immediately s/p IV administration. "Allergic," per MAR  . Flomax [Tamsulosin Hcl] Other (See Comments)    Excessive tearing; "Allergic," per MAR  . Glucophage [Metformin Hcl] Diarrhea and Other (See Comments)    "Allergic," per MAR   Review of Systems  Unable to perform ROS: Other    Physical Exam Constitutional:      Appearance: He is overweight. He is ill-appearing.  Comments: gross  hard of hearing  Cardiovascular:     Rate and Rhythm: Normal rate and regular  rhythm.     Heart sounds: Normal heart sounds.  Pulmonary:     Breath sounds: Decreased air movement present.  Skin:    General: Skin is warm and dry.  Neurological:     Mental Status: He is alert.     Vital Signs: BP (!) 177/67 (BP Location: Left Arm)   Pulse 89   Temp 97.7 F (36.5 C) (Oral)   Resp 18   Wt 113.5 kg   SpO2 100%   BMI 35.90 kg/m  Pain Scale: 0-10   Pain Score: 0-No pain   SpO2: SpO2: 100 % O2 Device:SpO2: 100 % O2 Flow Rate: .O2 Flow Rate (L/min): 0 L/min  IO: Intake/output summary:   Intake/Output Summary (Last 24 hours) at 07/08/2019 0916 Last data filed at 07/08/2019 2500 Gross per 24 hour  Intake 244.19 ml  Output 1550 ml  Net -1305.81 ml    LBM: Last BM Date: 07/07/19 Baseline Weight: Weight: 112.5 kg Most recent weight: Weight: 113.5 kg     Palliative Assessment/Data: 30 % at best   Discussed with CMRN and SLCW  Time In: 1220 Time Out: 1330 Time Total: 70 minutes Greater than 50%  of this time was spent counseling and coordinating care related to the above assessment and plan.  Signed by: Wadie Lessen, NP   Please contact Palliative Medicine Team phone at [phone] for questions and concerns.  For individual provider: See Shea Evans

## 2019-07-08 NOTE — Progress Notes (Signed)
PROGRESS NOTE    Jared Hall  XYI:[id number] DOB: 17-Oct-1931 DOA: 07/03/2019 PCP: Josetta Huddle, MD  Brief Narrative:  HPI per Dr. Fuller Plan on 07/03/2019 Jared Hall is a 85 y.o. male with medical history significant of hypertension, hyperlipidemia, diabetes mellitus type 2, history of stroke, paroxysmal atrial fibrillation not on anticoagulation, OSA, and alpha thalassemia; who presented from the nursing facility initially with reported complaints of chest pain.  Reports note that chest pain resolved prior to EMS arrival.  History is limited however as patient found to be altered and hard to arouse currently.  Cannot get him to answer questions at this time.   ED Course: Upon admission into the emergency department patient was noted to be afebrile, pulse 73-88, respirations 15-25, blood pressures 142 138/103, and O2 saturations maintained on room air.  Labs revealed WBC 11, sodium 134, potassium 2.8, chloride 83, CO2 36, BUN 55, and creatinine 2.8.  COVID-19 testing negative. Urinalysis positive for small hemoglobin, large leukocytes, many bacteria, and greater than 50 WBCs.  Patient was given 500 mL of normal saline, 40 mEq of potassium chloride IV, and Rocephin.  Nursing staff while in the ED noted heart rates going down into the 30s with appearance of first-degree heart block.  **Interim History  Patient remained drowsy and encephalopathic slowly improving today he is more alert and able to tell me his name and was more interactive.  Cardiology checking echocardiogram and showed an EF of 60 to 65%.  Bradycardia is resolved.  Patient is urinary tract infection is being treated culture showed no growth to date at 3 days.  He is improving slowly and renal function is appropriately started down but we will stop IV fluids currently given his diastolic dysfunction.  Patient was complaining of significant abdominal pain yesterday on palpation and nurse states that he is not been tolerating his p.o.  and did not want to eat.  Repeat CT scan without contrast was not able to be done as patient did not want to tolerate the p.o. so was given to him IV last night.  CT abdomen of the pelvis with contrast showed no acute abnormality detected but he had multiple chronic changes including aggressive changes in the superior endplate of the L4 vertebral body with no acute displaced fracture or dislocation and there was small bowel malrotation noted in multiple right-sided renal cysts as well as a 1.6 cm cystic structure in the pancreatic body which is unchanged since 2014.  Palliative care was consulted for further goals of care discussion given that he was not eating properly and after palliative care discussions patient's family's preference to transition back to his ALF/[facility] with PT OT if possible along with outpatient palliative care services.  He is more awake alert today and he is wanting to go back to [employer].  Assessment & Plan:   Principal Problem:   Acute encephalopathy Active Problems:   DM (diabetes mellitus) (HCC)   Weakness generalized   Hypokalemia   Leukocytosis   Acute renal failure superimposed on chronic kidney disease (HCC)   Chest pain   Urinary tract infection   Palliative care by specialist   DNR (do not resuscitate)  Acute Encephalopathy slowly improving and he was more alert today -Patient presented from the nursing facility after complaining of chest pain and was found to be lethargic and somnolent; not as somnolent and states that he is feeling much better since coming in -Admitted to a progressive bed -C/w Neurochecks  -  N.p.o. for now and obtain SLP evaluation; SLP recommending dysphagia 1 diet with nectar thick liquids we will continue now -Aspiration precautions -Check ABG  and showed a pH of 7.400, PCO2 of 57.3, PO2 139.0, bicarbonate level 35.5, and O2 saturation 99% -Will need PT/OT Evaluation are recommending skilled nursing facility -Will place  on BiPAP( if needed) -Check blood cultures x2 no growth to at 2 days and follow urine culture; initial one showed greater than 100,000 colony-forming units of E. coli and repeat showed less than 10,000 colonies of insignificant growth; initial urinalysis shows that the patient's E. coli is pansensitive and will continue current antibiotics until he is fully taken p.o. -Chest x-ray showed "Unchanged appearance of the chest with LEFT LOWER lung atelectasis and possible small LEFT pleural effusion." -Follow-up CT scan of the Head showed "No acute intracranial abnormality. Atrophy, chronic microvascular disease. Stable right parietal lobe and bilateral cerebellar chronic ischemic infarcts." -If not improving after treating the urinary tract infection will need to consult neurology for further evaluation but since he is getting better we will hold off -RPR is pending and B12 level was 1393; TSH was 1.269 -Because the patient complained of some abdominal pain will obtain a CT abdomen and pelvis without contrast given his recent renal dysfunction unfortunately patient did not drink contrast was ordered with IV contrast last night and showed no acute pathology noted. -Continue to monitor; leave care consulted for goals of care patient was not tolerating p.o. as well and was refusing to eat; also called nutritionist for further evaluation  Acute Renal Failure superimposed on chronic kidney disease stage III Contraction alkalosis  -Patient's baseline creatinine previously noted to be around 1.2 last, but presents with creatinine elevated up to 2.8 with BUN 51.   -C/w Strict intake and output and Monitor OUP -Chloride was 83 and CO2 was 36 on admission; now chloride is 95 and CO2 is 32 -BUN/creatinine and improved to 12/1.10 and today it is 13/1.29 as patient did receive IV contrast yesterday for his CT scan -Check urine creatinine, urine urea; Urine Cr was 48.88 -IVF Hydration is now stopped  -Continue to  Hold diuretics -Avoid nephrotoxic medications, contrast dyes, hypotension if possible -Continue to monitor and trend renal function -Repeat CMP in a.m.  Chest Pain improved -Troponin is mildly elevated at 26 and 28 and is not consistent with ACS -Had a cardiac catheterization in 2008 with normal coronaries  -EKG with Q waves in the inferior and anterior leads.  Patient with previous history of negative cardiac cath in 2018. -Follow-up repeat echo per Cardiology and this is supposed to be done today  -Appreciate Cardiology Evaluation and Further Recommendations -They do not feel this consistent with an ACS process  Bradycardia with PVCs and PACs and first-degree AV block -Continue to hold metoprolol and any other AV nodal blocking agents for now next-check echocardiogram -Replete electrolytes and potassium is 4.7 and magnesium is 2.1 and currently being repleted -Continue to monitor on telemetry as bradycardia has resolved  E Coli Urinary Tract Infection -Acute.  Could be from the diarrhea the patient was having prior to admission as apparently he was sitting in his own stool PTA   -Urinalysis positive for signs of infection as showed cloudy appearance, small hemoglobin, large leukocytes, many bacteria, 0-5 squamous epithelial cells and greater than 50 WBCs.   -Initial urine culture growing greater than 100,000 colonies of E. coli and repeat was showing less than 10,000 colonies of insignificant growth -Patient was started on  Rocephin IV. -Blood cultures x2 showed no growth to date at 3 Days -Continue Rocephin IV for now until sensitivities result  Leukocytosis, improved -Acute. Initial WBC 11 on admission and repeat this AM was 9.4  -Suspect this could be secondary to the urinary tract infection. -Continue to Monitor and Repeat CBC in AM   Hypokalemia, improved -Acute.On admission potassium noted to be 2.8 on Admision.   -Replete with IV potassium chloride 30 mEq along with IV  potassium phosphate 20 mmol yesterday; currently potassium is now 4.7 -Checked Magnesium level was 2.1 -Continue to monitor and Replete as Necessary -Repeat CMP in AM  Hypomagnesemia -Patient's magnesium level this morning is 2.1 -Replete with IV mag sulfate 2 g yesterday -Continue monitor replete as necessary -Repeat Magnesium level in the a.m.  Hypophosphatemia -Patient's phosphorus level this morning was 2.2 -Replete with IV K-Phos 20 mmol yesterday and will replete with p.o. K Phos Neutral 500 mg p.o. twice daily x2 doses -Continue monitor and replete as necessary -Repeat phosphorus level in a.m.  Diastolic CHF -Patient with 1+ pitting edema of the bilateral lower extremities but is not volume overloaded.  -No significant JVD appreciated.  Last EF noted to be 55 to 60% with grade 1 diastolic dysfunction in 0/2542.;  Repeat echo showed 60 to 65% and consistent with diastolic impairment again -Strict I's/O's and Daily Weights; +2807 L since admission -Metoprolol Tartrate 25 mg po BID currently being held due to bradycardia and cardiology continues to recommend to hold it for now and follow up with Dr. Radford Pax to resume  -Continue to Hold Furosemide 80 mg BID and Metolazone 5 mg po Daily for now given his recent acute kidney injury -Continue to Monitor Volume Status Carefully    Diabetes Mellitus Type 2 -Hemoglobin A1c noted to be 8.2 on 06/08/2019.  Patient currently altered and n.p.o. therefore we will hold IV insulin at this time. -C/w Hypoglycemic protocol -CBGs every 6 hours -Restart insulin when tolerating a diet; Takes Lantus 10 units sq Daily andSS with Novolog  -Has not consistently been taking p.o. intake properly per nursing -CBGs have been ranging from 142-213; blood sugar on this a.m.'s CMP was 163  Essential Hypertension -Restart home blood pressure medications when medically appropriate -blood pressure this afternoon was 145/63 -Was on metoprolol Tartrate 25 mg po  BID but being held due to Bradycardia -Holding Metolazone 5 mg po Daily and Furosemide 80 mg po BID currently given his kidney function was worse and is now improving  History of Stroke -Continue Aspirin 81 mg po Daily, Simvastatin 20 mg po Daily, and Clopidogrel 75 mg po Daily when able  History of prostate cancer s/p radiation treatment BPH -Continue to Monitor Volume Output   Microcytic Anemia and history of alpha thalassemia -Likely was hemoconcentrated on admission and has a dilutional drop now -Patient's hemoglobin/hematocrit went from 13.2/41.7 is now 12.3/40.3 -Checked Anemia panel showed an iron level of 57, U IBC 184, TIBC of 241, saturation ratios of 24%, ferritin level 130, folate level 4.8, and vitamin B12 level of 1393 -Continue to monitor for signs and symptoms of bleeding; currently no overt bleeding noted -Repeat CBC in the a.m.  Hyponatremia/Hypochloremia, improving -In the setting of diuresis  -Improved with IV fluid resuscitation -Patient sodium went from 134 and is now 140 -Chloride is improving and went from 83 is now 99 -Continue to monitor and trend -Repeat CMP in the a.m.  Obesity -Estimated body mass index is 35.9 kg/m as calculated from the following:  Height as of 06/10/19: 5\' 10"  (1.778 m).   Weight as of this encounter: 113.5 kg. -Not awake enough for Weight Loss and Dietary Counseling   Abdominal Pain and Recent Gastric Outlet Obstruction and ?Recent Diarrhea -Per report he had been having ? fecal incontinence prior to admission but has not had any bowel movements here -Check CT of the abdomen and pelvis without contrast given that he still complaining of some abdominal pain on palpation and because he is not taking p.o. properly and nursing states that he does not want to eat and this is unsure if it is secondary to abdominal pain -Unfortunately CT of the abdomen and pelvis could not be done with oral contrast as patient refused to IV contrast was  ordered last night by the night practitioner.  CT showed "No acute abnormality detected but he had multiple chronic changes including aggressive changes in the superior endplate of the L4 vertebral body with no acute displaced fracture or dislocation and there was small bowel malrotation noted in multiple right-sided renal cysts as well as a 1.6 cm cystic structure in the pancreatic body which is unchanged since 2014." -He was hospitalized and discharged on 06/14/2019 and felt to have had gastric outlet obstruction -Underwent EGD on 721 without findings of mass or lesion to explain obstruction -He was placed on soft diet at that time -Continue to monitor as patient was having significant abdominal pain and will likely need to discuss with GI again pending his results of his CT of the abdomen -Recently had constipation and had to be given a soapsuds enema  DVT prophylaxis: Heparin 5,000 units sq q8h Code Status: DO NOT RESUSCITATE  Family Communication: No family present at bedside  Disposition Plan: Remain Inpatient for continued workup and treatment and DC to SNF when medically stable versus [facility] with PT and OT;  Consultants:   Cardiology Dr. Kelly/Dr. Debara Pickett  Palliative Care [facility]    Procedures:  Echocardiogram IMPRESSIONS    1. The left ventricle has normal systolic function with an ejection fraction of 60-65%. The cavity size was normal. There is severe asymmetric left ventricular hypertrophy. Left ventricular diastolic Doppler parameters are consistent with impaired  relaxation. No evidence of left ventricular regional wall motion abnormalities.  2. The right ventricle has normal systolic function. The cavity was normal. There is no increase in right ventricular wall thickness. Right ventricular systolic pressure could not be assessed.  3. There is moderate mitral annular calcification present.  4. The aortic valve is tricuspid. Severely thickening of the aortic valve.  Severe calcifcation of the aortic valve. Aortic valve regurgitation is trivial by color flow Doppler.  5. The aorta is abnormal unless otherwise noted.  6. There is mild dilatation of the aortic root measuring 40 mm.  7. The interatrial septum appears to be lipomatous.  FINDINGS  Left Ventricle: The left ventricle has normal systolic function, with an ejection fraction of 60-65%. The cavity size was normal. There is severe asymmetric left ventricular hypertrophy. Left ventricular diastolic Doppler parameters are consistent with  impaired relaxation. Normal left ventricular filling pressures No evidence of left ventricular regional wall motion abnormalities..  Right Ventricle: The right ventricle has normal systolic function. The cavity was normal. There is no increase in right ventricular wall thickness. Right ventricular systolic pressure could not be assessed.  Left Atrium: Left atrial size was normal in size.  Right Atrium: Right atrial size was normal in size. Right atrial pressure is estimated at 3 mmHg.  Interatrial Septum: No atrial level shunt detected by color flow Doppler. Increased thickness of the atrial septum sparing the fossa ovalis consistent with The interatrial septum appears to be lipomatous.  Pericardium: There is no evidence of pericardial effusion.  Mitral Valve: The mitral valve is normal in structure. There is moderate mitral annular calcification present. Mitral valve regurgitation was not assessed by color flow Doppler.  Tricuspid Valve: The tricuspid valve is normal in structure. Tricuspid valve regurgitation was not visualized by color flow Doppler.  Aortic Valve: The aortic valve is tricuspid Severely thickening of the aortic valve. Severe calcifcation of the aortic valve. Aortic valve regurgitation is trivial by color flow Doppler.  Pulmonic Valve: The pulmonic valve was normal in structure. Pulmonic valve regurgitation is not visualized by color flow  Doppler.  Aorta: The aorta is abnormal unless otherwise noted. There is mild dilatation of the aortic root measuring 40 mm.  Venous: The inferior vena cava measures 1.79 cm, is normal in size with greater than 50% respiratory variability.    +--------------+--------++  LEFT VENTRICLE            +----------------+---------++ +--------------+--------++  Diastology                    PLAX 2D                   +----------------+---------++ +--------------+--------++  LV e' lateral:   5.77 cm/s    LVIDd:         4.89 cm    +----------------+---------++ +--------------+--------++  LV E/e' lateral: 14.1         LVIDs:         3.86 cm    +----------------+---------++ +--------------+--------++  LV PW:         1.02 cm    +--------------+--------++  LV IVS:        1.59 cm    +--------------+--------++  LVOT diam:     2.30 cm    +--------------+--------++  LV SV:         48 ml      +--------------+--------++  LV SV Index:   20.24      +--------------+--------++  LVOT Area:     4.15 cm   +--------------+--------++                            +--------------+--------++  +---------------+----------++  RIGHT VENTRICLE              +---------------+----------++  RV Basal diam:  2.81 cm      +---------------+----------++  RV S prime:     12.30 cm/s   +---------------+----------++  TAPSE (M-mode): 1.7 cm       +---------------+----------++  +---------------+-------++-----------++  LEFT ATRIUM              Index         +---------------+-------++-----------++  LA diam:        4.20 cm  1.85 cm/m    +---------------+-------++-----------++  LA Vol (A2C):   81.3 ml  35.89 ml/m   +---------------+-------++-----------++  LA Vol (A4C):   57.3 ml  25.29 ml/m   +---------------+-------++-----------++  LA Biplane Vol: 69.5 ml  30.68 ml/m   +---------------+-------++-----------++ +------------+---------++-----------++  RIGHT ATRIUM            Index          +------------+---------++-----------++  RA Pressure: 3.00 mmHg                +------------+---------++-----------++  RA Area:     15.80 cm                +------------+---------++-----------++  RA Volume:   36.90 ml   16.29 ml/m   +------------+---------++-----------++  +------------+-----------++  AORTIC VALVE               +------------+-----------++  LVOT Vmax:   82.50 cm/s    +------------+-----------++  LVOT Vmean:  53.700 cm/s   +------------+-----------++  LVOT VTI:    0.164 m       +------------+-----------++  AR PHT:      312 msec      +------------+-----------++   +-------------+-------++  AORTA                   +-------------+-------++  Ao Root diam: 4.00 cm   +-------------+-------++  Ao Asc diam:  3.65 cm   +-------------+-------++  +--------------+----------++  +---------------+---------++  MITRAL VALVE                  TRICUSPID VALVE             +--------------+----------++  +---------------+---------++  MV Area (PHT): 2.48 cm       Estimated RAP:  3.00 mmHg   +--------------+----------++  +---------------+---------++  MV PHT:        88.74 msec   +--------------+----------++  +--------------+-------+  MV Decel Time: 306 msec       SHUNTS                  +--------------+----------++  +--------------+-------+ +--------------+-----------++  Systemic VTI:  0.16 m    MV E velocity: 81.20 cm/s    +--------------+-------+ +--------------+-----------++  Systemic Diam: 2.30 cm   MV A velocity: 135.00 cm/s   +--------------+-------+ +--------------+-----------++  MV E/A ratio:  0.60          +--------------+-----------++  +---------+-------+  IVC                +---------+-------+  IVC diam: 1.79 cm  +---------+-------+   Antimicrobials:  Anti-infectives (From admission, onward)   Start     Dose/Rate Route Frequency Ordered Stop   07/04/19 1500  cefTRIAXone (ROCEPHIN) 1 g in sodium chloride 0.9 % 100 mL IVPB     1 g 200 mL/hr over 30 Minutes  Intravenous Every 24 hours 07/03/19 1557     07/03/19 1445  cefTRIAXone (ROCEPHIN) 1 g in sodium chloride 0.9 % 100 mL IVPB     1 g 200 mL/hr over 30 Minutes Intravenous  Once 07/03/19 1430 07/03/19 1838     Subjective: Seen and examined at bedside sitting up and more alert today than I have seen and he is more conversive.  Stated that he want to go back to [facility].  Stated he still has some abdominal pain.  Able to tell me that he was in the hospital able to tell me his name.  No chest pain, lightheadedness or any other concerns or plans but nursing states that he has not been eating at all and not willing to take anything p.o.  Objective: Vitals:   07/07/19 2339 07/08/19 0347 07/08/19 0802 07/08/19 1318  BP: (!) 149/62 (!) 143/78 (!) 177/67 (!) 154/66  Pulse: 94 91 89 96  Resp: 17 17 18 20   Temp: 97.8 F (36.6 C) 97.9 F (36.6 C) 97.7 F (36.5 C) 97.8 F (36.6 C)  TempSrc: Oral Oral Oral Oral  SpO2: 98% 98% 100% 99%  Weight:  Intake/Output Summary (Last 24 hours) at 07/08/2019 1601 Last data filed at 07/08/2019 1497 Gross per 24 hour  Intake 184.19 ml  Output 1550 ml  Net -1365.81 ml   Filed Weights   07/06/19 0352 07/07/19 0354  Weight: 112.5 kg 113.5 kg    Examination: Physical Exam:  Constitutional: Well-nourished, well-developed obese elderly Caucasian male currently sitting up in bed and is more awake and appears somewhat anxious and wanting to go back to [facility] Eyes: Lids and conjunctive are normal.  Sclera anicteric ENMT: External ears and nose appear normal.  Grossly normal hearing Neck: Appears supple no JVD Respiratory: Diminished auscultation bilaterally no appreciable wheezing, rales, rhonchi.  Patient not tachypneic or using any accessory muscles to breathe Cardiovascular: Regular rate and rhythm.  No appreciable murmurs, rubs, gallops.  Has 1+ lower extremity edema bilaterally Abdomen: Soft, moderately tender to palpate.  Distended  secondary body habitus.  Bowel sounds present GU: Please deferred Musculoskeletal: No contractures or cyanosis noted on limited skin evaluation.  No 24 Skin: Skin is warm and dry no appreciable rashes or lesions on feet skin evaluation but does have lower extremity skin changes consistent with venous stasis chronic .  Left leg is in a Prevalon boot.  Unable to view 6 months is difficult for him patient Neurologic: Cranial nerves II through XII grossly intact no appreciable focal deficits Psychiatric: Is awake and alert and more oriented today x2.  Has a anxious mood affect today  Data Reviewed: I have personally reviewed following labs and imaging studies  CBC: Recent Labs  Lab 07/03/19 1121  07/04/19 0434 07/05/19 0329 07/06/19 0546 07/07/19 1008 07/08/19 0320  WBC 11.0*  --  8.8 7.8 8.5 8.5 9.4  NEUTROABS 8.4*  --   --  5.8 6.0 6.1 6.7  HGB 13.2   < > 11.0* 11.2* 12.3* 12.9* 12.3*  HCT 41.7   < > 35.8* 37.3* 40.8 43.3 40.3  MCV 69.5*  --  70.5* 70.6* 71.5* 71.2* 70.2*  PLT 244  --  216 200 175 213 205   < > = values in this interval not displayed.   Basic Metabolic Panel: Recent Labs  Lab 07/04/19 0434 07/05/19 0329 07/06/19 0546 07/07/19 1008 07/08/19 0320  NA 137 141 141 141 140  K 3.5 3.0* 3.4* 3.7 4.7  CL 92* 95* 97* 97* 99  CO2 31 32 32 28 28  GLUCOSE 132* 133* 180* 163* 178*  BUN 41* 26* 18 12 13   CREATININE 2.12* 1.59* 1.25* 1.10 1.29*  CALCIUM 9.3 9.4 9.4 9.5 9.6  MG 1.6* 2.1 1.8 1.7 2.1  PHOS  --  1.7* 1.8* 2.1* 2.2*   GFR: Estimated Creatinine Clearance: 51.9 mL/min (A) (by C-G formula based on SCr of 1.29 mg/dL (H)). Liver Function Tests: Recent Labs  Lab 07/03/19 1121 07/05/19 0329 07/06/19 0546 07/07/19 1008 07/08/19 0320  AST 31 22 21 23 26   ALT 17 11 11 15 16   ALKPHOS 79 58 65 69 65  BILITOT 0.9 0.9 0.6 0.7 1.0  PROT 7.7 6.1* 6.6 6.8 6.4*  ALBUMIN 3.9 2.8* 3.0* 3.2* 3.0*   Recent Labs  Lab 07/03/19 1121  LIPASE 46   Recent Labs    Lab 07/03/19 1156  AMMONIA 13   Coagulation Profile: No results for input(s): INR, PROTIME in the last 168 hours. Cardiac Enzymes: No results for input(s): CKTOTAL, CKMB, CKMBINDEX, TROPONINI in the last 168 hours. BNP (last 3 results) No results for input(s): PROBNP in the last 8760  hours. HbA1C: No results for input(s): HGBA1C in the last 72 hours. CBG: Recent Labs  Lab 07/07/19 0600 07/07/19 1254 07/07/19 1717 07/08/19 0619 07/08/19 1155  GLUCAP 144* 157* 140* 174* 213*   Lipid Profile: No results for input(s): CHOL, HDL, LDLCALC, TRIG, CHOLHDL, LDLDIRECT in the last 72 hours. Thyroid Function Tests: No results for input(s): TSH, T4TOTAL, FREET4, T3FREE, THYROIDAB in the last 72 hours. Anemia Panel: Recent Labs    07/06/19 0546  VITAMINB12 1,393*  FOLATE 4.8*  FERRITIN 130  TIBC 241*  IRON 57  RETICCTPCT 1.5   Sepsis Labs: No results for input(s): PROCALCITON, LATICACIDVEN in the last 168 hours.  Recent Results (from the past 240 hour(s))  SARS Coronavirus 2 [facility] order, [facility] hospital lab) Nasopharyngeal Nasopharyngeal Swab     Status: None   Collection Time: 07/03/19 11:22 AM   Specimen: Nasopharyngeal Swab  Result Value Ref Range Status   SARS Coronavirus 2 NEGATIVE NEGATIVE Final    Comment: (NOTE) If result is NEGATIVE SARS-CoV-2 target nucleic acids are NOT DETECTED. The SARS-CoV-2 RNA is generally detectable in upper and lower  respiratory specimens during the acute phase of infection. The lowest  concentration of SARS-CoV-2 viral copies this assay can detect is 250  copies / mL. A negative result does not preclude SARS-CoV-2 infection  and should not be used as the sole basis for treatment or other  patient management decisions.  A negative result may occur with  improper specimen collection / handling, submission of specimen other  than nasopharyngeal swab, presence of viral mutation(s) within the  areas targeted by this  assay, and inadequate number of viral copies  (<250 copies / mL). A negative result must be combined with clinical  observations, patient history, and epidemiological information. If result is POSITIVE SARS-CoV-2 target nucleic acids are DETECTED. The SARS-CoV-2 RNA is generally detectable in upper and lower  respiratory specimens dur ing the acute phase of infection.  Positive  results are indicative of active infection with SARS-CoV-2.  Clinical  correlation with patient history and other diagnostic information is  necessary to determine patient infection status.  Positive results do  not rule out bacterial infection or co-infection with other viruses. If result is PRESUMPTIVE POSTIVE SARS-CoV-2 nucleic acids MAY BE PRESENT.   A presumptive positive result was obtained on the submitted specimen  and confirmed on repeat testing.  While 2019 novel coronavirus  (SARS-CoV-2) nucleic acids may be present in the submitted sample  additional confirmatory testing may be necessary for epidemiological  and / or clinical management purposes  to differentiate between  SARS-CoV-2 and other Sarbecovirus currently known to infect humans.  If clinically indicated additional testing with an alternate test  methodology [phone]) is advised. The SARS-CoV-2 RNA is generally  detectable in upper and lower respiratory sp ecimens during the acute  phase of infection. The expected result is Negative. Fact Sheet for Patients:  StrictlyIdeas.no Fact Sheet for Healthcare Providers: BankingDealers.co.za This test is not yet approved or cleared by the [place] FDA and has been authorized for detection and/or diagnosis of SARS-CoV-2 by FDA under an Emergency Use Authorization (EUA).  This EUA will remain in effect (meaning this test can be used) for the duration of the COVID-19 declaration under Section 564(b)(1) of the Act, 21 U.S.C. section 360bbb-3(b)(1),  unless the authorization is terminated or revoked sooner. Performed at [facility], [place] [address], [place], [place] [address]   Urine culture  Status: Abnormal   Collection Time: 07/03/19  1:30 PM   Specimen: Urine, Random  Result Value Ref Range Status   Specimen Description URINE, RANDOM  Final   Special Requests   Final    NONE Performed at [facility], [address], [place], [place] [address]    Culture (A)  Final    >=100,000 COLONIES/mL ESCHERICHIA COLI 10,000 COLONIES/mL PROTEUS MIRABILIS    Report Status 07/08/2019 FINAL  Final   Organism ID, Bacteria ESCHERICHIA COLI (A)  Final   Organism ID, Bacteria PROTEUS MIRABILIS (A)  Final      Susceptibility   Escherichia coli - MIC*    AMPICILLIN 8 SENSITIVE Sensitive     CEFAZOLIN <=4 SENSITIVE Sensitive     CEFTRIAXONE <=1 SENSITIVE Sensitive     CIPROFLOXACIN <=0.25 SENSITIVE Sensitive     GENTAMICIN <=1 SENSITIVE Sensitive     IMIPENEM <=0.25 SENSITIVE Sensitive     NITROFURANTOIN <=16 SENSITIVE Sensitive     TRIMETH/SULFA <=20 SENSITIVE Sensitive     AMPICILLIN/SULBACTAM 4 SENSITIVE Sensitive     PIP/TAZO <=4 SENSITIVE Sensitive     Extended ESBL NEGATIVE Sensitive     * >=100,000 COLONIES/mL ESCHERICHIA COLI   Proteus mirabilis - MIC*    AMPICILLIN <=2 SENSITIVE Sensitive     CEFAZOLIN <=4 SENSITIVE Sensitive     CEFTRIAXONE <=1 SENSITIVE Sensitive     CIPROFLOXACIN <=0.25 SENSITIVE Sensitive     GENTAMICIN <=1 SENSITIVE Sensitive     IMIPENEM 2 SENSITIVE Sensitive     NITROFURANTOIN 128 RESISTANT Resistant     TRIMETH/SULFA <=20 SENSITIVE Sensitive     AMPICILLIN/SULBACTAM <=2 SENSITIVE Sensitive     PIP/TAZO <=4 SENSITIVE Sensitive     * 10,000 COLONIES/mL PROTEUS MIRABILIS  Culture, blood (routine x 2)     Status: None (Preliminary result)   Collection Time: 07/04/19  9:25 AM   Specimen: BLOOD  Result Value Ref Range Status   Specimen Description BLOOD LEFT ANTECUBITAL  Final   Special  Requests AEROBIC BOTTLE ONLY Blood Culture adequate volume  Final   Culture   Final    NO GROWTH 4 DAYS Performed at [facility], [place] [address] [place], [place] [address]    Report Status PENDING  Incomplete  Culture, blood (routine x 2)     Status: None (Preliminary result)   Collection Time: 07/04/19  9:25 AM   Specimen: BLOOD  Result Value Ref Range Status   Specimen Description BLOOD LEFT ANTECUBITAL  Final   Special Requests AEROBIC BOTTLE ONLY Blood Culture adequate volume  Final   Culture   Final    NO GROWTH 4 DAYS Performed at [facility], [place] [address], [place], [place] [address]    Report Status PENDING  Incomplete  Culture, Urine     Status: Abnormal   Collection Time: 07/04/19 10:46 AM   Specimen: Urine, Random  Result Value Ref Range Status   Specimen Description URINE, RANDOM  Final   Special Requests NONE  Final   Culture (A)  Final    <10,000 COLONIES/mL INSIGNIFICANT GROWTH Performed at [facility], [place] [address] [place],  [address]    Report Status 07/05/2019 FINAL  Final    Radiology Studies: Ct Abdomen Pelvis W Contrast  Result Date: 07/07/2019 CLINICAL DATA:  Abdominal distension EXAM: CT ABDOMEN AND PELVIS WITH CONTRAST TECHNIQUE: Multidetector CT imaging of the abdomen and pelvis was performed using the standard protocol following bolus administration  of intravenous contrast. CONTRAST:  14mL OMNIPAQUE IOHEXOL 300 MG/ML  SOLN COMPARISON:  CT dated June 08, 2019 FINDINGS: Lower chest: There is atelectasis at the left lung base.The heart size is normal. Hepatobiliary: The liver is normal. Normal gallbladder.There is no biliary ductal dilation. Pancreas: There is a stable 1.6 cm cystic structure in the pancreatic body. This is essentially unchanged since 1224 in almost certainly represents a benign process. Spleen: No splenic laceration or hematoma. Adrenals/Urinary Tract: --Adrenal glands: No adrenal hemorrhage. --Right  kidney/ureter: Multiple right-sided cysts are noted. There is no hydronephrosis. --Left kidney/ureter: No hydronephrosis or perinephric hematoma. --Urinary bladder: Unremarkable. Stomach/Bowel: --Stomach/Duodenum: The stomach is unremarkable. There is small bowel malrotation. --Small bowel: There is no small bowel obstruction. There are likely some small bowel enteroperitoneal adhesions in the low anterior abdominal cavity. --Colon: Rectosigmoid diverticulosis without acute inflammation. --Appendix: Normal. Vascular/Lymphatic: Atherosclerotic calcification is present within the non-aneurysmal abdominal aorta, without hemodynamically significant stenosis. --there is a stable low-attenuation 1.5 by 1.2 cm structure in the right periaortic region (axial series 3, image 46). This is grossly stable since July. --No mesenteric lymphadenopathy. --No pelvic or inguinal lymphadenopathy. Reproductive: Unremarkable Other: No ascites or free air. There are bilateral fat containing inguinal hernias. There is some subcutaneous gas in the low anterior abdominal wall favored to be secondary to a subcutaneous injection. Musculoskeletal. There are somewhat aggressive changes involving the superior endplate of the L4 vertebral body, however these are si there is no acute displaced fracture or dislocation. Milar to CT in July and are likely degenerative as opposed to infectious. IMPRESSION: 1. No acute abnormality detected. 2. Multiple chronic changes as detailed above, not significantly changed from July CT. Electronically Signed   By: Constance Holster M.D.   On: 07/07/2019 22:07   Scheduled Meds:  antiseptic oral rinse  15 mL Mouth Rinse TID   Gerhardt's butt cream   Topical TID   heparin  5,000 Units Subcutaneous Q8H   sodium chloride flush  3 mL Intravenous Q12H   Continuous Infusions:  sodium chloride 10 mL/hr at 07/04/19 0500   cefTRIAXone (ROCEPHIN)  IV Stopped (07/07/19 1757)    LOS: 5 days   Kerney Elbe, DO [facility] PAGER is on AMION  If 7PM-7AM, please contact night-coverage [email or web address] Password [facility] 07/08/2019, 4:01 PM

## 2019-07-09 DIAGNOSIS — R627 Adult failure to thrive: Secondary | ICD-10-CM

## 2019-07-09 DIAGNOSIS — N189 Chronic kidney disease, unspecified: Secondary | ICD-10-CM

## 2019-07-09 DIAGNOSIS — A498 Other bacterial infections of unspecified site: Secondary | ICD-10-CM | POA: Diagnosis present

## 2019-07-09 DIAGNOSIS — R471 Dysarthria and anarthria: Secondary | ICD-10-CM

## 2019-07-09 DIAGNOSIS — B9620 Unspecified Escherichia coli [E. coli] as the cause of diseases classified elsewhere: Secondary | ICD-10-CM | POA: Diagnosis present

## 2019-07-09 DIAGNOSIS — G9341 Metabolic encephalopathy: Secondary | ICD-10-CM

## 2019-07-09 LAB — CBC WITH DIFFERENTIAL/PLATELET
Abs Immature Granulocytes: 0.03 10*3/uL (ref 0.00–0.07)
Basophils Absolute: 0 10*3/uL (ref 0.0–0.1)
Basophils Relative: 0 %
Eosinophils Absolute: 0.5 10*3/uL (ref 0.0–0.5)
Eosinophils Relative: 5 %
HCT: 42.1 % (ref 39.0–52.0)
Hemoglobin: 12.5 g/dL — ABNORMAL LOW (ref 13.0–17.0)
Immature Granulocytes: 0 %
Lymphocytes Relative: 16 %
Lymphs Abs: 1.5 10*3/uL (ref 0.7–4.0)
MCH: 21.2 pg — ABNORMAL LOW (ref 26.0–34.0)
MCHC: 29.7 g/dL — ABNORMAL LOW (ref 30.0–36.0)
MCV: 71.4 fL — ABNORMAL LOW (ref 80.0–100.0)
Monocytes Absolute: 1.1 10*3/uL — ABNORMAL HIGH (ref 0.1–1.0)
Monocytes Relative: 12 %
Neutro Abs: 6.2 10*3/uL (ref 1.7–7.7)
Neutrophils Relative %: 67 %
Platelets: 183 10*3/uL (ref 150–400)
RBC: 5.9 MIL/uL — ABNORMAL HIGH (ref 4.22–5.81)
RDW: 16 % — ABNORMAL HIGH (ref 11.5–15.5)
WBC: 9.3 10*3/uL (ref 4.0–10.5)
nRBC: 0 % (ref 0.0–0.2)

## 2019-07-09 LAB — COMPREHENSIVE METABOLIC PANEL
ALT: 34 U/L (ref 0–44)
AST: 40 U/L (ref 15–41)
Albumin: 3 g/dL — ABNORMAL LOW (ref 3.5–5.0)
Alkaline Phosphatase: 73 U/L (ref 38–126)
Anion gap: 11 (ref 5–15)
BUN: 14 mg/dL (ref 8–23)
CO2: 31 mmol/L (ref 22–32)
Calcium: 9.6 mg/dL (ref 8.9–10.3)
Chloride: 100 mmol/L (ref 98–111)
Creatinine, Ser: 1.21 mg/dL (ref 0.61–1.24)
GFR calc Af Amer: >60 mL/min (ref >60)
GFR calc non Af Amer: 54 mL/min — ABNORMAL LOW (ref >60)
Glucose, Bld: 207 mg/dL — ABNORMAL HIGH (ref 70–99)
Potassium: 3.6 mmol/L (ref 3.5–5.1)
Sodium: 142 mmol/L (ref 135–145)
Total Bilirubin: 1.1 mg/dL (ref 0.3–1.2)
Total Protein: 6.8 g/dL (ref 6.5–8.1)

## 2019-07-09 LAB — CULTURE, BLOOD (ROUTINE X 2)
Culture: NO GROWTH
Culture: NO GROWTH
Special Requests: ADEQUATE
Special Requests: ADEQUATE

## 2019-07-09 LAB — GLUCOSE, CAPILLARY
Glucose-Capillary: 197 mg/dL — ABNORMAL HIGH (ref 70–99)
Glucose-Capillary: 164 mg/dL — ABNORMAL HIGH (ref 70–99)
Glucose-Capillary: 165 mg/dL — ABNORMAL HIGH (ref 70–99)

## 2019-07-09 LAB — MAGNESIUM: Magnesium: 2 mg/dL (ref 1.7–2.4)

## 2019-07-09 LAB — PHOSPHORUS: Phosphorus: 2.8 mg/dL (ref 2.5–4.6)

## 2019-07-09 MED ORDER — SIMVASTATIN 20 MG PO TABS: 20.0000 mg | ORAL_TABLET | Freq: Every day | ORAL | Status: DC

## 2019-07-09 MED ORDER — AMITRIPTYLINE HCL 10 MG PO TABS
10.0000 mg | ORAL_TABLET | Freq: Every day | ORAL | Status: DC
  Filled 2019-07-09: qty 1

## 2019-07-09 MED ORDER — CEPHALEXIN 500 MG PO CAPS: 500.0000 mg | ORAL_CAPSULE | Freq: Two times a day (BID) | ORAL | Status: AC

## 2019-07-09 MED ORDER — CEPHALEXIN 500 MG PO CAPS: 500.0000 mg | ORAL_CAPSULE | Freq: Two times a day (BID) | ORAL | Status: DC

## 2019-07-09 MED ORDER — CLOPIDOGREL BISULFATE 75 MG PO TABS
75.0000 mg | ORAL_TABLET | Freq: Every day | ORAL | Status: DC
  Administered 2019-07-09: 75 mg via ORAL
  Filled 2019-07-09: qty 1

## 2019-07-09 MED ORDER — SENNOSIDES-DOCUSATE SODIUM 8.6-50 MG PO TABS
1.0000 | ORAL_TABLET | Freq: Two times a day (BID) | ORAL | Status: DC
  Administered 2019-07-09: 1 via ORAL
  Filled 2019-07-09: qty 1

## 2019-07-09 NOTE — Progress Notes (Addendum)
Inpatient Diabetes Program Recommendations  AACE/ADA: New Consensus Statement on Inpatient Glycemic Control (2015)  Target Ranges:  Prepandial:   less than 140 mg/dL      Peak postprandial:   less than 180 mg/dL (1-2 hours)      Critically ill patients:  140 - 180 mg/dL   Results for QUADRY, KAMPA (MRN [id number]) as of 07/09/2019 09:16  Ref. Range 07/08/2019 06:19 07/08/2019 11:55 07/08/2019 19:03 07/08/2019 23:25 07/09/2019 06:02  Glucose-Capillary Latest Ref Range: 70 - 99 mg/dL 174 (H) 213 (H) 222 (H) 307 (H) 197 (H)    Results for Lauman, JAYDN MOSCATO (MRN [id number]) as of 07/04/2019 10:20  Ref. Range 06/08/2019 06:15  Hemoglobin A1C Latest Ref Range: 4.8 - 5.6 % 8.2 (H)    Admit with: AMS/ CP/ Acute Renal Failure  History: DM  SNF DM Meds: Lantus 10 units Daily     Novolog 5 units TID with meals  Current Orders: CBG checks Q6 hours   MD- Please consider starting Novolog Sensitive Correction Scale/ SSI (0-9 units) tid + Novolog 0-5 units qhs for bedtime coverage.   --Will follow patient during hospitalization--  Tama Headings RN, MSN, BC-ADM Inpatient Diabetes Coordinator Team Pager [phone] (8a-5p)

## 2019-07-09 NOTE — Consult Note (Signed)
   [facility] Inpatient Consult   07/09/2019  Jared Hall [birth year] [id number]    Patient screened for potential need of [facility] care management services with a 30 day readmission, 2 hospitalizations in the past 6 months and 19% risk scorefor unplanned readmission, under his Medicare/ Next[employer] plan. Patient had been engaged with [facility] care management coordinators in distant past. Our community based plan of care has focused on disease management and community resource support.  Review of medical record and MD history and physical dated 07/03/19 show as: SAED HUDLOW is a 85 y.o. male with medical history significant of hypertension, hyperlipidemia, diabetes mellitus type 2, history of stroke, paroxysmal atrial fibrillation not on anticoagulation, OSA, and alpha thalassemia;    who presentedto his ALFon 8/13/2020withreports of chest pain and worsening confusion and was found to haveE. coli/Proteus UTI complicated by AKI and acute metabolic encephalopathy.    Palliative care was consulted given that patient did have decreased motivation for eating. Patient to continue dysphagia 1 diet- intermittently eating.  Per PT/ OT evaluation note, patient is recommended for SNF (skilled nursing facility for skilled level rehab), however, patient and family would prefer if patient can return to [facility]; and daughter in- law [name]) says that they have reached out to [facility] and are able to take him back and do therapy there.  Review of transition of care notes, reveal that patient will transition back to his ALF ([facility] where he lived for the past 3 years) with PT/ OT rehab along with palliative outpatient services ([facility]- aware).  No [facility] care management needs noted at this point. His primary care provider is Dr. Josetta Huddle with [facility], listed to provide transition of care follow-up.   For questions and additional information, please call:   Ilamae Geng A. Toryn Mcclinton, BSN, RN-BC [facility] Liaison Cell: [phone]

## 2019-07-09 NOTE — Progress Notes (Signed)
Manufacturing engineer [facility]) Community Palliative Care  Referral received from Lissa Morales, for palliative services at [facility].    ACC will reach out to [facility] and schedule a time to begin palliative services.  Thank you, Venia [place] RN, BSN, [facility] Liaison (in [place]) [phone]

## 2019-07-09 NOTE — Progress Notes (Signed)
Patient being discharged to [facility].  Patient to be transported by ambulance.  IV's removed with the catheters intact.  Discharge instructions given and placed in the packet for [facility].  Report given to [facility].

## 2019-07-09 NOTE — Progress Notes (Signed)
Patient discharged via [facility] to [place] patient's belongings sent with patient

## 2019-07-09 NOTE — Plan of Care (Signed)
Plan of care adequate for discharge.

## 2019-07-09 NOTE — Progress Notes (Signed)
Patient ID: Jared Hall, male   DOB: 1930-12-17, 85 y.o.   MRN: [id number]  This NP visited patient at the bedside as a follow up to  yesterday's [place].   Patient is resting comfortably and appears to be in no acute distress.  Spoke to daughter in law/Jared Hall as follow-up to yesterday's goals of care meeting.  Continued conversation regarding current medical situation; multiple comorbidities, goals of care and anticipatory care needs.  Family is hopeful for patient to return back to his assisted living[facility] with outpatient palliative care services to follow.  Detailed palliative services versus hospice benefit.  Family understand the high risk for decompensation but continue to hope for improvement in prolonged quality of life.  Discussed with family  the importance of continued conversation with facility and their  medical providers regarding overall plan of care and treatment options,  ensuring decisions are within the context of the patients values and GOCs.  Questions and concerns addressed   Discussed with Dr Lonny Prude and LCSW  Total time spent on the unit was 25 minutes  Greater than 50% of the time was spent in counseling and coordination of care  Wadie Lessen NP  Palliative Medicine Team Team Phone # [phone] Pager [phone]

## 2019-07-09 NOTE — TOC Progression Note (Signed)
Transition of Care [facility]) - Progression Note    Patient Details  Name: Jared Hall MRN: [id number] Date of Birth: 1931-08-28  Transition of Care [facility]) CM/SW [place], [place] Phone Number: 07/09/2019, 12:05 PM  Clinical Narrative:   CSW met with patient and daughter-in-law [name] at bedside to discuss discharge plan. Patient and family would prefer if patient can return to [facility], and [name] says that they have reached out to [facility] and they are able to take him back and do therapy there. [name] said they cannot offer speech, but the family doesn't notice any serious change in his speech quality and they would rather him return to [facility] than receive speech therapy.   CSW reached out to [facility] and spoke with Dedra Skeens, Scientist, physiological. CSW read out the patient's current performance with therapy and Alma confirmed that they would be able to handle him. Patient will need home health orders to return. [facility] prefers palliative service through [facility] and they will set up therapies in house.   Patient can return to [facility]. CSW to follow.    Expected Discharge Plan: [place] Barriers to Discharge: Continued Medical Work up  Expected Discharge Plan and Services Expected Discharge Plan: [place] In-house Referral: Clinical Social Work Discharge Planning Services: NA Post Acute Care Choice: [place] Living arrangements for the past 2 months: [place]                 DME Arranged: N/A DME Agency: NA       HH Arranged: NA [place] Agency: NA         Social Determinants of Health (SDOH) Interventions    Readmission Risk Interventions Readmission Risk Prevention Plan 06/09/2019  Transportation Screening Complete  PCP or Specialist Appt within 3-5 Days Not Complete  Not Complete comments not ready for d/c, plan to go to SNF  [place] or [place] Complete   Social Work Consult for [place] Planning/Counseling Complete  Palliative Care Screening Not Applicable  Medication Review Press photographer) Complete  Some recent data might be hidden

## 2019-07-09 NOTE — NC FL2 (Signed)
[place] MEDICAID FL2 LEVEL OF CARE SCREENING TOOL     IDENTIFICATION  Patient Name: Jared Hall Birthdate: 1931-07-03 Sex: male Admission Date (Current Location): 07/03/2019  [facility] and [place] Number:  Herbalist and Address:  [facility], [place] [address] [place], [place] [address]      Provider Number: [id number]  Attending Physician Name and Address:  Desiree Hane, MD  Relative Name and Phone Number:  Xsavier Seeley, [name] Leiter and Daughter-in-law, [phone]    Current Level of Care: Hospital Recommended Level of Care: [place] Prior Approval Number:    Date Approved/Denied:   PASRR Number: [id number]  Discharge Plan: Other (Comment)(ALF)    Current Diagnoses: Patient Active Problem List   Diagnosis Date Noted  . Palliative care by specialist   . DNR (do not resuscitate)   . Leukocytosis 07/03/2019  . Acute renal failure superimposed on chronic kidney disease ([place]) 07/03/2019  . Chest pain 07/03/2019  . Urinary tract infection 07/03/2019  . Hypokalemia 06/02/2019  . TIA (transient ischemic attack) 05/22/2018  . Cellulitis in diabetic foot ([place]) 05/22/2018  . Tremor 10/17/2017  . Acute encephalopathy   . Acute ischemic stroke ([place])   . Weakness generalized   . Cerebrovascular disease   . Altered mental status   . Acute respiratory failure with hypercapnia ([place])   . CVA (cerebral vascular accident) ([place]) 01/01/2017  . Dehydration 01/01/2017  . Anemia 01/01/2017  . Acute respiratory failure ([employer]) 12/15/2016  . Morbid obesity ([place]) 12/15/2016  . Hard of hearing 12/15/2016  . Chronic kidney disease, stage III (moderate) ([place]) 12/06/2016  . Bell's palsy   . Facial droop   . Paroxysmal atrial fibrillation (HCC)   . Embolic stroke ([place])   . Dysphagia 03/14/2015  . Dysarthria 03/14/2015  . Stroke ([place]) 03/14/2015  . Cellulitis 01/11/2014  . Bilateral lower extremity edema 01/11/2014  . Venous stasis  dermatitis 01/11/2014  . DM type 2 with diabetic peripheral neuropathy ([place]) 01/11/2014  . Cellulitis and abscess of leg 01/11/2014  . Cellulitis and abscess 01/11/2014  . Depression 06/15/2013  . GERD (gastroesophageal reflux disease) 06/15/2013  . Constipation 06/15/2013  . Diabetic neuropathy ([place]) 06/15/2013  . Type 1 diabetes mellitus with peripheral circulatory complications ([place]) 05/31/1974  . Essential hypertension, benign 04/07/2013  . AKI (acute kidney injury) ([place]) 04/04/2013  . Bilateral lower leg cellulitis 04/04/2013  . Right hip pain 04/04/2013  . Renal insufficiency 12/16/2011  . History of CVA (cerebrovascular accident) 12/16/2011  . Dyslipidemia 12/16/2011  . Obesity (BMI 30-39.9) 12/16/2011  . Umbilical hernia with obstruction-partial on CT scan; easily reducible 12/14/2011  . History of PSVT (paroxysmal supraventricular tachycardia) 12/14/2011  . DM (diabetes mellitus) ([place]) 12/14/2011    Orientation RESPIRATION BLADDER Height & Weight     Self(Disorientated x4)  O2([place] 2L) Incontinent Weight: 246 lb 14.6 oz (112 kg) Height:     BEHAVIORAL SYMPTOMS/MOOD NEUROLOGICAL BOWEL NUTRITION STATUS      Incontinent Diet(puree solids, nectar thick liquids)  AMBULATORY STATUS COMMUNICATION OF NEEDS Skin   Extensive Assist Verbally Other (Comment)(Cellulitis on leg, MASD on abdomen)                       Personal Care Assistance Level of Assistance  Bathing, Feeding, Dressing, Total care Bathing Assistance: Maximum assistance Feeding assistance: Limited assistance Dressing Assistance: Maximum assistance Total Care Assistance: Maximum assistance   Functional Limitations Info  Sight, Hearing, Speech Sight  Info: Adequate Hearing Info: Impaired(hard of hearing) Speech Info: Impaired(dysarthria)    SPECIAL CARE FACTORS FREQUENCY  PT (By licensed PT), OT (By licensed OT)     PT Frequency: 5x/wk OT Frequency: 5x/wk            Contractures Contractures  Info: Not present    Additional Factors Info  Code Status, Allergies Code Status Info: DNR Allergies Info: Ativan (Lorazepam), Flomax (Tamsulosin Hcl), Glucophage (Metformin Hcl)           Current Medications (07/09/2019):  This is the current hospital active medication list Current Facility-Administered Medications  Medication Dose Route Frequency Provider Last Rate Last Dose  . 0.9 %  sodium chloride infusion   Intravenous PRN Fuller Plan A, MD 10 mL/hr at 07/04/19 0500    . acetaminophen (TYLENOL) tablet 650 mg  650 mg Oral Q6H PRN Fuller Plan A, MD       Or  . acetaminophen (TYLENOL) suppository 650 mg  650 mg Rectal Q6H PRN Fuller Plan A, MD      . antiseptic oral rinse (BIOTENE) solution 15 mL  15 mL Mouth Rinse TID Raiford Noble Latif, DO   15 mL at 07/08/19 2112  . cefTRIAXone (ROCEPHIN) 1 g in sodium chloride 0.9 % 100 mL IVPB  1 g Intravenous Q24H Smith, Rondell A, MD 200 mL/hr at 07/08/19 1612 1 g at 07/08/19 1612  . feeding supplement (ENSURE ENLIVE) (ENSURE ENLIVE) liquid 237 mL  237 mL Oral TID BM Raiford Noble [place], DO   237 mL at 07/08/19 2111  . Gerhardt's butt cream   Topical TID Raiford Noble Latif, DO      . heparin injection 5,000 Units  5,000 Units Subcutaneous Q8H Norval Morton, MD   5,000 Units at 07/09/19 [phone]  . ondansetron (ZOFRAN) tablet 4 mg  4 mg Oral Q6H PRN Fuller Plan A, MD       Or  . ondansetron (ZOFRAN) injection 4 mg  4 mg Intravenous Q6H PRN Smith, Rondell A, MD      . phosphorus (K PHOS NEUTRAL) tablet 500 mg  500 mg Oral BID Raiford Noble [place], DO   500 mg at 07/08/19 2111  . Resource ThickenUp Clear   Oral PRN Raiford Noble Latif, DO      . sodium chloride flush (NS) 0.9 % injection 3 mL  3 mL Intravenous Q12H Fuller Plan A, MD   3 mL at 07/08/19 2117     Discharge Medications: Please see discharge summary for a list of discharge medications.  Relevant Imaging Results:  Relevant Lab Results:   Additional  Information SSN: [id number]; Palliative care to be set up to follow at [place], LCSW

## 2019-07-09 NOTE — Plan of Care (Signed)
Progressing towards goals

## 2019-07-09 NOTE — Discharge Summary (Addendum)
CLEVELAND YARBRO PTW:[id number] DOB: [birth year] DOA: 07/03/2019  PCP: Josetta Huddle, MD  Admit date: 07/03/2019 Discharge date: 07/09/2019  Admitted From: ALF Disposition:  ALF Recommendations for Outpatient Follow-up:  1. Follow up with PCP in 1-2 weeks--for volume status check (advised to discontinue metolazone, Lasix, potassium supplementation given not volume overloaded). 2. Have discontinued beta-blocker due to bradycardia 3. Keflex x1 day ( end date 8/20) to complete 7 total days of therapy for UTI 4. Please obtain BMP in one week 5. Please follow up on the following pending results:  Home Health: Home health physical therapy/Occupational Therapy and palliative outpatient services Equipment/Devices:none   Discharge Condition: Stable CODE STATUS: DNR Diet recommendation: Dysphagia 1 (puree);Nectar-thick liquid  Brief/Interim Summary: History of present illness:  Jared KLOOS is a 85 y.o. year old male with medical history significant for hypertension, hyperlipidemia, diabetes mellitus type 2, history of stroke, paroxysmal atrial fibrillationnot on anticoagulation, OSA, andalphathalassemia who presented to his ALF on 07/03/2019 with reports of chest pain and worsening confusion and was found to have E. coli/Proteus UTI complicated by AKI and acute metabolic encephalopathy. Remaining hospital course addressed in problem based format below:   Hospital Course:   E. coli/Proteus UTI.  Completed 6 days of IV ceftriaxone. Remained afebrile and improvement with urinary symptoms on that course.  On discharge will continue 1 day of Keflex (end date 7/00)  Acute metabolic encephalopathy, resolved.  Increased lethargy and somnolence on admission presumed related to infectious process/UTI mentioned above.  CT head on admission showed no acute intracranial abnormalities.  Speech recommends dysphagia 1 diet, aspiration precautions.  At baseline patient alert to self (suspect dementia,?  Vascular  related to prior history of stroke), able to follow commands, limited by difficulty with gross hardening hearing.  AKI on CKD stage III, likely prerenal in the setting of infection/UTI.  Has improved from peak of 2.8 to return to baseline of 1.1-1.2, continue to avoid nephrotoxins.  Bradycardia/first-degree AV block, resolved.  On TTE preserved EF.  Cardiology recommendedto hold home Lopressor and avoid AV nodal blocking agents and continue to follow up with his outpatient cardiologist  Abdominal pain, resolved CT abdomen showed no acute abnormalities  Atypical chest pain, resolved.  Not consistent with ACS, high sensitivity troponin peaked 28 and down trended 26  Goals of care discussions.  Palliative care was consulted given patient did have decreased motivation for eating.  Patient to continue dysphagia 1 diet, intermittently eating After discussion patient family prefers transition back to ALF with outpatient palliative care services as needed  Diastolic CHF, volume status stable.  No need for diuresis . Hold furosemide and metolazone and reassess volume status on discharge in next 3 to 5 days.  Type 2 diabetes, controlled, A1c 8.2 (05/2011), continue Lantus, monitor CBGs, sliding scale as needed.  Hypertension.  Holding beta-blocker in setting of transient bradycardia  History of stroke continue aspirin, simvastatin, clopidogrel   Consultations:  Palliative, speech therapy  Procedures/Studies: TTE, 07/04/2019: 1. The left ventricle has normal systolic function with an ejection fraction of 60-65%. The cavity size was normal. There is severe asymmetric left ventricular hypertrophy. Left ventricular diastolic Doppler parameters are consistent with impaired  relaxation. No evidence of left ventricular regional wall motion abnormalities.  2. The right ventricle has normal systolic function. The cavity was normal. There is no increase in right ventricular wall thickness. Right  ventricular systolic pressure could not be assessed.  3. There is moderate mitral annular calcification present.  4. The aortic valve  is tricuspid. Severely thickening of the aortic valve. Severe calcifcation of the aortic valve. Aortic valve regurgitation is trivial by color flow Doppler.  5. The aorta is abnormal unless otherwise noted.  6. There is mild dilatation of the aortic root measuring 40 mm.  7. The interatrial septum appears to be lipomatous.   Subjective: Denies any pain.  Able to follow commands.  Very hard of hearing.   Discharge Exam: Vitals:   07/09/19 1100 07/09/19 1200  BP:    Pulse: 81   Resp:    Temp:  98.2 F (36.8 C)  SpO2:     Vitals:   07/09/19 0733 07/09/19 0800 07/09/19 1100 07/09/19 1200  BP:  (!) 155/74    Pulse:   81   Resp:      Temp:  97.6 F (36.4 C)  98.2 F (36.8 C)  TempSrc:  Oral  Oral  SpO2: (!) 76%     Weight:        General: Elderly male, lying in bed in no distress Eyes: EOMI, anicteric ENT: Oral Mucosa clear and moist Cardiovascular: regular rate and rhythm, no murmurs, rubs or gallops, no edema, Respiratory: Normal respiratory effort on room air, lungs clear to auscultation bilaterally Abdomen: soft, non-distended, non-tender, normal bowel sounds Skin: No Rash Neurologic: Grossly no focal neuro deficit.Mental status alert to self, place (she is able to state he is in [place]), very hard of hearing, able to follow some commands (give thumbs up sign, wiggle toes and hands), dysarthric Psychiatric:Appropriate affect, and mood  Discharge Diagnoses:  Principal Problem:   Acute encephalopathy Active Problems:   DM (diabetes mellitus) ([place])   Weakness generalized   Hypokalemia   Leukocytosis   Acute renal failure superimposed on chronic kidney disease (HCC)   Chest pain   Urinary tract infection   Palliative care by specialist   DNR (do not resuscitate)    Discharge Instructions  Discharge Instructions    Diet -  low sodium heart healthy   Complete by: As directed    Increase activity slowly   Complete by: As directed      Allergies as of 07/09/2019      Reactions   Ativan [lorazepam] Other (See Comments)   Flailing of extremities noted immediately s/p IV administration. "Allergic," per [facility]   Flomax [tamsulosin Hcl] Other (See Comments)   Excessive tearing; "Allergic," per [facility]   Glucophage [metformin Hcl] Diarrhea, Other (See Comments)   "Allergic," per [facility]      Medication List    STOP taking these medications   furosemide 80 MG tablet Commonly known as: LASIX   metolazone 5 MG tablet Commonly known as: ZAROXOLYN   metoprolol tartrate 25 MG tablet Commonly known as: LOPRESSOR   potassium chloride SA 20 MEQ tablet Commonly known as: K-DUR     TAKE these medications   acetaminophen 500 MG tablet Commonly known as: TYLENOL Take 500 mg by mouth every 6 (six) hours as needed for fever.   amitriptyline 10 MG tablet Commonly known as: ELAVIL Take 10 mg by mouth at bedtime.   aspirin EC 81 MG tablet Take 81 mg by mouth daily.   azelastine 0.05 % ophthalmic solution Commonly known as: OPTIVAR Place 1 drop into both eyes 2 (two) times daily as needed (dryness or itching).   Cepacol 15-2.3 MG Lozg Generic drug: Benzocaine-Menthol Use as directed 1 lozenge in the mouth or throat 4 (four) times daily as needed (for soreness).   cephALEXin 500 MG capsule  Commonly known as: KEFLEX Take 1 capsule (500 mg total) by mouth every 12 (twelve) hours for 1 day. Start taking on: July 10, 2019   clopidogrel 75 MG tablet Commonly known as: PLAVIX Take 75 mg by mouth daily.   insulin glargine 100 UNIT/ML injection Commonly known as: LANTUS Inject 0.1 mLs (10 Units total) into the skin daily.   NovoLOG FlexPen 100 UNIT/ML FlexPen Generic drug: insulin aspart Inject 5 Units into the skin See admin instructions. Inject 5 units into the skin three times a day with meals and "check sugar  two times a day and use sliding scale coverage as follows: BGL 150 = give nothing; 151-200 = 7 units; 201-250 = 9 units; 251-300 = 11 units; 301-350 = 13 units; 351-400 = 15 units"   pantoprazole 40 MG tablet Commonly known as: PROTONIX Take 40 mg by mouth daily.   senna-docusate 8.6-50 MG tablet Commonly known as: Senokot-S Take 1 tablet by mouth 2 (two) times daily.   simvastatin 20 MG tablet Commonly known as: ZOCOR Take 20 mg by mouth daily at 8 pm.   Systane 0.4-0.3 % Gel ophthalmic gel Generic drug: Polyethyl Glycol-Propyl Glycol Place 1 application into the right eye See admin instructions. Pull down the lower right eyelid and apply a small amount three times a day as needed for dryness   traMADol 50 MG tablet Commonly known as: ULTRAM Take 1 tablet (50 mg total) by mouth every 6 (six) hours as needed for moderate pain. What changed: reasons to take this   vitamin B-12 500 MCG tablet Commonly known as: CYANOCOBALAMIN Take 500 mcg by mouth daily.   Vitamin D3 50 MCG (2000 UT) capsule Take 2,000 Units by mouth daily.       Allergies  Allergen Reactions  . Ativan [Lorazepam] Other (See Comments)    Flailing of extremities noted immediately s/p IV administration. "Allergic," per [facility]  . Flomax [Tamsulosin Hcl] Other (See Comments)    Excessive tearing; "Allergic," per [facility]  . Glucophage [Metformin Hcl] Diarrhea and Other (See Comments)    "Allergic," per [facility]        The results of significant diagnostics from this hospitalization (including imaging, microbiology, ancillary and laboratory) are listed below for reference.     Microbiology: Recent Results (from the past 240 hour(s))  SARS Coronavirus 2 [facility] order, Performed in [facility] hospital lab) Nasopharyngeal Nasopharyngeal Swab     Status: None   Collection Time: 07/03/19 11:22 AM   Specimen: Nasopharyngeal Swab  Result Value Ref Range Status   SARS Coronavirus 2 NEGATIVE NEGATIVE Final    Comment:  (NOTE) If result is NEGATIVE SARS-CoV-2 target nucleic acids are NOT DETECTED. The SARS-CoV-2 RNA is generally detectable in upper and lower  respiratory specimens during the acute phase of infection. The lowest  concentration of SARS-CoV-2 viral copies this assay can detect is 250  copies / mL. A negative result does not preclude SARS-CoV-2 infection  and should not be used as the sole basis for treatment or other  patient management decisions.  A negative result may occur with  improper specimen collection / handling, submission of specimen other  than nasopharyngeal swab, presence of viral mutation(s) within the  areas targeted by this assay, and inadequate number of viral copies  (<250 copies / mL). A negative result must be combined with clinical  observations, patient history, and epidemiological information. If result is POSITIVE SARS-CoV-2 target nucleic acids are DETECTED. The SARS-CoV-2 RNA is generally detectable in upper  and lower  respiratory specimens dur ing the acute phase of infection.  Positive  results are indicative of active infection with SARS-CoV-2.  Clinical  correlation with patient history and other diagnostic information is  necessary to determine patient infection status.  Positive results do  not rule out bacterial infection or co-infection with other viruses. If result is PRESUMPTIVE POSTIVE SARS-CoV-2 nucleic acids MAY BE PRESENT.   A presumptive positive result was obtained on the submitted specimen  and confirmed on repeat testing.  While 2019 novel coronavirus  (SARS-CoV-2) nucleic acids may be present in the submitted sample  additional confirmatory testing may be necessary for epidemiological  and / or clinical management purposes  to differentiate between  SARS-CoV-2 and other Sarbecovirus currently known to infect humans.  If clinically indicated additional testing with an alternate test  methodology [phone]) is advised. The SARS-CoV-2 RNA is  generally  detectable in upper and lower respiratory sp ecimens during the acute  phase of infection. The expected result is Negative. Fact Sheet for Patients:  StrictlyIdeas.no Fact Sheet for Healthcare Providers: BankingDealers.co.za This test is not yet approved or cleared by the [place] FDA and has been authorized for detection and/or diagnosis of SARS-CoV-2 by FDA under an Emergency Use Authorization (EUA).  This EUA will remain in effect (meaning this test can be used) for the duration of the COVID-19 declaration under Section 564(b)(1) of the Act, 21 U.S.C. section 360bbb-3(b)(1), unless the authorization is terminated or revoked sooner. Performed at [facility], [place] [address] [place], [place] [address]   Urine culture     Status: Abnormal   Collection Time: 07/03/19  1:30 PM   Specimen: Urine, Random  Result Value Ref Range Status   Specimen Description URINE, RANDOM  Final   Special Requests   Final    NONE Performed at [facility], [place] [address] [place], [place] [address]    Culture (A)  Final    >=100,000 COLONIES/mL ESCHERICHIA COLI 10,000 COLONIES/mL PROTEUS MIRABILIS    Report Status 07/08/2019 FINAL  Final   Organism ID, Bacteria ESCHERICHIA COLI (A)  Final   Organism ID, Bacteria PROTEUS MIRABILIS (A)  Final      Susceptibility   Escherichia coli - MIC*    AMPICILLIN 8 SENSITIVE Sensitive     CEFAZOLIN <=4 SENSITIVE Sensitive     CEFTRIAXONE <=1 SENSITIVE Sensitive     CIPROFLOXACIN <=0.25 SENSITIVE Sensitive     GENTAMICIN <=1 SENSITIVE Sensitive     IMIPENEM <=0.25 SENSITIVE Sensitive     NITROFURANTOIN <=16 SENSITIVE Sensitive     TRIMETH/SULFA <=20 SENSITIVE Sensitive     AMPICILLIN/SULBACTAM 4 SENSITIVE Sensitive     PIP/TAZO <=4 SENSITIVE Sensitive     Extended ESBL NEGATIVE Sensitive     * >=100,000 COLONIES/mL ESCHERICHIA COLI   Proteus mirabilis - MIC*    AMPICILLIN <=2  SENSITIVE Sensitive     CEFAZOLIN <=4 SENSITIVE Sensitive     CEFTRIAXONE <=1 SENSITIVE Sensitive     CIPROFLOXACIN <=0.25 SENSITIVE Sensitive     GENTAMICIN <=1 SENSITIVE Sensitive     IMIPENEM 2 SENSITIVE Sensitive     NITROFURANTOIN 128 RESISTANT Resistant     TRIMETH/SULFA <=20 SENSITIVE Sensitive     AMPICILLIN/SULBACTAM <=2 SENSITIVE Sensitive     PIP/TAZO <=4 SENSITIVE Sensitive     * 10,000 COLONIES/mL PROTEUS MIRABILIS  Culture, blood (routine x 2)     Status: None   Collection Time: 07/04/19  9:25 AM  Specimen: BLOOD  Result Value Ref Range Status   Specimen Description BLOOD LEFT ANTECUBITAL  Final   Special Requests AEROBIC BOTTLE ONLY Blood Culture adequate volume  Final   Culture   Final    NO GROWTH 5 DAYS Performed at [facility], [address] [place], [place] [address]    Report Status 07/09/2019 FINAL  Final  Culture, blood (routine x 2)     Status: None   Collection Time: 07/04/19  9:25 AM   Specimen: BLOOD  Result Value Ref Range Status   Specimen Description BLOOD LEFT ANTECUBITAL  Final   Special Requests AEROBIC BOTTLE ONLY Blood Culture adequate volume  Final   Culture   Final    NO GROWTH 5 DAYS Performed at [facility] [address], [place], [place] [address]    Report Status 07/09/2019 FINAL  Final  Culture, Urine     Status: Abnormal   Collection Time: 07/04/19 10:46 AM   Specimen: Urine, Random  Result Value Ref Range Status   Specimen Description URINE, RANDOM  Final   Special Requests NONE  Final   Culture (A)  Final    <10,000 COLONIES/mL INSIGNIFICANT GROWTH Performed at [facility] [address], [place], [place] [address]    Report Status 07/05/2019 FINAL  Final     Labs: BNP (last 3 results) No results for input(s): BNP in the last 8760 hours. Basic Metabolic Panel: Recent Labs  Lab 07/05/19 0329 07/06/19 0546 07/07/19 1008 07/08/19 0320 07/09/19 0847  NA 141 141 141 140 142  K 3.0* 3.4* 3.7  4.7 3.6  CL 95* 97* 97* 99 100  CO2 32 32 28 28 31   GLUCOSE 133* 180* 163* 178* 207*  BUN 26* 18 12 13 14   CREATININE 1.59* 1.25* 1.10 1.29* 1.21  CALCIUM 9.4 9.4 9.5 9.6 9.6  MG 2.1 1.8 1.7 2.1 2.0  PHOS 1.7* 1.8* 2.1* 2.2* 2.8   Liver Function Tests: Recent Labs  Lab 07/05/19 0329 07/06/19 0546 07/07/19 1008 07/08/19 0320 07/09/19 0847  AST 22 21 23 26  40  ALT 11 11 15 16  34  ALKPHOS 58 65 69 65 73  BILITOT 0.9 0.6 0.7 1.0 1.1  PROT 6.1* 6.6 6.8 6.4* 6.8  ALBUMIN 2.8* 3.0* 3.2* 3.0* 3.0*   Recent Labs  Lab 07/03/19 1121  LIPASE 46   Recent Labs  Lab 07/03/19 1156  AMMONIA 13   CBC: Recent Labs  Lab 07/06/19 0546 07/07/19 1008 07/08/19 0320 07/08/19 1657 07/09/19 0847  WBC 8.5 8.5 9.4 9.8 9.3  NEUTROABS 6.0 6.1 6.7 7.3 6.2  HGB 12.3* 12.9* 12.3* 12.2* 12.5*  HCT 40.8 43.3 40.3 40.1 42.1  MCV 71.5* 71.2* 70.2* 70.4* 71.4*  PLT 175 213 205 192 183   Cardiac Enzymes: No results for input(s): CKTOTAL, CKMB, CKMBINDEX, TROPONINI in the last 168 hours. BNP: Invalid input(s): POCBNP CBG: Recent Labs  Lab 07/08/19 1155 07/08/19 1903 07/08/19 2325 07/09/19 0602 07/09/19 1158  GLUCAP 213* 222* 307* 197* 164*   D-Dimer No results for input(s): DDIMER in the last 72 hours. Hgb A1c No results for input(s): HGBA1C in the last 72 hours. Lipid Profile No results for input(s): CHOL, HDL, LDLCALC, TRIG, CHOLHDL, LDLDIRECT in the last 72 hours. Thyroid function studies No results for input(s): TSH, T4TOTAL, T3FREE, THYROIDAB in the last 72 hours.  Invalid input(s): FREET3 Anemia work up No results for input(s): VITAMINB12, FOLATE, FERRITIN, TIBC, IRON, RETICCTPCT in the last 50  hours. Urinalysis    Component Value Date/Time   COLORURINE YELLOW 07/03/2019 1121   APPEARANCEUR CLOUDY (A) 07/03/2019 1121   LABSPEC 1.006 07/03/2019 1121   PHURINE 7.0 07/03/2019 1121   GLUCOSEU NEGATIVE 07/03/2019 1121   HGBUR SMALL (A) 07/03/2019 1121   BILIRUBINUR  NEGATIVE 07/03/2019 1121   KETONESUR NEGATIVE 07/03/2019 1121   PROTEINUR NEGATIVE 07/03/2019 1121   UROBILINOGEN 1.0 12/06/2014 1414   NITRITE NEGATIVE 07/03/2019 1121   LEUKOCYTESUR LARGE (A) 07/03/2019 1121   Sepsis Labs Invalid input(s): PROCALCITONIN,  WBC,  LACTICIDVEN Microbiology Recent Results (from the past 240 hour(s))  SARS Coronavirus 2 [facility] order, Performed in [facility] hospital lab) Nasopharyngeal Nasopharyngeal Swab     Status: None   Collection Time: 07/03/19 11:22 AM   Specimen: Nasopharyngeal Swab  Result Value Ref Range Status   SARS Coronavirus 2 NEGATIVE NEGATIVE Final    Comment: (NOTE) If result is NEGATIVE SARS-CoV-2 target nucleic acids are NOT DETECTED. The SARS-CoV-2 RNA is generally detectable in upper and lower  respiratory specimens during the acute phase of infection. The lowest  concentration of SARS-CoV-2 viral copies this assay can detect is 250  copies / mL. A negative result does not preclude SARS-CoV-2 infection  and should not be used as the sole basis for treatment or other  patient management decisions.  A negative result may occur with  improper specimen collection / handling, submission of specimen other  than nasopharyngeal swab, presence of viral mutation(s) within the  areas targeted by this assay, and inadequate number of viral copies  (<250 copies / mL). A negative result must be combined with clinical  observations, patient history, and epidemiological information. If result is POSITIVE SARS-CoV-2 target nucleic acids are DETECTED. The SARS-CoV-2 RNA is generally detectable in upper and lower  respiratory specimens dur ing the acute phase of infection.  Positive  results are indicative of active infection with SARS-CoV-2.  Clinical  correlation with patient history and other diagnostic information is  necessary to determine patient infection status.  Positive results do  not rule out bacterial infection or co-infection with  other viruses. If result is PRESUMPTIVE POSTIVE SARS-CoV-2 nucleic acids MAY BE PRESENT.   A presumptive positive result was obtained on the submitted specimen  and confirmed on repeat testing.  While 2019 novel coronavirus  (SARS-CoV-2) nucleic acids may be present in the submitted sample  additional confirmatory testing may be necessary for epidemiological  and / or clinical management purposes  to differentiate between  SARS-CoV-2 and other Sarbecovirus currently known to infect humans.  If clinically indicated additional testing with an alternate test  methodology [phone]) is advised. The SARS-CoV-2 RNA is generally  detectable in upper and lower respiratory sp ecimens during the acute  phase of infection. The expected result is Negative. Fact Sheet for Patients:  StrictlyIdeas.no Fact Sheet for Healthcare Providers: BankingDealers.co.za This test is not yet approved or cleared by the [place] FDA and has been authorized for detection and/or diagnosis of SARS-CoV-2 by FDA under an Emergency Use Authorization (EUA).  This EUA will remain in effect (meaning this test can be used) for the duration of the COVID-19 declaration under Section 564(b)(1) of the Act, 21 U.S.C. section 360bbb-3(b)(1), unless the authorization is terminated or revoked sooner. Performed at [facility], [place] [address], [place], [place] [address]   Urine culture     Status: Abnormal   Collection Time: 07/03/19  1:30 PM   Specimen: Urine, Random  Result Value  Ref Range Status   Specimen Description URINE, RANDOM  Final   Special Requests   Final    NONE Performed at [facility], [address], [place], [place] [address]    Culture (A)  Final    >=100,000 COLONIES/mL ESCHERICHIA COLI 10,000 COLONIES/mL PROTEUS MIRABILIS    Report Status 07/08/2019 FINAL  Final   Organism ID, Bacteria ESCHERICHIA COLI (A)  Final   Organism ID, Bacteria  PROTEUS MIRABILIS (A)  Final      Susceptibility   Escherichia coli - MIC*    AMPICILLIN 8 SENSITIVE Sensitive     CEFAZOLIN <=4 SENSITIVE Sensitive     CEFTRIAXONE <=1 SENSITIVE Sensitive     CIPROFLOXACIN <=0.25 SENSITIVE Sensitive     GENTAMICIN <=1 SENSITIVE Sensitive     IMIPENEM <=0.25 SENSITIVE Sensitive     NITROFURANTOIN <=16 SENSITIVE Sensitive     TRIMETH/SULFA <=20 SENSITIVE Sensitive     AMPICILLIN/SULBACTAM 4 SENSITIVE Sensitive     PIP/TAZO <=4 SENSITIVE Sensitive     Extended ESBL NEGATIVE Sensitive     * >=100,000 COLONIES/mL ESCHERICHIA COLI   Proteus mirabilis - MIC*    AMPICILLIN <=2 SENSITIVE Sensitive     CEFAZOLIN <=4 SENSITIVE Sensitive     CEFTRIAXONE <=1 SENSITIVE Sensitive     CIPROFLOXACIN <=0.25 SENSITIVE Sensitive     GENTAMICIN <=1 SENSITIVE Sensitive     IMIPENEM 2 SENSITIVE Sensitive     NITROFURANTOIN 128 RESISTANT Resistant     TRIMETH/SULFA <=20 SENSITIVE Sensitive     AMPICILLIN/SULBACTAM <=2 SENSITIVE Sensitive     PIP/TAZO <=4 SENSITIVE Sensitive     * 10,000 COLONIES/mL PROTEUS MIRABILIS  Culture, blood (routine x 2)     Status: None   Collection Time: 07/04/19  9:25 AM   Specimen: BLOOD  Result Value Ref Range Status   Specimen Description BLOOD LEFT ANTECUBITAL  Final   Special Requests AEROBIC BOTTLE ONLY Blood Culture adequate volume  Final   Culture   Final    NO GROWTH 5 DAYS Performed at [facility], [place] [address], [place], [place] [address]    Report Status 07/09/2019 FINAL  Final  Culture, blood (routine x 2)     Status: None   Collection Time: 07/04/19  9:25 AM   Specimen: BLOOD  Result Value Ref Range Status   Specimen Description BLOOD LEFT ANTECUBITAL  Final   Special Requests AEROBIC BOTTLE ONLY Blood Culture adequate volume  Final   Culture   Final    NO GROWTH 5 DAYS Performed at [facility] [address], [place], [place] [address]    Report Status 07/09/2019 FINAL  Final  Culture, Urine      Status: Abnormal   Collection Time: 07/04/19 10:46 AM   Specimen: Urine, Random  Result Value Ref Range Status   Specimen Description URINE, RANDOM  Final   Special Requests NONE  Final   Culture (A)  Final    <10,000 COLONIES/mL INSIGNIFICANT GROWTH Performed at [facility] [address], [place], [place] [address]    Report Status 07/05/2019 FINAL  Final     Time coordinating discharge: Over 30 minutes  SIGNED:   Desiree Hane, MD  [facility] 07/09/2019, 2:18 PM Pager   If 7PM-7AM, please contact night-coverage [email or web address] Password [email or web address]

## 2019-07-09 NOTE — TOC Transition Note (Signed)
Transition of Care [facility]) - CM/SW Discharge Note   Patient Details  Name: Jared Hall MRN: [id number] Date of Birth: Jun 02, 1931  Transition of Care [facility]) CM/SW Contact:  Geralynn Ochs, LCSW Phone Number: 07/09/2019, 3:50 PM   Clinical Narrative:  Nurse to call report to [phone].     Final next level of care: Assisted Living Barriers to Discharge: Barriers Resolved   Patient Goals and CMS Choice Patient states their goals for this hospitalization and ongoing recovery are:: Pt son and daughter-in-law are agreeable to rehab CMS Medicare.gov Compare Post Acute Care list provided to:: Patient Represenative (must comment) Choice offered to / list presented to : Adult Children  Discharge Placement              Patient chooses bed at: [facility] Patient to be transferred to facility by: [facility] Name of family member notified: [name] Patient and family notified of of transfer: 07/09/19  Discharge Plan and Services In-house Referral: Clinical Social Work Discharge Planning Services: NA Post Acute Care Choice: [place]          DME Arranged: N/A DME Agency: NA       HH Arranged: NA HH Agency: NA        Social Determinants of Health (SDOH) Interventions     Readmission Risk Interventions Readmission Risk Prevention Plan 06/09/2019  Transportation Screening Complete  PCP or Specialist Appt within 3-5 Days Not Complete  Not Complete comments not ready for d/c, plan to go to SNF  [place] or [place] Complete  Social Work Consult for [place] Planning/Counseling Complete  Palliative Care Screening Not Applicable  Medication Review Press photographer) Complete  Some recent data might be hidden

## 2019-07-09 NOTE — NC FL2 (Signed)
[place] MEDICAID FL2 LEVEL OF CARE SCREENING TOOL     IDENTIFICATION  Patient Name: Jared Hall Birthdate: May 27, 1931 Sex: male Admission Date (Current Location): 07/03/2019  [facility] and [place] Number:  Herbalist and Address:  [facility], [place] [address], [place], [place] [address]      Provider Number: [id number]  Attending Physician Name and Address:  Desiree Hane, MD  Relative Name and Phone Number:  Kevron Patella, [name] Leiter and Daughter-in-law, [phone]    Current Level of Care: Hospital Recommended Level of Care: [place] Prior Approval Number:    Date Approved/Denied:   PASRR Number: [id number]  Discharge Plan: Other (Comment)(ALF)    Current Diagnoses: Patient Active Problem List   Diagnosis Date Noted  . E-coli UTI 07/09/2019  . Proteus infection 07/09/2019  . Acute metabolic encephalopathy 52/77/8242  . Palliative care by specialist   . DNR (do not resuscitate)   . Leukocytosis 07/03/2019  . Acute renal failure superimposed on chronic kidney disease ([place]) 07/03/2019  . Chest pain 07/03/2019  . Urinary tract infection 07/03/2019  . Hypokalemia 06/02/2019  . TIA (transient ischemic attack) 05/22/2018  . Cellulitis in diabetic foot ([place]) 05/22/2018  . Tremor 10/17/2017  . Acute encephalopathy   . Acute ischemic stroke ([place])   . Weakness generalized   . Cerebrovascular disease   . Altered mental status   . Acute respiratory failure with hypercapnia ([place])   . CVA (cerebral vascular accident) ([place]) 01/01/2017  . Dehydration 01/01/2017  . Anemia 01/01/2017  . Acute respiratory failure ([place]) 12/15/2016  . Morbid obesity ([place]) 12/15/2016  . Hard of hearing 12/15/2016  . Chronic kidney disease, stage III (moderate) ([place]) 12/06/2016  . Bell's palsy   . Facial droop   . Paroxysmal atrial fibrillation (HCC)   . Embolic stroke ([place])   . Dysphagia 03/14/2015  . Dysarthria 03/14/2015  . Stroke  ([place]) 03/14/2015  . Cellulitis 01/11/2014  . Bilateral lower extremity edema 01/11/2014  . Venous stasis dermatitis 01/11/2014  . DM type 2 with diabetic peripheral neuropathy ([place]) 01/11/2014  . Cellulitis and abscess of leg 01/11/2014  . Cellulitis and abscess 01/11/2014  . Depression 06/15/2013  . GERD (gastroesophageal reflux disease) 06/15/2013  . Constipation 06/15/2013  . Diabetic neuropathy ([place]) 06/15/2013  . Type 1 diabetes mellitus with peripheral circulatory complications ([place]) 35/36/1443  . Essential hypertension, benign 04/07/2013  . AKI (acute kidney injury) ([place]) 04/04/2013  . Bilateral lower leg cellulitis 04/04/2013  . Right hip pain 04/04/2013  . Renal insufficiency 12/16/2011  . History of CVA (cerebrovascular accident) 12/16/2011  . Dyslipidemia 12/16/2011  . Obesity (BMI 30-39.9) 12/16/2011  . Umbilical hernia with obstruction-partial on CT scan; easily reducible 12/14/2011  . History of PSVT (paroxysmal supraventricular tachycardia) 12/14/2011  . DM (diabetes mellitus) ([place]) 12/14/2011    Orientation RESPIRATION BLADDER Height & Weight     Self(Disorientated x4)  O2([place] 2L) Incontinent Weight: 246 lb 14.6 oz (112 kg) Height:     BEHAVIORAL SYMPTOMS/MOOD NEUROLOGICAL BOWEL NUTRITION STATUS      Incontinent Diet(puree solids, nectar thick liquids)  AMBULATORY STATUS COMMUNICATION OF NEEDS Skin   Extensive Assist Verbally Other (Comment)(Cellulitis on leg, MASD on abdomen)                       Personal Care Assistance Level of Assistance  Bathing, Feeding, Dressing, Total care Bathing Assistance: Maximum assistance Feeding assistance: Limited assistance Dressing Assistance: Maximum  assistance Total Care Assistance: Maximum assistance   Functional Limitations Info  Sight, Hearing, Speech Sight Info: Adequate Hearing Info: Impaired(hard of hearing) Speech Info: Impaired(dysarthria)    SPECIAL CARE FACTORS FREQUENCY  PT (By licensed PT), OT (By  licensed OT)     PT Frequency: 5x/wk OT Frequency: 5x/wk            Contractures Contractures Info: Not present    Additional Factors Info  Code Status, Allergies Code Status Info: DNR Allergies Info: Ativan (Lorazepam), Flomax (Tamsulosin Hcl), Glucophage (Metformin Hcl)           Current Medications (07/09/2019):  This is the current hospital active medication list Current Facility-Administered Medications  Medication Dose Route Frequency Provider Last Rate Last Dose  . 0.9 %  sodium chloride infusion   Intravenous PRN Fuller Plan A, MD 10 mL/hr at 07/04/19 0500    . acetaminophen (TYLENOL) tablet 650 mg  650 mg Oral Q6H PRN Fuller Plan A, MD       Or  . acetaminophen (TYLENOL) suppository 650 mg  650 mg Rectal Q6H PRN Fuller Plan A, MD      . amitriptyline (ELAVIL) tablet 10 mg  10 mg Oral QHS Oretha Milch D, MD      . antiseptic oral rinse (BIOTENE) solution 15 mL  15 mL Mouth Rinse TID Raiford Noble Latif, DO   15 mL at 07/08/19 2112  . [START ON 07/10/2019] cephALEXin (KEFLEX) capsule 500 mg  500 mg Oral Q12H Nettey, Myrlene Broker D, MD      . clopidogrel (PLAVIX) tablet 75 mg  75 mg Oral Daily Oretha Milch D, MD      . feeding supplement (ENSURE ENLIVE) (ENSURE ENLIVE) liquid 237 mL  237 mL Oral TID BM Sheikh, Omair Latif, DO   237 mL at 07/09/19 1107  . Gerhardt's butt cream   Topical TID Raiford Noble Latif, DO      . heparin injection 5,000 Units  5,000 Units Subcutaneous Q8H Norval Morton, MD   5,000 Units at 07/09/19 [phone]  . ondansetron (ZOFRAN) tablet 4 mg  4 mg Oral Q6H PRN Norval [place], MD       Or  . ondansetron (ZOFRAN) injection 4 mg  4 mg Intravenous Q6H PRN Norval Morton, MD      . Resource ThickenUp Clear   Oral PRN Sheikh, Omair Latif, DO      . senna-docusate (Senokot-S) tablet 1 tablet  1 tablet Oral BID Oretha Milch D, MD      . simvastatin (ZOCOR) tablet 20 mg  20 mg Oral Q2000 Oretha Milch D, MD      . sodium chloride flush (NS)  0.9 % injection 3 mL  3 mL Intravenous Q12H Smith, Rondell A, MD   3 mL at 07/09/19 1109     Discharge Medications: STOP taking these medications   furosemide 80 MG tablet Commonly known as: LASIX   metolazone 5 MG tablet Commonly known as: ZAROXOLYN   metoprolol tartrate 25 MG tablet Commonly known as: LOPRESSOR   potassium chloride SA 20 MEQ tablet Commonly known as: K-DUR     TAKE these medications   acetaminophen 500 MG tablet Commonly known as: TYLENOL Take 500 mg by mouth every 6 (six) hours as needed for fever.   amitriptyline 10 MG tablet Commonly known as: ELAVIL Take 10 mg by mouth at bedtime.   aspirin EC 81 MG tablet Take 81 mg by mouth daily.   azelastine 0.05 %  ophthalmic solution Commonly known as: OPTIVAR Place 1 drop into both eyes 2 (two) times daily as needed (dryness or itching).   Cepacol 15-2.3 MG Lozg Generic drug: Benzocaine-Menthol Use as directed 1 lozenge in the mouth or throat 4 (four) times daily as needed (for soreness).   cephALEXin 500 MG capsule Commonly known as: KEFLEX Take 1 capsule (500 mg total) by mouth every 12 (twelve) hours for 1 day. Start taking on: July 10, 2019   clopidogrel 75 MG tablet Commonly known as: PLAVIX Take 75 mg by mouth daily.   insulin glargine 100 UNIT/ML injection Commonly known as: LANTUS Inject 0.1 mLs (10 Units total) into the skin daily.   NovoLOG FlexPen 100 UNIT/ML FlexPen Generic drug: insulin aspart Inject 5 Units into the skin See admin instructions. Inject 5 units into the skin three times a day with meals and "check sugar two times a day and use sliding scale coverage as follows: BGL 150 = give nothing; 151-200 = 7 units; 201-250 = 9 units; 251-300 = 11 units; 301-350 = 13 units; 351-400 = 15 units"   pantoprazole 40 MG tablet Commonly known as: PROTONIX Take 40 mg by mouth daily.   senna-docusate 8.6-50 MG tablet Commonly known as: Senokot-S Take 1 tablet by mouth 2  (two) times daily.   simvastatin 20 MG tablet Commonly known as: ZOCOR Take 20 mg by mouth daily at 8 pm.   Systane 0.4-0.3 % Gel ophthalmic gel Generic drug: Polyethyl Glycol-Propyl Glycol Place 1 application into the right eye See admin instructions. Pull down the lower right eyelid and apply a small amount three times a day as needed for dryness   traMADol 50 MG tablet Commonly known as: ULTRAM Take 1 tablet (50 mg total) by mouth every 6 (six) hours as needed for moderate pain. What changed: reasons to take this   vitamin B-12 500 MCG tablet Commonly known as: CYANOCOBALAMIN Take 500 mcg by mouth daily.   Vitamin D3 50 MCG (2000 UT) capsule Take 2,000 Units by mouth daily.     Relevant Imaging Results:  Relevant Lab Results:   Additional Information SSN: [id number]  Geralynn Ochs, Hall

## 2019-07-15 DIAGNOSIS — E876 Hypokalemia: Secondary | ICD-10-CM | POA: Diagnosis not present

## 2019-07-15 DIAGNOSIS — I872 Venous insufficiency (chronic) (peripheral): Secondary | ICD-10-CM | POA: Diagnosis not present

## 2019-07-15 DIAGNOSIS — R600 Localized edema: Secondary | ICD-10-CM | POA: Diagnosis not present

## 2019-07-15 DIAGNOSIS — I5030 Unspecified diastolic (congestive) heart failure: Secondary | ICD-10-CM | POA: Diagnosis not present

## 2019-07-15 DIAGNOSIS — E1122 Type 2 diabetes mellitus with diabetic chronic kidney disease: Secondary | ICD-10-CM | POA: Diagnosis not present

## 2019-07-15 DIAGNOSIS — G4733 Obstructive sleep apnea (adult) (pediatric): Secondary | ICD-10-CM | POA: Diagnosis not present

## 2019-07-17 DIAGNOSIS — N390 Urinary tract infection, site not specified: Secondary | ICD-10-CM | POA: Diagnosis not present

## 2019-07-17 DIAGNOSIS — M6281 Muscle weakness (generalized): Secondary | ICD-10-CM | POA: Diagnosis not present

## 2019-07-17 DIAGNOSIS — G9341 Metabolic encephalopathy: Secondary | ICD-10-CM | POA: Diagnosis not present

## 2019-07-17 DIAGNOSIS — R2681 Unsteadiness on feet: Secondary | ICD-10-CM | POA: Diagnosis not present

## 2019-07-18 ENCOUNTER — Telehealth: Payer: Self-pay | Admitting: Licensed Clinical Social Worker

## 2019-07-18 NOTE — Telephone Encounter (Signed)
Palliative Care SW left a vm with patient's son, [name], to schedule a visit.

## 2019-07-21 DIAGNOSIS — R2681 Unsteadiness on feet: Secondary | ICD-10-CM | POA: Diagnosis not present

## 2019-07-21 DIAGNOSIS — M6281 Muscle weakness (generalized): Secondary | ICD-10-CM | POA: Diagnosis not present

## 2019-07-21 DIAGNOSIS — N390 Urinary tract infection, site not specified: Secondary | ICD-10-CM | POA: Diagnosis not present

## 2019-07-21 DIAGNOSIS — G9341 Metabolic encephalopathy: Secondary | ICD-10-CM | POA: Diagnosis not present

## 2019-07-23 DIAGNOSIS — R2681 Unsteadiness on feet: Secondary | ICD-10-CM | POA: Diagnosis not present

## 2019-07-23 DIAGNOSIS — M6281 Muscle weakness (generalized): Secondary | ICD-10-CM | POA: Diagnosis not present

## 2019-07-23 DIAGNOSIS — G9341 Metabolic encephalopathy: Secondary | ICD-10-CM | POA: Diagnosis not present

## 2019-07-23 DIAGNOSIS — N390 Urinary tract infection, site not specified: Secondary | ICD-10-CM | POA: Diagnosis not present

## 2019-07-24 ENCOUNTER — Telehealth: Payer: Self-pay | Admitting: *Deleted

## 2019-07-24 NOTE — Telephone Encounter (Signed)
[facility] facility and spoke with Jared Hall, (facility coordinator), to arrange an in person visit. Visit is scheduled for Tuesday 07/29/19 at 10:30 am.

## 2019-07-25 DIAGNOSIS — M6281 Muscle weakness (generalized): Secondary | ICD-10-CM | POA: Diagnosis not present

## 2019-07-25 DIAGNOSIS — G9341 Metabolic encephalopathy: Secondary | ICD-10-CM | POA: Diagnosis not present

## 2019-07-25 DIAGNOSIS — N390 Urinary tract infection, site not specified: Secondary | ICD-10-CM | POA: Diagnosis not present

## 2019-07-25 DIAGNOSIS — R2681 Unsteadiness on feet: Secondary | ICD-10-CM | POA: Diagnosis not present

## 2019-07-29 ENCOUNTER — Non-Acute Institutional Stay: Payer: Self-pay | Admitting: *Deleted

## 2019-07-29 ENCOUNTER — Telehealth: Payer: Self-pay | Admitting: *Deleted

## 2019-07-29 ENCOUNTER — Other Ambulatory Visit: Payer: Self-pay

## 2019-07-29 VITALS — BP 143/69 | HR 92 | Temp 97.8°F | Resp 18

## 2019-07-29 DIAGNOSIS — M6281 Muscle weakness (generalized): Secondary | ICD-10-CM | POA: Diagnosis not present

## 2019-07-29 DIAGNOSIS — G9341 Metabolic encephalopathy: Secondary | ICD-10-CM | POA: Diagnosis not present

## 2019-07-29 DIAGNOSIS — N390 Urinary tract infection, site not specified: Secondary | ICD-10-CM | POA: Diagnosis not present

## 2019-07-29 DIAGNOSIS — Z515 Encounter for palliative care: Secondary | ICD-10-CM

## 2019-07-29 DIAGNOSIS — R2681 Unsteadiness on feet: Secondary | ICD-10-CM | POA: Diagnosis not present

## 2019-07-29 NOTE — Telephone Encounter (Signed)
Initially contacted and left a voicemail for Dr. Inda Merlin nurse requesting an order for a hospice consult for this patient. I received a return call from Dr. Inda Merlin nurse who states that he gave the verbal orders for a hospice consult and he agrees to be the Attending of Record for this patient while under hospice care. Nurse will also send a fax to [facility] (patient's residence) for a hospice consult to place on his chart for their records.

## 2019-07-30 DIAGNOSIS — G9341 Metabolic encephalopathy: Secondary | ICD-10-CM | POA: Diagnosis not present

## 2019-07-30 DIAGNOSIS — R2681 Unsteadiness on feet: Secondary | ICD-10-CM | POA: Diagnosis not present

## 2019-07-30 DIAGNOSIS — M6281 Muscle weakness (generalized): Secondary | ICD-10-CM | POA: Diagnosis not present

## 2019-07-30 DIAGNOSIS — N390 Urinary tract infection, site not specified: Secondary | ICD-10-CM | POA: Diagnosis not present

## 2019-07-30 NOTE — Progress Notes (Signed)
COMMUNITY PALLIATIVE CARE RN NOTE  PATIENT NAME: Jared Hall DOB: November 29, 1930 MRN: [id number]  PRIMARY CARE PROVIDER: Josetta Huddle, MD  RESPONSIBLE PARTY: Jared Hall (wife) Acct ID - Guarantor Home Phone Work Phone Relationship Acct Type  [id number] Jared Hall [phone]  Self P/F     [address] [place], [place] [address]   Covid-19 Pre-screening Negative  PLAN OF CARE and INTERVENTION 1. ADVANCE CARE PLANNING/GOALS OF CARE: Goal is to remain at current AL facility and avoid hospitalizations. He has a DNR on his facility chart. 2. PATIENT/CAREGIVER EDUCATION: Explained Palliative Care Services 3. DISEASE STATUS: Met with patient in small dining hall in his facility. Upon arrival, staff reports that he was unable to transfer from his recliner to his wheelchair without assistance. He was c/o bilateral leg pain and PRN Tramadol was given 45 minutes prior to my visit. Patient continues to c/o leg pain. Legs and feet are very swollen, tight and shiny in appearance. He reports having some mild issues with breathing. He does have oxygen at 2L/min via [place], but did not have it on during visit. Patient able to answer some simple questions, but at other times answers to questions did not match what was being asked. He states that he is tired/sleepy and just wants to "sleep for the rest of the day." He used to be able to transfer independently consistently, but has recently started requiring more assistance. He requires 1 person assistance with bathing, dressing and toileting. He is much weaker and has had several falls.  He is now incontinent of bladder and wears Depends. His intake is poor, eating less than 25% of meals. He was recently hospitalized for 6 days in August d/t sepsis from UTI. During his hospitalization he was given a Speech therapy evaluation and his diet was downgraded to a Pureed diet. Patient states he feels he would eat better on his previous Diabetic diet. His medications are now  given crushed in applesauce. Facility feels that patient would benefit from hospice services. Spoke with hospice medical director, Jared Hall who agrees with hospice eligibility. Contacted his PCP, Jared Hall who gave verbal order for hospice consult and his nurse faxed an order over to the facility for their records. Referral information given to [facility] referral center today. I contacted facility coordinator and made her aware of above noted information. Will continue to monitor.   HISTORY OF PRESENT ILLNESS:  This is a 85 yo male who resides at [facility]. Palliative Care team asked to follow patient for additional support. Patient now referred to hospice.  CODE STATUS: DNR  ADVANCED DIRECTIVES: Y (Living Will) MOST FORM: no PPS: weak  40%   PHYSICAL EXAM:   VITALS: Today's Vitals   07/29/19 1047  BP: (!) 143/69  Pulse: 92  Resp: 18  Temp: 97.8 F (36.6 C)  TempSrc: Temporal  SpO2: 98%  PainSc: 4   PainLoc: Leg    LUNGS: decreased breath sounds CARDIAC: Cor irreg, irreg RRR EXTREMITIES: Bilateral legs/feet very swollen, tight and shiny in appearance SKIN: Exposed skin is dry and intact: Multiple scabbed areas noted to bilateral arms  NEURO: Alert and oriented to self, intermittent confusion, increased generalized weakness, non-ambulatory   (Duration of visit and documentation 90 minutes)    Jared Eastern, RN BSN

## 2019-07-31 DIAGNOSIS — G4733 Obstructive sleep apnea (adult) (pediatric): Secondary | ICD-10-CM | POA: Diagnosis not present

## 2019-07-31 DIAGNOSIS — I5042 Chronic combined systolic (congestive) and diastolic (congestive) heart failure: Secondary | ICD-10-CM | POA: Diagnosis not present

## 2019-07-31 DIAGNOSIS — E1122 Type 2 diabetes mellitus with diabetic chronic kidney disease: Secondary | ICD-10-CM | POA: Diagnosis not present

## 2019-07-31 DIAGNOSIS — E785 Hyperlipidemia, unspecified: Secondary | ICD-10-CM | POA: Diagnosis not present

## 2019-07-31 DIAGNOSIS — K219 Gastro-esophageal reflux disease without esophagitis: Secondary | ICD-10-CM | POA: Diagnosis not present

## 2019-07-31 DIAGNOSIS — I509 Heart failure, unspecified: Secondary | ICD-10-CM | POA: Diagnosis not present

## 2019-07-31 DIAGNOSIS — I69391 Dysphagia following cerebral infarction: Secondary | ICD-10-CM | POA: Diagnosis not present

## 2019-07-31 DIAGNOSIS — Z993 Dependence on wheelchair: Secondary | ICD-10-CM | POA: Diagnosis not present

## 2019-07-31 DIAGNOSIS — D560 Alpha thalassemia: Secondary | ICD-10-CM | POA: Diagnosis not present

## 2019-07-31 DIAGNOSIS — H9190 Unspecified hearing loss, unspecified ear: Secondary | ICD-10-CM | POA: Diagnosis not present

## 2019-07-31 DIAGNOSIS — F329 Major depressive disorder, single episode, unspecified: Secondary | ICD-10-CM | POA: Diagnosis not present

## 2019-07-31 DIAGNOSIS — R634 Abnormal weight loss: Secondary | ICD-10-CM | POA: Diagnosis not present

## 2019-07-31 DIAGNOSIS — F0390 Unspecified dementia without behavioral disturbance: Secondary | ICD-10-CM | POA: Diagnosis not present

## 2019-07-31 DIAGNOSIS — Z8546 Personal history of malignant neoplasm of prostate: Secondary | ICD-10-CM | POA: Diagnosis not present

## 2019-07-31 DIAGNOSIS — R159 Full incontinence of feces: Secondary | ICD-10-CM | POA: Diagnosis not present

## 2019-07-31 DIAGNOSIS — Z741 Need for assistance with personal care: Secondary | ICD-10-CM | POA: Diagnosis not present

## 2019-07-31 DIAGNOSIS — I69318 Other symptoms and signs involving cognitive functions following cerebral infarction: Secondary | ICD-10-CM | POA: Diagnosis not present

## 2019-07-31 DIAGNOSIS — N400 Enlarged prostate without lower urinary tract symptoms: Secondary | ICD-10-CM | POA: Diagnosis not present

## 2019-07-31 DIAGNOSIS — K311 Adult hypertrophic pyloric stenosis: Secondary | ICD-10-CM | POA: Diagnosis not present

## 2019-07-31 DIAGNOSIS — I4891 Unspecified atrial fibrillation: Secondary | ICD-10-CM | POA: Diagnosis not present

## 2019-07-31 DIAGNOSIS — N183 Chronic kidney disease, stage 3 (moderate): Secondary | ICD-10-CM | POA: Diagnosis not present

## 2019-07-31 DIAGNOSIS — R32 Unspecified urinary incontinence: Secondary | ICD-10-CM | POA: Diagnosis not present

## 2019-07-31 DIAGNOSIS — D539 Nutritional anemia, unspecified: Secondary | ICD-10-CM | POA: Diagnosis not present

## 2019-08-01 DIAGNOSIS — R2681 Unsteadiness on feet: Secondary | ICD-10-CM | POA: Diagnosis not present

## 2019-08-01 DIAGNOSIS — G9341 Metabolic encephalopathy: Secondary | ICD-10-CM | POA: Diagnosis not present

## 2019-08-01 DIAGNOSIS — M6281 Muscle weakness (generalized): Secondary | ICD-10-CM | POA: Diagnosis not present

## 2019-08-01 DIAGNOSIS — N390 Urinary tract infection, site not specified: Secondary | ICD-10-CM | POA: Diagnosis not present

## 2019-08-04 DIAGNOSIS — R2681 Unsteadiness on feet: Secondary | ICD-10-CM | POA: Diagnosis not present

## 2019-08-04 DIAGNOSIS — M6281 Muscle weakness (generalized): Secondary | ICD-10-CM | POA: Diagnosis not present

## 2019-08-04 DIAGNOSIS — G9341 Metabolic encephalopathy: Secondary | ICD-10-CM | POA: Diagnosis not present

## 2019-08-04 DIAGNOSIS — N390 Urinary tract infection, site not specified: Secondary | ICD-10-CM | POA: Diagnosis not present

## 2019-08-05 DIAGNOSIS — R2681 Unsteadiness on feet: Secondary | ICD-10-CM | POA: Diagnosis not present

## 2019-08-05 DIAGNOSIS — G9341 Metabolic encephalopathy: Secondary | ICD-10-CM | POA: Diagnosis not present

## 2019-08-05 DIAGNOSIS — N390 Urinary tract infection, site not specified: Secondary | ICD-10-CM | POA: Diagnosis not present

## 2019-08-05 DIAGNOSIS — M6281 Muscle weakness (generalized): Secondary | ICD-10-CM | POA: Diagnosis not present

## 2019-08-06 DIAGNOSIS — I69391 Dysphagia following cerebral infarction: Secondary | ICD-10-CM | POA: Diagnosis not present

## 2019-08-06 DIAGNOSIS — I4891 Unspecified atrial fibrillation: Secondary | ICD-10-CM | POA: Diagnosis not present

## 2019-08-06 DIAGNOSIS — I69318 Other symptoms and signs involving cognitive functions following cerebral infarction: Secondary | ICD-10-CM | POA: Diagnosis not present

## 2019-08-06 DIAGNOSIS — I509 Heart failure, unspecified: Secondary | ICD-10-CM | POA: Diagnosis not present

## 2019-08-06 DIAGNOSIS — R634 Abnormal weight loss: Secondary | ICD-10-CM | POA: Diagnosis not present

## 2019-08-06 DIAGNOSIS — F0390 Unspecified dementia without behavioral disturbance: Secondary | ICD-10-CM | POA: Diagnosis not present

## 2019-08-07 DIAGNOSIS — I509 Heart failure, unspecified: Secondary | ICD-10-CM | POA: Diagnosis not present

## 2019-08-07 DIAGNOSIS — I69318 Other symptoms and signs involving cognitive functions following cerebral infarction: Secondary | ICD-10-CM | POA: Diagnosis not present

## 2019-08-07 DIAGNOSIS — R634 Abnormal weight loss: Secondary | ICD-10-CM | POA: Diagnosis not present

## 2019-08-07 DIAGNOSIS — F0390 Unspecified dementia without behavioral disturbance: Secondary | ICD-10-CM | POA: Diagnosis not present

## 2019-08-07 DIAGNOSIS — I4891 Unspecified atrial fibrillation: Secondary | ICD-10-CM | POA: Diagnosis not present

## 2019-08-07 DIAGNOSIS — I69391 Dysphagia following cerebral infarction: Secondary | ICD-10-CM | POA: Diagnosis not present

## 2019-08-08 DIAGNOSIS — I509 Heart failure, unspecified: Secondary | ICD-10-CM | POA: Diagnosis not present

## 2019-08-08 DIAGNOSIS — F0390 Unspecified dementia without behavioral disturbance: Secondary | ICD-10-CM | POA: Diagnosis not present

## 2019-08-08 DIAGNOSIS — I4891 Unspecified atrial fibrillation: Secondary | ICD-10-CM | POA: Diagnosis not present

## 2019-08-08 DIAGNOSIS — I69318 Other symptoms and signs involving cognitive functions following cerebral infarction: Secondary | ICD-10-CM | POA: Diagnosis not present

## 2019-08-08 DIAGNOSIS — R634 Abnormal weight loss: Secondary | ICD-10-CM | POA: Diagnosis not present

## 2019-08-08 DIAGNOSIS — I69391 Dysphagia following cerebral infarction: Secondary | ICD-10-CM | POA: Diagnosis not present

## 2019-08-10 DIAGNOSIS — I509 Heart failure, unspecified: Secondary | ICD-10-CM | POA: Diagnosis not present

## 2019-08-10 DIAGNOSIS — R634 Abnormal weight loss: Secondary | ICD-10-CM | POA: Diagnosis not present

## 2019-08-10 DIAGNOSIS — I4891 Unspecified atrial fibrillation: Secondary | ICD-10-CM | POA: Diagnosis not present

## 2019-08-10 DIAGNOSIS — I69391 Dysphagia following cerebral infarction: Secondary | ICD-10-CM | POA: Diagnosis not present

## 2019-08-10 DIAGNOSIS — I69318 Other symptoms and signs involving cognitive functions following cerebral infarction: Secondary | ICD-10-CM | POA: Diagnosis not present

## 2019-08-10 DIAGNOSIS — F0390 Unspecified dementia without behavioral disturbance: Secondary | ICD-10-CM | POA: Diagnosis not present

## 2019-08-11 DIAGNOSIS — I69318 Other symptoms and signs involving cognitive functions following cerebral infarction: Secondary | ICD-10-CM | POA: Diagnosis not present

## 2019-08-11 DIAGNOSIS — I4891 Unspecified atrial fibrillation: Secondary | ICD-10-CM | POA: Diagnosis not present

## 2019-08-11 DIAGNOSIS — F0390 Unspecified dementia without behavioral disturbance: Secondary | ICD-10-CM | POA: Diagnosis not present

## 2019-08-11 DIAGNOSIS — I509 Heart failure, unspecified: Secondary | ICD-10-CM | POA: Diagnosis not present

## 2019-08-11 DIAGNOSIS — I69391 Dysphagia following cerebral infarction: Secondary | ICD-10-CM | POA: Diagnosis not present

## 2019-08-11 DIAGNOSIS — R634 Abnormal weight loss: Secondary | ICD-10-CM | POA: Diagnosis not present

## 2019-08-13 DIAGNOSIS — I4891 Unspecified atrial fibrillation: Secondary | ICD-10-CM | POA: Diagnosis not present

## 2019-08-13 DIAGNOSIS — I69318 Other symptoms and signs involving cognitive functions following cerebral infarction: Secondary | ICD-10-CM | POA: Diagnosis not present

## 2019-08-13 DIAGNOSIS — R634 Abnormal weight loss: Secondary | ICD-10-CM | POA: Diagnosis not present

## 2019-08-13 DIAGNOSIS — I509 Heart failure, unspecified: Secondary | ICD-10-CM | POA: Diagnosis not present

## 2019-08-13 DIAGNOSIS — F0390 Unspecified dementia without behavioral disturbance: Secondary | ICD-10-CM | POA: Diagnosis not present

## 2019-08-13 DIAGNOSIS — I69391 Dysphagia following cerebral infarction: Secondary | ICD-10-CM | POA: Diagnosis not present

## 2019-08-15 DIAGNOSIS — I4891 Unspecified atrial fibrillation: Secondary | ICD-10-CM | POA: Diagnosis not present

## 2019-08-15 DIAGNOSIS — R634 Abnormal weight loss: Secondary | ICD-10-CM | POA: Diagnosis not present

## 2019-08-15 DIAGNOSIS — I69391 Dysphagia following cerebral infarction: Secondary | ICD-10-CM | POA: Diagnosis not present

## 2019-08-15 DIAGNOSIS — I509 Heart failure, unspecified: Secondary | ICD-10-CM | POA: Diagnosis not present

## 2019-08-15 DIAGNOSIS — F0390 Unspecified dementia without behavioral disturbance: Secondary | ICD-10-CM | POA: Diagnosis not present

## 2019-08-15 DIAGNOSIS — I69318 Other symptoms and signs involving cognitive functions following cerebral infarction: Secondary | ICD-10-CM | POA: Diagnosis not present

## 2019-08-18 DIAGNOSIS — F0390 Unspecified dementia without behavioral disturbance: Secondary | ICD-10-CM | POA: Diagnosis not present

## 2019-08-18 DIAGNOSIS — R634 Abnormal weight loss: Secondary | ICD-10-CM | POA: Diagnosis not present

## 2019-08-18 DIAGNOSIS — I69318 Other symptoms and signs involving cognitive functions following cerebral infarction: Secondary | ICD-10-CM | POA: Diagnosis not present

## 2019-08-18 DIAGNOSIS — I509 Heart failure, unspecified: Secondary | ICD-10-CM | POA: Diagnosis not present

## 2019-08-18 DIAGNOSIS — I69391 Dysphagia following cerebral infarction: Secondary | ICD-10-CM | POA: Diagnosis not present

## 2019-08-18 DIAGNOSIS — I4891 Unspecified atrial fibrillation: Secondary | ICD-10-CM | POA: Diagnosis not present

## 2019-08-20 DIAGNOSIS — I509 Heart failure, unspecified: Secondary | ICD-10-CM | POA: Diagnosis not present

## 2019-08-20 DIAGNOSIS — I69318 Other symptoms and signs involving cognitive functions following cerebral infarction: Secondary | ICD-10-CM | POA: Diagnosis not present

## 2019-08-20 DIAGNOSIS — F0390 Unspecified dementia without behavioral disturbance: Secondary | ICD-10-CM | POA: Diagnosis not present

## 2019-08-20 DIAGNOSIS — I69391 Dysphagia following cerebral infarction: Secondary | ICD-10-CM | POA: Diagnosis not present

## 2019-08-20 DIAGNOSIS — R634 Abnormal weight loss: Secondary | ICD-10-CM | POA: Diagnosis not present

## 2019-08-20 DIAGNOSIS — I4891 Unspecified atrial fibrillation: Secondary | ICD-10-CM | POA: Diagnosis not present

## 2019-08-21 DIAGNOSIS — N183 Chronic kidney disease, stage 3 unspecified: Secondary | ICD-10-CM | POA: Diagnosis not present

## 2019-08-21 DIAGNOSIS — D539 Nutritional anemia, unspecified: Secondary | ICD-10-CM | POA: Diagnosis not present

## 2019-08-21 DIAGNOSIS — F0390 Unspecified dementia without behavioral disturbance: Secondary | ICD-10-CM | POA: Diagnosis not present

## 2019-08-21 DIAGNOSIS — D560 Alpha thalassemia: Secondary | ICD-10-CM | POA: Diagnosis not present

## 2019-08-21 DIAGNOSIS — Z993 Dependence on wheelchair: Secondary | ICD-10-CM | POA: Diagnosis not present

## 2019-08-21 DIAGNOSIS — F329 Major depressive disorder, single episode, unspecified: Secondary | ICD-10-CM | POA: Diagnosis not present

## 2019-08-21 DIAGNOSIS — Z8546 Personal history of malignant neoplasm of prostate: Secondary | ICD-10-CM | POA: Diagnosis not present

## 2019-08-21 DIAGNOSIS — H9190 Unspecified hearing loss, unspecified ear: Secondary | ICD-10-CM | POA: Diagnosis not present

## 2019-08-21 DIAGNOSIS — R634 Abnormal weight loss: Secondary | ICD-10-CM | POA: Diagnosis not present

## 2019-08-21 DIAGNOSIS — I509 Heart failure, unspecified: Secondary | ICD-10-CM | POA: Diagnosis not present

## 2019-08-21 DIAGNOSIS — I69318 Other symptoms and signs involving cognitive functions following cerebral infarction: Secondary | ICD-10-CM | POA: Diagnosis not present

## 2019-08-21 DIAGNOSIS — G4733 Obstructive sleep apnea (adult) (pediatric): Secondary | ICD-10-CM | POA: Diagnosis not present

## 2019-08-21 DIAGNOSIS — K219 Gastro-esophageal reflux disease without esophagitis: Secondary | ICD-10-CM | POA: Diagnosis not present

## 2019-08-21 DIAGNOSIS — E785 Hyperlipidemia, unspecified: Secondary | ICD-10-CM | POA: Diagnosis not present

## 2019-08-21 DIAGNOSIS — I69391 Dysphagia following cerebral infarction: Secondary | ICD-10-CM | POA: Diagnosis not present

## 2019-08-21 DIAGNOSIS — R32 Unspecified urinary incontinence: Secondary | ICD-10-CM | POA: Diagnosis not present

## 2019-08-21 DIAGNOSIS — N400 Enlarged prostate without lower urinary tract symptoms: Secondary | ICD-10-CM | POA: Diagnosis not present

## 2019-08-21 DIAGNOSIS — I4891 Unspecified atrial fibrillation: Secondary | ICD-10-CM | POA: Diagnosis not present

## 2019-08-21 DIAGNOSIS — R159 Full incontinence of feces: Secondary | ICD-10-CM | POA: Diagnosis not present

## 2019-08-21 DIAGNOSIS — E1122 Type 2 diabetes mellitus with diabetic chronic kidney disease: Secondary | ICD-10-CM | POA: Diagnosis not present

## 2019-08-21 DIAGNOSIS — K311 Adult hypertrophic pyloric stenosis: Secondary | ICD-10-CM | POA: Diagnosis not present

## 2019-08-21 DIAGNOSIS — Z741 Need for assistance with personal care: Secondary | ICD-10-CM | POA: Diagnosis not present

## 2019-08-22 DIAGNOSIS — R634 Abnormal weight loss: Secondary | ICD-10-CM | POA: Diagnosis not present

## 2019-08-22 DIAGNOSIS — I69318 Other symptoms and signs involving cognitive functions following cerebral infarction: Secondary | ICD-10-CM | POA: Diagnosis not present

## 2019-08-22 DIAGNOSIS — I509 Heart failure, unspecified: Secondary | ICD-10-CM | POA: Diagnosis not present

## 2019-08-22 DIAGNOSIS — F0390 Unspecified dementia without behavioral disturbance: Secondary | ICD-10-CM | POA: Diagnosis not present

## 2019-08-22 DIAGNOSIS — I4891 Unspecified atrial fibrillation: Secondary | ICD-10-CM | POA: Diagnosis not present

## 2019-08-22 DIAGNOSIS — I69391 Dysphagia following cerebral infarction: Secondary | ICD-10-CM | POA: Diagnosis not present

## 2019-08-25 DIAGNOSIS — I509 Heart failure, unspecified: Secondary | ICD-10-CM | POA: Diagnosis not present

## 2019-08-25 DIAGNOSIS — I69318 Other symptoms and signs involving cognitive functions following cerebral infarction: Secondary | ICD-10-CM | POA: Diagnosis not present

## 2019-08-25 DIAGNOSIS — I69391 Dysphagia following cerebral infarction: Secondary | ICD-10-CM | POA: Diagnosis not present

## 2019-08-25 DIAGNOSIS — I4891 Unspecified atrial fibrillation: Secondary | ICD-10-CM | POA: Diagnosis not present

## 2019-08-25 DIAGNOSIS — F0390 Unspecified dementia without behavioral disturbance: Secondary | ICD-10-CM | POA: Diagnosis not present

## 2019-08-25 DIAGNOSIS — R634 Abnormal weight loss: Secondary | ICD-10-CM | POA: Diagnosis not present

## 2019-08-28 DIAGNOSIS — I69318 Other symptoms and signs involving cognitive functions following cerebral infarction: Secondary | ICD-10-CM | POA: Diagnosis not present

## 2019-08-28 DIAGNOSIS — I69391 Dysphagia following cerebral infarction: Secondary | ICD-10-CM | POA: Diagnosis not present

## 2019-08-28 DIAGNOSIS — I4891 Unspecified atrial fibrillation: Secondary | ICD-10-CM | POA: Diagnosis not present

## 2019-08-28 DIAGNOSIS — I509 Heart failure, unspecified: Secondary | ICD-10-CM | POA: Diagnosis not present

## 2019-08-28 DIAGNOSIS — F0390 Unspecified dementia without behavioral disturbance: Secondary | ICD-10-CM | POA: Diagnosis not present

## 2019-08-28 DIAGNOSIS — R634 Abnormal weight loss: Secondary | ICD-10-CM | POA: Diagnosis not present

## 2019-08-29 DIAGNOSIS — R634 Abnormal weight loss: Secondary | ICD-10-CM | POA: Diagnosis not present

## 2019-08-29 DIAGNOSIS — I69391 Dysphagia following cerebral infarction: Secondary | ICD-10-CM | POA: Diagnosis not present

## 2019-08-29 DIAGNOSIS — F0390 Unspecified dementia without behavioral disturbance: Secondary | ICD-10-CM | POA: Diagnosis not present

## 2019-08-29 DIAGNOSIS — I4891 Unspecified atrial fibrillation: Secondary | ICD-10-CM | POA: Diagnosis not present

## 2019-08-29 DIAGNOSIS — I69318 Other symptoms and signs involving cognitive functions following cerebral infarction: Secondary | ICD-10-CM | POA: Diagnosis not present

## 2019-08-29 DIAGNOSIS — I509 Heart failure, unspecified: Secondary | ICD-10-CM | POA: Diagnosis not present

## 2019-09-01 DIAGNOSIS — I69391 Dysphagia following cerebral infarction: Secondary | ICD-10-CM | POA: Diagnosis not present

## 2019-09-01 DIAGNOSIS — I509 Heart failure, unspecified: Secondary | ICD-10-CM | POA: Diagnosis not present

## 2019-09-01 DIAGNOSIS — F0390 Unspecified dementia without behavioral disturbance: Secondary | ICD-10-CM | POA: Diagnosis not present

## 2019-09-01 DIAGNOSIS — I4891 Unspecified atrial fibrillation: Secondary | ICD-10-CM | POA: Diagnosis not present

## 2019-09-01 DIAGNOSIS — R634 Abnormal weight loss: Secondary | ICD-10-CM | POA: Diagnosis not present

## 2019-09-01 DIAGNOSIS — I69318 Other symptoms and signs involving cognitive functions following cerebral infarction: Secondary | ICD-10-CM | POA: Diagnosis not present

## 2019-09-02 DIAGNOSIS — I509 Heart failure, unspecified: Secondary | ICD-10-CM | POA: Diagnosis not present

## 2019-09-02 DIAGNOSIS — I69391 Dysphagia following cerebral infarction: Secondary | ICD-10-CM | POA: Diagnosis not present

## 2019-09-02 DIAGNOSIS — R634 Abnormal weight loss: Secondary | ICD-10-CM | POA: Diagnosis not present

## 2019-09-02 DIAGNOSIS — F0390 Unspecified dementia without behavioral disturbance: Secondary | ICD-10-CM | POA: Diagnosis not present

## 2019-09-02 DIAGNOSIS — I4891 Unspecified atrial fibrillation: Secondary | ICD-10-CM | POA: Diagnosis not present

## 2019-09-02 DIAGNOSIS — I69318 Other symptoms and signs involving cognitive functions following cerebral infarction: Secondary | ICD-10-CM | POA: Diagnosis not present

## 2019-09-03 DIAGNOSIS — I509 Heart failure, unspecified: Secondary | ICD-10-CM | POA: Diagnosis not present

## 2019-09-03 DIAGNOSIS — I69391 Dysphagia following cerebral infarction: Secondary | ICD-10-CM | POA: Diagnosis not present

## 2019-09-03 DIAGNOSIS — R634 Abnormal weight loss: Secondary | ICD-10-CM | POA: Diagnosis not present

## 2019-09-03 DIAGNOSIS — I4891 Unspecified atrial fibrillation: Secondary | ICD-10-CM | POA: Diagnosis not present

## 2019-09-03 DIAGNOSIS — I69318 Other symptoms and signs involving cognitive functions following cerebral infarction: Secondary | ICD-10-CM | POA: Diagnosis not present

## 2019-09-03 DIAGNOSIS — F0390 Unspecified dementia without behavioral disturbance: Secondary | ICD-10-CM | POA: Diagnosis not present

## 2019-09-04 DIAGNOSIS — R634 Abnormal weight loss: Secondary | ICD-10-CM | POA: Diagnosis not present

## 2019-09-04 DIAGNOSIS — I4891 Unspecified atrial fibrillation: Secondary | ICD-10-CM | POA: Diagnosis not present

## 2019-09-04 DIAGNOSIS — F0390 Unspecified dementia without behavioral disturbance: Secondary | ICD-10-CM | POA: Diagnosis not present

## 2019-09-04 DIAGNOSIS — I69318 Other symptoms and signs involving cognitive functions following cerebral infarction: Secondary | ICD-10-CM | POA: Diagnosis not present

## 2019-09-04 DIAGNOSIS — I69391 Dysphagia following cerebral infarction: Secondary | ICD-10-CM | POA: Diagnosis not present

## 2019-09-04 DIAGNOSIS — I509 Heart failure, unspecified: Secondary | ICD-10-CM | POA: Diagnosis not present

## 2019-09-08 DIAGNOSIS — I509 Heart failure, unspecified: Secondary | ICD-10-CM | POA: Diagnosis not present

## 2019-09-08 DIAGNOSIS — I69318 Other symptoms and signs involving cognitive functions following cerebral infarction: Secondary | ICD-10-CM | POA: Diagnosis not present

## 2019-09-08 DIAGNOSIS — F0390 Unspecified dementia without behavioral disturbance: Secondary | ICD-10-CM | POA: Diagnosis not present

## 2019-09-08 DIAGNOSIS — I4891 Unspecified atrial fibrillation: Secondary | ICD-10-CM | POA: Diagnosis not present

## 2019-09-08 DIAGNOSIS — R634 Abnormal weight loss: Secondary | ICD-10-CM | POA: Diagnosis not present

## 2019-09-08 DIAGNOSIS — I69391 Dysphagia following cerebral infarction: Secondary | ICD-10-CM | POA: Diagnosis not present

## 2019-09-10 DIAGNOSIS — F0390 Unspecified dementia without behavioral disturbance: Secondary | ICD-10-CM | POA: Diagnosis not present

## 2019-09-10 DIAGNOSIS — I69391 Dysphagia following cerebral infarction: Secondary | ICD-10-CM | POA: Diagnosis not present

## 2019-09-10 DIAGNOSIS — I4891 Unspecified atrial fibrillation: Secondary | ICD-10-CM | POA: Diagnosis not present

## 2019-09-10 DIAGNOSIS — I509 Heart failure, unspecified: Secondary | ICD-10-CM | POA: Diagnosis not present

## 2019-09-10 DIAGNOSIS — R634 Abnormal weight loss: Secondary | ICD-10-CM | POA: Diagnosis not present

## 2019-09-10 DIAGNOSIS — I69318 Other symptoms and signs involving cognitive functions following cerebral infarction: Secondary | ICD-10-CM | POA: Diagnosis not present

## 2019-09-11 DIAGNOSIS — R634 Abnormal weight loss: Secondary | ICD-10-CM | POA: Diagnosis not present

## 2019-09-11 DIAGNOSIS — I509 Heart failure, unspecified: Secondary | ICD-10-CM | POA: Diagnosis not present

## 2019-09-11 DIAGNOSIS — I69318 Other symptoms and signs involving cognitive functions following cerebral infarction: Secondary | ICD-10-CM | POA: Diagnosis not present

## 2019-09-11 DIAGNOSIS — I4891 Unspecified atrial fibrillation: Secondary | ICD-10-CM | POA: Diagnosis not present

## 2019-09-11 DIAGNOSIS — I69391 Dysphagia following cerebral infarction: Secondary | ICD-10-CM | POA: Diagnosis not present

## 2019-09-11 DIAGNOSIS — F0390 Unspecified dementia without behavioral disturbance: Secondary | ICD-10-CM | POA: Diagnosis not present

## 2019-09-12 DIAGNOSIS — F0390 Unspecified dementia without behavioral disturbance: Secondary | ICD-10-CM | POA: Diagnosis not present

## 2019-09-12 DIAGNOSIS — I69391 Dysphagia following cerebral infarction: Secondary | ICD-10-CM | POA: Diagnosis not present

## 2019-09-12 DIAGNOSIS — I69318 Other symptoms and signs involving cognitive functions following cerebral infarction: Secondary | ICD-10-CM | POA: Diagnosis not present

## 2019-09-12 DIAGNOSIS — I509 Heart failure, unspecified: Secondary | ICD-10-CM | POA: Diagnosis not present

## 2019-09-12 DIAGNOSIS — I4891 Unspecified atrial fibrillation: Secondary | ICD-10-CM | POA: Diagnosis not present

## 2019-09-12 DIAGNOSIS — R634 Abnormal weight loss: Secondary | ICD-10-CM | POA: Diagnosis not present

## 2019-09-13 DIAGNOSIS — F0390 Unspecified dementia without behavioral disturbance: Secondary | ICD-10-CM | POA: Diagnosis not present

## 2019-09-13 DIAGNOSIS — I69318 Other symptoms and signs involving cognitive functions following cerebral infarction: Secondary | ICD-10-CM | POA: Diagnosis not present

## 2019-09-13 DIAGNOSIS — I69391 Dysphagia following cerebral infarction: Secondary | ICD-10-CM | POA: Diagnosis not present

## 2019-09-13 DIAGNOSIS — I4891 Unspecified atrial fibrillation: Secondary | ICD-10-CM | POA: Diagnosis not present

## 2019-09-13 DIAGNOSIS — I509 Heart failure, unspecified: Secondary | ICD-10-CM | POA: Diagnosis not present

## 2019-09-13 DIAGNOSIS — R634 Abnormal weight loss: Secondary | ICD-10-CM | POA: Diagnosis not present

## 2019-09-21 DEATH — deceased
